# Patient Record
Sex: Male | Born: 1950
Health system: Southern US, Community
[De-identification: ages and names within clinical notes are randomized; demographics above are authoritative.]

## PROBLEM LIST (undated history)

## (undated) DIAGNOSIS — E669 Obesity, unspecified: Secondary | ICD-10-CM

## (undated) DIAGNOSIS — M1990 Unspecified osteoarthritis, unspecified site: Secondary | ICD-10-CM

## (undated) DIAGNOSIS — M549 Dorsalgia, unspecified: Secondary | ICD-10-CM

## (undated) DIAGNOSIS — R12 Heartburn: Secondary | ICD-10-CM

## (undated) DIAGNOSIS — G4733 Obstructive sleep apnea (adult) (pediatric): Secondary | ICD-10-CM

## (undated) DIAGNOSIS — R079 Chest pain, unspecified: Secondary | ICD-10-CM

## (undated) DIAGNOSIS — M2550 Pain in unspecified joint: Secondary | ICD-10-CM

## (undated) DIAGNOSIS — J189 Pneumonia, unspecified organism: Secondary | ICD-10-CM

## (undated) DIAGNOSIS — K429 Umbilical hernia without obstruction or gangrene: Secondary | ICD-10-CM

## (undated) DIAGNOSIS — I480 Paroxysmal atrial fibrillation: Secondary | ICD-10-CM

## (undated) DIAGNOSIS — N400 Enlarged prostate without lower urinary tract symptoms: Secondary | ICD-10-CM

## (undated) DIAGNOSIS — Z86010 Personal history of colonic polyps: Secondary | ICD-10-CM

## (undated) DIAGNOSIS — R7303 Prediabetes: Secondary | ICD-10-CM

## (undated) DIAGNOSIS — E785 Hyperlipidemia, unspecified: Secondary | ICD-10-CM

## (undated) DIAGNOSIS — R2689 Other abnormalities of gait and mobility: Secondary | ICD-10-CM

## (undated) DIAGNOSIS — I2510 Atherosclerotic heart disease of native coronary artery without angina pectoris: Secondary | ICD-10-CM

## (undated) DIAGNOSIS — Z87442 Personal history of urinary calculi: Secondary | ICD-10-CM

## (undated) DIAGNOSIS — R0602 Shortness of breath: Secondary | ICD-10-CM

## (undated) DIAGNOSIS — I10 Essential (primary) hypertension: Secondary | ICD-10-CM

## (undated) DIAGNOSIS — K219 Gastro-esophageal reflux disease without esophagitis: Secondary | ICD-10-CM

## (undated) DIAGNOSIS — R5383 Other fatigue: Secondary | ICD-10-CM

## (undated) DIAGNOSIS — K5900 Constipation, unspecified: Secondary | ICD-10-CM

## (undated) DIAGNOSIS — E039 Hypothyroidism, unspecified: Secondary | ICD-10-CM

## (undated) DIAGNOSIS — K439 Ventral hernia without obstruction or gangrene: Secondary | ICD-10-CM

## (undated) HISTORY — DX: Other abnormalities of gait and mobility: R26.89

## (undated) HISTORY — DX: Dorsalgia, unspecified: M54.9

## (undated) HISTORY — DX: Pain in unspecified joint: M25.50

## (undated) HISTORY — DX: Other fatigue: R53.83

## (undated) HISTORY — DX: Essential (primary) hypertension: I10

## (undated) HISTORY — DX: Chest pain, unspecified: R07.9

## (undated) HISTORY — PX: CARDIAC CATHETERIZATION: SHX172

## (undated) HISTORY — PX: TONSILLECTOMY: SUR1361

## (undated) HISTORY — DX: Constipation, unspecified: K59.00

## (undated) HISTORY — DX: Paroxysmal atrial fibrillation: I48.0

## (undated) HISTORY — PX: OTHER SURGICAL HISTORY: SHX169

## (undated) HISTORY — DX: Benign prostatic hyperplasia without lower urinary tract symptoms: N40.0

## (undated) HISTORY — DX: Obesity, unspecified: E66.9

## (undated) HISTORY — DX: Unspecified osteoarthritis, unspecified site: M19.90

## (undated) HISTORY — DX: Hypothyroidism, unspecified: E03.9

## (undated) HISTORY — PX: WISDOM TOOTH EXTRACTION: SHX21

## (undated) HISTORY — DX: Shortness of breath: R06.02

## (undated) HISTORY — DX: Personal history of colonic polyps: Z86.010

---

## 1988-12-29 HISTORY — PX: TONSILLECTOMY: SHX5217

## 1989-12-29 HISTORY — PX: LAMINECTOMY: SHX219

## 2005-12-29 DIAGNOSIS — Z860100 Personal history of colon polyps, unspecified: Secondary | ICD-10-CM

## 2005-12-29 HISTORY — DX: Personal history of colonic polyps: Z86.010

## 2005-12-29 HISTORY — DX: Personal history of colon polyps, unspecified: Z86.0100

## 2005-12-29 LAB — HM COLONOSCOPY: HM Colonoscopy: NORMAL

## 2009-09-04 ENCOUNTER — Ambulatory Visit: Payer: Self-pay | Admitting: Internal Medicine

## 2009-09-04 DIAGNOSIS — I10 Essential (primary) hypertension: Secondary | ICD-10-CM | POA: Insufficient documentation

## 2009-09-04 DIAGNOSIS — Z860100 Personal history of colon polyps, unspecified: Secondary | ICD-10-CM | POA: Insufficient documentation

## 2009-09-04 DIAGNOSIS — R339 Retention of urine, unspecified: Secondary | ICD-10-CM | POA: Insufficient documentation

## 2009-09-04 DIAGNOSIS — E039 Hypothyroidism, unspecified: Secondary | ICD-10-CM | POA: Insufficient documentation

## 2009-09-04 DIAGNOSIS — E785 Hyperlipidemia, unspecified: Secondary | ICD-10-CM | POA: Insufficient documentation

## 2009-09-04 DIAGNOSIS — E669 Obesity, unspecified: Secondary | ICD-10-CM | POA: Insufficient documentation

## 2009-09-04 DIAGNOSIS — M129 Arthropathy, unspecified: Secondary | ICD-10-CM | POA: Insufficient documentation

## 2009-09-05 ENCOUNTER — Encounter (INDEPENDENT_AMBULATORY_CARE_PROVIDER_SITE_OTHER): Payer: Self-pay | Admitting: *Deleted

## 2009-09-05 LAB — CONVERTED CEMR LAB
Bilirubin Urine: NEGATIVE
Hemoglobin, Urine: NEGATIVE
Ketones, ur: NEGATIVE mg/dL
Leukocytes, UA: NEGATIVE
Nitrite: NEGATIVE
PSA: 2.07 ng/mL (ref 0.10–4.00)
Specific Gravity, Urine: >=1.03 (ref 1.000–1.030)
TSH: 3.6 microintl units/mL (ref 0.35–5.50)
Total Protein, Urine: NEGATIVE mg/dL
Urine Glucose: NEGATIVE mg/dL
Urobilinogen, UA: 0.2 (ref 0.0–1.0)
pH: 5.5 (ref 5.0–8.0)

## 2009-11-20 ENCOUNTER — Ambulatory Visit (HOSPITAL_BASED_OUTPATIENT_CLINIC_OR_DEPARTMENT_OTHER): Admission: RE | Admit: 2009-11-20 | Discharge: 2009-11-20 | Payer: Self-pay | Admitting: Orthopaedic Surgery

## 2010-04-29 ENCOUNTER — Ambulatory Visit: Payer: Self-pay | Admitting: Internal Medicine

## 2010-04-29 LAB — CONVERTED CEMR LAB
ALT: 28 units/L (ref 0–53)
AST: 24 units/L (ref 0–37)
Albumin: 4.3 g/dL (ref 3.5–5.2)
Alkaline Phosphatase: 65 units/L (ref 39–117)
BUN: 13 mg/dL (ref 6–23)
Basophils Absolute: 0 10*3/uL (ref 0.0–0.1)
Basophils Relative: 0.8 % (ref 0.0–3.0)
Bilirubin Urine: NEGATIVE
Bilirubin, Direct: 0.1 mg/dL (ref 0.0–0.3)
CO2: 27 meq/L (ref 19–32)
Calcium: 9.7 mg/dL (ref 8.4–10.5)
Chloride: 107 meq/L (ref 96–112)
Cholesterol: 210 mg/dL — ABNORMAL HIGH (ref 0–200)
Creatinine, Ser: 0.8 mg/dL (ref 0.4–1.5)
Direct LDL: 154.7 mg/dL
Eosinophils Absolute: 0.1 10*3/uL (ref 0.0–0.7)
Eosinophils Relative: 2.5 % (ref 0.0–5.0)
GFR calc non Af Amer: 105.96 mL/min (ref >60)
Glucose, Bld: 95 mg/dL (ref 70–99)
HCT: 42.1 % (ref 39.0–52.0)
HDL: 42.5 mg/dL (ref >39.00)
Hemoglobin, Urine: NEGATIVE
Hemoglobin: 15 g/dL (ref 13.0–17.0)
Ketones, ur: NEGATIVE mg/dL
Leukocytes, UA: NEGATIVE
Lipase: 24 units/L (ref 11.0–59.0)
Lymphocytes Relative: 34.6 % (ref 12.0–46.0)
Lymphs Abs: 1.7 10*3/uL (ref 0.7–4.0)
MCHC: 35.7 g/dL (ref 30.0–36.0)
MCV: 89.2 fL (ref 78.0–100.0)
Monocytes Absolute: 0.5 10*3/uL (ref 0.1–1.0)
Monocytes Relative: 10.7 % (ref 3.0–12.0)
Neutro Abs: 2.5 10*3/uL (ref 1.4–7.7)
Neutrophils Relative %: 51.4 % (ref 43.0–77.0)
Nitrite: NEGATIVE
Platelets: 220 10*3/uL (ref 150.0–400.0)
Potassium: 4.2 meq/L (ref 3.5–5.1)
RBC: 4.72 M/uL (ref 4.22–5.81)
RDW: 12.4 % (ref 11.5–14.6)
Sodium: 141 meq/L (ref 135–145)
Specific Gravity, Urine: 1.025 (ref 1.000–1.030)
TSH: 3.82 microintl units/mL (ref 0.35–5.50)
Total Bilirubin: 0.9 mg/dL (ref 0.3–1.2)
Total CHOL/HDL Ratio: 5
Total Protein, Urine: NEGATIVE mg/dL
Total Protein: 6.7 g/dL (ref 6.0–8.3)
Triglycerides: 67 mg/dL (ref 0.0–149.0)
Urine Glucose: NEGATIVE mg/dL
Urobilinogen, UA: 0.2 (ref 0.0–1.0)
VLDL: 13.4 mg/dL (ref 0.0–40.0)
WBC: 5 10*3/uL (ref 4.5–10.5)
pH: 5 (ref 5.0–8.0)

## 2010-05-24 ENCOUNTER — Telehealth: Payer: Self-pay | Admitting: Internal Medicine

## 2010-10-04 ENCOUNTER — Telehealth: Payer: Self-pay | Admitting: Internal Medicine

## 2010-11-27 ENCOUNTER — Telehealth: Payer: Self-pay | Admitting: Internal Medicine

## 2010-12-09 ENCOUNTER — Encounter: Payer: Self-pay | Admitting: Internal Medicine

## 2010-12-09 ENCOUNTER — Ambulatory Visit: Payer: Self-pay | Admitting: Internal Medicine

## 2011-01-22 LAB — URINALYSIS, ROUTINE W REFLEX MICROSCOPIC
Bilirubin Urine: NEGATIVE
Hgb urine dipstick: NEGATIVE
Ketones, ur: NEGATIVE mg/dL
Nitrite: NEGATIVE
Protein, ur: NEGATIVE mg/dL
Specific Gravity, Urine: 1.025 (ref 1.005–1.030)
Urine Glucose, Fasting: NEGATIVE mg/dL
Urobilinogen, UA: 1 mg/dL (ref 0.0–1.0)
pH: 6 (ref 5.0–8.0)

## 2011-01-22 LAB — COMPREHENSIVE METABOLIC PANEL
ALT: 36 U/L (ref 0–53)
AST: 29 U/L (ref 0–37)
Albumin: 4 g/dL (ref 3.5–5.2)
Alkaline Phosphatase: 67 U/L (ref 39–117)
BUN: 14 mg/dL (ref 6–23)
CO2: 26 mEq/L (ref 19–32)
Calcium: 9.5 mg/dL (ref 8.4–10.5)
Chloride: 110 mEq/L (ref 96–112)
Creatinine, Ser: 0.84 mg/dL (ref 0.4–1.5)
GFR calc Af Amer: >60 mL/min (ref >60)
GFR calc non Af Amer: >60 mL/min (ref >60)
Glucose, Bld: 88 mg/dL (ref 70–99)
Potassium: 3.8 mEq/L (ref 3.5–5.1)
Sodium: 143 mEq/L (ref 135–145)
Total Bilirubin: 0.5 mg/dL (ref 0.3–1.2)
Total Protein: 6.9 g/dL (ref 6.0–8.3)

## 2011-01-22 LAB — SURGICAL PCR SCREEN
MRSA, PCR: NEGATIVE
Staphylococcus aureus: NEGATIVE

## 2011-01-22 LAB — APTT: aPTT: 39 seconds — ABNORMAL HIGH (ref 24–37)

## 2011-01-22 LAB — CBC
HCT: 42.2 % (ref 39.0–52.0)
Hemoglobin: 15.1 g/dL (ref 13.0–17.0)
MCH: 31.3 pg (ref 26.0–34.0)
MCHC: 35.8 g/dL (ref 30.0–36.0)
MCV: 87.4 fL (ref 78.0–100.0)
Platelets: 166 10*3/uL (ref 150–400)
RBC: 4.83 MIL/uL (ref 4.22–5.81)
RDW: 12.1 % (ref 11.5–15.5)
WBC: 5.6 10*3/uL (ref 4.0–10.5)

## 2011-01-22 LAB — PROTIME-INR
INR: 1.09 (ref 0.00–1.49)
Prothrombin Time: 14.3 seconds (ref 11.6–15.2)

## 2011-01-28 NOTE — Progress Notes (Signed)
Summary: levothyroxine  Phone Note Refill Request Message from:  Fax from Pharmacy on October 04, 2010 1:49 PM  Refills Requested: Medication #1:  LEVOTHYROXINE SODIUM 125 MCG TABS take 1 by mouth qd   Last Refilled: 07/22/2010 Pt is requesting 90 days due to insurance. Ps send to [facility]  Initial call taken by: Orlan Leavens RMA,  October 04, 2010 1:50 PM    Prescriptions: LEVOTHYROXINE SODIUM 125 MCG TABS (LEVOTHYROXINE SODIUM) take 1 by mouth qd  #90 x 1   Entered by:   Orlan Leavens RMA   Authorized by:   Newt Lukes MD   Signed by:   Orlan Leavens RMA on 10/04/2010   Method used:   Electronically to        [facility]* (retail)       [address]       [address] Shipping/mailing       [place], [place]  [address]       Ph: [phone]       Fax: [phone]   RxID:   [id number]

## 2011-01-28 NOTE — Assessment & Plan Note (Signed)
Summary: f/u appt per wife,not cpx/#/cd   Vital Signs:  Patient profile:   60 year old male Height:      72 inches (182.88 cm) Weight:      259.8 pounds (118.09 kg) O2 Sat:      96 % on Room air Temp:     98.3 degrees F (36.83 degrees C) oral Pulse rate:   62 / minute BP sitting:   126 / 84  (left arm) Cuff size:   large  Vitals Entered By: Orlan Leavens (Apr 29, 2010 9:55 AM)  O2 Flow:  Room air CC: CPX/follow-up visit/ last weekend had some abdominal pain x's 48 hours, Abdominal pain Is Patient Diabetic? No Pain Assessment Patient in pain? no        Primary Care Provider:  Newt Lukes MD  CC:  CPX/follow-up visit/ last weekend had some abdominal pain x's 48 hours and Abdominal pain.  History of Present Illness: patient is here today for annual physical. Patient feels well and has no complaints today -  Abdominal Pain      This is a 60 year old man who presents with Abdominal pain.  The symptoms began 1 week ago and lasted 48 hours before resolution.  On a scale of 1 to 10, the intensity is described as a 7 at worst, currently no pain at all.  onset while at tailgating party last weekend, sudden onset without warning - no hx similar symptoms or GERD.  The patient denies nausea, vomiting, diarrhea, constipation, melena, hematochezia, anorexia, and hematemesis.  The location of the pain is epigastric.  The pain is described as constant, sharp, and radiating to the back.  The patient denies the following symptoms: fever, weight loss, dysuria, and chest pain.  The pain is worse with food and movement.  The pain is not better with antacids or aspirin use.    also asks ?celebrex to help with arthritis pain-  pain located in knees and back -  uses occ tylenol or alleve -  most help with relief from ibuprofen when pain is bad/flared up  Preventive Screening-Counseling & Management  Alcohol-Tobacco     Alcohol drinks/day: <1     Alcohol Counseling: not indicated; use of  alcohol is not excessive or problematic     Smoking Status: never  Caffeine-Diet-Exercise     Exercise Counseling: to improve exercise regimen     Depression Counseling: not indicated; screening negative for depression  Safety-Violence-Falls     Seat Belt Counseling: not indicated; patient wears seat belts     Helmet Counseling: not applicable     Firearm Counseling: not applicable     Smoke Detector Counseling: no     Violence Counseling: not indicated; no violence risk noted     Fall Risk Counseling: not indicated; no significant falls noted  Clinical Review Panels:  Prevention   Last Colonoscopy:  Results: Normal. (12/29/2005)   Last PSA:  2.07 (09/04/2009)  Immunizations   Last Tetanus Booster:  Td (09/04/2009)   Current Medications (verified): 1)  Levothyroxine Sodium 125 Mcg Tabs (Levothyroxine Sodium) .... Take 1 By Mouth Qd 2)  Aspirin 81 Mg Tabs (Aspirin) .... Take 1 By Mouth Qd 3)  Vitamin D3 1000 Unit Tabs (Cholecalciferol) .... Take 1 By Mouth Qd 4)  Fish Oil 1250 Mg Caps (Omega-3 Fatty Acids) .... Take 1 By Mouth Qd 5)  Multivitamins  Tabs (Multiple Vitamin) .... Take 1 Po Qd  Allergies (verified): No Known Drug Allergies  Past History:  Past medical, surgical, family and social histories (including risk factors) reviewed, and no changes noted (except as noted below).  Past Medical History: Colonic polyps, hx of - (2007 - rec repeat colo 2012 by [facility]) Hypertension hypothyroid dyslipidemia arthritis, knees  MD rooster: ortho - daldorf  Past Surgical History: Tonsillectomy (2004) laminectomy (1991) Arthroscopic knee surgery (R -2003, L - 2010)  Family History: Reviewed history from 09/04/2009 and no changes required. Family History of Arthritis (grandparent & parents) Family History Diabetes 1st degree relative (parents) Family History Hypertension (parents) Heart disease(parent & grandparent) ALS (m grandfather) mother living, age 29, with  advanced dementia for age Dad living in his 62s, status post CABG in his 66s  Social History: Reviewed history from 09/04/2009 and no changes required. 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 Rare, social alcohol use    Review of Systems       see HPI above. I have reviewed all other systems and they were negative.   Physical Exam  General:  overweight-appearing.  alert, well-developed, well-nourished, and cooperative to examination.  wife at side  Eyes:  vision grossly intact; pupils equal, round and reactive to light.  conjunctiva and lids normal.    Ears:  normal pinnae bilaterally, without erythema, swelling, or tenderness to palpation. TMs clear, without effusion, or cerumen impaction. Hearing grossly normal bilaterally  Mouth:  teeth and gums in good repair; mucous membranes moist, without lesions or ulcers. oropharynx clear without exudate, no erythema.  Neck:  supple, full ROM, no masses, no thyromegaly; no thyroid nodules or tenderness. no JVD or carotid bruits.   Lungs:  normal respiratory effort, no intercostal retractions or use of accessory muscles; normal breath sounds bilaterally - no crackles and no wheezes.    Heart:  normal rate, regular rhythm, no murmur, and no rub. BLE without edema. normal DP pulses and normal cap refill in all 4 extremities    Abdomen:  protuberant, soft, non-tender, normal bowel sounds, no distention; no masses and no appreciable hepatomegaly or splenomegaly.   Rectal:  defer Msk:  no gross deformities or joint effusions  Neurologic:  alert & oriented X3 and cranial nerves II-XII symetrically intact.  strength normal in all extremities, sensation intact to light touch, and gait normal. speech fluent without dysarthria or aphasia; follows commands with good comprehension.  Skin:  tanned, sunburned rash to abd and back - no other rashes, vesicles, ulcers, or  erythema. No nodules or irregularity to palpation.  no shingles or bruising on abd or back Cervical Nodes:  No lymphadenopathy noted Axillary Nodes:  No palpable lymphadenopathy Psych:  Oriented X3, memory intact for recent and remote, normally interactive, good eye contact, not anxious appearing, not depressed appearing, and not agitated.      Impression & Recommendations:  Problem # 1:  PHYSICAL EXAMINATION (ICD-V70.0) Patient has been counseled on age-appropriate routine health concerns for screening and prevention. These are reviewed and up-to-date. Immunizations are up-to-date or declined. Labs ordered and ECG reviewed.  Orders: EKG w/ Interpretation (93000) TLB-Lipid Panel (80061-LIPID) TLB-BMP (Basic Metabolic Panel-BMET) (80048-METABOL) TLB-CBC Platelet - w/Differential (85025-CBCD) TLB-Hepatic/Liver Function Pnl (80076-HEPATIC) TLB-TSH (Thyroid Stimulating Hormone) (84443-TSH) TLB-Udip w/ Micro (81001-URINE)  Problem # 2:  ABDOMINAL PAIN (ICD-789.00) none at this time - exam benign EKG w/o ischemic changes (done for CPX as above) - will look at labs as above for CPX and include lipase r/o panc  flare if labs normal, as pain resolved, try OTC PPI x 2 weeks - if abn labs or recurrent pain, pursue further imagaing as needed  Orders: TLB-Lipase (83690-LIPASE)  Problem # 3:  ARTHRITIS (ICD-716.90)  ok to use celebrex as needed - new e-rx done  Orders: Prescription Created Electronically [phone])  Problem # 4:  HYPOTHYROIDISM (ICD-244.9)  His updated medication list for this problem includes:    Levothyroxine Sodium 125 Mcg Tabs (Levothyroxine sodium) .Marland Kitchen... Take 1 by mouth qd  Labs Reviewed: TSH: 3.60 (09/04/2009)     Problem # 5:  HYPERTENSION (ICD-401.9)  BP today: 126/84 Prior BP: 144/90 (09/04/2009)  Problem # 6:  DYSLIPIDEMIA (ICD-272.4)  Complete Medication List: 1)  Levothyroxine Sodium 125 Mcg Tabs (Levothyroxine sodium) .... Take 1 by mouth qd 2)  Aspirin  81 Mg Tabs (Aspirin) .... Take 1 by mouth qd 3)  Vitamin D3 1000 Unit Tabs (Cholecalciferol) .... Take 1 by mouth qd 4)  Fish Oil 1200 Mg Caps (Omega-3 fatty acids) .Marland Kitchen.. 1 by mouth once daily 5)  Multivitamins Tabs (Multiple vitamin) .... Take 1 po qd 6)  Celebrex 200 Mg Caps (Celecoxib) .Marland Kitchen.. 1 by mouth once daily 7)  Prilosec Otc 20 Mg Tbec (Omeprazole magnesium) .Marland Kitchen.. 1 by mouth once daily x 2 weeks, then as needed  Patient Instructions: 1)  it was good to see you today. 2)  EKG does not show any heart problems - 3)  test(s) ordered today - your results will be posted on the phone tree for review in 48-72 hours from the time of test completion; call [phone] and enter your 9 digit MRN (listed above on this page, just below your name); if any changes need to be made or there are abnormal results, you will be contacted directly.  4)  If labs are normal, will treat for probable stomach cause of previous pain symptoms with Prilosec OTC once daily x 2 weeks, then as needed (as listed below) - If any recurrent abdominal pain, call us for further testing as needed (or we will notify you if further testing indicated by any lab abnormality) 5)  try celebrex for arthritis pain as discussed - your prescription has been electronically submitted to your pharmacy. Please take as directed. Contact our office if you believe you're having problems with the medication(s).  Prescriptions: CELEBREX 200 MG CAPS (CELECOXIB) 1 by mouth once daily  #30 x 2   Entered and Authorized by:   Newt Lukes MD   Signed by:   Newt Lukes MD on 04/29/2010   Method used:   Electronically to        [facility]* (retail)       [address]       [address] Shipping/mailing       [place], [place]  [address]       Ph: [phone]       Fax: [phone]   RxID:   [id number]

## 2011-01-28 NOTE — Letter (Signed)
Summary: Results Follow-up Letter  [facility]  [address] [place], [place] [address]   Phone: [phone]  Fax: [phone]    09/05/2009  [address] [place], [place]  [address]  Dear Mr. HAVERSTOCK,   The following are the results of your recent test 09/04/09  Test     Result     TSH               Normal PSA               Normal Urine             Normal No obvious prostate, kidney, or thyroid problems here if continued symtoms with urination will refer you to Urologist for further evaluation. Attached a copy of your bloodwork for your records.    Sincerely,  Westley Hummer  [facility]

## 2011-01-28 NOTE — Progress Notes (Signed)
Summary: med change  Phone Note From Pharmacy   Caller: [facility]* Call For: MD  Summary of Call: Have rx for celebrex 200mg . pt want to know if md could switch to mobic which comes in a generic. Pls advise ? Initial call taken by: Orlan Leavens,  May 24, 2010 3:14 PM  Follow-up for Phone Call        ok- e-rx done Follow-up by: Newt Lukes MD,  May 24, 2010 3:39 PM    New/Updated Medications: MELOXICAM 15 MG TABS (MELOXICAM) 1 by mouth once daily as needed for pain Prescriptions: MELOXICAM 15 MG TABS (MELOXICAM) 1 by mouth once daily as needed for pain  #30 x 3   Entered and Authorized by:   Newt Lukes MD   Signed by:   Newt Lukes MD on 05/24/2010   Method used:   Electronically to        [facility]* (retail)       [address]       [address] Shipping/mailing       [place], [place]  [address]       Ph: [phone]       Fax: [phone]   RxID:   [id number]

## 2011-01-28 NOTE — Progress Notes (Signed)
Summary: 90 day meloxicam  Phone Note From Pharmacy   Caller: [facility]* Call For: Dr. Felicity Coyer  Request: Resend Prescription Summary of Call: Recieved 30 day supply on meloxicam. Insurance requires 90. Pls send new rx. Sent to pharm Initial call taken by: Orlan Leavens RMA,  November 27, 2010 10:42 AM    Prescriptions: MELOXICAM 15 MG TABS (MELOXICAM) 1 by mouth once daily as needed for pain  #90 x 0   Entered by:   Orlan Leavens RMA   Authorized by:   Newt Lukes MD   Signed by:   Orlan Leavens RMA on 11/27/2010   Method used:   Electronically to        [facility]* (retail)       [address]       [address] Shipping/mailing       [place], [place]  [address]       Ph: [phone]       Fax: [phone]   RxID:   [id number]

## 2011-01-28 NOTE — Assessment & Plan Note (Signed)
Summary: NEW/ UMR / NWS  $50   Vital Signs:  Patient profile:   60 year old male Height:      72 inches (182.88 cm) Weight:      249.8 pounds (113.55 kg) BMI:     34.00 O2 Sat:      97 % Temp:     98.7 degrees F (37.06 degrees C) oral Pulse rate:   69 / minute BP sitting:   144 / 90  (left arm) Cuff size:   large  Vitals Entered By: Orlan Leavens (September 04, 2009 3:13 PM) CC: New patient Is Patient Diabetic? No Pain Assessment Patient in pain? no        Primary Care Provider:  Newt Lukes MD  CC:  New patient.  History of Present Illness: here today to establish care Has moved to the [place] area from [place] in June 2010 to be closer to grand kids and family Patient intake form reviewed but no old records available today  Hypothyroidism. Reports he is needing refills soon on his medication.  Last TSH approximally 5 months ago, normal by his report no recent dose changes in past year, but increased from 100-125 approximately one year ago No hair or skin changes, weight stable  Complains of very occasional urinary hesitation Symptoms occur once every 2-3 weeks Associated with straining and difficulty passing urine, but no pain within penis, bladder, or abdomen No hematuria No history of known prostate problems and reports normal. PSA in the past No fever or dysuria Does not feel troubled by the symptoms on a regular basis  Reports history of borderline high cholesterol and borderline diabetes, but never formally diagnosed or on medications for same. Also reports "borderline high blood pressure" but never on medication for same  Preventive Screening-Counseling & Management  Alcohol-Tobacco     Smoking Status: never  Clinical Review Panels:  Immunizations   Last Tetanus Booster:  Td (09/04/2009)   Current Medications (verified): 1)  Levothyroxine Sodium 125 Mcg Tabs (Levothyroxine Sodium) .... Take 1 By Mouth Qd 2)  Aspirin 81 Mg Tabs  (Aspirin) .... Take 1 By Mouth Qd 3)  Vitamin D3 1000 Unit Tabs (Cholecalciferol) .... Take 1 By Mouth Qd 4)  Fish Oil 1000 Mg Caps (Omega-3 Fatty Acids) .... Take 1 By Mouth Qd 5)  Multivitamins  Tabs (Multiple Vitamin) .... Take 1 Po Qd  Allergies (verified): No Known Drug Allergies  Past History:  Past Medical History: Colonic polyps, hx of - (2007 - rec repeat colo 2012 by [facility]) Hypertension hypothyroid dyslipidemia  Past Surgical History: Tonsillectomy (2004) laminectomy (1991) Arthroscopic knee surgery (2003)  Family History: Family History of Arthritis (grandparent & parents) Family History Diabetes 1st degree relative (parents) Family History Hypertension (parents) Heart disease(parent & grandparent) ALS (m grandfather) mother living, age 21, with advanced dementia for age Dad living in his 17s, status post CABG in his 49s  Social History: 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 Rare, social alcohol use Smoking Status:  never  Review of Systems       see HPI above. I have reviewed all other systems and they were negative.   Physical Exam  General:  overweight-appearing.  alert, well-developed, well-nourished, and cooperative to examination.    Eyes:  vision grossly intact; pupils equal, round and reactive to light.  conjunctiva and lids normal.    Lungs:  normal respiratory  effort, no intercostal retractions or use of accessory muscles; normal breath sounds bilaterally - no crackles and no wheezes.    Heart:  normal rate, regular rhythm, no murmur, and no rub. BLE without edema.  Abdomen:  protuberant, soft, non-tender, normal bowel sounds, no distention; no masses and no appreciable hepatomegaly or splenomegaly.   Prostate:  defer Msk:  no gross deformities or joint effusions Neurologic:  alert & oriented X3 and cranial nerves II-XII symetrically intact.  strength normal in  all extremities, sensation intact to light touch, and gait normal. speech fluent without dysarthria or aphasia; follows commands with good comprehension.  Skin:  diffuse ruddy/tan coloration of skin -no specific petechia or rash ulceration, or bruising Psych:  Oriented X3, memory intact for recent and remote, normally interactive, good eye contact, not anxious appearing, not depressed appearing, and not agitated.      Impression & Recommendations:  Problem # 1:  HYPOTHYROIDISM (ICD-244.9)  plan to continue medications as ongoing if TSH normal Check labs today and send refills His updated medication list for this problem includes:    Levothyroxine Sodium 125 Mcg Tabs (Levothyroxine sodium) .Marland Kitchen... Take 1 by mouth qd  Orders: TLB-TSH (Thyroid Stimulating Hormone) 409 120 [id number]) Prescription Created Electronically [phone])  Problem # 2:  INCOMPLETE BLADDER EMPTYING (ICD-788.21) occasional symptomatic urinary complaints, with possible BPH Check urine to rule out infection, and PSA If either are abnormal, consider urology eval  or if increasing, worsening symptoms will refer sooner than later Will hold off on emperic treatment of BPH at this time pending these results Orders: TLB-Udip w/ Micro (81001-URINE) TLB-PSA (Prostate Specific Antigen) (84153-PSA)  Problem # 3:  HYPERTENSION (ICD-401.9) history of borderline high blood pressure per patient report Will plan to recheck this value and begin treatment if remains elevated next visit Check physical, labs and await results of old records from prior PCP Recommend exercise, weight control, and diet BP today: 144/90  Problem # 4:  DYSLIPIDEMIA (ICD-272.4) takes omega-3 supplements, but never on prescription meds Plan recheck fasting lipid at next physical appointment  Problem # 5:  OBESITY (ICD-278.00) discussed with patient need to watch weight and exercise, especially in retirement Patient verbalizes understanding and agrees Ht: 72  (09/04/2009)   Wt: 249.8 (09/04/2009)   BMI: 34.00 (09/04/2009)  Complete Medication List: 1)  Levothyroxine Sodium 125 Mcg Tabs (Levothyroxine sodium) .... Take 1 by mouth qd 2)  Aspirin 81 Mg Tabs (Aspirin) .... Take 1 by mouth qd 3)  Vitamin D3 1000 Unit Tabs (Cholecalciferol) .... Take 1 by mouth qd 4)  Fish Oil 1000 Mg Caps (Omega-3 fatty acids) .... Take 1 by mouth qd 5)  Multivitamins Tabs (Multiple vitamin) .... Take 1 po qd  Other Orders: Tetanus Toxoid w/Dx (78295) Admin 1st Vaccine (62130)  Patient Instructions: 1)  will check your TSH (thyroid marker) and PSA + urine today - if these are normal, we will mail your results -  if abnormal, we will contact you with plans for other testing or treatment 2)  refills on thyroid medication send electronically to your pharmacy - call if problems 3)  Please schedule a follow-up appointment in 6 months, fasting for physical lab work. Also welcome to call sooner if any new problems arise 4)  Limit your Sodium (Salt). 5)  It is important that you exercise regularly at least 20 minutes 5 times a week. If you develop chest pain, have severe difficulty breathing, or feel very tired , stop exercising immediately and seek medical attention. 6)  You need to lose weight. Consider a lower calorie diet and regular exercise.  Prescriptions: LEVOTHYROXINE SODIUM 125 MCG TABS (LEVOTHYROXINE SODIUM) take 1 by mouth qd  #30 x 5   Entered and Authorized by:   Newt Lukes MD   Signed by:   Newt Lukes MD on 09/04/2009   Method used:   Electronically to        [facility]* (retail)       [address]       [address] Shipping/mailing       [place], [place]  [address]       Ph: [phone]       Fax: [phone]   RxID:   [id number]    Colonoscopy  Procedure date:  12/29/2005  Findings:      Results: Normal.     Immunizations Administered:  Tetanus Vaccine:    Vaccine Type: Td    Site: left  deltoid    Mfr: [employer] Pasteur    Dose: 0.5 ml    Route: IM    Given by: Orlan Leavens    Exp. Date: 04/11/2011    Lot #: [id number]    VIS given: 09/04/09

## 2011-01-29 ENCOUNTER — Inpatient Hospital Stay (HOSPITAL_COMMUNITY): Payer: 59

## 2011-01-29 ENCOUNTER — Inpatient Hospital Stay (HOSPITAL_COMMUNITY)
Admission: RE | Admit: 2011-01-29 | Discharge: 2011-02-01 | DRG: 470 | Disposition: A | Discharge status: Home Health | Payer: 59 | Attending: Orthopedic Surgery | Admitting: Orthopedic Surgery

## 2011-01-29 DIAGNOSIS — E039 Hypothyroidism, unspecified: Secondary | ICD-10-CM | POA: Diagnosis present

## 2011-01-29 DIAGNOSIS — M169 Osteoarthritis of hip, unspecified: Principal | ICD-10-CM | POA: Diagnosis present

## 2011-01-29 DIAGNOSIS — M1610 Unilateral primary osteoarthritis, unspecified hip: Principal | ICD-10-CM | POA: Diagnosis present

## 2011-01-29 HISTORY — PX: TOTAL HIP ARTHROPLASTY: SHX124

## 2011-01-29 LAB — ABO/RH: ABO/RH(D): O POS

## 2011-01-29 LAB — TYPE AND SCREEN
ABO/RH(D): O POS
Antibody Screen: NEGATIVE

## 2011-01-30 LAB — CBC
HCT: 40.2 % (ref 39.0–52.0)
Hemoglobin: 13.9 g/dL (ref 13.0–17.0)
MCH: 30.7 pg (ref 26.0–34.0)
MCHC: 34.6 g/dL (ref 30.0–36.0)
MCV: 88.7 fL (ref 78.0–100.0)
Platelets: 168 10*3/uL (ref 150–400)
RBC: 4.53 MIL/uL (ref 4.22–5.81)
RDW: 12.3 % (ref 11.5–15.5)
WBC: 10.9 10*3/uL — ABNORMAL HIGH (ref 4.0–10.5)

## 2011-01-30 LAB — BASIC METABOLIC PANEL
BUN: 9 mg/dL (ref 6–23)
CO2: 25 mEq/L (ref 19–32)
Calcium: 8.6 mg/dL (ref 8.4–10.5)
Chloride: 102 mEq/L (ref 96–112)
Creatinine, Ser: 0.91 mg/dL (ref 0.4–1.5)
GFR calc Af Amer: >60 mL/min (ref >60)
GFR calc non Af Amer: >60 mL/min (ref >60)
Glucose, Bld: 167 mg/dL — ABNORMAL HIGH (ref 70–99)
Potassium: 4.5 mEq/L (ref 3.5–5.1)
Sodium: 136 mEq/L (ref 135–145)

## 2011-01-30 NOTE — Letter (Signed)
Summary: Surgical clearance/[facility]  Surgical clearance/[facility]   Imported By: Lester [place] 12/13/2010 07:14:46  _____________________________________________________________________  External Attachment:    Type:   Image     Comment:   External Document

## 2011-01-30 NOTE — Assessment & Plan Note (Signed)
Summary: pre-operative clearance/surgery in Feb/#/cd   Vital Signs:  Patient profile:   60 year old male Height:      72 inches (182.88 cm) Weight:      251.0 pounds (114.09 kg) BMI:     34.16 O2 Sat:      96 % on Room air Temp:     98.5 degrees F (36.94 degrees C) oral Pulse rate:   75 / minute BP sitting:   124 / 78  (left arm) Cuff size:   large  Vitals Entered By: Orlan Leavens RMA (December 09, 2010 11:09 AM)  O2 Flow:  Room air CC: Medical clearance Is Patient Diabetic? No Pain Assessment Patient in pain? no        Primary Care Provider:  Newt Lukes MD  CC:  Medical clearance.  History of Present Illness: patient is here today for preop clearance (requested by dr. Berton Lan)  planning THA for R hip DJD - sched 01/29/11 no CP or known CAD hx - no anginal symptoms  no snoring or sleep apnea known no DOE or dizziness/syncope no anesthesia problems with prior surg in past   Clinical Review Panels:  Prevention   Last Colonoscopy:  Results: Normal. (12/29/2005)   Last PSA:  2.07 (09/04/2009)  Immunizations   Last Tetanus Booster:  Td (09/04/2009)  Lipid Management   Cholesterol:  210 (04/29/2010)   HDL (good cholesterol):  42.50 (04/29/2010)  CBC   WBC:  5.0 (04/29/2010)   RBC:  4.72 (04/29/2010)   Hgb:  15.0 (04/29/2010)   Hct:  42.1 (04/29/2010)   Platelets:  220.0 (04/29/2010)   MCV  89.2 (04/29/2010)   MCHC  35.7 (04/29/2010)   RDW  12.4 (04/29/2010)   PMN:  51.4 (04/29/2010)   Lymphs:  34.6 (04/29/2010)   Monos:  10.7 (04/29/2010)   Eosinophils:  2.5 (04/29/2010)   Basophil:  0.8 (04/29/2010)  Complete Metabolic Panel   Glucose:  95 (04/29/2010)   Sodium:  141 (04/29/2010)   Potassium:  4.2 (04/29/2010)   Chloride:  107 (04/29/2010)   CO2:  27 (04/29/2010)   BUN:  13 (04/29/2010)   Creatinine:  0.8 (04/29/2010)   Albumin:  4.3 (04/29/2010)   Total Protein:  6.7 (04/29/2010)   Calcium:  9.7 (04/29/2010)   Total Bili:  0.9  (04/29/2010)   Alk Phos:  65 (04/29/2010)   SGPT (ALT):  28 (04/29/2010)   SGOT (AST):  24 (04/29/2010)   Current Medications (verified): 1)  Levothyroxine Sodium 125 Mcg Tabs (Levothyroxine Sodium) .... Take 1 By Mouth Qd 2)  Aspirin 81 Mg Tabs (Aspirin) .... Take 1 By Mouth Qd 3)  Vitamin D3 1000 Unit Tabs (Cholecalciferol) .... Take 1 By Mouth Qd 4)  Fish Oil 1200 Mg Caps (Omega-3 Fatty Acids) .Marland Kitchen.. 1 By Mouth Once Daily 5)  Multivitamins  Tabs (Multiple Vitamin) .... Take 1 Po Qd 6)  Meloxicam 15 Mg Tabs (Meloxicam) .Marland Kitchen.. 1 By Mouth Once Daily As Needed For Pain 7)  Prilosec Otc 20 Mg Tbec (Omeprazole Magnesium) .Marland Kitchen.. 1 By Mouth Once Daily X 2 Weeks, Then As Needed  Allergies (verified): No Known Drug Allergies  Past History:  Past medical, surgical, family and social histories (including risk factors) reviewed, and no changes noted (except as noted below).  Past Medical History: Colonic polyps, hx of - (2007 - rec repeat colo 2012 by [facility]) Hypertension hypothyroid dyslipidemia arthritis, knees  MD roster: ortho Berton Lan  Past Surgical History: Reviewed history from 04/29/2010 and  no changes required. Tonsillectomy (2004) laminectomy (1991) Arthroscopic knee surgery (R -2003, L - 2010)  Family History: Reviewed history from 09/04/2009 and no changes required. Family History of Arthritis (grandparent & parents) Family History Diabetes 1st degree relative (parents) Family History Hypertension (parents) Heart disease(parent & grandparent) ALS (m grandfather) mother living, age 65, with advanced dementia for age Dad living in his 65s, status post CABG in his 62s  Social History: Reviewed history from 04/29/2010 and no changes required. Never Smoked retired Art therapist of [employer]- Lives with wife and currently looking for part-time job to stay active. Rare, social alcohol use    Review of Systems       see HPI above. I have reviewed all other systems  and they were negative.   Physical Exam  General:  overweight-appearing.  alert, well-developed, well-nourished, and cooperative to examination.  wife at side  Lungs:  normal respiratory effort, no intercostal retractions or use of accessory muscles; normal breath sounds bilaterally - no crackles and no wheezes.    Heart:  normal rate, regular rhythm, no murmur, and no rub. BLE without edema. normal DP pulses and normal cap refill in all 4 extremities    Psych:  Oriented X3, memory intact for recent and remote, normally interactive, good eye contact, not anxious appearing, not depressed appearing, and not agitated.      Impression & Recommendations:  Problem # 1:  PREOPERATIVE EXAMINATION (ICD-V72.84) This patient has been evaluated and it is felt that the surgical risk is low and outweighed by the potential benefit of the surgery. Therefore, medically clear to proceed when scheduling allows.   Problem # 2:  ARTHRITIS (ICD-716.90) planning THA right hip as above - per ortho  Problem # 3:  HYPOTHYROIDISM (ICD-244.9)  His updated medication list for this problem includes:    Levothyroxine Sodium 125 Mcg Tabs (Levothyroxine sodium) .Marland Kitchen... Take 1 by mouth once daily  Labs Reviewed: TSH: 3.82 (04/29/2010)    Chol: 210 (04/29/2010)   HDL: 42.50 (04/29/2010)   TG: 67.0 (04/29/2010)  Problem # 4:  DYSLIPIDEMIA (ICD-272.4)  takes omega-3 supplements, but never on prescription meds mild, will cont to monitor annually  Problem # 5:  HYPERTENSION (ICD-401.9) situational - never on meds for same - cont to monitor - no med tx at this time Orders: EKG w/ Interpretation (93000)  BP today: 124/78 Prior BP: 126/84 (04/29/2010)  Labs Reviewed: K+: 4.2 (04/29/2010) Creat: : 0.8 (04/29/2010)   Chol: 210 (04/29/2010)   HDL: 42.50 (04/29/2010)   TG: 67.0 (04/29/2010)  Complete Medication List: 1)  Levothyroxine Sodium 125 Mcg Tabs (Levothyroxine sodium) .... Take 1 by mouth once daily 2)   Aspirin 81 Mg Tabs (Aspirin) .... Take 1 by mouth once daily 3)  Vitamin D3 1000 Unit Tabs (Cholecalciferol) .... Take 1 by mouth once daily 4)  Fish Oil 1200 Mg Caps (Omega-3 fatty acids) .Marland Kitchen.. 1 by mouth once daily 5)  Multivitamins Tabs (Multiple vitamin) .... Take 1 po qd  Patient Instructions: 1)  it was good to see you today. 2)  You have been evaluated and it is felt that your surgical risk is low and outweighed by the potential benefit of the surgery. Therefore, you are medically clear to proceed when scheduling allows.  3)  Please schedule a follow-up appointment in 6 months or due for annual physical and labs, call sooner if problems.    Orders Added: 1)  EKG w/ Interpretation [93000] 2)  Consultation Level III [04540]

## 2011-01-31 LAB — CBC
HCT: 38.3 % — ABNORMAL LOW (ref 39.0–52.0)
Hemoglobin: 13.3 g/dL (ref 13.0–17.0)
MCH: 31.1 pg (ref 26.0–34.0)
MCHC: 34.7 g/dL (ref 30.0–36.0)
MCV: 89.5 fL (ref 78.0–100.0)
Platelets: 154 10*3/uL (ref 150–400)
RBC: 4.28 MIL/uL (ref 4.22–5.81)
RDW: 12.4 % (ref 11.5–15.5)
WBC: 10.4 10*3/uL (ref 4.0–10.5)

## 2011-01-31 LAB — BASIC METABOLIC PANEL
BUN: 8 mg/dL (ref 6–23)
CO2: 28 mEq/L (ref 19–32)
Calcium: 9 mg/dL (ref 8.4–10.5)
Chloride: 103 mEq/L (ref 96–112)
Creatinine, Ser: 0.86 mg/dL (ref 0.4–1.5)
GFR calc Af Amer: >60 mL/min (ref >60)
GFR calc non Af Amer: >60 mL/min (ref >60)
Glucose, Bld: 155 mg/dL — ABNORMAL HIGH (ref 70–99)
Potassium: 4 mEq/L (ref 3.5–5.1)
Sodium: 138 mEq/L (ref 135–145)

## 2011-02-01 LAB — CBC
HCT: 36.6 % — ABNORMAL LOW (ref 39.0–52.0)
Hemoglobin: 12.6 g/dL — ABNORMAL LOW (ref 13.0–17.0)
MCH: 30.8 pg (ref 26.0–34.0)
MCHC: 34.4 g/dL (ref 30.0–36.0)
MCV: 89.5 fL (ref 78.0–100.0)
Platelets: 162 10*3/uL (ref 150–400)
RBC: 4.09 MIL/uL — ABNORMAL LOW (ref 4.22–5.81)
RDW: 12.3 % (ref 11.5–15.5)
WBC: 10.8 10*3/uL — ABNORMAL HIGH (ref 4.0–10.5)

## 2011-02-12 NOTE — Op Note (Signed)
[name], [name]                ACCOUNT NO.:  [id number]  MEDICAL RECORD NO.:  [id number]           PATIENT TYPE:  I  LOCATION:  1608                         FACILITY:  [facility]  PHYSICIAN:  Ollen Gross, M.D.    DATE OF BIRTH:  May 01, 1951  DATE OF PROCEDURE:  01/29/2011 DATE OF DISCHARGE:                              OPERATIVE REPORT   PREOPERATIVE DIAGNOSIS:  Osteoarthritis of right hip.  POSTOPERATIVE DIAGNOSIS:  Osteoarthritis of right hip.  PROCEDURE:  Right total hip arthroplasty.  SURGEON:  Ollen Gross, M.D.  ASSISTANT:  [name], PA-C  ANESTHESIA:  General.  ESTIMATED BLOOD LOSS:  500.  DRAINS:  Hemovac x1.  COMPLICATIONS:  None.  CONDITION:  Stable to recovery.  CLINICAL NOTE:  [name] is a 60 year old male with end-stage arthritis of right hip with progressively worsening pain and dysfunction.  He has failed nonoperative management and presents for right total hip arthroplasty.  PROCEDURE IN DETAIL:  After successful administration of general anesthetic, the patient was placed in the left lateral decubitus position with the right side up and held with the hip positioner.  The right lower extremity was isolated from his perineum with plastic drapes and prepped and draped in the usual sterile fashion.  Short posterolateral incision was made with a 10 blade through the subcutaneous tissue to the level of fascia lata which was incised in line with the skin incision.  The sciatic nerve was palpated and protected and short rotators and capsule isolated off the femur.  The hip was dislocated and the center of femoral head was marked.  A trial prosthesis was placed such that the center with the trial head corresponds to center of his native femoral head.  Osteotomy line was marked on the femoral neck and osteotomy made with an oscillating saw. The femoral head was removed and retractors were placed around the proximal femur to gain access to the  canal.  The canal finder was used to gain access through the femoral canal and then was thoroughly irrigated to remove the fatty contents.  Axial reaming was performed to 11.5 mm.  He had an extraordinarily tight femoral canal.  The proximal reaming was performed to a 16 F and a sleeve machined to a small.  A 16 F small trial sleeve was placed.  The femur was retracted anteriorly to gain acetabular exposure. Acetabular retractors were placed and labrum and osteophytes removed. Acetabular reaming was performed with a 55 mm and placing a 56 mm pinnacle acetabular shell.  We had outstanding purchase and did not place any dome screws.  Apex hole eliminator was placed and a 40-mm neutral Ultamet metal liner was placed for metal-on-metal hip replacement.  The trial femur was placed which was a 16 x 11 with a 36 plus six neck with 10 degrees beyond native anteversion.  The 40+ 0 trial head was placed, reduced this easily to 40 +3 head which had more appropriate soft-tissue tension.  There was outstanding stability with full extension, full external rotation, 70 degrees flexion, 40 degrees adduction, 90 degrees internal rotation, 90 degrees flexion, 70 degrees internal rotation.  I  placed the right leg on top of the left and felt as though lengths were equal.  The hips dislocating trials removed.  The permanent 16 F small sleeve was placed with a 16 x 11 stem and 36 +6 neck, about 10 degrees beyond native anteversion.  A 40 +3 three head was placed and the hips reduced with the same stability parameters.  The wound was copiously irrigated with saline solution and then the short rotators and capsule reattached to the femur through drill holes with Ethibond suture.  Fascia lata was closed over Hemovac drain with interrupted #1 Vicryl, subcu closed with #1 #2-0 Vicryl and subcuticular running 4-0 Monocryl.  The catheter for Marcaine pain pump was placed and pumps initiated.  Incisions cleaned and  dried and Steri-Strips and a bulky sterile dressing were applied.  He was then placed into a knee immobilizer, awakened and transported to recovery in stable condition.     Ollen Gross, M.D.     FA/MEDQ  D:  01/29/2011  T:  01/29/2011  Job:  [id number]  Electronically Signed by Ollen Gross M.D. on 02/12/2011 02:53:51 PM

## 2011-02-12 NOTE — H&P (Signed)
[name], [name]                ACCOUNT NO.:  [id number]  MEDICAL RECORD NO.:  [id number]           PATIENT TYPE:  I  LOCATION:  1608                         FACILITY:  [facility]  PHYSICIAN:  Ollen Gross, M.D.    DATE OF BIRTH:  [birth year]  DATE OF ADMISSION:  01/29/2011 DATE OF DISCHARGE:  02/01/2011                             HISTORY & PHYSICAL   DATE OF OFFICE VISIT/HISTORY AND PHYSICAL:  January 09, 2011.  DATE OF ADMISSION:  January 29, 2011  CHIEF COMPLAINT:  Right hip pain.  HISTORY OF PRESENT ILLNESS:  The patient is a 60 year old male who has been seen by Dr. Lequita Halt for ongoing right hip pain.  He has had a previous intra-articular hip injection that made a big difference in the beginning.  He has known end-stage arthritis which has been progressive in nature.  It is felt he would benefit from undergoing surgical intervention.  Risks and benefits have been discussed.  He would like to proceed with surgery.  ALLERGIES:  No known drug allergies.  CURRENT MEDICATIONS:  Synthroid, Mobic, vitamin C, vitamin D, multivitamin, baby aspirin, omega 3 fish oil.  PAST MEDICAL HISTORY:  Hypercholesterolemia, umbilical hernia, hypothyroidism, degenerative disk disease, arthritis.  Childhood illnesses of measles and mumps.  PAST SURGICAL HISTORY:  Laminectomy in 1990.  He has undergone 2 knee arthroscopies, one in 2008 and one in 2011.  Also tonsils and adenoids in 2009.  FAMILY HISTORY:  Father with history of open heart surgery, history of aneurysm, skin cancer, and diabetes.  Mother with history of hypertension and hypercholesterolemia and a questionable history of ALS. Sister with breast cancer.  Two daughters with congenital epidermolysis bullosa.  SOCIAL HISTORY:  Married, semi-retired, nonsmoker.  Social intake of alcohol.  Wife will be assisting with care after surgery.  Has 3 steps entering home.  REVIEW OF SYSTEMS:  GENERAL:  No fevers, chills or night sweats.   NEURO: No seizure, syncope, or paralysis.  RESPIRATORY: No shortness of breath, productive cough or hemoptysis.  CARDIOVASCULAR:  No chest pain, no orthopnea.  GI:  No nausea, vomiting, diarrhea, constipation.  GU: Little bit of nocturia, frequency.  No dysuria or hematuria. MUSCULOSKELETAL: Hip pain.  PHYSICAL EXAMINATION:  VITAL SIGNS:  Pulse 64, respirations 12, blood pressure 145/80. GENERAL:  A 60 year old male, well nourished, well developed, overweight, no acute distress.  He is alert, oriented, cooperative, good historian, accompanied by his wife [name]. HEENT: Normocephalic, atraumatic.  Pupils round and reactive.  EOMs intact.  Notable contacts. NECK:  Supple.  No carotid bruits. CHEST:  Clear. HEART:  Regular rate and rhythm without murmur.  S1, S2 noted. ABDOMEN:  Soft, round, protuberant abdomen.  Bowel sounds present. RECTAL:  Not done, not pertinent to present illness. BREASTS:  Not done, not pertinent to present illness. GENITALIA:  Not done, not pertinent to present illness. EXTREMITIES:  Right hip flexion 100, internal rotation 10, external rotation 20, abduction 20.  IMPRESSION:  Osteoarthritis, right hip.  PLAN:  The patient will be admitted to [facility], undergo a right total hip replacement arthroplasty.  Surgery will be performed by Dr.  Homero Fellers Aluisio.     Alexzandrew L. Julien Girt, P.A.C.   ______________________________ Ollen Gross, M.D.    ALP/MEDQ  D:  02/10/2011  T:  02/10/2011  Job:  [id number]  cc:   Vikki Ports A. Felicity Coyer, MD [address] [place], [place] [address]  Electronically Signed by [name] P.A.C. on 02/12/2011 11:22:15 AM Electronically Signed by Ollen Gross M.D. on 02/12/2011 02:53:56 PM

## 2011-03-19 NOTE — Discharge Summary (Signed)
[name], [name]                ACCOUNT NO.:  [id number]  MEDICAL RECORD NO.:  [id number]           PATIENT TYPE:  I  LOCATION:  1608                         FACILITY:  [facility]  PHYSICIAN:  Ollen Gross, M.D.    DATE OF BIRTH:  Feb 11, 1951  DATE OF ADMISSION:  01/29/2011 DATE OF DISCHARGE:  02/01/2011                              DISCHARGE SUMMARY   ADMITTING DIAGNOSES: 1. Osteoarthritis, right hip. 2. Hypercholesterolemia. 3. Umbilical hernia. 4. Hypothyroidism. 5. Degenerative disk disease. 6. Arthritis. 7. Childhood illnesses of measles and mumps.  DISCHARGE DIAGNOSES: 1. Osteoarthritis, right hip, status post right total hip replacement     arthroplasty. 2. Hypercholesterolemia. 3. Umbilical hernia. 4. Hypothyroidism. 5. Degenerative disk disease. 6. Arthritis. 7. Childhood illnesses of measles and mumps.  PROCEDURE:  January 29, 2011, right total hip.  SURGEON:  Ollen Gross, M.D.  ASSISTANT:  Alexzandrew L. Perkins, P.A.C.  ANESTHESIA:  General.  CONSULTATIONS:  None.  BRIEF HISTORY:  The patient is a 60 year old male with end-stage arthritis, right hip, progressive worsening pain and dysfunction, failed nonoperative management, now presents for total hip arthroplasty.  LABORATORY DATA:  Preop CBC showed a hemoglobin of 15.1, hematocrit of 42.2, white cell count 5.6, platelets 166.  Postop hemoglobin 13.9 and 13.3.  Last noted hemoglobin 12.6 and hematocrit 36.6.  PT/INR 14.3/1.09 with a PTT of 39. Chem panel on admission all within normal limits. Serial BMETs were followed for 48 hours.  Electrolytes remained within normal limits.  Glucose did go up from 88 to 167, back down to 155. Preop UA negative.  Blood group type O positive.  Nasal swabs were negative.  Staph aureus negative for MRSA.  X-RAYS:  Right hip film, preop, on January 22, 2011, osteoarthritis, degenerate spondylosis.  Postop hip films on January 29, 2010.  Postop hip and pelvis film  expected appearance of right total hip prosthesis.  HOSPITAL COURSE:  The patient admitted to [facility], taken to OR, underwent the above-stated procedure without complication.  The patient tolerated the procedure well, later transferred to recovery room and then to orthopedic floor, started on p.o. and IV analgesics for pain control following surgery, doing fairly well on day #1, except with some problems with Foley, so we discontinued that.  He had excellent urinary output.  Labs looked good.  By day #2, he was doing well with his procedure, progressing with his therapy.  Dressing change incision looked good.  Hemoglobin was stable.  He continued to progress well with Physical Therapy.  By day #3, he was meeting his goals and discharged home.  DISCHARGE PLAN: 1. The patient was discharged home on February 01, 2011. 2. Discharge diagnoses, please see above. 3. Discharge medications, Robaxin, Percocet, Xarelto, baby aspirin,     and levothyroxine.  DIET:  Heart-healthy diet.  ACTIVITY:  He is partial weightbearing 25% to 50%, right lower extremity, hip precautions, total hip protocol.  FOLLOWUP:  In 2 weeks.  DISPOSITION:  Home.  CONDITION ON DISCHARGE:  Improved.     Alexzandrew L. Julien Girt, P.A.C.   ______________________________ Ollen Gross, M.D.    ALP/MEDQ  D:  03/06/2011  T:  03/07/2011  Job:  [id number]  Electronically Signed by [name] P.A.C. on 03/07/2011 10:07:49 AM Electronically Signed by Ollen Gross M.D. on 03/19/2011 08:16:25 AM

## 2011-04-02 LAB — POCT HEMOGLOBIN-HEMACUE: Hemoglobin: 16.1 g/dL (ref 13.0–17.0)

## 2011-04-08 ENCOUNTER — Other Ambulatory Visit: Payer: Self-pay | Admitting: *Deleted

## 2011-04-08 MED ORDER — LEVOTHYROXINE SODIUM 125 MCG PO TABS: 125.0000 ug | ORAL_TABLET | Freq: Every day | ORAL | Status: DC

## 2011-06-04 ENCOUNTER — Other Ambulatory Visit: Payer: Self-pay | Admitting: Internal Medicine

## 2011-06-09 ENCOUNTER — Other Ambulatory Visit: Payer: Self-pay | Admitting: Internal Medicine

## 2011-06-09 ENCOUNTER — Other Ambulatory Visit (INDEPENDENT_AMBULATORY_CARE_PROVIDER_SITE_OTHER): Payer: 59

## 2011-06-09 DIAGNOSIS — Z0000 Encounter for general adult medical examination without abnormal findings: Secondary | ICD-10-CM

## 2011-06-09 DIAGNOSIS — Z0389 Encounter for observation for other suspected diseases and conditions ruled out: Secondary | ICD-10-CM

## 2011-06-09 LAB — BASIC METABOLIC PANEL
BUN: 15 mg/dL (ref 6–23)
CO2: 29 mEq/L (ref 19–32)
Calcium: 9.4 mg/dL (ref 8.4–10.5)
Chloride: 105 mEq/L (ref 96–112)
Creatinine, Ser: 0.9 mg/dL (ref 0.4–1.5)
GFR: 95.81 mL/min (ref >60.00)
Glucose, Bld: 96 mg/dL (ref 70–99)
Potassium: 4.4 mEq/L (ref 3.5–5.1)
Sodium: 140 mEq/L (ref 135–145)

## 2011-06-09 LAB — LIPID PANEL
Cholesterol: 222 mg/dL — ABNORMAL HIGH (ref 0–200)
HDL: 48.9 mg/dL (ref >39.00)
Total CHOL/HDL Ratio: 5
Triglycerides: 122 mg/dL (ref 0.0–149.0)
VLDL: 24.4 mg/dL (ref 0.0–40.0)

## 2011-06-09 LAB — CBC WITH DIFFERENTIAL/PLATELET
Basophils Absolute: 0 10*3/uL (ref 0.0–0.1)
Basophils Relative: 0.7 % (ref 0.0–3.0)
Eosinophils Absolute: 0.1 10*3/uL (ref 0.0–0.7)
Eosinophils Relative: 2 % (ref 0.0–5.0)
HCT: 45.4 % (ref 39.0–52.0)
Hemoglobin: 15.8 g/dL (ref 13.0–17.0)
Lymphocytes Relative: 30.6 % (ref 12.0–46.0)
Lymphs Abs: 1.7 10*3/uL (ref 0.7–4.0)
MCHC: 34.7 g/dL (ref 30.0–36.0)
MCV: 88.1 fl (ref 78.0–100.0)
Monocytes Absolute: 0.7 10*3/uL (ref 0.1–1.0)
Monocytes Relative: 12.4 % — ABNORMAL HIGH (ref 3.0–12.0)
Neutro Abs: 3.1 10*3/uL (ref 1.4–7.7)
Neutrophils Relative %: 54.3 % (ref 43.0–77.0)
Platelets: 188 10*3/uL (ref 150.0–400.0)
RBC: 5.16 Mil/uL (ref 4.22–5.81)
RDW: 14.6 % (ref 11.5–14.6)
WBC: 5.7 10*3/uL (ref 4.5–10.5)

## 2011-06-09 LAB — LDL CHOLESTEROL, DIRECT: Direct LDL: 153.1 mg/dL

## 2011-06-09 LAB — URINALYSIS
Bilirubin Urine: NEGATIVE
Hgb urine dipstick: NEGATIVE
Ketones, ur: NEGATIVE
Leukocytes, UA: NEGATIVE
Nitrite: NEGATIVE
Specific Gravity, Urine: 1.025 (ref 1.000–1.030)
Total Protein, Urine: NEGATIVE
Urine Glucose: NEGATIVE
Urobilinogen, UA: 0.2 (ref 0.0–1.0)
pH: 6 (ref 5.0–8.0)

## 2011-06-09 LAB — HEPATIC FUNCTION PANEL
ALT: 26 U/L (ref 0–53)
AST: 24 U/L (ref 0–37)
Albumin: 4.7 g/dL (ref 3.5–5.2)
Alkaline Phosphatase: 84 U/L (ref 39–117)
Bilirubin, Direct: 0.2 mg/dL (ref 0.0–0.3)
Total Bilirubin: 1.2 mg/dL (ref 0.3–1.2)
Total Protein: 7.4 g/dL (ref 6.0–8.3)

## 2011-06-09 LAB — PSA: PSA: 5.05 ng/mL — ABNORMAL HIGH (ref 0.10–4.00)

## 2011-06-09 LAB — TSH: TSH: 1.92 u[IU]/mL (ref 0.35–5.50)

## 2011-06-16 ENCOUNTER — Encounter: Payer: Self-pay | Admitting: Internal Medicine

## 2011-06-24 ENCOUNTER — Encounter: Payer: Self-pay | Admitting: Internal Medicine

## 2011-06-25 ENCOUNTER — Ambulatory Visit (INDEPENDENT_AMBULATORY_CARE_PROVIDER_SITE_OTHER): Payer: 59 | Admitting: Internal Medicine

## 2011-06-25 ENCOUNTER — Encounter: Payer: Self-pay | Admitting: Internal Medicine

## 2011-06-25 VITALS — BP 142/90 | HR 69 | Temp 98.8°F | Ht 72.0 in | Wt 252.0 lb

## 2011-06-25 DIAGNOSIS — I10 Essential (primary) hypertension: Secondary | ICD-10-CM

## 2011-06-25 DIAGNOSIS — R972 Elevated prostate specific antigen [PSA]: Secondary | ICD-10-CM

## 2011-06-25 DIAGNOSIS — E785 Hyperlipidemia, unspecified: Secondary | ICD-10-CM

## 2011-06-25 DIAGNOSIS — E039 Hypothyroidism, unspecified: Secondary | ICD-10-CM

## 2011-06-25 DIAGNOSIS — Z0000 Encounter for general adult medical examination without abnormal findings: Secondary | ICD-10-CM

## 2011-06-25 DIAGNOSIS — M5430 Sciatica, unspecified side: Secondary | ICD-10-CM

## 2011-06-25 DIAGNOSIS — G5700 Lesion of sciatic nerve, unspecified lower limb: Secondary | ICD-10-CM

## 2011-06-25 MED ORDER — PREDNISONE (PAK) 10 MG PO TABS: 10.0000 mg | ORAL_TABLET | ORAL | Status: AC

## 2011-06-25 NOTE — Progress Notes (Signed)
Subjective:    Patient ID: [name], male    DOB: Feb 19, 1951, 60 y.o.   MRN: [id number]  HPI patient is here today for annual physical. Patient feels well and has no complaints.  Also reviewed chronic medical issues: Hypothyroid - the patient reports compliance with medication(s) as prescribed. Denies adverse side effects. No skin or bowel changes.  HTN - borderline, never on meds for same - admits to diet indiscretion last few weeks on vacation and weight gain  Dyslipidemia - takes omega 3 fish oil, never on statin rx - had lost weight following THR 01/2011 but now inc trend weight  complains of LLE pain -  Numbness and ache running from low left back down buttock, posterior thigh to above ankle No weakness, no  Fall/injury, no swelling, no fever denies hx same No precipitating injury or overexertion Worst with prolonged car trip or lying in bed at night  Past Medical History  Diagnosis Date  . COLONIC POLYPS, HX OF 2007  . Arthritis     Knees  . Incomplete bladder emptying   . OBESITY   . HYPOTHYROIDISM   . HYPERTENSION   . DYSLIPIDEMIA    Family History  Problem Relation Age of Onset  . Heart disease Father   . Arthritis Other     Parent & grandparents  . Diabetes Other     parents  . Hypertension Other     parents   History  Substance Use Topics  . Smoking status: Never Smoker   . Smokeless tobacco: Not on file   Comment: Lives with wife and currently looking for part-time job to stay active.   . Alcohol Use: Yes     rarely, social    Review of Systems  Constitutional: Negative for fever.  Respiratory: Negative for cough and shortness of breath.   Cardiovascular: Negative for chest pain.  Gastrointestinal: Negative for abdominal pain.  Musculoskeletal: Negative for gait problem.  Skin: Negative for rash.  Neurological: Negative for dizziness.  No other specific complaints in a complete review of systems (except as listed in HPI above).      Objective:   Physical Exam BP 142/90  Pulse 69  Temp(Src) 98.8 F (37.1 C) (Oral)  Ht 6' (1.829 m)  Wt 252 lb (114.306 kg)  BMI 34.18 kg/m2  SpO2 96% Wt Readings from Last 3 Encounters:  06/25/11 252 lb (114.306 kg)  12/09/10 251 lb (113.853 kg)  04/29/10 259 lb 12.8 oz (117.845 kg)    Physical Exam  Constitutional:  oriented to person, place, and time. appears well-developed and well-nourished. No distress.  Neck: Normal range of motion. Neck supple. No JVD present. No thyromegaly present.  Cardiovascular: Normal rate, regular rhythm and normal heart sounds.  No murmur heard. Pulmonary/Chest: Effort normal and breath sounds normal. No respiratory distress. no wheezes.  Abdominal: Soft. Bowel sounds are normal. Patient exhibits no distension. There is no tenderness.  GU: prostate firm, smooth, non tender and not enlarged, no masses Musculoskeletal: Back: full range of motion of thoracic and lumbar spine. Non tender to palpation. Negative straight leg raise. DTR's are symmetrically intact. Sensation intact in all dermatomes of the lower extremities. Full strength to manual muscle testing and able to heel toe walk without difficulty and ambulates with a normal gait. Neurological: he is alert and oriented to person, place, and time. No cranial nerve deficit. Coordination normal.  Skin: Skin is warm and dry.  No erythema or ulceration.  Psychiatric: he has a normal  mood and affect. behavior is normal. Judgment and thought content normal.   Lab Results  Component Value Date   WBC 5.7 06/09/2011   HGB 15.8 06/09/2011   HCT 45.4 06/09/2011   PLT 188.0 06/09/2011   CHOL 222* 06/09/2011   TRIG 122.0 06/09/2011   HDL 48.90 06/09/2011   LDLDIRECT 153.1 06/09/2011   ALT 26 06/09/2011   AST 24 06/09/2011   NA 140 06/09/2011   K 4.4 06/09/2011   CL 105 06/09/2011   CREATININE 0.9 06/09/2011   BUN 15 06/09/2011   CO2 29 06/09/2011   TSH 1.92 06/09/2011   PSA 5.05* 06/09/2011   INR 1.09 01/22/2011    ECG - NSR at 68bpm - no ischemic change or arrythmia     Assessment & Plan:  CPX - v70.0 - Patient has been counseled on age-appropriate routine health concerns for screening and prevention. These are reviewed and up-to-date. Immunizations are up-to-date or declined. Labs and ECG reviewed.  Mild increase PSA - no urinary symptoms or problems; no hx same - exam benign - will recheck in 6 mo, pt to call sooner if problems  LLE sciatica - pred pak and back PT (declines formal PT refer at this time) - will call if symptoms unimproved, sooner if worse - erx done  Also see problem list. Medications and labs reviewed today.

## 2011-06-25 NOTE — Assessment & Plan Note (Signed)
The current medical regimen is effective;  continue present plan and medications. Lab Results  Component Value Date   TSH 1.92 06/09/2011

## 2011-06-25 NOTE — Assessment & Plan Note (Signed)
BP Readings from Last 3 Encounters:  06/25/11 142/90  12/09/10 124/78  04/29/10 126/84   Increase today - recheck in 6 weeks with nurse visit - tx if >140/90 Educated on weight, diet and exercise to control same

## 2011-06-25 NOTE — Patient Instructions (Signed)
It was good to see you today. We have reviewed your prior records including labs and tests today Will recheck PSA in 6 months (lab visit only) - exam today seems good - call sooner if problems Return in 6 weeks for nurse visit to recheck blood pressure - if still >140/90, will need to start medications for high blood pressure Watch salt (sodium) intake and keep active, work on weight reduction with diet/exercise as discussed Steroid dose pack for back/left leg pain symptoms - Your prescription(s) have been submitted to your pharmacy. Please take as directed and contact our office if you believe you are having problem(s) with the medication(s). Please schedule followup in 6-12 months, call sooner if problems.

## 2011-06-25 NOTE — Assessment & Plan Note (Signed)
Increase trend in LDL and total - compliance with omega3 without change Advised attention to weight with diet/exercise

## 2011-08-04 ENCOUNTER — Encounter: Payer: Self-pay | Admitting: Internal Medicine

## 2011-08-08 ENCOUNTER — Ambulatory Visit (INDEPENDENT_AMBULATORY_CARE_PROVIDER_SITE_OTHER): Payer: 59

## 2011-08-08 VITALS — BP 132/88

## 2011-08-08 DIAGNOSIS — I10 Essential (primary) hypertension: Secondary | ICD-10-CM

## 2011-08-08 NOTE — Progress Notes (Signed)
Informed Dr. Felicity Coyer of patients BP today 08/08/2011. She informed ok to have patient schedule OV in 6 months from previous. Also informed patient per MD's instructions continue with diet, exercise and check BP routinely and to call the office if does have high BP readings.

## 2011-08-08 NOTE — Progress Notes (Signed)
  Subjective:    Patient ID: [name], male    DOB: 05/29/1951, 60 y.o.   MRN: [id number]  HPI    Review of Systems     Objective:   Physical Exam        Assessment & Plan:

## 2011-10-17 ENCOUNTER — Other Ambulatory Visit: Payer: Self-pay | Admitting: Internal Medicine

## 2011-12-01 ENCOUNTER — Ambulatory Visit: Payer: 59 | Admitting: Internal Medicine

## 2011-12-03 ENCOUNTER — Other Ambulatory Visit: Payer: Self-pay | Admitting: *Deleted

## 2011-12-03 NOTE — Telephone Encounter (Signed)
Pt's spouse is calling requesting a Z-pak for husband; pt has productive cough and congestion in chest, spouse states pt has upcoming appt in December.

## 2011-12-03 NOTE — Telephone Encounter (Signed)
Done as requested, patient to make visits if symptoms unimproved or worse. Thanks

## 2011-12-04 MED ORDER — AZITHROMYCIN 250 MG PO TABS: ORAL_TABLET | ORAL | Status: AC

## 2011-12-04 NOTE — Telephone Encounter (Signed)
Rx sent to [facility], pt informed of MD's advisement.

## 2011-12-15 ENCOUNTER — Other Ambulatory Visit (INDEPENDENT_AMBULATORY_CARE_PROVIDER_SITE_OTHER): Payer: 59

## 2011-12-15 ENCOUNTER — Encounter: Payer: Self-pay | Admitting: Internal Medicine

## 2011-12-15 ENCOUNTER — Ambulatory Visit (INDEPENDENT_AMBULATORY_CARE_PROVIDER_SITE_OTHER): Payer: 59 | Admitting: Internal Medicine

## 2011-12-15 DIAGNOSIS — E039 Hypothyroidism, unspecified: Secondary | ICD-10-CM

## 2011-12-15 DIAGNOSIS — R972 Elevated prostate specific antigen [PSA]: Secondary | ICD-10-CM

## 2011-12-15 DIAGNOSIS — E785 Hyperlipidemia, unspecified: Secondary | ICD-10-CM

## 2011-12-15 DIAGNOSIS — I10 Essential (primary) hypertension: Secondary | ICD-10-CM

## 2011-12-15 LAB — TSH: TSH: 1.42 u[IU]/mL (ref 0.35–5.50)

## 2011-12-15 NOTE — Patient Instructions (Addendum)
It was good to see you today. We have reviewed your prior records including labs and tests today Test(s) ordered today. Your results will be called to you after review (48-72hours after test completion). If any changes need to be made, you will be notified at that time. Watch salt (sodium) intake and keep active, work on weight reduction with diet/exercise as discussed Talk with Dr. Berton Lan about your knee as discussed Please schedule followup in 6  Months for physical and labs, call sooner if problems.

## 2011-12-15 NOTE — Progress Notes (Signed)
  Subjective:    Patient ID: [name], male    DOB: 05-21-1951, 60 y.o.   MRN: [id number]  HPI  Here for follow up -reviewed chronic medical issues: Hypothyroid - the patient reports compliance with medication(s) as prescribed. Denies adverse side effects. No skin or bowel changes.  HTN - borderline, never on meds for same - admits to diet indiscretion last few weeks on vacation and weight gain  Dyslipidemia - takes omega 3 fish oil, never on statin rx - had lost weight following THR 01/2011 but now inc trend weight   Past Medical History  Diagnosis Date  . COLONIC POLYPS, HX OF 2007  . Arthritis     Knees  . OBESITY   . HYPOTHYROIDISM   . HYPERTENSION   . DYSLIPIDEMIA     Review of Systems Constitutional: Negative for fever.  Respiratory: Negative for cough and shortness of breath.   Cardiovascular: Negative for chest pain or palpitations.     Objective:   Physical Exam  BP 132/82  Pulse 68  Temp(Src) 98.3 F (36.8 C) (Oral)  Wt 270 lb 1.9 oz (122.526 kg)  SpO2 96% Wt Readings from Last 3 Encounters:  12/15/11 270 lb 1.9 oz (122.526 kg)  06/25/11 252 lb (114.306 kg)  12/09/10 251 lb (113.853 kg)   Constitutional: He appears well-developed and well-nourished. No distress.  Neck: Normal range of motion. Neck supple. No JVD present. No thyromegaly present.  Cardiovascular: Normal rate, regular rhythm and normal heart sounds.  No murmur heard. Pulmonary/Chest: Effort normal and breath sounds normal. No respiratory distress. no wheezes.  Psychiatric: he has a normal mood and affect. behavior is normal. Judgment and thought content normal.   Lab Results  Component Value Date   WBC 5.7 06/09/2011   HGB 15.8 06/09/2011   HCT 45.4 06/09/2011   PLT 188.0 06/09/2011   CHOL 222* 06/09/2011   TRIG 122.0 06/09/2011   HDL 48.90 06/09/2011   LDLDIRECT 153.1 06/09/2011   ALT 26 06/09/2011   AST 24 06/09/2011   NA 140 06/09/2011   K 4.4 06/09/2011   CL 105 06/09/2011   CREATININE  0.9 06/09/2011   BUN 15 06/09/2011   CO2 29 06/09/2011   TSH 1.92 06/09/2011   PSA 5.05* 06/09/2011   INR 1.09 01/22/2011        Assessment & Plan:  See problem list. Medications and labs reviewed today.   Abnormal PSA - mild BPF symptoms, has declined uro eval or meds - recheck now and reconsider uro eval if continued increase in PSA

## 2011-12-15 NOTE — Assessment & Plan Note (Signed)
Increase trend in LDL and total at last check - compliance with omega3 without change Advised attention to weight with diet/exercise

## 2011-12-15 NOTE — Assessment & Plan Note (Signed)
BP Readings from Last 3 Encounters:  12/15/11 132/82  08/08/11 132/88  06/25/11 142/90   Slightly up today -  Educated on weight, diet and exercise to control same

## 2011-12-15 NOTE — Assessment & Plan Note (Signed)
The current medical regimen is effective;  continue present plan and medications. Lab Results  Component Value Date   TSH 1.92 06/09/2011

## 2011-12-16 ENCOUNTER — Other Ambulatory Visit: Payer: Self-pay | Admitting: Internal Medicine

## 2011-12-16 DIAGNOSIS — R972 Elevated prostate specific antigen [PSA]: Secondary | ICD-10-CM

## 2011-12-16 LAB — PSA, TOTAL AND FREE
PSA, Free Pct: 22 % — ABNORMAL LOW (ref >25)
PSA, Free: 1.27 ng/mL
PSA: 5.72 ng/mL — ABNORMAL HIGH (ref <=4.00)

## 2012-04-19 ENCOUNTER — Other Ambulatory Visit: Payer: Self-pay | Admitting: Internal Medicine

## 2012-06-14 ENCOUNTER — Encounter: Payer: 59 | Admitting: Internal Medicine

## 2012-11-11 ENCOUNTER — Other Ambulatory Visit: Payer: Self-pay | Admitting: Orthopedic Surgery

## 2012-11-11 MED ORDER — DEXAMETHASONE SODIUM PHOSPHATE 10 MG/ML IJ SOLN: 10.0000 mg | Freq: Once | INTRAMUSCULAR | Status: DC

## 2012-11-11 NOTE — Progress Notes (Signed)
Preoperative surgical orders have been place into the [facility] hospital system for [name] on 11/11/2012, 11:02 AM  by [name] for surgery on 12/27/12.  Preop Bilateral Total Knee orders including Epidural per Anesthesia, IV Tylenol, and IV Decadron as long as there are no contraindications to the above medications. [name], PA-C

## 2012-11-24 ENCOUNTER — Ambulatory Visit: Payer: 59 | Admitting: Internal Medicine

## 2012-11-29 ENCOUNTER — Ambulatory Visit: Payer: 59 | Admitting: Internal Medicine

## 2012-12-02 ENCOUNTER — Other Ambulatory Visit (INDEPENDENT_AMBULATORY_CARE_PROVIDER_SITE_OTHER): Payer: 59

## 2012-12-02 ENCOUNTER — Ambulatory Visit (INDEPENDENT_AMBULATORY_CARE_PROVIDER_SITE_OTHER): Payer: 59 | Admitting: Internal Medicine

## 2012-12-02 ENCOUNTER — Encounter: Payer: Self-pay | Admitting: Internal Medicine

## 2012-12-02 VITALS — BP 142/88 | HR 63 | Temp 97.9°F | Ht 72.0 in | Wt 272.1 lb

## 2012-12-02 DIAGNOSIS — N400 Enlarged prostate without lower urinary tract symptoms: Secondary | ICD-10-CM | POA: Insufficient documentation

## 2012-12-02 DIAGNOSIS — E039 Hypothyroidism, unspecified: Secondary | ICD-10-CM

## 2012-12-02 DIAGNOSIS — I10 Essential (primary) hypertension: Secondary | ICD-10-CM

## 2012-12-02 DIAGNOSIS — E669 Obesity, unspecified: Secondary | ICD-10-CM

## 2012-12-02 DIAGNOSIS — M129 Arthropathy, unspecified: Secondary | ICD-10-CM

## 2012-12-02 LAB — TSH: TSH: 4.41 u[IU]/mL (ref 0.35–5.50)

## 2012-12-02 MED ORDER — AMLODIPINE BESYLATE 5 MG PO TABS: 5.0000 mg | ORAL_TABLET | Freq: Every day | ORAL | Status: DC

## 2012-12-02 NOTE — Assessment & Plan Note (Signed)
BP Readings from Last 3 Encounters:  12/02/12 142/88  12/15/11 132/82  08/08/11 132/88   Increased trend - start amlodipine 5mg  qd now Educated on weight, diet and exercise to control same

## 2012-12-02 NOTE — Progress Notes (Signed)
Subjective:    Patient ID: [name], male    DOB: November 25, 1951, 61 y.o.   MRN: [id number]  HPI  Here for preop clearance - requested by Alusio and planning B TKR 12/27/12 no chest pain, change in edema - no shortness of breath or dyspnea on exertion  no history of coronary artery disease or chronic kidney disease; no diabetes mellitus   pt able to walk 2 blocks without fatigue (but limited by pain in knees, especially if climbing stairs)   Also reviewed chronic medical issues:  Hypothyroid - the patient reports compliance with medication(s) as prescribed. Denies adverse side effects. No skin or bowel changes.  HTN - "borderline" - never on meds for same - worse with weight gain  Dyslipidemia - takes omega 3 fish oil, never on statin rx -  Past Medical History  Diagnosis Date  . COLONIC POLYPS, HX OF 2007  . Arthritis     Knees  . OBESITY   . HYPOTHYROIDISM   . HYPERTENSION   . DYSLIPIDEMIA   . BPH (benign prostatic hypertrophy)    Family History  Problem Relation Age of Onset  . Heart disease Father   . Arthritis Other     Parent & grandparents  . Diabetes Other     parents  . Hypertension Other     parents   History  Substance Use Topics  . Smoking status: Never Smoker   . Smokeless tobacco: Not on file     Comment: Lives with wife  . Alcohol Use: Yes     Comment: rarely, social    Review of Systems Constitutional: Negative for fever or weight change.  Respiratory: Negative for cough and shortness of breath.   Cardiovascular: Negative for chest pain or palpitations.  Gastrointestinal: Negative for abdominal pain, no bowel changes.  Musculoskeletal: Negative for gait problem or joint swelling.  Skin: Negative for rash.  Neurological: Negative for dizziness or headache.  No other specific complaints in a complete review of systems (except as listed in HPI above).      Objective:   Physical Exam  BP 142/88  Pulse 63  Temp 97.9 F (36.6 C) (Oral)  Ht  6' (1.829 m)  Wt 272 lb 1.9 oz (123.433 kg)  BMI 36.91 kg/m2  SpO2 95% Wt Readings from Last 3 Encounters:  12/02/12 272 lb 1.9 oz (123.433 kg)  12/15/11 270 lb 1.9 oz (122.526 kg)  06/25/11 252 lb (114.306 kg)   Constitutional:  He is overweight, but appears well-developed and well-nourished. No distress.  Neck: Thick. Normal range of motion. Neck supple. No JVD present. No thyromegaly present.  Cardiovascular: Normal rate, regular rhythm and normal heart sounds.  No murmur heard. no BLE edema Pulmonary/Chest: Effort normal and breath sounds normal. No respiratory distress. no wheezes.  Abdominal: Soft. Bowel sounds are normal. Patient exhibits no distension. There is no tenderness.  Musculoskeletal: B knees - boggy synovitis - tender to palpation over joint line; FROM and ligamentous function intact  Neurological: he is alert and oriented to person, place, and time. No cranial nerve deficit. Coordination normal.  Skin: Skin is warm and dry.  No erythema or ulceration.  Psychiatric: he has a normal mood and affect. behavior is normal. Judgment and thought content normal.   Lab Results  Component Value Date   WBC 5.7 06/09/2011   HGB 15.8 06/09/2011   HCT 45.4 06/09/2011   PLT 188.0 06/09/2011   CHOL 222* 06/09/2011   TRIG 122.0 06/09/2011  HDL 48.90 06/09/2011   LDLDIRECT 153.1 06/09/2011   ALT 26 06/09/2011   AST 24 06/09/2011   NA 140 06/09/2011   K 4.4 06/09/2011   CL 105 06/09/2011   CREATININE 0.9 06/09/2011   BUN 15 06/09/2011   CO2 29 06/09/2011   TSH 1.42 12/15/2011   PSA 5.72* 12/15/2011   INR 1.09 01/22/2011   JXB:JYNWG brady @ 58 bpm - no ischemic changes     Assessment & Plan:   Preop clearance - B TKR scheduled 12/27/12 - This patient has been evaluated and it is felt that the surgical risk is low and outweighed by the potential benefit of the surgery. Therefore, medically clear to proceed when scheduling allows.  Also See problem list. Medications and labs reviewed  today.

## 2012-12-02 NOTE — Assessment & Plan Note (Signed)
The current medical regimen is effective;  continue present plan and medications. Lab Results  Component Value Date   TSH 1.42 12/15/2011

## 2012-12-02 NOTE — Assessment & Plan Note (Signed)
B knees, planning b TKR 12/27/12 - see above On meloxicam - mgmt per surgery

## 2012-12-02 NOTE — Assessment & Plan Note (Signed)
Weight trend reviewed The patient is asked to make an attempt to improve diet and exercise patterns to aid in medical management of this problem. 

## 2012-12-02 NOTE — Patient Instructions (Signed)
It was good to see you today. We have reviewed your prior records including labs and tests today Test(s) ordered today. Your results will be released to MyChart (or called to you) after review, usually within 72hours after test completion. If any changes need to be made, you will be notified at that same time. Start amlodipine 5mg  once daily for blood pressure control - Your prescription(s) have been submitted to your pharmacy. Please take as directed and contact our office if you believe you are having problem(s) with the medication(s). You have been evaluated and it is felt that the surgical risk is low and outweighed by the potential benefit of the surgery. Therefore, medically clear to proceed when scheduling allows. Will let Dr Alusio's office know same Please schedule followup in 3-4 months for weight recheck and blood pressure recheck, call sooner if problems.   Hypertension As your heart beats, it forces blood through your arteries. This force is your blood pressure. If the pressure is too high, it is called hypertension (HTN) or high blood pressure. HTN is dangerous because you may have it and not know it. High blood pressure may mean that your heart has to work harder to pump blood. Your arteries may be narrow or stiff. The extra work puts you at risk for heart disease, stroke, and other problems.   Blood pressure consists of two numbers, a higher number over a lower, 110/72, for example. It is stated as "110 over 72." The ideal is below 120 for the top number (systolic) and under 80 for the bottom (diastolic). Write down your blood pressure today. You should pay close attention to your blood pressure if you have certain conditions such as:  Heart failure.   Prior heart attack.   Diabetes   Chronic kidney disease.   Prior stroke.   Multiple risk factors for heart disease.  To see if you have HTN, your blood pressure should be measured while you are seated with your arm held at the  level of the heart. It should be measured at least twice. A one-time elevated blood pressure reading (especially in the Emergency Department) does not mean that you need treatment. There may be conditions in which the blood pressure is different between your right and left arms. It is important to see your caregiver soon for a recheck. Most people have essential hypertension which means that there is not a specific cause. This type of high blood pressure may be lowered by changing lifestyle factors such as:  Stress.   Smoking.   Lack of exercise.   Excessive weight.   Drug/tobacco/alcohol use.   Eating less salt.  Most people do not have symptoms from high blood pressure until it has caused damage to the body. Effective treatment can often prevent, delay or reduce that damage. TREATMENT   When a cause has been identified, treatment for high blood pressure is directed at the cause. There are a large number of medications to treat HTN. These fall into several categories, and your caregiver will help you select the medicines that are best for you. Medications may have side effects. You should review side effects with your caregiver. If your blood pressure stays high after you have made lifestyle changes or started on medicines,    Your medication(s) may need to be changed.   Other problems may need to be addressed.   Be certain you understand your prescriptions, and know how and when to take your medicine.   Be sure to follow  up with your caregiver within the time frame advised (usually within two weeks) to have your blood pressure rechecked and to review your medications.   If you are taking more than one medicine to lower your blood pressure, make sure you know how and at what times they should be taken. Taking two medicines at the same time can result in blood pressure that is too low.  SEEK IMMEDIATE MEDICAL CARE IF:  You develop a severe headache, blurred or changing vision, or  confusion.   You have unusual weakness or numbness, or a faint feeling.   You have severe chest or abdominal pain, vomiting, or breathing problems.  MAKE SURE YOU:    Understand these instructions.   Will watch your condition.   Will get help right away if you are not doing well or get worse.  Document Released: 12/15/2005 Document Revised: 03/08/2012 Document Reviewed: 08/04/2008 [facility] Patient Information 2013 [place], [place].   Exercise to Lose Weight Exercise and a healthy diet may help you lose weight. Your doctor may suggest specific exercises. EXERCISE IDEAS AND TIPS  Choose low-cost things you enjoy doing, such as walking, bicycling, or exercising to workout videos.   Take stairs instead of the elevator.   Walk during your lunch break.   Park your car further away from work or school.   Go to a gym or an exercise class.   Start with 5 to 10 minutes of exercise each day. Build up to 30 minutes of exercise 4 to 6 days a week.   Wear shoes with good support and comfortable clothes.   Stretch before and after working out.   Work out until you breathe harder and your heart beats faster.   Drink extra water when you exercise.   Do not do so much that you hurt yourself, feel dizzy, or get very short of breath.  Exercises that burn about 150 calories:  Running 1  miles in 15 minutes.   Playing volleyball for 45 to 60 minutes.   Washing and waxing a car for 45 to 60 minutes.   Playing touch football for 45 minutes.   Walking 1  miles in 35 minutes.   Pushing a stroller 1  miles in 30 minutes.   Playing basketball for 30 minutes.   Raking leaves for 30 minutes.   Bicycling 5 miles in 30 minutes.   Walking 2 miles in 30 minutes.   Dancing for 30 minutes.   Shoveling snow for 15 minutes.   Swimming laps for 20 minutes.   Walking up stairs for 15 minutes.   Bicycling 4 miles in 15 minutes.   Gardening for 30 to 45 minutes.   Jumping rope for 15  minutes.   Washing windows or floors for 45 to 60 minutes.  Document Released: 01/17/2011 Document Revised: 03/08/2012 Document Reviewed: 01/17/2011 [facility] Patient Information 2013 [place], [place].

## 2012-12-14 ENCOUNTER — Encounter (HOSPITAL_COMMUNITY): Payer: Self-pay | Admitting: Pharmacy Technician

## 2012-12-20 ENCOUNTER — Encounter (HOSPITAL_COMMUNITY): Payer: Self-pay

## 2012-12-20 ENCOUNTER — Encounter (HOSPITAL_COMMUNITY)
Admission: RE | Admit: 2012-12-20 | Discharge: 2012-12-20 | Disposition: A | Discharge status: Home / Self Care | Payer: 59 | Source: Ambulatory Visit | Attending: Orthopedic Surgery | Admitting: Orthopedic Surgery

## 2012-12-20 ENCOUNTER — Ambulatory Visit (HOSPITAL_COMMUNITY)
Admission: RE | Admit: 2012-12-20 | Discharge: 2012-12-20 | Disposition: A | Discharge status: Home / Self Care | Payer: 59 | Source: Ambulatory Visit | Attending: Orthopedic Surgery | Admitting: Orthopedic Surgery

## 2012-12-20 DIAGNOSIS — Z01818 Encounter for other preprocedural examination: Secondary | ICD-10-CM | POA: Insufficient documentation

## 2012-12-20 LAB — CBC
HCT: 44.2 % (ref 39.0–52.0)
Hemoglobin: 15.2 g/dL (ref 13.0–17.0)
MCH: 29 pg (ref 26.0–34.0)
MCHC: 34.4 g/dL (ref 30.0–36.0)
MCV: 84.4 fL (ref 78.0–100.0)
Platelets: 193 10*3/uL (ref 150–400)
RBC: 5.24 MIL/uL (ref 4.22–5.81)
RDW: 12.5 % (ref 11.5–15.5)
WBC: 4.5 10*3/uL (ref 4.0–10.5)

## 2012-12-20 LAB — URINALYSIS, ROUTINE W REFLEX MICROSCOPIC
Bilirubin Urine: NEGATIVE
Glucose, UA: NEGATIVE mg/dL
Hgb urine dipstick: NEGATIVE
Ketones, ur: NEGATIVE mg/dL
Leukocytes, UA: NEGATIVE
Nitrite: NEGATIVE
Protein, ur: NEGATIVE mg/dL
Specific Gravity, Urine: 1.034 — ABNORMAL HIGH (ref 1.005–1.030)
Urobilinogen, UA: 0.2 mg/dL (ref 0.0–1.0)
pH: 6 (ref 5.0–8.0)

## 2012-12-20 LAB — COMPREHENSIVE METABOLIC PANEL
ALT: 26 U/L (ref 0–53)
AST: 21 U/L (ref 0–37)
Albumin: 4.1 g/dL (ref 3.5–5.2)
Alkaline Phosphatase: 80 U/L (ref 39–117)
BUN: 10 mg/dL (ref 6–23)
CO2: 25 mEq/L (ref 19–32)
Calcium: 9.4 mg/dL (ref 8.4–10.5)
Chloride: 105 mEq/L (ref 96–112)
Creatinine, Ser: 0.74 mg/dL (ref 0.50–1.35)
GFR calc Af Amer: >90 mL/min (ref >90)
GFR calc non Af Amer: >90 mL/min (ref >90)
Glucose, Bld: 136 mg/dL — ABNORMAL HIGH (ref 70–99)
Potassium: 4 mEq/L (ref 3.5–5.1)
Sodium: 139 mEq/L (ref 135–145)
Total Bilirubin: 0.4 mg/dL (ref 0.3–1.2)
Total Protein: 6.9 g/dL (ref 6.0–8.3)

## 2012-12-20 LAB — APTT: aPTT: 33 seconds (ref 24–37)

## 2012-12-20 LAB — PROTIME-INR
INR: 0.98 (ref 0.00–1.49)
Prothrombin Time: 12.9 seconds (ref 11.6–15.2)

## 2012-12-20 LAB — SURGICAL PCR SCREEN
MRSA, PCR: NEGATIVE
Staphylococcus aureus: NEGATIVE

## 2012-12-20 NOTE — Progress Notes (Signed)
12/20/12 1029  OBSTRUCTIVE SLEEP APNEA  Have you ever been diagnosed with sleep apnea through a sleep study? No  Do you snore loudly (loud enough to be heard through closed doors)?  0  Do you often feel tired, fatigued, or sleepy during the daytime? 0  Has anyone observed you stop breathing during your sleep? 0  Do you have, or are you being treated for high blood pressure? 1  BMI more than 35 kg/m2? 1  Age over 58 years old? 1  Neck circumference greater than 40 cm/18 inches? 1  Gender: 1  Obstructive Sleep Apnea Score 5   Score 4 or greater  Results sent to PCP

## 2012-12-20 NOTE — Patient Instructions (Addendum)
20 JABREE REBERT  12/20/2012   Your procedure is scheduled on: 12/27/12  Report to [facility] at 08:45AM.  Call this number if you have problems the morning of surgery [phone]   Remember:   Do not eat food or drink liquids After Midnight.     Take these medicines the morning of surgery with A SIP OF WATER: amlodipine, synthroid   Do not wear jewelry, make-up or nail polish.  Do not wear lotions, powders, or perfumes. You may wear deodorant.  Do not shave 48 hours prior to surgery. Men may shave face and neck.  Do not bring valuables to the hospital.  Contacts, dentures or bridgework may not be worn into surgery.  Leave suitcase in the car. After surgery it may be brought to your room.  For patients admitted to the hospital, checkout time is 11:00 AM the day of discharge.     Special Instructions: Shower using CHG 2 nights before surgery and the night before surgery.  If you shower the day of surgery use CHG.  Use special wash - you have one bottle of CHG for all showers.  You should use approximately 1/3 of the bottle for each shower.   Please read over the following fact sheets that you were given: MRSA Information, blood fact sheet, incentive spirometer fact sheet.  Birdie Sons, RN  pre op nurse call if needed [phone]    FAILURE TO FOLLOW THESE INSTRUCTIONS MAY RESULT IN CANCELLATION OF YOUR SURGERY   Patient Signature: ___________________________________________

## 2012-12-20 NOTE — Progress Notes (Signed)
Abnormal UA routed to Dr. Lequita Halt via [facility]

## 2012-12-20 NOTE — Progress Notes (Signed)
Surgery Clearance note from Dr. Felicity Coyer on chart, EKG 12/02/12 on chart, last office visit Dr. Felicity Coyer 12/02/12 on chart

## 2012-12-26 ENCOUNTER — Other Ambulatory Visit: Payer: Self-pay | Admitting: Orthopedic Surgery

## 2012-12-26 NOTE — H&P (Signed)
[name]  DOB: 05/10/1951 Married / Language: English / Race: White Male  Date of Admission:  12/27/2012  Chief Complaint:  Bilateral Knee Pain  History of Present Illness The patient is a 61 year old male who comes in for a preoperative History and Physical. The patient is scheduled for a bilateral total knee arthroplasty to be performed by Dr. Frank V. Aluisio, MD at [facility] on 12/27/2012. The patient is a 61 year old male who presents for follow up of their knee. The patient is being followed for their bilateral osteoarthritis. They are now seceral month(s) out from cortisone injections. Symptoms reported today include: pain (His left knee is more painful than the right. It seems to hurt more on the posterior side and has limited mobility. His right knee hurts on the anterior side.). The following medication has been used for pain control: Aleve. Both knees are hurting badly. He has had limited function and limited motion in both. Injections helped for only a short amount of time. He feels as though the knees are definitely limiting what he can and cannot do. He is at a stage where he wants to get them fixed. They have been treated conservatively in the past for the above stated problem and despite conservative measures, they continue to have progressive pain and severe functional limitations and dysfunction. They have failed non-operative management including home exercise, medications. It is felt that they would benefit from undergoing total joint replacement. Risks and benefits of the procedure have been discussed with the patient and they elect to proceed with surgery. There are no active contraindications to surgery such as ongoing infection or rapidly progressive neurological disease.   Problem List Primary osteoarthritis of both knees (715.16)  Allergies No Known Drug Allergies   Family History Cancer. father and sister Diabetes Mellitus.  father Heart Disease. father Hypertension. father Osteoarthritis. father   Social History Tobacco use. Never smoker. never smoker Alcohol use. current drinker; drinks beer; less than 5 per week Children. 2 Current work status. working part time Drug/Alcohol Rehab (Currently). no Drug/Alcohol Rehab (Previously). no Exercise. Exercises weekly; does other Illicit drug use. no Living situation. live with spouse Marital status. married Number of flights of stairs before winded. greater than 5 Pain Contract. no Tobacco / smoke exposure. no   Medication History Alfuzosin HCl ER (10MG Tablet ER 24HR, Oral) Active. Aspirin EC (81MG Tablet DR, Oral) Active. Vitamin D3 (1000UNIT Capsule, Oral) Active. Levothyroxine Sodium (125MCG Tablet, Oral) Active. Multiple Vitamin ( Oral) Specific dose unknown - Active. Fish Oil Burp-Less (1000MG Capsule, Oral) Active. AmLODIPine Besylate (5MG Tablet, Oral) Active.   Past Surgical History Arthroscopy of Knee. bilateral Spinal Surgery Straighten Nasal Septum Tonsillectomy Total Hip Replacement. right  Medical History Hypothyroidism Urinary Frequency   Review of Systems General:Not Present- Chills, Fever, Night Sweats, Appetite Loss, Fatigue, Feeling sick, Weight Gain and Weight Loss. Skin:Not Present- Itching, Rash, Skin Color Changes, Ulcer, Psoriasis and Change in Hair or Nails. HEENT:Not Present- Sensitivity to light, Hearing problems, Nose Bleed and Ringing in the Ears. Neck:Not Present- Swollen Glands and Neck Mass. Respiratory:Not Present- Snoring, Chronic Cough, Bloody sputum and Dyspnea. Cardiovascular:Not Present- Shortness of Breath, Chest Pain, Swelling of Extremities, Leg Cramps and Palpitations. Gastrointestinal:Not Present- Bloody Stool, Heartburn, Abdominal Pain, Vomiting, Nausea and Incontinence of Stool. Male Genitourinary:Present- Frequency and Nocturia. Not Present- Blood in Urine and  Incontinence. Musculoskeletal:Present- Joint Stiffness, Joint Pain and Back Pain. Not Present- Muscle Weakness, Muscle Pain and Joint Swelling. Neurological:Not Present-   Tingling, Numbness, Burning, Tremor, Headaches and Dizziness. Psychiatric:Not Present- Anxiety, Depression and Memory Loss. Endocrine:Not Present- Cold Intolerance, Heat Intolerance, Excessive hunger and Excessive Thirst. Hematology:Not Present- Abnormal Bleeding, Anemia, Blood Clots and Easy Bruising.   Vitals Weight: 265 lb Height: 72 in Weight was reported by patient. Height was reported by patient. Body Surface Area: 2.47 m Body Mass Index: 35.94 kg/m Pulse: 84 (Regular) Resp.: 12 (Unlabored) BP: 140/90 (Sitting, Right Arm, Standard)    Physical Exam The physical exam findings are as follows:   General Mental Status - Alert, cooperative and good historian. General Appearance- pleasant. Not in acute distress. Orientation- Oriented X3. Build & Nutrition- Well nourished and Well developed.   Head and Neck Head- normocephalic, atraumatic . Neck Global Assessment- supple. no bruit auscultated on the right and no bruit auscultated on the left.   Eye Pupil- Bilateral- Regular and Round. Motion- Bilateral- EOMI. wears contacts  Chest and Lung Exam Auscultation: Breath sounds:- clear at anterior chest wall and - clear at posterior chest wall. Adventitious sounds:- No Adventitious sounds.   Cardiovascular Auscultation:Rhythm- Regular rate and rhythm. Heart Sounds- S1 WNL and S2 WNL. Murmurs & Other Heart Sounds:Auscultation of the heart reveals - No Murmurs.   Abdomen Inspection:Contour- Generalized moderate distention. Palpation/Percussion:Tenderness- Abdomen is non-tender to palpation. Rigidity (guarding)- Abdomen is soft. Auscultation:Auscultation of the abdomen reveals - Bowel sounds normal.   Male Genitourinary Not done, not pertinent to  present illness  Musculoskeletal Well developed male, alert and oriented in no apparent distress. Both hips show good motion with no pain on range of motion. His knees showed no effusion. Range is about 10 to 120 on each side. There is marked crepitus on range of motion of each. He is tender medial greater than lateral. There is no instability. He has slight varus deformities of each.  RADIOGRAPHS: AP of both knees and lateral show advanced endstage arthritic change in both knees, bone on bone medial and patellofemoral.  Assessment & Plan Primary osteoarthritis of both knees (715.16) Impression: Bilateral Knees  Note: Plan is for a bilateral total knee replacement procedure by Dr. Aluisio.  Patient would like to look into [facility].  Signed electronically by DREW L PERKINS, PA-C 

## 2012-12-27 ENCOUNTER — Inpatient Hospital Stay (HOSPITAL_COMMUNITY)
Admission: RE | Admit: 2012-12-27 | Discharge: 2012-12-30 | DRG: 462 | Disposition: A | Discharge status: Rehabilitation Facility | Payer: 59 | Source: Ambulatory Visit | Attending: Orthopedic Surgery | Admitting: Orthopedic Surgery

## 2012-12-27 ENCOUNTER — Encounter (HOSPITAL_COMMUNITY): Payer: Self-pay | Admitting: *Deleted

## 2012-12-27 ENCOUNTER — Encounter (HOSPITAL_COMMUNITY): Payer: Self-pay | Admitting: Anesthesiology

## 2012-12-27 ENCOUNTER — Encounter (HOSPITAL_COMMUNITY)
Admission: RE | Disposition: A | Discharge status: Rehabilitation Facility | Payer: Self-pay | Source: Ambulatory Visit | Attending: Orthopedic Surgery

## 2012-12-27 ENCOUNTER — Inpatient Hospital Stay (HOSPITAL_COMMUNITY): Payer: 59 | Admitting: Anesthesiology

## 2012-12-27 DIAGNOSIS — Z96649 Presence of unspecified artificial hip joint: Secondary | ICD-10-CM

## 2012-12-27 DIAGNOSIS — E039 Hypothyroidism, unspecified: Secondary | ICD-10-CM | POA: Diagnosis present

## 2012-12-27 DIAGNOSIS — M1710 Unilateral primary osteoarthritis, unspecified knee: Principal | ICD-10-CM | POA: Diagnosis present

## 2012-12-27 DIAGNOSIS — D62 Acute posthemorrhagic anemia: Secondary | ICD-10-CM | POA: Diagnosis not present

## 2012-12-27 DIAGNOSIS — Z791 Long term (current) use of non-steroidal anti-inflammatories (NSAID): Secondary | ICD-10-CM

## 2012-12-27 DIAGNOSIS — N400 Enlarged prostate without lower urinary tract symptoms: Secondary | ICD-10-CM | POA: Diagnosis present

## 2012-12-27 DIAGNOSIS — Z96659 Presence of unspecified artificial knee joint: Secondary | ICD-10-CM

## 2012-12-27 DIAGNOSIS — E669 Obesity, unspecified: Secondary | ICD-10-CM | POA: Diagnosis present

## 2012-12-27 DIAGNOSIS — E871 Hypo-osmolality and hyponatremia: Secondary | ICD-10-CM | POA: Diagnosis not present

## 2012-12-27 DIAGNOSIS — I10 Essential (primary) hypertension: Secondary | ICD-10-CM | POA: Diagnosis present

## 2012-12-27 DIAGNOSIS — G4700 Insomnia, unspecified: Secondary | ICD-10-CM | POA: Diagnosis not present

## 2012-12-27 DIAGNOSIS — M179 Osteoarthritis of knee, unspecified: Secondary | ICD-10-CM | POA: Diagnosis present

## 2012-12-27 DIAGNOSIS — Z6837 Body mass index (BMI) 37.0-37.9, adult: Secondary | ICD-10-CM

## 2012-12-27 DIAGNOSIS — K5900 Constipation, unspecified: Secondary | ICD-10-CM | POA: Diagnosis not present

## 2012-12-27 DIAGNOSIS — Z8261 Family history of arthritis: Secondary | ICD-10-CM

## 2012-12-27 DIAGNOSIS — Z7982 Long term (current) use of aspirin: Secondary | ICD-10-CM

## 2012-12-27 HISTORY — PX: TOTAL KNEE ARTHROPLASTY: SHX125

## 2012-12-27 LAB — TYPE AND SCREEN
ABO/RH(D): O POS
Antibody Screen: NEGATIVE

## 2012-12-27 SURGERY — ARTHROPLASTY, KNEE, BILATERAL, TOTAL
Anesthesia: Spinal | Site: Knee | Laterality: Bilateral | Wound class: Clean

## 2012-12-27 MED ORDER — FENTANYL CITRATE 0.05 MG/ML IJ SOLN
INTRAMUSCULAR | Status: DC | PRN
  Administered 2012-12-27 (×2): 50 ug via INTRAVENOUS

## 2012-12-27 MED ORDER — SODIUM CHLORIDE 0.9 % IV SOLN
INTRAVENOUS | Status: DC
  Administered 2012-12-27 (×2): via INTRAVENOUS
  Administered 2012-12-28: 30 mL/h via INTRAVENOUS
  Administered 2012-12-28: via INTRAVENOUS

## 2012-12-27 MED ORDER — SODIUM CHLORIDE 0.9 % IV SOLN: INTRAVENOUS | Status: DC

## 2012-12-27 MED ORDER — FLEET ENEMA 7-19 GM/118ML RE ENEM: 1.0000 | ENEMA | Freq: Once | RECTAL | Status: AC | PRN

## 2012-12-27 MED ORDER — ONDANSETRON HCL 4 MG/2ML IJ SOLN
INTRAMUSCULAR | Status: DC | PRN
  Administered 2012-12-27: 4 mg via INTRAVENOUS

## 2012-12-27 MED ORDER — OXYCODONE HCL 5 MG PO TABS
5.0000 mg | ORAL_TABLET | ORAL | Status: DC | PRN
  Administered 2012-12-27 (×2): 5 mg via ORAL
  Administered 2012-12-27 – 2012-12-30 (×12): 10 mg via ORAL
  Filled 2012-12-27 (×4): qty 2
  Filled 2012-12-27: qty 1
  Filled 2012-12-27 (×5): qty 2
  Filled 2012-12-27: qty 1
  Filled 2012-12-27 (×4): qty 2

## 2012-12-27 MED ORDER — METHOCARBAMOL 100 MG/ML IJ SOLN
500.0000 mg | Freq: Four times a day (QID) | INTRAVENOUS | Status: DC | PRN
  Administered 2012-12-27: 500 mg via INTRAVENOUS
  Filled 2012-12-27: qty 5

## 2012-12-27 MED ORDER — SCOPOLAMINE 1 MG/3DAYS TD PT72
1.0000 | MEDICATED_PATCH | Freq: Once | TRANSDERMAL | Status: DC
  Filled 2012-12-27: qty 1

## 2012-12-27 MED ORDER — MIDAZOLAM HCL 5 MG/5ML IJ SOLN
INTRAMUSCULAR | Status: DC | PRN
  Administered 2012-12-27: 1 mg via INTRAVENOUS
  Administered 2012-12-27: 2 mg via INTRAVENOUS
  Administered 2012-12-27: 1 mg via INTRAVENOUS

## 2012-12-27 MED ORDER — DEXAMETHASONE SODIUM PHOSPHATE 10 MG/ML IJ SOLN
10.0000 mg | Freq: Once | INTRAMUSCULAR | Status: DC
  Filled 2012-12-27: qty 1

## 2012-12-27 MED ORDER — ALFUZOSIN HCL ER 10 MG PO TB24
10.0000 mg | ORAL_TABLET | Freq: Every day | ORAL | Status: DC
  Administered 2012-12-27 – 2012-12-30 (×4): 10 mg via ORAL
  Filled 2012-12-27 (×5): qty 1

## 2012-12-27 MED ORDER — PROMETHAZINE HCL 25 MG/ML IJ SOLN: 6.2500 mg | INTRAMUSCULAR | Status: DC | PRN

## 2012-12-27 MED ORDER — SODIUM CHLORIDE 0.9 % IR SOLN
Status: DC | PRN
  Administered 2012-12-27: 1000 mL

## 2012-12-27 MED ORDER — ONDANSETRON HCL 4 MG PO TABS: 4.0000 mg | ORAL_TABLET | Freq: Four times a day (QID) | ORAL | Status: DC | PRN

## 2012-12-27 MED ORDER — MEPERIDINE HCL 50 MG/ML IJ SOLN: 6.2500 mg | INTRAMUSCULAR | Status: DC | PRN

## 2012-12-27 MED ORDER — KETAMINE HCL 10 MG/ML IJ SOLN
INTRAMUSCULAR | Status: DC | PRN
  Administered 2012-12-27 (×8): 10 mg via INTRAVENOUS
  Administered 2012-12-27: 20 mg via INTRAVENOUS

## 2012-12-27 MED ORDER — DIPHENHYDRAMINE HCL 25 MG PO CAPS
25.0000 mg | ORAL_CAPSULE | ORAL | Status: DC | PRN
  Filled 2012-12-27: qty 1

## 2012-12-27 MED ORDER — MORPHINE SULFATE 2 MG/ML IJ SOLN: 1.0000 mg | INTRAMUSCULAR | Status: DC | PRN

## 2012-12-27 MED ORDER — DEXTROSE 5 % IV SOLN
3.0000 g | INTRAVENOUS | Status: AC
  Administered 2012-12-27: 3 g via INTRAVENOUS

## 2012-12-27 MED ORDER — LIDOCAINE-EPINEPHRINE (PF) 1.5 %-1:200000 IJ SOLN
INTRAMUSCULAR | Status: DC | PRN
  Administered 2012-12-27: 3 mL

## 2012-12-27 MED ORDER — FENTANYL CITRATE 0.05 MG/ML IJ SOLN: 25.0000 ug | INTRAMUSCULAR | Status: DC | PRN

## 2012-12-27 MED ORDER — PROPOFOL 10 MG/ML IV EMUL
INTRAVENOUS | Status: DC | PRN
  Administered 2012-12-27: 30 mg via INTRAVENOUS

## 2012-12-27 MED ORDER — BUPIVACAINE-EPINEPHRINE PF 0.25-1:200000 % IJ SOLN
INTRAMUSCULAR | Status: DC | PRN
  Administered 2012-12-27 (×3): 4 mL

## 2012-12-27 MED ORDER — PHENOL 1.4 % MT LIQD
1.0000 | OROMUCOSAL | Status: DC | PRN
  Filled 2012-12-27: qty 177

## 2012-12-27 MED ORDER — DIPHENHYDRAMINE HCL 12.5 MG/5ML PO ELIX: 12.5000 mg | ORAL_SOLUTION | ORAL | Status: DC | PRN

## 2012-12-27 MED ORDER — SODIUM CHLORIDE 0.9 % IV SOLN
12.0000 mL/h | INTRAVENOUS | Status: DC
  Administered 2012-12-27 – 2012-12-28 (×2): 12 mL/h via EPIDURAL
  Filled 2012-12-27 (×4): qty 17

## 2012-12-27 MED ORDER — ACETAMINOPHEN 10 MG/ML IV SOLN
1000.0000 mg | Freq: Once | INTRAVENOUS | Status: AC
  Administered 2012-12-27: 1000 mg via INTRAVENOUS

## 2012-12-27 MED ORDER — METOCLOPRAMIDE HCL 5 MG/ML IJ SOLN
10.0000 mg | Freq: Three times a day (TID) | INTRAMUSCULAR | Status: DC | PRN
  Administered 2012-12-28: 10 mg via INTRAVENOUS

## 2012-12-27 MED ORDER — PROPOFOL 10 MG/ML IV EMUL
INTRAVENOUS | Status: DC | PRN
  Administered 2012-12-27: 140 ug/kg/min via INTRAVENOUS

## 2012-12-27 MED ORDER — BUPIVACAINE HCL (PF) 0.75 % IJ SOLN
INTRAMUSCULAR | Status: DC | PRN
  Administered 2012-12-27: 1.8 mL via INTRATHECAL

## 2012-12-27 MED ORDER — CEFAZOLIN SODIUM-DEXTROSE 2-3 GM-% IV SOLR
2.0000 g | Freq: Four times a day (QID) | INTRAVENOUS | Status: AC
  Administered 2012-12-27 – 2012-12-28 (×2): 2 g via INTRAVENOUS
  Filled 2012-12-27 (×2): qty 50

## 2012-12-27 MED ORDER — DEXAMETHASONE 4 MG PO TABS
10.0000 mg | ORAL_TABLET | Freq: Once | ORAL | Status: DC
  Filled 2012-12-27: qty 2.5

## 2012-12-27 MED ORDER — DIPHENHYDRAMINE HCL 50 MG/ML IJ SOLN: 25.0000 mg | INTRAMUSCULAR | Status: DC | PRN

## 2012-12-27 MED ORDER — ACETAMINOPHEN 10 MG/ML IV SOLN
1000.0000 mg | Freq: Four times a day (QID) | INTRAVENOUS | Status: AC
  Administered 2012-12-27 – 2012-12-28 (×4): 1000 mg via INTRAVENOUS
  Filled 2012-12-27 (×8): qty 100

## 2012-12-27 MED ORDER — LEVOTHYROXINE SODIUM 125 MCG PO TABS
125.0000 ug | ORAL_TABLET | Freq: Every day | ORAL | Status: DC
  Administered 2012-12-28 – 2012-12-30 (×3): 125 ug via ORAL
  Filled 2012-12-27 (×4): qty 1

## 2012-12-27 MED ORDER — NALBUPHINE HCL 10 MG/ML IJ SOLN
5.0000 mg | INTRAMUSCULAR | Status: DC | PRN
  Filled 2012-12-27: qty 1

## 2012-12-27 MED ORDER — MENTHOL 3 MG MT LOZG
1.0000 | LOZENGE | OROMUCOSAL | Status: DC | PRN
  Filled 2012-12-27: qty 9

## 2012-12-27 MED ORDER — SODIUM CHLORIDE 0.9 % IJ SOLN: 3.0000 mL | INTRAMUSCULAR | Status: DC | PRN

## 2012-12-27 MED ORDER — KETOROLAC TROMETHAMINE 60 MG/2ML IM SOLN
60.0000 mg | Freq: Once | INTRAMUSCULAR | Status: AC | PRN
  Filled 2012-12-27: qty 2

## 2012-12-27 MED ORDER — ONDANSETRON HCL 4 MG/2ML IJ SOLN: 4.0000 mg | Freq: Four times a day (QID) | INTRAMUSCULAR | Status: DC | PRN

## 2012-12-27 MED ORDER — LACTATED RINGERS IV SOLN
INTRAVENOUS | Status: DC | PRN
  Administered 2012-12-27 (×4): via INTRAVENOUS

## 2012-12-27 MED ORDER — DIPHENHYDRAMINE HCL 50 MG/ML IJ SOLN: 12.5000 mg | INTRAMUSCULAR | Status: DC | PRN

## 2012-12-27 MED ORDER — SODIUM CHLORIDE 0.9 % IR SOLN
Status: DC | PRN
  Administered 2012-12-27: 3000 mL

## 2012-12-27 MED ORDER — SODIUM CHLORIDE 0.9 % IV SOLN
12.0000 mL/h | INTRAVENOUS | Status: DC
  Administered 2012-12-27: 12 mL/h via EPIDURAL
  Filled 2012-12-27 (×5): qty 17

## 2012-12-27 MED ORDER — TRAMADOL HCL 50 MG PO TABS: 50.0000 mg | ORAL_TABLET | Freq: Four times a day (QID) | ORAL | Status: DC | PRN

## 2012-12-27 MED ORDER — METOCLOPRAMIDE HCL 5 MG/ML IJ SOLN
5.0000 mg | Freq: Three times a day (TID) | INTRAMUSCULAR | Status: DC | PRN
  Filled 2012-12-27: qty 2

## 2012-12-27 MED ORDER — DOCUSATE SODIUM 100 MG PO CAPS
100.0000 mg | ORAL_CAPSULE | Freq: Two times a day (BID) | ORAL | Status: DC
  Administered 2012-12-27 – 2012-12-30 (×7): 100 mg via ORAL

## 2012-12-27 MED ORDER — NALOXONE HCL 0.4 MG/ML IJ SOLN: 0.4000 mg | INTRAMUSCULAR | Status: DC | PRN

## 2012-12-27 MED ORDER — METHOCARBAMOL 500 MG PO TABS
500.0000 mg | ORAL_TABLET | Freq: Four times a day (QID) | ORAL | Status: DC | PRN
  Administered 2012-12-28 – 2012-12-30 (×3): 500 mg via ORAL
  Filled 2012-12-27 (×3): qty 1

## 2012-12-27 MED ORDER — NALOXONE HCL 1 MG/ML IJ SOLN: 1.0000 ug/kg/h | INTRAVENOUS | Status: DC | PRN

## 2012-12-27 MED ORDER — ACETAMINOPHEN 650 MG RE SUPP: 650.0000 mg | Freq: Four times a day (QID) | RECTAL | Status: DC | PRN

## 2012-12-27 MED ORDER — METOCLOPRAMIDE HCL 10 MG PO TABS: 5.0000 mg | ORAL_TABLET | Freq: Three times a day (TID) | ORAL | Status: DC | PRN

## 2012-12-27 MED ORDER — SODIUM CHLORIDE 0.9 % IJ SOLN
3.0000 mL | Freq: Three times a day (TID) | INTRAMUSCULAR | Status: DC
Start: 2012-12-27 — End: 2012-12-27
  Administered 2012-12-27: 3 mL via INTRAVENOUS

## 2012-12-27 MED ORDER — ONDANSETRON HCL 4 MG/2ML IJ SOLN: 4.0000 mg | Freq: Three times a day (TID) | INTRAMUSCULAR | Status: DC | PRN

## 2012-12-27 MED ORDER — ACETAMINOPHEN 325 MG PO TABS
650.0000 mg | ORAL_TABLET | Freq: Four times a day (QID) | ORAL | Status: DC | PRN
  Administered 2012-12-30: 650 mg via ORAL
  Filled 2012-12-27: qty 2

## 2012-12-27 MED ORDER — AMLODIPINE BESYLATE 5 MG PO TABS
5.0000 mg | ORAL_TABLET | Freq: Every day | ORAL | Status: DC
  Administered 2012-12-28 – 2012-12-30 (×3): 5 mg via ORAL
  Filled 2012-12-27 (×5): qty 1

## 2012-12-27 MED ORDER — BISACODYL 10 MG RE SUPP: 10.0000 mg | Freq: Every day | RECTAL | Status: DC | PRN

## 2012-12-27 MED ORDER — BUPIVACAINE ON-Q PAIN PUMP (FOR ORDER SET NO CHG)
INJECTION | Status: DC
  Filled 2012-12-27: qty 1

## 2012-12-27 MED ORDER — POLYETHYLENE GLYCOL 3350 17 G PO PACK: 17.0000 g | PACK | Freq: Every day | ORAL | Status: DC | PRN

## 2012-12-27 SURGICAL SUPPLY — 65 items
AUTOTRANSFUSION W/QD PVC DRAIN (AUTOTRANSFUSION) ×4 IMPLANT
BAG ZIPLOCK 12X15 (MISCELLANEOUS) ×4 IMPLANT
BANDAGE ELASTIC 6 VELCRO ST LF (GAUZE/BANDAGES/DRESSINGS) ×2 IMPLANT
BANDAGE ESMARK 6X9 LF (GAUZE/BANDAGES/DRESSINGS) ×2 IMPLANT
BLADE SAG 18X100X1.27 (BLADE) ×4 IMPLANT
BLADE SAW SGTL 11.0X1.19X90.0M (BLADE) ×4 IMPLANT
BLADE SURG SZ10 CARB STEEL (BLADE) ×4 IMPLANT
BNDG COHESIVE 6X5 TAN STRL LF (GAUZE/BANDAGES/DRESSINGS) ×4 IMPLANT
BNDG ESMARK 6X9 LF (GAUZE/BANDAGES/DRESSINGS) ×4
BOWL SMART MIX CTS (DISPOSABLE) ×4 IMPLANT
CEMENT HV SMART SET (Cement) ×8 IMPLANT
CLOTH BEACON ORANGE TIMEOUT ST (SAFETY) ×2 IMPLANT
CUFF TOURN SGL QUICK 34 (TOURNIQUET CUFF) ×2
CUFF TRNQT CYL 34X4X40X1 (TOURNIQUET CUFF) ×2 IMPLANT
DRAPE EXTREMITY BILATERAL (DRAPE) ×2 IMPLANT
DRAPE INCISE IOBAN 66X45 STRL (DRAPES) ×2 IMPLANT
DRAPE POUCH INSTRU U-SHP 10X18 (DRAPES) ×2 IMPLANT
DRAPE U-SHAPE 47X51 STRL (DRAPES) ×6 IMPLANT
DRSG ADAPTIC 3X8 NADH LF (GAUZE/BANDAGES/DRESSINGS) ×2 IMPLANT
DRSG PAD ABDOMINAL 8X10 ST (GAUZE/BANDAGES/DRESSINGS) ×2 IMPLANT
DURAPREP 26ML APPLICATOR (WOUND CARE) ×4 IMPLANT
ELECT REM PT RETURN 9FT ADLT (ELECTROSURGICAL) ×2
ELECTRODE REM PT RTRN 9FT ADLT (ELECTROSURGICAL) ×1 IMPLANT
EVACUATOR 1/8 PVC DRAIN (DRAIN) ×4 IMPLANT
FACESHIELD LNG OPTICON STERILE (SAFETY) ×16 IMPLANT
GLOVE BIO SURGEON STRL SZ7.5 (GLOVE) IMPLANT
GLOVE BIO SURGEON STRL SZ8 (GLOVE) ×4 IMPLANT
GLOVE BIOGEL PI IND STRL 7.0 (GLOVE) ×1 IMPLANT
GLOVE BIOGEL PI IND STRL 7.5 (GLOVE) ×1 IMPLANT
GLOVE BIOGEL PI IND STRL 8 (GLOVE) ×1 IMPLANT
GLOVE BIOGEL PI INDICATOR 7.0 (GLOVE) ×1
GLOVE BIOGEL PI INDICATOR 7.5 (GLOVE) ×1
GLOVE BIOGEL PI INDICATOR 8 (GLOVE) ×1
GLOVE ECLIPSE 8.0 STRL XLNG CF (GLOVE) IMPLANT
GLOVE SURG SS PI 6.5 STRL IVOR (GLOVE) ×10 IMPLANT
GLOVE SURG SS PI 7.5 STRL IVOR (GLOVE) ×6 IMPLANT
GOWN STRL NON-REIN LRG LVL3 (GOWN DISPOSABLE) ×4 IMPLANT
GOWN STRL REIN 2XL LVL4 (GOWN DISPOSABLE) ×2 IMPLANT
GOWN STRL REIN XL XLG (GOWN DISPOSABLE) ×4 IMPLANT
HANDPIECE INTERPULSE COAX TIP (DISPOSABLE) ×2
IMMOBILIZER KNEE 20 (SOFTGOODS) ×2
IMMOBILIZER KNEE 20 THIGH 36 (SOFTGOODS) ×1 IMPLANT
KIT BASIN OR (CUSTOM PROCEDURE TRAY) ×2 IMPLANT
MANIFOLD NEPTUNE II (INSTRUMENTS) ×2 IMPLANT
NDL SAFETY ECLIPSE 18X1.5 (NEEDLE) ×1 IMPLANT
NEEDLE HYPO 18GX1.5 SHARP (NEEDLE) ×1
NS IRRIG 1000ML POUR BTL (IV SOLUTION) ×2 IMPLANT
PACK TOTAL JOINT (CUSTOM PROCEDURE TRAY) ×2 IMPLANT
PADDING CAST COTTON 6X4 STRL (CAST SUPPLIES) ×4 IMPLANT
SET HNDPC FAN SPRY TIP SCT (DISPOSABLE) ×2 IMPLANT
SPONGE GAUZE 4X4 12PLY (GAUZE/BANDAGES/DRESSINGS) ×6 IMPLANT
SPONGE LAP 18X18 X RAY DECT (DISPOSABLE) IMPLANT
STOCKINETTE 8 INCH (MISCELLANEOUS) ×2 IMPLANT
STRIP CLOSURE SKIN 1/2X4 (GAUZE/BANDAGES/DRESSINGS) ×4 IMPLANT
SUCTION FRAZIER 12FR DISP (SUCTIONS) ×2 IMPLANT
SUT MNCRL AB 4-0 PS2 18 (SUTURE) ×4 IMPLANT
SUT VIC AB 2-0 CT1 27 (SUTURE) ×6
SUT VIC AB 2-0 CT1 TAPERPNT 27 (SUTURE) ×6 IMPLANT
SUT VLOC 180 0 24IN GS25 (SUTURE) ×4 IMPLANT
SYR 50ML LL SCALE MARK (SYRINGE) ×2 IMPLANT
TOWEL OR 17X26 10 PK STRL BLUE (TOWEL DISPOSABLE) ×4 IMPLANT
TOWEL OR NON WOVEN STRL DISP B (DISPOSABLE) ×2 IMPLANT
TRAY FOLEY CATH 14FRSI W/METER (CATHETERS) ×2 IMPLANT
WATER STERILE IRR 1500ML POUR (IV SOLUTION) ×2 IMPLANT
WRAP KNEE MAXI GEL POST OP (GAUZE/BANDAGES/DRESSINGS) ×8 IMPLANT

## 2012-12-27 NOTE — Anesthesia Procedure Notes (Addendum)
Epidural Patient location during procedure: OR Start time: 12/27/2012 11:27 AM End time: 12/27/2012 11:27 AM  Staffing Anesthesiologist: ROSE, Greggory Stallion Performed by: anesthesiologist   Preanesthetic Checklist Completed: patient identified, site marked, surgical consent, pre-op evaluation, timeout performed, IV checked, risks and benefits discussed and monitors and equipment checked  Epidural Patient position: sitting Prep: Betadine Patient monitoring: heart rate, continuous pulse ox and blood pressure Approach: midline Injection technique: LOR air  Needle:  Needle type: Hustead  Needle gauge: 17 G Needle length: 9 cm and 9 Needle insertion depth: 8 cm Catheter type: closed end flexible Catheter size: 20 Guage Catheter at skin depth: 15 cm Test dose: negative and 2% lidocaine with Epi 1:200 K  Additional Notes Test dose 1.5% Lidocaine with epi 1:200,000  Patient tolerated the insertion well without complications.Reason for block:post-op pain management  Spinal  Patient location during procedure: OR Start time: 12/27/2012 11:28 AM End time: 12/27/2012 11:28 AM Staffing Performed by: anesthesiologist  Preanesthetic Checklist Completed: patient identified, site marked, surgical consent, pre-op evaluation, timeout performed, IV checked, risks and benefits discussed and monitors and equipment checked Spinal Block Patient position: sitting Prep: Betadine Patient monitoring: heart rate, continuous pulse ox and blood pressure Injection technique: single-shot Needle Needle type: Spinocan  Needle gauge: 25 G Needle length: 10 cm Additional Notes Expiration date of kit checked and confirmed. Patient tolerated procedure well, without complications.

## 2012-12-27 NOTE — Anesthesia Postprocedure Evaluation (Signed)
  Anesthesia Post-op Note  Patient: [name]  Procedure(s) Performed: Procedure(s) (LRB): TOTAL KNEE BILATERAL (Bilateral)  Patient Location: PACU  Anesthesia Type: Epidural  Level of Consciousness: awake and alert   Airway and Oxygen Therapy: Patient Spontanous Breathing  Post-op Pain: mild  Post-op Assessment: Post-op Vital signs reviewed, Patient's Cardiovascular Status Stable, Respiratory Function Stable, Patent Airway and No signs of Nausea or vomiting  Last Vitals:  Filed Vitals:   12/27/12 1406  BP: 142/78  Pulse: 65  Temp: 36.4 C  Resp: 18    Post-op Vital Signs: stable   Complications: No apparent anesthesia complications

## 2012-12-27 NOTE — Interval H&P Note (Signed)
History and Physical Interval Note:  12/27/2012 9:41 AM  [name]  has presented today for surgery, with the diagnosis of Osteoarthritis of Bilateral knees  The various methods of treatment have been discussed with the patient and family. After consideration of risks, benefits and other options for treatment, the patient has consented to  Procedure(s) (LRB) with comments: TOTAL KNEE BILATERAL (Bilateral) as a surgical intervention .  The patient's history has been reviewed, patient examined, no change in status, stable for surgery.  I have reviewed the patient's chart and labs.  Questions were answered to the patient's satisfaction.     Loanne Drilling

## 2012-12-27 NOTE — Clinical Social Work Psychosocial (Signed)
     Clinical Social Work Department BRIEF PSYCHOSOCIAL ASSESSMENT 12/27/2012  Patient:  [name], [name]     Account Number:  [id number]     Admit date:  12/27/2012  Clinical Social Worker:  Jacelyn Grip  Date/Time:  12/27/2012 04:00 PM  Referred by:  RN  Date Referred:  12/27/2012 Referred for  Other - See comment   Other Referral:   Pt family interested CIR   Interview type:  Patient Other interview type:   and patient wife at bedside    PSYCHOSOCIAL DATA Living Status:  WIFE Admitted from facility:   Level of care:   Primary support name:  [name]/wife Primary support relationship to patient:  SPOUSE Degree of support available:   strong    CURRENT CONCERNS Current Concerns  Post-Acute Placement   Other Concerns:    SOCIAL WORK ASSESSMENT / PLAN CSW received notification from RN that pt and pt wife interested in speaking about CIR for pt.    CSW reviewed chart and noted that consult has been placed for CIR to evaluate pt. CSW met with pt and pt wife at bedside to discuss. Pt and pt wife stated that they wanted to ensure that referral had been made to CIR. CSW confirmed that evaluation had been requested. CSW contacted CIR RN, Jola Babinski to notify of consult and Jola Babinski plans to notify Dr. Riley Kill and they plan to complete evaluation. CSW will assist as appropriate.   Assessment/plan status:  Psychosocial Support/Ongoing Assessment of Needs Other assessment/ plan:   discharge planning   Information/referral to community resources:   Referral to CIR    PATIENTS/FAMILYS RESPONSE TO PLAN OF CARE: Pt alert and oriented x 4. Pt and pt wife were eager to make sure that consult had been placed for CIR and appreciative of CSW visit and assistance.

## 2012-12-27 NOTE — Anesthesia Preprocedure Evaluation (Addendum)
Anesthesia Evaluation  Patient identified by MRN, date of birth, ID band Patient awake    Reviewed: Allergy & Precautions, H&P , NPO status , Patient's Chart, lab work & pertinent test results  Airway Mallampati: II TM Distance: >3 FB Neck ROM: Full    Dental No notable dental hx.    Pulmonary neg pulmonary ROS,  breath sounds clear to auscultation  Pulmonary exam normal       Cardiovascular hypertension, Pt. on medications Rhythm:Regular Rate:Normal     Neuro/Psych negative neurological ROS  negative psych ROS   GI/Hepatic negative GI ROS, Neg liver ROS,   Endo/Other  Hypothyroidism   Renal/GU negative Renal ROS  negative genitourinary   Musculoskeletal negative musculoskeletal ROS (+)   Abdominal   Peds negative pediatric ROS (+)  Hematology negative hematology ROS (+)   Anesthesia Other Findings   Reproductive/Obstetrics negative OB ROS                           Anesthesia Physical Anesthesia Plan  ASA: II  Anesthesia Plan: Combined Spinal and Epidural   Post-op Pain Management:    Induction:   Airway Management Planned: Simple Face Mask  Additional Equipment:   Intra-op Plan:   Post-operative Plan:   Informed Consent: I have reviewed the patients History and Physical, chart, labs and discussed the procedure including the risks, benefits and alternatives for the proposed anesthesia with the patient or authorized representative who has indicated his/her understanding and acceptance.     Plan Discussed with: CRNA and Surgeon  Anesthesia Plan Comments:         Anesthesia Quick Evaluation

## 2012-12-27 NOTE — H&P (View-Only) (Signed)
[name]  DOB: October 08, 1951 Married / Language: English / Race: White Male  Date of Admission:  12/27/2012  Chief Complaint:  Bilateral Knee Pain  History of Present Illness The patient is a 61 year old male who comes in for a preoperative History and Physical. The patient is scheduled for a bilateral total knee arthroplasty to be performed by Dr. Gus Rankin. Aluisio, MD at [facility] on 12/27/2012. The patient is a 61 year old male who presents for follow up of their knee. The patient is being followed for their bilateral osteoarthritis. They are now seceral month(s) out from cortisone injections. Symptoms reported today include: pain (His left knee is more painful than the right. It seems to hurt more on the posterior side and has limited mobility. His right knee hurts on the anterior side.). The following medication has been used for pain control: Aleve. Both knees are hurting badly. He has had limited function and limited motion in both. Injections helped for only a short amount of time. He feels as though the knees are definitely limiting what he can and cannot do. He is at a stage where he wants to get them fixed. They have been treated conservatively in the past for the above stated problem and despite conservative measures, they continue to have progressive pain and severe functional limitations and dysfunction. They have failed non-operative management including home exercise, medications. It is felt that they would benefit from undergoing total joint replacement. Risks and benefits of the procedure have been discussed with the patient and they elect to proceed with surgery. There are no active contraindications to surgery such as ongoing infection or rapidly progressive neurological disease.   Problem List Primary osteoarthritis of both knees (715.16)  Allergies No Known Drug Allergies   Family History Cancer. father and sister Diabetes Mellitus.  father Heart Disease. father Hypertension. father Osteoarthritis. father   Social History Tobacco use. Never smoker. never smoker Alcohol use. current drinker; drinks beer; less than 5 per week Children. 2 Current work status. working part time Drug/Alcohol Rehab (Currently). no Drug/Alcohol Rehab (Previously). no Exercise. Exercises weekly; does other Illicit drug use. no Living situation. live with spouse Marital status. married Number of flights of stairs before winded. greater than 5 Pain Contract. no Tobacco / smoke exposure. no   Medication History Alfuzosin HCl ER (10MG  Tablet ER 24HR, Oral) Active. Aspirin EC (81MG  Tablet DR, Oral) Active. Vitamin D3 (1000UNIT Capsule, Oral) Active. Levothyroxine Sodium ( Tablet, Oral) Active. Multiple Vitamin ( Oral) Specific dose unknown - Active. Fish Oil Burp-Less (1000MG  Capsule, Oral) Active. AmLODIPine Besylate (5MG  Tablet, Oral) Active.   Past Surgical History Arthroscopy of Knee. bilateral Spinal Surgery Straighten Nasal Septum Tonsillectomy Total Hip Replacement. right  Medical History Hypothyroidism Urinary Frequency   Review of Systems General:Not Present- Chills, Fever, Night Sweats, Appetite Loss, Fatigue, Feeling sick, Weight Gain and Weight Loss. Skin:Not Present- Itching, Rash, Skin Color Changes, Ulcer, Psoriasis and Change in Hair or Nails. HEENT:Not Present- Sensitivity to light, Hearing problems, Nose Bleed and Ringing in the Ears. Neck:Not Present- Swollen Glands and Neck Mass. Respiratory:Not Present- Snoring, Chronic Cough, Bloody sputum and Dyspnea. Cardiovascular:Not Present- Shortness of Breath, Chest Pain, Swelling of Extremities, Leg Cramps and Palpitations. Gastrointestinal:Not Present- Bloody Stool, Heartburn, Abdominal Pain, Vomiting, Nausea and Incontinence of Stool. Male Genitourinary:Present- Frequency and Nocturia. Not Present- Blood in Urine and  Incontinence. Musculoskeletal:Present- Joint Stiffness, Joint Pain and Back Pain. Not Present- Muscle Weakness, Muscle Pain and Joint Swelling. Neurological:Not Present-  Tingling, Numbness, Burning, Tremor, Headaches and Dizziness. Psychiatric:Not Present- Anxiety, Depression and Memory Loss. Endocrine:Not Present- Cold Intolerance, Heat Intolerance, Excessive hunger and Excessive Thirst. Hematology:Not Present- Abnormal Bleeding, Anemia, Blood Clots and Easy Bruising.   Vitals Weight: 265 lb Height: 72 in Weight was reported by patient. Height was reported by patient. Body Surface Area: 2.47 m Body Mass Index: 35.94 kg/m Pulse: 84 (Regular) Resp.: 12 (Unlabored) BP: 140/90 (Sitting, Right Arm, Standard)    Physical Exam The physical exam findings are as follows:   General Mental Status - Alert, cooperative and good historian. General Appearance- pleasant. Not in acute distress. Orientation- Oriented X3. Build & Nutrition- Well nourished and Well developed.   Head and Neck Head- normocephalic, atraumatic . Neck Global Assessment- supple. no bruit auscultated on the right and no bruit auscultated on the left.   Eye Pupil- Bilateral- Regular and Round. Motion- Bilateral- EOMI. wears contacts  Chest and Lung Exam Auscultation: Breath sounds:- clear at anterior chest wall and - clear at posterior chest wall. Adventitious sounds:- No Adventitious sounds.   Cardiovascular Auscultation:Rhythm- Regular rate and rhythm. Heart Sounds- S1 WNL and S2 WNL. Murmurs & Other Heart Sounds:Auscultation of the heart reveals - No Murmurs.   Abdomen Inspection:Contour- Generalized moderate distention. Palpation/Percussion:Tenderness- Abdomen is non-tender to palpation. Rigidity (guarding)- Abdomen is soft. Auscultation:Auscultation of the abdomen reveals - Bowel sounds normal.   Male Genitourinary Not done, not pertinent to  present illness  Musculoskeletal Well developed male, alert and oriented in no apparent distress. Both hips show good motion with no pain on range of motion. His knees showed no effusion. Range is about 10 to 120 on each side. There is marked crepitus on range of motion of each. He is tender medial greater than lateral. There is no instability. He has slight varus deformities of each.  RADIOGRAPHS: AP of both knees and lateral show advanced endstage arthritic change in both knees, bone on bone medial and patellofemoral.  Assessment & Plan Primary osteoarthritis of both knees (715.16) Impression: Bilateral Knees  Note: Plan is for a bilateral total knee replacement procedure by Dr. Lequita Halt.  Patient would like to look into [facility].  Signed electronically by Roberts Gaudy, PA-C

## 2012-12-27 NOTE — Preoperative (Signed)
Beta Blockers   Reason not to administer Beta Blockers:Not Applicable 

## 2012-12-27 NOTE — Transfer of Care (Signed)
Immediate Anesthesia Transfer of Care Note  Patient: [name]  Procedure(s) Performed: Procedure(s) (LRB) with comments: TOTAL KNEE BILATERAL (Bilateral)  Patient Location: PACU  Anesthesia Type:MAC, Spinal and Epidural  Level of Consciousness: awake, alert , oriented and patient cooperative  Airway & Oxygen Therapy: Patient Spontanous Breathing and Patient connected to face mask oxygen  Post-op Assessment: Report given to PACU RN, Post -op Vital signs reviewed and stable and Patient moving all extremities  Post vital signs: Reviewed and stable  Complications: No apparent anesthesia complications

## 2012-12-27 NOTE — Op Note (Signed)
Pre-operative diagnosis- Osteoarthritis  Bilateral knee(s)  Post-operative diagnosis- Osteoarthritis Bilateral knee(s)  Procedure-  Bilateral  Total Knee Arthroplasty  Surgeon- Gus Rankin. Aluisio, MD  Assistant- Dimitri Ped, PA-C   Anesthesia-  Spinal and Epidural EBL-* No blood loss amount entered *  Drains Autovac each side  Tourniquet time-  Total Tourniquet Time Documented: Thigh (Bilateral) - 48 minutes   Complications- None  Condition-PACU - hemodynamically stable.   Brief Clinical Note  [name] is a 61 y.o. year old male with end stage OA of both knees with progressively worsening pain and dysfunction. He has constant pain, with activity and at rest and significant functional deficits with difficulties even with ADLs. He has had extensive non-op management including analgesics, injections of cortisone and viscosupplements, and home exercise program, but remains in significant pain with significant dysfunction. We discussed doing both in the same setting vs. Staged procedures doing one at a time. After discussing the procedure, risks, potential complications and rehab course involved in both procedures, he elected for bilateral TKA. He presents now for bilateral Total Knee Arthroplasty.    Procedure in detail---   The patient is brought into the operating room and positioned supine on the operating table. After successful administration of  Spinal and Epidural,   a tourniquet is placed high on the  Bilateral thigh(s) and the lower extremity is prepped and draped in the usual sterile fashion. Time out is performed by the operating team and then the  right lower extremitiy is wrapped in Esmarch, knee flexed and the tourniquet inflated to 300 mmHg.       A midline incision is made with a ten blade through the subcutaneous tissue to the level of the extensor mechanism. A fresh blade is used to make a medial parapatellar arthrotomy. Soft tissue over the proximal medial tibia is  subperiosteally elevated to the joint line with a knife and into the semimembranosus bursa with a Cobb elevator. Soft tissue over the proximal lateral tibia is elevated with attention being paid to avoiding the patellar tendon on the tibial tubercle. The patella is everted, knee flexed 90 degrees and the ACL and PCL are removed. Findings are bone on bone medial and patellofemoral with large global osteophytes.        The drill is used to create a starting hole in the distal femur and the canal is thoroughly irrigated with sterile saline to remove the fatty contents. The 5 degree Right  valgus alignment guide is placed into the femoral canal and the distal femoral cutting block is pinned to remove 11 mm off the distal femur. Resection is made with an oscillating saw.      The tibia is subluxed forward and the menisci are removed. The extramedullary alignment guide is placed referencing proximally at the medial aspect of the tibial tubercle and distally along the second metatarsal axis and tibial crest. The block is pinned to remove 2mm off the more deficient medial  side. Resection is made with an oscillating saw. Size 4is the most appropriate size for the tibia and the proximal tibia is prepared with the modular drill and keel punch for that size.      The femoral sizing guide is placed and size 4 is most appropriate. Rotation is marked off the epicondylar axis and confirmed by creating a rectangular flexion gap at 90 degrees. The size 4 cutting block is pinned in this rotation and the anterior, posterior and chamfer cuts are made with the oscillating saw.  The intercondylar block is then placed and that cut is made.      Trial size 4 tibial component, trial size 4 posterior stabilized femur and a 10  mm posterior stabilized rotating platform insert trial is placed. Full extension is achieved with excellent varus/valgus and anterior/posterior balance throughout full range of motion. The patella is everted and  thickness measured to be 24  mm. Free hand resection is taken to 14 mm, a 38 template is placed, lug holes are drilled, trial patella is placed, and it tracks normally. Osteophytes are removed off the posterior femur with the trial in place. All trials are removed and the cut bone surfaces prepared with pulsatile lavage. Cement is mixed and once ready for implantation, the size 4 tibial implant, size  4 posterior stabilized femoral component, and the size 38 patella are cemented in place and the patella is held with the clamp. The trial insert is placed and the knee held in full extension. All extruded cement is removed and once the cement is hard the permanent 10 mm posterior stabilized rotating platform insert is placed into the tibial tray.      The wound is copiously irrigated with saline solution and the extensor mechanism closed over a autovac drain with #1V-loc suture. The tourniquet is released for a total tourniquet time of 48  minutes. Flexion against gravity is 140 degrees and the patella tracks normally. Subcutaneous tissue is closed with 2.0 vicryl and subcuticular with running 4.0 Monocryl.       The  Left lower extremity is wrapped in Esmarch, knee flexed and the tourniquet inflated to 300 mmHg.       A midline incision is made with a ten blade through the subcutaneous tissue to the level of the extensor mechanism. A fresh blade is used to make a medial parapatellar arthrotomy. Soft tissue over the proximal medial tibia is subperiosteally elevated to the joint line with a knife and into the semimembranosus bursa with a Cobb elevator. Soft tissue over the proximal lateral tibia is elevated with attention being paid to avoiding the patellar tendon on the tibial tubercle. The patella is everted, knee flexed 90 degrees and the ACL and PCL are removed. Findings are bone on bone medial and patellofemoral with large global osteophytes.          The drill is used to create a starting hole in the distal  femur and the canal is thoroughly irrigated with sterile saline to remove the fatty contents. The 5 degree Left  valgus alignment guide is placed into the femoral canal and the distal femoral cutting block is pinned to remove 11 mm off the distal femur. Resection is made with an oscillating saw.      The tibia is subluxed forward and the menisci are removed. The extramedullary alignment guide is placed referencing proximally at the medial aspect of the tibial tubercle and distally along the second metatarsal axis and tibial crest. The block is pinned to remove 2mm off the more deficient medial  side. Resection is made with an oscillating saw. Size 4is the most appropriate size for the tibia and the proximal tibia is prepared with the modular drill and keel punch for that size.      The femoral sizing guide is placed and size 4 is most appropriate. Rotation is marked off the epicondylar axis and confirmed by creating a rectangular flexion gap at 90 degrees. The size 4 cutting block is pinned in this rotation and the anterior,  posterior and chamfer cuts are made with the oscillating saw. The intercondylar block is then placed and that cut is made.      Trial size 4 tibial component, trial size 4 posterior stabilized femur and a 10  mm posterior stabilized rotating platform insert trial is placed. Full extension is achieved with excellent varus/valgus and anterior/posterior balance throughout full range of motion. The patella is everted and thickness measured to be 24  mm. Free hand resection is taken to 14 mm, a 38 template is placed, lug holes are drilled, trial patella is placed, and it tracks normally. Osteophytes are removed off the posterior femur with the trial in place. All trials are removed and the cut bone surfaces prepared with pulsatile lavage. Cement is mixed and once ready for implantation, the size 4 tibial implant, size  4 posterior stabilized femoral component, and the size 38 patella are cemented  in place and the patella is held with the clamp. The trial insert is placed and the knee held in full extension. All extruded cement is removed and once the cement is hard the permanent 10 mm posterior stabilized rotating platform insert is placed into the tibial tray.      The wound is copiously irrigated with saline solution and the extensor mechanism closed over a autovac drain with #1 V-loc suture. The tourniquet is released for a total tourniquet time of 48  minutes. Flexion against gravity is 140 degrees and the patella tracks normally. Subcutaneous tissue is closed with 2.0 vicryl and subcuticular with running 4.0 Monocryl.   The incisions are cleaned and dried and steri-strips and a bulky sterile dressing are applied to both knees. The limbs are placed into knee immobilizers and the patient is awakened and transported to recovery in stable condition.      Please note that a surgical assistant was a medical necessity for this procedure in order to perform it in a safe and expeditious manner. Surgical assistant was necessary to retract the ligaments and vital neurovascular structures to prevent injury to them and also necessary for proper positioning of the limb to allow for anatomic placement of the prosthesis.   Gus Rankin Aluisio, MD    12/27/2012, 1:30 PM

## 2012-12-27 NOTE — Consult Note (Signed)
Physical Medicine and Rehabilitation Consult Reason for Consult: B-TKR due to endstage DJD Referring Physician: Dr. Despina Hick.    HPI: [name] is a 61 y.o. male with history of obesity, HTN, bilateral knee pain due to DJD and failure of conservative therapy. Patient elected to undergo B-TKR on 12/27/12 by Dr. Despina Hick. Post op WBAT with CPM for PROM and coumadin for DVT prophylaxis. PT/OT evaluations to be done 12/31. MD recommending CIR.   Review of Systems  HENT: Negative for hearing loss.   Eyes: Negative for blurred vision and double vision.  Respiratory: Negative for cough and shortness of breath.   Cardiovascular: Negative for chest pain and palpitations.  Gastrointestinal: Negative for heartburn and abdominal pain.  Genitourinary: Negative for urgency and frequency.  Musculoskeletal:       Back stiffness.  Neurological: Negative for speech change, focal weakness and headaches.  Psychiatric/Behavioral: The patient is not nervous/anxious and does not have insomnia.    Past Medical History  Diagnosis Date  . COLONIC POLYPS, HX OF 2007  . Arthritis     Knees  . OBESITY   . HYPOTHYROIDISM   . HYPERTENSION   . BPH (benign prostatic hypertrophy)    Past Surgical History  Procedure Date  . Tonsillectomy   . Laminectomy 1991  . Arthroscopic knee surgery     (R) 2003 & (L) 2010  . Total hip arthroplasty 01/2011    R - alusio  . Tonsillectomy    Family History  Problem Relation Age of Onset  . Heart disease Father   . Arthritis Other     Parent & grandparents  . Diabetes Other     parents  . Hypertension Other     parents   Social History: Married. Works part time at Public librarian at [employer].  Wife works full time for [employer]. He reports that he has never smoked. He has never used smokeless tobacco. He reports that he drinks alcohol. He reports that he does not use illicit drugs.  Allergies: No Known Allergies  Medications Prior to Admission  Medication Sig Dispense Refill    . alfuzosin (UROXATRAL) 10 MG 24 hr tablet Take 10 mg by mouth every evening.       Marland Kitchen amLODipine (NORVASC) 5 MG tablet Take 5 mg by mouth daily before breakfast.      . levothyroxine (SYNTHROID, LEVOTHROID) 125 MCG tablet Take 125 mcg by mouth daily before breakfast.      . naproxen sodium (ANAPROX) 220 MG tablet Take 220 mg by mouth. 2 tablets as needed for pain      . aspirin 81 MG tablet Take 81 mg by mouth daily.       . Cholecalciferol (VITAMIN D3) 1000 UNITS CAPS Take 1 capsule by mouth daily.       . Multiple Vitamin (MULTIVITAMIN) tablet Take 1 tablet by mouth daily.       . Omega-3 Fatty Acids (FISH OIL) 1200 MG CAPS Take 1,200 mg by mouth daily.         Home:    Functional History:   Functional Status:  Mobility:          ADL:    Cognition: Cognition Orientation Level: Oriented X4    Blood pressure 136/71, pulse 57, temperature 97.4 F (36.3 C), temperature source Oral, resp. rate 14, weight 124.286 kg (274 lb), SpO2 97.00%. Physical Exam  Nursing note and vitals reviewed. Constitutional: He is oriented to person, place, and time. He appears well-developed and well-nourished.  HENT:  Head: Normocephalic and atraumatic.  Eyes: Pupils are equal, round, and reactive to light.  Neck: Normal range of motion.  Cardiovascular: Normal rate and regular rhythm.   Pulmonary/Chest: Effort normal and breath sounds normal.  Abdominal: Soft. Bowel sounds are normal.  Musculoskeletal: He exhibits no edema.       Compressive dressing B-Knees. BLE neurovascularly  intact. Knees appropriately tender. Wearing KI's  Neurological: He is alert and oriented to person, place, and time.       Intact UE strength, LE inhibited by pain/surgery. Cognitively intact  Skin: Skin is warm and dry.  Psychiatric: He has a normal mood and affect. His behavior is normal. Thought content normal.    No results found for this or any previous visit (from the past 24 hour(s)). No results  found.  Assessment/Plan: Diagnosis: bilateral TKA's for endstage OA 1. Does the need for close, 24 hr/day medical supervision in concert with the patient's rehab needs make it unreasonable for this patient to be served in a less intensive setting? Yes 2. Co-Morbidities requiring supervision/potential complications: abla, htn, pain 3. Due to bladder management, bowel management, safety, skin/wound care, disease management, medication administration, pain management and patient education, does the patient require 24 hr/day rehab nursing? Yes 4. Does the patient require coordinated care of a physician, rehab nurse, PT (1-2 hrs/day, 5 days/week) and OT (1-2 hrs/day, 5 days/week) to address physical and functional deficits in the context of the above medical diagnosis(es)? Yes Addressing deficits in the following areas: balance, endurance, locomotion, strength, transferring, bowel/bladder control, bathing, dressing, feeding, grooming and rom 5. Can the patient actively participate in an intensive therapy program of at least 3 hrs of therapy per day at least 5 days per week? Yes 6. The potential for patient to make measurable gains while on inpatient rehab is excellent 7. Anticipated functional outcomes upon discharge from inpatient rehab are mod I with PT, mod I with OT, n/a with SLP. 8. Estimated rehab length of stay to reach the above functional goals is: 7 days 9. Does the patient have adequate social supports to accommodate these discharge functional goals? Yes 10. Anticipated D/C setting: Home 11. Anticipated post D/C treatments: HH therapy 12. Overall Rehab/Functional Prognosis: excellent  RECOMMENDATIONS: This patient's condition is appropriate for continued rehabilitative care in the following setting: [facility] Patient has agreed to participate in recommended program. Yes Note that insurance prior authorization may be required for reimbursement for recommended care.  Comment:Rehab RN to follow  up.   Ivory Broad, MD     12/27/2012

## 2012-12-27 NOTE — Progress Notes (Signed)
100mg  lidocaine with epi 1:200K given via epidural

## 2012-12-27 NOTE — Plan of Care (Signed)
Problem: Consults Goal: Diagnosis- Total Joint Replacement Primary Total Knee     

## 2012-12-28 ENCOUNTER — Encounter (HOSPITAL_COMMUNITY): Payer: Self-pay | Admitting: Orthopedic Surgery

## 2012-12-28 DIAGNOSIS — Z96659 Presence of unspecified artificial knee joint: Secondary | ICD-10-CM

## 2012-12-28 DIAGNOSIS — M1710 Unilateral primary osteoarthritis, unspecified knee: Secondary | ICD-10-CM

## 2012-12-28 LAB — PROTIME-INR
INR: 1.1 (ref 0.00–1.49)
Prothrombin Time: 14.1 seconds (ref 11.6–15.2)

## 2012-12-28 LAB — CBC
HCT: 34.5 % — ABNORMAL LOW (ref 39.0–52.0)
Hemoglobin: 11.7 g/dL — ABNORMAL LOW (ref 13.0–17.0)
MCH: 29.2 pg (ref 26.0–34.0)
MCHC: 33.9 g/dL (ref 30.0–36.0)
MCV: 86 fL (ref 78.0–100.0)
Platelets: 162 10*3/uL (ref 150–400)
RBC: 4.01 MIL/uL — ABNORMAL LOW (ref 4.22–5.81)
RDW: 12.7 % (ref 11.5–15.5)
WBC: 7.7 10*3/uL (ref 4.0–10.5)

## 2012-12-28 LAB — BASIC METABOLIC PANEL
BUN: 10 mg/dL (ref 6–23)
CO2: 28 mEq/L (ref 19–32)
Calcium: 8.1 mg/dL — ABNORMAL LOW (ref 8.4–10.5)
Chloride: 102 mEq/L (ref 96–112)
Creatinine, Ser: 0.94 mg/dL (ref 0.50–1.35)
GFR calc Af Amer: >90 mL/min (ref >90)
GFR calc non Af Amer: 90 mL/min — ABNORMAL LOW (ref >90)
Glucose, Bld: 170 mg/dL — ABNORMAL HIGH (ref 70–99)
Potassium: 4 mEq/L (ref 3.5–5.1)
Sodium: 136 mEq/L (ref 135–145)

## 2012-12-28 MED ORDER — PATIENT'S GUIDE TO USING COUMADIN BOOK
Freq: Once | Status: AC
  Administered 2012-12-28: 12:00:00
  Filled 2012-12-28: qty 1

## 2012-12-28 MED ORDER — WARFARIN VIDEO
Freq: Once | Status: AC
  Administered 2012-12-28: 12:00:00

## 2012-12-28 MED ORDER — SODIUM CHLORIDE 0.9 % IV SOLN
INTRAVENOUS | Status: DC
  Administered 2012-12-28 – 2012-12-29 (×2): via EPIDURAL
  Filled 2012-12-28 (×8): qty 20

## 2012-12-28 MED ORDER — WARFARIN SODIUM 3 MG PO TABS
3.0000 mg | ORAL_TABLET | Freq: Once | ORAL | Status: AC
  Administered 2012-12-28: 3 mg via ORAL
  Filled 2012-12-28: qty 1

## 2012-12-28 MED ORDER — WARFARIN - PHARMACIST DOSING INPATIENT: Freq: Every day | Status: DC

## 2012-12-28 MED ORDER — ZOLPIDEM TARTRATE 5 MG PO TABS
5.0000 mg | ORAL_TABLET | Freq: Every evening | ORAL | Status: DC | PRN
  Administered 2012-12-28 – 2012-12-30 (×2): 5 mg via ORAL
  Filled 2012-12-28 (×2): qty 1

## 2012-12-28 NOTE — Progress Notes (Signed)
Utilization review completed.  

## 2012-12-28 NOTE — Progress Notes (Signed)
CSW is available to assist with d/c planning to SNF if [facility] is unable to accept pt. Pt is willing to accept [facility], as an option, if necessary.[facility] is pt's first choice for rehab.  Cori Razor LCSW [phone]

## 2012-12-28 NOTE — Evaluation (Signed)
Occupational Therapy Evaluation Patient Details Name: [name] MRN: [id number] DOB: 03-20-1951 Today's Date: 12/28/2012 Time: 4098-1191 OT Time Calculation (min): 25 min  OT Assessment / Plan / Recommendation Clinical Impression  Pt doing well POD 1 BTKR. Feel pt is appropriate for CIR. Skilled OT indicated to maximize independence with BADLs to min A level in prep for d/c to next venue.    OT Assessment  Patient needs continued OT Services    Follow Up Recommendations  [facility]    Barriers to Discharge      Equipment Recommendations  3 in 1 bedside comode    Recommendations for Other Services    Frequency  Min 2X/week    Precautions / Restrictions Precautions Precautions: Knee Required Braces or Orthoses: Knee Immobilizer - Right;Knee Immobilizer - Left Knee Immobilizer - Right: Discontinue once straight leg raise with < 10 degree lag Knee Immobilizer - Left: Discontinue once straight leg raise with < 10 degree lag Restrictions Weight Bearing Restrictions: No   Pertinent Vitals/Pain Pt reported 3/10 pain in Bknees. Repositioned and cold applied.    ADL  Grooming: Set up Where Assessed - Grooming: Unsupported sitting Upper Body Bathing: Set up Where Assessed - Upper Body Bathing: Unsupported sitting Lower Body Bathing: +1 Total assistance Where Assessed - Lower Body Bathing: Supported sit to stand Upper Body Dressing: Set up Where Assessed - Upper Body Dressing: Unsupported sitting Lower Body Dressing: +1 Total assistance Where Assessed - Lower Body Dressing: Supported sit to Pharmacist, hospital: +2 Total assistance Toilet Transfer: Patient Percentage: 60% Toilet Transfer Method: Sit to Barista: Other (comment) (recliner) Toileting - Clothing Manipulation and Hygiene: +1 Total assistance Where Assessed - Toileting Clothing Manipulation and Hygiene: Sit to stand from 3-in-1 or toilet Equipment Used: Rolling walker;Gait belt ADL Comments:  Pt mobilizing well. Min cues for ambulation for correct and safe technique.    OT Diagnosis: Generalized weakness  OT Problem List: Decreased activity tolerance;Decreased knowledge of use of DME or AE OT Treatment Interventions: Self-care/ADL training;Therapeutic activities;DME and/or AE instruction;Patient/family education   OT Goals Acute Rehab OT Goals OT Goal Formulation: With patient/family Time For Goal Achievement: 01/04/13 Potential to Achieve Goals: Good ADL Goals Pt Will Perform Grooming: Other (comment);Standing at sink (with minguard A) ADL Goal: Grooming - Progress: Goal set today Pt Will Perform Lower Body Bathing: with min assist;Sit to stand from chair;Sit to stand from bed ADL Goal: Lower Body Bathing - Progress: Goal set today Pt Will Perform Lower Body Dressing: with min assist;Sit to stand from bed;Sit to stand from chair ADL Goal: Lower Body Dressing - Progress: Goal set today Pt Will Transfer to Toilet: with min assist;with DME;Ambulation ADL Goal: Toilet Transfer - Progress: Goal set today Pt Will Perform Toileting - Clothing Manipulation: with min assist;Sitting on 3-in-1 or toilet;Standing ADL Goal: Toileting - Clothing Manipulation - Progress: Goal set today Pt Will Perform Toileting - Hygiene: with min assist;Sit to stand from 3-in-1/toilet ADL Goal: Toileting - Hygiene - Progress: Goal set today  Visit Information  Last OT Received On: 12/28/12 Assistance Needed: +2 PT/OT Co-Evaluation/Treatment: Yes    Subjective Data  Subjective: It's not feeling too bad.. Patient Stated Goal: go to [facility] for rehab.   Prior Functioning     Home Living Lives With: Spouse Available Help at Discharge: Family Type of Home: House Prior Function Level of Independence: Independent Able to Take Stairs?: Yes Driving: Yes Vocation: Part time employment Comments: Pt plans rehab following d/c. Communication Communication: No  difficulties Dominant Hand: Right          Vision/Perception     Cognition  Overall Cognitive Status: Appears within functional limits for tasks assessed/performed Arousal/Alertness: Awake/alert Orientation Level: Appears intact for tasks assessed Behavior During Session: [facility] for tasks performed    Extremity/Trunk Assessment Right Upper Extremity Assessment RUE ROM/Strength/Tone: [facility] for tasks assessed Left Upper Extremity Assessment LUE ROM/Strength/Tone: WFL for tasks assessed     Mobility Bed Mobility Bed Mobility: Supine to Sit Supine to Sit: 3: Mod assist;With rails;HOB elevated Details for Bed Mobility Assistance: A needed to mobilize BLEs off bed and min cues for hand placement, sequencing and technique. Transfers Transfers: Sit to Stand;Stand to Sit Sit to Stand: 1: +2 Total assist;From elevated surface;With upper extremity assist;From bed Sit to Stand: Patient Percentage: 60% Stand to Sit: 1: +2 Total assist;With upper extremity assist;With armrests;To chair/3-in-1 Stand to Sit: Patient Percentage: 60% Details for Transfer Assistance: physical A needed to rise, steady and control descent. Min cues for hand placement and BLE management.     Shoulder Instructions     Exercise     Balance     End of Session OT - End of Session Equipment Utilized During Treatment: Gait belt Activity Tolerance: Patient tolerated treatment well Patient left: in chair;with call bell/phone within reach;with family/visitor present  GO     PONTONERO,LAURIE A OTR/L [phone] 12/28/2012, 12:12 PM

## 2012-12-28 NOTE — Progress Notes (Signed)
Dr Riley Kill evaluated pt today and  reports pt meets criteria and would benefit from CIR.  I have begun the process for insurance authorization and will follow-up with pt on Thursday.  Talked with patient's wife by phone and informed her of above. She reports understanding and that the pt is eager to come to CIR.  Please call for questions: [phone]

## 2012-12-28 NOTE — Plan of Care (Signed)
Problem: Consults Goal: Diagnosis- Total Joint Replacement Primary Total Knee     

## 2012-12-28 NOTE — Progress Notes (Signed)
ANTICOAGULATION CONSULT NOTE - Initial Consult  Pharmacy Consult for Coumadin Indication: VTE prophylaxis s/p bilateral TKAs.  No Known Allergies  Patient Measurements: Height: 6' (182.9 cm) Weight: 274 lb (124.286 kg) IBW/kg (Calculated) : 77.6   Vital Signs: Temp: 98.7 F (37.1 C) (12/31 0627) BP: 162/78 mmHg (12/31 0627) Pulse Rate: 75  (12/31 0627)  Labs:  Basename 12/28/12 0425  HGB 11.7*  HCT 34.5*  PLT 162  APTT --  LABPROT 14.1  INR 1.10  HEPARINUNFRC --  CREATININE 0.94  CKTOTAL --  CKMB --  TROPONINI --    Estimated Creatinine Clearance: 115.3 ml/min (by C-G formula based on Cr of 0.94).  Medical History: Past Medical History  Diagnosis Date  . COLONIC POLYPS, HX OF 2007  . Arthritis     Knees  . OBESITY   . HYPOTHYROIDISM   . HYPERTENSION   . BPH (benign prostatic hypertrophy)    Medications:  Scheduled:    . [COMPLETED] acetaminophen  1,000 mg Intravenous Once  . acetaminophen  1,000 mg Intravenous Q6H  . alfuzosin  10 mg Oral QHS  . amLODipine  5 mg Oral QAC breakfast  . [COMPLETED]  ceFAZolin (ANCEF) IV  3 g Intravenous 60 min Pre-Op  . [COMPLETED]  ceFAZolin (ANCEF) IV  2 g Intravenous Q6H  . dexamethasone  10 mg Oral Once   Or  . dexamethasone  10 mg Intravenous Once  . docusate sodium  100 mg Oral BID  . levothyroxine  125 mcg Oral QAC breakfast  . scopolamine  1 patch Transdermal Once  . [DISCONTINUED] sodium chloride  3 mL Intravenous Q8H   Infusions:    . sodium chloride 100 mL/hr at 12/28/12 0010  . bupivacaine 1/10 % 12 mL/hr (12/28/12 0806)  . naLOXone [facility]) adult infusion for PRURITIS    . [DISCONTINUED] sodium chloride    . [DISCONTINUED] bupivacaine 1/10 % 12 mL/hr (12/27/12 1409)  . [DISCONTINUED] bupivacaine ON-Q pain pump      Assessment:  61yo M underwent bilateral TKAs on 12/30.   Coumadin to start today, POD#1 d/t indwelling epidural.  Goal of Therapy:  INR 2-3 Monitor platelets by anticoagulation  protocol: Yes   Plan:   Start with Coumadin 3mg  at 1800.  Check PT/INR daily.  F/u removal time of epidural.  Provide Coumadin education.  Charolotte Eke, PharmD, pager [phone]. 12/28/2012,8:24 AM.

## 2012-12-28 NOTE — Care Management Note (Unsigned)
    Page 1 of 1   12/28/2012     5:21:09 PM   CARE MANAGEMENT NOTE 12/28/2012  Patient:  [name], [name]   Account Number:  [id number]  Date Initiated:  12/28/2012  Documentation initiated by:  Colleen Can  Subjective/Objective Assessment:   dx osteoarthritis bilateral knees; bilateral knee replacemnt     Action/Plan:   CM spoke with patient.  Plans are for inpatient rehab at [facility] for 1st choice; 2nd choice SNF rehab   Anticipated DC Date:  12/31/2012   Anticipated DC Plan:  IP REHAB FACILITY  In-house referral  Clinical Social Worker      DC Planning Services  CM consult      PAC Choice  IP REHAB   Choice offered to / List presented to:  C-1 Patient           Status of service:  In process, will continue to follow Medicare Important Message given?   (If response is "NO", the following Medicare IM given date fields will be blank) Date Medicare IM given:   Date Additional Medicare IM given:    Discharge Disposition:    Per UR Regulation:    If discussed at Long Length of Stay Meetings, dates discussed:    Comments:

## 2012-12-28 NOTE — Progress Notes (Signed)
   Subjective: 1 Day Post-Op Procedure(s) (LRB): TOTAL KNEE BILATERAL (Bilateral) Patient reports pain as mild.   Patient seen in rounds with Dr. Lequita Halt. Patient is well, and has had no acute complaints or problems and has fairly good pain control at time of rounds. We will start therapy today.  Plan is to go [facility] after hospital stay.  Objective: Vital signs in last 24 hours: Temp:  [97.4 F (36.3 C)-98.8 F (37.1 C)] 98.7 F (37.1 C) (12/31 0627) Pulse Rate:  [55-83] 75  (12/31 0627) Resp:  [10-23] 18  (12/31 0806) BP: (104-162)/(61-83) 162/78 mmHg (12/31 0627) SpO2:  [92 %-100 %] 98 % (12/31 0806) Weight:  [124.286 kg (274 lb)] 124.286 kg (274 lb) (12/30 1520)  Intake/Output from previous day:  Intake/Output Summary (Last 24 hours) at 12/28/12 0943 Last data filed at 12/28/12 0811  Gross per 24 hour  Intake 7139.34 ml  Output   2695 ml  Net 4444.34 ml    Intake/Output this shift: Total I/O In: 240 [P.O.:240] Out: 100 [Urine:100]  Labs:  [facility] 12/28/12 0425  HGB 11.7*    Basename 12/28/12 0425  WBC 7.7  RBC 4.01*  HCT 34.5*  PLT 162    Basename 12/28/12 0425  NA 136  K 4.0  CL 102  CO2 28  BUN 10  CREATININE 0.94  GLUCOSE 170*  CALCIUM 8.1*    Basename 12/28/12 0425  LABPT --  INR 1.10    EXAM General - Patient is Alert, Appropriate and Oriented Extremity - Neurovascular intact Sensation intact distally Dorsiflexion/Plantar flexion intact Dressing - dressing C/D/I to both knees Motor Function - intact, moving feet and toes well on exam.  Both Hemovacs pulled without difficulty.  Past Medical History  Diagnosis Date  . COLONIC POLYPS, HX OF 2007  . Arthritis     Knees  . OBESITY   . HYPOTHYROIDISM   . HYPERTENSION   . BPH (benign prostatic hypertrophy)     Assessment/Plan: 1 Day Post-Op Procedure(s) (LRB): TOTAL KNEE BILATERAL (Bilateral) Principal Problem:  *OA (osteoarthritis) of knee  Estimated Body mass  index is 37.16 kg/(m^2) as calculated from the following:   Height as of this encounter: 6\' 0" (1.829 m).   Weight as of this encounter: 274 lb(124.286 kg). Advance diet Up with therapy Continue foley due to having epidural in place and also strict I&O's postop  DVT Prophylaxis - Lovenox and Coumadin, Lovenox will not start until tomorrow afternoon following removal of the epidural. First dose of Coumadin this evening.  Aspirin 81 mg on hold for now. Weight-Bearing as tolerated to both leg  No vaccines.  Continue O2 and Pulse OX and try on Room Air CIR Consult for possible admission  [name] 12/28/2012, 9:43 AM

## 2012-12-28 NOTE — Evaluation (Signed)
Physical Therapy Evaluation Patient Details Name: [name] MRN: [id number] DOB: Apr 16, 1951 Today's Date: 12/28/2012 Time: 8295-6213 PT Time Calculation (min): 42 min  PT Assessment / Plan / Recommendation Clinical Impression  Pt s/p Bil TKR presents with decreased BIL LE strength/ROM and post op pain limiting functional mobility..  Pt is very motivated and would benefit greatly from intensive inpt rehab.    PT Assessment  Patient needs continued PT services    Follow Up Recommendations  CIR    Does the patient have the potential to tolerate intense rehabilitation      Barriers to Discharge        Equipment Recommendations  None recommended by PT    Recommendations for Other Services OT consult   Frequency 7X/week    Precautions / Restrictions Precautions Precautions: Knee Required Braces or Orthoses: Knee Immobilizer - Right;Knee Immobilizer - Left Knee Immobilizer - Right: Discontinue once straight leg raise with < 10 degree lag Knee Immobilizer - Left: Discontinue once straight leg raise with < 10 degree lag Restrictions Weight Bearing Restrictions: No Other Position/Activity Restrictions: WBAT   Pertinent Vitals/Pain 4-5/10; premedicated, cold packs provided, epidural in place      Mobility  Bed Mobility Bed Mobility: Supine to Sit Supine to Sit: 3: Mod assist;With rails;HOB elevated Supine to Sit: Patient Percentage: 60% Details for Bed Mobility Assistance: A needed to mobilize BLEs off bed and min cues for hand placement, sequencing and technique. Transfers Transfers: Sit to Stand;Stand to Sit Sit to Stand: 1: +2 Total assist;From elevated surface;With upper extremity assist;From bed Sit to Stand: Patient Percentage: 60% Stand to Sit: 1: +2 Total assist;With upper extremity assist;With armrests;To chair/3-in-1 Stand to Sit: Patient Percentage: 60% Details for Transfer Assistance: physical A needed to rise, steady and control descent. Min cues for hand  placement and BLE management. Ambulation/Gait Ambulation/Gait Assistance: 1: +2 Total assist Ambulation/Gait: Patient Percentage: 60% Ambulation Distance (Feet): 17 Feet Assistive device: Rolling walker Ambulation/Gait Assistance Details: cues for posture, position from RW and sequence Gait Pattern: Step-to pattern;Decreased step length - right;Decreased step length - left    Shoulder Instructions     Exercises Total Joint Exercises Ankle Circles/Pumps: AROM;10 reps;Supine;Both Quad Sets: AROM;10 reps;Supine;Both Heel Slides: AAROM;10 reps;Supine;Both Straight Leg Raises: AAROM;10 reps;Both;Supine   PT Diagnosis: Difficulty walking  PT Problem List: Decreased strength;Decreased range of motion;Decreased activity tolerance;Decreased mobility;Decreased knowledge of use of DME;Pain;Decreased knowledge of precautions PT Treatment Interventions: DME instruction;Gait training;Stair training;Functional mobility training;Therapeutic activities;Therapeutic exercise;Patient/family education   PT Goals Acute Rehab PT Goals PT Goal Formulation: With patient Time For Goal Achievement: 01/04/13 Potential to Achieve Goals: Good Pt will go Supine/Side to Sit: with min assist PT Goal: Supine/Side to Sit - Progress: Goal set today Pt will go Sit to Supine/Side: with min assist PT Goal: Sit to Supine/Side - Progress: Goal set today Pt will go Sit to Stand: with min assist PT Goal: Sit to Stand - Progress: Goal set today Pt will go Stand to Sit: with min assist PT Goal: Stand to Sit - Progress: Goal set today Pt will Ambulate: 51 - 150 feet;with min assist PT Goal: Ambulate - Progress: Goal set today  Visit Information  Last PT Received On: 12/28/12 Assistance Needed: +2    Subjective Data  Subjective: I was doing everything I needed to but my knees just hurt Patient Stated Goal: Resume active lifestyle with decreased pain   Prior Functioning  Home Living Lives With: Spouse Available Help  at Discharge: Family Type of  Home: House Prior Function Level of Independence: Independent Able to Take Stairs?: Yes Driving: Yes Vocation: Part time employment Comments: Pt plans rehab following d/c. Communication Communication: No difficulties Dominant Hand: Right    Cognition  Overall Cognitive Status: Appears within functional limits for tasks assessed/performed Arousal/Alertness: Awake/alert Orientation Level: Appears intact for tasks assessed Behavior During Session: [facility] for tasks performed    Extremity/Trunk Assessment Right Upper Extremity Assessment RUE ROM/Strength/Tone: [facility] for tasks assessed Left Upper Extremity Assessment LUE ROM/Strength/Tone: WFL for tasks assessed Right Lower Extremity Assessment RLE ROM/Strength/Tone: Deficits RLE ROM/Strength/Tone Deficits: Quads 2+/5 with AAROM at inee -10 - 40 Left Lower Extremity Assessment LLE ROM/Strength/Tone: Deficits LLE ROM/Strength/Tone Deficits: Quads 2/5 with AAROM at knee -10 - 35   Balance    End of Session PT - End of Session Equipment Utilized During Treatment: Gait belt;Right knee immobilizer;Left knee immobilizer Activity Tolerance: Patient tolerated treatment well Patient left: in chair;with call bell/phone within reach;with family/visitor present Nurse Communication: Mobility status  GP     [name],[name] 12/28/2012, 12:17 PM

## 2012-12-28 NOTE — Progress Notes (Signed)
Rehab Admissions Coordinator Note:  Patient was screened by Clois Dupes for appropriateness for an Inpatient Acute Rehab Consult.  Rehab consult pending today. Lutricia Horsfall to follow up. [phone]  Clois Dupes, RN 12/28/2012, 12:23 PM  I can be reached at [phone].

## 2012-12-28 NOTE — Progress Notes (Signed)
Physical Therapy Treatment Patient Details Name: [name] MRN: [id number] DOB: 1951-09-05 Today's Date: 12/28/2012 Time: 3086-5784 PT Time Calculation (min): 26 min  PT Assessment / Plan / Recommendation Comments on Treatment Session       Follow Up Recommendations  CIR     Does the patient have the potential to tolerate intense rehabilitation     Barriers to Discharge        Equipment Recommendations  None recommended by PT    Recommendations for Other Services OT consult  Frequency 7X/week   Plan Discharge plan remains appropriate    Precautions / Restrictions Precautions Precautions: Knee Required Braces or Orthoses: Knee Immobilizer - Right;Knee Immobilizer - Left Knee Immobilizer - Right: Discontinue once straight leg raise with < 10 degree lag Knee Immobilizer - Left: Discontinue once straight leg raise with < 10 degree lag Restrictions Weight Bearing Restrictions: No Other Position/Activity Restrictions: WBAT   Pertinent Vitals/Pain 4/10; premedicated    Mobility  Bed Mobility Bed Mobility: Sit to Supine Supine to Sit: 3: Mod assist;With rails;HOB elevated Sit to Supine: 3: Mod assist Details for Bed Mobility Assistance: A needed to mobilize BLEs off bed and min cues for hand placement, sequencing and technique. Transfers Transfers: Sit to Stand;Stand to Sit Sit to Stand: 1: +2 Total assist;With upper extremity assist;From chair/3-in-1;With armrests Sit to Stand: Patient Percentage: 60% Stand to Sit: 1: +2 Total assist;With upper extremity assist;To bed Stand to Sit: Patient Percentage: 60% Details for Transfer Assistance: physical A needed to rise, steady and control descent. Min cues for hand placement and BLE management. Ambulation/Gait Ambulation/Gait Assistance: 1: +2 Total assist Ambulation/Gait: Patient Percentage: 60% Ambulation Distance (Feet): 38 Feet Assistive device: Rolling walker Ambulation/Gait Assistance Details: cues for posture,  sequence, and position from RW Gait Pattern: Step-to pattern;Decreased step length - right;Decreased step length - left    Exercises     PT Diagnosis:    PT Problem List:   PT Treatment Interventions:     PT Goals Acute Rehab PT Goals PT Goal Formulation: With patient Time For Goal Achievement: 01/04/13 Potential to Achieve Goals: Good Pt will go Supine/Side to Sit: with min assist PT Goal: Supine/Side to Sit - Progress: Goal set today Pt will go Sit to Supine/Side: with min assist PT Goal: Sit to Supine/Side - Progress: Goal set today Pt will go Sit to Stand: with min assist PT Goal: Sit to Stand - Progress: Goal set today Pt will go Stand to Sit: with min assist PT Goal: Stand to Sit - Progress: Goal set today Pt will Ambulate: 51 - 150 feet;with min assist PT Goal: Ambulate - Progress: Goal set today  Visit Information  Last PT Received On: 12/28/12 Assistance Needed: +2    Subjective Data  Subjective: I think I'm doing pretty good Patient Stated Goal: Resume active lifestyle with decreased pain   Cognition  Overall Cognitive Status: Appears within functional limits for tasks assessed/performed Arousal/Alertness: Awake/alert Orientation Level: Appears intact for tasks assessed Behavior During Session: [facility] for tasks performed    Balance     End of Session     GP     [name] 12/28/2012, 3:18 PM

## 2012-12-29 LAB — BASIC METABOLIC PANEL
BUN: 8 mg/dL (ref 6–23)
CO2: 28 mEq/L (ref 19–32)
Calcium: 8.4 mg/dL (ref 8.4–10.5)
Chloride: 99 mEq/L (ref 96–112)
Creatinine, Ser: 0.75 mg/dL (ref 0.50–1.35)
GFR calc Af Amer: >90 mL/min (ref >90)
GFR calc non Af Amer: >90 mL/min (ref >90)
Glucose, Bld: 134 mg/dL — ABNORMAL HIGH (ref 70–99)
Potassium: 3.8 mEq/L (ref 3.5–5.1)
Sodium: 132 mEq/L — ABNORMAL LOW (ref 135–145)

## 2012-12-29 LAB — CBC
HCT: 29.5 % — ABNORMAL LOW (ref 39.0–52.0)
Hemoglobin: 10.2 g/dL — ABNORMAL LOW (ref 13.0–17.0)
MCH: 29.8 pg (ref 26.0–34.0)
MCHC: 34.6 g/dL (ref 30.0–36.0)
MCV: 86.3 fL (ref 78.0–100.0)
Platelets: 146 10*3/uL — ABNORMAL LOW (ref 150–400)
RBC: 3.42 MIL/uL — ABNORMAL LOW (ref 4.22–5.81)
RDW: 12.8 % (ref 11.5–15.5)
WBC: 7.9 10*3/uL (ref 4.0–10.5)

## 2012-12-29 LAB — PROTIME-INR
INR: 1.1 (ref 0.00–1.49)
Prothrombin Time: 14.1 seconds (ref 11.6–15.2)

## 2012-12-29 MED ORDER — ENOXAPARIN SODIUM 30 MG/0.3ML ~~LOC~~ SOLN
30.0000 mg | Freq: Two times a day (BID) | SUBCUTANEOUS | Status: DC
  Filled 2012-12-29: qty 0.3

## 2012-12-29 MED ORDER — ENOXAPARIN SODIUM 30 MG/0.3ML ~~LOC~~ SOLN
30.0000 mg | Freq: Once | SUBCUTANEOUS | Status: AC
  Administered 2012-12-30: 30 mg via SUBCUTANEOUS
  Filled 2012-12-29: qty 0.3

## 2012-12-29 MED ORDER — ENOXAPARIN SODIUM 30 MG/0.3ML ~~LOC~~ SOLN
30.0000 mg | SUBCUTANEOUS | Status: DC
  Administered 2012-12-30: 30 mg via SUBCUTANEOUS
  Filled 2012-12-29 (×2): qty 0.3

## 2012-12-29 MED ORDER — WARFARIN SODIUM 5 MG PO TABS
5.0000 mg | ORAL_TABLET | Freq: Once | ORAL | Status: AC
  Administered 2012-12-29: 5 mg via ORAL
  Filled 2012-12-29: qty 1

## 2012-12-29 NOTE — Progress Notes (Signed)
   Subjective: 2 Days Post-Op Procedure(s) (LRB): TOTAL KNEE BILATERAL (Bilateral) Patient reports pain as mild.   Patient seen in rounds with Dr. Lequita Halt. Patient is well, and has had no acute complaints or problems Epidural to come out today.  Anticipate possible increase in pain temporarily after the epidural has been removed. Plan is to go Rehab after hospital stay.  Objective: Vital signs in last 24 hours: Temp:  [98.2 F (36.8 C)-99.7 F (37.6 C)] 98.2 F (36.8 C) (01/01 0622) Pulse Rate:  [72-91] 91  (01/01 0622) Resp:  [18-27] 23  (01/01 0622) BP: (113-159)/(70-81) 145/79 mmHg (01/01 0622) SpO2:  [92 %-98 %] 93 % (01/01 0622)  Intake/Output from previous day:  Intake/Output Summary (Last 24 hours) at 12/29/12 0808 Last data filed at 12/29/12 0659  Gross per 24 hour  Intake 2434.5 ml  Output   1350 ml  Net 1084.5 ml    Labs:  Basename 12/29/12 0442 12/28/12 0425  HGB 10.2* 11.7*    Basename 12/29/12 0442 12/28/12 0425  WBC 7.9 7.7  RBC 3.42* 4.01*  HCT 29.5* 34.5*  PLT 146* 162    Basename 12/29/12 0442 12/28/12 0425  NA 132* 136  K 3.8 4.0  CL 99 102  BUN 8 10  CREATININE 0.75 0.94  GLUCOSE 134* 170*  CALCIUM 8.4 8.1*    Basename 12/29/12 0442 12/28/12 0425  LABPT -- --  INR 1.10 1.10    EXAM General - Patient is Alert, Appropriate and Oriented Extremity - Neurovascular intact Sensation intact distally Dorsiflexion/Plantar flexion intact No cellulitis present Dressing/Incision - clean, dry, no drainage, healing to both knees Motor Function - intact, moving feet and toes well on exam.    Past Medical History  Diagnosis Date  . COLONIC POLYPS, HX OF 2007  . Arthritis     Knees  . OBESITY   . HYPOTHYROIDISM   . HYPERTENSION   . BPH (benign prostatic hypertrophy)     Assessment/Plan: 2 Days Post-Op Procedure(s) (LRB): TOTAL KNEE BILATERAL (Bilateral) Principal Problem:  *OA (osteoarthritis) of knee  Estimated Body mass index is  37.16 kg/(m^2) as calculated from the following:   Height as of this encounter: 6\' 0" (1.829 m).   Weight as of this encounter: 274 lb(124.286 kg). Up with therapy Continue foley due to urinary output monitoring and he still has his epidural in place.  Will remove the catheter six hours after the epidural is removed.  Will need to note in chart when the epidural is pulled.  DVT Prophylaxis - Lovenox and Coumadin, Lovenox will not start until later this afternoon though after the epidural has been removed for twelve hours.  Will need to note in chart when the epidural is pulled. Anticipate possible increase in pain temporarily after the epidural has been removed.  Weight-Bearing as tolerated to both legs

## 2012-12-29 NOTE — Progress Notes (Signed)
Brief Pharmacy note: Lovenox  Lovenox to begin 12 hr after epidural catheter removed: Done 1/1 at 1300  Lovenox 30mg  x 1 on 1/2 at 0100, then to continue q12 hr from 1800.  Thank you,  Otho Bellows PharmD Pager [phone] 12/29/2012 1:47 PM

## 2012-12-29 NOTE — Progress Notes (Addendum)
ANTICOAGULATION CONSULT NOTE - Follow Up Consult  Pharmacy Consult for Warfarin Indication: VTE prophylaxis s/p bilateral TKA  No Known Allergies  Patient Measurements: Height: 6' (182.9 cm) Weight: 274 lb (124.286 kg) IBW/kg (Calculated) : 77.6   Vital Signs: Temp: 98.2 F (36.8 C) (01/01 0622) BP: 145/79 mmHg (01/01 0622) Pulse Rate: 91  (01/01 0622)  Labs:  Basename 12/29/12 0442 12/28/12 0425  HGB 10.2* 11.7*  HCT 29.5* 34.5*  PLT 146* 162  APTT -- --  LABPROT 14.1 14.1  INR 1.10 1.10  HEPARINUNFRC -- --  CREATININE 0.75 0.94  CKTOTAL -- --  CKMB -- --  TROPONINI -- --   Estimated Creatinine Clearance: 135.4 ml/min (by C-G formula based on Cr of 0.75).  Medications:  Scheduled:    . [COMPLETED] acetaminophen  1,000 mg Intravenous Q6H  . alfuzosin  10 mg Oral QHS  . amLODipine  5 mg Oral QAC breakfast  . dexamethasone  10 mg Oral Once   Or  . dexamethasone  10 mg Intravenous Once  . docusate sodium  100 mg Oral BID  . levothyroxine  125 mcg Oral QAC breakfast  . [COMPLETED] patient's guide to using coumadin book   Does not apply Once  . scopolamine  1 patch Transdermal Once  . warfarin  3 mg Oral ONCE-1800  . [COMPLETED] warfarin   Does not apply Once  . Warfarin - Pharmacist Dosing Inpatient   Does not apply q1800    Assessment: 59 yoM s/p bilateral TKA 12/30. Epidural bupivacaine continues, plan Lovenox when epidural catheter removed  Coumadin NOT charted last night (3mg  dose)  INR 1.1 today, unchanged  Lovenox 30mg  SQ q12 to begin tonight; ;12 hr after epidural catheter out, will adjust Lovenox accordingly thank you  Goal of Therapy:  INR 2-3   Plan:   Lovenox 30mg  SQ q12 to begin 12 hr after epidural cath removed  Epidural catheter removed at 1300 on 1/1  Since no Coumadin last night, will give 5mg  Coumadin today at 1800  Otho Bellows PharmD Pager [phone] 12/29/2012,7:55 AM

## 2012-12-29 NOTE — Progress Notes (Signed)
Physical Therapy Treatment Patient Details Name: LONDEN BOK MRN: [id number] DOB: November 26, 1951 Today's Date: 12/29/2012 Time: 0918-1006 PT Time Calculation (min): 48 min  PT Assessment / Plan / Recommendation Comments on Treatment Session       Follow Up Recommendations  CIR     Does the patient have the potential to tolerate intense rehabilitation     Barriers to Discharge        Equipment Recommendations  None recommended by PT    Recommendations for Other Services OT consult  Frequency 7X/week   Plan Discharge plan remains appropriate    Precautions / Restrictions Precautions Precautions: Knee Required Braces or Orthoses: Knee Immobilizer - Right;Knee Immobilizer - Left Knee Immobilizer - Right: Discontinue once straight leg raise with < 10 degree lag Knee Immobilizer - Left: Discontinue once straight leg raise with < 10 degree lag Restrictions Weight Bearing Restrictions: No Other Position/Activity Restrictions: WBAT   Pertinent Vitals/Pain 4/10; premedicated, epidural in place, cold packs provided    Mobility  Bed Mobility Bed Mobility: Supine to Sit Supine to Sit: 3: Mod assist Details for Bed Mobility Assistance: A needed to mobilize BLEs off bed and min cues for hand placement, sequencing and technique. Transfers Transfers: Sit to Stand;Stand to Sit Sit to Stand: 1: +2 Total assist;With upper extremity assist;From bed;From elevated surface Sit to Stand: Patient Percentage: 70% Stand to Sit: 1: +2 Total assist;With upper extremity assist;To chair/3-in-1;With armrests Stand to Sit: Patient Percentage: 70% Details for Transfer Assistance: physical A needed to rise, steady and control descent. Min cues for hand placement and BLE management. Ambulation/Gait Ambulation/Gait Assistance: 1: +2 Total assist Ambulation/Gait: Patient Percentage: 70% Ambulation Distance (Feet): 48 Feet Assistive device: Rolling walker Ambulation/Gait Assistance Details: min cues for  posture and position from RW Gait Pattern: Step-to pattern;Decreased step length - right;Decreased step length - left    Exercises Total Joint Exercises Ankle Circles/Pumps: AROM;Supine;Both;15 reps Quad Sets: AROM;Supine;Both;15 reps Heel Slides: AAROM;Supine;Both;20 reps Straight Leg Raises: AAROM;Both;Supine;20 reps   PT Diagnosis:    PT Problem List:   PT Treatment Interventions:     PT Goals Acute Rehab PT Goals PT Goal Formulation: With patient Time For Goal Achievement: 01/04/13 Potential to Achieve Goals: Good Pt will go Supine/Side to Sit: with min assist PT Goal: Supine/Side to Sit - Progress: Progressing toward goal Pt will go Sit to Supine/Side: with min assist PT Goal: Sit to Supine/Side - Progress: Progressing toward goal Pt will go Sit to Stand: with min assist PT Goal: Sit to Stand - Progress: Progressing toward goal Pt will go Stand to Sit: with min assist PT Goal: Stand to Sit - Progress: Progressing toward goal Pt will Ambulate: 51 - 150 feet;with min assist PT Goal: Ambulate - Progress: Progressing toward goal  Visit Information  Last PT Received On: 12/29/12 Assistance Needed: +2    Subjective Data  Subjective: I feel stronger than yesterday Patient Stated Goal: Resume active lifestyle with decreased pain   Cognition  Overall Cognitive Status: Appears within functional limits for tasks assessed/performed Arousal/Alertness: Awake/alert Orientation Level: Appears intact for tasks assessed Behavior During Session: [facility] for tasks performed    Balance     End of Session     GP     [name],[name] 12/29/2012, 10:54 AM

## 2012-12-29 NOTE — Progress Notes (Signed)
Physical Therapy Treatment Patient Details Name: [name] MRN: [id number] DOB: 1951-03-07 Today's Date: 12/29/2012 Time: 1355-1420 PT Time Calculation (min): 25 min  PT Assessment / Plan / Recommendation Comments on Treatment Session       Follow Up Recommendations  CIR     Does the patient have the potential to tolerate intense rehabilitation     Barriers to Discharge        Equipment Recommendations  None recommended by PT    Recommendations for Other Services OT consult  Frequency 7X/week   Plan Discharge plan remains appropriate    Precautions / Restrictions Precautions Precautions: Knee Required Braces or Orthoses: Knee Immobilizer - Right;Knee Immobilizer - Left Knee Immobilizer - Right: Discontinue once straight leg raise with < 10 degree lag Knee Immobilizer - Left: Discontinue once straight leg raise with < 10 degree lag Restrictions Weight Bearing Restrictions: No Other Position/Activity Restrictions: WBAT   Pertinent Vitals/Pain 4/10; premedicated    Mobility  Bed Mobility Bed Mobility: Sit to Supine Sit to Supine: 3: Mod assist Details for Bed Mobility Assistance: Assist needed Bil LEs onto bed Transfers Transfers: Sit to Stand;Stand to Sit Sit to Stand: 1: +2 Total assist;With upper extremity assist;With armrests;From chair/3-in-1 Sit to Stand: Patient Percentage: 60% Stand to Sit: 1: +2 Total assist;With upper extremity assist;To bed Stand to Sit: Patient Percentage: 70% Details for Transfer Assistance: physical A needed to rise, steady and control descent. Min cues for hand placement and BLE management. Ambulation/Gait Ambulation/Gait Assistance: 1: +2 Total assist Ambulation/Gait: Patient Percentage: 80% Ambulation Distance (Feet): 92 Feet Assistive device: Rolling walker Ambulation/Gait Assistance Details: min cues for posture and position from RW Gait Pattern: Step-to pattern;Decreased step length - right;Decreased step length - left      Exercises     PT Diagnosis:    PT Problem List:   PT Treatment Interventions:     PT Goals Acute Rehab PT Goals PT Goal Formulation: With patient Time For Goal Achievement: 01/04/13 Potential to Achieve Goals: Good Pt will go Supine/Side to Sit: with min assist PT Goal: Supine/Side to Sit - Progress: Progressing toward goal Pt will go Sit to Supine/Side: with min assist PT Goal: Sit to Supine/Side - Progress: Progressing toward goal Pt will go Sit to Stand: with min assist PT Goal: Sit to Stand - Progress: Progressing toward goal Pt will go Stand to Sit: with min assist PT Goal: Stand to Sit - Progress: Progressing toward goal Pt will Ambulate: 51 - 150 feet;with min assist PT Goal: Ambulate - Progress: Progressing toward goal  Visit Information  Last PT Received On: 12/29/12 Assistance Needed: +2    Subjective Data  Subjective: They pulled my epidural a little while ago Patient Stated Goal: Resume active lifestyle with decreased pain   Cognition  Overall Cognitive Status: Appears within functional limits for tasks assessed/performed Arousal/Alertness: Awake/alert Orientation Level: Appears intact for tasks assessed Behavior During Session: [facility] for tasks performed    Balance     End of Session     GP     [name],[name] 12/29/2012, 2:26 PM

## 2012-12-29 NOTE — Progress Notes (Signed)
Called to remove epidural. Pain control has been good. INR today is 1.10. Denies back pain. Has been up walking today.  Epidural removed and the tip was intact. Bandaid applied. Site nonred, and nontender.  Shirlee Limerick, RN was notified of epidural removal.

## 2012-12-29 NOTE — Addendum Note (Signed)
Addendum  created 12/29/12 1302 by Azell Der, MD   Modules edited:Inpatient Notes

## 2012-12-30 ENCOUNTER — Inpatient Hospital Stay (HOSPITAL_COMMUNITY)
Admission: AD | Admit: 2012-12-30 | Discharge: 2013-01-07 | DRG: 945 | Disposition: A | Discharge status: Home / Self Care | Payer: 59 | Source: Ambulatory Visit | Attending: Physical Medicine & Rehabilitation | Admitting: Physical Medicine & Rehabilitation

## 2012-12-30 DIAGNOSIS — D62 Acute posthemorrhagic anemia: Secondary | ICD-10-CM | POA: Diagnosis present

## 2012-12-30 DIAGNOSIS — Z96659 Presence of unspecified artificial knee joint: Secondary | ICD-10-CM

## 2012-12-30 DIAGNOSIS — N400 Enlarged prostate without lower urinary tract symptoms: Secondary | ICD-10-CM | POA: Diagnosis present

## 2012-12-30 DIAGNOSIS — R059 Cough, unspecified: Secondary | ICD-10-CM | POA: Diagnosis present

## 2012-12-30 DIAGNOSIS — M1710 Unilateral primary osteoarthritis, unspecified knee: Secondary | ICD-10-CM

## 2012-12-30 DIAGNOSIS — Z5189 Encounter for other specified aftercare: Principal | ICD-10-CM

## 2012-12-30 DIAGNOSIS — G4700 Insomnia, unspecified: Secondary | ICD-10-CM | POA: Diagnosis present

## 2012-12-30 DIAGNOSIS — Z860100 Personal history of colon polyps, unspecified: Secondary | ICD-10-CM

## 2012-12-30 DIAGNOSIS — E669 Obesity, unspecified: Secondary | ICD-10-CM | POA: Diagnosis present

## 2012-12-30 DIAGNOSIS — R05 Cough: Secondary | ICD-10-CM | POA: Diagnosis present

## 2012-12-30 DIAGNOSIS — K5900 Constipation, unspecified: Secondary | ICD-10-CM | POA: Diagnosis present

## 2012-12-30 DIAGNOSIS — E039 Hypothyroidism, unspecified: Secondary | ICD-10-CM | POA: Diagnosis present

## 2012-12-30 DIAGNOSIS — E871 Hypo-osmolality and hyponatremia: Secondary | ICD-10-CM | POA: Diagnosis present

## 2012-12-30 DIAGNOSIS — I10 Essential (primary) hypertension: Secondary | ICD-10-CM | POA: Diagnosis present

## 2012-12-30 LAB — CBC
HCT: 26.8 % — ABNORMAL LOW (ref 39.0–52.0)
Hemoglobin: 9.3 g/dL — ABNORMAL LOW (ref 13.0–17.0)
MCH: 29.5 pg (ref 26.0–34.0)
MCHC: 34.7 g/dL (ref 30.0–36.0)
MCV: 85.1 fL (ref 78.0–100.0)
Platelets: 147 10*3/uL — ABNORMAL LOW (ref 150–400)
RBC: 3.15 MIL/uL — ABNORMAL LOW (ref 4.22–5.81)
RDW: 12.9 % (ref 11.5–15.5)
WBC: 7.3 10*3/uL (ref 4.0–10.5)

## 2012-12-30 LAB — PROTIME-INR
INR: 1.23 (ref 0.00–1.49)
Prothrombin Time: 15.3 seconds — ABNORMAL HIGH (ref 11.6–15.2)

## 2012-12-30 MED ORDER — TRAMADOL HCL 50 MG PO TABS: 50.0000 mg | ORAL_TABLET | Freq: Four times a day (QID) | ORAL | Status: DC | PRN

## 2012-12-30 MED ORDER — LEVOTHYROXINE SODIUM 125 MCG PO TABS
125.0000 ug | ORAL_TABLET | Freq: Every day | ORAL | Status: DC
  Administered 2012-12-31 – 2013-01-07 (×8): 125 ug via ORAL
  Filled 2012-12-30 (×9): qty 1

## 2012-12-30 MED ORDER — WARFARIN SODIUM 5 MG PO TABS: 5.0000 mg | ORAL_TABLET | Freq: Once | ORAL | Status: DC

## 2012-12-30 MED ORDER — PROCHLORPERAZINE MALEATE 5 MG PO TABS
5.0000 mg | ORAL_TABLET | Freq: Four times a day (QID) | ORAL | Status: DC | PRN
  Filled 2012-12-30: qty 2

## 2012-12-30 MED ORDER — ENOXAPARIN SODIUM 30 MG/0.3ML ~~LOC~~ SOLN: 30.0000 mg | Freq: Two times a day (BID) | SUBCUTANEOUS | Status: DC

## 2012-12-30 MED ORDER — BISACODYL 10 MG RE SUPP
10.0000 mg | Freq: Every day | RECTAL | Status: DC | PRN
  Administered 2013-01-03: 10 mg via RECTAL
  Filled 2012-12-30 (×2): qty 1

## 2012-12-30 MED ORDER — DIPHENHYDRAMINE HCL 12.5 MG/5ML PO ELIX: 12.5000 mg | ORAL_SOLUTION | ORAL | Status: DC | PRN

## 2012-12-30 MED ORDER — ENOXAPARIN SODIUM 30 MG/0.3ML ~~LOC~~ SOLN
30.0000 mg | SUBCUTANEOUS | Status: DC
  Administered 2012-12-31 – 2013-01-05 (×11): 30 mg via SUBCUTANEOUS
  Filled 2012-12-30 (×13): qty 0.3

## 2012-12-30 MED ORDER — BISACODYL 10 MG RE SUPP: 10.0000 mg | Freq: Every day | RECTAL | Status: DC | PRN

## 2012-12-30 MED ORDER — PROCHLORPERAZINE EDISYLATE 5 MG/ML IJ SOLN
5.0000 mg | Freq: Four times a day (QID) | INTRAMUSCULAR | Status: DC | PRN
  Filled 2012-12-30: qty 2

## 2012-12-30 MED ORDER — ALFUZOSIN HCL ER 10 MG PO TB24
10.0000 mg | ORAL_TABLET | Freq: Every day | ORAL | Status: DC
  Administered 2012-12-31 – 2013-01-06 (×7): 10 mg via ORAL
  Filled 2012-12-30 (×8): qty 1

## 2012-12-30 MED ORDER — FLEET ENEMA 7-19 GM/118ML RE ENEM: 1.0000 | ENEMA | Freq: Once | RECTAL | Status: AC | PRN

## 2012-12-30 MED ORDER — PROCHLORPERAZINE 25 MG RE SUPP
12.5000 mg | Freq: Four times a day (QID) | RECTAL | Status: DC | PRN
  Filled 2012-12-30: qty 1

## 2012-12-30 MED ORDER — ZOLPIDEM TARTRATE 5 MG PO TABS: 5.0000 mg | ORAL_TABLET | Freq: Every evening | ORAL | Status: DC | PRN

## 2012-12-30 MED ORDER — OXYCODONE HCL 5 MG PO TABS
5.0000 mg | ORAL_TABLET | ORAL | Status: DC | PRN
  Administered 2012-12-31: 5 mg via ORAL
  Administered 2012-12-31 – 2013-01-07 (×25): 10 mg via ORAL
  Filled 2012-12-30 (×8): qty 2
  Filled 2012-12-30: qty 1
  Filled 2012-12-30 (×18): qty 2

## 2012-12-30 MED ORDER — METHOCARBAMOL 100 MG/ML IJ SOLN
500.0000 mg | Freq: Four times a day (QID) | INTRAVENOUS | Status: DC | PRN
  Filled 2012-12-30: qty 5

## 2012-12-30 MED ORDER — ACETAMINOPHEN 325 MG PO TABS: 325.0000 mg | ORAL_TABLET | ORAL | Status: DC | PRN

## 2012-12-30 MED ORDER — AMLODIPINE BESYLATE 5 MG PO TABS
5.0000 mg | ORAL_TABLET | Freq: Every day | ORAL | Status: DC
  Administered 2012-12-31 – 2013-01-07 (×8): 5 mg via ORAL
  Filled 2012-12-30 (×9): qty 1

## 2012-12-30 MED ORDER — MENTHOL 3 MG MT LOZG
1.0000 | LOZENGE | OROMUCOSAL | Status: DC | PRN
  Filled 2012-12-30: qty 9

## 2012-12-30 MED ORDER — GUAIFENESIN-DM 100-10 MG/5ML PO SYRP: 5.0000 mL | ORAL_SOLUTION | Freq: Four times a day (QID) | ORAL | Status: DC | PRN

## 2012-12-30 MED ORDER — WARFARIN SODIUM 5 MG PO TABS
5.0000 mg | ORAL_TABLET | Freq: Once | ORAL | Status: DC
  Administered 2012-12-30: 5 mg via ORAL
  Filled 2012-12-30: qty 1

## 2012-12-30 MED ORDER — METHOCARBAMOL 500 MG PO TABS
500.0000 mg | ORAL_TABLET | Freq: Four times a day (QID) | ORAL | Status: DC | PRN
  Administered 2013-01-05: 500 mg via ORAL
  Filled 2012-12-30: qty 1

## 2012-12-30 MED ORDER — DSS 100 MG PO CAPS: 100.0000 mg | ORAL_CAPSULE | Freq: Two times a day (BID) | ORAL | Status: DC

## 2012-12-30 MED ORDER — METHOCARBAMOL 500 MG PO TABS: 500.0000 mg | ORAL_TABLET | Freq: Four times a day (QID) | ORAL | Status: DC | PRN

## 2012-12-30 MED ORDER — ALUM & MAG HYDROXIDE-SIMETH 200-200-20 MG/5ML PO SUSP: 30.0000 mL | ORAL | Status: DC | PRN

## 2012-12-30 MED ORDER — PHENOL 1.4 % MT LIQD
1.0000 | OROMUCOSAL | Status: DC | PRN
  Filled 2012-12-30: qty 177

## 2012-12-30 MED ORDER — ONDANSETRON HCL 4 MG PO TABS: 4.0000 mg | ORAL_TABLET | Freq: Four times a day (QID) | ORAL | Status: DC | PRN

## 2012-12-30 MED ORDER — TRAZODONE HCL 50 MG PO TABS
25.0000 mg | ORAL_TABLET | Freq: Every evening | ORAL | Status: DC | PRN
  Administered 2013-01-01 – 2013-01-06 (×7): 50 mg via ORAL
  Filled 2012-12-30 (×7): qty 1

## 2012-12-30 MED ORDER — OXYCODONE HCL 5 MG PO TABS: 5.0000 mg | ORAL_TABLET | ORAL | Status: DC | PRN

## 2012-12-30 MED ORDER — POLYETHYLENE GLYCOL 3350 17 G PO PACK: 17.0000 g | PACK | Freq: Every day | ORAL | Status: DC | PRN

## 2012-12-30 MED ORDER — POLYSACCHARIDE IRON COMPLEX 150 MG PO CAPS
150.0000 mg | ORAL_CAPSULE | Freq: Two times a day (BID) | ORAL | Status: DC
  Administered 2012-12-31 – 2013-01-06 (×14): 150 mg via ORAL
  Filled 2012-12-30 (×16): qty 1

## 2012-12-30 MED ORDER — ACETAMINOPHEN 325 MG PO TABS: 650.0000 mg | ORAL_TABLET | Freq: Four times a day (QID) | ORAL | Status: DC | PRN

## 2012-12-30 MED ORDER — POLYETHYLENE GLYCOL 3350 17 G PO PACK
17.0000 g | PACK | Freq: Every day | ORAL | Status: DC
  Administered 2012-12-31 – 2013-01-03 (×4): 17 g via ORAL
  Filled 2012-12-30 (×5): qty 1

## 2012-12-30 NOTE — Progress Notes (Signed)
Physical Therapy Treatment Patient Details Name: [name] MRN: [id number] DOB: 04-04-1951 Today's Date: 12/30/2012 Time: 1350-1407 PT Time Calculation (min): 17 min  PT Assessment / Plan / Recommendation Comments on Treatment Session       Follow Up Recommendations  CIR     Does the patient have the potential to tolerate intense rehabilitation     Barriers to Discharge        Equipment Recommendations  None recommended by PT    Recommendations for Other Services OT consult  Frequency 7X/week   Plan Discharge plan remains appropriate    Precautions / Restrictions Precautions Precautions: Knee Required Braces or Orthoses: Knee Immobilizer - Right;Knee Immobilizer - Left Knee Immobilizer - Right: Discontinue once straight leg raise with < 10 degree lag Knee Immobilizer - Left: Discontinue once straight leg raise with < 10 degree lag Restrictions Weight Bearing Restrictions: No Other Position/Activity Restrictions: WBAT   Pertinent Vitals/Pain     Mobility  Bed Mobility Bed Mobility: Sit to Supine Supine to Sit: 4: Min assist;3: Mod assist Sit to Supine: 3: Mod assist Details for Bed Mobility Assistance: Assist needed Bil LEs  Transfers Transfers: Sit to Stand;Stand to Sit Sit to Stand: 1: +2 Total assist;From chair/3-in-1;With armrests;With upper extremity assist Sit to Stand: Patient Percentage: 60% Stand to Sit: 3: Mod assist;To bed;With upper extremity assist Details for Transfer Assistance: physical A needed to rise, steady and control descent. Min cues for hand placement and BLE management. Ambulation/Gait Ambulation/Gait Assistance: 4: Min assist Ambulation Distance (Feet): 111 Feet Assistive device: Rolling walker Ambulation/Gait Assistance Details: min cues for posture and position from RW Gait Pattern: Step-to pattern;Decreased step length - right;Decreased step length - left    Exercises Total Joint Exercises Ankle Circles/Pumps: AROM;Supine;Both;15  reps Quad Sets: AROM;Supine;Both;20 reps Heel Slides: AAROM;Supine;Both;20 reps Straight Leg Raises: AAROM;Both;Supine;20 reps   PT Diagnosis:    PT Problem List:   PT Treatment Interventions:     PT Goals Acute Rehab PT Goals PT Goal Formulation: With patient Time For Goal Achievement: 01/04/13 Potential to Achieve Goals: Good Pt will go Supine/Side to Sit: with min assist PT Goal: Supine/Side to Sit - Progress: Progressing toward goal Pt will go Sit to Supine/Side: with min assist PT Goal: Sit to Supine/Side - Progress: Progressing toward goal Pt will go Sit to Stand: with min assist PT Goal: Sit to Stand - Progress: Progressing toward goal Pt will go Stand to Sit: with min assist PT Goal: Stand to Sit - Progress: Progressing toward goal Pt will Ambulate: 51 - 150 feet;with min assist PT Goal: Ambulate - Progress: Progressing toward goal  Visit Information  Last PT Received On: 12/30/12 Assistance Needed: +2    Subjective Data  Subjective: I might be leaving for rehab today Patient Stated Goal: Resume active lifestyle with decreased pain   Cognition  Overall Cognitive Status: Appears within functional limits for tasks assessed/performed Arousal/Alertness: Awake/alert Orientation Level: Appears intact for tasks assessed Behavior During Session: [facility] for tasks performed    Balance     End of Session PT - End of Session Equipment Utilized During Treatment: Gait belt;Left knee immobilizer Activity Tolerance: Patient tolerated treatment well Patient left: with call bell/phone within reach;with family/visitor present;in bed Nurse Communication: Mobility status   GP     [name],[name] 12/30/2012, 2:09 PM

## 2012-12-30 NOTE — Progress Notes (Signed)
Met with patient at bedside this AM. Pt is eager to come to CIR. Await approval from pt's insurance co to admit to CIR.  For questions: [phone]

## 2012-12-30 NOTE — Progress Notes (Signed)
ANTICOAGULATION CONSULT NOTE - Follow Up Consult  Pharmacy Consult for Warfarin Indication: VTE prophylaxis s/p bilateral TKA  No Known Allergies  Patient Measurements: Height: 6' (182.9 cm) Weight: 274 lb (124.286 kg) IBW/kg (Calculated) : 77.6   Vital Signs: Temp: 98.8 F (37.1 C) (01/02 0623) Temp src: Oral (01/02 0623) BP: 138/70 mmHg (01/02 0623) Pulse Rate: 94  (01/02 0623)  Labs:  Basename 12/30/12 0410 12/29/12 0442 12/28/12 0425  HGB 9.3* 10.2* --  HCT 26.8* 29.5* 34.5*  PLT 147* 146* 162  APTT -- -- --  LABPROT 15.3* 14.1 14.1  INR 1.23 1.10 1.10  HEPARINUNFRC -- -- --  CREATININE -- 0.75 0.94  CKTOTAL -- -- --  CKMB -- -- --  TROPONINI -- -- --   Estimated Creatinine Clearance: 135.4 ml/min (by C-G formula based on Cr of 0.75).  Medications:  Scheduled:     . alfuzosin  10 mg Oral QHS  . amLODipine  5 mg Oral QAC breakfast  . dexamethasone  10 mg Oral Once   Or  . dexamethasone  10 mg Intravenous Once  . docusate sodium  100 mg Oral BID  . enoxaparin (LOVENOX) injection  30 mg Subcutaneous Custom  . [COMPLETED] enoxaparin (LOVENOX) injection  30 mg Subcutaneous Once  . levothyroxine  125 mcg Oral QAC breakfast  . scopolamine  1 patch Transdermal Once  . warfarin  3 mg Oral ONCE-1800  . [COMPLETED] warfarin  5 mg Oral ONCE-1800  . Warfarin - Pharmacist Dosing Inpatient   Does not apply q1800  . [DISCONTINUED] enoxaparin (LOVENOX) injection  30 mg Subcutaneous Q12H  . [DISCONTINUED] warfarin  5 mg Oral ONCE-1800    Assessment:  75 yoM with osteoarthritis s/p bilateral TKA 12/30.   Coumadin for post-op VTE prophylaxis x 4 weeks, then resume aspirin 81mg  daily.  INR 1.23 today after 5mg  x 1 on 1/1 (3mg  dose ordered 12/31 but not given, reason unknown)  Lovenox 30mg  SQ q12 started at 1am today (12 hr after epidural catheter removal 1/1 at 1pm)  Plan transfer to [facility] today   Goal of Therapy:  INR 2-3   Plan:   Repeat Coumadin 5mg  today  at 6pm  Continue Lovenox 30mg  SQ q12h until INR therapeutic  Continue daily PT/INR  Educated patient on coumadin therapy - excellent understanding as he has taken in the past after hip replacement  Rollene Fare PharmD Pager [phone] 12/30/2012,10:26 AM

## 2012-12-30 NOTE — Progress Notes (Signed)
1540 report given to grace RN at [facility], [facility]  To transport patient  D franklin

## 2012-12-30 NOTE — Progress Notes (Signed)
Patient transfer to [facility] via [facility]. Immobilizers in place to bilateral knees. Pain denied. Pain med offered prior transfer with refusal. Belongings taken with patient.

## 2012-12-30 NOTE — Progress Notes (Signed)
CARE MANAGEMENT NOTE 12/30/2012  Patient:  [name], [name]   Account Number:  [id number]  Date Initiated:  12/28/2012  Documentation initiated by:  Colleen Can  Subjective/Objective Assessment:   dx osteoarthritis bilateral knees; bilateral knee replacemnt     Action/Plan:   CM spoke with patient.  Plans are for inpatient rehab at [facility] for 1st choice; 2nd choice SNF rehab   Anticipated DC Date:  12/31/2012   Anticipated DC Plan:  IP REHAB FACILITY  In-house referral  Clinical Social Worker      DC Planning Services  CM consult      PAC Choice  IP REHAB   Choice offered to / List presented to:  C-1 Patient           Status of service:  Completed, signed off Medicare Important Message given?  NO (If response is "NO", the following Medicare IM given date fields will be blank) Date Medicare IM given:   Date Additional Medicare IM given:    Discharge Disposition:  IP REHAB FACILITY

## 2012-12-30 NOTE — Progress Notes (Signed)
   Subjective: 3 Days Post-Op Procedure(s) (LRB): TOTAL KNEE BILATERAL (Bilateral) Patient reports pain as mild.   Patient seen in rounds with Dr. Lequita Halt. Patient is well, and has had no acute complaints or problems Patient is ready to go [facility] today.  Objective: Vital signs in last 24 hours: Temp:  [98.7 F (37.1 C)-98.9 F (37.2 C)] 98.8 F (37.1 C) (01/02 0623) Pulse Rate:  [92-98] 94  (01/02 0623) Resp:  [14-18] 16  (01/02 0623) BP: (138-147)/(70-78) 138/70 mmHg (01/02 0623) SpO2:  [92 %-93 %] 92 % (01/02 0623)  Intake/Output from previous day:  Intake/Output Summary (Last 24 hours) at 12/30/12 0724 Last data filed at 12/30/12 1914  Gross per 24 hour  Intake 1960.5 ml  Output   1775 ml  Net  185.5 ml    Labs:  Basename 12/30/12 0410 12/29/12 0442 12/28/12 0425  HGB 9.3* 10.2* 11.7*    Basename 12/30/12 0410 12/29/12 0442  WBC 7.3 7.9  RBC 3.15* 3.42*  HCT 26.8* 29.5*  PLT 147* 146*    Basename 12/29/12 0442 12/28/12 0425  NA 132* 136  K 3.8 4.0  CL 99 102  CO2 28 28  BUN 8 10  CREATININE 0.75 0.94  GLUCOSE 134* 170*  CALCIUM 8.4 8.1*    Basename 12/30/12 0410 12/29/12 0442  LABPT -- --  INR 1.23 1.10    EXAM: General - Patient is Alert, Appropriate and Oriented Extremity - Neurovascular intact Sensation intact distally Dorsiflexion/Plantar flexion intact No cellulitis present to both legs Incision - clean, dry, no drainage, healing to both knees Motor Function - intact, moving foot and toes well on exam.   Assessment/Plan: 3 Days Post-Op Procedure(s) (LRB): TOTAL KNEE BILATERAL (Bilateral) Procedure(s) (LRB): TOTAL KNEE BILATERAL (Bilateral) Past Medical History  Diagnosis Date  . COLONIC POLYPS, HX OF 2007  . Arthritis     Knees  . OBESITY   . HYPOTHYROIDISM   . HYPERTENSION   . BPH (benign prostatic hypertrophy)    Principal Problem:  *OA (osteoarthritis) of knee Active Problems:  Postop Hyponatremia  Postop  Acute blood loss anemia  Estimated Body mass index is 37.16 kg/(m^2) as calculated from the following:   Height as of this encounter: 6\' 0" (1.829 m).   Weight as of this encounter: 274 lb(124.286 kg). Discharge to CIR Diet - Regular diet Follow up - in 2 weeks Activity - WBAT [facility]position - [facility] Condition Upon Discharge - Good D/C Meds - See DC Summary DVT Prophylaxis - Lovenox and Coumadin Take Coumadin for four weeks and then discontinue.  The dose may need to be adjusted based upon the INR.  Please follow the INR and titrate Coumadin dose for a therapeutic range between 2.0 and 3.0 INR.  After completing the four weeks of Coumadin, the patient may stop the Coumadin and resume their 81 mg Aspirin daily. Continue Lovenox injections until the INR is therapeutic at or greater than 2.0.  When INR reaches the therapeutic level of equal to or greater than 2.0, the patient may discontinue the Lovenox injections.  PERKINS, ALEXZANDREW 12/30/2012, 7:24 AM

## 2012-12-30 NOTE — PMR Pre-admission (Signed)
PMR Admission Coordinator Pre-Admission Assessment  Patient: [name] is an 62 y.o., male MRN: [id number] DOB: 05-23-1951 Height: 6' (182.9 cm) Weight: 124.286 kg (274 lb)              Insurance Information  PPO: yes      PRIMARY: UMR      Policy#: [id number]      Subscriber: self CM Name: Jae Dire      Phone#: [phone]     Fax#: [phone] Pre-Cert#: [id number]      Employer: [employer] Benefits:  Phone #: [phone]     Name: Lucianne Lei. Date: 12/29/12     Deduct: $750      Out of Pocket Max:       Life Max:  CIR: 80/20%      SNF:  Outpatient:      Co-Pay:  Home Health:       Co-Pay:  DME: 60/40%    Co-Pay:  Providers: In network  Emergency Contact Information Contact Information    Name Relation Home Work Mobile   Cajuste,Brenda Spouse [phone]     Current Medical History  Patient Admitting Diagnosis: Bilateral TKA's for endstage OA  History of Present Illness:61 y.o. male with history of obesity, HTN, bilateral knee pain due to DJD and failure of conservative therapy. Patient elected to undergo B-TKR on 12/27/12 by Dr. Despina Hick. Post op WBAT with CPM for PROM and coumadin for DVT prophylaxis.     Past Medical History  Past Medical History  Diagnosis Date  . COLONIC POLYPS, HX OF 2007  . Arthritis     Knees  . OBESITY   . HYPOTHYROIDISM   . HYPERTENSION   . BPH (benign prostatic hypertrophy)       (R) hip replacement 2 yrs ago  Family History  family history includes Arthritis in his other; Diabetes in his other; Heart disease in his father; and Hypertension in his other.  Prior Rehab/Hospitalizations: HH and OP therapies after hip replacement  Current Medications  Current facility-administered medications:acetaminophen (TYLENOL) suppository 650 mg, 650 mg, Rectal, Q6H PRN, Loanne Drilling, MD;  acetaminophen (TYLENOL) tablet 650 mg, 650 mg, Oral, Q6H PRN, Loanne Drilling, MD;  alfuzosin (UROXATRAL) 24 hr tablet 10 mg, 10 mg, Oral,  QHS, Loanne Drilling, MD, 10 mg at 12/29/12 2220;  amLODipine (NORVASC) tablet 5 mg, 5 mg, Oral, QAC breakfast, Loanne Drilling, MD, 5 mg at 12/30/12 3762 bisacodyl (DULCOLAX) suppository 10 mg, 10 mg, Rectal, Daily PRN, Loanne Drilling, MD;  dexamethasone (DECADRON) injection 10 mg, 10 mg, Intravenous, Once, Loanne Drilling, MD;  dexamethasone (DECADRON) tablet 10 mg, 10 mg, Oral, Once, Loanne Drilling, MD;  diphenhydrAMINE (BENADRYL) 12.5 MG/5ML elixir 12.5-25 mg, 12.5-25 mg, Oral, Q4H PRN, Loanne Drilling, MD docusate sodium (COLACE) capsule 100 mg, 100 mg, Oral, BID, Loanne Drilling, MD, 100 mg at 12/30/12 0836;  enoxaparin (LOVENOX) injection 30 mg, 30 mg, Subcutaneous, Custom, Loanne Drilling, MD;  levothyroxine (SYNTHROID, LEVOTHROID) tablet 125 mcg, 125 mcg, Oral, QAC breakfast, Loanne Drilling, MD, 125 mcg at 12/30/12 0836;  menthol-cetylpyridinium (CEPACOL) lozenge 3 mg, 1 lozenge, Oral, PRN, Loanne Drilling, MD methocarbamol (ROBAXIN) 500 mg in dextrose 5 % 50 mL IVPB, 500 mg, Intravenous, Q6H PRN, Loanne Drilling, MD, 500 mg at 12/27/12 1421;  methocarbamol (ROBAXIN) tablet 500 mg, 500 mg, Oral, Q6H PRN, Loanne Drilling, MD, 500 mg at 12/30/12 0341;  metoCLOPramide (REGLAN) injection 5-10 mg, 5-10  mg, Intravenous, Q8H PRN, Loanne Drilling, MD;  metoCLOPramide (REGLAN) tablet 5-10 mg, 5-10 mg, Oral, Q8H PRN, Loanne Drilling, MD morphine 2 MG/ML injection 1-2 mg, 1-2 mg, Intravenous, Q1H PRN, Loanne Drilling, MD;  ondansetron [facility]) injection 4 mg, 4 mg, Intravenous, Q6H PRN, Loanne Drilling, MD;  ondansetron (ZOFRAN) tablet 4 mg, 4 mg, Oral, Q6H PRN, Loanne Drilling, MD;  oxyCODONE (Oxy IR/ROXICODONE) immediate release tablet 5-10 mg, 5-10 mg, Oral, Q3H PRN, Loanne Drilling, MD, 10 mg at 12/30/12 1326 phenol (CHLORASEPTIC) mouth spray 1 spray, 1 spray, Mouth/Throat, PRN, Loanne Drilling, MD;  polyethylene glycol (MIRALAX / GLYCOLAX) packet 17 g, 17 g, Oral, Daily PRN, Loanne Drilling, MD;   scopolamine (TRANSDERM-SCOP) 1.5 MG 1.5 mg, 1 patch, Transdermal, Once, Eilene Ghazi, MD;  traMADol Janean Sark) tablet 50-100 mg, 50-100 mg, Oral, Q6H PRN, Loanne Drilling, MD;  warfarin (COUMADIN) tablet 5 mg, 5 mg, Oral, ONCE-1800, Rollene Fare, PHARMD Warfarin - Pharmacist Dosing Inpatient, , Does not apply, q1800, Loanne Drilling, MD;  zolpidem Remus Loffler) tablet 5 mg, 5 mg, Oral, QHS PRN, Alexzandrew Perkins, PA, 5 mg at 12/28/12 2206  Patients Current Diet: General  Precautions / Restrictions Precautions Precautions: Knee Restrictions Weight Bearing Restrictions: No Other Position/Activity Restrictions: WBAT   Prior Activity Level Community (5-7x/wk): Active daily Home Assistive Devices / Equipment Home Assistive Devices/Equipment: Contact lenses;Eyeglasses  Prior Functional Level Prior Function Level of Independence: Independent Able to Take Stairs?: Yes Driving: Yes Vocation: Part time employment Comments: Pt plans rehab following d/c.  Current Functional Level Cognition  Arousal/Alertness: Awake/alert Overall Cognitive Status: Appears within functional limits for tasks assessed/performed Orientation Level: Oriented X4    Extremity Assessment (includes Sensation/Coordination)  RUE ROM/Strength/Tone: WFL for tasks assessed  RLE ROM/Strength/Tone: Deficits RLE ROM/Strength/Tone Deficits: Quads 2+/5 with AAROM at inee -10 - 40    ADLs  Grooming: Set up Where Assessed - Grooming: Unsupported sitting Upper Body Bathing: Set up Where Assessed - Upper Body Bathing: Unsupported sitting Lower Body Bathing: +1 Total assistance Where Assessed - Lower Body Bathing: Supported sit to stand Upper Body Dressing: Set up Where Assessed - Upper Body Dressing: Unsupported sitting Lower Body Dressing: +1 Total assistance Where Assessed - Lower Body Dressing: Supported sit to Pharmacist, hospital: +2 Total assistance Toilet Transfer: Patient Percentage: 60% Statistician Method:  Sit to Barista: Other (comment) (recliner) Toileting - Clothing Manipulation and Hygiene: +1 Total assistance Where Assessed - Toileting Clothing Manipulation and Hygiene: Sit to stand from 3-in-1 or toilet Equipment Used: Rolling walker;Gait belt ADL Comments: Pt mobilizing well. Min cues for ambulation for correct and safe technique.    Mobility  Bed Mobility: Sit to Supine Supine to Sit: 4: Min assist;3: Mod assist Supine to Sit: Patient Percentage: 60% Sit to Supine: 3: Mod assist    Transfers  Transfers: Sit to Stand;Stand to Sit Sit to Stand: 1: +2 Total assist;From chair/3-in-1;With armrests;With upper extremity assist Sit to Stand: Patient Percentage: 60% Stand to Sit: 3: Mod assist;To bed;With upper extremity assist Stand to Sit: Patient Percentage: 70%    Ambulation / Gait / Stairs / Wheelchair Mobility  Ambulation/Gait Ambulation/Gait Assistance: 4: Min assist Ambulation/Gait: Patient Percentage: 80% Ambulation Distance (Feet): 111 Feet Assistive device: Rolling walker Ambulation/Gait Assistance Details: min cues for posture and position from RW Gait Pattern: Step-to pattern;Decreased step length - right;Decreased step length - left    Posture / Balance      Special needs/care consideration  BiPAP/CPAP - No CPM   Yes Continuous Drip IV -Yes KVO Dialysis - No         Life Vest - No Oxygen - No Special Bed - No Trach Size - No Wound Vac (area) - No      Skin (B) knee incisions                           Bowel mgmt: WNL Bladder mgmt: WNL Diabetic mgmt  N/a    Previous Home Environment Living Arrangements: Spouse/significant other Lives With: Spouse Available Help at Discharge: Family Type of Home: House Home Care Services: No  Discharge Living Setting Plans for Discharge Living Setting: Patient's home;Lives with Wife Type of Home at Discharge: House Discharge Home Layout: Two level Alternate Level Stairs-Rails: Right Alternate  Level Stairs-Number of Steps: 13 Discharge Home Access: Stairs to enter Entrance Stairs-Rails: Can reach both Entrance Stairs-Number of Steps: 2 Discharge Bathroom Shower/Tub: Walk-in shower Discharge Bathroom Toilet: Handicapped height Discharge Bathroom Accessibility: Yes How Accessible: Accessible via walker Do you have any problems obtaining your medications?: No  Social/Family/Support Systems Patient Roles: Spouse;Parent Contact Information: [phone] Anticipated Caregiver: Spouse: Andros Channing. (Wife is  night shift Charity fundraiser at [employer]). Daughter available to help as needed. Anticipated Caregiver's Contact Information: Dhyan Noah: [phone] Ability/Limitations of Caregiver: none Caregiver Availability: 24/7 Discharge Plan Discussed with Primary Caregiver: Yes Is Caregiver In Agreement with Plan?: Yes Does Caregiver/Family have Issues with Lodging/Transportation while Pt is in Rehab?: No  Goals/Additional Needs Patient/Family Goal for Rehab: Mod I overall Expected length of stay: 7 days Cultural Considerations: none Dietary Needs: Regular Equipment Needs: TBD Pt/Family Agrees to Admission and willing to participate: Yes Program Orientation Provided & Reviewed with Pt/Caregiver Including Roles  & Responsibilities: Yes  Decrease burden of Care through IP rehab admission: n/a Possible need for SNF placement upon discharge:n/a  Patient Condition: Please see physician update to information in consult dated 12/27/12.  Preadmission Screen Completed By:  Meryl Dare, 12/30/2012 2:19 PM ______________________________________________________________________   Discussed status with Dr. Riley Kill on1/2/14at 2:30PM and received telephone approval for admission today.  Admission Coordinator:  Meryl Dare, time 2:38 PM/Date 12/30/12

## 2012-12-30 NOTE — Progress Notes (Signed)
Patient arrived via [facility]. The transport uneventful, patient arrived with belongings. Patient with knee immobilizers in place, and no complaints of pain. Patient oriented to rehab, and questions answered.

## 2012-12-30 NOTE — H&P (Signed)
Physical Medicine and Rehabilitation Admission H&P    CC: Endstage DJD bilateral knees  HPI:  [name] is a 62 y.o. male with history of obesity, HTN, bilateral knee pain due to DJD and failure of conservative therapy. Patient elected to undergo B-TKR on 12/27/12 by Dr. Despina Hick. Post op WBAT with CPM for PROM and on lovenox/coumadin bridge for DVT prophylaxis. ABLA noted with hgb down to 9.3 today. Mild hyponatremia likely dilutional due to IVF. Therapies are ongoing and team recommending CIR. Patient admitted for progressive therapy.   Review of Systems  HENT: Negative for hearing loss.   Eyes: Negative for blurred vision and double vision.  Respiratory: Positive for cough. Negative for sputum production and shortness of breath.   Cardiovascular: Negative for chest pain and palpitations.  Gastrointestinal: Positive for constipation. Negative for heartburn, nausea and abdominal pain.  Genitourinary: Negative for urgency and frequency.  Musculoskeletal: Positive for joint pain. Negative for myalgias and back pain.  Neurological: Negative for sensory change, speech change and headaches.  Psychiatric/Behavioral: The patient has insomnia. The patient is not nervous/anxious.    Past Medical History  Diagnosis Date  . COLONIC POLYPS, HX OF 2007  . Arthritis     Knees  . OBESITY   . HYPOTHYROIDISM   . HYPERTENSION   . BPH (benign prostatic hypertrophy)    Past Surgical History  Procedure Date  . Tonsillectomy   . Laminectomy 1991  . Arthroscopic knee surgery     (R) 2003 & (L) 2010  . Total hip arthroplasty 01/2011    R - alusio  . Tonsillectomy   . Total knee arthroplasty 12/27/2012    Procedure: TOTAL KNEE BILATERAL;  Surgeon: Loanne Drilling, MD;  Location: [facility];  Service: Orthopedics;  Laterality: Bilateral;   Family History  Problem Relation Age of Onset  . Heart disease Father   . Arthritis Other     Parent & grandparents  . Diabetes Other     parents  .  Hypertension Other     parents   Social History: Married. Works part time at Public librarian at [employer]. Wife is Charity fundraiser at [employer].  He reports that he has never smoked. He has never used smokeless tobacco. He reports that he drinks alcohol. He reports that he does not use illicit drugs.   Allergies: No Known Allergies  Scheduled Meds:   . alfuzosin  10 mg Oral QHS  . amLODipine  5 mg Oral QAC breakfast  . dexamethasone  10 mg Oral Once   Or  . dexamethasone  10 mg Intravenous Once  . docusate sodium  100 mg Oral BID  . enoxaparin (LOVENOX) injection  30 mg Subcutaneous Custom  . levothyroxine  125 mcg Oral QAC breakfast  . scopolamine  1 patch Transdermal Once  . warfarin  5 mg Oral ONCE-1800  . Warfarin - Pharmacist Dosing Inpatient   Does not apply q1800     Medications Prior to Admission  Medication Sig Dispense Refill  . alfuzosin (UROXATRAL) 10 MG 24 hr tablet Take 10 mg by mouth every evening.       Marland Kitchen amLODipine (NORVASC) 5 MG tablet Take 5 mg by mouth daily before breakfast.      . levothyroxine (SYNTHROID, LEVOTHROID) 125 MCG tablet Take 125 mcg by mouth daily before breakfast.      . [DISCONTINUED] naproxen sodium (ANAPROX) 220 MG tablet Take 220 mg by mouth. 2 tablets as needed for pain      . [  DISCONTINUED] aspirin 81 MG tablet Take 81 mg by mouth daily.       . [DISCONTINUED] Cholecalciferol (VITAMIN D3) 1000 UNITS CAPS Take 1 capsule by mouth daily.       . [DISCONTINUED] Multiple Vitamin (MULTIVITAMIN) tablet Take 1 tablet by mouth daily.       . [DISCONTINUED] Omega-3 Fatty Acids (FISH OIL) 1200 MG CAPS Take 1,200 mg by mouth daily.         Home: Home Living Lives With: Spouse Available Help at Discharge: Family Type of Home: House   Functional History: Prior Function Able to Take Stairs?: Yes Driving: Yes Vocation: Part time employment Comments: Pt plans rehab following d/c.  Functional Status:  Mobility: Bed Mobility Bed Mobility: Sit to Supine Supine to Sit:  3: Mod assist Supine to Sit: Patient Percentage: 60% Sit to Supine: 3: Mod assist Transfers Transfers: Sit to Stand;Stand to Sit Sit to Stand: 1: +2 Total assist;With upper extremity assist;With armrests;From chair/3-in-1 Sit to Stand: Patient Percentage: 60% Stand to Sit: 1: +2 Total assist;With upper extremity assist;To bed Stand to Sit: Patient Percentage: 70% Ambulation/Gait Ambulation/Gait Assistance: 1: +2 Total assist Ambulation/Gait: Patient Percentage: 80% Ambulation Distance (Feet): 92 Feet Assistive device: Rolling walker Ambulation/Gait Assistance Details: min cues for posture and position from RW Gait Pattern: Step-to pattern;Decreased step length - right;Decreased step length - left    ADL: ADL Grooming: Set up Where Assessed - Grooming: Unsupported sitting Upper Body Bathing: Set up Where Assessed - Upper Body Bathing: Unsupported sitting Lower Body Bathing: +1 Total assistance Where Assessed - Lower Body Bathing: Supported sit to stand Upper Body Dressing: Set up Where Assessed - Upper Body Dressing: Unsupported sitting Lower Body Dressing: +1 Total assistance Where Assessed - Lower Body Dressing: Supported sit to Pharmacist, hospital: +2 Total assistance Toilet Transfer Method: Sit to Barista: Other (comment) (recliner) Equipment Used: Rolling walker;Gait belt ADL Comments: Pt mobilizing well. Min cues for ambulation for correct and safe technique.  Cognition: Cognition Arousal/Alertness: Awake/alert Orientation Level: Oriented X4 Cognition Overall Cognitive Status: Appears within functional limits for tasks assessed/performed Arousal/Alertness: Awake/alert Orientation Level: Appears intact for tasks assessed Behavior During Session: [facility] for tasks performed   Blood pressure 138/70, pulse 94, temperature 98.8 F (37.1 C), temperature source Oral, resp. rate 16, height 6' (1.829 m), weight 124.286 kg (274 lb), SpO2  92.00%. Physical Exam  Nursing note and vitals reviewed. Constitutional: He is oriented to person, place, and time. He appears well-developed and well-nourished.  HENT:  Head: Normocephalic and atraumatic.  Right Ear: External ear normal.  Left Ear: External ear normal.  Eyes: Conjunctivae normal and EOM are normal. Pupils are equal, round, and reactive to light.  Neck: Normal range of motion. Neck supple. No JVD present. No tracheal deviation present. No thyromegaly present.  Cardiovascular: Normal rate, regular rhythm and normal heart sounds.   No murmur heard. Pulmonary/Chest: Effort normal and breath sounds normal. No respiratory distress. He has no wheezes.  Abdominal: Soft. Bowel sounds are normal. He exhibits no distension. There is no tenderness.  Musculoskeletal: He exhibits edema and tenderness (with ROM bilateral knees. ).       Knee incisions clean and intact with minimal drainage. Legs are appropriately tender   Lymphadenopathy:    He has no cervical adenopathy.  Neurological: He is alert and oriented to person, place, and time. No cranial nerve deficit. Coordination normal.  Skin: Skin is warm and dry. There is erythema (RLE--blancable. ).  Bilateral knee incisions clean and dry with  Steri strips in place (dry blood). Moderate edema bilateral knees down to foot. Minimal warmth RLE.   Psychiatric: He has a normal mood and affect. His behavior is normal. Judgment and thought content normal.    Results for orders placed during the hospital encounter of 12/27/12 (from the past 48 hour(s))  CBC     Status: Abnormal   Collection Time   12/29/12  4:42 AM      Component Value Range Comment   WBC 7.9  4.0 - 10.5 K/uL    RBC 3.42 (*) 4.22 - 5.81 MIL/uL    Hemoglobin 10.2 (*) 13.0 - 17.0 g/dL    HCT 78.2 (*) 95.6 - 52.0 %    MCV 86.3  78.0 - 100.0 fL    MCH 29.8  26.0 - 34.0 pg    MCHC 34.6  30.0 - 36.0 g/dL    RDW 21.3  08.6 - 57.8 %    Platelets 146 (*) 150 - 400 K/uL    BASIC METABOLIC PANEL     Status: Abnormal   Collection Time   12/29/12  4:42 AM      Component Value Range Comment   Sodium 132 (*) 135 - 145 mEq/L    Potassium 3.8  3.5 - 5.1 mEq/L    Chloride 99  96 - 112 mEq/L    CO2 28  19 - 32 mEq/L    Glucose, Bld 134 (*) 70 - 99 mg/dL    BUN 8  6 - 23 mg/dL    Creatinine, Ser 4.69  0.50 - 1.35 mg/dL    Calcium 8.4  8.4 - 62.9 mg/dL    GFR calc non Af Amer >90  >90 mL/min    GFR calc Af Amer >90  >90 mL/min   PROTIME-INR     Status: Normal   Collection Time   12/29/12  4:42 AM      Component Value Range Comment   Prothrombin Time 14.1  11.6 - 15.2 seconds    INR 1.10  0.00 - 1.49   CBC     Status: Abnormal   Collection Time   12/30/12  4:10 AM      Component Value Range Comment   WBC 7.3  4.0 - 10.5 K/uL    RBC 3.15 (*) 4.22 - 5.81 MIL/uL    Hemoglobin 9.3 (*) 13.0 - 17.0 g/dL    HCT 52.8 (*) 41.3 - 52.0 %    MCV 85.1  78.0 - 100.0 fL    MCH 29.5  26.0 - 34.0 pg    MCHC 34.7  30.0 - 36.0 g/dL    RDW 24.4  01.0 - 27.2 %    Platelets 147 (*) 150 - 400 K/uL   PROTIME-INR     Status: Abnormal   Collection Time   12/30/12  4:10 AM      Component Value Range Comment   Prothrombin Time 15.3 (*) 11.6 - 15.2 seconds    INR 1.23  0.00 - 1.49    No results found.  Post Admission Physician Evaluation: 1. Functional deficits secondary  to endstage OA of the bilateral knees s/p bilateral TKA's. 2. Patient is admitted to receive collaborative, interdisciplinary care between the physiatrist, rehab nursing staff, and therapy team. 3. Patient's level of medical complexity and substantial therapy needs in context of that medical necessity cannot be provided at a lesser intensity of care such as a SNF. 4. Patient has experienced  substantial functional loss from his/her baseline which was documented above under the "Functional History" and "Functional Status" headings.  Judging by the patient's diagnosis, physical exam, and functional history, the patient  has potential for functional progress which will result in measurable gains while on inpatient rehab.  These gains will be of substantial and practical use upon discharge  in facilitating mobility and self-care at the household level. 5. Physiatrist will provide 24 hour management of medical needs as well as oversight of the therapy plan/treatment and provide guidance as appropriate regarding the interaction of the two. 6. 24 hour rehab nursing will assist with bladder management, bowel management, safety, skin/wound care, disease management, medication administration, pain management and patient education  and help integrate therapy concepts, techniques,education, etc. 7. PT will assess and treat for:  Lower extremity strength, range of motion, stamina, balance, functional mobility, safety, adaptive techniques and equipment.  Goals are: mod I. 8. OT will assess and treat for: ADL's, functional mobility, safety, upper extremity strength, adaptive techniques and equipment.   Goals are: mod I. 9. SLP will assess and treat for: n/a.  Goals are: n/a. 10. Case Management and Social Worker will assess and treat for psychological issues and discharge planning. 11. Team conference will be held weekly to assess progress toward goals and to determine barriers to discharge. 12. Patient will receive at least 3 hours of therapy per day at least 5 days per week. 13. ELOS: one week      Prognosis:  excellent   Medical Problem List and Plan: 1. DVT Prophylaxis/Anticoagulation: Pharmaceutical: Coumadin and Lovenox 2. Pain Management: reports reasonable with prn meds.  3. Mood: Positive. LCSW to follow for formal evaluation.  4. Neuropsych: This patient is capable of making decisions on his/her own behalf. 5. ABLA:  Recheck in am. Will add iron supplement. 6. Hypothyroid: continue supplement 7. HTN:  Monitor with bid checks. Continue Norvasc. 8. Chronic constipation: On stool softeners without any results so far.  Will change to miralax daily. Suppository prn. 9. BPH: No voiding difficulties reported. Continue alfuzosin.   12/30/2012  Ivory Broad, MD

## 2012-12-30 NOTE — Progress Notes (Signed)
Physical Therapy Treatment Patient Details Name: [name] MRN: [id number] DOB: [birth year] Today's Date: 12/30/2012 Time: 4098-1191 PT Time Calculation (min): 40 min  PT Assessment / Plan / Recommendation Comments on Treatment Session       Follow Up Recommendations  CIR     Does the patient have the potential to tolerate intense rehabilitation     Barriers to Discharge        Equipment Recommendations  None recommended by PT    Recommendations for Other Services OT consult  Frequency 7X/week   Plan Discharge plan remains appropriate    Precautions / Restrictions Precautions Precautions: Knee Required Braces or Orthoses: Knee Immobilizer - Right;Knee Immobilizer - Left Knee Immobilizer - Right: Discontinue once straight leg raise with < 10 degree lag Knee Immobilizer - Left: Discontinue once straight leg raise with < 10 degree lag Restrictions Weight Bearing Restrictions: No Other Position/Activity Restrictions: WBAT   Pertinent Vitals/Pain 4/10; premed, cold packs provided    Mobility  Bed Mobility Bed Mobility: Supine to Sit Supine to Sit: 4: Min assist;3: Mod assist Details for Bed Mobility Assistance: Assist needed Bil LEs  Transfers Transfers: Sit to Stand;Stand to Sit Sit to Stand: 3: Mod assist;From elevated surface;From bed Stand to Sit: 3: Mod assist;To chair/3-in-1;With armrests;With upper extremity assist Details for Transfer Assistance: physical A needed to rise, steady and control descent. Min cues for hand placement and BLE management. Ambulation/Gait Ambulation/Gait Assistance: 4: Min assist;3: Mod assist Ambulation Distance (Feet): 100 Feet Assistive device: Rolling walker Ambulation/Gait Assistance Details: min cues for position from RW and posture; chair follow for saftey Gait Pattern: Step-to pattern;Decreased step length - right;Decreased step length - left    Exercises Total Joint Exercises Ankle Circles/Pumps: AROM;Supine;Both;15  reps Quad Sets: AROM;Supine;Both;20 reps Heel Slides: AAROM;Supine;Both;20 reps Straight Leg Raises: AAROM;Both;Supine;20 reps   PT Diagnosis:    PT Problem List:   PT Treatment Interventions:     PT Goals Acute Rehab PT Goals PT Goal Formulation: With patient Time For Goal Achievement: 01/04/13 Potential to Achieve Goals: Good Pt will go Supine/Side to Sit: with min assist PT Goal: Supine/Side to Sit - Progress: Progressing toward goal Pt will go Sit to Supine/Side: with min assist PT Goal: Sit to Supine/Side - Progress: Progressing toward goal Pt will go Sit to Stand: with min assist PT Goal: Sit to Stand - Progress: Progressing toward goal Pt will go Stand to Sit: with min assist PT Goal: Stand to Sit - Progress: Progressing toward goal Pt will Ambulate: 51 - 150 feet;with min assist PT Goal: Ambulate - Progress: Progressing toward goal  Visit Information  Last PT Received On: 12/30/12 Assistance Needed: +2    Subjective Data  Subjective: I might be leaving for rehab today Patient Stated Goal: Resume active lifestyle with decreased pain   Cognition  Overall Cognitive Status: Appears within functional limits for tasks assessed/performed Arousal/Alertness: Awake/alert Orientation Level: Appears intact for tasks assessed Behavior During Session: [name] for tasks performed    Balance     End of Session PT - End of Session Equipment Utilized During Treatment: Gait belt;Left knee immobilizer Activity Tolerance: Patient tolerated treatment well Patient left: in chair;with call bell/phone within reach;with family/visitor present Nurse Communication: Mobility status   GP     [name],[name] 12/30/2012, 12:52 PM

## 2012-12-30 NOTE — Discharge Summary (Signed)
Physician Discharge Summary   Patient ID: [name] MRN: [id number] DOB/AGE: Mar 01, 1951 62 y.o.  Admit date: 12/27/2012 Discharge date: 12/30/2012  Primary Diagnosis: Osteoarthritis Bilateral knee(s)  Admission Diagnoses:  Past Medical History  Diagnosis Date  . COLONIC POLYPS, HX OF 2007  . Arthritis     Knees  . OBESITY   . HYPOTHYROIDISM   . HYPERTENSION   . BPH (benign prostatic hypertrophy)    Discharge Diagnoses:   Principal Problem:  *OA (osteoarthritis) of knee Active Problems:  Postop Hyponatremia  Postop Acute blood loss anemia  Estimated Body mass index is 37.16 kg/(m^2) as calculated from the following:   Height as of this encounter: 6\' 0" (1.829 m).   Weight as of this encounter: 274 lb(124.286 kg).  Classification of overweight in adults according to BMI (WHO, 1998)   Procedure:  Procedure(s) (LRB): TOTAL KNEE BILATERAL (Bilateral)   Consults: [facility]  HPI: [name] is a 62 y.o. year old male with end stage OA of both knees with progressively worsening pain and dysfunction. He has constant pain, with activity and at rest and significant functional deficits with difficulties even with ADLs. He has had extensive non-op management including analgesics, injections of cortisone and viscosupplements, and home exercise program, but remains in significant pain with significant dysfunction. We discussed doing both in the same setting vs. Staged procedures doing one at a time. After discussing the procedure, risks, potential complications and rehab course involved in both procedures, he elected for bilateral TKA. He presents now for bilateral Total Knee Arthroplasty.   Laboratory Data: Admission on 12/27/2012  Component Date Value Range Status  . WBC 12/28/2012 7.7  4.0 - 10.5 K/uL Final  . RBC 12/28/2012 4.01* 4.22 - 5.81 MIL/uL Final  . Hemoglobin 12/28/2012 11.7* 13.0 - 17.0 g/dL Final  . HCT 40/98/1191 34.5* 39.0 - 52.0 % Final  . MCV  12/28/2012 86.0  78.0 - 100.0 fL Final  . MCH 12/28/2012 29.2  26.0 - 34.0 pg Final  . MCHC 12/28/2012 33.9  30.0 - 36.0 g/dL Final  . RDW 47/82/9562 12.7  11.5 - 15.5 % Final  . Platelets 12/28/2012 162  150 - 400 K/uL Final  . Sodium 12/28/2012 136  135 - 145 mEq/L Final  . Potassium 12/28/2012 4.0  3.5 - 5.1 mEq/L Final  . Chloride 12/28/2012 102  96 - 112 mEq/L Final  . CO2 12/28/2012 28  19 - 32 mEq/L Final  . Glucose, Bld 12/28/2012 170* 70 - 99 mg/dL Final  . BUN 13/07/6577 10  6 - 23 mg/dL Final  . Creatinine, Ser 12/28/2012 0.94  0.50 - 1.35 mg/dL Final  . Calcium 46/96/2952 8.1* 8.4 - 10.5 mg/dL Final  . GFR calc non Af Amer 12/28/2012 90* >90 mL/min Final  . GFR calc Af Amer 12/28/2012 >90  >90 mL/min Final   Comment:                                 The eGFR has been calculated                          using the CKD EPI equation.                          This calculation has not been  validated in all clinical                          situations.                          eGFR's persistently                          <90 mL/min signify                          possible Chronic Kidney Disease.  Marland Kitchen Prothrombin Time 12/28/2012 14.1  11.6 - 15.2 seconds Final  . INR 12/28/2012 1.10  0.00 - 1.49 Final  . WBC 12/29/2012 7.9  4.0 - 10.5 K/uL Final  . RBC 12/29/2012 3.42* 4.22 - 5.81 MIL/uL Final  . Hemoglobin 12/29/2012 10.2* 13.0 - 17.0 g/dL Final  . HCT 16/09/9603 29.5* 39.0 - 52.0 % Final  . MCV 12/29/2012 86.3  78.0 - 100.0 fL Final  . MCH 12/29/2012 29.8  26.0 - 34.0 pg Final  . MCHC 12/29/2012 34.6  30.0 - 36.0 g/dL Final  . RDW 54/08/8118 12.8  11.5 - 15.5 % Final  . Platelets 12/29/2012 146* 150 - 400 K/uL Final  . Sodium 12/29/2012 132* 135 - 145 mEq/L Final  . Potassium 12/29/2012 3.8  3.5 - 5.1 mEq/L Final  . Chloride 12/29/2012 99  96 - 112 mEq/L Final  . CO2 12/29/2012 28  19 - 32 mEq/L Final  . Glucose, Bld 12/29/2012 134* 70 - 99 mg/dL  Final  . BUN 14/78/2956 8  6 - 23 mg/dL Final  . Creatinine, Ser 12/29/2012 0.75  0.50 - 1.35 mg/dL Final  . Calcium 21/30/8657 8.4  8.4 - 10.5 mg/dL Final  . GFR calc non Af Amer 12/29/2012 >90  >90 mL/min Final  . GFR calc Af Amer 12/29/2012 >90  >90 mL/min Final   Comment:                                 The eGFR has been calculated                          using the CKD EPI equation.                          This calculation has not been                          validated in all clinical                          situations.                          eGFR's persistently                          <90 mL/min signify                          possible Chronic Kidney Disease.  Marland Kitchen Prothrombin Time 12/29/2012 14.1  11.6 - 15.2 seconds Final  . INR 12/29/2012  1.10  0.00 - 1.49 Final  . WBC 12/30/2012 7.3  4.0 - 10.5 K/uL Final  . RBC 12/30/2012 3.15* 4.22 - 5.81 MIL/uL Final  . Hemoglobin 12/30/2012 9.3* 13.0 - 17.0 g/dL Final  . HCT 91/47/8295 26.8* 39.0 - 52.0 % Final  . MCV 12/30/2012 85.1  78.0 - 100.0 fL Final  . MCH 12/30/2012 29.5  26.0 - 34.0 pg Final  . MCHC 12/30/2012 34.7  30.0 - 36.0 g/dL Final  . RDW 62/13/0865 12.9  11.5 - 15.5 % Final  . Platelets 12/30/2012 147* 150 - 400 K/uL Final  . Prothrombin Time 12/30/2012 15.3* 11.6 - 15.2 seconds Final  . INR 12/30/2012 1.23  0.00 - 1.49 Final  Hospital Outpatient Visit on 12/20/2012  Component Date Value Range Status  . aPTT 12/20/2012 33  24 - 37 seconds Final  . WBC 12/20/2012 4.5  4.0 - 10.5 K/uL Final  . RBC 12/20/2012 5.24  4.22 - 5.81 MIL/uL Final  . Hemoglobin 12/20/2012 15.2  13.0 - 17.0 g/dL Final  . HCT 78/46/9629 44.2  39.0 - 52.0 % Final  . MCV 12/20/2012 84.4  78.0 - 100.0 fL Final  . MCH 12/20/2012 29.0  26.0 - 34.0 pg Final  . MCHC 12/20/2012 34.4  30.0 - 36.0 g/dL Final  . RDW 52/84/1324 12.5  11.5 - 15.5 % Final  . Platelets 12/20/2012 193  150 - 400 K/uL Final  . Sodium 12/20/2012 139  135 - 145 mEq/L Final    . Potassium 12/20/2012 4.0  3.5 - 5.1 mEq/L Final  . Chloride 12/20/2012 105  96 - 112 mEq/L Final  . CO2 12/20/2012 25  19 - 32 mEq/L Final  . Glucose, Bld 12/20/2012 136* 70 - 99 mg/dL Final  . BUN 40/09/2724 10  6 - 23 mg/dL Final  . Creatinine, Ser 12/20/2012 0.74  0.50 - 1.35 mg/dL Final  . Calcium 36/64/4034 9.4  8.4 - 10.5 mg/dL Final  . Total Protein 12/20/2012 6.9  6.0 - 8.3 g/dL Final  . Albumin 74/25/9563 4.1  3.5 - 5.2 g/dL Final  . AST 87/56/4332 21  0 - 37 U/L Final  . ALT 12/20/2012 26  0 - 53 U/L Final  . Alkaline Phosphatase 12/20/2012 80  39 - 117 U/L Final  . Total Bilirubin 12/20/2012 0.4  0.3 - 1.2 mg/dL Final  . GFR calc non Af Amer 12/20/2012 >90  >90 mL/min Final  . GFR calc Af Amer 12/20/2012 >90  >90 mL/min Final   Comment:                                 The eGFR has been calculated                          using the CKD EPI equation.                          This calculation has not been                          validated in all clinical                          situations.  eGFR's persistently                          <90 mL/min signify                          possible Chronic Kidney Disease.  Marland Kitchen Prothrombin Time 12/20/2012 12.9  11.6 - 15.2 seconds Final  . INR 12/20/2012 0.98  0.00 - 1.49 Final  . ABO/RH(D) 12/20/2012 O POS   Final  . Antibody Screen 12/20/2012 NEG   Final  . Sample Expiration 12/20/2012 12/30/2012   Final  . Color, Urine 12/20/2012 YELLOW  YELLOW Final  . APPearance 12/20/2012 CLEAR  CLEAR Final  . Specific Gravity, Urine 12/20/2012 1.034* 1.005 - 1.030 Final  . pH 12/20/2012 6.0  5.0 - 8.0 Final  . Glucose, UA 12/20/2012 NEGATIVE  NEGATIVE mg/dL Final  . Hgb urine dipstick 12/20/2012 NEGATIVE  NEGATIVE Final  . Bilirubin Urine 12/20/2012 NEGATIVE  NEGATIVE Final  . Ketones, ur 12/20/2012 NEGATIVE  NEGATIVE mg/dL Final  . Protein, ur 16/09/9603 NEGATIVE  NEGATIVE mg/dL Final  . Urobilinogen, UA 12/20/2012  0.2  0.0 - 1.0 mg/dL Final  . Nitrite 54/08/8118 NEGATIVE  NEGATIVE Final  . Leukocytes, UA 12/20/2012 NEGATIVE  NEGATIVE Final   MICROSCOPIC NOT DONE ON URINES WITH NEGATIVE PROTEIN, BLOOD, LEUKOCYTES, NITRITE, OR GLUCOSE <1000 mg/dL.  Marland Kitchen MRSA, PCR 12/20/2012 NEGATIVE  NEGATIVE Final  . Staphylococcus aureus 12/20/2012 NEGATIVE  NEGATIVE Final   Comment:                                 The Xpert SA Assay (FDA                          approved for NASAL specimens                          in patients over 9 years of age),                          is one component of                          a comprehensive surveillance                          program.  Test performance has                          been validated by [employer] for patients greater                          than or equal to 45 year old.                          It is not intended                          to diagnose infection nor to  guide or monitor treatment.  Appointment on 12/02/2012  Component Date Value Range Status  . TSH 12/02/2012 4.41  0.35 - 5.50 uIU/mL Final     X-Rays:Dg Chest 2 View  12/20/2012  *RADIOLOGY REPORT*  Clinical Data: Preop  CHEST - 2 VIEW  Comparison: None.  Findings: Cardiomediastinal silhouette is unremarkable.  No acute infiltrate or pleural effusion.  No pulmonary edema.  Mild degenerative changes thoracic spine.  IMPRESSION: No active disease.  Mild degenerative changes thoracic spine.   Original Report Authenticated By: Natasha Mead, M.D.     EKG: Orders placed in visit on 12/02/12  . EKG 12-LEAD     Hospital Course: Patient was admitted to [facility] and taken to the OR and underwent the above stated procedure well without complications.  Patient tolerated the procedure well, Bilateral Total Knee Arthroplasty, and was later transferred to the recovery room and then to the orthopaedic floor for postoperative care. Anesthesia was  consulted postoperatively to place an epidural in for postoperative pain management. The patient was also given PO and IV analgesics for pain control following their surgery.  They were given 24 hours of postoperative antibiotics and started on DVT prophylaxis in the form of Lovenox and Coumadin after the epidural had been removed.   PT and OT were ordered for total joint protocol.  Discharge planning consulted to help with postop disposition and equipment needs.  The patient wanted to look into CIR.  Patient had a good night on the evening of surgery and started to get up OOB with therapy on day one. Hemovac drains were pulled without difficulty on day one.  Continued to work with therapy into day two.  Dressings were changed on day two and both incisions were healing well.  The epidural was removed with difficulty by Anesthesia on day two.  By day three, the patient started to show progress with therapy.  They continued to receive therapy each day for continued total knee protocol.  The incisions were healing well.  The patient was seen in rounds on day three and was ready to go to the [facility].  Discharge Medications: Prior to Admission medications   Medication Sig Start Date End Date Taking? Authorizing Provider  alfuzosin (UROXATRAL) 10 MG 24 hr tablet Take 10 mg by mouth every evening.    Yes Historical Provider, MD  amLODipine (NORVASC) 5 MG tablet Take 5 mg by mouth daily before breakfast.   Yes Historical Provider, MD  levothyroxine (SYNTHROID, LEVOTHROID) 125 MCG tablet Take 125 mcg by mouth daily before breakfast.   Yes Historical Provider, MD  acetaminophen (TYLENOL) 325 MG tablet Take 2 tablets (650 mg total) by mouth every 6 (six) hours as needed (or Fever >/= 101). 12/30/12   Alexzandrew Julien Girt, PA  bisacodyl (DULCOLAX) 10 MG suppository Place 1 suppository (10 mg total) rectally daily as needed. 12/30/12   Alexzandrew Julien Girt, PA  diphenhydrAMINE (BENADRYL) 12.5 MG/5ML elixir Take  5-10 mLs (12.5-25 mg total) by mouth every 4 (four) hours as needed for itching. 12/30/12   Alexzandrew Perkins, PA  docusate sodium 100 MG CAPS Take 100 mg by mouth 2 (two) times daily. 12/30/12   Alexzandrew Perkins, PA  enoxaparin (LOVENOX) 30 MG/0.3ML injection Inject 0.3 mLs (30 mg total) into the skin every 12 (twelve) hours. Continue Lovenox injections until the INR is therapeutic at or greater than 2.0.  When INR reaches the therapeutic level of equal to or greater than 2.0, the patient may discontinue the Lovenox  injections. 12/30/12   Alexzandrew Julien Girt, PA  methocarbamol (ROBAXIN) 500 MG tablet Take 1 tablet (500 mg total) by mouth every 6 (six) hours as needed. 12/30/12   Alexzandrew Perkins, PA  ondansetron (ZOFRAN) 4 MG tablet Take 1 tablet (4 mg total) by mouth every 6 (six) hours as needed for nausea. 12/30/12   Alexzandrew Perkins, PA  oxyCODONE (OXY IR/ROXICODONE) 5 MG immediate release tablet Take 1-2 tablets (5-10 mg total) by mouth every 3 (three) hours as needed. 12/30/12   Alexzandrew Perkins, PA  polyethylene glycol (MIRALAX / GLYCOLAX) packet Take 17 g by mouth daily as needed. 12/30/12   Alexzandrew Julien Girt, PA  traMADol (ULTRAM) 50 MG tablet Take 1-2 tablets (50-100 mg total) by mouth every 6 (six) hours as needed (mild pain). 12/30/12   Alexzandrew Julien Girt, PA  warfarin (COUMADIN) 5 MG tablet Take 1 tablet (5 mg total) by mouth one time only at 6 PM. Take Coumadin for 4 weeks, then discontinue.  The dose may need to be adjusted based upon the INR.  Follow the INR and titrate Coumadin dose for a therapeutic range between 2.0 and 3.0 INR.  After completing 4 weeks of Coumadin, the patient may stop the Coumadin and resume their 81 mg Aspirin daily. 12/30/12   Alexzandrew Julien Girt, PA  zolpidem (AMBIEN) 5 MG tablet Take 1 tablet (5 mg total) by mouth at bedtime as needed for sleep. 12/30/12   Alexzandrew Perkins, PA    Take Coumadin for 4 weeks and then discontinue.  The dose may need to be adjusted  based upon the INR.  Please follow the INR and titrate Coumadin dose for a therapeutic range between 2.0 and 3.0 INR.  After completing the 4 weeks of Coumadin, the patient may stop the Coumadin and resume their 81 mg Aspirin daily.  Continue Lovenox injections until the INR is therapeutic at or greater than 2.0.  When INR reaches the therapeutic level of equal to or greater than 2.0, the patient may discontinue the Lovenox injections.   Diet: Regular diet Activity:WBAT Follow-up:in 2 weeks Disposition - Rehab Discharged Condition: good   Discharge Orders    Future Appointments: Provider: Department: Dept Phone: Center:   03/02/2013 10:30 AM Newt Lukes, MD [facility] [phone] [facility]     Future Orders Please Complete By Expires   Diet general      Call MD / Call 911      Comments:   If you experience chest pain or shortness of breath, CALL 911 and be transported to the hospital emergency room.  If you develope a fever above 101 F, pus (white drainage) or increased drainage or redness at the wound, or calf pain, call your surgeon's office.   Discharge instructions      Comments:   Pick up stool softner and laxative for home. Do not submerge incision under water. May shower. Continue to use ice for pain and swelling from surgery.  Take Coumadin for 4 weeks and then discontinue.  The dose may need to be adjusted based upon the INR.  Please follow the INR and titrate Coumadin dose for a therapeutic range between 2.0 and 3.0 INR.  After completing the 4 weeks of Coumadin, the patient may stop the Coumadin and resume their 81 mg Aspirin daily. Continue Lovenox injections until the INR is therapeutic at or greater than 2.0.  When INR reaches the therapeutic level of equal to or greater than 2.0, the patient may discontinue the Lovenox injections.  When discharged from the [facility] rehab facility, please have the facility set up the patient's Home Health  Physical Therapy prior to being released.  Also provide the patient with their medications at time of release from the facility to include their pain medication, the muscle relaxants, and their blood thinner medication.  If the patient is still at the rehab facility at time of follow up appointment, please also assist the patient in arranging follow up appointment in our office and any transportation needs.   Constipation Prevention      Comments:   Drink plenty of fluids.  Prune juice may be helpful.  You may use a stool softener, such as Colace (over the counter) 100 mg twice a day.  Use MiraLax (over the counter) for constipation as needed.   Increase activity slowly as tolerated      Patient may shower      Comments:   You may shower without a dressing once there is no drainage.  Do not wash over the wound.  If drainage remains, do not shower until drainage stops.   Weight bearing as tolerated      Driving restrictions      Comments:   No driving until released by the physician.   Lifting restrictions      Comments:   No lifting until released by the physician.   TED hose      Comments:   Use stockings (TED hose) for 3 weeks on both leg(s).  You may remove them at night for sleeping.   Change dressing      Comments:   Change dressing daily with sterile 4 x 4 inch gauze dressing and apply TED hose. Do not submerge the incision under water.   Do not put a pillow under the knee. Place it under the heel.      Do not sit on low chairs, stoools or toilet seats, as it may be difficult to get up from low surfaces          Medication List     As of 12/30/2012  7:36 AM    STOP taking these medications         aspirin 81 MG tablet      Fish Oil 1200 MG Caps      multivitamin tablet      naproxen sodium 220 MG tablet   Commonly known as: ANAPROX      Vitamin D3 1000 UNITS Caps      TAKE these medications         acetaminophen 325 MG tablet   Commonly known as: TYLENOL   Take 2  tablets (650 mg total) by mouth every 6 (six) hours as needed (or Fever >/= 101).      alfuzosin 10 MG 24 hr tablet   Commonly known as: UROXATRAL   Take 10 mg by mouth every evening.      amLODipine 5 MG tablet   Commonly known as: NORVASC   Take 5 mg by mouth daily before breakfast.      bisacodyl 10 MG suppository   Commonly known as: DULCOLAX   Place 1 suppository (10 mg total) rectally daily as needed.      diphenhydrAMINE 12.5 MG/5ML elixir   Commonly known as: BENADRYL   Take 5-10 mLs (12.5-25 mg total) by mouth every 4 (four) hours as needed for itching.      DSS 100 MG Caps   Take 100 mg by mouth 2 (two) times daily.  enoxaparin 30 MG/0.3ML injection   Commonly known as: LOVENOX   Inject 0.3 mLs (30 mg total) into the skin every 12 (twelve) hours. Continue Lovenox injections until the INR is therapeutic at or greater than 2.0.  When INR reaches the therapeutic level of equal to or greater than 2.0, the patient may discontinue the Lovenox injections.      levothyroxine 125 MCG tablet   Commonly known as: SYNTHROID, LEVOTHROID   Take 125 mcg by mouth daily before breakfast.      methocarbamol 500 MG tablet   Commonly known as: ROBAXIN   Take 1 tablet (500 mg total) by mouth every 6 (six) hours as needed.      ondansetron 4 MG tablet   Commonly known as: ZOFRAN   Take 1 tablet (4 mg total) by mouth every 6 (six) hours as needed for nausea.      oxyCODONE 5 MG immediate release tablet   Commonly known as: Oxy IR/ROXICODONE   Take 1-2 tablets (5-10 mg total) by mouth every 3 (three) hours as needed.      polyethylene glycol packet   Commonly known as: MIRALAX / GLYCOLAX   Take 17 g by mouth daily as needed.      traMADol 50 MG tablet   Commonly known as: ULTRAM   Take 1-2 tablets (50-100 mg total) by mouth every 6 (six) hours as needed (mild pain).      warfarin 5 MG tablet   Commonly known as: COUMADIN   Take 1 tablet (5 mg total) by mouth one time only at  6 PM. Take Coumadin for 4 weeks, then discontinue.  The dose may need to be adjusted based upon the INR.  Follow the INR and titrate Coumadin dose for a therapeutic range between 2.0 and 3.0 INR.  After completing 4 weeks of Coumadin, the patient may stop the Coumadin and resume their 81 mg Aspirin daily.      zolpidem 5 MG tablet   Commonly known as: AMBIEN   Take 1 tablet (5 mg total) by mouth at bedtime as needed for sleep.           Follow-up Information    Follow up with Loanne Drilling, MD. Schedule an appointment as soon as possible for a visit in 2 weeks.   Contact information:   [address] [place] [address] [phone]          Signed: [name] 12/30/2012, 7:36 AM

## 2012-12-31 ENCOUNTER — Inpatient Hospital Stay (HOSPITAL_COMMUNITY): Payer: 59 | Admitting: Physical Therapy

## 2012-12-31 ENCOUNTER — Inpatient Hospital Stay (HOSPITAL_COMMUNITY): Payer: 59 | Admitting: Occupational Therapy

## 2012-12-31 DIAGNOSIS — M1710 Unilateral primary osteoarthritis, unspecified knee: Secondary | ICD-10-CM

## 2012-12-31 DIAGNOSIS — Z96659 Presence of unspecified artificial knee joint: Secondary | ICD-10-CM

## 2012-12-31 DIAGNOSIS — D62 Acute posthemorrhagic anemia: Secondary | ICD-10-CM

## 2012-12-31 LAB — COMPREHENSIVE METABOLIC PANEL
ALT: 12 U/L (ref 0–53)
AST: 17 U/L (ref 0–37)
Albumin: 2.7 g/dL — ABNORMAL LOW (ref 3.5–5.2)
Alkaline Phosphatase: 53 U/L (ref 39–117)
BUN: 13 mg/dL (ref 6–23)
CO2: 30 mEq/L (ref 19–32)
Calcium: 8.7 mg/dL (ref 8.4–10.5)
Chloride: 98 mEq/L (ref 96–112)
Creatinine, Ser: 0.81 mg/dL (ref 0.50–1.35)
GFR calc Af Amer: >90 mL/min (ref >90)
GFR calc non Af Amer: >90 mL/min (ref >90)
Glucose, Bld: 122 mg/dL — ABNORMAL HIGH (ref 70–99)
Potassium: 3.8 mEq/L (ref 3.5–5.1)
Sodium: 135 mEq/L (ref 135–145)
Total Bilirubin: 0.4 mg/dL (ref 0.3–1.2)
Total Protein: 6.1 g/dL (ref 6.0–8.3)

## 2012-12-31 LAB — CBC WITH DIFFERENTIAL/PLATELET
Basophils Absolute: 0 10*3/uL (ref 0.0–0.1)
Basophils Relative: 0 % (ref 0–1)
Eosinophils Absolute: 0.2 10*3/uL (ref 0.0–0.7)
Eosinophils Relative: 4 % (ref 0–5)
HCT: 26.9 % — ABNORMAL LOW (ref 39.0–52.0)
Hemoglobin: 9.2 g/dL — ABNORMAL LOW (ref 13.0–17.0)
Lymphocytes Relative: 18 % (ref 12–46)
Lymphs Abs: 1 10*3/uL (ref 0.7–4.0)
MCH: 29.5 pg (ref 26.0–34.0)
MCHC: 34.2 g/dL (ref 30.0–36.0)
MCV: 86.2 fL (ref 78.0–100.0)
Monocytes Absolute: 0.7 10*3/uL (ref 0.1–1.0)
Monocytes Relative: 13 % — ABNORMAL HIGH (ref 3–12)
Neutro Abs: 3.6 10*3/uL (ref 1.7–7.7)
Neutrophils Relative %: 65 % (ref 43–77)
Platelets: 173 10*3/uL (ref 150–400)
RBC: 3.12 MIL/uL — ABNORMAL LOW (ref 4.22–5.81)
RDW: 12.9 % (ref 11.5–15.5)
WBC: 5.6 10*3/uL (ref 4.0–10.5)

## 2012-12-31 LAB — PROTIME-INR
INR: 1.4 (ref 0.00–1.49)
Prothrombin Time: 16.8 seconds — ABNORMAL HIGH (ref 11.6–15.2)

## 2012-12-31 MED ORDER — WARFARIN - PHARMACIST DOSING INPATIENT
Freq: Every day | Status: DC
  Administered 2013-01-03 – 2013-01-06 (×3)

## 2012-12-31 MED ORDER — WARFARIN SODIUM 5 MG PO TABS
5.0000 mg | ORAL_TABLET | Freq: Once | ORAL | Status: AC
  Administered 2012-12-31: 5 mg via ORAL
  Filled 2012-12-31: qty 1

## 2012-12-31 NOTE — Interval H&P Note (Signed)
[name] was admitted today to Inpatient Rehabilitation with the diagnosis of OA knees, s/p bilateral TKA's.  The patient's history has been reviewed, patient examined, and there is no change in status.  Patient continues to be appropriate for intensive inpatient rehabilitation.  I have reviewed the patient's chart and labs.  Questions were answered to the patient's satisfaction.  The full H&P/evaluation was performed yesterday, 12/30/12. This document was placed in the rehab chart for the purpose of charting.  SWARTZ,ZACHARY T 12/31/2012, 8:38 AM

## 2012-12-31 NOTE — Progress Notes (Signed)
Social Work Assessment and Plan Social Work Assessment and Plan  Patient Details  Name: [name] MRN: [id number] Date of Birth: 09/08/1951  Today's Date: 12/31/2012  Problem List:  Patient Active Problem List  Diagnosis  . HYPOTHYROIDISM  . DYSLIPIDEMIA  . OBESITY  . HYPERTENSION  . ARTHRITIS  . COLONIC POLYPS, HX OF  . BPH (benign prostatic hypertrophy)  . OA (osteoarthritis) of knee  . Postop Hyponatremia  . Postop Acute blood loss anemia   Past Medical History:  Past Medical History  Diagnosis Date  . COLONIC POLYPS, HX OF 2007  . Arthritis     Knees  . OBESITY   . HYPOTHYROIDISM   . HYPERTENSION   . BPH (benign prostatic hypertrophy)    Past Surgical History:  Past Surgical History  Procedure Date  . Tonsillectomy   . Laminectomy 1991  . Arthroscopic knee surgery     (R) 2003 & (L) 2010  . Total hip arthroplasty 01/2011    R - alusio  . Tonsillectomy   . Total knee arthroplasty 12/27/2012    Procedure: TOTAL KNEE BILATERAL;  Surgeon: Loanne Drilling, MD;  Location: [facility];  Service: Orthopedics;  Laterality: Bilateral;   Social History:  reports that he has never smoked. He has never used smokeless tobacco. He reports that he drinks alcohol. He reports that he does not use illicit drugs.  Family / Support Systems Marital Status: Married Patient Roles: Spouse;Parent;Other (Comment) (Employee) Spouse/Significant Other: [name] [phone]  [phone] Children: Bonita Quin Fantroy-daughter  [phone] Anticipated Caregiver: Wife and daughter to assist if necessary Ability/Limitations of Caregiver: Wife works 7p-7a at [employer] Caregiver Availability: Other (Comment) (Wife works 7p-7a, may have daughter come then if necessary) Family Dynamics: Close knit fmaily who depend upon one another.  Pt is hopeful he will be independent so will not need daughter to assist.  Biggest issue is steps.  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  He is trying to work thorugh the pain so he can improve.  he had hip surgery a few years ago and this is much worse than that. Recent Psychosocial Issues: Other medical issues Pyschiatric History: No issues-deferred depression screen due to pt felt not necessary and will continue to monitor his coping while here. Substance Abuse History: No issues  Patient / Family Perceptions, Expectations & Goals Pt/Family understanding of illness & functional limitations: Pt and wife can explain his surgery and limitations.  Wife is an Charity fundraiser at [employer].  His main issue is the steps to the second floor where their bedroom and full bathroom are. Premorbid pt/family roles/activities: Husband, father, Home owner, Employee, [employer] member, etc Anticipated changes in roles/activities/participation: Resume Pt/family expectations/goals: Pt states: " I want to be able to do for myself, once I leave here."  " I know I have a ways to go."  Wife states: " I will do what I can to help him, but he is very independent and wants to do  for himself."  [employer]: None Premorbid Home Care/DME Agencies: Other (Comment) (Has DME from hip surgery 2 years ago) Transportation available at discharge: Family  Discharge Planning Living Arrangements:  Spouse/significant other Support Systems: Spouse/significant other;Children;Other relatives;Friends/neighbors;Church/faith community Type of Residence: Private residence Insurance Resources: Media planner (specify) Horticulturist, commercial) Financial Resources: Employment;Family Support Financial Screen Referred: No Living Expenses: Lives with family Money Management: Spouse;Patient Do you have any problems obtaining your medications?: No Home Management: Both Patient/Family Preliminary Plans: Return home with wife who can assist during the day.  Works nights, daughter can assist if needed.  Pt should do well here. Barriers to Discharge: Steps Social Work Anticipated Follow Up Needs: HH/OP  Clinical Impression Pleasant gentleman who is in pain from just finishing therapies.  He is trying to work through the pain.  Supportive wife and daughter who will assist if needed.  Stairs to second floor will be pt's biggest barrier. Await team's evaluation.  Lucy Chris 12/31/2012, 1:39 PM

## 2012-12-31 NOTE — Progress Notes (Signed)
Physical Therapy Session Note  Patient Details  Name: [name] MRN: [id number] Date of Birth: 09-06-1951  Today's Date: 12/31/2012 Time: 1115-1210 Time Calculation (min): 55 min  Skilled Therapeutic Interventions/Progress Updates:  Supervision for supine/sit transfer; mod A for sit/stand from EOB to RW.  Ambulation with RW and min A 10' x2 to perform toileting at commode in bathroom.  Dynamic standing balance at sink with SBA 1 min.  WC propulsion 150' with supervision in controlled environment for increased endurance.  Sit/stand from [facility] to RW with mod A; min A for stand pivot to mat table.    Supervision for sit/supine onto mat table in supine.  Patient performed LE strengthening bilaterally:  Ankle pumps x20, quad sets, SAQ (assisted), hip a/a, all 10x each; heel slides AROM x5; AAROM x5.  Supervision for supine/sit transfer to edge of mat table.  Mod A for sit/stand to RW from elevated mat table; min A for stand pivot back to WC.  Patient wheeled back to room in [facility] where he performed stand pivot from [facility] to EOB with min A.  Supervision for sit/supine and scooting up in bed with head lowered and use of bedrails.  Educated patient in edema management through use of ice packs, elevating bilateral LEs at rest, and ankle pumps throughout the day.  Patient positioned in bed with immobilizers removed, legs elevated with pillows under ankles and calves, ice packs on knees, call button in reach.  Therapy Documentation Precautions:  Precautions Precautions: Knee;Fall Required Braces or Orthoses: Knee Immobilizer - Right;Knee Immobilizer - Left Knee Immobilizer - Right: Discontinue once straight leg raise with < 10 degree lag Knee Immobilizer - Left: Discontinue once straight leg raise with < 10 degree lag Restrictions Weight Bearing Restrictions: Yes LLE Weight Bearing: Weight bearing as tolerated Other Position/Activity Restrictions: WBAT  Pain: Pain Assessment Pain Assessment: 0-10 Pain  Score:   3 Pain Type: Surgical pain Pain Location: Knee Pain Orientation: Left;Right Pain Descriptors: Aching Pain Onset: On-going Pain Intervention(s): Cold applied Multiple Pain Sites: No  See FIM for current functional status  Therapy/Group: Individual Therapy  Rexene Agent 12/31/2012, 12:18 PM

## 2012-12-31 NOTE — Evaluation (Signed)
Occupational Therapy Assessment and Plan & Session Notes  Patient Details  Name: [name] MRN: [id number] Date of Birth: [birth year]  OT Diagnosis: acute pain and muscle weakness (generalized) Rehab Potential: Rehab Potential: Excellent ELOS: 7-10 days   Today's Date: 12/31/2012  Problem List:  Patient Active Problem List  Diagnosis  . HYPOTHYROIDISM  . DYSLIPIDEMIA  . OBESITY  . HYPERTENSION  . ARTHRITIS  . COLONIC POLYPS, HX OF  . BPH (benign prostatic hypertrophy)  . OA (osteoarthritis) of knee  . Postop Hyponatremia  . Postop Acute blood loss anemia    Past Medical History:  Past Medical History  Diagnosis Date  . COLONIC POLYPS, HX OF 2007  . Arthritis     Knees  . OBESITY   . HYPOTHYROIDISM   . HYPERTENSION   . BPH (benign prostatic hypertrophy)    Past Surgical History:  Past Surgical History  Procedure Date  . Tonsillectomy   . Laminectomy 1991  . Arthroscopic knee surgery     (R) 2003 & (L) 2010  . Total hip arthroplasty 01/2011    R - alusio  . Tonsillectomy   . Total knee arthroplasty 12/27/2012    Procedure: TOTAL KNEE BILATERAL;  Surgeon: Loanne Drilling, MD;  Location: [facility];  Service: Orthopedics;  Laterality: Bilateral;    Assessment & Plan Clinical Impression: [name] is a 62 y.o. male with history of obesity, HTN, bilateral knee pain due to DJD and failure of conservative therapy. Patient elected to undergo B-TKR on 12/27/12 by Dr. Despina Hick. Post op WBAT with CPM for PROM and on lovenox/coumadin bridge for DVT prophylaxis. ABLA noted with hgb down to 9.3 today. Mild hyponatremia likely dilutional due to IVF. Patient transferred to CIR on 12/30/2012 .    Patient currently requires min-total assist with basic self-care skills and IADL secondary to muscle weakness and muscle joint tightness and decreased sitting balance, decreased standing balance, decreased postural control and decreased balance strategies.  Prior to hospitalization, patient  could complete ADLs and IADLs independently.   Patient will benefit from skilled intervention to increase independence with basic self-care skills prior to discharge home with care partner.  Anticipate patient will require intermittent supervision and no further OT follow recommended.  OT - End of Session Activity Tolerance: Tolerates 10 - 20 min activity with multiple rests Endurance Deficit: Yes OT Assessment Rehab Potential: Excellent Barriers to Discharge: Inaccessible home environment Barriers to Discharge Comments: patient with ~4 stairs to enter house and 13 to get to 2nd level of house OT Plan OT Intensity: Minimum of 1-2 x/day, 45 to 90 minutes OT Frequency: 5 out of 7 days OT Duration/Estimated Length of Stay: 7-10 days OT Treatment/Interventions: Balance/vestibular training;Community reintegration;Discharge planning;DME/adaptive equipment instruction;Functional mobility training;Neuromuscular re-education;Patient/family education;Pain management;Psychosocial support;Self Care/advanced ADL retraining;Skin care/wound managment;Splinting/orthotics;Therapeutic Activities;Therapeutic Exercise;UE/LE Strength taining/ROM;UE/LE Coordination activities;Wheelchair propulsion/positioning OT Recommendation Patient destination: Home Follow Up Recommendations: None Equipment Recommended: 3 in 1 bedside comode;Tub/shower seat  Precautions/Restrictions  Precautions Precautions: Knee;Fall Required Braces or Orthoses: Knee Immobilizer - Right;Knee Immobilizer - Left Knee Immobilizer - Right: Discontinue once straight leg raise with < 10 degree lag Knee Immobilizer - Left: Discontinue once straight leg raise with < 10 degree lag Restrictions Weight Bearing Restrictions: Yes Other Position/Activity Restrictions: WBAT  General Chart Reviewed: Yes Family/Caregiver Present: No  Vital Signs Therapy Vitals Temp: 98.2 F (36.8 C) Temp src: Oral Pulse Rate: 86  Resp: 19  BP: 139/74  mmHg Patient Position, if appropriate: Lying Oxygen Therapy SpO2: 91 %  O2 Device: None (Room air)  Pain Pain Assessment Pain Assessment: 0-10 Pain Score:   1 Pain Type: Acute pain;Surgical pain Pain Location: Knee Pain Orientation:  (bilateral knees) Pain Radiating Towards:  (N/A) Pain Descriptors: Aching;Tightness Pain Frequency: Intermittent Pain Onset: On-going Patients Stated Pain Goal: 0 Pain Intervention(s): Refused Multiple Pain Sites: No  Home Living/Prior Functioning Home Living Lives With: Spouse Available Help at Discharge: Family Type of Home: House Home Access: Stairs to enter Secretary/administrator of Steps: 3-4 (garage entrance with no rail), 6-7 (front entrance with rail) Entrance Stairs-Rails: None Home Layout: Two level Alternate Level Stairs-Number of Steps: 13 Alternate Level Stairs-Rails: Left Bathroom Shower/Tub: Walk-in shower;Door [employer] Toilet: Handicapped height Bathroom Accessibility: Yes How Accessible: Accessible via walker Home Adaptive Equipment: None IADL History Homemaking Responsibilities: Yes Meal Prep Responsibility: Primary Laundry Responsibility: Primary Cleaning Responsibility: Primary Bill Paying/Finance Responsibility: Primary Shopping Responsibility: Primary Homemaking Comments: yard work as well Current License: Yes Occupation: Part time employment 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, physiological at [employer] 5:30-9:30 Leisure and Hobbies: spending time with grandkids, swim Prior Function Level of Independence: Independent with basic ADLs;Independent with homemaking with ambulation;Independent with gait;Independent with transfers Able to Take Stairs?: Yes Driving: Yes  ADL - See FIM  Vision/Perception  Vision - History Baseline Vision: Wears glasses all the time Patient Visual Report: No change from baseline Vision - Assessment Eye Alignment: Within Functional Limits Vision Assessment: Vision not  tested Perception Perception: Within Functional Limits Praxis Praxis: Intact   Cognition Overall Cognitive Status: Appears within functional limits for tasks assessed Arousal/Alertness: Awake/alert Orientation Level: Oriented X4 Memory: Appears intact Awareness: Appears intact Problem Solving: Appears intact Safety/Judgment: Appears intact  Sensation Sensation Additional Comments: bilateral UEs appear intact Coordination Gross Motor Movements are Fluid and Coordinated: Yes Fine Motor Movements are Fluid and Coordinated: Yes  Motor - See Evaluation Navigator  Mobility - See Evaluation Navigator  Trunk/Postural Assessment - See Evaluation Navigator  Balance- See Evaluation Navigator  Extremity/Trunk Assessment RUE Assessment RUE Assessment: Within Functional Limits (can benefit from strengthening) LUE Assessment LUE Assessment: Within Functional Limits (can benefit from strengthening)  See FIM for current functional status Refer to Care Plan for Long Term Goals  Recommendations for other services: None  Discharge Criteria: Patient will be discharged from OT if patient refuses treatment 3 consecutive times without medical reason, if treatment goals not met, if there is a change in medical status, if patient makes no progress towards goals or if patient is discharged from hospital.  The above assessment, treatment plan, treatment alternatives and goals were discussed and mutually agreed upon: by patient  ----------------------------------------------------------------------------------------------------------------------------  SESSION NOTES  Session #1 [id number] - 55 Minutes Individual Therapy Beginning of session patient with 1/10 complaints of pain and no medication needed.  Initial 1:1 occupational therapy evaluation completed. Focused skilled intervention on donning of bilateral knee immobilizers, bed mobility, sit->stands, functional mobility/ambulation  throughout room using rolling walker, shower stall transfer on/off shower seat, UB/LB bathing at shower level, UB/LB dressing, grooming tasks seated at sink in bed side chair, and overall activity tolerance/endurance. Patient left seated at sink in bed side chair with call bell & phone within reach.   Session #2 [id number] - 35 Minutes Individual Therapy No complaints of pain Patient found supine in bed. Engaged in bed mobility for functional mobility in room using rolling walker for toilet transfer, standing during toileting. Patient then performed grooming task of washing hands at sink and then ambulated from room -> chairs beside [employer].  Therapist then propelled patient -> ADL apartment for simulated tub/shower transfers using simulated shower block. Patient stepped over block frontwards and backwards ~4 times each. Patient then sat on bed for seated rest break. Then got into w/c and therapist propelled patient back to room. Patient requested to get back to bed. Therapist assisted and helped position patient in bed. Left patient with bilateral ankles propped off bed per order and call bell & phone within reach.   CLAY,PATRICIA 12/31/2012, 9:37 AM

## 2012-12-31 NOTE — Progress Notes (Signed)
Subjective/Complaints: Had a good night except for getting her late. Pain controlled. Still no bm  A 12 point review of systems has been performed and if not noted above is otherwise negative.   Objective: Vital Signs: Blood pressure 139/74, pulse 86, temperature 98.2 F (36.8 C), temperature source Oral, resp. rate 19, height 6' (1.829 m), weight 127.1 kg (280 lb 3.3 oz), SpO2 91.00%. No results found.  Basename 12/31/12 0630 12/30/12 0410  WBC 5.6 7.3  HGB 9.2* 9.3*  HCT 26.9* 26.8*  PLT 173 147*    Basename 12/31/12 0630 12/29/12 0442  NA 135 132*  K 3.8 3.8  CL 98 99  GLUCOSE 122* 134*  BUN 13 8  CREATININE 0.81 0.75  CALCIUM 8.7 8.4   CBG (last 3)  No results found for this basename: GLUCAP:3 in the last 72 hours  Wt Readings from Last 3 Encounters:  12/30/12 127.1 kg (280 lb 3.3 oz)  12/27/12 124.286 kg (274 lb)  12/27/12 124.286 kg (274 lb)    Physical Exam:  Nursing note and vitals reviewed.  Constitutional: He is oriented to person, place, and time. He appears well-developed and well-nourished.  HENT:  Head: Normocephalic and atraumatic.  Right Ear: External ear normal.  Left Ear: External ear normal.  Eyes: Conjunctivae normal and EOM are normal. Pupils are equal, round, and reactive to light.  Neck: Normal range of motion. Neck supple. No JVD present. No tracheal deviation present. No thyromegaly present.  Cardiovascular: Normal rate, regular rhythm and normal heart sounds.  No murmur heard.  Pulmonary/Chest: Effort normal and breath sounds normal. No respiratory distress. He has no wheezes.  Abdominal: Soft. Bowel sounds are normal. He exhibits no distension. There is no tenderness.  Musculoskeletal: He exhibits edema and tenderness (with ROM bilateral knees. ).  Knee incisions clean and intact with no drainage. Legs are appropriately tender  Lymphadenopathy:  He has no cervical adenopathy.  Neurological: He is alert and oriented to person, place,  and time. No cranial nerve deficit. Coordination normal.  Skin: Skin is warm and dry.  . Moderate edema bilateral knees down to foot. Minimal warmth RLE.  Psychiatric: He has a normal mood and affect. His behavior is normal. Judgment and thought content normal   Assessment/Plan: 1. Functional deficits secondary to endstage bilateral knee OA s/p bilateral TKA's which require 3+ hours per day of interdisciplinary therapy in a comprehensive inpatient rehab setting. Physiatrist is providing close team supervision and 24 hour management of active medical problems listed below. Physiatrist and rehab team continue to assess barriers to discharge/monitor patient progress toward functional and medical goals. FIM:                   Comprehension Comprehension Mode: Auditory Comprehension: 7-Follows complex conversation/direction: With no assist  Expression Expression Mode: Verbal Expression: 7-Expresses complex ideas: With no assist  Social Interaction Social Interaction: 7-Interacts appropriately with others - No medications needed.  Problem Solving Problem Solving: 7-Solves complex problems: Recognizes & self-corrects  Memory Memory: 7-Complete Independence: No helper  Medical Problem List and Plan:  1. DVT Prophylaxis/Anticoagulation: Pharmaceutical: Coumadin and Lovenox  2. Pain Management: reports reasonable with prn meds.  3. Mood: Positive. LCSW to follow for formal evaluation.  4. Neuropsych: This patient is capable of making decisions on his/her own behalf.  5. ABLA: Recheck in am. Will add iron supplement.  6. Hypothyroid: continue supplement  7. HTN: Monitor with bid checks. Continue Norvasc.  8. Chronic constipation: On stool softeners without  any results so far. Now on miralax daily. Suppository prn. Enema if needed as well 9. BPH: No voiding difficulties reported. Continue alfuzosin  LOS (Days) 1 A FACE TO FACE EVALUATION WAS PERFORMED  SWARTZ,ZACHARY  T 12/31/2012 8:39 AM

## 2012-12-31 NOTE — Progress Notes (Signed)
Orthopedic Tech Progress Note Patient Details:  [name] 1951-03-22 [id number]  CPM Left Knee CPM Left Knee: Off Additional Comments: applied overhead frame  CPM Right Knee CPM Right Knee: On Right Knee Flexion (Degrees): 40  Right Knee Extension (Degrees): 0    [name] 12/31/2012, 8:17 PM

## 2012-12-31 NOTE — Progress Notes (Signed)
Patient information reviewed and entered into eRehab system by Marie Noel, RN, CRRN, PPS Coordinator.  Information including medical coding and functional independence measure will be reviewed and updated through discharge.    

## 2012-12-31 NOTE — Care Management Note (Signed)
[facility] Individual Statement of Services  Patient Name:  [name]  Date:  12/31/2012  Welcome to the [facility].  Our goal is to provide you with an individualized program based on your diagnosis and situation, designed to meet your specific needs.  With this comprehensive rehabilitation program, you will be expected to participate in at least 3 hours of rehabilitation therapies Monday-Friday, with modified therapy programming on the weekends.  Your rehabilitation program will include the following services:  Physical Therapy (PT), Occupational Therapy (OT), 24 hour per day rehabilitation nursing, Case Management (RN and Social Worker), Rehabilitation Medicine, Nutrition Services and Pharmacy Services  Weekly team conferences will be held on Tuesday to discuss your progress.  Your RN Case Designer, television/film set will talk with you frequently to get your input and to update you on team discussions.  Team conferences with you and your family in attendance may also be held.  Expected length of stay: 7-10 days Overall anticipated outcome: mod/i level  Depending on your progress and recovery, your program may change.  Your RN Case Estate agent will coordinate services and will keep you informed of any changes.  Your RN Sports coach and SW names and contact numbers are listed  below.  The following services may also be recommended but are not provided by the [facility]:   Driving Evaluations  Home Health Rehabiltiation Services  Outpatient Rehabilitatation [facility]  Vocational Rehabilitation   Arrangements will be made to provide these services after discharge if needed.  Arrangements include referral to agencies that provide these services.  Your insurance has been verified to be:  [facility] Your primary doctor is:  Dr Rene Paci  Pertinent information will be shared with your doctor and your insurance  company.   Social Worker:  Dossie Der, [place] [phone]  Information discussed with and copy given to patient by: Lucy Chris, 12/31/2012, 9:57 AM

## 2012-12-31 NOTE — Evaluation (Signed)
Physical Therapy Assessment and Plan  Patient Details  Name: [name] MRN: [id number] Date of Birth: [birth year]  PT Diagnosis: Abnormality of gait, Difficulty walking, Muscle weakness, Osteoarthritis and Pain in joint Rehab Potential: Good ELOS: 10 days   Today's Date: 12/31/2012 Time: 1027-2536 Time Calculation (min): 52 min  Problem List:  Patient Active Problem List  Diagnosis  . HYPOTHYROIDISM  . DYSLIPIDEMIA  . OBESITY  . HYPERTENSION  . ARTHRITIS  . COLONIC POLYPS, HX OF  . BPH (benign prostatic hypertrophy)  . OA (osteoarthritis) of knee  . Postop Hyponatremia  . Postop Acute blood loss anemia    Past Medical History:  Past Medical History  Diagnosis Date  . COLONIC POLYPS, HX OF 2007  . Arthritis     Knees  . OBESITY   . HYPOTHYROIDISM   . HYPERTENSION   . BPH (benign prostatic hypertrophy)    Past Surgical History:  Past Surgical History  Procedure Date  . Tonsillectomy   . Laminectomy 1991  . Arthroscopic knee surgery     (R) 2003 & (L) 2010  . Total hip arthroplasty 01/2011    R - alusio  . Tonsillectomy   . Total knee arthroplasty 12/27/2012    Procedure: TOTAL KNEE BILATERAL;  Surgeon: Loanne Drilling, MD;  Location: [facility];  Service: Orthopedics;  Laterality: Bilateral;    Assessment & Plan Clinical Impression: [name] is a 62 y.o. male with history of obesity, HTN, bilateral knee pain due to DJD and failure of conservative therapy. Patient elected to undergo B-TKR on 12/27/12 by Dr. Despina Hick. Post op WBAT, bil knee immobilizers (d/c after SLR) with CPM for PROM.  Patient transferred to CIR on 12/30/2012 .   Patient currently requires mod with mobility secondary to muscle weakness and muscle joint tightness.  Prior to hospitalization, patient was independent, working part time  with mobility and lived with Spouse in a House home.  Home access is 3-4 (garage entrance with no rail), 6-7 (front entrance with rail) bil can't reach bothStairs to  enter.  Patient will benefit from skilled PT intervention to maximize safe functional mobility, minimize fall risk and decrease caregiver burden for planned discharge home with 24 hour assist.  Anticipate patient will benefit from follow up [facility] at discharge.  PT - End of Session Activity Tolerance: Tolerates 30+ min activity with multiple rests Endurance Deficit: Yes Endurance Deficit Description: limited by pain and strength PT Assessment Rehab Potential: Good Barriers to Discharge: Inaccessible home environment Barriers to Discharge Comments: has a flight of stairs to get up to his bedroom/bath PT Plan PT Intensity: Minimum of 2-3x/day, 45 to 90 minutes PT Frequency: 5 out of 7 days PT Duration Estimated Length of Stay: 10 days PT Treatment/Interventions: Ambulation/gait training;Balance/vestibular training;Community reintegration;Discharge planning;DME/adaptive equipment instruction;Functional mobility training;Neuromuscular re-education;Pain management;Patient/family education;Psychosocial support;Skin care/wound management;Splinting/orthotics;Stair training;Therapeutic Activities;Therapeutic Exercise;UE/LE Strength taining/ROM;UE/LE Coordination activities;Wheelchair propulsion/positioning PT Recommendation Follow Up Recommendations: Home health PT;24 hour supervision/assistance Patient destination: Home Equipment Recommended: None recommended by PT Equipment Details: pt says he can borrow a RW, he already has crutches and I am hoping not to send him home with a WC, if so it will be for community access and it will be a rental 18x18 standard cushion elevating leg rests)  PT Evaluation Precautions/Restrictions Precautions Precautions: Knee;Fall Required Braces or Orthoses: Knee Immobilizer - Right;Knee Immobilizer - Left Knee Immobilizer - Right: Discontinue once straight leg raise with < 10 degree lag Knee Immobilizer - Left: Discontinue once straight leg  raise with < 10 degree  lag Restrictions Weight Bearing Restrictions: Yes LLE Weight Bearing: Weight bearing as tolerated Other Position/Activity Restrictions: WBAT General   Vital Signs  Pain Pain Assessment Pain Assessment: 0-10 Pain Score:   3 Pain Type: Surgical pain Pain Location: Knee Pain Orientation: Right;Left Pain Radiating Towards:  (N/A) Pain Descriptors: Aching;Tightness Pain Frequency: Intermittent Pain Onset: On-going Patients Stated Pain Goal: 0 Pain Intervention(s): Repositioned;Ambulation/increased activity (pre medicated this AM) Multiple Pain Sites: No Home Living/Prior Functioning Home Living Lives With: Spouse Available Help at Discharge: Family;Available 24 hours/day (wife works nights 3 days a week, daughter available ) Type of Home: House Home Access: Stairs to enter [employer] of Steps: 3-4 (garage entrance with no rail), 6-7 (front entrance with rail) bil can't reach both Entrance Stairs-Rails: None Home Layout: Two level;1/2 bath on main level Alternate Level Stairs-Number of Steps: 13 Alternate Level Stairs-Rails: Left Bathroom Shower/Tub: Walk-in shower;Door [employer] Toilet: Handicapped height Bathroom Accessibility: Yes How Accessible: Accessible via walker Home Adaptive Equipment: Grab bars in shower;Straight cane;Walker - rolling;Crutches;Reacher;Long-handled shoehorn Additional Comments: h/o laminectomy years ago, some residual stiffness, R THA  2 years ago.   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   Leisure: Hobbies-yes (Comment) Comments: help with grandkids, work in the yard, likes to work on [employer], used to golf, has a Nurse, mental health (Teacher, adult education).   Vision/Perception   Vision - History Baseline Vision: Wears glasses all the time Patient Visual Report: No change from baseline Vision - Assessment Eye Alignment: Within Functional Limits Vision Assessment: Vision not tested Perception Perception: Within Functional Limits Praxis Praxis: Intact  Cognition Overall Cognitive Status: Appears within functional limits for tasks assessed Arousal/Alertness: Awake/alert Orientation Level: Oriented X4 Memory: Appears intact Awareness: Appears intact Problem Solving: Appears intact Safety/Judgment: Appears intact Sensation Sensation Additional Comments: bilateral UEs appear intact Coordination Gross Motor Movements are Fluid and Coordinated: Yes Fine Motor Movements are Fluid and Coordinated: Yes Motor     Mobility Bed Mobility Supine to Sit: 5: Supervision;With rails;HOB flat Sit to Supine: 5: Supervision;HOB flat;With rail Sit to Supine - Details (indicate cue type and reason): heavy reliance on rails to get into and out of bed as well as scoot up into the bed' Transfers Sit to Stand: 3: Mod assist;With upper extremity assist;From bed;From chair/3-in-1;With armrests;From elevated surface Stand to Sit: With upper extremity assist;To elevated surface;To bed;To chair/3-in-1;With armrests;3: Mod assist Locomotion  Ambulation Ambulation/Gait Assistance: 4: Min assist Ambulation Distance (Feet): 20 Feet Assistive device: Rolling walker Ambulation/Gait Assistance Details: cues for upright posture Gait Gait Pattern: Step-to pattern;Shuffle;Antalgic;Trunk flexed Corporate treasurer: Yes Wheelchair Assistance: 5: Financial planner Details: Verbal cues for technique;Verbal cues for precautions/safety;Verbal cues for Copy: Both upper extremities Wheelchair Parts Management: Needs assistance Distance: 150  Trunk/Postural Counsellor Sitting - Balance  Support: Bilateral upper extremity supported;Feet supported Static Sitting - Level of Assistance: 5: Stand by assistance Extremity Assessment  RUE Assessment RUE Assessment: Within Functional Limits (can benefit from strengthening) LUE Assessment LUE Assessment: Within Functional Limits (can benefit from strengthening) RLE Assessment RLE Assessment: Exceptions to [facility] RLE Strength RLE Overall Strength Comments: ankle PF/DF 5/5, knee flexion 2/5 within available ROM, hip flexion 3-/5, abudtion 2+/5, leg edema seems to  be limiting his ROM.   LLE Assessment LLE Assessment: Exceptions to [facility] LLE Strength LLE Overall Strength Comments: ankle PF/DF 5/5, knee flexion 2/5 within available ROM, hip flexion 3-/5, abudtion 2+/5, leg edema seems to be limiting his ROM.    FIM:  FIM - Banker Devices: Walker;Bed rails;Arm rests Bed/Chair Transfer: 5: Supine > Sit: Supervision (verbal cues/safety issues);5: Sit > Supine: Supervision (verbal cues/safety issues);3: Bed > Chair or W/C: Mod A (lift or lower assist);3: Chair or W/C > Bed: Mod A (lift or lower assist) FIM - Locomotion: Wheelchair Distance: 150 Locomotion: Wheelchair: 5: Travels 150 ft or more: maneuvers on rugs and over door sills with supervision, cueing or coaxing FIM - Locomotion: Ambulation Locomotion: Ambulation Assistive Devices: Designer, industrial/product Ambulation/Gait Assistance: 4: Min assist Locomotion: Ambulation: 1: Travels less than 50 ft with minimal assistance (Pt.>75%) FIM - Locomotion: Stairs Locomotion: Stairs: 0: Activity did not occur   Refer to Care Plan for Long Term Goals  Recommendations for other services: None  Discharge Criteria: Patient will be discharged from PT if patient refuses treatment 3 consecutive times without medical reason, if treatment goals not met, if there is a change in medical status, if patient makes no progress towards goals or if patient is discharged from  hospital.  The above assessment, treatment plan, treatment alternatives and goals were discussed and mutually agreed upon: by patient  Lurena Joiner B. Medendorp, PT, DPT [phone]   12/31/2012, 11:11 AM

## 2012-12-31 NOTE — Plan of Care (Signed)
Overall Plan of Care [facility]) Patient Details Name: [name] MRN: [id number] DOB: 11/14/1951  Diagnosis:  Severe oa of the knees, s/p bilateral TKA's  Primary Diagnosis:    OA (osteoarthritis) of knee Co-morbidities: ABLA pain,   Functional Problem List  Patient demonstrates impairments in the following areas: Balance, Endurance and Pain  Basic ADL's: grooming, bathing, dressing and toileting Advanced ADL's: simple meal preparation  Transfers:  bed mobility, bed to chair, toilet, tub/shower, car and furniture Locomotion:  ambulation and stairs  Additional Impairments:  Leisure Awareness  Anticipated Outcomes Item Anticipated Outcome  Eating/Swallowing  Mod i  Basic self-care  Mod I  Tolieting  Mod I  Bowel/Bladder  Bowel-minimal assistance Bladder-independent  Transfers  Mod I  Locomotion  Mod I  Communication    Cognition    Pain  Less than 3  Safety/Judgment  independent  Other     Therapy Plan: PT Intensity: Minimum of 2-3x/day, 45 to 90 minutes PT Frequency: 5 out of 7 days PT Duration Estimated Length of Stay: 10 days OT Intensity: Minimum of 1-2 x/day, 45 to 90 minutes OT Frequency: 5 out of 7 days OT Duration/Estimated Length of Stay: 7-10 days      Team Interventions: Item RN PT OT SLP SW TR Other  Self Care/Advanced ADL Retraining  x x      Neuromuscular Re-Education  x x      Therapeutic Activities  x x      UE/LE Strength Training/ROM  x x      UE/LE Coordination Activities  x x      Visual/Perceptual Remediation/Compensation         DME/Adaptive Equipment Instruction  x x      Therapeutic Exercise  x x      Balance/Vestibular Training  x x      Patient/Family Education  x x      Cognitive Remediation/Compensation         Functional Mobility Training  x x      Ambulation/Gait Training  x x      Stair Training  x       Wheelchair Propulsion/Positioning  x x      Functional Tourist information centre manager Reintegration   x x      Dysphagia/Aspiration Film/video editor         Bladder Management         Bowel Management x        Disease Management/Prevention         Pain Management x x x      Medication Management x        Skin Care/Wound Management x x x      Splinting/Orthotics  x x      Discharge Planning  x x      Psychosocial Support  x x                             Team Discharge Planning: Destination: PT-Home ,OT- Home , SLP-  Projected Follow-up: PT-Home health PT;24 hour supervision/assistance, OT-  None, SLP-  Projected Equipment Needs: PT-None recommended by PT, OT- 3 in 1 bedside comode;Tub/shower seat, SLP-  Patient/family involved in discharge planning: PT- Patient,  OT-Patient, SLP-   MD ELOS: 7-10 days Medical Rehab Prognosis:  Excellent Assessment: The patient has been admitted for CIR  therapies. The team will be addressing, functional mobility, strength, stamina, balance, safety, adaptive techniques/equipment, self-care, bowel and bladder mgt, patient and caregiver education, knee ROM, pain control, wound care. Goals have been set at mod I in general. May need a little help with bowel mgt.     See Team Conference Notes for weekly updates to the plan of care

## 2012-12-31 NOTE — Progress Notes (Signed)
ANTICOAGULATION CONSULT NOTE - Follow Up Consult  Pharmacy Consult for Warfarin Indication: VTE prophylaxis s/p bilateral TKA  No Known Allergies  Patient Measurements: Height: 6' (182.9 cm) Weight: 280 lb 3.3 oz (127.1 kg) IBW/kg (Calculated) : 77.6   Vital Signs: Temp: 98.2 F (36.8 C) (01/03 0610) Temp src: Oral (01/03 0610) BP: 139/74 mmHg (01/03 0610) Pulse Rate: 86  (01/03 0610)  Labs:  Basename 12/31/12 0630 12/30/12 0410 12/29/12 0442  HGB 9.2* 9.3* --  HCT 26.9* 26.8* 29.5*  PLT 173 147* 146*  APTT -- -- --  LABPROT 16.8* 15.3* 14.1  INR 1.40 1.23 1.10  HEPARINUNFRC -- -- --  CREATININE 0.81 -- 0.75  CKTOTAL -- -- --  CKMB -- -- --  TROPONINI -- -- --   Estimated Creatinine Clearance: 135.3 ml/min (by C-G formula based on Cr of 0.81).  Medications:  Scheduled:     . alfuzosin  10 mg Oral QHS  . amLODipine  5 mg Oral QAC breakfast  . enoxaparin (LOVENOX) injection  30 mg Subcutaneous Custom  . iron polysaccharides  150 mg Oral q12n4p  . levothyroxine  125 mcg Oral QAC breakfast  . polyethylene glycol  17 g Oral Daily  . warfarin  5 mg Oral ONCE-1800  . Warfarin - Pharmacist Dosing Inpatient   Does not apply q1800  . [DISCONTINUED] warfarin  5 mg Oral ONCE-1800    Assessment:  6 yoM with osteoarthritis s/p bilateral TKA 12/30.   Coumadin for post-op VTE prophylaxis x 4 weeks, then resume aspirin 81mg  daily.  INR 1.4 today   Lovenox 30mg  SQ q12   Goal of Therapy:  INR 2-3   Plan:   Repeat Coumadin 5mg  today at 6pm  Continue Lovenox 30mg  SQ q12h until INR therapeutic  Continue daily PT/INR  Celedonio Miyamoto, PharmD, BCPS Clinical Pharmacist Pager [phone]

## 2012-12-31 NOTE — H&P (View-Only) (Signed)
Physical Medicine and Rehabilitation Admission H&P    CC: Endstage DJD bilateral knees  HPI:  [name] is a 62 y.o. male with history of obesity, HTN, bilateral knee pain due to DJD and failure of conservative therapy. Patient elected to undergo B-TKR on 12/27/12 by Dr. Alusio. Post op WBAT with CPM for PROM and on lovenox/coumadin bridge for DVT prophylaxis. ABLA noted with hgb down to 9.3 today. Mild hyponatremia likely dilutional due to IVF. Therapies are ongoing and team recommending CIR. Patient admitted for progressive therapy.   Review of Systems  HENT: Negative for hearing loss.   Eyes: Negative for blurred vision and double vision.  Respiratory: Positive for cough. Negative for sputum production and shortness of breath.   Cardiovascular: Negative for chest pain and palpitations.  Gastrointestinal: Positive for constipation. Negative for heartburn, nausea and abdominal pain.  Genitourinary: Negative for urgency and frequency.  Musculoskeletal: Positive for joint pain. Negative for myalgias and back pain.  Neurological: Negative for sensory change, speech change and headaches.  Psychiatric/Behavioral: The patient has insomnia. The patient is not nervous/anxious.    Past Medical History  Diagnosis Date  . COLONIC POLYPS, HX OF 2007  . Arthritis     Knees  . OBESITY   . HYPOTHYROIDISM   . HYPERTENSION   . BPH (benign prostatic hypertrophy)    Past Surgical History  Procedure Date  . Tonsillectomy   . Laminectomy 1991  . Arthroscopic knee surgery     (R) 2003 & (L) 2010  . Total hip arthroplasty 01/2011    R - alusio  . Tonsillectomy   . Total knee arthroplasty 12/27/2012    Procedure: TOTAL KNEE BILATERAL;  Surgeon: Frank V Aluisio, MD;  Location: [facility];  Service: Orthopedics;  Laterality: Bilateral;   Family History  Problem Relation Age of Onset  . Heart disease Father   . Arthritis Other     Parent & grandparents  . Diabetes Other     parents  .  Hypertension Other     parents   Social History: Married. 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 Wife is RN at [employer].  He reports that he has never smoked. He has never used smokeless tobacco. He reports that he drinks alcohol. He reports that he does not use illicit drugs.   Allergies: No Known Allergies  Scheduled Meds:   . alfuzosin  10 mg Oral QHS  . amLODipine  5 mg Oral QAC breakfast  . dexamethasone  10 mg Oral Once   Or  . dexamethasone  10 mg Intravenous Once  . docusate sodium  100 mg Oral BID  . enoxaparin (LOVENOX) injection  30 mg Subcutaneous Custom  . levothyroxine  125 mcg Oral QAC breakfast  . scopolamine  1 patch Transdermal Once  . warfarin  5 mg Oral ONCE-1800  . Warfarin - Pharmacist Dosing Inpatient   Does not apply q1800     Medications Prior to Admission  Medication Sig Dispense Refill  . alfuzosin (UROXATRAL) 10 MG 24 hr tablet Take 10 mg by mouth every evening.       . amLODipine (NORVASC) 5 MG tablet Take 5 mg by mouth daily before breakfast.      . levothyroxine (SYNTHROID, LEVOTHROID) 125 MCG tablet Take 125 mcg by mouth daily before breakfast.      . [DISCONTINUED] naproxen sodium (ANAPROX) 220 MG tablet Take 220 mg by mouth. 2 tablets as needed for pain      . [  DISCONTINUED] aspirin 81 MG tablet Take 81 mg by mouth daily.       . [DISCONTINUED] Cholecalciferol (VITAMIN D3) 1000 UNITS CAPS Take 1 capsule by mouth daily.       . [DISCONTINUED] Multiple Vitamin (MULTIVITAMIN) tablet Take 1 tablet by mouth daily.       . [DISCONTINUED] Omega-3 Fatty Acids (FISH OIL) 1200 MG CAPS Take 1,200 mg by mouth daily.         Home: Home Living Lives With: Spouse Available Help at Discharge: Family Type of Home: House   Functional History: Prior Function Able to Take Stairs?: Yes Driving: Yes Vocation: Part time employment Comments: Pt plans rehab following d/c.  Functional Status:  Mobility: Bed Mobility Bed Mobility: Sit to Supine Supine to Sit:  3: Mod assist Supine to Sit: Patient Percentage: 60% Sit to Supine: 3: Mod assist Transfers Transfers: Sit to Stand;Stand to Sit Sit to Stand: 1: +2 Total assist;With upper extremity assist;With armrests;From chair/3-in-1 Sit to Stand: Patient Percentage: 60% Stand to Sit: 1: +2 Total assist;With upper extremity assist;To bed Stand to Sit: Patient Percentage: 70% Ambulation/Gait Ambulation/Gait Assistance: 1: +2 Total assist Ambulation/Gait: Patient Percentage: 80% Ambulation Distance (Feet): 92 Feet Assistive device: Rolling walker Ambulation/Gait Assistance Details: min cues for posture and position from RW Gait Pattern: Step-to pattern;Decreased step length - right;Decreased step length - left    ADL: ADL Grooming: Set up Where Assessed - Grooming: Unsupported sitting Upper Body Bathing: Set up Where Assessed - Upper Body Bathing: Unsupported sitting Lower Body Bathing: +1 Total assistance Where Assessed - Lower Body Bathing: Supported sit to stand Upper Body Dressing: Set up Where Assessed - Upper Body Dressing: Unsupported sitting Lower Body Dressing: +1 Total assistance Where Assessed - Lower Body Dressing: Supported sit to stand Toilet Transfer: +2 Total assistance Toilet Transfer Method: Sit to stand Toilet Transfer Equipment: Other (comment) (recliner) Equipment Used: Rolling walker;Gait belt ADL Comments: Pt mobilizing well. Min cues for ambulation for correct and safe technique.  Cognition: Cognition Arousal/Alertness: Awake/alert Orientation Level: Oriented X4 Cognition Overall Cognitive Status: Appears within functional limits for tasks assessed/performed Arousal/Alertness: Awake/alert Orientation Level: Appears intact for tasks assessed Behavior During Session: WFL for tasks performed   Blood pressure 138/70, pulse 94, temperature 98.8 F (37.1 C), temperature source Oral, resp. rate 16, height 6' (1.829 m), weight 124.286 kg (274 lb), SpO2  92.00%. Physical Exam  Nursing note and vitals reviewed. Constitutional: He is oriented to person, place, and time. He appears well-developed and well-nourished.  HENT:  Head: Normocephalic and atraumatic.  Right Ear: External ear normal.  Left Ear: External ear normal.  Eyes: Conjunctivae normal and EOM are normal. Pupils are equal, round, and reactive to light.  Neck: Normal range of motion. Neck supple. No JVD present. No tracheal deviation present. No thyromegaly present.  Cardiovascular: Normal rate, regular rhythm and normal heart sounds.   No murmur heard. Pulmonary/Chest: Effort normal and breath sounds normal. No respiratory distress. He has no wheezes.  Abdominal: Soft. Bowel sounds are normal. He exhibits no distension. There is no tenderness.  Musculoskeletal: He exhibits edema and tenderness (with ROM bilateral knees. ).       Knee incisions clean and intact with minimal drainage. Legs are appropriately tender   Lymphadenopathy:    He has no cervical adenopathy.  Neurological: He is alert and oriented to person, place, and time. No cranial nerve deficit. Coordination normal.  Skin: Skin is warm and dry. There is erythema (RLE--blancable. ).         Bilateral knee incisions clean and dry with  Steri strips in place (dry blood). Moderate edema bilateral knees down to foot. Minimal warmth RLE.   Psychiatric: He has a normal mood and affect. His behavior is normal. Judgment and thought content normal.    Results for orders placed during the hospital encounter of 12/27/12 (from the past 48 hour(s))  CBC     Status: Abnormal   Collection Time   12/29/12  4:42 AM      Component Value Range Comment   WBC 7.9  4.0 - 10.5 K/uL    RBC 3.42 (*) 4.22 - 5.81 MIL/uL    Hemoglobin 10.2 (*) 13.0 - 17.0 g/dL    HCT 29.5 (*) 39.0 - 52.0 %    MCV 86.3  78.0 - 100.0 fL    MCH 29.8  26.0 - 34.0 pg    MCHC 34.6  30.0 - 36.0 g/dL    RDW 12.8  11.5 - 15.5 %    Platelets 146 (*) 150 - 400 K/uL    BASIC METABOLIC PANEL     Status: Abnormal   Collection Time   12/29/12  4:42 AM      Component Value Range Comment   Sodium 132 (*) 135 - 145 mEq/L    Potassium 3.8  3.5 - 5.1 mEq/L    Chloride 99  96 - 112 mEq/L    CO2 28  19 - 32 mEq/L    Glucose, Bld 134 (*) 70 - 99 mg/dL    BUN 8  6 - 23 mg/dL    Creatinine, Ser 0.75  0.50 - 1.35 mg/dL    Calcium 8.4  8.4 - 10.5 mg/dL    GFR calc non Af Amer >90  >90 mL/min    GFR calc Af Amer >90  >90 mL/min   PROTIME-INR     Status: Normal   Collection Time   12/29/12  4:42 AM      Component Value Range Comment   Prothrombin Time 14.1  11.6 - 15.2 seconds    INR 1.10  0.00 - 1.49   CBC     Status: Abnormal   Collection Time   12/30/12  4:10 AM      Component Value Range Comment   WBC 7.3  4.0 - 10.5 K/uL    RBC 3.15 (*) 4.22 - 5.81 MIL/uL    Hemoglobin 9.3 (*) 13.0 - 17.0 g/dL    HCT 26.8 (*) 39.0 - 52.0 %    MCV 85.1  78.0 - 100.0 fL    MCH 29.5  26.0 - 34.0 pg    MCHC 34.7  30.0 - 36.0 g/dL    RDW 12.9  11.5 - 15.5 %    Platelets 147 (*) 150 - 400 K/uL   PROTIME-INR     Status: Abnormal   Collection Time   12/30/12  4:10 AM      Component Value Range Comment   Prothrombin Time 15.3 (*) 11.6 - 15.2 seconds    INR 1.23  0.00 - 1.49    No results found.  Post Admission Physician Evaluation: 1. Functional deficits secondary  to endstage OA of the bilateral knees s/p bilateral TKA's. 2. Patient is admitted to receive collaborative, interdisciplinary care between the physiatrist, rehab nursing staff, and therapy team. 3. Patient's level of medical complexity and substantial therapy needs in context of that medical necessity cannot be provided at a lesser intensity of care such as a SNF. 4. Patient has experienced   substantial functional loss from his/her baseline which was documented above under the "Functional History" and "Functional Status" headings.  Judging by the patient's diagnosis, physical exam, and functional history, the patient  has potential for functional progress which will result in measurable gains while on inpatient rehab.  These gains will be of substantial and practical use upon discharge  in facilitating mobility and self-care at the household level. 5. Physiatrist will provide 24 hour management of medical needs as well as oversight of the therapy plan/treatment and provide guidance as appropriate regarding the interaction of the two. 6. 24 hour rehab nursing will assist with bladder management, bowel management, safety, skin/wound care, disease management, medication administration, pain management and patient education  and help integrate therapy concepts, techniques,education, etc. 7. PT will assess and treat for:  Lower extremity strength, range of motion, stamina, balance, functional mobility, safety, adaptive techniques and equipment.  Goals are: mod I. 8. OT will assess and treat for: ADL's, functional mobility, safety, upper extremity strength, adaptive techniques and equipment.   Goals are: mod I. 9. SLP will assess and treat for: n/a.  Goals are: n/a. 10. Case Management and Social Worker will assess and treat for psychological issues and discharge planning. 11. Team conference will be held weekly to assess progress toward goals and to determine barriers to discharge. 12. Patient will receive at least 3 hours of therapy per day at least 5 days per week. 13. ELOS: one week      Prognosis:  excellent   Medical Problem List and Plan: 1. DVT Prophylaxis/Anticoagulation: Pharmaceutical: Coumadin and Lovenox 2. Pain Management: reports reasonable with prn meds.  3. Mood: Positive. LCSW to follow for formal evaluation.  4. Neuropsych: This patient is capable of making decisions on his/her own behalf. 5. ABLA:  Recheck in am. Will add iron supplement. 6. Hypothyroid: continue supplement 7. HTN:  Monitor with bid checks. Continue Norvasc. 8. Chronic constipation: On stool softeners without any results so far.  Will change to miralax daily. Suppository prn. 9. BPH: No voiding difficulties reported. Continue alfuzosin.   12/30/2012  Zach Swartz, MD 

## 2013-01-01 ENCOUNTER — Inpatient Hospital Stay (HOSPITAL_COMMUNITY): Payer: 59 | Admitting: Physical Therapy

## 2013-01-01 ENCOUNTER — Inpatient Hospital Stay (HOSPITAL_COMMUNITY): Payer: 59 | Admitting: Occupational Therapy

## 2013-01-01 DIAGNOSIS — I10 Essential (primary) hypertension: Secondary | ICD-10-CM

## 2013-01-01 DIAGNOSIS — D649 Anemia, unspecified: Secondary | ICD-10-CM

## 2013-01-01 LAB — PROTIME-INR
INR: 1.46 (ref 0.00–1.49)
Prothrombin Time: 17.3 seconds — ABNORMAL HIGH (ref 11.6–15.2)

## 2013-01-01 MED ORDER — WARFARIN SODIUM 7.5 MG PO TABS
7.5000 mg | ORAL_TABLET | Freq: Once | ORAL | Status: AC
  Administered 2013-01-01: 7.5 mg via ORAL
  Filled 2013-01-01 (×2): qty 1

## 2013-01-01 NOTE — Progress Notes (Signed)
ANTICOAGULATION CONSULT NOTE - Follow Up Consult  Pharmacy Consult for Warfarin Indication: VTE prophylaxis s/p bilateral TKA  No Known Allergies  Patient Measurements: Height: 6' (182.9 cm) Weight: 280 lb 3.3 oz (127.1 kg) IBW/kg (Calculated) : 77.6   Vital Signs: Temp: 98.3 F (36.8 C) (01/04 0500) Temp src: Oral (01/04 0500) BP: 140/72 mmHg (01/04 0500) Pulse Rate: 73  (01/04 0500)  Labs:  Basename 01/01/13 0638 12/31/12 0630 12/30/12 0410  HGB -- 9.2* 9.3*  HCT -- 26.9* 26.8*  PLT -- 173 147*  APTT -- -- --  LABPROT 17.3* 16.8* 15.3*  INR 1.46 1.40 1.23  HEPARINUNFRC -- -- --  CREATININE -- 0.81 --  CKTOTAL -- -- --  CKMB -- -- --  TROPONINI -- -- --   Estimated Creatinine Clearance: 135.3 ml/min (by C-G formula based on Cr of 0.81).  Medications:  Scheduled:     . alfuzosin  10 mg Oral QHS  . amLODipine  5 mg Oral QAC breakfast  . enoxaparin (LOVENOX) injection  30 mg Subcutaneous Custom  . iron polysaccharides  150 mg Oral q12n4p  . levothyroxine  125 mcg Oral QAC breakfast  . polyethylene glycol  17 g Oral Daily  . [COMPLETED] warfarin  5 mg Oral ONCE-1800  . Warfarin - Pharmacist Dosing Inpatient   Does not apply q1800    Assessment:  67 yoM with osteoarthritis s/p bilateral TKA 12/30.   Coumadin for post-op VTE prophylaxis x 4 weeks, then resume aspirin 81mg  daily.  INR 1.46 today   Lovenox 30mg  SQ q12   Goal of Therapy:  INR 2-3   Plan:   Increase Coumadin to 7.5mg  today at 6pm  Continue Lovenox 30mg  SQ q12h until INR therapeutic  Continue daily PT/INR  Celedonio Miyamoto, PharmD, BCPS Clinical Pharmacist Pager [phone]

## 2013-01-01 NOTE — Progress Notes (Signed)
Patient ID: [name], male   DOB: [birth year], 62 y.o.   MRN: [id number] Subjective/Complaints:  37/31.  62 year old patient status post bilateral total knee replacement surgery.  Pain controlled. Still no bm.  Remains on Lovenox Coumadin anticoagulation. Postop anemia stable with the hemoglobin 9.2 hematocrit 26.9 Examination obese no distress. Chest clear. Cardiovascular normal heart sounds no murmurs. No tachycardia. Abdomen obese soft nondistended. Extremities no significant edema. Bilateral total knee replacement surgery. Incisions healing nicely with Steri-Strips still in place  Lab Results  Component Value Date   INR 1.46 01/01/2013   INR 1.40 12/31/2012   INR 1.23 12/30/2012   BP Readings from Last 3 Encounters:  01/01/13 140/72  12/30/12 116/76  12/30/12 116/76   A 12 point review of systems has been performed and if not noted above is otherwise negative.  Impression- status post bilateral total knee replacement surgery HTN-stable Post op anemia  Plan continue Lovenox until INR therapeutic. Continue comprehensive  inpatient rehabilitation  Objective: Vital Signs: Blood pressure 140/72, pulse 73, temperature 98.3 F (36.8 C), temperature source Oral, resp. rate 18, height 6' (1.829 m), weight 127.1 kg (280 lb 3.3 oz), SpO2 95.00%. No results found.  Basename 12/31/12 0630 12/30/12 0410  WBC 5.6 7.3  HGB 9.2* 9.3*  HCT 26.9* 26.8*  PLT 173 147*    Basename 12/31/12 0630  NA 135  K 3.8  CL 98  GLUCOSE 122*  BUN 13  CREATININE 0.81  CALCIUM 8.7   CBG (last 3)  No results found for this basename: GLUCAP:3 in the last 72 hours  Wt Readings from Last 3 Encounters:  12/30/12 127.1 kg (280 lb 3.3 oz)  12/27/12 124.286 kg (274 lb)  12/27/12 124.286 kg (274 lb)    Physical Exam:  Nursing note and vitals reviewed.  Constitutional: He is oriented to person, place, and time. He appears well-developed and well-nourished.  HENT:  Head: Normocephalic and atraumatic.    Right Ear: External ear normal.  Left Ear: External ear normal.  Eyes: Conjunctivae normal and EOM are normal. Pupils are equal, round, and reactive to light.  Neck: Normal range of motion. Neck supple. No JVD present. No tracheal deviation present. No thyromegaly present.  Cardiovascular: Normal rate, regular rhythm and normal heart sounds.  No murmur heard.  Pulmonary/Chest: Effort normal and breath sounds normal. No respiratory distress. He has no wheezes.  Abdominal: Soft. Bowel sounds are normal. He exhibits no distension. There is no tenderness.  Musculoskeletal: He exhibits edema and tenderness (with ROM bilateral knees. ).  Knee incisions clean and intact with no drainage. Legs are appropriately tender  Lymphadenopathy:  He has no cervical adenopathy.  Neurological: He is alert and oriented to person, place, and time. No cranial nerve deficit. Coordination normal.  Skin: Skin is warm and dry.  . Moderate edema bilateral knees down to foot. Minimal warmth RLE.  Psychiatric: He has a normal mood and affect. His behavior is normal. Judgment and thought content normal   Assessment/Plan: 1. Functional deficits secondary to endstage bilateral knee OA s/p bilateral TKA's which require 3+ hours per day of interdisciplinary therapy in a comprehensive inpatient rehab setting. Physiatrist is providing close team supervision and 24 hour management of active medical problems listed below. Physiatrist and rehab team continue to assess barriers to discharge/monitor patient progress toward functional and medical goals. FIM: FIM - Bathing Bathing Steps Patient Completed: Chest;Right Arm;Left Arm;Abdomen;Front perineal area;Buttocks;Right upper leg;Left upper leg Bathing: 4: Min-Patient completes 8-9 77f 10 parts or  75+ percent  FIM - Upper Body Dressing/Undressing Upper body dressing/undressing steps patient completed: Thread/unthread right sleeve of pullover shirt/dresss;Thread/unthread left  sleeve of pullover shirt/dress;Put head through opening of pull over shirt/dress;Pull shirt over trunk Upper body dressing/undressing: 5: Set-up assist to: Obtain clothing/put away FIM - Lower Body Dressing/Undressing Lower body dressing/undressing: 1: Total-Patient completed less than 25% of tasks  FIM - Toileting Toileting steps completed by patient: Adjust clothing prior to toileting;Performs perineal hygiene;Adjust clothing after toileting Toileting: 4: Steadying assist  FIM - Archivist Transfers: 4-To toilet/BSC: Min A (steadying Pt. > 75%);4-From toilet/BSC: Min A (steadying Pt. > 75%)  FIM - Banker Devices: Walker;Bed rails;HOB elevated Bed/Chair Transfer: 5: Supine > Sit: Supervision (verbal cues/safety issues);5: Sit > Supine: Supervision (verbal cues/safety issues);3: Bed > Chair or W/C: Mod A (lift or lower assist);3: Chair or W/C > Bed: Mod A (lift or lower assist)  FIM - Locomotion: Wheelchair Distance: 150 Locomotion: Wheelchair: 2: Travels 50 - 149 ft with supervision, cueing or coaxing FIM - Locomotion: Ambulation Locomotion: Ambulation Assistive Devices: Designer, industrial/product Ambulation/Gait Assistance: 4: Min assist Locomotion: Ambulation: 1: Travels less than 50 ft with minimal assistance (Pt.>75%)  Comprehension Comprehension Mode: Auditory Comprehension: 7-Follows complex conversation/direction: With no assist  Expression Expression Mode: Verbal Expression: 7-Expresses complex ideas: With no assist  Social Interaction Social Interaction: 7-Interacts appropriately with others - No medications needed.  Problem Solving Problem Solving: 7-Solves complex problems: Recognizes & self-corrects  Memory Memory: 7-Complete Independence: No helper  Medical Problem List and Plan:  1. DVT Prophylaxis/Anticoagulation: Pharmaceutical: Coumadin and Lovenox  2. Pain Management: reports reasonable with prn meds.  3. Mood:  Positive. LCSW to follow for formal evaluation.  4. Neuropsych: This patient is capable of making decisions on his/her own behalf.  5. ABLA: Recheck in am. Will add iron supplement.  6. Hypothyroid: continue supplement  7. HTN: Monitor with bid checks. Continue Norvasc.  8. Chronic constipation: On stool softeners without any results so far. Now on miralax daily. Suppository prn. Enema if needed as well 9. BPH: No voiding difficulties reported. Continue alfuzosin  LOS (Days) 2 A FACE TO FACE EVALUATION WAS PERFORMED  Rogelia Boga 01/01/2013 8:35 AM

## 2013-01-01 NOTE — Progress Notes (Signed)
Occupational Therapy Session Note  Patient Details  Name: [name] MRN: [id number] Date of Birth: [birth year]  Today's Date: 01/01/2013 Time:  830-930 and 1300-1330 Time Calculation (min):60 min and 30=90  Skilled Therapeutic Interventions/Progress Updates: AM:  ADL in room shower on bench.  Patient with difficulty washing buttocks due to not enough room on bench for lateral leans to pericleanse yet not allowed to weight bear bilateral LEs without immobilizer for standing in shower yet.  Otherwise, patient safe and only supervision for functional mobility.  PM: session UE there exercise and this clinician placed tub transfer bench in patient shower so that he will have room to safely laterall lean to complete buttock cleansing until he is allowed to standing weight bearing without immobilizers.     Therapy Documentation Precautions:  Precautions Precautions: Knee;Fall Required Braces or Orthoses: Knee Immobilizer - Right;Knee Immobilizer - Left Knee Immobilizer - Right: Discontinue once straight leg raise with < 10 degree lag Knee Immobilizer - Left: Discontinue once straight leg raise with < 10 degree lag Restrictions Weight Bearing Restrictions: Yes LLE Weight Bearing: Weight bearing as tolerated Other Position/Activity Restrictions: WBAT    Pain:denied   See FIM for current functional status  Therapy/Group: Individual Therapy  Bud Face [facility] 01/01/2013, 3:27 PM

## 2013-01-01 NOTE — Progress Notes (Signed)
Physical Therapy Session Note  Patient Details  Name: [name] MRN: [id number] Date of Birth: Dec 18, 1951  Today's Date: 01/01/2013 Time: 1400-1445 Time Calculation (min): 45 min  Short Term Goals: Week 1:  PT Short Term Goal 1 (Week 1): STGs=LTGs  Therapy Documentation Precautions:  Precautions Precautions: Knee;Fall Required Braces or Orthoses: Knee Immobilizer - Right;Knee Immobilizer - Left Knee Immobilizer - Right: Discontinue once straight leg raise with < 10 degree lag Knee Immobilizer - Left: Discontinue once straight leg raise with < 10 degree lag Restrictions Weight Bearing Restrictions: Yes LLE Weight Bearing: Weight bearing as tolerated Other Position/Activity Restrictions: WBAT Pain: Pain Assessment Pain Score:   1 Faces Pain Scale: Hurts a little bit  Therapeutic Exercise:(30') B LE's in supine.  Sitting PROM with Deep tissue massage to quad muscle belly to enhance knee flexion. Education of patient to encourage knee flexion while in w/c. Therapeutic Activity:(15) Transfer training and mobility with min-A due to limited B knee flexion.  Patient needing to transfer off/on of elevated surfaces  Knee ROM: Right knee extension limited 5 degrees and flexion to 60 degrees.  Left knee extension limited 5 degrees and flexion to 70 degrees.   See FIM for current functional status  Therapy/Group: Individual Therapy  LUNDGREN, DANIEL J 01/01/2013, 2:16 PM

## 2013-01-01 NOTE — Progress Notes (Signed)
Physical Therapy Session Note  Patient Details  Name: [name] MRN: [id number] Date of Birth: 01/17/1951  Today's Date: 01/01/2013 Time: 0930-1030 Time Calculation (min): 60 min  Short Term Goals: Week 1:  PT Short Term Goal 1 (Week 1): STGs=LTGs  Therapy Documentation Precautions:  Precautions Precautions: Knee;Fall Required Braces or Orthoses: Knee Immobilizer - Right;Knee Immobilizer - Left Knee Immobilizer - Right: Discontinue once straight leg raise with < 10 degree lag Knee Immobilizer - Left: Discontinue once straight leg raise with < 10 degree lag Restrictions Weight Bearing Restrictions: Yes LLE Weight Bearing: Weight bearing as tolerated Other Position/Activity Restrictions: WBAT Pain: Pain Assessment Pain Assessment: 0-10 Pain Score:   3 Pain Type: Surgical pain Pain Location: Knee Pain Orientation: Right;Left Pain Descriptors: Aching Pain Onset: On-going Pain Intervention(s): Medication (See eMAR)  Therapeutic Exercise:(30) B LE's in supine and sitting A/AAROM, manual stretching of achilles and into knee flexion B Therapeutic Activity:(30') Transfer Training supine<->sit and sit<->stand with S/min-A, bed<->w/c S/min-A, Gait using RW x 180' S/Mod-I  Patient has good control into SLR following LE hip abduction exercise in supine.  See FIM for current functional status  Therapy/Group: Individual Therapy  Rex Kras 01/01/2013, 9:53 AM

## 2013-01-02 ENCOUNTER — Inpatient Hospital Stay (HOSPITAL_COMMUNITY): Payer: 59 | Admitting: *Deleted

## 2013-01-02 LAB — PROTIME-INR
INR: 1.51 — ABNORMAL HIGH (ref 0.00–1.49)
Prothrombin Time: 17.8 seconds — ABNORMAL HIGH (ref 11.6–15.2)

## 2013-01-02 MED ORDER — SORBITOL 70 % SOLN
60.0000 mL | Freq: Every day | Status: AC | PRN
  Administered 2013-01-02: 60 mL via ORAL

## 2013-01-02 MED ORDER — SORBITOL 70 % SOLN: 66.0000 mL | Freq: Every day | Status: DC | PRN

## 2013-01-02 MED ORDER — WARFARIN SODIUM 7.5 MG PO TABS
7.5000 mg | ORAL_TABLET | Freq: Once | ORAL | Status: AC
  Administered 2013-01-02: 7.5 mg via ORAL
  Filled 2013-01-02: qty 1

## 2013-01-02 NOTE — Progress Notes (Signed)
Patient ID: [name], male   DOB: [birth year], 62 y.o.   MRN: [id number]  Patient ID: [name], male   DOB: [birth year], 62 y.o.   MRN: [id number] Subjective/Complaints:  44/71.   62 year old patient status post bilateral total knee replacement surgery.  Pain controlled. Still no bm.  Remains on Lovenox Coumadin anticoagulation. Postop anemia stable with the hemoglobin 9.2 hematocrit 26.9 Examination obese no distress. Chest clear. Cardiovascular normal heart sounds no murmurs. No tachycardia. Abdomen obese soft nondistended. Extremities no significant edema. Bilateral total knee replacement surgery. Incisions healing nicely with Steri-Strips still in place  Lab Results  Component Value Date   INR 1.51* 01/02/2013   INR 1.46 01/01/2013   INR 1.40 12/31/2012   BP Readings from Last 3 Encounters:  01/02/13 126/74  12/30/12 116/76  12/30/12 116/76   A 12 point review of systems has been performed and if not noted above is otherwise negative.  Impression- status post bilateral total knee replacement surgery HTN-stable Post op anemia  Plan continue Lovenox until INR therapeutic. Continue comprehensive  inpatient rehabilitation  Objective: Vital Signs: Blood pressure 126/74, pulse 81, temperature 98.4 F (36.9 C), temperature source Oral, resp. rate 17, height 6' (1.829 m), weight 127.1 kg (280 lb 3.3 oz), SpO2 93.00%. No results found.  Basename 12/31/12 0630  WBC 5.6  HGB 9.2*  HCT 26.9*  PLT 173    Basename 12/31/12 0630  NA 135  K 3.8  CL 98  GLUCOSE 122*  BUN 13  CREATININE 0.81  CALCIUM 8.7   CBG (last 3)  No results found for this basename: GLUCAP:3 in the last 72 hours  Wt Readings from Last 3 Encounters:  12/30/12 127.1 kg (280 lb 3.3 oz)  12/27/12 124.286 kg (274 lb)  12/27/12 124.286 kg (274 lb)    Physical Exam:  Nursing note and vitals reviewed.  Constitutional: He is oriented to person, place, and time. He appears well-developed and well-nourished.    HENT:  Head: Normocephalic and atraumatic.  Right Ear: External ear normal.  Left Ear: External ear normal.  Eyes: Conjunctivae normal and EOM are normal. Pupils are equal, round, and reactive to light.  Neck: Normal range of motion. Neck supple. No JVD present. No tracheal deviation present. No thyromegaly present.  Cardiovascular: Normal rate, regular rhythm and normal heart sounds.  No murmur heard.  Pulmonary/Chest: Effort normal and breath sounds normal. No respiratory distress. He has no wheezes.  Abdominal: Soft. Bowel sounds are normal. He exhibits no distension. There is no tenderness.  Musculoskeletal: He exhibits edema and tenderness (with ROM bilateral knees. ).  Knee incisions clean and intact with no drainage. Legs are appropriately tender  Lymphadenopathy:  He has no cervical adenopathy.  Neurological: He is alert and oriented to person, place, and time. No cranial nerve deficit. Coordination normal.  Skin: Skin is warm and dry.  . Moderate edema bilateral knees down to foot. Minimal warmth RLE.  Psychiatric: He has a normal mood and affect. His behavior is normal. Judgment and thought content normal   Assessment/Plan: 1. Functional deficits secondary to endstage bilateral knee OA s/p bilateral TKA's which require 3+ hours per day of interdisciplinary therapy in a comprehensive inpatient rehab setting. Physiatrist is providing close team supervision and 24 hour management of active medical problems listed below. Physiatrist and rehab team continue to assess barriers to discharge/monitor patient progress toward functional and medical goals. FIM: FIM - Bathing Bathing Steps Patient Completed: Chest;Right Arm;Left Arm;Abdomen;Front perineal area;Right upper  leg;Left upper leg;Right lower leg (including foot);Left lower leg (including foot) Bathing: 4: Min-Patient completes 8-9 36f 10 parts or 75+ percent (tried lateral leans for buttocks but shower chair too narrow)  FIM -  Upper Body Dressing/Undressing Upper body dressing/undressing steps patient completed: Thread/unthread right sleeve of pullover shirt/dresss;Thread/unthread left sleeve of pullover shirt/dress;Put head through opening of pull over shirt/dress;Pull shirt over trunk Upper body dressing/undressing: 5: Supervision: Safety issues/verbal cues FIM - Lower Body Dressing/Undressing Lower body dressing/undressing steps patient completed: Pull pants up/down Lower body dressing/undressing: 1: Total-Patient completed less than 25% of tasks  FIM - Toileting Toileting steps completed by patient: Adjust clothing prior to toileting;Performs perineal hygiene;Adjust clothing after toileting Toileting: 5: Supervision: Safety issues/verbal cues  FIM - Archivist Transfers Assistive Devices: Elevated toilet seat;Grab bars Toilet Transfers: 5-To toilet/BSC: Supervision (verbal cues/safety issues);5-From toilet/BSC: Supervision (verbal cues/safety issues)  FIM - Banker Devices: Walker;Bed rails;HOB elevated Bed/Chair Transfer: 6: Supine > Sit: No assist;5: Bed > Chair or W/C: Supervision (verbal cues/safety issues)  FIM - Locomotion: Wheelchair Distance: 150 Locomotion: Wheelchair: 2: Travels 50 - 149 ft with supervision, cueing or coaxing FIM - Locomotion: Ambulation Locomotion: Ambulation Assistive Devices: Designer, industrial/product Ambulation/Gait Assistance: 4: Min assist Locomotion: Ambulation: 1: Travels less than 50 ft with minimal assistance (Pt.>75%)  Comprehension Comprehension Mode: Auditory Comprehension: 7-Follows complex conversation/direction: With no assist  Expression Expression Mode: Verbal Expression: 7-Expresses complex ideas: With no assist  Social Interaction Social Interaction: 7-Interacts appropriately with others - No medications needed.  Problem Solving Problem Solving: 7-Solves complex problems: Recognizes &  self-corrects  Memory Memory: 7-Complete Independence: No helper  Medical Problem List and Plan:  1. DVT Prophylaxis/Anticoagulation: Pharmaceutical: Coumadin and Lovenox  2. Pain Management: reports reasonable with prn meds.  3. Mood: Positive. LCSW to follow for formal evaluation.  4. Neuropsych: This patient is capable of making decisions on his/her own behalf.  5. ABLA: Recheck in am. Will add iron supplement.  6. Hypothyroid: continue supplement  7. HTN: Monitor with bid checks. Continue Norvasc.  8. Chronic constipation: On stool softeners without any results so far. Now on miralax daily. Suppository prn. Enema if needed as well 9. BPH: No voiding difficulties reported. Continue alfuzosin  LOS (Days) 3 A FACE TO FACE EVALUATION WAS PERFORMED  Rogelia Boga 01/02/2013 8:13 AM

## 2013-01-02 NOTE — Progress Notes (Signed)
ANTICOAGULATION CONSULT NOTE - Follow Up Consult  Pharmacy Consult for Warfarin Indication: VTE prophylaxis s/p bilateral TKA  No Known Allergies  Patient Measurements: Height: 6' (182.9 cm) Weight: 280 lb 3.3 oz (127.1 kg) IBW/kg (Calculated) : 77.6   Vital Signs: Temp: 98.4 F (36.9 C) (01/05 0440) Temp src: Oral (01/05 0440) BP: 126/74 mmHg (01/05 0625) Pulse Rate: 81  (01/05 0440)  Labs:  Basename 01/02/13 0623 01/01/13 1610 12/31/12 0630  HGB -- -- 9.2*  HCT -- -- 26.9*  PLT -- -- 173  APTT -- -- --  LABPROT 17.8* 17.3* 16.8*  INR 1.51* 1.46 1.40  HEPARINUNFRC -- -- --  CREATININE -- -- 0.81  CKTOTAL -- -- --  CKMB -- -- --  TROPONINI -- -- --   Estimated Creatinine Clearance: 135.3 ml/min (by C-G formula based on Cr of 0.81).  Medications:  Scheduled:     . alfuzosin  10 mg Oral QHS  . amLODipine  5 mg Oral QAC breakfast  . enoxaparin (LOVENOX) injection  30 mg Subcutaneous Custom  . iron polysaccharides  150 mg Oral q12n4p  . levothyroxine  125 mcg Oral QAC breakfast  . polyethylene glycol  17 g Oral Daily  . [COMPLETED] warfarin  7.5 mg Oral ONCE-1800  . warfarin  7.5 mg Oral ONCE-1800  . Warfarin - Pharmacist Dosing Inpatient   Does not apply q1800    Assessment:  44 yoM with osteoarthritis s/p bilateral TKA 12/30.   Coumadin for post-op VTE prophylaxis x 4 weeks, then resume aspirin 81mg  daily.  INR 1.51 today   Lovenox 30mg  SQ q12   Goal of Therapy:  INR 2-3   Plan:   Coumadin 7.5mg  today at 6pm  Continue Lovenox 30mg  SQ q12h until INR therapeutic  Continue daily PT/INR  Celedonio Miyamoto, PharmD, BCPS Clinical Pharmacist Pager [phone]

## 2013-01-02 NOTE — Progress Notes (Signed)
Occupational Therapy Note  Patient Details  Name: [name] MRN: [id number] Date of Birth: [birth year] Today's Date: 01/02/2013  Time:  1430-1515  ( ) Pain 3/10 both knees Individual session  Engaged in therapeutic UE arm exercises using  5#.  Did 3 sets of each exercise.  Pt tolerated well.  Practiced sit to stand with minimal to supervision assist.     Humberto Seals 01/02/2013, 3:19 PM

## 2013-01-02 NOTE — Plan of Care (Signed)
Problem: RH BOWEL ELIMINATION Goal: RH STG MANAGE BOWEL WITH ASSISTANCE STG Manage Bowel with Assistance. Modified independent  Outcome: Progressing 60 mL sorbitol given Goal: RH STG MANAGE BOWEL W/MEDICATION W/ASSISTANCE STG Manage Bowel with Medication with minimal Assistance.  Outcome: Progressing Sorbitol given

## 2013-01-03 ENCOUNTER — Inpatient Hospital Stay (HOSPITAL_COMMUNITY): Payer: 59 | Admitting: Occupational Therapy

## 2013-01-03 ENCOUNTER — Inpatient Hospital Stay (HOSPITAL_COMMUNITY): Payer: 59

## 2013-01-03 DIAGNOSIS — D62 Acute posthemorrhagic anemia: Secondary | ICD-10-CM

## 2013-01-03 DIAGNOSIS — Z96659 Presence of unspecified artificial knee joint: Secondary | ICD-10-CM

## 2013-01-03 DIAGNOSIS — M1710 Unilateral primary osteoarthritis, unspecified knee: Secondary | ICD-10-CM

## 2013-01-03 LAB — PROTIME-INR
INR: 1.77 — ABNORMAL HIGH (ref 0.00–1.49)
Prothrombin Time: 20 seconds — ABNORMAL HIGH (ref 11.6–15.2)

## 2013-01-03 MED ORDER — WARFARIN SODIUM 7.5 MG PO TABS
7.5000 mg | ORAL_TABLET | Freq: Once | ORAL | Status: AC
  Administered 2013-01-03: 7.5 mg via ORAL
  Filled 2013-01-03: qty 1

## 2013-01-03 MED ORDER — POLYETHYLENE GLYCOL 3350 17 G PO PACK
17.0000 g | PACK | Freq: Two times a day (BID) | ORAL | Status: DC
  Administered 2013-01-04 – 2013-01-05 (×4): 17 g via ORAL
  Filled 2013-01-03 (×5): qty 1

## 2013-01-03 NOTE — Progress Notes (Signed)
Physical Therapy Session Note  Patient Details  Name: [name] MRN: [id number] Date of Birth: 18-Jul-1951  Today's Date: 01/03/2013 Time: 835-930 (55 minutes)  Short Term Goals: Week 1:  PT Short Term Goal 1 (Week 1): STGs=LTGs  Skilled Therapeutic Interventions/Progress Updates:    Pt able to complete L SLR independently and discussed d/c of KI on the L as able to tolerate (preferred gait trial this AM with bilateral Ki's) - pt in agreement. Steady A sit to stand initially from EOB. Gait to/from gym with S using RW for endurance and strengthening; R toe in noted (pt reports this is premorbid). Stair training for home entry (outside and to second floor of house) initially with bilateral KI's and both rails and progressed to L rail with only R KI on. No bouts of instability noted and pt able to complete with min A. Seated assisted knee flexion edge of mat x 10 reps x 2 sets each to promote knee flexion and LAQ with 3 second hold x 10 reps each. S back to bed and bilateral ice packs applied for pain/edema control.  Therapy Documentation Precautions:  Precautions Precautions: Knee;Fall Required Braces or Orthoses: Knee Immobilizer - Right;Knee Immobilizer - Left Knee Immobilizer - Right: Discontinue once straight leg raise with < 10 degree lag Knee Immobilizer - Left: Discontinue once straight leg raise with < 10 degree lag Restrictions Weight Bearing Restrictions: Yes LLE Weight Bearing: Weight bearing as tolerated Other Position/Activity Restrictions: WBAT  Pain: Pain Assessment Pain Score:   1 Pain Descriptors: Dull  See FIM for current functional status  Therapy/Group: Individual Therapy  Karolee Stamps [facility] 01/03/2013, 9:29 AM

## 2013-01-03 NOTE — Plan of Care (Signed)
Problem: RH SAFETY Goal: RH STG ADHERE TO SAFETY PRECAUTIONS W/ASSISTANCE/DEVICE STG Adhere to Safety Precautions With Assistance/Device. independent  Outcome: Progressing Calls appropriately for assistance

## 2013-01-03 NOTE — Progress Notes (Signed)
ANTICOAGULATION CONSULT NOTE - Follow Up Consult  Pharmacy Consult for Coumadin Indication: VTE prophylaxis  No Known Allergies  Patient Measurements: Height: 6' (182.9 cm) Weight: 280 lb 3.3 oz (127.1 kg) IBW/kg (Calculated) : 77.6  Heparin Dosing Weight:   Vital Signs: Temp: 98.2 F (36.8 C) (01/06 0601) BP: 127/69 mmHg (01/06 0639) Pulse Rate: 78  (01/06 0601)  Labs:  Basename 01/03/13 0530 01/02/13 0623 01/01/13 0638  HGB -- -- --  HCT -- -- --  PLT -- -- --  APTT -- -- --  LABPROT 20.0* 17.8* 17.3*  INR 1.77* 1.51* 1.46  HEPARINUNFRC -- -- --  CREATININE -- -- --  CKTOTAL -- -- --  CKMB -- -- --  TROPONINI -- -- --    Estimated Creatinine Clearance: 135.3 ml/min (by C-G formula based on Cr of 0.81).   Medications:  Scheduled:    . alfuzosin  10 mg Oral QHS  . amLODipine  5 mg Oral QAC breakfast  . enoxaparin (LOVENOX) injection  30 mg Subcutaneous Custom  . iron polysaccharides  150 mg Oral q12n4p  . levothyroxine  125 mcg Oral QAC breakfast  . polyethylene glycol  17 g Oral BID  . [COMPLETED] warfarin  7.5 mg Oral ONCE-1800  . Warfarin - Pharmacist Dosing Inpatient   Does not apply q1800  . [DISCONTINUED] polyethylene glycol  17 g Oral Daily    Assessment: 62yo male s/p B-TKA, on Couamdin for VTE px.  INR 1.77, increasing nicely.  No bleeding problems noted.  Goal of Therapy:  INR 2-3 Monitor platelets by anticoagulation protocol: Yes   Plan:  1.  Repeat Coumadin 7.5mg  2.  Continue daily INR  Marisue Humble, PharmD Clinical Pharmacist [facility]

## 2013-01-03 NOTE — Progress Notes (Signed)
Subjective/Complaints: Good weekend. Hasn't moved bowel. Denies abd pain or bloating. Pain controlled  A 12 point review of systems has been performed and if not noted above is otherwise negative.   Objective: Vital Signs: Blood pressure 127/69, pulse 78, temperature 98.2 F (36.8 C), temperature source Oral, resp. rate 18, height 6' (1.829 m), weight 127.1 kg (280 lb 3.3 oz), SpO2 95.00%. No results found. No results found for this basename: WBC:2,HGB:2,HCT:2,PLT:2 in the last 72 hours No results found for this basename: NA:2,K:2,CL:2,CO:2,GLUCOSE:2,BUN:2,CREATININE:2,CALCIUM:2 in the last 72 hours CBG (last 3)  No results found for this basename: GLUCAP:3 in the last 72 hours  Wt Readings from Last 3 Encounters:  12/30/12 127.1 kg (280 lb 3.3 oz)  12/27/12 124.286 kg (274 lb)  12/27/12 124.286 kg (274 lb)    Physical Exam:  Nursing note and vitals reviewed.  Constitutional: He is oriented to person, place, and time. He appears well-developed and well-nourished.  HENT:  Head: Normocephalic and atraumatic.  Right Ear: External ear normal.  Left Ear: External ear normal.  Eyes: Conjunctivae normal and EOM are normal. Pupils are equal, round, and reactive to light.  Neck: Normal range of motion. Neck supple. No JVD present. No tracheal deviation present. No thyromegaly present.  Cardiovascular: Normal rate, regular rhythm and normal heart sounds.  No murmur heard.  Pulmonary/Chest: Effort normal and breath sounds normal. No respiratory distress. He has no wheezes.  Abdominal: Soft. Bowel sounds are normal. He exhibits no distension. There is no tenderness.  Musculoskeletal: He exhibits decreased edema and tenderness (with ROM bilateral knees. ).  Knee incisions clean and intact with no drainage. Legs are appropriately tender  Lymphadenopathy:  He has no cervical adenopathy.  Neurological: He is alert and oriented to person, place, and time. No cranial nerve deficit. Coordination  normal.  Skin: Skin is warm and dry.  . Trace/mild edema bilateral knees down to foot. Minimal warmth RLE.  Psychiatric: He has a normal mood and affect. His behavior is normal. Judgment and thought content normal   Assessment/Plan: 1. Functional deficits secondary to endstage bilateral knee OA s/p bilateral TKA's which require 3+ hours per day of interdisciplinary therapy in a comprehensive inpatient rehab setting. Physiatrist is providing close team supervision and 24 hour management of active medical problems listed below. Physiatrist and rehab team continue to assess barriers to discharge/monitor patient progress toward functional and medical goals. FIM: FIM - Bathing Bathing Steps Patient Completed: Chest;Right Arm;Left Arm;Abdomen;Front perineal area;Right upper leg;Left upper leg;Right lower leg (including foot);Left lower leg (including foot) Bathing: 4: Min-Patient completes 8-9 26f 10 parts or 75+ percent (tried lateral leans for buttocks but shower chair too narrow)  FIM - Upper Body Dressing/Undressing Upper body dressing/undressing steps patient completed: Thread/unthread right sleeve of pullover shirt/dresss;Thread/unthread left sleeve of pullover shirt/dress;Put head through opening of pull over shirt/dress;Pull shirt over trunk Upper body dressing/undressing: 5: Supervision: Safety issues/verbal cues FIM - Lower Body Dressing/Undressing Lower body dressing/undressing steps patient completed: Pull pants up/down Lower body dressing/undressing: 1: Total-Patient completed less than 25% of tasks  FIM - Toileting Toileting steps completed by patient: Adjust clothing prior to toileting;Performs perineal hygiene;Adjust clothing after toileting Toileting: 5: Supervision: Safety issues/verbal cues  FIM - Archivist Transfers Assistive Devices: Elevated toilet seat;Grab bars Toilet Transfers: 5-To toilet/BSC: Supervision (verbal cues/safety issues);5-From toilet/BSC:  Supervision (verbal cues/safety issues)  FIM - Banker Devices: Walker;Bed rails;HOB elevated Bed/Chair Transfer: 6: Supine > Sit: No assist;5: Bed > Chair or W/C:  Supervision (verbal cues/safety issues)  FIM - Locomotion: Wheelchair Distance: 150 Locomotion: Wheelchair: 2: Travels 50 - 149 ft with supervision, cueing or coaxing FIM - Locomotion: Ambulation Locomotion: Ambulation Assistive Devices: Designer, industrial/product Ambulation/Gait Assistance: 4: Min assist Locomotion: Ambulation: 1: Travels less than 50 ft with minimal assistance (Pt.>75%)  Comprehension Comprehension Mode: Auditory Comprehension: 7-Follows complex conversation/direction: With no assist  Expression Expression Mode: Verbal Expression: 7-Expresses complex ideas: With no assist  Social Interaction Social Interaction: 7-Interacts appropriately with others - No medications needed.  Problem Solving Problem Solving: 7-Solves complex problems: Recognizes & self-corrects  Memory Memory: 7-Complete Independence: No helper  Medical Problem List and Plan:  1. DVT Prophylaxis/Anticoagulation: Pharmaceutical: Coumadin and Lovenox  2. Pain Management: reports reasonable with prn meds.  3. Mood: Positive. LCSW to follow for formal evaluation.  4. Neuropsych: This patient is capable of making decisions on his/her own behalf.  5. ABLA: fe supplement  6. Hypothyroid: continue supplement  7. HTN: Monitor with bid checks. Continue Norvasc.  8. Chronic constipation: On stool softeners without any results so far.  on miralax daily. Suppository today if no bm 9. BPH: No voiding difficulties reported. Continue alfuzosin  LOS (Days) 4 A FACE TO FACE EVALUATION WAS PERFORMED  SWARTZ,ZACHARY T 01/03/2013 8:24 AM

## 2013-01-03 NOTE — Progress Notes (Signed)
Physical Therapy Session Note  Patient Details  Name: [name] MRN: [id number] Date of Birth: Jan 23, 1951  Today's Date: 01/03/2013 Time: 1191-4782 Time Calculation (min): 35 min  Short Term Goals: Week 1:  PT Short Term Goal 1 (Week 1): STGs=LTGs  Skilled Therapeutic Interventions/Progress Updates:   Pt's wife present to observe therapy session and participated in education in regards to HEP/therex, follow up recommendations, and overall goals. Pt completed supine hip abduction, SAQ, heel slides (using sheet for assisted motion), quad sets, and SLRs x 10 reps each x 2 sets. Reviewed seated exercises with pt's wife also. Gait to/from therapy gym with S using RW, cues for more normalized gait pattern especially on LLE due to no KI. Toileting back in the room with S using Rw and returned back to bed.   Therapy Documentation Precautions:  Precautions Precautions: Knee;Fall Required Braces or Orthoses: Knee Immobilizer - Right;Knee Immobilizer - Left Knee Immobilizer - Right: Discontinue once straight leg raise with < 10 degree lag Knee Immobilizer - Left: Discontinue once straight leg raise with < 10 degree lag Restrictions Weight Bearing Restrictions: Yes LLE Weight Bearing: Weight bearing as tolerated Other Position/Activity Restrictions: WBAT  Pain: No complaints.   See FIM for current functional status  Therapy/Group: Individual Therapy  Karolee Stamps [facility] 01/03/2013, 3:57 PM

## 2013-01-03 NOTE — Progress Notes (Signed)
Occupational Therapy Session Notes  Patient Details  Name: [name] MRN: [id number] Date of Birth: 1951-05-08  Today's Date: 01/03/2013  Short Term Goals: Week 1:  OT Short Term Goal 1 (Week 1): Short Term Goals = Long Term Goals  Skilled Therapeutic Interventions/Progress Updates:   Session #1 [phone] - 55 Minutes Individual Therapy No complaints of pain Patient found supine in bed. Patient engaged in bed mobility for edge of bed -> stand with rolling walker -> ambulation to bathroom for shower stall transfer. Patient then performed UB/LB bathing in shower. Focused skilled intervention on use of AE prn (reacher, sock aid, long handled sponge), bed mobility, UB/LB bathing & dressing, sit/stands, overall activity tolerance/endurance, BLE management, and grooming tasks seated at sink in w/c. Left patient seated in w/c with bilateral leg rests donned and call bell & phone within reach.   Session #2 1345-1430 - 45 Minutes  Individual Therapy No complaints of pain Patient found supine in bed. Engaged in bed mobility and patient ambulated into bathroom for toileting with supervision. Patient then ambulated from room -> ADL apartment with one seated rest break. In ADL apartment patient performed bed mobility on regular bed. Therapist gave patient leg lifter to increase independence with BLE exercises at bed level. Patient then ambulated from ADL apartment -> therapy gym for exercise using NuStep for 5 minutes. Therapist propelled patient back to room and left patient supine in bed with call bell & phone within reach.   Precautions:  Precautions Precautions: Knee;Fall Required Braces or Orthoses: Knee Immobilizer - Right;Knee Immobilizer - Left Knee Immobilizer - Right: Discontinue once straight leg raise with < 10 degree lag Knee Immobilizer - Left: Discontinue once straight leg raise with < 10 degree lag Restrictions Weight Bearing Restrictions: Yes LLE Weight Bearing: Weight bearing  as tolerated Other Position/Activity Restrictions: WBAT  See FIM for current functional status  CLAY,PATRICIA 01/03/2013, 11:09 AM

## 2013-01-04 ENCOUNTER — Inpatient Hospital Stay (HOSPITAL_COMMUNITY): Payer: 59 | Admitting: Physical Therapy

## 2013-01-04 ENCOUNTER — Inpatient Hospital Stay (HOSPITAL_COMMUNITY): Payer: 59 | Admitting: *Deleted

## 2013-01-04 ENCOUNTER — Telehealth: Payer: Self-pay | Admitting: *Deleted

## 2013-01-04 ENCOUNTER — Inpatient Hospital Stay (HOSPITAL_COMMUNITY): Payer: 59 | Admitting: Occupational Therapy

## 2013-01-04 ENCOUNTER — Inpatient Hospital Stay (HOSPITAL_COMMUNITY): Payer: 59

## 2013-01-04 LAB — PROTIME-INR
INR: 1.95 — ABNORMAL HIGH (ref 0.00–1.49)
Prothrombin Time: 21.5 seconds — ABNORMAL HIGH (ref 11.6–15.2)

## 2013-01-04 MED ORDER — WARFARIN SODIUM 7.5 MG PO TABS
7.5000 mg | ORAL_TABLET | Freq: Once | ORAL | Status: AC
  Administered 2013-01-04: 7.5 mg via ORAL
  Filled 2013-01-04: qty 1

## 2013-01-04 NOTE — Progress Notes (Signed)
Subjective/Complaints: Moved bowels. Good night. Pain controlled  A 12 point review of systems has been performed and if not noted above is otherwise negative.   Objective: Vital Signs: Blood pressure 113/69, pulse 72, temperature 98.6 F (37 C), temperature source Oral, resp. rate 19, height 6' (1.829 m), weight 127.1 kg (280 lb 3.3 oz), SpO2 96.00%. No results found. No results found for this basename: WBC:2,HGB:2,HCT:2,PLT:2 in the last 72 hours No results found for this basename: NA:2,K:2,CL:2,CO:2,GLUCOSE:2,BUN:2,CREATININE:2,CALCIUM:2 in the last 72 hours CBG (last 3)  No results found for this basename: GLUCAP:3 in the last 72 hours  Wt Readings from Last 3 Encounters:  12/30/12 127.1 kg (280 lb 3.3 oz)  12/27/12 124.286 kg (274 lb)  12/27/12 124.286 kg (274 lb)    Physical Exam:  Nursing note and vitals reviewed.  Constitutional: He is oriented to person, place, and time. He appears well-developed and well-nourished.  HENT:  Head: Normocephalic and atraumatic.  Right Ear: External ear normal.  Left Ear: External ear normal.  Eyes: Conjunctivae normal and EOM are normal. Pupils are equal, round, and reactive to light.  Neck: Normal range of motion. Neck supple. No JVD present. No tracheal deviation present. No thyromegaly present.  Cardiovascular: Normal rate, regular rhythm and normal heart sounds.  No murmur heard.  Pulmonary/Chest: Effort normal and breath sounds normal. No respiratory distress. He has no wheezes.  Abdominal: Soft. Bowel sounds are normal. He exhibits no distension. There is no tenderness.  Musculoskeletal: He exhibits decreased edema and tenderness (with ROM bilateral knees. ).  Knee incisions clean and intact with no drainage. Legs are appropriately tender  Lymphadenopathy:  He has no cervical adenopathy.  Neurological: He is alert and oriented to person, place, and time. No cranial nerve deficit. Coordination normal.  Skin: Skin is warm and dry.   . Trace/mild edema bilateral knees down to foot. Minimal warmth RLE.  Psychiatric: He has a normal mood and affect. His behavior is normal. Judgment and thought content normal   Assessment/Plan: 1. Functional deficits secondary to endstage bilateral knee OA s/p bilateral TKA's which require 3+ hours per day of interdisciplinary therapy in a comprehensive inpatient rehab setting. Physiatrist is providing close team supervision and 24 hour management of active medical problems listed below. Physiatrist and rehab team continue to assess barriers to discharge/monitor patient progress toward functional and medical goals. FIM: FIM - Bathing Bathing Steps Patient Completed: Chest;Right Arm;Left Arm;Abdomen;Front perineal area;Buttocks;Right upper leg;Left upper leg;Right lower leg (including foot);Left lower leg (including foot) Bathing: 5: Supervision: Safety issues/verbal cues  FIM - Upper Body Dressing/Undressing Upper body dressing/undressing steps patient completed: Thread/unthread right sleeve of pullover shirt/dresss;Thread/unthread left sleeve of pullover shirt/dress;Put head through opening of pull over shirt/dress;Pull shirt over trunk Upper body dressing/undressing: 5: Set-up assist to: Obtain clothing/put away FIM - Lower Body Dressing/Undressing Lower body dressing/undressing steps patient completed: Thread/unthread right pants leg;Thread/unthread left pants leg;Pull pants up/down;Don/Doff right sock;Don/Doff left sock Lower body dressing/undressing: 4: Steadying Assist  FIM - Toileting Toileting steps completed by patient: Adjust clothing prior to toileting;Performs perineal hygiene;Adjust clothing after toileting Toileting: 0: Activity did not occur  FIM - Diplomatic Services operational officer Devices: Elevated toilet seat;Grab bars Toilet Transfers: 0-Activity did not occur  FIM - Banker Devices: Environmental consultant;Bed rails Bed/Chair  Transfer: 5: Supine > Sit: Supervision (verbal cues/safety issues);5: Bed > Chair or W/C: Supervision (verbal cues/safety issues)  FIM - Locomotion: Wheelchair Distance: 150 Locomotion: Wheelchair: 0: Activity did not occur FIM -  Locomotion: Ambulation Locomotion: Ambulation Assistive Devices: Walker - Rolling;Orthosis Ambulation/Gait Assistance: 5: Supervision Locomotion: Ambulation: 2: Travels 50 - 149 ft with supervision/safety issues  Comprehension Comprehension Mode: Auditory Comprehension: 7-Follows complex conversation/direction: With no assist  Expression Expression Mode: Verbal Expression: 7-Expresses complex ideas: With no assist  Social Interaction Social Interaction: 7-Interacts appropriately with others - No medications needed.  Problem Solving Problem Solving: 7-Solves complex problems: Recognizes & self-corrects  Memory Memory: 7-Complete Independence: No helper  Medical Problem List and Plan:  1. DVT Prophylaxis/Anticoagulation: Pharmaceutical: Coumadin and Lovenox  2. Pain Management: reports reasonable with prn meds.  3. Mood: Positive. LCSW to follow for formal evaluation.  4. Neuropsych: This patient is capable of making decisions on his/her own behalf.  5. ABLA: fe supplement  6. Hypothyroid: continue supplement  7. HTN: Monitor with bid checks. Continue Norvasc.  8. Chronic constipation: On stool softeners/ miralax daily. Finally had a bm yesterday 9. BPH: No voiding difficulties reported. Continue alfuzosin  LOS (Days) 5 A FACE TO FACE EVALUATION WAS PERFORMED  SWARTZ,ZACHARY T 01/04/2013 8:13 AM

## 2013-01-04 NOTE — Progress Notes (Signed)
Physical Therapy Session Note  Patient Details  Name: [name] MRN: [id number] Date of Birth: [birth year]  Today's Date: 01/04/2013 Time: 13:00-13:30 ( )  Short Term Goals: Week 1:  PT Short Term Goal 1 (Week 1): STGs=LTGs  Skilled Therapeutic Interventions/Progress Updates:    Tx focused on HEP for strengthening and ROM. Pt led through handout for form and reps to increase independence with exercises.  Pt performed each of the following bil 2x10:  Supine: ankle pumps, quad sets, heel slides with sheet assist, SAQ, SLR,  Seated: LAQ, flexion stretch with 10 sec hold each Toilet transfer and in room mobility with RW and close S. Pt needing cues only for increasing knee flexion with transfers. Supine<>sit with S only.   Therapy Documentation Precautions:  Precautions Precautions: Knee;Fall Required Braces or Orthoses: Knee Immobilizer - Right;Knee Immobilizer - Left Knee Immobilizer - Right:  (D/C 01/04/13) Knee Immobilizer - Left:  (D/C 01/03/13) Restrictions Weight Bearing Restrictions: Yes LLE Weight Bearing: Weight bearing as tolerated Other Position/Activity Restrictions: WBAT    Pain: Pain Assessment Pain Score:   1    Locomotion : Ambulation Ambulation/Gait Assistance: 5: Supervision   See FIM for current functional status  Therapy/Group: Individual Therapy Clydene Laming, PT, DPT 01/04/2013, 1:09 PM

## 2013-01-04 NOTE — Progress Notes (Signed)
Occupational Therapy Session Note  Patient Details  Name: [name] MRN: [id number] Date of Birth: 1951-06-10  Today's Date: 01/04/2013 Time: 1030-1125 Time Calculation (min): 55 min  Short Term Goals: Week 1:  OT Short Term Goal 1 (Week 1): Short Term Goals = Long Term Goals  Skilled Therapeutic Interventions/Progress Updates:  Patient found seated in w/c. Patient engaged in functional ambulation in room and bathroom for walk-in-shower transfer onto tub transfer bench using rolling walker and grab bars prn. Patient performed UB/LB bathing at mod I level using long handled sponge prn to increase independence. Patient ambulated into bedroom after shower for UB/LB dressing and grooming tasks seated at sink. Focused skilled intervention on functional mobility, ADL retraining, BLE management, sit/stands, dynamic standing balance/tolerance/endurance, and overall activity tolerance/endurance. Left patient seated in w/c with ice packs on bilateral knees and call bell & phone within reach.   Precautions:  Precautions Precautions: Knee;Fall Required Braces or Orthoses: Knee Immobilizer - Right;Knee Immobilizer - Left Knee Immobilizer - Right:  (D/C 01/04/13) Knee Immobilizer - Left:  (D/C 01/03/13) Restrictions Weight Bearing Restrictions: Yes LLE Weight Bearing: Weight bearing as tolerated Other Position/Activity Restrictions: WBAT  See FIM for current functional status  Therapy/Group: Individual Therapy  CLAY,PATRICIA 01/04/2013, 11:33 AM

## 2013-01-04 NOTE — Patient Care Conference (Signed)
Inpatient RehabilitationTeam Conference and Plan of Care Update Date: 01/04/2013   Time: 2:00 PM    Patient Name: [name]      Medical Record Number: [id number]  Date of Birth: 01/26/1951 Sex: Male         Room/Bed: 4004/4004-01 Payor Info: Payor: [employer]  Plan: [employer]  Product Type: *No Product type*     Admitting Diagnosis: BTKR  Admit Date/Time:  12/30/2012 10:22 PM Admission Comments: No comment available   Primary Diagnosis:  OA (osteoarthritis) of knee Principal Problem: OA (osteoarthritis) of knee  Patient Active Problem List   Diagnosis Date Noted  . Postop Hyponatremia 12/30/2012  . Postop Acute blood loss anemia 12/30/2012  . OA (osteoarthritis) of knee 12/27/2012  . BPH (benign prostatic hypertrophy)   . HYPOTHYROIDISM 09/04/2009  . DYSLIPIDEMIA 09/04/2009  . OBESITY 09/04/2009  . HYPERTENSION 09/04/2009  . ARTHRITIS 09/04/2009  . COLONIC POLYPS, HX OF 09/04/2009    Expected Discharge Date: Expected Discharge Date: 01/07/13  Team Members Present: Physician leading conference: Dr. Faith Rogue Social Worker Present: Dossie Der, LCSW Nurse Present: Daryll Brod, RN PT Present: Karolee Stamps, PT OT Present: Edwin Cap, Loistine Chance, OT SLP Present: Feliberto Gottron, SLP Other (Discipline and Name): Hilda Lias Noel=PPS Coordinator     Current Status/Progress Goal Weekly Team Focus  Medical   double tka's for endstage OA  pain control, increase strength rom  increase ROM   Bowel/Bladder   continent of bowel and bladder, independent with urinal                                                                                                                                                                                                                                                   min asssist  Monitor   Swallow/Nutrition/ Hydration             ADL's   overall mod I-> supervision  Mod I  d/c planning, functional mobility, BLE  managment, use of AE prn   Mobility   S to min A overall  mod I to S overall  ROM/strengthening, dynamic balance, fam ed, stairs   Communication             Safety/Cognition/ Behavioral Observations            Pain   Oxy  IR 10mg  q4hrs  < = 3  Monitor effectiveness and frequency   Skin   bilateral knee incisions with steri strips  no additional skin breakdown  Monitor for s/s of infection and appropriate healing q shift      *See Interdisciplinary Assessment and Plan and progress notes for long and short-term goals  Barriers to Discharge: none    Possible Resolutions to Barriers:  n/a    Discharge Planning/Teaching Needs:  Home with wife who is there during the day-works 7p-7a.  Pt reaching mod/i level goals.      Team Discussion:  Working toward mod/i level goals.  Knee range poor will need home CPM machine.  Wife to come in for education prior to discharge.  No other issues, doing well.  Revisions to Treatment Plan:  No barriers or change to treatment plan.   Continued Need for Acute Rehabilitation Level of Care: The patient requires daily medical management by a physician with specialized training in physical medicine and rehabilitation for the following conditions: Daily direction of a multidisciplinary physical rehabilitation program to ensure safe treatment while eliciting the highest outcome that is of practical value to the patient.: Yes Daily medical management of patient stability for increased activity during participation in an intensive rehabilitation regime.: Yes Daily analysis of laboratory values and/or radiology reports with any subsequent need for medication adjustment of medical intervention for : Post surgical problems (post-op anemia)  [name] 01/04/2013, 3:41 PM

## 2013-01-04 NOTE — Plan of Care (Signed)
Problem: RH PAIN MANAGEMENT Goal: RH STG PAIN MANAGED AT OR BELOW PT'S PAIN GOAL Less than 3  Outcome: Progressing Only requesting medication prior to therapy session. Reports pain as 1

## 2013-01-04 NOTE — Progress Notes (Signed)
Social Work Patient ID: [name], male   DOB: 1951-02-08, 62 y.o.   MRN: [id number] Met with pt and wife who was here and observed in PT.  To inform of team conference goals-mod/i level and discharge 1/10. Both feel pt will be ready and prepared to go home.  Discussed Home CPM and follow up OPPT.  Pursued coverage and aware will need to  Meet 1,000 deductible first before anything is covered.  Informed Home CPM is $25.oo a day along with a $ 75.00 charge for supplies.  Will see how does The next three days with knee flexion.  Want to go to MD's office due to closer to home and still in network.  Referral made to MD's office and appt made for Monday 1/13. Will continue to work toward discharge for Friday.

## 2013-01-04 NOTE — Telephone Encounter (Signed)
Want to inform md pt had bilateral knee replacement & need to be on coumadin therapy through 01/22/13. Wanting to see can md adjust & monitor coumadin...Raechel Chute

## 2013-01-04 NOTE — Progress Notes (Signed)
ANTICOAGULATION CONSULT NOTE - Follow Up Consult  Pharmacy Consult for coumadin Indication: VTE prophylaxis  No Known Allergies  Patient Measurements: Height: 6' (182.9 cm) Weight: 280 lb 3.3 oz (127.1 kg) IBW/kg (Calculated) : 77.6  Heparin Dosing Weight:   Vital Signs: Temp: 98.6 F (37 C) (01/07 0500) Temp src: Oral (01/07 0500) BP: 113/69 mmHg (01/07 0500) Pulse Rate: 72  (01/07 0500)  Labs:  Basename 01/04/13 0705 01/03/13 0530 01/02/13 0623  HGB -- -- --  HCT -- -- --  PLT -- -- --  APTT -- -- --  LABPROT 21.5* 20.0* 17.8*  INR 1.95* 1.77* 1.51*  HEPARINUNFRC -- -- --  CREATININE -- -- --  CKTOTAL -- -- --  CKMB -- -- --  TROPONINI -- -- --    Estimated Creatinine Clearance: 135.3 ml/min (by C-G formula based on Cr of 0.81).   Medications:  Scheduled:    . alfuzosin  10 mg Oral QHS  . amLODipine  5 mg Oral QAC breakfast  . enoxaparin (LOVENOX) injection  30 mg Subcutaneous Custom  . iron polysaccharides  150 mg Oral q12n4p  . levothyroxine  125 mcg Oral QAC breakfast  . polyethylene glycol  17 g Oral BID  . [COMPLETED] warfarin  7.5 mg Oral ONCE-1800  . warfarin  7.5 mg Oral ONCE-1800  . Warfarin - Pharmacist Dosing Inpatient   Does not apply q1800   Infusions:    Assessment: 62 yo male s/p TKA is currently on subtherapeutic coumadin.  INR today went up nicely to 1.95. Goal of Therapy:  INR 2-3    Plan:  1) Repeat coumadin 7.5mg  po x1 2) INR in am 3) If INR is >/= 2 tom, can d/c lovenox.  So, Tsz-Yin 01/04/2013,1:11 PM

## 2013-01-04 NOTE — Progress Notes (Signed)
Physical Therapy Session Note  Patient Details  Name: [name] MRN: [id number] Date of Birth: 07-Apr-1951  Today's Date: 01/04/2013 Time: 8295-6213 Time Calculation (min): 40 min  Short Term Goals: Week 1:  PT Short Term Goal 1 (Week 1): STGs=LTGs  Skilled Therapeutic Interventions/Progress Updates:    Patient's wife present for today's session. Today's session focused on B knee ROM, with emphasis on B knee flexion. Patient instructed in gait training, see details below, 105' x2 with rolling walker and supervision. Patient performed B LE there ex to facilitate improved B knee flexion. On elevated treatment mat, with B feet unsupported, patient performed active knee flexion with PT AAROM 10 x10" hold to promote increased knee flexion. Treatment mat lowered for B foot support and patient performed 10x10"hold heel slides with pillow case under each foot to promote ease of movement. Patient performed 4 sets x5 reps of standing mini squats with rolling walker for B UE support.  Patient returned to room and left supine in bed with wife present and all needs within reach.  Therapy Documentation Precautions:  Precautions Precautions: Knee;Fall Required Braces or Orthoses: Knee Immobilizer - Right;Knee Immobilizer - Left Knee Immobilizer - Right:  (D/C 01/04/13) Knee Immobilizer - Left:  (D/C 01/03/13) Restrictions Weight Bearing Restrictions: Yes LLE Weight Bearing: Weight bearing as tolerated Other Position/Activity Restrictions: WBAT B LE Pain: Pain Assessment Pain Assessment: No/denies pain Pain Score: 0-No pain Mobility: Bed Mobility Bed Mobility: Supine to Sit;Sit to Supine Supine to Sit: 6: Modified independent (Device/Increase time);With rails;HOB elevated Sit to Supine: 6: Modified independent (Device/Increase time);With rail;HOB elevated Transfers Sit to Stand: 4: Min assist;From bed;With armrests;With upper extremity assist Sit to Stand Details: Manual facilitation for weight  shifting;Verbal cues for precautions/safety Sit to Stand Details (indicate cue type and reason): Patient performs sit>stand with minimal B knee flexion. Stand to Sit: 4: Min assist;With upper extremity assist;With armrests;To bed Stand to Sit Details (indicate cue type and reason): Verbal cues for precautions/safety;Manual facilitation for weight shifting Locomotion : Ambulation Ambulation: Yes Ambulation/Gait Assistance: 5: Supervision Ambulation Distance (Feet): 105 Feet Assistive device: Rolling walker Ambulation/Gait Assistance Details: Verbal cues for gait pattern Ambulation/Gait Assistance Details: Patient requires verbal cues to increase B knee flexion during swing phase and increase plantarflexion during push off phase. Gait Gait: Yes Gait Pattern: Impaired Gait Pattern: Step-through pattern;Decreased stride length;Decreased hip/knee flexion - right;Decreased hip/knee flexion - left;Left hip hike;Wide base of support;Trunk flexed;Antalgic Wheelchair Mobility Distance: 0   See FIM for current functional status  Therapy/Group: Individual Therapy  Chipper Herb. Ripa, PT, DPT  01/04/2013, 4:05 PM

## 2013-01-04 NOTE — Progress Notes (Signed)
Physical Therapy Session Note  Patient Details  Name: [name] MRN: [id number] Date of Birth: 16-Jul-1951  Today's Date: 01/04/2013 Time: 0930-1026 Time Calculation (min): 56 min  Short Term Goals: Week 1:  PT Short Term Goal 1 (Week 1): STGs=LTGs  Skilled Therapeutic Interventions/Progress Updates:  Pt able to independently SLR on RLE today as well, discharged KI with pt in agreement. Gait on unit with S using RW for endurance and strengthening focusing on increasing knee flexion and more normalized gait pattern > 150'. Simulated car transfer with S; discussed options of reclining seat and sliding seat back to increase leg room. Stair training for home entry and to get to second floor in home x 12 steps with L rail only with S going up forwards and down sideways (leading with RLE). Nustep for functional ROM, strengthening, and endurance x 10 minutes (decreasing seat distance to increase ROM needed after 5 minutes (down to 13)) and final 5 minutes worked on holding the stretch each rotation to increase knee flexion.   Therapy Documentation Precautions:  Precautions Precautions: Knee;Fall Required Braces or Orthoses: Knee Immobilizer - Right;Knee Immobilizer - Left Knee Immobilizer - Right:  (D/C 01/04/13) Knee Immobilizer - Left:  (D/C 01/03/13) Restrictions Weight Bearing Restrictions: Yes LLE Weight Bearing: Weight bearing as tolerated Other Position/Activity Restrictions: WBAT    Pain:  Premedicated. No major complaints.   See FIM for current functional status  Therapy/Group: Individual Therapy  Karolee Stamps [facility] 01/04/2013, 10:26 AM

## 2013-01-05 ENCOUNTER — Inpatient Hospital Stay (HOSPITAL_COMMUNITY): Payer: 59 | Admitting: Physical Therapy

## 2013-01-05 ENCOUNTER — Inpatient Hospital Stay (HOSPITAL_COMMUNITY): Payer: 59 | Admitting: Occupational Therapy

## 2013-01-05 ENCOUNTER — Inpatient Hospital Stay (HOSPITAL_COMMUNITY): Payer: 59

## 2013-01-05 DIAGNOSIS — M1710 Unilateral primary osteoarthritis, unspecified knee: Secondary | ICD-10-CM

## 2013-01-05 DIAGNOSIS — D62 Acute posthemorrhagic anemia: Secondary | ICD-10-CM

## 2013-01-05 DIAGNOSIS — Z96659 Presence of unspecified artificial knee joint: Secondary | ICD-10-CM

## 2013-01-05 LAB — PROTIME-INR
INR: 2.2 — ABNORMAL HIGH (ref 0.00–1.49)
Prothrombin Time: 23.5 seconds — ABNORMAL HIGH (ref 11.6–15.2)

## 2013-01-05 MED ORDER — WARFARIN SODIUM 7.5 MG PO TABS
7.5000 mg | ORAL_TABLET | Freq: Once | ORAL | Status: AC
  Administered 2013-01-05: 7.5 mg via ORAL
  Filled 2013-01-05: qty 1

## 2013-01-05 MED ORDER — POLYETHYLENE GLYCOL 3350 17 G PO PACK
17.0000 g | PACK | Freq: Three times a day (TID) | ORAL | Status: DC
  Administered 2013-01-06 – 2013-01-07 (×2): 17 g via ORAL
  Filled 2013-01-05 (×7): qty 1

## 2013-01-05 NOTE — Telephone Encounter (Signed)
Pt already been set-up to come see cindy to have coumadin check...lmb

## 2013-01-05 NOTE — Telephone Encounter (Signed)
Yes - i can oversee thru CC - please schedule appt with Arline Asp as needed

## 2013-01-05 NOTE — Progress Notes (Signed)
Social Work Patient ID: [name], male   DOB: 03-28-1951, 62 y.o.   MRN: [id number] Insurance updated faxed to [facility] Case manager await authorization.

## 2013-01-05 NOTE — Progress Notes (Signed)
Physical Therapy Session Note  Patient Details  Name: [name] MRN: [id number] Date of Birth: 1951-01-02  Today's Date: 01/05/2013 Time: 1400-1445 Time Calculation (min): 45 min  Short Term Goals: Week 1:  PT Short Term Goal 1 (Week 1): STGs=LTGs  Skilled Therapeutic Interventions/Progress Updates:    Focus of session on family education with pt's wife for stairs (up/down flight with L rail with S) and generalized safety/home recommendations. Wife reports she is picking up the walker today, so no DME will need to be ordered for PT.  Nustep for ROM/strengthening x 10 minutes with emphasis on knee flexion and stretching. Gait on unit for endurance and strengthening. Ice applied end of session to bilateral knees.  Therapy Documentation Precautions:  Precautions Precautions: Knee;Fall Required Braces or Orthoses: Knee Immobilizer - Right;Knee Immobilizer - Left Knee Immobilizer - Right:  (D/C 01/04/13) Knee Immobilizer - Left:  (D/C 01/03/13) Restrictions Weight Bearing Restrictions: No LLE Weight Bearing: Weight bearing as tolerated Other Position/Activity Restrictions: WBAT B LE   Pain: Denies pain. Locomotion : Ambulation Ambulation/Gait Assistance: 5: Supervision   See FIM for current functional status  Therapy/Group: Individual Therapy  Karolee Stamps [name] 01/05/2013, 4:05 PM

## 2013-01-05 NOTE — Progress Notes (Signed)
Physical Therapy Session Note  Patient Details  Name: THOS MATSUMOTO MRN: [id number] Date of Birth: 02/23/1951  Today's Date: 01/05/2013 Time: 0930-1030 Time Calculation (min): 60 min  Short Term Goals: Week 1:  PT Short Term Goal 1 (Week 1): STGs=LTGs  Skilled Therapeutic Interventions/Progress Updates:    Reviewed HEP for knee ROM/strengthening including heel slides with 10 second hold (using sheet to assist motion), SAQ, SLR, and seated knee flexion with mat elevated all 10 reps x 2 sets each bilaterally. Practiced sit to stands with emphasis on allowing knees to flexion for more normal movement pattern x 10 reps. Stair negotiation with L rail only with S for home environment training. Practiced household gait in ADL apartment and bed mobility/transfers from ADL apartment bed overall mod I/S. Ice packs applied to bilateral LE's end of session.  Therapy Documentation Precautions:  Precautions Precautions: Knee;Fall Required Braces or Orthoses: Knee Immobilizer - Right;Knee Immobilizer - Left Knee Immobilizer - Right:  (D/C 01/04/13) Knee Immobilizer - Left:  (D/C 01/03/13) Restrictions Weight Bearing Restrictions: No LLE Weight Bearing: Weight bearing as tolerated Other Position/Activity Restrictions: WBAT B LE   Pain: A little sore but overall pain is managed.  See FIM for current functional status  Therapy/Group: Individual Therapy  Karolee Stamps [facility] 01/05/2013, 12:09 PM

## 2013-01-05 NOTE — Progress Notes (Signed)
Subjective/Complaints: Doing well. Motivated to work with therapy.  A 12 point review of systems has been performed and if not noted above is otherwise negative.   Objective: Vital Signs: Blood pressure 123/77, pulse 70, temperature 98 F (36.7 C), temperature source Oral, resp. rate 19, height 6' (1.829 m), weight 127.1 kg (280 lb 3.3 oz), SpO2 92.00%. No results found. No results found for this basename: WBC:2,HGB:2,HCT:2,PLT:2 in the last 72 hours No results found for this basename: NA:2,K:2,CL:2,CO:2,GLUCOSE:2,BUN:2,CREATININE:2,CALCIUM:2 in the last 72 hours CBG (last 3)  No results found for this basename: GLUCAP:3 in the last 72 hours  Wt Readings from Last 3 Encounters:  12/30/12 127.1 kg (280 lb 3.3 oz)  12/27/12 124.286 kg (274 lb)  12/27/12 124.286 kg (274 lb)    Physical Exam:  Nursing note and vitals reviewed.  Constitutional: He is oriented to person, place, and time. He appears well-developed and well-nourished.  HENT:  Head: Normocephalic and atraumatic.  Right Ear: External ear normal.  Left Ear: External ear normal.  Eyes: Conjunctivae normal and EOM are normal. Pupils are equal, round, and reactive to light.  Neck: Normal range of motion. Neck supple. No JVD present. No tracheal deviation present. No thyromegaly present.  Cardiovascular: Normal rate, regular rhythm and normal heart sounds.  No murmur heard.  Pulmonary/Chest: Effort normal and breath sounds normal. No respiratory distress. He has no wheezes.  Abdominal: Soft. Bowel sounds are normal. He exhibits no distension. There is no tenderness.  Musculoskeletal: He exhibits decreased edema and tenderness (with ROM bilateral knees. ).  Knee incisions clean and intact with no drainage. Legs are appropriately tender  Lymphadenopathy:  He has no cervical adenopathy.  Neurological: He is alert and oriented to person, place, and time. No cranial nerve deficit. Coordination normal.  Skin: Skin is warm and  dry.  . Trace/mild edema bilateral knees down to foot. Minimal warmth RLE.  Psychiatric: He has a normal mood and affect. His behavior is normal. Judgment and thought content normal   Assessment/Plan: 1. Functional deficits secondary to endstage bilateral knee OA s/p bilateral TKA's which require 3+ hours per day of interdisciplinary therapy in a comprehensive inpatient rehab setting. Physiatrist is providing close team supervision and 24 hour management of active medical problems listed below. Physiatrist and rehab team continue to assess barriers to discharge/monitor patient progress toward functional and medical goals. FIM: FIM - Bathing Bathing Steps Patient Completed: Chest;Right Arm;Left Arm;Abdomen;Front perineal area;Buttocks;Right upper leg;Left upper leg;Right lower leg (including foot);Left lower leg (including foot) Bathing: 6: Assistive device (Comment)  FIM - Upper Body Dressing/Undressing Upper body dressing/undressing steps patient completed: Thread/unthread left sleeve of pullover shirt/dress;Thread/unthread right sleeve of pullover shirt/dresss;Put head through opening of pull over shirt/dress;Pull shirt over trunk Upper body dressing/undressing: 7: Complete Independence: No helper FIM - Lower Body Dressing/Undressing Lower body dressing/undressing steps patient completed: Thread/unthread right pants leg;Thread/unthread left pants leg;Pull pants up/down;Don/Doff right shoe;Don/Doff left shoe Lower body dressing/undressing: 4: Steadying Assist  FIM - Toileting Toileting steps completed by patient: Adjust clothing prior to toileting;Performs perineal hygiene;Adjust clothing after toileting Toileting: 0: Activity did not occur  FIM - Diplomatic Services operational officer Devices: Elevated toilet seat;Grab bars Toilet Transfers: 0-Activity did not occur  FIM - Banker Devices: Bed rails;Walker Bed/Chair Transfer: 6: Assistive  device: no helper  FIM - Locomotion: Wheelchair Distance: 0 Locomotion: Wheelchair: 0: Activity did not occur FIM - Locomotion: Ambulation Locomotion: Ambulation Assistive Devices: Designer, industrial/product Ambulation/Gait Assistance: 5: Supervision Locomotion:  Ambulation: 2: Travels 50 - 149 ft with supervision/safety issues  Comprehension Comprehension Mode: Auditory Comprehension: 7-Follows complex conversation/direction: With no assist  Expression Expression Mode: Verbal Expression: 7-Expresses complex ideas: With no assist  Social Interaction Social Interaction: 7-Interacts appropriately with others - No medications needed.  Problem Solving Problem Solving: 7-Solves complex problems: Recognizes & self-corrects  Memory Memory: 7-Complete Independence: No helper  Medical Problem List and Plan:  1. DVT Prophylaxis/Anticoagulation: Pharmaceutical: Coumadin and Lovenox  2. Pain Management: reports reasonable with prn meds.  3. Mood: Positive. LCSW to follow for formal evaluation.  4. Neuropsych: This patient is capable of making decisions on his/her own behalf.  5. ABLA: fe supplement  6. Hypothyroid: continue supplement  7. HTN: Monitor with bid checks. Continue Norvasc.  8. Chronic constipation: On stool softeners/ miralax daily.   9. BPH: No voiding difficulties reported. Continue alfuzosin  LOS (Days) 6 A FACE TO FACE EVALUATION WAS PERFORMED  SWARTZ,ZACHARY T 01/05/2013 10:48 AM

## 2013-01-05 NOTE — Progress Notes (Signed)
Physical Therapy Session Note  Patient Details  Name: [name] MRN: [id number] Date of Birth: 10-12-1951  Today's Date: 01/05/2013 Time: 1300-1324 Time Calculation (min): 24 min  Short Term Goals: Week 1:  PT Short Term Goal 1 (Week 1): STGs=LTGs  Skilled Therapeutic Interventions/Progress Updates:   Pt requested to work on walking.  Gait on unit 300' x 2 with RW with supervision.  Car transfer with supervision.  Sit to stand transfers with supervision.   Therapy Documentation Precautions:  Precautions Precautions: Knee;Fall Required Braces or Orthoses: Knee Immobilizer - Right;Knee Immobilizer - Left Knee Immobilizer - Right:  (D/C 01/04/13) Knee Immobilizer - Left:  (D/C 01/03/13) Restrictions Weight Bearing Restrictions: No LLE Weight Bearing: Weight bearing as tolerated Other Position/Activity Restrictions: WBAT B LE Pain: Pain Assessment Pain Assessment: No/denies pain Locomotion : Ambulation Ambulation/Gait Assistance: 5: Supervision   See FIM for current functional status  Therapy/Group: Individual Therapy  Georges Mouse 01/05/2013, 1:27 PM

## 2013-01-05 NOTE — Progress Notes (Signed)
ANTICOAGULATION CONSULT NOTE - Follow Up Consult  Pharmacy Consult for coumadin Indication: VTE prophylaxis  No Known Allergies  Patient Measurements: Height: 6' (182.9 cm) Weight: 280 lb 3.3 oz (127.1 kg) IBW/kg (Calculated) : 77.6  Heparin Dosing Weight:   Vital Signs: Temp: 98 F (36.7 C) (01/08 0500) Temp src: Oral (01/08 0500) BP: 123/77 mmHg (01/08 0500) Pulse Rate: 70  (01/08 0500)  Labs:  Basename 01/05/13 0630 01/04/13 0705 01/03/13 0530  HGB -- -- --  HCT -- -- --  PLT -- -- --  APTT -- -- --  LABPROT 23.5* 21.5* 20.0*  INR 2.20* 1.95* 1.77*  HEPARINUNFRC -- -- --  CREATININE -- -- --  CKTOTAL -- -- --  CKMB -- -- --  TROPONINI -- -- --    Estimated Creatinine Clearance: 135.3 ml/min (by C-G formula based on Cr of 0.81).   Medications:  Scheduled:     . alfuzosin  10 mg Oral QHS  . amLODipine  5 mg Oral QAC breakfast  . enoxaparin (LOVENOX) injection  30 mg Subcutaneous Custom  . iron polysaccharides  150 mg Oral q12n4p  . levothyroxine  125 mcg Oral QAC breakfast  . polyethylene glycol  17 g Oral BID  . [COMPLETED] warfarin  7.5 mg Oral ONCE-1800  . Warfarin - Pharmacist Dosing Inpatient   Does not apply q1800   Infusions:    Assessment: 62 yo male s/p TKA is currently on lovenox and coumadin for VTE prophylaxis.  INR today went up nicely to 2.2.  Goal of Therapy:  INR 2-3    Plan:  1) Repeat coumadin 7.5mg  po x1 2) INR in am 3) d/c lovenox.  Bayard Hugger, PharmD, BCPS  Clinical Pharmacist  Pager: [phone]  01/05/2013,1:48 PM

## 2013-01-05 NOTE — Progress Notes (Signed)
Occupational Therapy Session Note  Patient Details  Name: [name] MRN: [id number] Date of Birth: 1951-04-21  Today's Date: 01/05/2013 Time: 0830-0925 Time Calculation (min): 55 min  Short Term Goals: Week 1:  OT Short Term Goal 1 (Week 1): Short Term Goals = Long Term Goals  Skilled Therapeutic Interventions/Progress Updates:  Patient found supine in bed. Engaged in bed mobility for functional ambulation throughout room using rolling walker -> shower stall transfer onto shower seat in walk-in shower. Focused skilled intervention on functional mobility using rolling walker for simple house keeping task, UB/LB bathing at shower level, UB/LB dressing in standing position using reacher prn, sit/stands from various surfaces, grooming tasks in sit & stand position at sink, and overall activity tolerance/endurance. At end of session patient ambulated from room -> therapy gym using rolling walker. Therapist administered elastic shoe strings to patient to increase independence with donning bilateral shoes. Patient left seated on therapy mat in gym for next therapy session.   Precautions:  Precautions Precautions: Knee;Fall Required Braces or Orthoses: Knee Immobilizer - Right;Knee Immobilizer - Left Knee Immobilizer - Right:  (D/C 01/04/13) Knee Immobilizer - Left:  (D/C 01/03/13) Restrictions Weight Bearing Restrictions: Yes LLE Weight Bearing: Weight bearing as tolerated Other Position/Activity Restrictions: WBAT B LE  See FIM for current functional status  Therapy/Group: Individual Therapy  CLAY,PATRICIA 01/05/2013, 9:26 AM

## 2013-01-06 ENCOUNTER — Inpatient Hospital Stay (HOSPITAL_COMMUNITY): Payer: 59 | Admitting: *Deleted

## 2013-01-06 ENCOUNTER — Inpatient Hospital Stay (HOSPITAL_COMMUNITY): Payer: 59

## 2013-01-06 ENCOUNTER — Inpatient Hospital Stay (HOSPITAL_COMMUNITY): Payer: 59 | Admitting: Occupational Therapy

## 2013-01-06 LAB — PROTIME-INR
INR: 2.47 — ABNORMAL HIGH (ref 0.00–1.49)
Prothrombin Time: 25.6 seconds — ABNORMAL HIGH (ref 11.6–15.2)

## 2013-01-06 MED ORDER — WARFARIN SODIUM 7.5 MG PO TABS
7.5000 mg | ORAL_TABLET | Freq: Once | ORAL | Status: AC
  Administered 2013-01-06: 7.5 mg via ORAL
  Filled 2013-01-06: qty 1

## 2013-01-06 NOTE — Progress Notes (Signed)
Subjective/Complaints: Up walking in room after going to BR. No complaints  A 12 point review of systems has been performed and if not noted above is otherwise negative.   Objective: Vital Signs: Blood pressure 137/66, pulse 70, temperature 98.5 F (36.9 C), temperature source Oral, resp. rate 19, height 6' (1.829 m), weight 127.1 kg (280 lb 3.3 oz), SpO2 94.00%. No results found. No results found for this basename: WBC:2,HGB:2,HCT:2,PLT:2 in the last 72 hours No results found for this basename: NA:2,K:2,CL:2,CO:2,GLUCOSE:2,BUN:2,CREATININE:2,CALCIUM:2 in the last 72 hours CBG (last 3)  No results found for this basename: GLUCAP:3 in the last 72 hours  Wt Readings from Last 3 Encounters:  12/30/12 127.1 kg (280 lb 3.3 oz)  12/27/12 124.286 kg (274 lb)  12/27/12 124.286 kg (274 lb)    Physical Exam:  Nursing note and vitals reviewed.  Constitutional: He is oriented to person, place, and time. He appears well-developed and well-nourished.  HENT:  Head: Normocephalic and atraumatic.  Right Ear: External ear normal.  Left Ear: External ear normal.  Eyes: Conjunctivae normal and EOM are normal. Pupils are equal, round, and reactive to light.  Neck: Normal range of motion. Neck supple. No JVD present. No tracheal deviation present. No thyromegaly present.  Cardiovascular: Normal rate, regular rhythm and normal heart sounds.  No murmur heard.  Pulmonary/Chest: Effort normal and breath sounds normal. No respiratory distress. He has no wheezes.  Abdominal: Soft. Bowel sounds are normal. He exhibits no distension. There is no tenderness.  Musculoskeletal: He exhibits decreased edema and tenderness (with ROM bilateral knees. ).  Knee incisions clean and intact with no drainage. Legs are appropriately tender  Lymphadenopathy:  He has no cervical adenopathy.  Neurological: He is alert and oriented to person, place, and time. No cranial nerve deficit. Coordination normal.  Skin: Skin is  warm and dry.  . Trace/mild edema bilateral knees down to foot. Minimal warmth RLE.  Psychiatric: He has a normal mood and affect. His behavior is normal. Judgment and thought content normal   Assessment/Plan: 1. Functional deficits secondary to endstage bilateral knee OA s/p bilateral TKA's which require 3+ hours per day of interdisciplinary therapy in a comprehensive inpatient rehab setting. Physiatrist is providing close team supervision and 24 hour management of active medical problems listed below. Physiatrist and rehab team continue to assess barriers to discharge/monitor patient progress toward functional and medical goals.  STILL DOESN'T HAVE 90 DEGREES OF KNEE FLEXION. NEEDS HOME CPM   FIM: FIM - Bathing Bathing Steps Patient Completed: Chest;Right Arm;Left Arm;Abdomen;Front perineal area;Buttocks;Right upper leg;Left upper leg;Right lower leg (including foot);Left lower leg (including foot) Bathing: 6: Assistive device (Comment)  FIM - Upper Body Dressing/Undressing Upper body dressing/undressing steps patient completed: Thread/unthread left sleeve of pullover shirt/dress;Thread/unthread right sleeve of pullover shirt/dresss;Put head through opening of pull over shirt/dress;Pull shirt over trunk Upper body dressing/undressing: 7: Complete Independence: No helper FIM - Lower Body Dressing/Undressing Lower body dressing/undressing steps patient completed: Thread/unthread right pants leg;Thread/unthread left pants leg;Pull pants up/down;Don/Doff right shoe;Don/Doff left shoe Lower body dressing/undressing: 4: Steadying Assist  FIM - Toileting Toileting steps completed by patient: Adjust clothing prior to toileting;Performs perineal hygiene;Adjust clothing after toileting Toileting: 0: Activity did not occur  FIM - Diplomatic Services operational officer Devices: Elevated toilet seat;Grab bars Toilet Transfers: 0-Activity did not occur  FIM - Event organiser Devices: Therapist, occupational: 6: Assistive device: no helper  FIM - Locomotion: Wheelchair Distance: 0 Locomotion: Wheelchair: 0: Activity did not occur  FIM - Locomotion: Ambulation Locomotion: Ambulation Assistive Devices: Designer, industrial/product Ambulation/Gait Assistance: 5: Supervision Locomotion: Ambulation: 5: Travels 150 ft or more with supervision/safety issues  Comprehension Comprehension Mode: Auditory Comprehension: 7-Follows complex conversation/direction: With no assist  Expression Expression Mode: Verbal Expression: 7-Expresses complex ideas: With no assist  Social Interaction Social Interaction: 7-Interacts appropriately with others - No medications needed.  Problem Solving Problem Solving: 7-Solves complex problems: Recognizes & self-corrects  Memory Memory: 7-Complete Independence: No helper  Medical Problem List and Plan:  1. DVT Prophylaxis/Anticoagulation: Pharmaceutical: Coumadin and Lovenox  2. Pain Management: reports reasonable with prn meds.  3. Mood: Positive. LCSW to follow for formal evaluation.  4. Neuropsych: This patient is capable of making decisions on his/her own behalf.  5. ABLA: fe supplement  6. Hypothyroid: continue supplement  7. HTN: Monitor with bid checks. Continue Norvasc.  8. Chronic constipation: On stool softeners/ miralax daily.   9. BPH: No voiding difficulties reported. Continue alfuzosin  LOS (Days) 7 A FACE TO FACE EVALUATION WAS PERFORMED  SWARTZ,ZACHARY T 01/06/2013 7:33 AM

## 2013-01-06 NOTE — Progress Notes (Signed)
ANTICOAGULATION CONSULT NOTE - Follow Up Consult  Pharmacy Consult for coumadin Indication: VTE prophylaxis  No Known Allergies  Patient Measurements: Height: 6' (182.9 cm) Weight: 276 lb 8 oz (125.42 kg) IBW/kg (Calculated) : 77.6  Heparin Dosing Weight:   Vital Signs: Temp: 98.5 F (36.9 C) (01/09 0500) Temp src: Oral (01/09 0500) BP: 137/66 mmHg (01/09 0500) Pulse Rate: 70  (01/09 0500)  Labs:  Basename 01/06/13 0630 01/05/13 0630 01/04/13 0705  HGB -- -- --  HCT -- -- --  PLT -- -- --  APTT -- -- --  LABPROT 25.6* 23.5* 21.5*  INR 2.47* 2.20* 1.95*  HEPARINUNFRC -- -- --  CREATININE -- -- --  CKTOTAL -- -- --  CKMB -- -- --  TROPONINI -- -- --    Estimated Creatinine Clearance: 134.3 ml/min (by C-G formula based on Cr of 0.81).   Medications:  Scheduled:     . alfuzosin  10 mg Oral QHS  . amLODipine  5 mg Oral QAC breakfast  . iron polysaccharides  150 mg Oral q12n4p  . levothyroxine  125 mcg Oral QAC breakfast  . polyethylene glycol  17 g Oral TID AC  . [COMPLETED] warfarin  7.5 mg Oral ONCE-1800  . Warfarin - Pharmacist Dosing Inpatient   Does not apply q1800  . [DISCONTINUED] polyethylene glycol  17 g Oral BID    Assessment: 62 yo male s/p TKA is currently on lovenox and coumadin for VTE prophylaxis.  INR at goal=2.47.   Goal of Therapy:  INR 2-3    Plan:  Repeat Coumadin 7.5mg  po x1 dose today.   Daily PT/INR.  Wendie Simmer, PharmD, BCPS Clinical Pharmacist  Pager: [phone]

## 2013-01-06 NOTE — Progress Notes (Signed)
Physical Therapy Note  Patient Details  Name: [name] MRN: [id number] Date of Birth: [birth year] Today's Date: 01/06/2013  1300-1355 (55 minutes) group Pain: no complaint of pain Pt participated in PT group session focused on gait training, safety, endurance; Nustep Level 4 X 20 minutes to facilitate increased AROM bilateral knee flexion; gait 150 feet X 2 RW with vcs to maintain cadence vs stop/start.   HOUT,JIM 01/06/2013, 7:43 AM

## 2013-01-06 NOTE — Progress Notes (Signed)
Physical Therapy Discharge Summary  Patient Details  Name: [name] MRN: [id number] Date of Birth: 12/08/1951  Today's Date: 01/06/2013  Time: 1430-1500 (30 min) Individual therapy; No pain complaints (ice applied prior to session and at end of session for pain/edema management). Focused session on final d/c assessment, up/down flight of stairs for home negotiation to second floor with L rail with S, gait on unit mod I and in home environment, and assessing LE ROM and active assisted knee flexion exercises on mat in supine. Pt declined making up time missed earlier as he feels prepared for d/c and gave pt clearance to walk on unit mod I with RW later for increased activity tolerance.   Patient has met 9 of 9 long term goals due to improved activity tolerance, improved balance, increased strength, increased range of motion, decreased pain and ability to compensate for deficits.  Patient to discharge at an ambulatory level Modified Independent using RW.  Patient's wife is independent to provide the necessary supervision assistance for stairs at discharge. Pt's wife successfully completed family education.  Reasons goals not met: n/a  Recommendation:  Patient will benefit from ongoing skilled PT services in outpatient setting to continue to advance safe functional mobility, address ongoing impairments in ROM, strength, activity tolerance, gait, and minimize fall risk.  Equipment: No equipment provided  Reasons for discharge: treatment goals met and discharge from hospital  Patient/family agrees with progress made and goals achieved: Yes  PT Discharge Precautions/Restrictions Precautions Precautions: Knee;Fall Precaution Comments: knee immobilizers discharged Restrictions Weight Bearing Restrictions: Yes LLE Weight Bearing: Weight bearing as tolerated Other Position/Activity Restrictions: WBAT B LE   Vision/Perception  Vision - History Baseline Vision: Wears glasses all the  time Patient Visual Report: No change from baseline Vision - Assessment Eye Alignment: Within Functional Limits Vision Assessment: Vision not tested Perception Perception: Within Functional Limits Praxis Praxis: Intact  Cognition Overall Cognitive Status: Appears within functional limits for tasks assessed Arousal/Alertness: Awake/alert Orientation Level: Oriented X4 Memory: Appears intact Awareness: Appears intact Problem Solving: Appears intact Safety/Judgment: Appears intact Sensation Sensation Light Touch: Appears Intact Proprioception: Appears Intact Additional Comments: bilateral UEs appear intact Coordination Gross Motor Movements are Fluid and Coordinated: Yes Fine Motor Movements are Fluid and Coordinated: Yes Motor  Motor Motor: Within Functional Limits    Locomotion  Ambulation Ambulation/Gait Assistance: 6: Modified independent (Device/Increase time) Gait Gait Pattern: Impaired Gait Pattern: Decreased hip/knee flexion - right;Decreased hip/knee flexion - left  Trunk/Postural Assessment  Cervical Assessment Cervical Assessment: Within Functional Limits Thoracic Assessment Thoracic Assessment: Within Functional Limits Lumbar Assessment Lumbar Assessment: Within Functional Limits Postural Control Postural Control: Within Functional Limits  Balance Balance Balance Assessed: Yes Static Sitting Balance Static Sitting - Level of Assistance: 7: Independent Dynamic Sitting Balance Dynamic Sitting - Level of Assistance: 6: Modified independent (Device/Increase time) Static Standing Balance Static Standing - Level of Assistance: 6: Modified independent (Device/Increase time) Dynamic Standing Balance Dynamic Standing - Level of Assistance: 6: Modified independent (Device/Increase time) Extremity Assessment  RUE Assessment RUE Assessment: Within Functional Limits LUE Assessment LUE Assessment: Within Functional Limits RLE Assessment RLE Assessment:  Exceptions to [facility] RLE Strength RLE Overall Strength Comments: 77 degress knee flexion in sitting, 68 deg in supine; -8 deg extension; strength 5/5 ankle, knee 3+/5 in available limited range, hip 4-/5 LLE Assessment LLE Assessment: Exceptions to [facility] LLE Strength LLE Overall Strength Comments: 71 degress knee flexion in sitting, 64 deg in supine; -10 deg extension; strength 5/5 ankle, knee 3+/5 in available  limited range, hip 4-/5  See FIM for current functional status  Karolee Stamps [facility] 01/06/2013, 4:28 PM

## 2013-01-06 NOTE — Progress Notes (Signed)
Social Work Patient ID: RAS KOLLMAN, male   DOB: 1951-12-14, 62 y.o.   MRN: [id number] Met with pt who reports he and wife want to wait on the home CPM and see how he does with OP therapies.  [facility] and they can monitor and arrange a home CPM if they feel it is warranted.  Pt also request his prescriptions be called into  [facility].  Dan-PA aware and has the number.  Pt feels prepared for discharge tomorrow.

## 2013-01-06 NOTE — Progress Notes (Signed)
Occupational Therapy Session Note & Discharge Summary  Patient Details  Name: [name] MRN: [id number] Date of Birth: [birth year]  Today's Date: 01/06/2013  SESSION NOTE  [id number] - 45 Minutes Patient missed 15 minutes of skilled therapy, see below. Individual Therapy No complaints of pain Patient found supine in bed. Patient engaged in bed mobility and then ambulated from bed throughout room to gather necessary items for ADL, then performed ADL at shower level. Patient performing at an overall modified independent level for aspects of daily living. Patient with good safety awareness with use of rolling walker. After shower patient with bleeding sacrum secondary to "scrubbing too hard", notified RN and left patient seated on shower seat; patient missed 15 minutes of OT.   ---------------------------------------------------------------------------------------------  DISCHARGE SUMMARY  Patient has met 11 of 11 long term goals due to improved activity tolerance, improved balance, postural control, ability to compensate for deficits, functional use of  RIGHT lower and LEFT lower extremity, improved attention, improved awareness and improved coordination. Patient has made great progress during CIR stay. Patient demonstrates safe and effective functional mobility, transfers, BADLs, and IADLs. Patient to discharge at overall Modified Independent level.    Reasons goals not met: n/a, all goals met at this time.  Recommendation: No additional occupational therapy recommended at this time.   Equipment: No equipment provided Patient states he has handicap height toilet seats and will purchase a shower seat on his own.   Reasons for discharge: treatment goals met and discharge from hospital  Patient/family agrees with progress made and goals achieved: Yes  Precautions/Restrictions  Precautions Precautions: Knee;Fall Precaution Comments: knee immobilizers discharged Restrictions Weight  Bearing Restrictions: Yes LLE Weight Bearing: Weight bearing as tolerated  ADL - See FIM  Vision/Perception  Vision - History Baseline Vision: Wears glasses all the time Patient Visual Report: No change from baseline Vision - Assessment Eye Alignment: Within Functional Limits Vision Assessment: Vision not tested Perception Perception: Within Functional Limits Praxis Praxis: Intact   Cognition Overall Cognitive Status: Appears within functional limits for tasks assessed Arousal/Alertness: Awake/alert Orientation Level: Oriented X4 Memory: Appears intact Awareness: Appears intact Problem Solving: Appears intact Safety/Judgment: Appears intact  Sensation Sensation Additional Comments: bilateral UEs appear intact Coordination Gross Motor Movements are Fluid and Coordinated: Yes Fine Motor Movements are Fluid and Coordinated: Yes  Motor - See Discharge Navigator  Trunk/Postural Assessment - See Discharge Navigator  Balance - See Discharge Naviagtor  Extremity/Trunk Assessment RUE Assessment RUE Assessment: Within Functional Limits LUE Assessment LUE Assessment: Within Functional Limits  See FIM for current functional status  CLAY,PATRICIA 01/06/2013, 9:52 AM

## 2013-01-06 NOTE — Progress Notes (Signed)
Physical Therapy Note  Patient Details  Name: [name] MRN: [id number] Date of Birth: [birth year] Today's Date: 01/06/2013  Pt missed 60 minutes of skilled PT session due to bleeding episode (groin area) that occurred during ADL. Pt reported that it still had not yet completely resolved (RN addressing) and pt uncomfortable with doing therapy until it stopped completely. Therapist came back 30 minutes and still issue not fully resolved and pt declined. Unable to shift schedule at this time to accommodate missed time. Plan to see pt at 230 for scheduled session and will try to see again later in afternoon if able. Pt very agreeable.  Karolee Stamps [facility] 01/06/2013, 11:46 AM

## 2013-01-07 LAB — PROTIME-INR
INR: 2.77 — ABNORMAL HIGH (ref 0.00–1.49)
Prothrombin Time: 27.9 seconds — ABNORMAL HIGH (ref 11.6–15.2)

## 2013-01-07 MED ORDER — OXYCODONE HCL 5 MG PO TABS: 5.0000 mg | ORAL_TABLET | ORAL | Status: DC | PRN

## 2013-01-07 MED ORDER — POLYSACCHARIDE IRON COMPLEX 150 MG PO CAPS: 150.0000 mg | ORAL_CAPSULE | Freq: Two times a day (BID) | ORAL | Status: DC

## 2013-01-07 MED ORDER — WARFARIN SODIUM 5 MG PO TABS
5.0000 mg | ORAL_TABLET | Freq: Every day | ORAL | Status: DC
  Filled 2013-01-07: qty 1

## 2013-01-07 MED ORDER — WARFARIN SODIUM 5 MG PO TABS: 5.0000 mg | ORAL_TABLET | Freq: Once | ORAL | Status: DC

## 2013-01-07 MED ORDER — METHOCARBAMOL 500 MG PO TABS: 500.0000 mg | ORAL_TABLET | Freq: Four times a day (QID) | ORAL | Status: DC | PRN

## 2013-01-07 NOTE — Progress Notes (Signed)
ANTICOAGULATION CONSULT NOTE - Follow Up Consult  Pharmacy Consult for coumadin Indication: VTE prophylaxis  No Known Allergies  Labs:  Basename 01/07/13 0610 01/06/13 0630 01/05/13 0630  HGB -- -- --  HCT -- -- --  PLT -- -- --  APTT -- -- --  LABPROT 27.9* 25.6* 23.5*  INR 2.77* 2.47* 2.20*  HEPARINUNFRC -- -- --  CREATININE -- -- --  CKTOTAL -- -- --  CKMB -- -- --  TROPONINI -- -- --    Estimated Creatinine Clearance: 134.3 ml/min (by C-G formula based on Cr of 0.81).   Assessment: 62 yo male s/p TKA is currently on lovenox and coumadin for VTE prophylaxis.  INR at goal=2.77   Goal of Therapy:  INR 2-3   Plan:  Coumadin 5 mg po daily at 1800 pm.   Daily PT/INR.  Thank you. Okey Regal, PharmD [phone]

## 2013-01-07 NOTE — Progress Notes (Signed)
Pt discharged home with wife. Discharge instructions provided by Harvel Ricks, PA. All questions answered to pt and family's satisfaction . Pt escorted off unit in w/c with personal belonging by Riley Nearing, NT

## 2013-01-07 NOTE — Progress Notes (Signed)
Subjective/Complaints: Excited to go home.  A 12 point review of systems has been performed and if not noted above is otherwise negative.   Objective: Vital Signs: Blood pressure 121/74, pulse 68, temperature 98.4 F (36.9 C), temperature source Oral, resp. rate 18, height 6' (1.829 m), weight 125.42 kg (276 lb 8 oz), SpO2 92.00%. No results found. No results found for this basename: WBC:2,HGB:2,HCT:2,PLT:2 in the last 72 hours No results found for this basename: NA:2,K:2,CL:2,CO:2,GLUCOSE:2,BUN:2,CREATININE:2,CALCIUM:2 in the last 72 hours CBG (last 3)  No results found for this basename: GLUCAP:3 in the last 72 hours  Wt Readings from Last 3 Encounters:  01/05/13 125.42 kg (276 lb 8 oz)  12/27/12 124.286 kg (274 lb)  12/27/12 124.286 kg (274 lb)    Physical Exam:  Nursing note and vitals reviewed.  Constitutional: He is oriented to person, place, and time. He appears well-developed and well-nourished.  HENT:  Head: Normocephalic and atraumatic.  Right Ear: External ear normal.  Left Ear: External ear normal.  Eyes: Conjunctivae normal and EOM are normal. Pupils are equal, round, and reactive to light.  Neck: Normal range of motion. Neck supple. No JVD present. No tracheal deviation present. No thyromegaly present.  Cardiovascular: Normal rate, regular rhythm and normal heart sounds.  No murmur heard.  Pulmonary/Chest: Effort normal and breath sounds normal. No respiratory distress. He has no wheezes.  Abdominal: Soft. Bowel sounds are normal. He exhibits no distension. There is no tenderness.  Musculoskeletal: He exhibits decreased edema and tenderness (with ROM bilateral knees. ). Knee rom 80 degrees  Knee incisions clean and intact with no drainage. Legs are appropriately tender  Lymphadenopathy:  He has no cervical adenopathy.  Neurological: He is alert and oriented to person, place, and time. No cranial nerve deficit. Coordination normal.  Skin: Skin is warm and dry.  .  Trace/mild edema bilateral knees down to foot. Minimal warmth RLE.  Psychiatric: He has a normal mood and affect. His behavior is normal. Judgment and thought content normal   Assessment/Plan: 1. Functional deficits secondary to endstage bilateral knee OA s/p bilateral TKA's which require 3+ hours per day of interdisciplinary therapy in a comprehensive inpatient rehab setting. Physiatrist is providing close team supervision and 24 hour management of active medical problems listed below. Physiatrist and rehab team continue to assess barriers to discharge/monitor patient progress toward functional and medical goals.  Dc home. outpt therapies. Home cpm. Great progress   FIM: FIM - Bathing Bathing Steps Patient Completed: Chest;Right Arm;Left Arm;Abdomen;Front perineal area;Buttocks;Right upper leg;Left upper leg;Right lower leg (including foot);Left lower leg (including foot) Bathing: 6: Assistive device (Comment) (long handled shoe horn)  FIM - Upper Body Dressing/Undressing Upper body dressing/undressing steps patient completed: Thread/unthread right sleeve of pullover shirt/dresss;Thread/unthread left sleeve of pullover shirt/dress;Put head through opening of pull over shirt/dress;Pull shirt over trunk Upper body dressing/undressing: 7: Complete Independence: No helper FIM - Lower Body Dressing/Undressing Lower body dressing/undressing steps patient completed: Thread/unthread right pants leg;Thread/unthread left pants leg;Pull pants up/down;Don/Doff right sock;Don/Doff left sock;Don/Doff right shoe;Don/Doff left shoe Lower body dressing/undressing: 6: Assistive device (Comment) (AE)  FIM - Toileting Toileting steps completed by patient: Adjust clothing prior to toileting;Performs perineal hygiene;Adjust clothing after toileting Toileting Assistive Devices: Grab bar or rail for support Toileting: 6: Assistive device: No helper  FIM - Diplomatic Services operational officer Devices:  Art gallery manager Transfers: 6-Assistive device: No helper  FIM - Banker Devices: Manufacturing systems engineer Transfer: 6: Assistive device: no helper  FIM -  Locomotion: Wheelchair Distance: 0 Locomotion: Wheelchair: 0: Activity did not occur (gait primary means) FIM - Locomotion: Ambulation Locomotion: Ambulation Assistive Devices: Designer, industrial/product Ambulation/Gait Assistance: 6: Modified independent (Device/Increase time) Locomotion: Ambulation: 6: Travels 150 ft or more independently/takes more than reasonable amount of time  Comprehension Comprehension Mode: Auditory Comprehension: 7-Follows complex conversation/direction: With no assist  Expression Expression Mode: Verbal Expression: 7-Expresses complex ideas: With no assist  Social Interaction Social Interaction: 7-Interacts appropriately with others - No medications needed.  Problem Solving Problem Solving: 7-Solves complex problems: Recognizes & self-corrects  Memory Memory: 7-Complete Independence: No helper  Medical Problem List and Plan:  1. DVT Prophylaxis/Anticoagulation: Pharmaceutical: Coumadin and Lovenox  2. Pain Management: reports reasonable with prn meds.  3. Mood: Positive. LCSW to follow for formal evaluation.  4. Neuropsych: This patient is capable of making decisions on his/her own behalf.  5. ABLA: fe supplement  6. Hypothyroid: continue supplement  7. HTN: Monitor with bid checks. Continue Norvasc.  8. Chronic constipation: On stool softeners/ miralax daily.   9. BPH: No voiding difficulties reported. Continue alfuzosin  LOS (Days) 8 A FACE TO FACE EVALUATION WAS PERFORMED  SWARTZ,ZACHARY T 01/07/2013 7:29 AM

## 2013-01-07 NOTE — Progress Notes (Signed)
Social Work Discharge Note Discharge Note  The overall goal for the admission was met for:   Discharge location: Yes-HOME WITH WIFE, ALONE WHILE SHE WORKS  Length of Stay: Yes-8 DAYS  Discharge activity level: Yes-MOD/I LEVEL  Home/community participation: Yes  Services provided included: MD, RD, PT, OT, RN, Pharmacy and SW  Financial Services: Private Insurance: [employer]  Follow-up services arranged: Outpatient: [facility] 1/13 9;30 AM  Comments (or additional information):WAIT ON HOME CPM SEE HOW DOES-OPPT TO MONITOR  Patient/Family verbalized understanding of follow-up arrangements: Yes  Individual responsible for coordination of the follow-up plan: SELF & BRENDA-WIFE  Confirmed correct DME delivered: Lucy Chris 01/07/2013    Lucy Chris

## 2013-01-12 ENCOUNTER — Ambulatory Visit (INDEPENDENT_AMBULATORY_CARE_PROVIDER_SITE_OTHER): Payer: 59 | Admitting: General Practice

## 2013-01-12 DIAGNOSIS — Z96659 Presence of unspecified artificial knee joint: Secondary | ICD-10-CM | POA: Insufficient documentation

## 2013-01-12 DIAGNOSIS — E871 Hypo-osmolality and hyponatremia: Secondary | ICD-10-CM

## 2013-01-12 LAB — POCT INR: INR: 3.2

## 2013-01-14 NOTE — Progress Notes (Signed)
Discharge summary [phone]

## 2013-01-15 NOTE — Discharge Summary (Signed)
[name], [name]                ACCOUNT NO.:  [id number]  MEDICAL RECORD NO.:  [id number]  LOCATION:  4004                         FACILITY:  [facility]  PHYSICIAN:  Ranelle Oyster, M.D.DATE OF BIRTH:  1951/03/05  DATE OF ADMISSION:  12/30/2012 DATE OF DISCHARGE:  01/07/2013                              DISCHARGE SUMMARY   DISCHARGE DIAGNOSES: 1. End-stage osteoarthritis, bilateral knees status post total knee     replacement. 2. Acute blood loss anemia. 3. Hypertension. 4. Chronic constipation.  HISTORY OF PRESENT ILLNESS:  Mr. [name] is a 62 year old male with history of obesity, hypertension, bilateral knee pain due to DJD, and failure of conservative therapy.  The patient elected to undergo bilateral total knee replacement on December 27, 2012, by Dr. Lequita Halt. Postop, he is weightbearing as tolerated and on Lovenox and Coumadin bridge for DVT prophylaxis.  He is noted to have acute blood loss anemia with hemoglobin of 9.3 and mild hyponatremia likely dilutional. Therapies were initiated and team recommending CIR.  The patient was admitted for progressive therapies.  PAST MEDICAL HISTORY: 1. Colon polyps. 2. Obesity. 3. OA. 4. Hypothyroid. 5. Hypertension. 6. BPH.  FUNCTIONAL HISTORY:  The patient was independent prior to admission.  He works part-time at [employer].  FUNCTIONAL STATUS:  The patient was mod assist for bed mobility, +2 total assist 60% for transfers, +2 total assist 80% for ambulating 92 feet with cues for posture and rolling walker use.  He required setup assist for upper body bathing and dressing, +1 total assist for lower body care.  RECENT LABORATORY DATA:  Check of lytes revealed sodium 135, potassium 3.8, chloride 98, CO2 of 30, BUN 13, creatinine 0.81 and glucose 122. CBC revealed hemoglobin 9.2, hematocrit 26.9, white count 5.6, platelets 173.  HOSPITAL COURSE:  Mr. [name] was admitted to Rehab on December 30, 2012, for  inpatient therapies to consist of PT, OT at least 3 hours 5 days a week.  Past admission, physiatrist, rehab, RN, and therapy team have worked together to provide customized, collaborative and interdisciplinary care.  The patient was maintained on Coumadin for his DVT prophylaxis.  Pain control was initiated with use of p.r.n. meds. He was started on MiraLAX in addition to stool softeners due to issues with severe constipation.  MiraLAX was titrated and additional laxatives were used on p.r.n. basis.  The patient's knee incision was monitored along.  These were healing well without any signs or symptoms of infection.  During the patient's stay in rehab, team conference was held to discuss progress as well as set goals.  The patient made great progress during his stay.  He was modified independent for transfers and mobility.  He requires supervision to navigate stairs.  Family education was done with wife who can provide supervision needed past discharge. OT has worked with the patient on self-care tasks.  The patient was modified independent level for all aspects of daily living.  Further followup outpatient therapy was set up at [facility] to begin on January 10, 2013.  He was set up with [facility] for monitoring and followup of his Coumadin.  On January 07, 2013, the  patient was discharged to home in improved condition.  DISCHARGE MEDICATIONS: 1. Niferex 150 mg p.o. b.i.d. 2. Uroxatral 10 mg p.o. per day. 3. Norvasc 5 mg p.o. per day. 4. Tylenol 650 mg p.o. q.6 h. p.r.n. pain. 5. Levothyroxine 125 mcg p.o. per day. 6. Robaxin 500 mg p.o. q.6 h. p.r.n. pain. 7. OxyIR 5-10 mg p.o. every 3 hours p.r.n. pain. 8. Coumadin 5 mg q.p.m. for 4 weeks, then resume 81 mg aspirin daily.  DIET:  Regular.  ACTIVITY LEVEL:  As tolerated with walker.  WOUND CARE:  Keep area clean and dry.  SPECIAL INSTRUCTIONS:  [facility] outpatient  physical therapy to start on January 11, 2012, at 9:30 a.m.  [facility] appointment on January 12, 2012, at 10:45 a.m.  Follow up with Dr. Lequita Halt for postop check.  Follow up with Dr. Riley Kill as needed. Follow up with Dr. Felicity [name] for posthospital checkup.     Delle Reining, P.A.   ______________________________ Ranelle Oyster, M.D.    PL/MEDQ  D:  01/14/2013  T:  01/15/2013  Job:  [id number]  cc:   [name], M.D. [name] A. Felicity Coyer, MD

## 2013-01-19 ENCOUNTER — Ambulatory Visit (INDEPENDENT_AMBULATORY_CARE_PROVIDER_SITE_OTHER): Payer: 59 | Admitting: General Practice

## 2013-01-19 DIAGNOSIS — E871 Hypo-osmolality and hyponatremia: Secondary | ICD-10-CM

## 2013-01-19 DIAGNOSIS — Z96659 Presence of unspecified artificial knee joint: Secondary | ICD-10-CM

## 2013-01-19 LAB — POCT INR: INR: 3.2

## 2013-02-01 ENCOUNTER — Other Ambulatory Visit: Payer: Self-pay | Admitting: Internal Medicine

## 2013-03-02 ENCOUNTER — Ambulatory Visit (INDEPENDENT_AMBULATORY_CARE_PROVIDER_SITE_OTHER): Payer: 59 | Admitting: Internal Medicine

## 2013-03-02 ENCOUNTER — Encounter: Payer: Self-pay | Admitting: Internal Medicine

## 2013-03-02 ENCOUNTER — Ambulatory Visit (INDEPENDENT_AMBULATORY_CARE_PROVIDER_SITE_OTHER): Payer: 59 | Admitting: General Practice

## 2013-03-02 ENCOUNTER — Other Ambulatory Visit (INDEPENDENT_AMBULATORY_CARE_PROVIDER_SITE_OTHER): Payer: 59

## 2013-03-02 VITALS — BP 132/82 | HR 81 | Temp 97.9°F | Wt 268.0 lb

## 2013-03-02 DIAGNOSIS — D62 Acute posthemorrhagic anemia: Secondary | ICD-10-CM

## 2013-03-02 DIAGNOSIS — E039 Hypothyroidism, unspecified: Secondary | ICD-10-CM

## 2013-03-02 DIAGNOSIS — E785 Hyperlipidemia, unspecified: Secondary | ICD-10-CM

## 2013-03-02 DIAGNOSIS — I10 Essential (primary) hypertension: Secondary | ICD-10-CM

## 2013-03-02 LAB — CBC WITH DIFFERENTIAL/PLATELET
Basophils Absolute: 0.1 10*3/uL (ref 0.0–0.1)
Basophils Relative: 1.2 % (ref 0.0–3.0)
Eosinophils Absolute: 0.1 10*3/uL (ref 0.0–0.7)
Eosinophils Relative: 2.8 % (ref 0.0–5.0)
HCT: 41.8 % (ref 39.0–52.0)
Hemoglobin: 14.1 g/dL (ref 13.0–17.0)
Lymphocytes Relative: 26.7 % (ref 12.0–46.0)
Lymphs Abs: 1.2 10*3/uL (ref 0.7–4.0)
MCHC: 33.8 g/dL (ref 30.0–36.0)
MCV: 83.8 fl (ref 78.0–100.0)
Monocytes Absolute: 0.5 10*3/uL (ref 0.1–1.0)
Monocytes Relative: 11.4 % (ref 3.0–12.0)
Neutro Abs: 2.6 10*3/uL (ref 1.4–7.7)
Neutrophils Relative %: 57.9 % (ref 43.0–77.0)
Platelets: 216 10*3/uL (ref 150.0–400.0)
RBC: 4.99 Mil/uL (ref 4.22–5.81)
RDW: 14.8 % — ABNORMAL HIGH (ref 11.5–14.6)
WBC: 4.4 10*3/uL — ABNORMAL LOW (ref 4.5–10.5)

## 2013-03-02 LAB — LIPID PANEL
Cholesterol: 172 mg/dL (ref 0–200)
HDL: 43.2 mg/dL (ref >39.00)
LDL Cholesterol: 98 mg/dL (ref 0–99)
Total CHOL/HDL Ratio: 4
Triglycerides: 155 mg/dL — ABNORMAL HIGH (ref 0.0–149.0)
VLDL: 31 mg/dL (ref 0.0–40.0)

## 2013-03-02 LAB — TSH: TSH: 3.34 u[IU]/mL (ref 0.35–5.50)

## 2013-03-02 NOTE — Assessment & Plan Note (Signed)
The current medical regimen is effective;  continue present plan and medications. Lab Results  Component Value Date   TSH 4.41 12/02/2012

## 2013-03-02 NOTE — Assessment & Plan Note (Signed)
BP Readings from Last 3 Encounters:  03/02/13 132/82  01/07/13 121/74  12/30/12 116/76   Increased trend - started amlodipine 5mg  qd 11/2012 Educated on weight, diet and exercise to control same The current medical regimen is effective;  continue present plan and medications.

## 2013-03-02 NOTE — Assessment & Plan Note (Signed)
Increase trend in LDL and total at last check - compliance with omega3 without change Advised attention to weight with diet/exercise Check annually and continue to work on weight loss/exercise as ongoing

## 2013-03-02 NOTE — Progress Notes (Signed)
  Subjective:    Patient ID: [name], male    DOB: 05-17-1951, 62 y.o.   MRN: [id number]  HPI  Here for follow up - reviewed chronic medical issues:  Hypothyroid - the patient reports compliance with medication(s) as prescribed. Denies adverse side effects. No skin or bowel changes.  HTN - "borderline" - never on meds for same - worse with weight gain  Dyslipidemia - takes omega 3 fish oil, never on statin rx -  osteoarthritis - s/p B TKR 12/27/12, on anticoag x 4 weeks post op, improved pain, no swelling - 2 otc aleve prn  Past Medical History  Diagnosis Date  . COLONIC POLYPS, HX OF 2007  . Arthritis     Knees  . OBESITY   . HYPOTHYROIDISM   . HYPERTENSION   . BPH (benign prostatic hypertrophy)     Review of Systems Constitutional: Negative for fever or weight change.  Respiratory: Negative for cough and shortness of breath.   Cardiovascular: Negative for chest pain or palpitations.       Objective:   Physical Exam  BP 132/82  Pulse 81  Temp(Src) 97.9 F (36.6 C) (Oral)  Wt 268 lb (121.564 kg)  BMI 36.34 kg/m2  SpO2 96% Wt Readings from Last 3 Encounters:  03/02/13 268 lb (121.564 kg)  01/05/13 276 lb 8 oz (125.42 kg)  12/27/12 274 lb (124.286 kg)   Constitutional:  He is overweight, but appears well-developed and well-nourished. No distress.  Neck: Thick. Normal range of motion. Neck supple. No JVD present. No thyromegaly present.  Cardiovascular: Normal rate, regular rhythm and normal heart sounds.  No murmur heard. no BLE edema Pulmonary/Chest: Effort normal and breath sounds normal. No respiratory distress. no wheezes.  Abdominal: Soft. Bowel sounds are normal. Patient exhibits no distension. There is no tenderness.  Musculoskeletal: B knees - well healed scar from TKR; FROM and ligamentous function intact  Neurological: he is alert and oriented to person, place, and time. No cranial nerve deficit. Coordination normal.  Skin: Skin is warm and dry.   No erythema or ulceration.  Psychiatric: he has a normal mood and affect. behavior is normal. Judgment and thought content normal.   Lab Results  Component Value Date   WBC 5.6 12/31/2012   HGB 9.2* 12/31/2012   HCT 26.9* 12/31/2012   PLT 173 12/31/2012   CHOL 222* 06/09/2011   TRIG 122.0 06/09/2011   HDL 48.90 06/09/2011   LDLDIRECT 153.1 06/09/2011   ALT 12 12/31/2012   AST 17 12/31/2012   NA 135 12/31/2012   K 3.8 12/31/2012   CL 98 12/31/2012   CREATININE 0.81 12/31/2012   BUN 13 12/31/2012   CO2 30 12/31/2012   TSH 4.41 12/02/2012   PSA 5.72* 12/15/2011   INR 3.2 01/19/2013        Assessment & Plan:   See problem list. Medications and labs reviewed today.  ABL anemia post op B TKR 12/27/13 - recheck now and consider iron supp if needed

## 2013-03-02 NOTE — Patient Instructions (Signed)
It was good to see you today. We have reviewed your prior records including labs and tests today Test(s) ordered today. Your results will be released to MyChart (or called to you) after review, usually within 72hours after test completion. If any changes need to be made, you will be notified at that same time. Medications reviewed and updated, no changes at this time. Please schedule followup in 6-12 months for medical physical and labs, weight recheck and blood pressure recheck, call sooner if problems.

## 2013-08-12 ENCOUNTER — Ambulatory Visit (INDEPENDENT_AMBULATORY_CARE_PROVIDER_SITE_OTHER): Payer: 59 | Admitting: *Deleted

## 2013-08-12 DIAGNOSIS — Z2911 Encounter for prophylactic immunotherapy for respiratory syncytial virus (RSV): Secondary | ICD-10-CM

## 2013-08-12 DIAGNOSIS — Z23 Encounter for immunization: Secondary | ICD-10-CM

## 2013-10-31 ENCOUNTER — Other Ambulatory Visit: Payer: Self-pay | Admitting: Internal Medicine

## 2013-11-28 ENCOUNTER — Other Ambulatory Visit: Payer: Self-pay | Admitting: Internal Medicine

## 2013-12-30 ENCOUNTER — Emergency Department (HOSPITAL_COMMUNITY)
Admission: EM | Admit: 2013-12-30 | Discharge: 2013-12-30 | Disposition: A | Discharge status: Home / Self Care | Payer: 59 | Source: Home / Self Care

## 2013-12-30 ENCOUNTER — Encounter (HOSPITAL_COMMUNITY): Payer: Self-pay | Admitting: Emergency Medicine

## 2013-12-30 DIAGNOSIS — R059 Cough, unspecified: Secondary | ICD-10-CM

## 2013-12-30 DIAGNOSIS — R05 Cough: Secondary | ICD-10-CM

## 2013-12-30 DIAGNOSIS — R0982 Postnasal drip: Secondary | ICD-10-CM

## 2013-12-30 DIAGNOSIS — J069 Acute upper respiratory infection, unspecified: Secondary | ICD-10-CM

## 2013-12-30 MED ORDER — ALBUTEROL SULFATE HFA 108 (90 BASE) MCG/ACT IN AERS: 2.0000 | INHALATION_SPRAY | RESPIRATORY_TRACT | Status: DC | PRN

## 2013-12-30 NOTE — Discharge Instructions (Signed)
Cough, Adult  A cough is a reflex that helps clear your throat and airways. It can help heal the body or may be a reaction to an irritated airway. A cough may only last 2 or 3 weeks (acute) or may last more than 8 weeks (chronic).  CAUSES Acute cough:  Viral or bacterial infections. Chronic cough:  Infections.  Allergies.  Asthma.  Post-nasal drip.  Smoking.  Heartburn or acid reflux.  Some medicines.  Chronic lung problems (COPD).  Cancer. SYMPTOMS   Cough.  Fever.  Chest pain.  Increased breathing rate.  High-pitched whistling sound when breathing (wheezing).  Colored mucus that you cough up (sputum). TREATMENT   A bacterial cough may be treated with antibiotic medicine.  A viral cough must run its course and will not respond to antibiotics.  Your caregiver may recommend other treatments if you have a chronic cough. HOME CARE INSTRUCTIONS   Only take over-the-counter or prescription medicines for pain, discomfort, or fever as directed by your caregiver. Use cough suppressants only as directed by your caregiver.  Use a cold steam vaporizer or humidifier in your bedroom or home to help loosen secretions.  Sleep in a semi-upright position if your cough is worse at night.  Rest as needed.  Stop smoking if you smoke. SEEK IMMEDIATE MEDICAL CARE IF:   You have pus in your sputum.  Your cough starts to worsen.  You cannot control your cough with suppressants and are losing sleep.  You begin coughing up blood.  You have difficulty breathing.  You develop pain which is getting worse or is uncontrolled with medicine.  You have a fever. MAKE SURE YOU:   Understand these instructions.  Will watch your condition.  Will get help right away if you are not doing well or get worse. Document Released: 06/13/2011 Document Revised: 03/08/2012 Document Reviewed: 06/13/2011 [facility] Patient Information 2014 [place].  Upper Respiratory Infection,  Adult Alka-Seltzer cold plus nighttime medicine Robitussin-DM An upper respiratory infection (URI) is also sometimes known as the common cold. The upper respiratory tract includes the nose, sinuses, throat, trachea, and bronchi. Bronchi are the airways leading to the lungs. Most people improve within 1 week, but symptoms can last up to 2 weeks. A residual cough may last even longer.  CAUSES Many different viruses can infect the tissues lining the upper respiratory tract. The tissues become irritated and inflamed and often become very moist. Mucus production is also common. A cold is contagious. You can easily spread the virus to others by oral contact. This includes kissing, sharing a glass, coughing, or sneezing. Touching your mouth or nose and then touching a surface, which is then touched by another person, can also spread the virus. SYMPTOMS  Symptoms typically develop 1 to 3 days after you come in contact with a cold virus. Symptoms vary from person to person. They may include:  Runny nose.  Sneezing.  Nasal congestion.  Sinus irritation.  Sore throat.  Loss of voice (laryngitis).  Cough.  Fatigue.  Muscle aches.  Loss of appetite.  Headache.  Low-grade fever. DIAGNOSIS  You might diagnose your own cold based on familiar symptoms, since most people get a cold 2 to 3 times a year. Your caregiver can confirm this based on your exam. Most importantly, your caregiver can check that your symptoms are not due to another disease such as strep throat, sinusitis, pneumonia, asthma, or epiglottitis. Blood tests, throat tests, and X-rays are not necessary to diagnose a common cold,  but they may sometimes be helpful in excluding other more serious diseases. Your caregiver will decide if any further tests are required. RISKS AND COMPLICATIONS  You may be at risk for a more severe case of the common cold if you smoke cigarettes, have chronic heart disease (such as heart failure) or lung  disease (such as asthma), or if you have a weakened immune system. The very young and very old are also at risk for more serious infections. Bacterial sinusitis, middle ear infections, and bacterial pneumonia can complicate the common cold. The common cold can worsen asthma and chronic obstructive pulmonary disease (COPD). Sometimes, these complications can require emergency medical care and may be life-threatening. PREVENTION  The best way to protect against getting a cold is to practice good hygiene. Avoid oral or hand contact with people with cold symptoms. Wash your hands often if contact occurs. There is no clear evidence that vitamin C, vitamin E, echinacea, or exercise reduces the chance of developing a cold. However, it is always recommended to get plenty of rest and practice good nutrition. TREATMENT  Treatment is directed at relieving symptoms. There is no cure. Antibiotics are not effective, because the infection is caused by a virus, not by bacteria. Treatment may include:  Increased fluid intake. Sports drinks offer valuable electrolytes, sugars, and fluids.  Breathing heated mist or steam (vaporizer or shower).  Eating chicken soup or other clear broths, and maintaining good nutrition.  Getting plenty of rest.  Using gargles or lozenges for comfort.  Controlling fevers with ibuprofen or acetaminophen as directed by your caregiver.  Increasing usage of your inhaler if you have asthma. Zinc gel and zinc lozenges, taken in the first 24 hours of the common cold, can shorten the duration and lessen the severity of symptoms. Pain medicines may help with fever, muscle aches, and throat pain. A variety of non-prescription medicines are available to treat congestion and runny nose. Your caregiver can make recommendations and may suggest nasal or lung inhalers for other symptoms.  HOME CARE INSTRUCTIONS   Only take over-the-counter or prescription medicines for pain, discomfort, or fever as  directed by your caregiver.  Use a warm mist humidifier or inhale steam from a shower to increase air moisture. This may keep secretions moist and make it easier to breathe.  Drink enough water and fluids to keep your urine clear or pale yellow.  Rest as needed.  Return to work when your temperature has returned to normal or as your caregiver advises. You may need to stay home longer to avoid infecting others. You can also use a face mask and careful hand washing to prevent spread of the virus. SEEK MEDICAL CARE IF:   After the first few days, you feel you are getting worse rather than better.  You need your caregiver's advice about medicines to control symptoms.  You develop chills, worsening shortness of breath, or brown or red sputum. These may be signs of pneumonia.  You develop yellow or brown nasal discharge or pain in the face, especially when you bend forward. These may be signs of sinusitis.  You develop a fever, swollen neck glands, pain with swallowing, or white areas in the back of your throat. These may be signs of strep throat. SEEK IMMEDIATE MEDICAL CARE IF:   You have a fever.  You develop severe or persistent headache, ear pain, sinus pain, or chest pain.  You develop wheezing, a prolonged cough, cough up blood, or have a change in your  usual mucus (if you have chronic lung disease).  You develop sore muscles or a stiff neck. Document Released: 06/10/2001 Document Revised: 03/08/2012 Document Reviewed: 04/18/2011 [facility] Patient Information 2014 [place], [place].

## 2013-12-30 NOTE — ED Provider Notes (Signed)
CSN: [id number]     Arrival date & time 12/30/13  9147 History   First MD Initiated Contact with Patient 12/30/13 0845     Chief Complaint  Patient presents with  . Cough   (Consider location/radiation/quality/duration/timing/severity/associated sxs/prior Treatment) HPI Comments: 63 year old male with complaints of cough is worse at night. This started approximately 4-5 days ago. He denies fever, shortness of breath earache or nasal congestion. He has no history of asthma, COPD nor has he ever smoked.   Past Medical History  Diagnosis Date  . COLONIC POLYPS, HX OF 2007  . Arthritis     Knees  . OBESITY   . HYPOTHYROIDISM   . HYPERTENSION   . BPH (benign prostatic hypertrophy)    Past Surgical History  Procedure Laterality Date  . Tonsillectomy    . Laminectomy  1991  . Arthroscopic knee surgery      (R) 2003 & (L) 2010  . Total hip arthroplasty  01/2011    R - alusio  . Tonsillectomy    . Total knee arthroplasty  12/27/2012    Procedure: TOTAL KNEE BILATERAL;  Surgeon: Gearlean Alf, MD;  Location: [facility];  Service: Orthopedics;  Laterality: Bilateral;   Family History  Problem Relation Age of Onset  . Heart disease Father   . Arthritis Other     Parent & grandparents  . Diabetes Other     parents  . Hypertension Other     parents   History  Substance Use Topics  . Smoking status: Never Smoker   . Smokeless tobacco: Never Used  . Alcohol Use: Yes     Comment: rarely, social    Review of Systems  Constitutional: Negative for fever, diaphoresis, activity change and fatigue.  HENT: Positive for postnasal drip and trouble swallowing. Negative for ear pain, facial swelling, rhinorrhea and sore throat.   Eyes: Negative for pain, discharge and redness.  Respiratory: Positive for cough. Negative for chest tightness, shortness of breath and wheezing.   Cardiovascular: Negative.   Gastrointestinal: Negative.   Musculoskeletal: Negative.  Negative for neck pain and neck  stiffness.  Neurological: Negative.     Allergies  Review of patient's allergies indicates no known allergies.  Home Medications   Current Outpatient Rx  Name  Route  Sig  Dispense  Refill  . acetaminophen (TYLENOL) 325 MG tablet   Oral   Take 2 tablets (650 mg total) by mouth every 6 (six) hours as needed (or Fever >/= 101).   80 tablet   0   . albuterol (PROVENTIL HFA;VENTOLIN HFA) 108 (90 BASE) MCG/ACT inhaler   Inhalation   Inhale 2 puffs into the lungs every 4 (four) hours as needed for wheezing or shortness of breath.   1 Inhaler   0   . alfuzosin (UROXATRAL) 10 MG 24 hr tablet   Oral   Take 10 mg by mouth every evening.          Marland Kitchen amLODipine (NORVASC) 5 MG tablet   Oral   Take 5 mg by mouth daily before breakfast.         . amLODipine (NORVASC) 5 MG tablet      TAKE 1 TABLET BY MOUTH ONCE DAILY   30 tablet   11   . levothyroxine (SYNTHROID, LEVOTHROID) 125 MCG tablet      TAKE 1 TABLET BY MOUTH ONCE DAILY   90 tablet   2    BP 130/81  Pulse 90  Temp(Src) 98.5 F (  36.9 C) (Oral)  Resp 18  SpO2 96% Physical Exam  Nursing note and vitals reviewed. Constitutional: He is oriented to person, place, and time. He appears well-developed and well-nourished. No distress.  HENT:  Bilateral TMs are normal Oropharynx with minor erythema and clear PND. No exudates  Eyes: Conjunctivae and EOM are normal.  Neck: Normal range of motion. Neck supple.  Cardiovascular: Normal rate, regular rhythm and normal heart sounds.   Pulmonary/Chest: Effort normal and breath sounds normal. No respiratory distress.  With forced expiration there is faint end expiratory wheeze primarily to the left airways.  Musculoskeletal: Normal range of motion. He exhibits no edema.  Lymphadenopathy:    He has no cervical adenopathy.  Neurological: He is alert and oriented to person, place, and time.  Skin: Skin is warm and dry. No rash noted.  Psychiatric: He has a normal mood and  affect.    ED Course  Procedures (including critical care time) Labs Review Labs Reviewed - No data to display Imaging Review No results found.    MDM   1. URI (upper respiratory infection)   2. PND (post-nasal drip)   3. Cough    OTC meds Albuterol HFA as dir Return for problems, fever, dyspnea.      Janne Napoleon, NP 12/30/13 [phone]

## 2013-12-30 NOTE — ED Notes (Signed)
Cough, onset 4 days ago.  Cough worsens at night, hot and cold episodes, no fever, no ear pain, throat pain only with cough

## 2013-12-30 NOTE — ED Provider Notes (Signed)
Medical screening examination/treatment/procedure(s) were performed by resident physician or non-physician practitioner and as supervising physician I was immediately available for consultation/collaboration.   Pauline Good MD.   Billy Fischer, MD 12/30/13 [phone]

## 2014-03-06 ENCOUNTER — Encounter: Payer: 59 | Admitting: Internal Medicine

## 2014-03-13 ENCOUNTER — Encounter: Payer: 59 | Admitting: Internal Medicine

## 2014-03-30 ENCOUNTER — Ambulatory Visit (INDEPENDENT_AMBULATORY_CARE_PROVIDER_SITE_OTHER): Payer: 59 | Admitting: Internal Medicine

## 2014-03-30 ENCOUNTER — Encounter: Payer: Self-pay | Admitting: Internal Medicine

## 2014-03-30 ENCOUNTER — Other Ambulatory Visit (INDEPENDENT_AMBULATORY_CARE_PROVIDER_SITE_OTHER): Payer: 59

## 2014-03-30 VITALS — BP 130/82 | HR 64 | Temp 98.1°F | Wt 278.2 lb

## 2014-03-30 DIAGNOSIS — E669 Obesity, unspecified: Secondary | ICD-10-CM

## 2014-03-30 DIAGNOSIS — Z0000 Encounter for general adult medical examination without abnormal findings: Secondary | ICD-10-CM

## 2014-03-30 DIAGNOSIS — E039 Hypothyroidism, unspecified: Secondary | ICD-10-CM

## 2014-03-30 DIAGNOSIS — I10 Essential (primary) hypertension: Secondary | ICD-10-CM

## 2014-03-30 DIAGNOSIS — Z1211 Encounter for screening for malignant neoplasm of colon: Secondary | ICD-10-CM

## 2014-03-30 LAB — CBC WITH DIFFERENTIAL/PLATELET
Basophils Absolute: 0 10*3/uL (ref 0.0–0.1)
Basophils Relative: 0.8 % (ref 0.0–3.0)
Eosinophils Absolute: 0.2 10*3/uL (ref 0.0–0.7)
Eosinophils Relative: 3.2 % (ref 0.0–5.0)
HCT: 43.7 % (ref 39.0–52.0)
Hemoglobin: 14.9 g/dL (ref 13.0–17.0)
Lymphocytes Relative: 26.8 % (ref 12.0–46.0)
Lymphs Abs: 1.5 10*3/uL (ref 0.7–4.0)
MCHC: 34 g/dL (ref 30.0–36.0)
MCV: 87.3 fl (ref 78.0–100.0)
Monocytes Absolute: 0.7 10*3/uL (ref 0.1–1.0)
Monocytes Relative: 11.5 % (ref 3.0–12.0)
Neutro Abs: 3.3 10*3/uL (ref 1.4–7.7)
Neutrophils Relative %: 57.7 % (ref 43.0–77.0)
Platelets: 230 10*3/uL (ref 150.0–400.0)
RBC: 5.01 Mil/uL (ref 4.22–5.81)
RDW: 13.1 % (ref 11.5–14.6)
WBC: 5.7 10*3/uL (ref 4.5–10.5)

## 2014-03-30 LAB — URINALYSIS, ROUTINE W REFLEX MICROSCOPIC
Bilirubin Urine: NEGATIVE
Hgb urine dipstick: NEGATIVE
Ketones, ur: NEGATIVE
Leukocytes, UA: NEGATIVE
Nitrite: NEGATIVE
Specific Gravity, Urine: >=1.03 — AB (ref 1.000–1.030)
Total Protein, Urine: NEGATIVE
Urine Glucose: NEGATIVE
Urobilinogen, UA: 0.2 (ref 0.0–1.0)
pH: 5.5 (ref 5.0–8.0)

## 2014-03-30 LAB — BASIC METABOLIC PANEL
BUN: 16 mg/dL (ref 6–23)
CO2: 24 mEq/L (ref 19–32)
Calcium: 9.3 mg/dL (ref 8.4–10.5)
Chloride: 104 mEq/L (ref 96–112)
Creatinine, Ser: 0.8 mg/dL (ref 0.4–1.5)
GFR: 107.64 mL/min (ref >60.00)
Glucose, Bld: 97 mg/dL (ref 70–99)
Potassium: 4 mEq/L (ref 3.5–5.1)
Sodium: 137 mEq/L (ref 135–145)

## 2014-03-30 LAB — LIPID PANEL
Cholesterol: 161 mg/dL (ref 0–200)
HDL: 38.9 mg/dL — ABNORMAL LOW (ref >39.00)
LDL Cholesterol: 105 mg/dL — ABNORMAL HIGH (ref 0–99)
Total CHOL/HDL Ratio: 4
Triglycerides: 85 mg/dL (ref 0.0–149.0)
VLDL: 17 mg/dL (ref 0.0–40.0)

## 2014-03-30 LAB — HEPATIC FUNCTION PANEL
ALT: 22 U/L (ref 0–53)
AST: 17 U/L (ref 0–37)
Albumin: 4.4 g/dL (ref 3.5–5.2)
Alkaline Phosphatase: 84 U/L (ref 39–117)
Bilirubin, Direct: 0.1 mg/dL (ref 0.0–0.3)
Total Bilirubin: 0.8 mg/dL (ref 0.3–1.2)
Total Protein: 7.5 g/dL (ref 6.0–8.3)

## 2014-03-30 LAB — TSH: TSH: 2.61 u[IU]/mL (ref 0.35–5.50)

## 2014-03-30 NOTE — Assessment & Plan Note (Signed)
The current medical regimen is effective;  continue present plan and medications. Lab Results  Component Value Date   TSH 3.34 03/02/2013

## 2014-03-30 NOTE — Assessment & Plan Note (Signed)
Weight trend reviewed The patient is asked to make an attempt to improve diet and exercise patterns to aid in medical management of this problem.

## 2014-03-30 NOTE — Assessment & Plan Note (Signed)
BP Readings from Last 3 Encounters:  03/30/14 130/82  12/30/13 130/81  03/02/13 132/82   started amlodipine 5mg  qd 11/2012 Educated on weight, diet and exercise to control same The current medical regimen is generally effective;  continue present plan and medications.

## 2014-03-30 NOTE — Patient Instructions (Addendum)
It was good to see you today.  We have reviewed your prior records including labs and tests today  Health Maintenance reviewed - all recommended immunizations and age-appropriate screenings are up-to-date.  Test(s) ordered today. Your results will be released to [place] (or called to you) after review, usually within 72hours after test completion. If any changes need to be made, you will be notified at that same time.  Medications reviewed and updated, no changes recommended at this time.  we'll make referral to gastroenterology for colonoscopy screening . Our office will contact you regarding appointment(s) once made.  Please schedule followup in 12 months for annual exam and labs, call sooner if problems.  Health Maintenance, Males A healthy lifestyle and preventative care can promote health and wellness.  Maintain regular health, dental, and eye exams.  Eat a healthy diet. Foods like vegetables, fruits, whole grains, low-fat dairy products, and lean protein foods contain the nutrients you need and are low in calories. Decrease your intake of foods high in solid fats, added sugars, and salt. Get information about a proper diet from your health care provider, if necessary.  Regular physical exercise is one of the most important things you can do for your health. Most adults should get at least 150 minutes of moderate-intensity exercise (any activity that increases your heart rate and causes you to sweat) each week. In addition, most adults need muscle-strengthening exercises on 2 or more days a week.   Maintain a healthy weight. The body mass index (BMI) is a screening tool to identify possible weight problems. It provides an estimate of body fat based on height and weight. Your health care provider can find your BMI and can help you achieve or maintain a healthy weight. For males 20 years and older:  A BMI below 18.5 is considered underweight.  A BMI of 18.5 to 24.9 is normal.  A BMI  of 25 to 29.9 is considered overweight.  A BMI of 30 and above is considered obese.  Maintain normal blood lipids and cholesterol by exercising and minimizing your intake of saturated fat. Eat a balanced diet with plenty of fruits and vegetables. Blood tests for lipids and cholesterol should begin at age 6 and be repeated every 5 years. If your lipid or cholesterol levels are high, you are over 50, or you are at high risk for heart disease, you may need your cholesterol levels checked more frequently.Ongoing high lipid and cholesterol levels should be treated with medicines, if diet and exercise are not working.  If you smoke, find out from your health care provider how to quit. If you do not use tobacco, do not start.  Lung cancer screening is recommended for adults aged 44 80 years who are at high risk for developing lung cancer because of a history of smoking. A yearly low-dose CT scan of the lungs is recommended for people who have at least a 30-pack-year history of smoking and are a current smoker or have quit within the past 15 years. A pack year of smoking is smoking an average of 1 pack of cigarettes a day for 1 year (for example, a 30-pack-year history of smoking could mean smoking 1 pack a day for 30 years or 2 packs a day for 15 years). Yearly screening should continue until the smoker has stopped smoking for at least 15 years. Yearly screening should be stopped for people who develop a health problem that would prevent them from having lung cancer treatment.  If you  choose to drink alcohol, do not have more than 2 drinks per day. One drink is considered to be 12 oz (360 mL) of beer, 5 oz (150 mL) of wine, or 1.5 oz (45 mL) of liquor.  Avoid use of street drugs. Do not share needles with anyone. Ask for help if you need support or instructions about stopping the use of drugs.  High blood pressure causes heart disease and increases the risk of stroke. Blood pressure should be checked at  least every 1 2 years. Ongoing high blood pressure should be treated with medicines if weight loss and exercise are not effective.  If you are 53 61 years old, ask your health care provider if you should take aspirin to prevent heart disease.  Diabetes screening involves taking a blood sample to check your fasting blood sugar level. This should be done once every 3 years after age 35, if you are at a normal weight and without risk factors for diabetes. Testing should be considered at a younger age or be carried out more frequently if you are overweight and have at least 1 risk factor for diabetes.  Colorectal cancer can be detected and often prevented. Most routine colorectal cancer screening begins at the age of 27 and continues through age 65. However, your health care provider may recommend screening at an earlier age if you have risk factors for colon cancer. On a yearly basis, your health care provider may provide home test kits to check for hidden blood in the stool. A small camera at the end of a tube may be used to directly examine the colon (sigmoidoscopy or colonoscopy) to detect the earliest forms of colorectal cancer. Talk to your health care provider about this at age 62, when routine screening begins. A direct exam of the colon should be repeated every 5 10 years through age 49, unless early forms of pre-cancerous polyps or small growths are found.  People who are at an increased risk for hepatitis B should be screened for this virus. You are considered at high risk for hepatitis B if:  You were born in a country where hepatitis B occurs often. Talk with your health care provider about which countries are considered high-risk.  Your parents were born in a high-risk country and you have not received a shot to protect against hepatitis B (hepatitis B vaccine).  You have HIV or AIDS.  You use needles to inject street drugs.  You live with, or have sex with, someone who has hepatitis  B.  You are a man who has sex with other men (MSM).  You get hemodialysis treatment.  You take certain medicines for conditions like cancer, organ transplantation, and autoimmune conditions.  Hepatitis C blood testing is recommended for all people born from [birth year] through 1963 and any individual with known risk factors for hepatitis C.  Healthy men should no longer receive prostate-specific antigen (PSA) blood tests as part of routine cancer screening. Talk to your health care provider about prostate cancer screening.  Testicular cancer screening is not recommended for adolescents or adult males who have no symptoms. Screening includes self-exam, a health care provider exam, and other screening tests. Consult with your health care provider about any symptoms you have or any concerns you have about testicular cancer.  Practice safe sex. Use condoms and avoid high-risk sexual practices to reduce the spread of sexually transmitted infections (STIs).  Use sunscreen. Apply sunscreen liberally and repeatedly throughout the day. You should seek shade  when your shadow is shorter than you. Protect yourself by wearing long sleeves, pants, a wide-brimmed hat, and sunglasses year round, whenever you are outdoors.  Tell your health care provider of new moles or changes in moles, especially if there is a change in shape or color. Also tell your provider if a mole is larger than the size of a pencil eraser.  A one-time screening for abdominal aortic aneurysm (AAA) and surgical repair of large AAAs by ultrasound is recommended for men aged 34 75 years who are current or former smokers.  Stay current with your vaccines (immunizations). Document Released: 06/12/2008 Document Revised: 10/05/2013 Document Reviewed: 05/12/2011 [facility] Patient Information 2014 [place], [place].

## 2014-03-30 NOTE — Progress Notes (Signed)
Pre visit review using our clinic review tool, if applicable. No additional management support is needed unless otherwise documented below in the visit note. 

## 2014-03-30 NOTE — Progress Notes (Signed)
Subjective:    Patient ID: [name], male    DOB: 05-15-1951, 63 y.o.   MRN: [id number]  HPI  patient is here today for annual physical. Patient feels well and has no complaints.  Also reviewed chronic medical issues and interval medical events  Past Medical History  Diagnosis Date  . COLONIC POLYPS, HX OF 2007  . Arthritis     Knees  . OBESITY   . HYPOTHYROIDISM   . HYPERTENSION   . BPH (benign prostatic hypertrophy)    Family History  Problem Relation Age of Onset  . Heart disease Father     CABG age 38  . Arthritis Other     Parent & grandparents  . Diabetes Mother   . Hypertension Father    History  Substance Use Topics  . Smoking status: Never Smoker   . Smokeless tobacco: Never Used  . Alcohol Use: Yes     Comment: rarely, social    Review of Systems  Constitutional: Negative for fever, activity change, appetite change, fatigue and unexpected weight change.  Respiratory: Negative for cough, chest tightness, shortness of breath and wheezing.   Cardiovascular: Negative for chest pain, palpitations and leg swelling.  Musculoskeletal: Positive for arthralgias (r hip x 6 weeks - working with ortho on same). Negative for back pain, joint swelling and myalgias.  Neurological: Negative for dizziness, weakness and headaches.  Psychiatric/Behavioral: Negative for dysphoric mood. The patient is not nervous/anxious.   All other systems reviewed and are negative.       Objective:   Physical Exam  BP 130/82  Pulse 64  Temp(Src) 98.1 F (36.7 C) (Oral)  Wt 278 lb 3.2 oz (126.191 kg)  SpO2 96% Wt Readings from Last 3 Encounters:  03/30/14 278 lb 3.2 oz (126.191 kg)  03/02/13 268 lb (121.564 kg)  01/05/13 276 lb 8 oz (125.42 kg)   Constitutional: he is obese, but appears well-developed and well-nourished. No distress.  HENT: Head: Normocephalic and atraumatic. Ears: B TMs ok, no erythema or effusion; Nose: Nose normal. Mouth/Throat: Oropharynx is clear and  moist. No oropharyngeal exudate.  Eyes: Conjunctivae and EOM are normal. Pupils are equal, round, and reactive to light. No scleral icterus.  Neck: Thick, Normal range of motion. Neck supple. No JVD present. No thyromegaly present.  Cardiovascular: Normal rate, regular rhythm and normal heart sounds.  No murmur heard. No BLE edema. Pulmonary/Chest: Effort normal and breath sounds normal. No respiratory distress. he has no wheezes.  Abdominal: Soft. Bowel sounds are normal. he exhibits no distension. There is no tenderness. no masses Musculoskeletal: Back: full range of motion of thoracic and lumbar spine. Non tender to palpation. Negative straight leg raise. DTR's are symmetrically intact. Sensation intact in all dermatomes of the lower extremities. Full strength to manual muscle testing. patient is able to heel toe walk without difficulty and ambulates with antalgic gait. R hip with Normal range of motion, no joint effusions. No gross deformities but painful groin and prox thigh with ext rot Neurological: he is alert and oriented to person, place, and time. No cranial nerve deficit. Coordination, balance, strength, speech and gait are normal.  Skin: Skin is warm and dry. No rash noted. No erythema.  Psychiatric: he has a normal mood and affect. behavior is normal. Judgment and thought content normal.   Lab Results  Component Value Date   WBC 4.4* 03/02/2013   HGB 14.1 03/02/2013   HCT 41.8 03/02/2013   PLT 216.0 03/02/2013  GLUCOSE 122* 12/31/2012   CHOL 172 03/02/2013   TRIG 155.0* 03/02/2013   HDL 43.20 03/02/2013   LDLDIRECT 153.1 06/09/2011   LDLCALC 98 03/02/2013   ALT 12 12/31/2012   AST 17 12/31/2012   NA 135 12/31/2012   K 3.8 12/31/2012   CL 98 12/31/2012   CREATININE 0.81 12/31/2012   BUN 13 12/31/2012   CO2 30 12/31/2012   TSH 3.34 03/02/2013   PSA 5.72* 12/15/2011   INR 3.2 01/19/2013    No results found.     Assessment & Plan:   CPX/v70.0 - Patient has been counseled on age-appropriate routine  health concerns for screening and prevention. These are reviewed and up-to-date. Immunizations are up-to-date or declined. Labs and ECG reviewed. Refer for colo - reports due 89yr because of polyp on last in [place] in 2007  Problem List Items Addressed This Visit   HYPERTENSION      BP Readings from Last 3 Encounters:  03/30/14 130/82  12/30/13 130/81  03/02/13 132/82   started amlodipine 5mg  qd 11/2012 Educated on weight, diet and exercise to control same The current medical regimen is generally effective;  continue present plan and medications.      HYPOTHYROIDISM      The current medical regimen is effective;  continue present plan and medications. Lab Results  Component Value Date   TSH 3.34 03/02/2013      OBESITY     Weight trend reviewed The patient is asked to make an attempt to improve diet and exercise patterns to aid in medical management of this problem.     Other Visit Diagnoses   Routine general medical examination at a health care facility    -  Primary    Relevant Orders       Basic metabolic panel       CBC with Differential       Hepatic function panel       Lipid panel       TSH       Urinalysis, Routine w reflex microscopic    Special screening for malignant neoplasms, colon        Relevant Orders       Ambulatory referral to Gastroenterology

## 2014-04-03 ENCOUNTER — Other Ambulatory Visit (HOSPITAL_COMMUNITY): Payer: Self-pay | Admitting: Orthopedic Surgery

## 2014-04-03 DIAGNOSIS — M25551 Pain in right hip: Secondary | ICD-10-CM

## 2014-04-10 ENCOUNTER — Other Ambulatory Visit (HOSPITAL_COMMUNITY): Payer: Self-pay | Admitting: Orthopedic Surgery

## 2014-04-10 ENCOUNTER — Ambulatory Visit (HOSPITAL_COMMUNITY)
Admission: RE | Admit: 2014-04-10 | Discharge: 2014-04-10 | Disposition: A | Discharge status: Home / Self Care | Payer: 59 | Source: Ambulatory Visit | Attending: Orthopedic Surgery | Admitting: Orthopedic Surgery

## 2014-04-10 DIAGNOSIS — M25551 Pain in right hip: Secondary | ICD-10-CM

## 2014-04-10 DIAGNOSIS — M25559 Pain in unspecified hip: Secondary | ICD-10-CM | POA: Insufficient documentation

## 2014-04-10 DIAGNOSIS — N400 Enlarged prostate without lower urinary tract symptoms: Secondary | ICD-10-CM | POA: Insufficient documentation

## 2014-04-10 DIAGNOSIS — M259 Joint disorder, unspecified: Secondary | ICD-10-CM | POA: Insufficient documentation

## 2014-04-10 DIAGNOSIS — Z96649 Presence of unspecified artificial hip joint: Secondary | ICD-10-CM | POA: Insufficient documentation

## 2014-04-20 ENCOUNTER — Ambulatory Visit: Payer: 59 | Attending: Orthopedic Surgery

## 2014-04-20 DIAGNOSIS — M25559 Pain in unspecified hip: Secondary | ICD-10-CM | POA: Insufficient documentation

## 2014-04-20 DIAGNOSIS — IMO0001 Reserved for inherently not codable concepts without codable children: Secondary | ICD-10-CM | POA: Insufficient documentation

## 2014-04-25 ENCOUNTER — Ambulatory Visit: Payer: 59

## 2014-04-27 ENCOUNTER — Ambulatory Visit: Payer: 59 | Admitting: Physical Therapy

## 2014-05-02 ENCOUNTER — Ambulatory Visit: Payer: 59 | Attending: Orthopedic Surgery | Admitting: Physical Therapy

## 2014-05-02 DIAGNOSIS — M25559 Pain in unspecified hip: Secondary | ICD-10-CM | POA: Insufficient documentation

## 2014-05-02 DIAGNOSIS — IMO0001 Reserved for inherently not codable concepts without codable children: Secondary | ICD-10-CM | POA: Insufficient documentation

## 2014-05-04 ENCOUNTER — Ambulatory Visit: Payer: 59 | Admitting: Physical Therapy

## 2014-05-09 ENCOUNTER — Ambulatory Visit: Payer: 59

## 2014-05-11 ENCOUNTER — Encounter: Payer: Self-pay | Admitting: Internal Medicine

## 2014-05-11 ENCOUNTER — Ambulatory Visit: Payer: 59 | Admitting: Physical Therapy

## 2014-05-15 ENCOUNTER — Ambulatory Visit: Payer: 59 | Admitting: Physical Therapy

## 2014-05-17 ENCOUNTER — Ambulatory Visit: Payer: 59

## 2014-06-09 ENCOUNTER — Other Ambulatory Visit: Payer: Self-pay | Admitting: Urology

## 2014-06-09 DIAGNOSIS — R972 Elevated prostate specific antigen [PSA]: Secondary | ICD-10-CM

## 2014-06-12 ENCOUNTER — Encounter: Payer: Self-pay | Admitting: Internal Medicine

## 2014-06-27 ENCOUNTER — Ambulatory Visit (HOSPITAL_COMMUNITY)
Admission: RE | Admit: 2014-06-27 | Discharge: 2014-06-27 | Disposition: A | Discharge status: Home / Self Care | Payer: 59 | Source: Ambulatory Visit | Attending: Urology | Admitting: Urology

## 2014-06-27 DIAGNOSIS — Z96649 Presence of unspecified artificial hip joint: Secondary | ICD-10-CM | POA: Insufficient documentation

## 2014-06-27 DIAGNOSIS — N402 Nodular prostate without lower urinary tract symptoms: Secondary | ICD-10-CM | POA: Insufficient documentation

## 2014-06-27 DIAGNOSIS — N400 Enlarged prostate without lower urinary tract symptoms: Secondary | ICD-10-CM | POA: Insufficient documentation

## 2014-06-27 DIAGNOSIS — R972 Elevated prostate specific antigen [PSA]: Secondary | ICD-10-CM | POA: Insufficient documentation

## 2014-06-27 LAB — CREATININE, SERUM
Creatinine, Ser: 0.78 mg/dL (ref 0.50–1.35)
GFR calc Af Amer: >90 mL/min (ref >90)
GFR calc non Af Amer: >90 mL/min (ref >90)

## 2014-06-27 MED ORDER — GADOBENATE DIMEGLUMINE 529 MG/ML IV SOLN
20.0000 mL | Freq: Once | INTRAVENOUS | Status: AC | PRN
  Administered 2014-06-27: 20 mL via INTRAVENOUS

## 2014-07-31 ENCOUNTER — Other Ambulatory Visit: Payer: Self-pay | Admitting: Internal Medicine

## 2014-11-27 ENCOUNTER — Other Ambulatory Visit: Payer: Self-pay | Admitting: Internal Medicine

## 2015-01-02 ENCOUNTER — Telehealth: Payer: Self-pay | Admitting: Internal Medicine

## 2015-01-02 DIAGNOSIS — Z1211 Encounter for screening for malignant neoplasm of colon: Secondary | ICD-10-CM

## 2015-01-02 NOTE — Telephone Encounter (Signed)
Patient is requesting referral for routine colonoscopy for a Monday.

## 2015-01-02 NOTE — Telephone Encounter (Signed)
Referral done

## 2015-01-30 ENCOUNTER — Telehealth: Payer: Self-pay | Admitting: Internal Medicine

## 2015-02-13 NOTE — Telephone Encounter (Signed)
Records approved by Dr. Hilarie Fredrickson.  Appt made.

## 2015-02-14 ENCOUNTER — Encounter: Payer: Self-pay | Admitting: Internal Medicine

## 2015-04-02 ENCOUNTER — Encounter: Payer: 59 | Admitting: Internal Medicine

## 2015-04-16 ENCOUNTER — Ambulatory Visit (AMBULATORY_SURGERY_CENTER): Payer: Self-pay | Admitting: *Deleted

## 2015-04-16 VITALS — Ht 72.0 in | Wt 279.0 lb

## 2015-04-16 DIAGNOSIS — Z86010 Personal history of colonic polyps: Secondary | ICD-10-CM

## 2015-04-16 MED ORDER — MOVIPREP 100 G PO SOLR: 1.0000 | Freq: Once | ORAL | Status: DC

## 2015-04-16 NOTE — Progress Notes (Signed)
No egg or soy allergy. No anesthesia problems.  No home O2.  No diet meds.  

## 2015-04-23 ENCOUNTER — Telehealth: Payer: Self-pay | Admitting: Internal Medicine

## 2015-04-23 NOTE — Telephone Encounter (Signed)
Spoke with wife, she states Moviprep is $75 and she knows there is a cheaper prep he can use because she is a [employer] scientist. Explained to her that the Moviprep was the prep Dr.Pyrtle likes to use and that it was safe for the patient. She still wants alternative prep. Miralax split dose prep explained to patient's wife and new instructions mail today. Wife aware.

## 2015-04-29 HISTORY — PX: COLONOSCOPY: SHX174

## 2015-04-30 ENCOUNTER — Encounter: Payer: Self-pay | Admitting: Internal Medicine

## 2015-04-30 ENCOUNTER — Ambulatory Visit (AMBULATORY_SURGERY_CENTER): Payer: 59 | Admitting: Internal Medicine

## 2015-04-30 ENCOUNTER — Other Ambulatory Visit: Payer: Self-pay | Admitting: Internal Medicine

## 2015-04-30 VITALS — BP 140/85 | HR 51 | Temp 97.7°F | Resp 16 | Ht 72.0 in | Wt 279.0 lb

## 2015-04-30 DIAGNOSIS — Z86010 Personal history of colonic polyps: Secondary | ICD-10-CM

## 2015-04-30 MED ORDER — SODIUM CHLORIDE 0.9 % IV SOLN: 500.0000 mL | INTRAVENOUS | Status: DC

## 2015-04-30 NOTE — Patient Instructions (Signed)
Discharge instructions given. Normal exam. Resume previous exam. YOU HAD AN ENDOSCOPIC PROCEDURE TODAY AT [facility]:   Refer to the procedure report that was given to you for any specific questions about what was found during the examination.  If the procedure report does not answer your questions, please call your gastroenterologist to clarify.  If you requested that your care partner not be given the details of your procedure findings, then the procedure report has been included in a sealed envelope for you to review at your convenience later.  YOU SHOULD EXPECT: Some feelings of bloating in the abdomen. Passage of more gas than usual.  Walking can help get rid of the air that was put into your GI tract during the procedure and reduce the bloating. If you had a lower endoscopy (such as a colonoscopy or flexible sigmoidoscopy) you may notice spotting of blood in your stool or on the toilet paper. If you underwent a bowel prep for your procedure, you may not have a normal bowel movement for a few days.  Please Note:  You might notice some irritation and congestion in your nose or some drainage.  This is from the oxygen used during your procedure.  There is no need for concern and it should clear up in a day or so.  SYMPTOMS TO REPORT IMMEDIATELY:   Following lower endoscopy (colonoscopy or flexible sigmoidoscopy):  Excessive amounts of blood in the stool  Significant tenderness or worsening of abdominal pains  Swelling of the abdomen that is new, acute  Fever of 100F or higher   For urgent or emergent issues, a gastroenterologist can be reached at any hour by calling [phone].   DIET: Your first meal following the procedure should be a small meal and then it is ok to progress to your normal diet. Heavy or fried foods are harder to digest and may make you feel nauseous or bloated.  Likewise, meals heavy in dairy and vegetables can increase bloating.  Drink plenty of  fluids but you should avoid alcoholic beverages for 24 hours.  ACTIVITY:  You should plan to take it easy for the rest of today and you should NOT DRIVE or use heavy machinery until tomorrow (because of the sedation medicines used during the test).    FOLLOW UP: Our staff will call the number listed on your records the next business day following your procedure to check on you and address any questions or concerns that you may have regarding the information given to you following your procedure. If we do not reach you, we will leave a message.  However, if you are feeling well and you are not experiencing any problems, there is no need to return our call.  We will assume that you have returned to your regular daily activities without incident.  If any biopsies were taken you will be contacted by phone or by letter within the next 1-3 weeks.  Please call us at [phone] if you have not heard about the biopsies in 3 weeks.    SIGNATURES/CONFIDENTIALITY: You and/or your care partner have signed paperwork which will be entered into your electronic medical record.  These signatures attest to the fact that that the information above on your After Visit Summary has been reviewed and is understood.  Full responsibility of the confidentiality of this discharge information lies with you and/or your care-partner.

## 2015-04-30 NOTE — Op Note (Signed)
[facility]  [address] [place], [address]   COLONOSCOPY PROCEDURE REPORT  PATIENT: [name], [name]  MR#: [id number] BIRTHDATE: 12/21/1951 , 89  yrs. old GENDER: male ENDOSCOPIST: Jerene Bears, MD REFERRED MC:[id number] Asa Lente, M.D. PROCEDURE DATE:  04/30/2015 PROCEDURE:   Colonoscopy, screening First Screening Colonoscopy - Avg.  risk and is 50 yrs.  old or older - No.  Prior Negative Screening - Now for repeat screening. N/A  History of Adenoma - Now for follow-up colonoscopy & has been > or = to 3 yrs.  N/A ASA CLASS:   Class II INDICATIONS:Surveillance due to prior colonic neoplasia (reported history of small colon polyp removed approximately 2007 in [place], [place]). MEDICATIONS: Monitored anesthesia care and Propofol 250 mg IV  DESCRIPTION OF PROCEDURE:   After the risks benefits and alternatives of the procedure were thoroughly explained, informed consent was obtained.  The digital rectal exam revealed no rectal mass.   The LB [id number]  endoscope was introduced through the anus and advanced to the cecum, which was identified by both the appendix and ileocecal valve. No adverse events experienced. The quality of the prep was good.  (MiraLax was used)  The instrument was then slowly withdrawn as the colon was fully examined.      COLON FINDINGS: A normal appearing cecum, ileocecal valve, and appendiceal orifice were identified.  The ascending, transverse, descending, sigmoid colon, and rectum appeared unremarkable. Redundant sigmoid colon. Retroflexed views revealed internal hemorrhoids. The time to cecum = 2.7 Withdrawal time = 14.9   The scope was withdrawn and the procedure completed.  COMPLICATIONS: There were no immediate complications.  ENDOSCOPIC IMPRESSION: Normal colonoscopy  RECOMMENDATIONS: You should continue to follow colorectal cancer screening guidelines for "routine risk" patients with a repeat colonoscopy in 10  years. There is no need for FOBT (stool) testing for at least 5 years.  eSigned:  Jerene Bears, MD 04/30/2015 9:34 AM   cc: Rowe Clack, MD and The Patient

## 2015-04-30 NOTE — Progress Notes (Signed)
Report to PACU, RN, vss, BBS= Clear.  

## 2015-05-01 ENCOUNTER — Telehealth: Payer: Self-pay

## 2015-05-01 NOTE — Telephone Encounter (Signed)
  Follow up Call-  Call back number 04/30/2015  Post procedure Call Back phone  # [phone]  Permission to leave phone message Yes     Patient questions:  Do you have a fever, pain , or abdominal swelling? No. Pain Score  0 *  Have you tolerated food without any problems? Yes.    Have you been able to return to your normal activities? Yes.    Do you have any questions about your discharge instructions: Diet   No. Medications  No. Follow up visit  No.  Do you have questions or concerns about your Care? No.  Actions: * If pain score is 4 or above: No action needed, pain <4.

## 2015-05-24 ENCOUNTER — Encounter: Payer: 59 | Admitting: Internal Medicine

## 2015-05-29 ENCOUNTER — Ambulatory Visit (INDEPENDENT_AMBULATORY_CARE_PROVIDER_SITE_OTHER): Payer: 59 | Admitting: Internal Medicine

## 2015-05-29 ENCOUNTER — Other Ambulatory Visit (INDEPENDENT_AMBULATORY_CARE_PROVIDER_SITE_OTHER): Payer: 59

## 2015-05-29 ENCOUNTER — Encounter: Payer: Self-pay | Admitting: Internal Medicine

## 2015-05-29 VITALS — BP 124/82 | HR 63 | Temp 97.9°F | Ht 72.0 in | Wt 273.2 lb

## 2015-05-29 DIAGNOSIS — E669 Obesity, unspecified: Secondary | ICD-10-CM

## 2015-05-29 DIAGNOSIS — Z1159 Encounter for screening for other viral diseases: Secondary | ICD-10-CM

## 2015-05-29 DIAGNOSIS — Z114 Encounter for screening for human immunodeficiency virus [HIV]: Secondary | ICD-10-CM

## 2015-05-29 DIAGNOSIS — Z0000 Encounter for general adult medical examination without abnormal findings: Secondary | ICD-10-CM

## 2015-05-29 DIAGNOSIS — I10 Essential (primary) hypertension: Secondary | ICD-10-CM | POA: Diagnosis not present

## 2015-05-29 DIAGNOSIS — E039 Hypothyroidism, unspecified: Secondary | ICD-10-CM

## 2015-05-29 LAB — BASIC METABOLIC PANEL
BUN: 13 mg/dL (ref 6–23)
CO2: 27 mEq/L (ref 19–32)
Calcium: 9.6 mg/dL (ref 8.4–10.5)
Chloride: 104 mEq/L (ref 96–112)
Creatinine, Ser: 0.83 mg/dL (ref 0.40–1.50)
GFR: 99.81 mL/min (ref >60.00)
Glucose, Bld: 100 mg/dL — ABNORMAL HIGH (ref 70–99)
Potassium: 4.4 mEq/L (ref 3.5–5.1)
Sodium: 136 mEq/L (ref 135–145)

## 2015-05-29 LAB — HEPATIC FUNCTION PANEL
ALT: 24 U/L (ref 0–53)
AST: 20 U/L (ref 0–37)
Albumin: 4.4 g/dL (ref 3.5–5.2)
Alkaline Phosphatase: 76 U/L (ref 39–117)
Bilirubin, Direct: 0.1 mg/dL (ref 0.0–0.3)
Total Bilirubin: 0.6 mg/dL (ref 0.2–1.2)
Total Protein: 7.3 g/dL (ref 6.0–8.3)

## 2015-05-29 LAB — URINALYSIS, ROUTINE W REFLEX MICROSCOPIC
Bilirubin Urine: NEGATIVE
Hgb urine dipstick: NEGATIVE
Ketones, ur: NEGATIVE
Leukocytes, UA: NEGATIVE
Nitrite: NEGATIVE
Specific Gravity, Urine: >=1.03 — AB (ref 1.000–1.030)
Total Protein, Urine: NEGATIVE
Urine Glucose: NEGATIVE
Urobilinogen, UA: 0.2 (ref 0.0–1.0)
pH: 6 (ref 5.0–8.0)

## 2015-05-29 LAB — CBC WITH DIFFERENTIAL/PLATELET
Basophils Absolute: 0 10*3/uL (ref 0.0–0.1)
Basophils Relative: 0.8 % (ref 0.0–3.0)
Eosinophils Absolute: 0.2 10*3/uL (ref 0.0–0.7)
Eosinophils Relative: 2.9 % (ref 0.0–5.0)
HCT: 45.3 % (ref 39.0–52.0)
Hemoglobin: 15.7 g/dL (ref 13.0–17.0)
Lymphocytes Relative: 25.1 % (ref 12.0–46.0)
Lymphs Abs: 1.4 10*3/uL (ref 0.7–4.0)
MCHC: 34.6 g/dL (ref 30.0–36.0)
MCV: 88.3 fl (ref 78.0–100.0)
Monocytes Absolute: 0.6 10*3/uL (ref 0.1–1.0)
Monocytes Relative: 11.3 % (ref 3.0–12.0)
Neutro Abs: 3.3 10*3/uL (ref 1.4–7.7)
Neutrophils Relative %: 59.9 % (ref 43.0–77.0)
Platelets: 189 10*3/uL (ref 150.0–400.0)
RBC: 5.13 Mil/uL (ref 4.22–5.81)
RDW: 13.5 % (ref 11.5–15.5)
WBC: 5.6 10*3/uL (ref 4.0–10.5)

## 2015-05-29 LAB — LIPID PANEL
Cholesterol: 180 mg/dL (ref 0–200)
HDL: 46.8 mg/dL (ref >39.00)
LDL Cholesterol: 108 mg/dL — ABNORMAL HIGH (ref 0–99)
NonHDL: 133.2
Total CHOL/HDL Ratio: 4
Triglycerides: 126 mg/dL (ref 0.0–149.0)
VLDL: 25.2 mg/dL (ref 0.0–40.0)

## 2015-05-29 LAB — TSH: TSH: 3.04 u[IU]/mL (ref 0.35–4.50)

## 2015-05-29 MED ORDER — AMLODIPINE BESYLATE 5 MG PO TABS: 5.0000 mg | ORAL_TABLET | Freq: Every day | ORAL | Status: DC

## 2015-05-29 MED ORDER — LEVOTHYROXINE SODIUM 125 MCG PO TABS: 125.0000 ug | ORAL_TABLET | Freq: Every day | ORAL | Status: DC

## 2015-05-29 NOTE — Progress Notes (Signed)
Subjective:    Patient ID: [name], male    DOB: Jan 14, 1951, 64 y.o.   MRN: [id number]  HPI  patient is here today for annual physical. Patient feels well overall Reviewed chronic medical conditions, interval events and current concerns.  Patient Care Team: Rowe Clack, MD as PCP - General Gaynelle Arabian, MD as Consulting Physician (Orthopedic Surgery) Carolan Clines, MD (Urology) Jerene Bears, MD as Consulting Physician (Gastroenterology)   Past Medical History  Diagnosis Date  . COLONIC POLYPS, HX OF 2007    clear colo w/o polyps 04/2015: 69yr follow up  . Arthritis     Knees  . OBESITY   . HYPOTHYROIDISM   . HYPERTENSION   . BPH (benign prostatic hypertrophy)    Family History  Problem Relation Age of Onset  . Heart disease Father 52    CABG age 66  . Hypertension Father   . Arthritis Other     Parent & grandparents  . Colon cancer Neg Hx   . Dementia Mother     ?ALS vs SNP  . ALS Maternal Grandfather    History  Substance Use Topics  . Smoking status: Never Smoker   . Smokeless tobacco: Never Used  . Alcohol Use: 0.0 oz/week    0 Standard drinks or equivalent per week     Comment: rarely, social    Review of Systems  Constitutional: Negative for fever, activity change, appetite change, fatigue and unexpected weight change.  Respiratory: Negative for cough, chest tightness, shortness of breath and wheezing.   Cardiovascular: Negative for chest pain, palpitations and leg swelling.  Musculoskeletal: Positive for back pain (with exertion (mowing)). Negative for joint swelling and gait problem.  Neurological: Negative for dizziness, weakness and headaches.  Psychiatric/Behavioral: Negative for dysphoric mood. The patient is not nervous/anxious.   All other systems reviewed and are negative.      Objective:    Physical Exam  Constitutional: He is oriented to person, place, and time. He appears well-developed and well-nourished. No  distress.  obese  HENT:  Head: Normocephalic and atraumatic.  Nose: Nose normal.  Mouth/Throat: Oropharynx is clear and moist.  Hearing grossly normal.  Eyes: Conjunctivae and EOM are normal. Pupils are equal, round, and reactive to light. No scleral icterus.  Neck: Normal range of motion. Neck supple. No JVD present. No thyromegaly present.  Cardiovascular: Normal rate, regular rhythm, normal heart sounds and intact distal pulses.  Exam reveals no friction rub.   No murmur heard. No edema.  Pulmonary/Chest: Effort normal and breath sounds normal. No respiratory distress. He has no wheezes.  Abdominal: Soft. Bowel sounds are normal. He exhibits no distension and no mass. There is no tenderness. There is no guarding.  Genitourinary:  defer  Musculoskeletal: Normal range of motion. He exhibits no edema or tenderness.  Lymphadenopathy:    He has no cervical adenopathy.  Neurological: He is alert and oriented to person, place, and time. He has normal reflexes. No cranial nerve deficit.  Skin: Skin is warm and dry. No rash noted. No erythema.  Psychiatric: He has a normal mood and affect. His behavior is normal. Thought content normal.  Vitals reviewed.   BP 124/82 mmHg  Pulse 63  Temp(Src) 97.9 F (36.6 C) (Oral)  Ht 6' (1.829 m)  Wt 273 lb 4 oz (123.945 kg)  BMI 37.05 kg/m2  SpO2 94% Wt Readings from Last 3 Encounters:  05/29/15 273 lb 4 oz (123.945 kg)  04/30/15 279 lb (  126.554 kg)  04/16/15 279 lb (126.554 kg)     Lab Results  Component Value Date   WBC 5.7 03/30/2014   HGB 14.9 03/30/2014   HCT 43.7 03/30/2014   PLT 230.0 03/30/2014   GLUCOSE 97 03/30/2014   CHOL 161 03/30/2014   TRIG 85.0 03/30/2014   HDL 38.90* 03/30/2014   LDLDIRECT 153.1 06/09/2011   LDLCALC 105* 03/30/2014   ALT 22 03/30/2014   AST 17 03/30/2014   NA 137 03/30/2014   K 4.0 03/30/2014   CL 104 03/30/2014   CREATININE 0.78 06/27/2014   BUN 16 03/30/2014   CO2 24 03/30/2014   TSH 2.61  03/30/2014   PSA 5.72* 12/15/2011   INR 3.2 01/19/2013    Mr Prostate W / Wo Cm  06/27/2014   CLINICAL DATA:  Persistent elevated PSA level.  Negative biopsy.  EXAM: MR PROSTATE WITHOUT AND WITH CONTRAST  TECHNIQUE: Multiplanar multisequence MRI images were obtained of the pelvis centered about the prostate. Pre and post contrast images were obtained.  CONTRAST:  78mL MULTIHANCE GADOBENATE DIMEGLUMINE 529 MG/ML IV SOLN  COMPARISON:  None.  FINDINGS: Metal artifact from right hip prosthesis limits evaluation of the prostate, particularly on diffusion-weighted imaging sequences.  Prostate: Mild to moderate enlargement. Central gland enlargement demonstrated with multiple PP age nodules. No suspicious central gland nodules visualized.  Peripheral zone shows a small central prostatic utricle cyst. Right peripheral zone evaluation is limited by right hip prosthesis artifact. There is some very poorly defined T2 hypointensity in the right central zone, but no discrete nodule visualized.  In the left central base of the peripheral zone, there is a ill-defined T2 hypo intense nodule measuring 6 x 8 mm on image 15 of series 6. This nodule appears to show restricted diffusion and hypervascular enhancement despite right hip prosthesis artifact, suspicious for a focus of high-grade carcinoma.  Transcapsular spread:  Absent  Seminal vesicle involvement: Absent  Neurovascular bundle involvement: Absent  Pelvic adenopathy: Absent  Bone metastasis: Absent  Other findings: None  IMPRESSION: 8 mm nodule in the left central base of the peripheral zone, suspicious for small focus of high-grade carcinoma. If repeat biopsy is performed, recommend targeting of this nodule. No evidence of extracapsular extension or pelvic metastatic disease.  Suboptimal evaluation due to right hip prosthesis artifact, particularly affecting the right side of the prostate gland.   Electronically Signed   By: Earle Gell M.D.   On: 06/27/2014 15:50        Assessment & Plan:   Problem List Items Addressed This Visit    Essential hypertension    BP Readings from Last 3 Encounters:  05/29/15 124/82  04/30/15 140/85  03/30/14 130/82   started amlodipine 5mg  qd 11/2012 Educated on weight, diet and exercise to control same The current medical regimen is generally effective;  continue present plan and medications.       Relevant Medications   amLODipine (NORVASC) 5 MG tablet   Hypothyroidism    The current medical regimen is effective;  continue present plan and medications. Lab Results  Component Value Date   TSH 2.61 03/30/2014        Relevant Medications   levothyroxine (SYNTHROID, LEVOTHROID) 125 MCG tablet   Obesity    Wt Readings from Last 3 Encounters:  05/29/15 273 lb 4 oz (123.945 kg)  04/30/15 279 lb (126.554 kg)  04/16/15 279 lb (126.554 kg)   Body mass index is 37.05 kg/(m^2).  Weight trend reviewed The patient is  asked to make an attempt to improve diet and exercise patterns to aid in medical management of this problem.       Other Visit Diagnoses    Routine general medical examination at a health care facility    -  Primary    Relevant Orders    Basic metabolic panel    CBC with Differential/Platelet    Hepatic function panel    Lipid panel    TSH    Urinalysis, Routine w reflex microscopic (not at [facility])    Need for hepatitis C screening test        Relevant Orders    Hepatitis C antibody    Encounter for screening for HIV        Relevant Orders    HIV antibody        Gwendolyn Grant, MD

## 2015-05-29 NOTE — Assessment & Plan Note (Signed)
BP Readings from Last 3 Encounters:  05/29/15 124/82  04/30/15 140/85  03/30/14 130/82   started amlodipine 5mg  qd 11/2012 Educated on weight, diet and exercise to control same The current medical regimen is generally effective;  continue present plan and medications.

## 2015-05-29 NOTE — Progress Notes (Signed)
Pre visit review using our clinic review tool, if applicable. No additional management support is needed unless otherwise documented below in the visit note. 

## 2015-05-29 NOTE — Patient Instructions (Addendum)
It was good to see you today.  We have reviewed your prior records including labs and tests today  Health Maintenance reviewed - all recommended immunizations and age-appropriate screenings are up-to-date.  Test(s) ordered today. Your results will be released to [place] (or called to you) after review, usually within 72hours after test completion. If any changes need to be made, you will be notified at that same time.  Medications reviewed and updated, no changes recommended at this time.  Please schedule followup in 12 months for annual exam and labs, call sooner if problems.  Health Maintenance A healthy lifestyle and preventative care can promote health and wellness.  Maintain regular health, dental, and eye exams.  Eat a healthy diet. Foods like vegetables, fruits, whole grains, low-fat dairy products, and lean protein foods contain the nutrients you need and are low in calories. Decrease your intake of foods high in solid fats, added sugars, and salt. Get information about a proper diet from your health care provider, if necessary.  Regular physical exercise is one of the most important things you can do for your health. Most adults should get at least 150 minutes of moderate-intensity exercise (any activity that increases your heart rate and causes you to sweat) each week. In addition, most adults need muscle-strengthening exercises on 2 or more days a week.   Maintain a healthy weight. The body mass index (BMI) is a screening tool to identify possible weight problems. It provides an estimate of body fat based on height and weight. Your health care provider can find your BMI and can help you achieve or maintain a healthy weight. For males 20 years and older:  A BMI below 18.5 is considered underweight.  A BMI of 18.5 to 24.9 is normal.  A BMI of 25 to 29.9 is considered overweight.  A BMI of 30 and above is considered obese.  Maintain normal blood lipids and cholesterol by  exercising and minimizing your intake of saturated fat. Eat a balanced diet with plenty of fruits and vegetables. Blood tests for lipids and cholesterol should begin at age 34 and be repeated every 5 years. If your lipid or cholesterol levels are high, you are over age 59, or you are at high risk for heart disease, you may need your cholesterol levels checked more frequently.Ongoing high lipid and cholesterol levels should be treated with medicines if diet and exercise are not working.  If you smoke, find out from your health care provider how to quit. If you do not use tobacco, do not start.  Lung cancer screening is recommended for adults aged 73-80 years who are at high risk for developing lung cancer because of a history of smoking. A yearly low-dose CT scan of the lungs is recommended for people who have at least a 30-pack-year history of smoking and are current smokers or have quit within the past 15 years. A pack year of smoking is smoking an average of 1 pack of cigarettes a day for 1 year (for example, a 30-pack-year history of smoking could mean smoking 1 pack a day for 30 years or 2 packs a day for 15 years). Yearly screening should continue until the smoker has stopped smoking for at least 15 years. Yearly screening should be stopped for people who develop a health problem that would prevent them from having lung cancer treatment.  If you choose to drink alcohol, do not have more than 2 drinks per day. One drink is considered to be 12 oz (  360 mL) of beer, 5 oz (150 mL) of wine, or 1.5 oz (45 mL) of liquor.  Avoid the use of street drugs. Do not share needles with anyone. Ask for help if you need support or instructions about stopping the use of drugs.  High blood pressure causes heart disease and increases the risk of stroke. Blood pressure should be checked at least every 1-2 years. Ongoing high blood pressure should be treated with medicines if weight loss and exercise are not  effective.  If you are 43-67 years old, ask your health care provider if you should take aspirin to prevent heart disease.  Diabetes screening involves taking a blood sample to check your fasting blood sugar level. This should be done once every 3 years after age 56 if you are at a normal weight and without risk factors for diabetes. Testing should be considered at a younger age or be carried out more frequently if you are overweight and have at least 1 risk factor for diabetes.  Colorectal cancer can be detected and often prevented. Most routine colorectal cancer screening begins at the age of 13 and continues through age 12. However, your health care provider may recommend screening at an earlier age if you have risk factors for colon cancer. On a yearly basis, your health care provider may provide home test kits to check for hidden blood in the stool. A small camera at the end of a tube may be used to directly examine the colon (sigmoidoscopy or colonoscopy) to detect the earliest forms of colorectal cancer. Talk to your health care provider about this at age 93 when routine screening begins. A direct exam of the colon should be repeated every 5-10 years through age 20, unless early forms of precancerous polyps or small growths are found.  People who are at an increased risk for hepatitis B should be screened for this virus. You are considered at high risk for hepatitis B if:  You were born in a country where hepatitis B occurs often. Talk with your health care provider about which countries are considered high risk.  Your parents were born in a high-risk country and you have not received a shot to protect against hepatitis B (hepatitis B vaccine).  You have HIV or AIDS.  You use needles to inject street drugs.  You live with, or have sex with, someone who has hepatitis B.  You are a man who has sex with other men (MSM).  You get hemodialysis treatment.  You take certain medicines for  conditions like cancer, organ transplantation, and autoimmune conditions.  Hepatitis C blood testing is recommended for all people born from [birth year] through 1963 and any individual with known risk factors for hepatitis C.  Healthy men should no longer receive prostate-specific antigen (PSA) blood tests as part of routine cancer screening. Talk to your health care provider about prostate cancer screening.  Testicular cancer screening is not recommended for adolescents or adult males who have no symptoms. Screening includes self-exam, a health care provider exam, and other screening tests. Consult with your health care provider about any symptoms you have or any concerns you have about testicular cancer.  Practice safe sex. Use condoms and avoid high-risk sexual practices to reduce the spread of sexually transmitted infections (STIs).  You should be screened for STIs, including gonorrhea and chlamydia if:  You are sexually active and are younger than 24 years.  You are older than 24 years, and your health care provider tells you  that you are at risk for this type of infection.  Your sexual activity has changed since you were last screened, and you are at an increased risk for chlamydia or gonorrhea. Ask your health care provider if you are at risk.  If you are at risk of being infected with HIV, it is recommended that you take a prescription medicine daily to prevent HIV infection. This is called pre-exposure prophylaxis (PrEP). You are considered at risk if:  You are a man who has sex with other men (MSM).  You are a heterosexual man who is sexually active with multiple partners.  You take drugs by injection.  You are sexually active with a partner who has HIV.  Talk with your health care provider about whether you are at high risk of being infected with HIV. If you choose to begin PrEP, you should first be tested for HIV. You should then be tested every 3 months for as long as you are taking  PrEP.  Use sunscreen. Apply sunscreen liberally and repeatedly throughout the day. You should seek shade when your shadow is shorter than you. Protect yourself by wearing long sleeves, pants, a wide-brimmed hat, and sunglasses year round whenever you are outdoors.  Tell your health care provider of new moles or changes in moles, especially if there is a change in shape or color. Also, tell your health care provider if a mole is larger than the size of a pencil eraser.  A one-time screening for abdominal aortic aneurysm (AAA) and surgical repair of large AAAs by ultrasound is recommended for men aged 45-75 years who are current or former smokers.  Stay current with your vaccines (immunizations). Document Released: 06/12/2008 Document Revised: 12/20/2013 Document Reviewed: 05/12/2011 [facility] Patient Information 2015 [place], [place]. This information is not intended to replace advice given to you by your health care provider. Make sure you discuss any questions you have with your health care provider.

## 2015-05-29 NOTE — Assessment & Plan Note (Signed)
Wt Readings from Last 3 Encounters:  05/29/15 273 lb 4 oz (123.945 kg)  04/30/15 279 lb (126.554 kg)  04/16/15 279 lb (126.554 kg)   Body mass index is 37.05 kg/(m^2).  Weight trend reviewed The patient is asked to make an attempt to improve diet and exercise patterns to aid in medical management of this problem.

## 2015-05-29 NOTE — Assessment & Plan Note (Signed)
The current medical regimen is effective;  continue present plan and medications. Lab Results  Component Value Date   TSH 2.61 03/30/2014

## 2015-05-30 LAB — HEPATITIS C ANTIBODY: HCV Ab: NEGATIVE

## 2015-05-30 LAB — HIV ANTIBODY (ROUTINE TESTING W REFLEX): HIV 1&2 Ab, 4th Generation: NONREACTIVE

## 2015-11-27 ENCOUNTER — Other Ambulatory Visit: Payer: Self-pay | Admitting: Internal Medicine

## 2016-01-03 ENCOUNTER — Other Ambulatory Visit (INDEPENDENT_AMBULATORY_CARE_PROVIDER_SITE_OTHER): Payer: 59

## 2016-01-03 ENCOUNTER — Encounter: Payer: Self-pay | Admitting: Internal Medicine

## 2016-01-03 ENCOUNTER — Ambulatory Visit (INDEPENDENT_AMBULATORY_CARE_PROVIDER_SITE_OTHER): Payer: 59 | Admitting: Internal Medicine

## 2016-01-03 ENCOUNTER — Other Ambulatory Visit: Payer: Self-pay | Admitting: Internal Medicine

## 2016-01-03 VITALS — BP 134/88 | HR 81 | Temp 97.7°F | Ht 72.0 in | Wt 283.0 lb

## 2016-01-03 DIAGNOSIS — I10 Essential (primary) hypertension: Secondary | ICD-10-CM | POA: Diagnosis not present

## 2016-01-03 DIAGNOSIS — J069 Acute upper respiratory infection, unspecified: Secondary | ICD-10-CM

## 2016-01-03 DIAGNOSIS — R42 Dizziness and giddiness: Secondary | ICD-10-CM

## 2016-01-03 LAB — CBC WITH DIFFERENTIAL/PLATELET
Basophils Absolute: 0.1 10*3/uL (ref 0.0–0.1)
Basophils Relative: 0.9 % (ref 0.0–3.0)
Eosinophils Absolute: 0.2 10*3/uL (ref 0.0–0.7)
Eosinophils Relative: 3.3 % (ref 0.0–5.0)
HCT: 46.3 % (ref 39.0–52.0)
Hemoglobin: 15.5 g/dL (ref 13.0–17.0)
Lymphocytes Relative: 27.2 % (ref 12.0–46.0)
Lymphs Abs: 1.7 10*3/uL (ref 0.7–4.0)
MCHC: 33.6 g/dL (ref 30.0–36.0)
MCV: 89.6 fl (ref 78.0–100.0)
Monocytes Absolute: 0.8 10*3/uL (ref 0.1–1.0)
Monocytes Relative: 12.5 % — ABNORMAL HIGH (ref 3.0–12.0)
Neutro Abs: 3.6 10*3/uL (ref 1.4–7.7)
Neutrophils Relative %: 56.1 % (ref 43.0–77.0)
Platelets: 216 10*3/uL (ref 150.0–400.0)
RBC: 5.17 Mil/uL (ref 4.22–5.81)
RDW: 13 % (ref 11.5–15.5)
WBC: 6.4 10*3/uL (ref 4.0–10.5)

## 2016-01-03 LAB — HEPATIC FUNCTION PANEL
ALT: 32 U/L (ref 0–53)
AST: 19 U/L (ref 0–37)
Albumin: 4.5 g/dL (ref 3.5–5.2)
Alkaline Phosphatase: 74 U/L (ref 39–117)
Bilirubin, Direct: 0.1 mg/dL (ref 0.0–0.3)
Total Bilirubin: 0.6 mg/dL (ref 0.2–1.2)
Total Protein: 7.3 g/dL (ref 6.0–8.3)

## 2016-01-03 LAB — BASIC METABOLIC PANEL
BUN: 18 mg/dL (ref 6–23)
CO2: 28 mEq/L (ref 19–32)
Calcium: 10 mg/dL (ref 8.4–10.5)
Chloride: 104 mEq/L (ref 96–112)
Creatinine, Ser: 0.86 mg/dL (ref 0.40–1.50)
GFR: 95.61 mL/min (ref >60.00)
Glucose, Bld: 98 mg/dL (ref 70–99)
Potassium: 4.2 mEq/L (ref 3.5–5.1)
Sodium: 140 mEq/L (ref 135–145)

## 2016-01-03 LAB — URINALYSIS, ROUTINE W REFLEX MICROSCOPIC
Bilirubin Urine: NEGATIVE
Hgb urine dipstick: NEGATIVE
Ketones, ur: NEGATIVE
Leukocytes, UA: NEGATIVE
Nitrite: NEGATIVE
Specific Gravity, Urine: >=1.03 — AB (ref 1.000–1.030)
Total Protein, Urine: NEGATIVE
Urine Glucose: NEGATIVE
Urobilinogen, UA: 0.2 (ref 0.0–1.0)
pH: 5.5 (ref 5.0–8.0)

## 2016-01-03 LAB — TSH: TSH: 5.54 u[IU]/mL — ABNORMAL HIGH (ref 0.35–4.50)

## 2016-01-03 MED ORDER — MECLIZINE HCL 12.5 MG PO TABS
12.5000 mg | ORAL_TABLET | Freq: Three times a day (TID) | ORAL | Status: DC | PRN
Start: 2016-01-03 — End: 2016-05-27

## 2016-01-03 MED ORDER — AZITHROMYCIN 250 MG PO TABS
ORAL_TABLET | ORAL | Status: DC
Start: 2016-01-03 — End: 2016-02-22

## 2016-01-03 MED ORDER — LEVOTHYROXINE SODIUM 137 MCG PO CAPS: ORAL_CAPSULE | ORAL | Status: DC

## 2016-01-03 MED FILL — AZITHROMYCIN 250 MG TABLET: 250 | 5 days supply | Qty: 6 | Fill #0

## 2016-01-03 MED FILL — MECLIZINE 12.5 MG TABLET: 12.5 | 10 days supply | Qty: 30 | Fill #0

## 2016-01-03 NOTE — Progress Notes (Signed)
Subjective:    Patient ID: [name], male    DOB: 03/19/1951, 65 y.o.   MRN: [id number]  HPI     Here with 2-3 days acute onset fever, facial pain, pressure, headache, general weakness and malaise, with mild ST and non prod cough improved today, but pt denies chest pain, wheezing, increased sob or doe, orthopnea, PND, increased LE swelling, palpitations, or syncope but has had several episodes dizziness with room spinning as well, assoc with nausea.  Denies worsening reflux, abd pain, dysphagia, n/v, bowel change or blood..  Usually happened with movement, but today with episode for 20 seconds (long) while standing in place.  Pt denies polydipsia, polyuria, Pt denies new neurological symptoms such as new headache, or facial or extremity weakness or numbness Past Medical History  Diagnosis Date  . COLONIC POLYPS, HX OF 2007    clear colo w/o polyps 04/2015: 80yr follow up  . Arthritis     Knees  . OBESITY   . HYPOTHYROIDISM   . HYPERTENSION   . BPH (benign prostatic hypertrophy)    Past Surgical History  Procedure Laterality Date  . Tonsillectomy  1990    w/ adenoids and uvula  . Laminectomy  1991  . Arthroscopic knee surgery      (R) 2003 & (L) 2010  . Total hip arthroplasty Right 01/2011    [place]  . Tonsillectomy    . Total knee arthroplasty  12/27/2012    Procedure: TOTAL KNEE BILATERAL;  Surgeon: Gearlean Alf, MD;  Location: [facility];  Service: Orthopedics;  Laterality: Bilateral;  . Colonoscopy  04/2015    no polyps ([place])    reports that he has never smoked. He has never used smokeless tobacco. He reports that he drinks alcohol. He reports that he does not use illicit drugs. family history includes ALS in his maternal grandfather; Arthritis in his other; Dementia in his mother; Heart disease (age of onset: 35) in his father; Hypertension in his father. There is no history of Colon cancer. No Known Allergies Current Outpatient Prescriptions on File Prior to Visit    Medication Sig Dispense Refill  . alfuzosin (UROXATRAL) 10 MG 24 hr tablet Take 10 mg by mouth every evening.     Marland Kitchen amLODipine (NORVASC) 5 MG tablet Take 1 tablet (5 mg total) by mouth daily. 90 tablet 0  . aspirin 81 MG tablet Take 81 mg by mouth daily.    . meloxicam (MOBIC) 15 MG tablet Take 15 mg by mouth daily.    . Multiple Vitamins-Minerals (MULTIVITAMIN ADULT PO) Take 1 tablet by mouth daily.    . Omega-3 Fatty Acids (FISH OIL CONCENTRATE PO) Take 1 tablet by mouth daily.     No current facility-administered medications on file prior to visit.   Review of Systems  Constitutional: Negative for unusual diaphoresis or night sweats HENT: Negative for ringing in ear or discharge Eyes: Negative for double vision or worsening visual disturbance.  Respiratory: Negative for choking and stridor.   Gastrointestinal: Negative for vomiting or other signifcant bowel change Genitourinary: Negative for hematuria or change in urine volume.  Musculoskeletal: Negative for other MSK pain or swelling Skin: Negative for color change and worsening wound.  Neurological: Negative for tremors and numbness other than noted  Psychiatric/Behavioral: Negative for decreased concentration or agitation other than above       Objective:   Physical Exam BP 134/88 mmHg  Pulse 81  Temp(Src) 97.7 F (36.5 C) (Oral)  Ht 6' (  1.829 m)  Wt 283 lb (128.368 kg)  BMI 38.37 kg/m2  SpO2 97% VS noted, mild ill Constitutional: Pt appears in no significant distress HENT: Head: NCAT.  Right Ear: External ear normal.  Left Ear: External ear normal.  Eyes: . Pupils are equal, round, and reactive to light. Conjunctivae and EOM are normal Neck: Normal range of motion. Neck supple.  Cardiovascular: Normal rate and regular rhythm.   Pulmonary/Chest: Effort normal and breath sounds without rales or wheezing.  Abd:  Soft, NT, ND, + BS Neurological: Pt is alert. Not confused , motor 5/5 intact, sens/dtr intact Skin: Skin  is warm. No rash, no LE edema Psychiatric: Pt behavior is normal. No agitation.   ECG reviewed as per emr    Assessment & Plan:

## 2016-01-03 NOTE — Assessment & Plan Note (Addendum)
Etiology unclear, exam benign,  ECG reviewed as per emr, also for labs as ordered r/o anemia

## 2016-01-03 NOTE — Progress Notes (Signed)
Pre visit review using our clinic review tool, if applicable. No additional management support is needed unless otherwise documented below in the visit note. 

## 2016-01-03 NOTE — Patient Instructions (Signed)
Please take all new medication as prescribed - the antibiotic, and medication for dizziness  Please continue all other medications as before, and refills have been done if requested.  Please have the pharmacy call with any other refills you may need.  Please keep your appointments with your specialists as you may have planned  Please go to the LAB in the Basement (turn left off the elevator) for the tests to be done today  You will be contacted by phone if any changes need to be made immediately.  Otherwise, you will receive a letter about your results with an explanation, but please check with MyChart first.

## 2016-01-04 ENCOUNTER — Other Ambulatory Visit: Payer: Self-pay

## 2016-01-04 MED ORDER — LEVOTHYROXINE SODIUM 137 MCG PO TABS: 137.0000 ug | ORAL_TABLET | Freq: Every day | ORAL | Status: DC

## 2016-01-04 MED FILL — LEVOTHYROXINE 137 MCG TAB: 137 | 90 days supply | Qty: 90 | Fill #0

## 2016-01-05 DIAGNOSIS — J069 Acute upper respiratory infection, unspecified: Secondary | ICD-10-CM | POA: Insufficient documentation

## 2016-01-05 NOTE — Assessment & Plan Note (Signed)
Mild, improving, likely viral, for mucinex otc prn, tylenol prn, to f/u any worsening symptoms or concerns

## 2016-01-05 NOTE — Assessment & Plan Note (Signed)
stable overall by history and exam, recent data reviewed with pt, and pt to continue medical treatment as before,  to f/u any worsening symptoms or concerns BP Readings from Last 3 Encounters:  01/03/16 134/88  05/29/15 124/82  04/30/15 140/85

## 2016-01-22 DIAGNOSIS — M1612 Unilateral primary osteoarthritis, left hip: Secondary | ICD-10-CM | POA: Diagnosis not present

## 2016-02-07 MED FILL — ALFUZOSIN HCL ER 10 MG TAB: 10 | 90 days supply | Qty: 90 | Fill #2

## 2016-02-07 MED FILL — MECLIZINE 12.5 MG TABLET: 12.5 | 10 days supply | Qty: 30 | Fill #1

## 2016-02-19 DIAGNOSIS — M1612 Unilateral primary osteoarthritis, left hip: Secondary | ICD-10-CM | POA: Diagnosis not present

## 2016-02-22 ENCOUNTER — Encounter: Payer: Self-pay | Admitting: Internal Medicine

## 2016-02-22 ENCOUNTER — Ambulatory Visit (INDEPENDENT_AMBULATORY_CARE_PROVIDER_SITE_OTHER): Payer: 59 | Admitting: Internal Medicine

## 2016-02-22 VITALS — BP 142/86 | HR 74 | Temp 98.5°F | Ht 72.0 in | Wt 282.0 lb

## 2016-02-22 DIAGNOSIS — R42 Dizziness and giddiness: Secondary | ICD-10-CM | POA: Diagnosis not present

## 2016-02-22 DIAGNOSIS — R079 Chest pain, unspecified: Secondary | ICD-10-CM

## 2016-02-22 DIAGNOSIS — R55 Syncope and collapse: Secondary | ICD-10-CM | POA: Insufficient documentation

## 2016-02-22 NOTE — Patient Instructions (Addendum)
Your EKG was OK today  Please continue all other medications as before, and refills have been done if requested.  Please have the pharmacy call with any other refills you may need.  Please continue your efforts at being more active, low cholesterol diet, and weight control.  Please keep your appointments with your specialists as you may have planned  You will be contacted regarding the referral for: Echocardiogram, Stress Test, and carotid evaluation

## 2016-02-22 NOTE — Progress Notes (Signed)
Subjective:    Patient ID: [name], male    DOB: 1951/11/15, 65 y.o.   MRN: [id number]  HPI  Here to f/u, overall doing ok today, and vertigo from previous visit resolved, but states 3 days ago was simply sitting at desk, when suddenly had onset lightheadness and sweats lasting approx 5 min.  Finally seemed to pass, and was able to get up, drive home, BP and HR seemed fine at home.  During this episode - Pt denies chest pain, increased sob or doe, wheezing, orthopnea, PND, increased LE swelling, palpitations, syncope.  Has had intermittent vague chest discomfort mild dull often fleeting for several months.  Last ecg jan 2017 - SR, no acute.  Pt denies new neurological symptoms such as new headache, or facial or extremity weakness or numbness   Pt denies polydipsia, polyuria Past Medical History  Diagnosis Date  . COLONIC POLYPS, HX OF 2007    clear colo w/o polyps 04/2015: 63yr follow up  . Arthritis     Knees  . OBESITY   . HYPOTHYROIDISM   . HYPERTENSION   . BPH (benign prostatic hypertrophy)    Past Surgical History  Procedure Laterality Date  . Tonsillectomy  1990    w/ adenoids and uvula  . Laminectomy  1991  . Arthroscopic knee surgery      (R) 2003 & (L) 2010  . Total hip arthroplasty Right 01/2011    [place]  . Tonsillectomy    . Total knee arthroplasty  12/27/2012    Procedure: TOTAL KNEE BILATERAL;  Surgeon: Gearlean Alf, MD;  Location: [facility];  Service: Orthopedics;  Laterality: Bilateral;  . Colonoscopy  04/2015    no polyps ([place])    reports that he has never smoked. He has never used smokeless tobacco. He reports that he drinks alcohol. He reports that he does not use illicit drugs. family history includes ALS in his maternal grandfather; Arthritis in his other; Dementia in his mother; Heart disease (age of onset: 18) in his father; Hypertension in his father. There is no history of Colon cancer. No Known Allergies Current Outpatient Prescriptions on File  Prior to Visit  Medication Sig Dispense Refill  . alfuzosin (UROXATRAL) 10 MG 24 hr tablet Take 10 mg by mouth every evening.     Marland Kitchen amLODipine (NORVASC) 5 MG tablet Take 1 tablet (5 mg total) by mouth daily. 90 tablet 0  . aspirin 81 MG tablet Take 81 mg by mouth daily.    Marland Kitchen levothyroxine (LEVOTHROID) 137 MCG tablet Take 1 tablet (137 mcg total) by mouth daily before breakfast. 90 tablet 1  . meclizine (ANTIVERT) 12.5 MG tablet Take 1 tablet (12.5 mg total) by mouth 3 (three) times daily as needed for dizziness. 30 tablet 1  . meloxicam (MOBIC) 15 MG tablet Take 15 mg by mouth daily.    . Multiple Vitamins-Minerals (MULTIVITAMIN ADULT PO) Take 1 tablet by mouth daily.    . Omega-3 Fatty Acids (FISH OIL CONCENTRATE PO) Take 1 tablet by mouth daily.     No current facility-administered medications on file prior to visit.   Review of Systems  Constitutional: Negative for unusual diaphoresis or night sweats HENT: Negative for ringing in ear or discharge Eyes: Negative for double vision or worsening visual disturbance.  Respiratory: Negative for choking and stridor.   Gastrointestinal: Negative for vomiting or other signifcant bowel change Genitourinary: Negative for hematuria or change in urine volume.  Musculoskeletal: Negative for other MSK pain  or swelling Skin: Negative for color change and worsening wound.  Neurological: Negative for tremors and numbness other than noted  Psychiatric/Behavioral: Negative for decreased concentration or agitation other than above       Objective:   Physical Exam BP 142/86 mmHg  Pulse 74  Temp(Src) 98.5 F (36.9 C) (Oral)  Ht 6' (1.829 m)  Wt 282 lb (127.914 kg)  BMI 38.24 kg/m2  SpO2 96% VS noted,  Constitutional: Pt appears in no significant distress HENT: Head: NCAT.  Right Ear: External ear normal.  Left Ear: External ear normal.  Eyes: . Pupils are equal, round, and reactive to light. Conjunctivae and EOM are normal Neck: Normal range of  motion. Neck supple.  Cardiovascular: Normal rate and regular rhythm.   Pulmonary/Chest: Effort normal and breath sounds without rales or wheezing.  Abd:  Soft, NT, ND, + BS Neurological: Pt is alert. Not confused , motor grossly intact Skin: Skin is warm. No rash, no LE edema Psychiatric: Pt behavior is normal. No agitation.     Assessment & Plan:

## 2016-02-22 NOTE — Assessment & Plan Note (Signed)
ECG reviewed as per emr, atypical, for stress test,  to f/u any worsening symptoms or concerns

## 2016-02-22 NOTE — Assessment & Plan Note (Addendum)
ECG reviewed as per emr, suspect likely vagal episode without frank syncope, but not clear, ok for carotid and echo eval,  to f/u any worsening symptoms or concerns Lab Results  Component Value Date   WBC 6.4 01/03/2016   HGB 15.5 01/03/2016   HCT 46.3 01/03/2016   PLT 216.0 01/03/2016   GLUCOSE 98 01/03/2016   CHOL 180 05/29/2015   TRIG 126.0 05/29/2015   HDL 46.80 05/29/2015   LDLDIRECT 153.1 06/09/2011   LDLCALC 108* 05/29/2015   ALT 32 01/03/2016   AST 19 01/03/2016   NA 140 01/03/2016   K 4.2 01/03/2016   CL 104 01/03/2016   CREATININE 0.86 01/03/2016   BUN 18 01/03/2016   CO2 28 01/03/2016   TSH 5.54* 01/03/2016   PSA 5.72* 12/15/2011   INR 3.2 01/19/2013

## 2016-02-22 NOTE — Progress Notes (Signed)
Pre visit review using our clinic review tool, if applicable. No additional management support is needed unless otherwise documented below in the visit note. 

## 2016-02-22 NOTE — Assessment & Plan Note (Signed)
Resolved,  to f/u any worsening symptoms or concerns  

## 2016-02-24 MED FILL — AMLODIPINE BESYLATE 5 MG TA: 5 | 90 days supply | Qty: 90 | Fill #1

## 2016-03-05 ENCOUNTER — Telehealth (HOSPITAL_COMMUNITY): Payer: Self-pay | Admitting: *Deleted

## 2016-03-05 NOTE — Telephone Encounter (Signed)
Patient given detailed instructions per Myocardial Perfusion Study Information Sheet for the test on 03/10/16. Patient notified to arrive 15 minutes early and that it is imperative to arrive on time for appointment to keep from having the test rescheduled.  If you need to cancel or reschedule your appointment, please call the office within 24 hours of your appointment. Failure to do so may result in a cancellation of your appointment, and a $50 no show fee. Patient verbalized understanding.Hubbard Robinson, RN

## 2016-03-10 ENCOUNTER — Other Ambulatory Visit (HOSPITAL_COMMUNITY): Payer: 59

## 2016-03-10 ENCOUNTER — Encounter (HOSPITAL_COMMUNITY): Payer: 59

## 2016-03-10 ENCOUNTER — Telehealth (HOSPITAL_COMMUNITY): Payer: Self-pay | Admitting: *Deleted

## 2016-03-10 ENCOUNTER — Encounter (HOSPITAL_COMMUNITY): Payer: Self-pay | Admitting: *Deleted

## 2016-03-10 NOTE — Telephone Encounter (Signed)
Patient was a no show for nuclear appointment.  Patient called,patient stated he has the flu and his wife called Sat. She spoke with the answering service and informed them he would not be here for appt. Patient stated he would call us back when he was better to reschedule appt. Patient is a preop clearance for 4/17. Hasspacher, Ranae Palms

## 2016-03-11 MED FILL — MELOXICAM 15 MG TABLET: 15 | 90 days supply | Qty: 90 | Fill #1

## 2016-03-29 MED FILL — LEVOTHYROXINE 137 MCG TAB: 137 | 90 days supply | Qty: 90 | Fill #1

## 2016-04-03 ENCOUNTER — Ambulatory Visit: Payer: Self-pay | Admitting: Orthopedic Surgery

## 2016-04-03 NOTE — Progress Notes (Signed)
Preoperative surgical orders have been place into the Epic hospital system for HENOS PARLIN on 04/03/2016, 3:20 PM  by Mickel Crow for surgery on 04-16-16.  Preop Total Hip - Anterior Approach orders including IV Tylenol, and IV Decadron as long as there are no contraindications to the above medications. Arlee Muslim, PA-C

## 2016-04-03 NOTE — Patient Instructions (Signed)
[name]  04/03/2016   Your procedure is scheduled on: April 16, 2016  Report to [facility]  Entrance take [place]  elevators to 3rd floor to  [place] at  8:45 AM.  Call this number if you have problems the morning of surgery [phone]   Remember: ONLY 1 PERSON MAY GO WITH YOU TO SHORT STAY TO GET  READY MORNING OF [place].  Do not eat food or drink liquids :After Midnight.     Take these medicines the morning of surgery with A SIP OF WATER: Amlodipine (Norvasc), Levothroid DO NOT TAKE ANY DIABETIC MEDICATIONS DAY OF YOUR SURGERY                               You may not have any metal on your body including hair pins and              piercings  Do not wear jewelry, lotions, powders or perfumes, deodorant               Men may shave face and neck.   Do not bring valuables to the hospital. [place].  Contacts, dentures or bridgework may not be worn into surgery.  Leave suitcase in the car. After surgery it may be brought to your room.       Special Instructions:coughing and deep breathing exercises, leg exercises              Please read over the following fact sheets you were given: _____________________________________________________________________             [facility] - Preparing for Surgery Before surgery, you can play an important role.  Because skin is not sterile, your skin needs to be as free of germs as possible.  You can reduce the number of germs on your skin by washing with CHG (chlorahexidine gluconate) soap before surgery.  CHG is an antiseptic cleaner which kills germs and bonds with the skin to continue killing germs even after washing. Please DO NOT use if you have an allergy to CHG or antibacterial soaps.  If your skin becomes reddened/irritated stop using the CHG and inform your nurse when you arrive at Short Stay. Do not shave (including legs and underarms)  for at least 48 hours prior to the first CHG shower.  You may shave your face/neck. Please follow these instructions carefully:  1.  Shower with CHG Soap the night before surgery and the  morning of Surgery.  2.  If you choose to wash your hair, wash your hair first as usual with your  normal  shampoo.  3.  After you shampoo, rinse your hair and body thoroughly to remove the  shampoo.                           4.  Use CHG as you would any other liquid soap.  You can apply chg directly  to the skin and wash                       Gently with a scrungie or clean washcloth.  5.  Apply the CHG Soap to your body  ONLY FROM THE NECK DOWN.   Do not use on face/ open                           Wound or open sores. Avoid contact with eyes, ears mouth and genitals (private parts).                       Wash face,  Genitals (private parts) with your normal soap.             6.  Wash thoroughly, paying special attention to the area where your surgery  will be performed.  7.  Thoroughly rinse your body with warm water from the neck down.  8.  DO NOT shower/wash with your normal soap after using and rinsing off  the CHG Soap.                9.  Pat yourself dry with a clean towel.            10.  Wear clean pajamas.            11.  Place clean sheets on your bed the night of your first shower and do not  sleep with pets. Day of Surgery : Do not apply any lotions/deodorants the morning of surgery.  Please wear clean clothes to the hospital/surgery center.  FAILURE TO FOLLOW THESE INSTRUCTIONS MAY RESULT IN THE CANCELLATION OF YOUR SURGERY PATIENT SIGNATURE_________________________________  NURSE SIGNATURE__________________________________  ________________________________________________________________________  WHAT IS A BLOOD TRANSFUSION? Blood Transfusion Information  A transfusion is the replacement of blood or some of its parts. Blood is made up of multiple cells which provide different  functions.  Red blood cells carry oxygen and are used for blood loss replacement.  White blood cells fight against infection.  Platelets control bleeding.  Plasma helps clot blood.  Other blood products are available for specialized needs, such as hemophilia or other clotting disorders. BEFORE THE TRANSFUSION  Who gives blood for transfusions?   Healthy volunteers who are fully evaluated to make sure their blood is safe. This is blood bank blood. Transfusion therapy is the safest it has ever been in the practice of medicine. Before blood is taken from a donor, a complete history is taken to make sure that person has no history of diseases nor engages in risky social behavior (examples are intravenous drug use or sexual activity with multiple partners). The donor's travel history is screened to minimize risk of transmitting infections, such as malaria. The donated blood is tested for signs of infectious diseases, such as HIV and hepatitis. The blood is then tested to be sure it is compatible with you in order to minimize the chance of a transfusion reaction. If you or a relative donates blood, this is often done in anticipation of surgery and is not appropriate for emergency situations. It takes many days to process the donated blood. RISKS AND COMPLICATIONS Although transfusion therapy is very safe and saves many lives, the main dangers of transfusion include:  1. Getting an infectious disease. 2. Developing a transfusion reaction. This is an allergic reaction to something in the blood you were given. Every precaution is taken to prevent this. The decision to have a blood transfusion has been considered carefully by your caregiver before blood is given. Blood is not given unless the benefits outweigh the risks. AFTER THE TRANSFUSION  Right after receiving a blood transfusion, you will usually feel much  better and more energetic. This is especially true if your red blood cells have gotten low  (anemic). The transfusion raises the level of the red blood cells which carry oxygen, and this usually causes an energy increase.  The nurse administering the transfusion will monitor you carefully for complications. HOME CARE INSTRUCTIONS  No special instructions are needed after a transfusion. You may find your energy is better. Speak with your caregiver about any limitations on activity for underlying diseases you may have. SEEK MEDICAL CARE IF:   Your condition is not improving after your transfusion.  You develop redness or irritation at the intravenous (IV) site. SEEK IMMEDIATE MEDICAL CARE IF:  Any of the following symptoms occur over the next 12 hours:  Shaking chills.  You have a temperature by mouth above 102 F (38.9 C), not controlled by medicine.  Chest, back, or muscle pain.  People around you feel you are not acting correctly or are confused.  Shortness of breath or difficulty breathing.  Dizziness and fainting.  You get a rash or develop hives.  You have a decrease in urine output.  Your urine turns a dark color or changes to pink, red, or brown. Any of the following symptoms occur over the next 10 days:  You have a temperature by mouth above 102 F (38.9 C), not controlled by medicine.  Shortness of breath.  Weakness after normal activity.  The white part of the eye turns yellow (jaundice).  You have a decrease in the amount of urine or are urinating less often.  Your urine turns a dark color or changes to pink, red, or brown. Document Released: 12/12/2000 Document Revised: 03/08/2012 Document Reviewed: 07/31/2008 ExitCare Patient Information 2014 [place].  _______________________________________________________________________  Incentive Spirometer  An incentive spirometer is a tool that can help keep your lungs clear and active. This tool measures how well you are filling your lungs with each breath. Taking long deep breaths may help  reverse or decrease the chance of developing breathing (pulmonary) problems (especially infection) following:  A long period of time when you are unable to move or be active. BEFORE THE PROCEDURE   If the spirometer includes an indicator to show your best effort, your nurse or respiratory therapist will set it to a desired goal.  If possible, sit up straight or lean slightly forward. Try not to slouch.  Hold the incentive spirometer in an upright position. INSTRUCTIONS FOR USE  3. Sit on the edge of your bed if possible, or sit up as far as you can in bed or on a chair. 4. Hold the incentive spirometer in an upright position. 5. Breathe out normally. 6. Place the mouthpiece in your mouth and seal your lips tightly around it. 7. Breathe in slowly and as deeply as possible, raising the piston or the ball toward the top of the column. 8. Hold your breath for 3-5 seconds or for as long as possible. Allow the piston or ball to fall to the bottom of the column. 9. Remove the mouthpiece from your mouth and breathe out normally. 10. Rest for a few seconds and repeat Steps 1 through 7 at least 10 times every 1-2 hours when you are awake. Take your time and take a few normal breaths between deep breaths. 11. The spirometer may include an indicator to show your best effort. Use the indicator as a goal to work toward during each repetition. 12. After each set of 10 deep breaths, practice coughing to be sure  your lungs are clear. If you have an incision (the cut made at the time of surgery), support your incision when coughing by placing a pillow or rolled up towels firmly against it. Once you are able to get out of bed, walk around indoors and cough well. You may stop using the incentive spirometer when instructed by your caregiver.  RISKS AND COMPLICATIONS  Take your time so you do not get dizzy or light-headed.  If you are in pain, you may need to take or ask for pain medication before doing  incentive spirometry. It is harder to take a deep breath if you are having pain. AFTER USE  Rest and breathe slowly and easily.  It can be helpful to keep track of a log of your progress. Your caregiver can provide you with a simple table to help with this. If you are using the spirometer at home, follow these instructions: [place] IF:   You are having difficultly using the spirometer.  You have trouble using the spirometer as often as instructed.  Your pain medication is not giving enough relief while using the spirometer.  You develop fever of 100.5 F (38.1 C) or higher. SEEK IMMEDIATE MEDICAL CARE IF:   You cough up bloody sputum that had not been present before.  You develop fever of 102 F (38.9 C) or greater.  You develop worsening pain at or near the incision site. MAKE SURE YOU:   Understand these instructions.  Will watch your condition.  Will get help right away if you are not doing well or get worse. Document Released: 04/27/2007 Document Revised: 03/08/2012 Document Reviewed: 06/28/2007 [facility] Patient Information 2014 [place], [place].   ________________________________________________________________________

## 2016-04-08 ENCOUNTER — Encounter (HOSPITAL_COMMUNITY)
Admission: RE | Admit: 2016-04-08 | Discharge: 2016-04-08 | Disposition: A | Discharge status: Home / Self Care | Payer: 59 | Source: Ambulatory Visit | Attending: Orthopedic Surgery | Admitting: Orthopedic Surgery

## 2016-04-08 ENCOUNTER — Encounter (HOSPITAL_COMMUNITY): Payer: Self-pay

## 2016-04-08 ENCOUNTER — Ambulatory Visit: Payer: Self-pay | Admitting: Orthopedic Surgery

## 2016-04-08 DIAGNOSIS — M1612 Unilateral primary osteoarthritis, left hip: Secondary | ICD-10-CM | POA: Insufficient documentation

## 2016-04-08 DIAGNOSIS — Z01812 Encounter for preprocedural laboratory examination: Secondary | ICD-10-CM | POA: Insufficient documentation

## 2016-04-08 LAB — URINALYSIS, ROUTINE W REFLEX MICROSCOPIC
Bilirubin Urine: NEGATIVE
Glucose, UA: NEGATIVE mg/dL
Hgb urine dipstick: NEGATIVE
Ketones, ur: NEGATIVE mg/dL
Leukocytes, UA: NEGATIVE
Nitrite: NEGATIVE
Protein, ur: NEGATIVE mg/dL
Specific Gravity, Urine: 1.026 (ref 1.005–1.030)
pH: 5.5 (ref 5.0–8.0)

## 2016-04-08 LAB — CBC
HCT: 41.9 % (ref 39.0–52.0)
Hemoglobin: 14.9 g/dL (ref 13.0–17.0)
MCH: 30.4 pg (ref 26.0–34.0)
MCHC: 35.6 g/dL (ref 30.0–36.0)
MCV: 85.5 fL (ref 78.0–100.0)
Platelets: 160 10*3/uL (ref 150–400)
RBC: 4.9 MIL/uL (ref 4.22–5.81)
RDW: 12.5 % (ref 11.5–15.5)
WBC: 5 10*3/uL (ref 4.0–10.5)

## 2016-04-08 LAB — COMPREHENSIVE METABOLIC PANEL
ALT: 23 U/L (ref 17–63)
AST: 22 U/L (ref 15–41)
Albumin: 4.6 g/dL (ref 3.5–5.0)
Alkaline Phosphatase: 76 U/L (ref 38–126)
Anion gap: 8 (ref 5–15)
BUN: 12 mg/dL (ref 6–20)
CO2: 25 mmol/L (ref 22–32)
Calcium: 10 mg/dL (ref 8.9–10.3)
Chloride: 108 mmol/L (ref 101–111)
Creatinine, Ser: 0.77 mg/dL (ref 0.61–1.24)
GFR calc Af Amer: >60 mL/min (ref >60)
GFR calc non Af Amer: >60 mL/min (ref >60)
Glucose, Bld: 102 mg/dL — ABNORMAL HIGH (ref 65–99)
Potassium: 4 mmol/L (ref 3.5–5.1)
Sodium: 141 mmol/L (ref 135–145)
Total Bilirubin: 0.5 mg/dL (ref 0.3–1.2)
Total Protein: 7.6 g/dL (ref 6.5–8.1)

## 2016-04-08 LAB — APTT: aPTT: 29 seconds (ref 24–37)

## 2016-04-08 LAB — PROTIME-INR
INR: 1.01 (ref 0.00–1.49)
Prothrombin Time: 13.5 seconds (ref 11.6–15.2)

## 2016-04-08 LAB — SURGICAL PCR SCREEN
MRSA, PCR: NEGATIVE
Staphylococcus aureus: NEGATIVE

## 2016-04-08 NOTE — Progress Notes (Addendum)
02-22-16 - EKG - EPIC 02-22-16 - Dr. Jenny Reichmann (int.med.) - EPIC

## 2016-04-08 NOTE — H&P (Signed)
[name] DOB: September 28, 1951 Married / Language: English / Race: White Male Date of Admission:  04/16/2016 CC:  Left Hip Pain History of Present Illness  The patient is a 65 year old male who comes in for a preoperative History and Physical. The patient is scheduled for a left total hip arthroplasty (anterior) to be performed by Dr. Dione Plover. Aluisio, MD at [facility] on 04-16-2016. The patient is a 65 year old male who presents today for follow up of their hip. The patient is being followed for their left hip pain and osteoarthritis. They are 4 week(s) out from intra-articular injection. Symptoms reported today include: pain and aching. The patient feels that they are doing well and report their pain level to be mild to moderate (increases with activity). The following medication has been used for pain control: none. The patient has reported improvement of their symptoms with: Cortisone injections. The cortisone provided some benefit, but he is still having a lot of pain and dysfunction. He is ready to proceed with surgery. They have been treated conservatively in the past for the above stated problem and despite conservative measures, they continue to have progressive pain and severe functional limitations and dysfunction. They have failed non-operative management including home exercise, medications, and injections. It is felt that they would benefit from undergoing total joint replacement. Risks and benefits of the procedure have been discussed with the patient and they elect to proceed with surgery. There are no active contraindications to surgery such as ongoing infection or rapidly progressive neurological disease.  Problem List/Past Medical  S/P total knee replacement ([id number])  Complication of joint prosthesis (T84.9XXA)  Status post right hip replacement [id number])  Primary osteoarthritis of left hip (M16.12)  Shingles  Hypothyroidism  Urinary Frequency  Hypertension   Hemorrhoids  Enlarged prostate  Kidney Stone  Measles  Mumps  Allergies  No Known Drug Allergies   Family History Hypertension  father Osteoarthritis  father Heart Disease  father Cancer  father and sister Diabetes Mellitus  father  Social History  Number of flights of stairs before winded  greater than 5 Marital status  married Living situation  live with spouse Tobacco use  Never smoker. never smoker Tobacco / smoke exposure  no Pain Contract  no Illicit drug use  no Current work status  working part time Children  2 Alcohol use  current drinker; drinks beer; less than 5 per week Exercise  Exercises weekly; does other Drug/Alcohol Rehab (Previously)  no Drug/Alcohol Rehab (Currently)  no  Medication History  Meloxicam (15MG  Tablet, Oral) Active. Levothyroxine Sodium (125MCG Tablet, Oral) Active. AmLODIPine Besylate (5MG  Tablet, Oral) Active. Alfuzosin HCl ER (10MG  Tablet ER 24HR, Oral) Active. Aspirin EC (81MG  Tablet DR, Oral) Active. Multiple Vitamin (Oral) Specific strength unknown - Active. Fish Oil Burp-Less (1000MG  Capsule, Oral) Active.  Past Surgical History Arthroscopy of Knee  bilateral Spinal Surgery  Straighten Nasal Septum  Tonsillectomy  Total Hip Replacement  right   Review of Systems General Not Present- Chills, Fatigue, Fever, Memory Loss, Night Sweats, Weight Gain and Weight Loss. Skin Not Present- Eczema, Hives, Itching, Lesions and Rash. HEENT Not Present- Dentures, Double Vision, Headache, Hearing Loss, Tinnitus and Visual Loss. Respiratory Not Present- Allergies, Chronic Cough, Coughing up blood, Shortness of breath at rest and Shortness of breath with exertion. Cardiovascular Not Present- Chest Pain, Difficulty Breathing Lying Down, Murmur, Palpitations, Racing/skipping heartbeats and Swelling. Gastrointestinal Present- Constipation. Not Present- Abdominal Pain, Bloody Stool, Diarrhea, Difficulty  Swallowing, Heartburn,  Jaundice, Loss of appetitie, Nausea and Vomiting. Male Genitourinary Present- Urinary frequency, Urinating at Night and Weak urinary stream. Not Present- Blood in Urine, Discharge, Flank Pain, Incontinence, Painful Urination, Urgency and Urinary Retention. Musculoskeletal Present- Back Pain, Joint Pain and Morning Stiffness. Not Present- Joint Swelling, Muscle Pain, Muscle Weakness and Spasms. Neurological Present- Dizziness. Not Present- Blackout spells, Difficulty with balance, Paralysis, Tremor and Weakness. Psychiatric Not Present- Insomnia.  Vitals  Weight: 269 lb Height: 72in Body Surface Area: 2.42 m Body Mass Index: 36.48 kg/m  Pulse: 76 (Regular)  BP: 142/88 (Sitting, Right Arm, Standard)   Physical Exam General Mental Status -Alert, cooperative and good historian. General Appearance-pleasant, Not in acute distress. Orientation-Oriented X3. Build & Nutrition-Well nourished and Well developed.  Head and Neck Head-normocephalic, atraumatic . Neck Global Assessment - supple, no bruit auscultated on the right, no bruit auscultated on the left.  Eye Vision-Wears contact lenses. Pupil - Right-Regular and Round. Motion - Bilateral-EOMI.  Chest and Lung Exam Auscultation Breath sounds - clear at anterior chest wall and clear at posterior chest wall. Adventitious sounds - No Adventitious sounds.  Cardiovascular Auscultation Rhythm - Regular rate and rhythm. Heart Sounds - S1 WNL and S2 WNL. Murmurs & Other Heart Sounds - Auscultation of the heart reveals - No Murmurs.  Abdomen Inspection Contour - Generalized mild distention. Palpation/Percussion Tenderness - Abdomen is non-tender to palpation. Rigidity (guarding) - Abdomen is soft. Auscultation Auscultation of the abdomen reveals - Bowel sounds normal.  Male Genitourinary Note: Not done, not pertinent to present illness   Musculoskeletal Note: On exam, he is alert  and oriented, in no apparent distress. His left hip could be flexed to 100. No internal rotation, about 20 external rotation, 20 abduction. He walks with a limp.  Assessment & Plan Primary osteoarthritis of left hip (M16.12) Status post right hip replacement [id number])  Note:Surgical Plans: Left Total Hip Replacement - Anterior Approach  Disposition: Home  PCP: Dr. Asa Lente  IV TXA  Anesthesia Issues: None  Signed electronically by Ok Edwards, III PA-C

## 2016-04-15 MED ORDER — DEXTROSE 5 % IV SOLN
3.0000 g | INTRAVENOUS | Status: AC
  Administered 2016-04-16: 3 g via INTRAVENOUS
  Filled 2016-04-15: qty 3000

## 2016-04-16 ENCOUNTER — Inpatient Hospital Stay (HOSPITAL_COMMUNITY): Payer: 59

## 2016-04-16 ENCOUNTER — Encounter (HOSPITAL_COMMUNITY)
Admission: RE | Disposition: A | Discharge status: Home Health | Payer: Self-pay | Source: Ambulatory Visit | Attending: Orthopedic Surgery

## 2016-04-16 ENCOUNTER — Inpatient Hospital Stay (HOSPITAL_COMMUNITY)
Admission: RE | Admit: 2016-04-16 | Discharge: 2016-04-17 | DRG: 470 | Disposition: A | Discharge status: Home Health | Payer: 59 | Source: Ambulatory Visit | Attending: Orthopedic Surgery | Admitting: Orthopedic Surgery

## 2016-04-16 ENCOUNTER — Inpatient Hospital Stay (HOSPITAL_COMMUNITY): Payer: 59 | Admitting: Anesthesiology

## 2016-04-16 ENCOUNTER — Encounter (HOSPITAL_COMMUNITY): Payer: Self-pay | Admitting: *Deleted

## 2016-04-16 DIAGNOSIS — Z96641 Presence of right artificial hip joint: Secondary | ICD-10-CM | POA: Diagnosis present

## 2016-04-16 DIAGNOSIS — Z7982 Long term (current) use of aspirin: Secondary | ICD-10-CM

## 2016-04-16 DIAGNOSIS — M1612 Unilateral primary osteoarthritis, left hip: Principal | ICD-10-CM | POA: Diagnosis present

## 2016-04-16 DIAGNOSIS — Z79899 Other long term (current) drug therapy: Secondary | ICD-10-CM

## 2016-04-16 DIAGNOSIS — E669 Obesity, unspecified: Secondary | ICD-10-CM | POA: Diagnosis present

## 2016-04-16 DIAGNOSIS — Z6837 Body mass index (BMI) 37.0-37.9, adult: Secondary | ICD-10-CM | POA: Diagnosis not present

## 2016-04-16 DIAGNOSIS — M25552 Pain in left hip: Secondary | ICD-10-CM | POA: Diagnosis present

## 2016-04-16 DIAGNOSIS — M169 Osteoarthritis of hip, unspecified: Secondary | ICD-10-CM | POA: Diagnosis present

## 2016-04-16 DIAGNOSIS — Z96649 Presence of unspecified artificial hip joint: Secondary | ICD-10-CM

## 2016-04-16 DIAGNOSIS — E039 Hypothyroidism, unspecified: Secondary | ICD-10-CM | POA: Diagnosis present

## 2016-04-16 DIAGNOSIS — Z01812 Encounter for preprocedural laboratory examination: Secondary | ICD-10-CM | POA: Diagnosis not present

## 2016-04-16 DIAGNOSIS — I10 Essential (primary) hypertension: Secondary | ICD-10-CM | POA: Diagnosis present

## 2016-04-16 DIAGNOSIS — Z471 Aftercare following joint replacement surgery: Secondary | ICD-10-CM | POA: Diagnosis not present

## 2016-04-16 DIAGNOSIS — Z96642 Presence of left artificial hip joint: Secondary | ICD-10-CM | POA: Diagnosis not present

## 2016-04-16 HISTORY — PX: TOTAL HIP ARTHROPLASTY: SHX124

## 2016-04-16 LAB — TYPE AND SCREEN
ABO/RH(D): O POS
Antibody Screen: NEGATIVE

## 2016-04-16 SURGERY — ARTHROPLASTY, HIP, TOTAL, ANTERIOR APPROACH
Anesthesia: Spinal | Site: Hip | Laterality: Left

## 2016-04-16 MED ORDER — AMLODIPINE BESYLATE 5 MG PO TABS
5.0000 mg | ORAL_TABLET | Freq: Every day | ORAL | Status: DC
  Administered 2016-04-17: 5 mg via ORAL
  Filled 2016-04-16: qty 1

## 2016-04-16 MED ORDER — ACETAMINOPHEN 500 MG PO TABS
1000.0000 mg | ORAL_TABLET | Freq: Four times a day (QID) | ORAL | Status: AC
  Administered 2016-04-16 – 2016-04-17 (×4): 1000 mg via ORAL
  Filled 2016-04-16 (×5): qty 2

## 2016-04-16 MED ORDER — ZOLPIDEM TARTRATE 10 MG PO TABS
10.0000 mg | ORAL_TABLET | Freq: Every evening | ORAL | Status: DC | PRN
  Administered 2016-04-16: 10 mg via ORAL
  Filled 2016-04-16: qty 1

## 2016-04-16 MED ORDER — FENTANYL CITRATE (PF) 100 MCG/2ML IJ SOLN
INTRAMUSCULAR | Status: AC
  Filled 2016-04-16: qty 2

## 2016-04-16 MED ORDER — LACTATED RINGERS IV SOLN
INTRAVENOUS | Status: DC
Start: 2016-04-16 — End: 2016-04-16
  Administered 2016-04-16: 1000 mL via INTRAVENOUS
  Administered 2016-04-16: 12:00:00 via INTRAVENOUS

## 2016-04-16 MED ORDER — TRAMADOL HCL 50 MG PO TABS: 50.0000 mg | ORAL_TABLET | Freq: Four times a day (QID) | ORAL | Status: DC | PRN

## 2016-04-16 MED ORDER — FLEET ENEMA 7-19 GM/118ML RE ENEM: 1.0000 | ENEMA | Freq: Once | RECTAL | Status: DC | PRN

## 2016-04-16 MED ORDER — POLYETHYLENE GLYCOL 3350 17 G PO PACK: 17.0000 g | PACK | Freq: Every day | ORAL | Status: DC | PRN

## 2016-04-16 MED ORDER — SODIUM CHLORIDE 0.9 % IV SOLN
INTRAVENOUS | Status: DC
  Administered 2016-04-16: 16:00:00 via INTRAVENOUS

## 2016-04-16 MED ORDER — LEVOTHYROXINE SODIUM 137 MCG PO TABS
137.0000 ug | ORAL_TABLET | Freq: Every day | ORAL | Status: DC
  Administered 2016-04-17: 137 ug via ORAL
  Filled 2016-04-16 (×2): qty 1

## 2016-04-16 MED ORDER — PHENOL 1.4 % MT LIQD: 1.0000 | OROMUCOSAL | Status: DC | PRN

## 2016-04-16 MED ORDER — SODIUM CHLORIDE 0.9 % IV SOLN: INTRAVENOUS | Status: DC

## 2016-04-16 MED ORDER — BUPIVACAINE HCL (PF) 0.25 % IJ SOLN
INTRAMUSCULAR | Status: DC | PRN
  Administered 2016-04-16: 30 mL

## 2016-04-16 MED ORDER — METOCLOPRAMIDE HCL 10 MG PO TABS: 5.0000 mg | ORAL_TABLET | Freq: Three times a day (TID) | ORAL | Status: DC | PRN

## 2016-04-16 MED ORDER — MORPHINE SULFATE (PF) 2 MG/ML IV SOLN: 1.0000 mg | INTRAVENOUS | Status: DC | PRN

## 2016-04-16 MED ORDER — LIDOCAINE HCL (CARDIAC) 20 MG/ML IV SOLN
INTRAVENOUS | Status: AC
  Filled 2016-04-16: qty 5

## 2016-04-16 MED ORDER — PROPOFOL 10 MG/ML IV BOLUS
INTRAVENOUS | Status: AC
  Filled 2016-04-16: qty 20

## 2016-04-16 MED ORDER — ACETAMINOPHEN 325 MG PO TABS: 650.0000 mg | ORAL_TABLET | Freq: Four times a day (QID) | ORAL | Status: DC | PRN

## 2016-04-16 MED ORDER — ONDANSETRON HCL 4 MG/2ML IJ SOLN
INTRAMUSCULAR | Status: AC
  Filled 2016-04-16: qty 2

## 2016-04-16 MED ORDER — ALFUZOSIN HCL ER 10 MG PO TB24
10.0000 mg | ORAL_TABLET | Freq: Every evening | ORAL | Status: DC
  Administered 2016-04-16: 10 mg via ORAL
  Filled 2016-04-16 (×2): qty 1

## 2016-04-16 MED ORDER — OXYCODONE HCL 5 MG PO TABS
5.0000 mg | ORAL_TABLET | ORAL | Status: DC | PRN
  Administered 2016-04-16 – 2016-04-17 (×3): 10 mg via ORAL
  Filled 2016-04-16: qty 1
  Filled 2016-04-16 (×3): qty 2

## 2016-04-16 MED ORDER — ONDANSETRON HCL 4 MG/2ML IJ SOLN: 4.0000 mg | Freq: Four times a day (QID) | INTRAMUSCULAR | Status: DC | PRN

## 2016-04-16 MED ORDER — MENTHOL 3 MG MT LOZG: 1.0000 | LOZENGE | OROMUCOSAL | Status: DC | PRN

## 2016-04-16 MED ORDER — PROPOFOL 10 MG/ML IV BOLUS
INTRAVENOUS | Status: AC
  Filled 2016-04-16: qty 40

## 2016-04-16 MED ORDER — MECLIZINE HCL 12.5 MG PO TABS: 12.5000 mg | ORAL_TABLET | Freq: Three times a day (TID) | ORAL | Status: DC | PRN

## 2016-04-16 MED ORDER — CHLORHEXIDINE GLUCONATE 4 % EX LIQD: 60.0000 mL | Freq: Once | CUTANEOUS | Status: DC

## 2016-04-16 MED ORDER — ACETAMINOPHEN 10 MG/ML IV SOLN
INTRAVENOUS | Status: AC
  Filled 2016-04-16: qty 100

## 2016-04-16 MED ORDER — FENTANYL CITRATE (PF) 100 MCG/2ML IJ SOLN
INTRAMUSCULAR | Status: DC | PRN
  Administered 2016-04-16: 100 ug via INTRAVENOUS

## 2016-04-16 MED ORDER — TRANEXAMIC ACID 1000 MG/10ML IV SOLN
1000.0000 mg | INTRAVENOUS | Status: AC
  Administered 2016-04-16: 1000 mg via INTRAVENOUS
  Filled 2016-04-16: qty 10

## 2016-04-16 MED ORDER — HYDROMORPHONE HCL 1 MG/ML IJ SOLN: 0.2500 mg | INTRAMUSCULAR | Status: DC | PRN

## 2016-04-16 MED ORDER — PROPOFOL 500 MG/50ML IV EMUL
INTRAVENOUS | Status: DC | PRN
  Administered 2016-04-16: 125 ug/kg/min via INTRAVENOUS

## 2016-04-16 MED ORDER — DIPHENHYDRAMINE HCL 12.5 MG/5ML PO ELIX: 12.5000 mg | ORAL_SOLUTION | ORAL | Status: DC | PRN

## 2016-04-16 MED ORDER — METHOCARBAMOL 1000 MG/10ML IJ SOLN
500.0000 mg | Freq: Four times a day (QID) | INTRAVENOUS | Status: DC | PRN
  Administered 2016-04-16: 500 mg via INTRAVENOUS
  Filled 2016-04-16 (×2): qty 5

## 2016-04-16 MED ORDER — DEXAMETHASONE SODIUM PHOSPHATE 10 MG/ML IJ SOLN
10.0000 mg | Freq: Once | INTRAMUSCULAR | Status: AC
  Administered 2016-04-16: 10 mg via INTRAVENOUS

## 2016-04-16 MED ORDER — ACETAMINOPHEN 650 MG RE SUPP
650.0000 mg | Freq: Four times a day (QID) | RECTAL | Status: DC | PRN
Start: 2016-04-17 — End: 2016-04-17

## 2016-04-16 MED ORDER — METOCLOPRAMIDE HCL 5 MG/ML IJ SOLN: 5.0000 mg | Freq: Three times a day (TID) | INTRAMUSCULAR | Status: DC | PRN

## 2016-04-16 MED ORDER — ONDANSETRON HCL 4 MG/2ML IJ SOLN
INTRAMUSCULAR | Status: DC | PRN
  Administered 2016-04-16: 4 mg via INTRAVENOUS

## 2016-04-16 MED ORDER — SODIUM CHLORIDE 0.9 % IV SOLN
1000.0000 mg | Freq: Once | INTRAVENOUS | Status: AC
  Administered 2016-04-16: 1000 mg via INTRAVENOUS
  Filled 2016-04-16: qty 10

## 2016-04-16 MED ORDER — RIVAROXABAN 10 MG PO TABS
10.0000 mg | ORAL_TABLET | Freq: Every day | ORAL | Status: DC
  Administered 2016-04-17: 10 mg via ORAL
  Filled 2016-04-16 (×2): qty 1

## 2016-04-16 MED ORDER — CEFAZOLIN SODIUM-DEXTROSE 2-4 GM/100ML-% IV SOLN
2.0000 g | Freq: Four times a day (QID) | INTRAVENOUS | Status: AC
  Administered 2016-04-16 (×2): 2 g via INTRAVENOUS
  Filled 2016-04-16 (×2): qty 100

## 2016-04-16 MED ORDER — BISACODYL 10 MG RE SUPP: 10.0000 mg | Freq: Every day | RECTAL | Status: DC | PRN

## 2016-04-16 MED ORDER — ONDANSETRON HCL 4 MG PO TABS: 4.0000 mg | ORAL_TABLET | Freq: Four times a day (QID) | ORAL | Status: DC | PRN

## 2016-04-16 MED ORDER — BUPIVACAINE IN DEXTROSE 0.75-8.25 % IT SOLN
INTRATHECAL | Status: DC | PRN
  Administered 2016-04-16: 2 mL via INTRATHECAL

## 2016-04-16 MED ORDER — MIDAZOLAM HCL 5 MG/5ML IJ SOLN
INTRAMUSCULAR | Status: DC | PRN
  Administered 2016-04-16: 2 mg via INTRAVENOUS

## 2016-04-16 MED ORDER — DOCUSATE SODIUM 100 MG PO CAPS
100.0000 mg | ORAL_CAPSULE | Freq: Two times a day (BID) | ORAL | Status: DC
  Administered 2016-04-16 – 2016-04-17 (×2): 100 mg via ORAL

## 2016-04-16 MED ORDER — ACETAMINOPHEN 10 MG/ML IV SOLN
1000.0000 mg | Freq: Once | INTRAVENOUS | Status: AC
  Administered 2016-04-16: 1000 mg via INTRAVENOUS

## 2016-04-16 MED ORDER — METHOCARBAMOL 500 MG PO TABS
500.0000 mg | ORAL_TABLET | Freq: Four times a day (QID) | ORAL | Status: DC | PRN
  Administered 2016-04-17: 500 mg via ORAL
  Filled 2016-04-16 (×2): qty 1

## 2016-04-16 MED ORDER — EPHEDRINE SULFATE 50 MG/ML IJ SOLN
INTRAMUSCULAR | Status: AC
  Filled 2016-04-16: qty 1

## 2016-04-16 MED ORDER — LACTATED RINGERS IV SOLN: INTRAVENOUS | Status: DC

## 2016-04-16 MED ORDER — BUPIVACAINE HCL (PF) 0.25 % IJ SOLN
INTRAMUSCULAR | Status: AC
  Filled 2016-04-16: qty 30

## 2016-04-16 MED ORDER — SODIUM CHLORIDE 0.9 % IJ SOLN
INTRAMUSCULAR | Status: AC
  Filled 2016-04-16: qty 10

## 2016-04-16 MED ORDER — DEXAMETHASONE SODIUM PHOSPHATE 10 MG/ML IJ SOLN
INTRAMUSCULAR | Status: AC
  Filled 2016-04-16: qty 1

## 2016-04-16 MED ORDER — DEXAMETHASONE SODIUM PHOSPHATE 10 MG/ML IJ SOLN
10.0000 mg | Freq: Once | INTRAMUSCULAR | Status: AC
  Administered 2016-04-17: 10 mg via INTRAVENOUS
  Filled 2016-04-16: qty 1

## 2016-04-16 SURGICAL SUPPLY — 33 items
BAG DECANTER FOR FLEXI CONT (MISCELLANEOUS) ×2 IMPLANT
BAG ZIPLOCK 12X15 (MISCELLANEOUS) IMPLANT
BLADE SAG 18X100X1.27 (BLADE) ×2 IMPLANT
CAPT HIP TOTAL 2 ×2 IMPLANT
CLOTH BEACON ORANGE TIMEOUT ST (SAFETY) ×2 IMPLANT
COVER PERINEAL POST (MISCELLANEOUS) ×2 IMPLANT
DECANTER SPIKE VIAL GLASS SM (MISCELLANEOUS) ×2 IMPLANT
DRAPE STERI IOBAN 125X83 (DRAPES) ×2 IMPLANT
DRAPE U-SHAPE 47X51 STRL (DRAPES) ×4 IMPLANT
DRSG ADAPTIC 3X8 NADH LF (GAUZE/BANDAGES/DRESSINGS) ×2 IMPLANT
DRSG MEPILEX BORDER 4X4 (GAUZE/BANDAGES/DRESSINGS) ×2 IMPLANT
DRSG MEPILEX BORDER 4X8 (GAUZE/BANDAGES/DRESSINGS) ×2 IMPLANT
DURAPREP 26ML APPLICATOR (WOUND CARE) ×2 IMPLANT
ELECT REM PT RETURN 9FT ADLT (ELECTROSURGICAL) ×2
ELECTRODE REM PT RTRN 9FT ADLT (ELECTROSURGICAL) ×1 IMPLANT
EVACUATOR 1/8 PVC DRAIN (DRAIN) ×2 IMPLANT
GLOVE BIO SURGEON STRL SZ7.5 (GLOVE) ×2 IMPLANT
GLOVE BIO SURGEON STRL SZ8 (GLOVE) ×4 IMPLANT
GLOVE BIOGEL PI IND STRL 8 (GLOVE) ×2 IMPLANT
GLOVE BIOGEL PI INDICATOR 8 (GLOVE) ×2
GOWN STRL REUS W/TWL LRG LVL3 (GOWN DISPOSABLE) ×2 IMPLANT
GOWN STRL REUS W/TWL XL LVL3 (GOWN DISPOSABLE) ×2 IMPLANT
PACK ANTERIOR HIP CUSTOM (KITS) ×2 IMPLANT
STRIP CLOSURE SKIN 1/2X4 (GAUZE/BANDAGES/DRESSINGS) ×2 IMPLANT
SUT ETHIBOND NAB CT1 #1 30IN (SUTURE) ×2 IMPLANT
SUT MNCRL AB 4-0 PS2 18 (SUTURE) ×2 IMPLANT
SUT VIC AB 2-0 CT1 27 (SUTURE) ×2
SUT VIC AB 2-0 CT1 TAPERPNT 27 (SUTURE) ×2 IMPLANT
SUT VLOC 180 0 24IN GS25 (SUTURE) ×2 IMPLANT
SYR 50ML LL SCALE MARK (SYRINGE) IMPLANT
TRAY FOLEY W/METER SILVER 14FR (SET/KITS/TRAYS/PACK) IMPLANT
TRAY FOLEY W/METER SILVER 16FR (SET/KITS/TRAYS/PACK) ×2 IMPLANT
YANKAUER SUCT BULB TIP 10FT TU (MISCELLANEOUS) ×2 IMPLANT

## 2016-04-16 NOTE — H&P (View-Only) (Signed)
[name] DOB: 1951-03-17 Married / Language: English / Race: White Male Date of Admission:  04/16/2016 CC:  Left Hip Pain History of Present Illness  The patient is a 65 year old male who comes in for a preoperative History and Physical. The patient is scheduled for a left total hip arthroplasty (anterior) to be performed by Dr. Dione Plover. Aluisio, MD at [facility] on 04-16-2016. The patient is a 65 year old male who presents today for follow up of their hip. The patient is being followed for their left hip pain and osteoarthritis. They are 4 week(s) out from intra-articular injection. Symptoms reported today include: pain and aching. The patient feels that they are doing well and report their pain level to be mild to moderate (increases with activity). The following medication has been used for pain control: none. The patient has reported improvement of their symptoms with: Cortisone injections. The cortisone provided some benefit, but he is still having a lot of pain and dysfunction. He is ready to proceed with surgery. They have been treated conservatively in the past for the above stated problem and despite conservative measures, they continue to have progressive pain and severe functional limitations and dysfunction. They have failed non-operative management including home exercise, medications, and injections. It is felt that they would benefit from undergoing total joint replacement. Risks and benefits of the procedure have been discussed with the patient and they elect to proceed with surgery. There are no active contraindications to surgery such as ongoing infection or rapidly progressive neurological disease.  Problem List/Past Medical  S/P total knee replacement ([id number])  Complication of joint prosthesis (T84.9XXA)  Status post right hip replacement [id number])  Primary osteoarthritis of left hip (M16.12)  Shingles  Hypothyroidism  Urinary Frequency  Hypertension   Hemorrhoids  Enlarged prostate  Kidney Stone  Measles  Mumps  Allergies  No Known Drug Allergies   Family History Hypertension  father Osteoarthritis  father Heart Disease  father Cancer  father and sister Diabetes Mellitus  father  Social History  Number of flights of stairs before winded  greater than 5 Marital status  married Living situation  live with spouse Tobacco use  Never smoker. never smoker Tobacco / smoke exposure  no Pain Contract  no Illicit drug use  no Current work status  working part time Children  2 Alcohol use  current drinker; drinks beer; less than 5 per week Exercise  Exercises weekly; does other Drug/Alcohol Rehab (Previously)  no Drug/Alcohol Rehab (Currently)  no  Medication History  Meloxicam (15MG  Tablet, Oral) Active. Levothyroxine Sodium (125MCG Tablet, Oral) Active. AmLODIPine Besylate (5MG  Tablet, Oral) Active. Alfuzosin HCl ER (10MG  Tablet ER 24HR, Oral) Active. Aspirin EC (81MG  Tablet DR, Oral) Active. Multiple Vitamin (Oral) Specific strength unknown - Active. Fish Oil Burp-Less (1000MG  Capsule, Oral) Active.  Past Surgical History Arthroscopy of Knee  bilateral Spinal Surgery  Straighten Nasal Septum  Tonsillectomy  Total Hip Replacement  right   Review of Systems General Not Present- Chills, Fatigue, Fever, Memory Loss, Night Sweats, Weight Gain and Weight Loss. Skin Not Present- Eczema, Hives, Itching, Lesions and Rash. HEENT Not Present- Dentures, Double Vision, Headache, Hearing Loss, Tinnitus and Visual Loss. Respiratory Not Present- Allergies, Chronic Cough, Coughing up blood, Shortness of breath at rest and Shortness of breath with exertion. Cardiovascular Not Present- Chest Pain, Difficulty Breathing Lying Down, Murmur, Palpitations, Racing/skipping heartbeats and Swelling. Gastrointestinal Present- Constipation. Not Present- Abdominal Pain, Bloody Stool, Diarrhea, Difficulty  Swallowing, Heartburn,  Jaundice, Loss of appetitie, Nausea and Vomiting. Male Genitourinary Present- Urinary frequency, Urinating at Night and Weak urinary stream. Not Present- Blood in Urine, Discharge, Flank Pain, Incontinence, Painful Urination, Urgency and Urinary Retention. Musculoskeletal Present- Back Pain, Joint Pain and Morning Stiffness. Not Present- Joint Swelling, Muscle Pain, Muscle Weakness and Spasms. Neurological Present- Dizziness. Not Present- Blackout spells, Difficulty with balance, Paralysis, Tremor and Weakness. Psychiatric Not Present- Insomnia.  Vitals  Weight: 269 lb Height: 72in Body Surface Area: 2.42 m Body Mass Index: 36.48 kg/m  Pulse: 76 (Regular)  BP: 142/88 (Sitting, Right Arm, Standard)   Physical Exam General Mental Status -Alert, cooperative and good historian. General Appearance-pleasant, Not in acute distress. Orientation-Oriented X3. Build & Nutrition-Well nourished and Well developed.  Head and Neck Head-normocephalic, atraumatic . Neck Global Assessment - supple, no bruit auscultated on the right, no bruit auscultated on the left.  Eye Vision-Wears contact lenses. Pupil - Right-Regular and Round. Motion - Bilateral-EOMI.  Chest and Lung Exam Auscultation Breath sounds - clear at anterior chest wall and clear at posterior chest wall. Adventitious sounds - No Adventitious sounds.  Cardiovascular Auscultation Rhythm - Regular rate and rhythm. Heart Sounds - S1 WNL and S2 WNL. Murmurs & Other Heart Sounds - Auscultation of the heart reveals - No Murmurs.  Abdomen Inspection Contour - Generalized mild distention. Palpation/Percussion Tenderness - Abdomen is non-tender to palpation. Rigidity (guarding) - Abdomen is soft. Auscultation Auscultation of the abdomen reveals - Bowel sounds normal.  Male Genitourinary Note: Not done, not pertinent to present illness   Musculoskeletal Note: On exam, he is alert  and oriented, in no apparent distress. His left hip could be flexed to 100. No internal rotation, about 20 external rotation, 20 abduction. He walks with a limp.  Assessment & Plan Primary osteoarthritis of left hip (M16.12) Status post right hip replacement [id number])  Note:Surgical Plans: Left Total Hip Replacement - Anterior Approach  Disposition: Home  PCP: Dr. Asa Lente  IV TXA  Anesthesia Issues: None  Signed electronically by Ok Edwards, III PA-C

## 2016-04-16 NOTE — Anesthesia Preprocedure Evaluation (Addendum)
Anesthesia Evaluation  Patient identified by MRN, date of birth, ID band Patient awake    Reviewed: Allergy & Precautions, H&P , NPO status , Patient's Chart, lab work & pertinent test results  Airway Mallampati: II  TM Distance: >3 FB Neck ROM: full    Dental no notable dental hx. (+) Dental Advisory Given, Teeth Intact   Pulmonary neg pulmonary ROS,    Pulmonary exam normal breath sounds clear to auscultation       Cardiovascular Exercise Tolerance: Good hypertension, Pt. on medications Normal cardiovascular exam Rhythm:regular Rate:Normal     Neuro/Psych vertigo negative neurological ROS  negative psych ROS   GI/Hepatic negative GI ROS, Neg liver ROS,   Endo/Other  negative endocrine ROSHypothyroidism   Renal/GU negative Renal ROS  negative genitourinary   Musculoskeletal   Abdominal   Peds  Hematology negative hematology ROS (+)   Anesthesia Other Findings   Reproductive/Obstetrics negative OB ROS                             Anesthesia Physical Anesthesia Plan  ASA: II  Anesthesia Plan: Spinal   Post-op Pain Management:    Induction:   Airway Management Planned:   Additional Equipment:   Intra-op Plan:   Post-operative Plan:   Informed Consent: I have reviewed the patients History and Physical, chart, labs and discussed the procedure including the risks, benefits and alternatives for the proposed anesthesia with the patient or authorized representative who has indicated his/her understanding and acceptance.   Dental Advisory Given  Plan Discussed with: CRNA and Surgeon  Anesthesia Plan Comments:         Anesthesia Quick Evaluation

## 2016-04-16 NOTE — Evaluation (Signed)
Physical Therapy Evaluation Patient Details Name: [name] MRN: [id number] DOB: 1951-07-18 Today's Date: 04/16/2016   History of Present Illness  65 yo male s/p L THA 04/16/16  Clinical Impression  On eval POD 0, pt required Min assist for mobility. He walked ~65 feet with RW. Pain rated 3/10. Anticipate pt will progress well during hospital stay.     Follow Up Recommendations Home health PT    Equipment Recommendations  None recommended by PT (pt stated he will borrow walker if needed)    Recommendations for Other Services       Precautions / Restrictions Precautions Precautions: Fall Restrictions Weight Bearing Restrictions: No LLE Weight Bearing: Weight bearing as tolerated      Mobility  Bed Mobility Overal bed mobility: Needs Assistance Bed Mobility: Supine to Sit     Supine to sit: Min assist;HOB elevated     General bed mobility comments: Assist for L LE.   Transfers Overall transfer level: Needs assistance Equipment used: Rolling walker (2 wheeled) Transfers: Sit to/from Stand Sit to Stand: Min assist         General transfer comment: VCs safety, hand placement. Small amount of assist to steady.   Ambulation/Gait Ambulation/Gait assistance: Min guard Ambulation Distance (Feet): 65 Feet Assistive device: Rolling walker (2 wheeled) Gait Pattern/deviations: Step-to pattern     General Gait Details: VCs safety, sequence. close guard for safety  Stairs            Wheelchair Mobility    Modified Rankin (Stroke Patients Only)       Balance                                             Pertinent Vitals/Pain Pain Assessment: 0-10 Pain Score: 3  Pain Location: L hip/thigh Pain Descriptors / Indicators: Sore Pain Intervention(s): Monitored during session;Repositioned    Home Living Family/patient expects to be discharged to:: Private residence Living Arrangements: Spouse/significant other   Type of Home:  House Home Access: Stairs to enter Entrance Stairs-Rails: Right Entrance Stairs-Number of Steps: 2 Home Layout: Two level;Bed/bath upstairs Home Equipment: Cane - single point;Crutches Additional Comments: can borrow walker if needed.     Prior Function Level of Independence: Independent               Hand Dominance        Extremity/Trunk Assessment   Upper Extremity Assessment: Defer to OT evaluation           Lower Extremity Assessment: Generalized weakness;LLE deficits/detail   LLE Deficits / Details: L hip IR noted  Cervical / Trunk Assessment: Normal  Communication   Communication: No difficulties  Cognition Arousal/Alertness: Awake/alert Behavior During Therapy: WFL for tasks assessed/performed Overall Cognitive Status: Within Functional Limits for tasks assessed                      General Comments      Exercises        Assessment/Plan    PT Assessment Patient needs continued PT services  PT Diagnosis Difficulty walking;Acute pain   PT Problem List Decreased strength;Decreased range of motion;Decreased activity tolerance;Decreased balance;Decreased mobility;Decreased knowledge of use of DME;Pain  PT Treatment Interventions DME instruction;Gait training;Stair training;Functional mobility training;Therapeutic activities;Patient/family education;Balance training;Therapeutic exercise   PT Goals (Current goals can be found in the Care Plan section) Acute Rehab PT Goals  Patient Stated Goal: regain independence PT Goal Formulation: With patient/family Time For Goal Achievement: 04/23/16 Potential to Achieve Goals: Good    Frequency 7X/week   Barriers to discharge        Co-evaluation               End of Session   Activity Tolerance: Patient tolerated treatment well Patient left: in chair;with call bell/phone within reach;with family/visitor present           Time: [id number] PT Time Calculation (min) (ACUTE ONLY): 15  min   Charges:   PT Evaluation $PT Eval Low Complexity: 1 Procedure     PT G Codes:        Weston Anna, MPT Pager: [phone]

## 2016-04-16 NOTE — Anesthesia Procedure Notes (Signed)
Spinal Patient location during procedure: OR Start time: 04/16/2016 10:16 AM End time: 04/16/2016 10:25 AM Staffing Anesthesiologist: Rod Mae Resident/CRNA: Danley Danker L Performed by: anesthesiologist and resident/CRNA  Preanesthetic Checklist Completed: patient identified, site marked, surgical consent, pre-op evaluation, timeout performed, IV checked, risks and benefits discussed and monitors and equipment checked Spinal Block Patient position: sitting Prep: Betadine Patient monitoring: heart rate, continuous pulse ox and blood pressure Approach: midline Location: L3-4 Injection technique: single-shot Needle Needle type: Sprotte  Needle gauge: 24 G Needle length: 10 cm Additional Notes Kit expiration 05/28/2017 and lot # [id number] Clear CSF, negative heme, negative paresthesia Returned to supine position and tolerated well

## 2016-04-16 NOTE — Transfer of Care (Signed)
Immediate Anesthesia Transfer of Care Note  Patient: [name]  Procedure(s) Performed: Procedure(s): TOTAL HIP ARTHROPLASTY ANTERIOR APPROACH (Left)  Patient Location: PACU  Anesthesia Type:Spinal  Level of Consciousness: awake, alert  and oriented  Airway & Oxygen Therapy: Patient Spontanous Breathing and Patient connected to face mask oxygen  Post-op Assessment: Report given to RN and Post -op Vital signs reviewed and stable  Post vital signs: Reviewed and stable  Last Vitals:  Filed Vitals:   04/16/16 0845  BP: 152/88  Pulse: 61  Temp: 36.4 C  Resp: 16    Complications: No apparent anesthesia complications

## 2016-04-16 NOTE — Op Note (Signed)
OPERATIVE REPORT  PREOPERATIVE DIAGNOSIS: Osteoarthritis of the Left hip.   POSTOPERATIVE DIAGNOSIS: Osteoarthritis of the Left  hip.   PROCEDURE: Left total hip arthroplasty, anterior approach.   SURGEON: Gaynelle Arabian, MD   ASSISTANT: Molli Barrows, PA-C  ANESTHESIA:  Spinal  ESTIMATED BLOOD LOSS:-325 ml    DRAINS: Hemovac x1.   COMPLICATIONS: None   CONDITION: PACU - hemodynamically stable.   BRIEF CLINICAL NOTE: DHILAN [name] is a 65 y.o. male who has advanced end-  stage arthritis of his Left  hip with progressively worsening pain and  dysfunction.The patient has failed nonoperative management and presents for  total hip arthroplasty.   PROCEDURE IN DETAIL: After successful administration of spinal  anesthetic, the traction boots for the [facility] bed were placed on both  feet and the patient was placed onto the [facility] bed, boots placed into the leg  holders. The Left hip was then isolated from the perineum with plastic  drapes and prepped and draped in the usual sterile fashion. ASIS and  greater trochanter were marked and a oblique incision was made, starting  at about 1 cm lateral and 2 cm distal to the ASIS and coursing towards  the anterior cortex of the femur. The skin was cut with a 10 blade  through subcutaneous tissue to the level of the fascia overlying the  tensor fascia lata muscle. The fascia was then incised in line with the  incision at the junction of the anterior third and posterior 2/3rd. The  muscle was teased off the fascia and then the interval between the TFL  and the rectus was developed. The Hohmann retractor was then placed at  the top of the femoral neck over the capsule. The vessels overlying the  capsule were cauterized and the fat on top of the capsule was removed.  A Hohmann retractor was then placed anterior underneath the rectus  femoris to give exposure to the entire anterior capsule. A T-shaped  capsulotomy was performed. The  edges were tagged and the femoral head  was identified.       Osteophytes are removed off the superior acetabulum.  The femoral neck was then cut in situ with an oscillating saw. Traction  was then applied to the left lower extremity utilizing the [facility]  traction. The femoral head was then removed. Retractors were placed  around the acetabulum and then circumferential removal of the labrum was  performed. Osteophytes were also removed. Reaming starts at 47 mm to  medialize and  Increased in 2 mm increments to 53 mm. We reamed in  approximately 40 degrees of abduction, 20 degrees anteversion. A 54 mm  pinnacle acetabular shell was then impacted in anatomic position under  fluoroscopic guidance with excellent purchase. We did not need to place  any additional dome screws. A 36 mm neutral + 4 marathon liner was then  placed into the acetabular shell.       The femoral lift was then placed along the lateral aspect of the femur  just distal to the vastus ridge. The leg was  externally rotated and capsule  was stripped off the inferior aspect of the femoral neck down to the  level of the lesser trochanter, this was done with electrocautery. The femur was lifted after this was performed. The  leg was then placed and extended in adducted position to essentially delivering the femur. We also removed the capsule superiorly and the  piriformis from the  piriformis fossa to gain excellent exposure of the  proximal femur. Rongeur was used to remove some cancellous bone to get  into the lateral portion of the proximal femur for placement of the  initial starter reamer. The starter broaches was placed  the starter broach  and was shown to go down the center of the canal. Broaching  with the  Corail system was then performed starting at size 8, coursing  Up to size 9. A size 9 had excellent torsional and rotational  and axial stability as he had an extremely narrow femoral canal. The trial high offset neck  was then placed  with a 36 + 1.5 trial head. The hip was then reduced. We confirmed that  the stem was in the canal both on AP and lateral x-rays. It also has excellent sizing. The hip was reduced with outstanding stability through full extension, full external rotation,  and then flexion in adduction internal rotation. AP pelvis was taken  and the leg lengths were measured and found to be exactly equal. Hip  was then dislocated again and the femoral head and neck removed. The  femoral broach was removed. Size 9 Corail stem with a high offset  neck was then impacted into the femur following native anteversion. Has  excellent purchase in the canal. Excellent torsional and rotational and  axial stability. It is confirmed to be in the canal on AP and lateral  fluoroscopic views. The 36 + 1.5 ceramic head was placed and the hip  reduced with outstanding stability. Again AP pelvis was taken and it  confirmed that the leg lengths were equal. The wound was then copiously  irrigated with saline solution and the capsule reattached and repaired  with Ethibond suture. 30 ml of .25% Bupivicaine injected into the capsule and into the edge of the tensor fascia lata as well as subcutaneous tissue. The fascia overlying the tensor fascia lata was  then closed with a running #1 V-Loc. Subcu was closed with interrupted  2-0 Vicryl and subcuticular running 4-0 Monocryl. Incision was cleaned  and dried. Steri-Strips and a bulky sterile dressing applied. Hemovac  drain was hooked to suction and then he was awakened and transported to  recovery in stable condition.        Please note that a surgical assistant was a medical necessity for this procedure to perform it in a safe and expeditious manner. Assistant was necessary to provide appropriate retraction of vital neurovascular structures and to prevent femoral fracture and allow for anatomic placement of the prosthesis.  Gaynelle Arabian, M.D.

## 2016-04-16 NOTE — Anesthesia Postprocedure Evaluation (Signed)
Anesthesia Post Note  Patient: [name]  Procedure(s) Performed: Procedure(s) (LRB): TOTAL HIP ARTHROPLASTY ANTERIOR APPROACH (Left)  Patient location during evaluation: PACU Anesthesia Type: Spinal Level of consciousness: awake and alert Pain management: pain level controlled Vital Signs Assessment: post-procedure vital signs reviewed and stable Respiratory status: spontaneous breathing, nonlabored ventilation, respiratory function stable and patient connected to nasal cannula oxygen Cardiovascular status: blood pressure returned to baseline and stable Postop Assessment: no signs of nausea or vomiting Anesthetic complications: no    Last Vitals:  Filed Vitals:   04/16/16 1506 04/16/16 1611  BP: 130/78 109/61  Pulse: 58 66  Temp: 36.9 C 37.1 C  Resp: 18 18    Last Pain:  Filed Vitals:   04/16/16 1611  PainSc: 1                  EWELL,CHARLES L

## 2016-04-16 NOTE — Interval H&P Note (Signed)
History and Physical Interval Note:  04/16/2016 9:38 AM  [name]  has presented today for surgery, with the diagnosis of LEFT HIP OA  The various methods of treatment have been discussed with the patient and family. After consideration of risks, benefits and other options for treatment, the patient has consented to  Procedure(s): TOTAL HIP ARTHROPLASTY ANTERIOR APPROACH (Left) as a surgical intervention .  The patient's history has been reviewed, patient examined, no change in status, stable for surgery.  I have reviewed the patient's chart and labs.  Questions were answered to the patient's satisfaction.     Gearlean Alf

## 2016-04-17 LAB — BASIC METABOLIC PANEL
Anion gap: 9 (ref 5–15)
BUN: 12 mg/dL (ref 6–20)
CO2: 23 mmol/L (ref 22–32)
Calcium: 8.6 mg/dL — ABNORMAL LOW (ref 8.9–10.3)
Chloride: 108 mmol/L (ref 101–111)
Creatinine, Ser: 0.78 mg/dL (ref 0.61–1.24)
GFR calc Af Amer: >60 mL/min (ref >60)
GFR calc non Af Amer: >60 mL/min (ref >60)
Glucose, Bld: 153 mg/dL — ABNORMAL HIGH (ref 65–99)
Potassium: 4.3 mmol/L (ref 3.5–5.1)
Sodium: 140 mmol/L (ref 135–145)

## 2016-04-17 LAB — CBC
HCT: 35.1 % — ABNORMAL LOW (ref 39.0–52.0)
Hemoglobin: 12.4 g/dL — ABNORMAL LOW (ref 13.0–17.0)
MCH: 30.4 pg (ref 26.0–34.0)
MCHC: 35.3 g/dL (ref 30.0–36.0)
MCV: 86 fL (ref 78.0–100.0)
Platelets: 162 10*3/uL (ref 150–400)
RBC: 4.08 MIL/uL — ABNORMAL LOW (ref 4.22–5.81)
RDW: 12.5 % (ref 11.5–15.5)
WBC: 10.6 10*3/uL — ABNORMAL HIGH (ref 4.0–10.5)

## 2016-04-17 MED ORDER — RIVAROXABAN 10 MG PO TABS: 10.0000 mg | ORAL_TABLET | Freq: Every day | ORAL | Status: DC

## 2016-04-17 MED ORDER — METHOCARBAMOL 500 MG PO TABS: 500.0000 mg | ORAL_TABLET | Freq: Four times a day (QID) | ORAL | Status: DC | PRN

## 2016-04-17 MED ORDER — OXYCODONE HCL 5 MG PO TABS: 5.0000 mg | ORAL_TABLET | ORAL | Status: DC | PRN

## 2016-04-17 MED ORDER — TRAMADOL HCL 50 MG PO TABS: 50.0000 mg | ORAL_TABLET | Freq: Four times a day (QID) | ORAL | Status: DC | PRN

## 2016-04-17 MED FILL — METHOCARBAMOL 500 MG TABLET: 500 | 10 days supply | Qty: 40 | Fill #0

## 2016-04-17 MED FILL — XARELTO 10 MG TABLET: 10 | 20 days supply | Qty: 20 | Fill #0

## 2016-04-17 MED FILL — traMADol HCL 50 MG TABS: 50 | 7 days supply | Qty: 60 | Fill #0

## 2016-04-17 MED FILL — oxyCODONE HCL 5 MG TABS: 5 | 5 days supply | Qty: 80 | Fill #0

## 2016-04-17 NOTE — Progress Notes (Signed)
OT Cancellation Note  Patient Details Name: [name] MRN: [id number] DOB: [birth year]   Cancelled Treatment:    Reason Eval/Treat Not Completed: OT screened, no needs identified, will sign off. Spoke to patient. He reports having B TKAs and R THA in the past and is familiar with all ADL techniques. He also has all needed bathroom DME. He denies need for OT intervention at this time. Will sign off.  Early, Leila A 04/17/2016, 9:41 AM

## 2016-04-17 NOTE — Discharge Summary (Signed)
Physician Discharge Summary   Patient ID: [name] MRN: [id number] DOB/AGE: [birth year] 65 y.o.  Admit date: 04/16/2016 Discharge date: 04/17/2016  Primary Diagnosis: Primary osteoarthritis left hip  Admission Diagnoses:  Past Medical History  Diagnosis Date  . COLONIC POLYPS, HX OF 2007    clear colo w/o polyps 04/2015: 28yrfollow up  . Arthritis     Knees  . OBESITY   . HYPOTHYROIDISM   . HYPERTENSION   . BPH (benign prostatic hypertrophy)    Discharge Diagnoses:   Principal Problem:   OA (osteoarthritis) of hip  Estimated body mass index is 37.42 kg/(m^2) as calculated from the following:   Height as of this encounter: 6' (1.829 m).   Weight as of this encounter: 125.193 kg (276 lb).  Procedure(s) (LRB): TOTAL HIP ARTHROPLASTY ANTERIOR APPROACH (Left)   Consults: None  HPI: The patient is a 65year old male who presents today for follow up of their hip. The patient is being followed for their left hip pain and osteoarthritis. The cortisone provided some benefit, but he is still having a lot of pain and dysfunction. He is ready to proceed with surgery. They have been treated conservatively in the past for the above stated problem and despite conservative measures, they continue to have progressive pain and severe functional limitations and dysfunction. They have failed non-operative management including home exercise, medications, and injections. It is felt that they would benefit from undergoing total joint replacement. Risks and benefits of the procedure have been discussed with the patient and they elect to proceed with surgery. There are no active contraindications to surgery such as ongoing infection or rapidly progressive neurological disease.  Laboratory Data: Admission on 04/16/2016, Discharged on 04/17/2016  Component Date Value Ref Range Status  . WBC 04/17/2016 10.6* 4.0 - 10.5 K/uL Final  . RBC 04/17/2016 4.08* 4.22 - 5.81 MIL/uL Final  . Hemoglobin  04/17/2016 12.4* 13.0 - 17.0 g/dL Final  . HCT 04/17/2016 35.1* 39.0 - 52.0 % Final  . MCV 04/17/2016 86.0  78.0 - 100.0 fL Final  . MCH 04/17/2016 30.4  26.0 - 34.0 pg Final  . MCHC 04/17/2016 35.3  30.0 - 36.0 g/dL Final  . RDW 04/17/2016 12.5  11.5 - 15.5 % Final  . Platelets 04/17/2016 162  150 - 400 K/uL Final  . Sodium 04/17/2016 140  135 - 145 mmol/L Final  . Potassium 04/17/2016 4.3  3.5 - 5.1 mmol/L Final  . Chloride 04/17/2016 108  101 - 111 mmol/L Final  . CO2 04/17/2016 23  22 - 32 mmol/L Final  . Glucose, Bld 04/17/2016 153* 65 - 99 mg/dL Final  . BUN 04/17/2016 12  6 - 20 mg/dL Final  . Creatinine, Ser 04/17/2016 0.78  0.61 - 1.24 mg/dL Final  . Calcium 04/17/2016 8.6* 8.9 - 10.3 mg/dL Final  . GFR calc non Af Amer 04/17/2016 >60  >60 mL/min Final  . GFR calc Af Amer 04/17/2016 >60  >60 mL/min Final   Comment: (NOTE) The eGFR has been calculated using the CKD EPI equation. This calculation has not been validated in all clinical situations. eGFR's persistently <60 mL/min signify possible Chronic Kidney Disease.   .Georgiann Hahngap 04/17/2016 9  5 - 15 Final  Hospital Outpatient Visit on 04/08/2016  Component Date Value Ref Range Status  . MRSA, PCR 04/08/2016 NEGATIVE  NEGATIVE Final  . Staphylococcus aureus 04/08/2016 NEGATIVE  NEGATIVE Final   Comment:        The Xpert  SA Assay (FDA approved for NASAL specimens in patients over 78 years of age), is one component of a comprehensive surveillance program.  Test performance has been validated by [facility] for patients greater than or equal to 27 year old. It is not intended to diagnose infection nor to guide or monitor treatment.   Marland Kitchen aPTT 04/08/2016 29  24 - 37 seconds Final  . WBC 04/08/2016 5.0  4.0 - 10.5 K/uL Final  . RBC 04/08/2016 4.90  4.22 - 5.81 MIL/uL Final  . Hemoglobin 04/08/2016 14.9  13.0 - 17.0 g/dL Final  . HCT 04/08/2016 41.9  39.0 - 52.0 % Final  . MCV 04/08/2016 85.5  78.0 - 100.0 fL Final  .  MCH 04/08/2016 30.4  26.0 - 34.0 pg Final  . MCHC 04/08/2016 35.6  30.0 - 36.0 g/dL Final  . RDW 04/08/2016 12.5  11.5 - 15.5 % Final  . Platelets 04/08/2016 160  150 - 400 K/uL Final  . Sodium 04/08/2016 141  135 - 145 mmol/L Final  . Potassium 04/08/2016 4.0  3.5 - 5.1 mmol/L Final  . Chloride 04/08/2016 108  101 - 111 mmol/L Final  . CO2 04/08/2016 25  22 - 32 mmol/L Final  . Glucose, Bld 04/08/2016 102* 65 - 99 mg/dL Final  . BUN 04/08/2016 12  6 - 20 mg/dL Final  . Creatinine, Ser 04/08/2016 0.77  0.61 - 1.24 mg/dL Final  . Calcium 04/08/2016 10.0  8.9 - 10.3 mg/dL Final  . Total Protein 04/08/2016 7.6  6.5 - 8.1 g/dL Final  . Albumin 04/08/2016 4.6  3.5 - 5.0 g/dL Final  . AST 04/08/2016 22  15 - 41 U/L Final  . ALT 04/08/2016 23  17 - 63 U/L Final  . Alkaline Phosphatase 04/08/2016 76  38 - 126 U/L Final  . Total Bilirubin 04/08/2016 0.5  0.3 - 1.2 mg/dL Final  . GFR calc non Af Amer 04/08/2016 >60  >60 mL/min Final  . GFR calc Af Amer 04/08/2016 >60  >60 mL/min Final   Comment: (NOTE) The eGFR has been calculated using the CKD EPI equation. This calculation has not been validated in all clinical situations. eGFR's persistently <60 mL/min signify possible Chronic Kidney Disease.   . Anion gap 04/08/2016 8  5 - 15 Final  . Prothrombin Time 04/08/2016 13.5  11.6 - 15.2 seconds Final  . INR 04/08/2016 1.01  0.00 - 1.49 Final  . ABO/RH(D) 04/08/2016 O POS   Final  . Antibody Screen 04/08/2016 NEG   Final  . Sample Expiration 04/08/2016 04/19/2016   Final  . Extend sample reason 04/08/2016 NO TRANSFUSIONS OR PREGNANCY IN THE PAST 3 MONTHS   Final  . Color, Urine 04/08/2016 YELLOW  YELLOW Final  . APPearance 04/08/2016 CLEAR  CLEAR Final  . Specific Gravity, Urine 04/08/2016 1.026  1.005 - 1.030 Final  . pH 04/08/2016 5.5  5.0 - 8.0 Final  . Glucose, UA 04/08/2016 NEGATIVE  NEGATIVE mg/dL Final  . Hgb urine dipstick 04/08/2016 NEGATIVE  NEGATIVE Final  . Bilirubin Urine  04/08/2016 NEGATIVE  NEGATIVE Final  . Ketones, ur 04/08/2016 NEGATIVE  NEGATIVE mg/dL Final  . Protein, ur 04/08/2016 NEGATIVE  NEGATIVE mg/dL Final  . Nitrite 04/08/2016 NEGATIVE  NEGATIVE Final  . Leukocytes, UA 04/08/2016 NEGATIVE  NEGATIVE Final   MICROSCOPIC NOT DONE ON URINES WITH NEGATIVE PROTEIN, BLOOD, LEUKOCYTES, NITRITE, OR GLUCOSE <1000 mg/dL.     X-Rays:Dg Pelvis Portable  04/16/2016  CLINICAL DATA:  Status post left  hip replacement EXAM: PORTABLE PELVIS 1-2 VIEWS COMPARISON:  None. FINDINGS: Bilateral hip replacements are noted. Postsurgical changes are seen on the left. A surgical drain is noted in place. No acute bony abnormality is noted. IMPRESSION: Status post left hip replacement Electronically Signed   By: Inez Catalina M.D.   On: 04/16/2016 12:44   Dg C-arm 1-60 Min-no Report  04/16/2016  CLINICAL DATA: surgery C-ARM 1-60 MINUTES Fluoroscopy was utilized by the requesting physician.  No radiographic interpretation.    EKG: Orders placed or performed in visit on 02/22/16  . EKG 12-Lead     Hospital Course: Patient was admitted to [facility] and taken to the OR and underwent the above state procedure without complications.  Patient tolerated the procedure well and was later transferred to the recovery room and then to the orthopaedic floor for postoperative care.  They were given PO and IV analgesics for pain control following their surgery.  They were given 24 hours of postoperative antibiotics of  Anti-infectives    Start     Dose/Rate Route Frequency Ordered Stop   04/16/16 1630  ceFAZolin (ANCEF) IVPB 2g/100 mL premix     2 g 200 mL/hr over 30 Minutes Intravenous Every 6 hours 04/16/16 1310 04/16/16 2202   04/16/16 0600  ceFAZolin (ANCEF) 3 g in dextrose 5 % 50 mL IVPB     3 g 130 mL/hr over 30 Minutes Intravenous On call to O.R. 04/15/16 1327 04/16/16 1030     and started on DVT prophylaxis in the form of Xarelto.   PT and OT were ordered for total hip  protocol.  The patient was allowed to be WBAT with therapy. Discharge planning was consulted to help with postop disposition and equipment needs.  Patient had a good night on the evening of surgery.  They started to get up OOB with therapy on day one.  Hemovac drain was pulled without difficulty.  The knee immobilizer was removed and discontinued. Patient was seen in rounds and was ready to go home.   Diet: Cardiac diet Activity:WBAT No bending hip over 90 degrees- A "L" Angle Do not cross legs Do not let foot roll inward When turning these patients a pillow should be placed between the patient's legs to prevent crossing. Patients should have the affected knee fully extended when trying to sit or stand from all surfaces to prevent excessive hip flexion. When ambulating and turning toward the affected side the affected leg should have the toes turned out prior to moving the walker and the rest of patient's body as to prevent internal rotation/ turning in of the leg. Abduction pillows are the most effective way to prevent a patient from not crossing legs or turning toes in at rest. If an abduction pillow is not ordered placing a regular pillow length wise between the patient's legs is also an effective reminder. It is imperative that these precautions be maintained so that the surgical hip does not dislocate. Follow-up:in 2 weeks Disposition - Home Discharged Condition: stable   Discharge Instructions    Call MD / Call 911    Complete by:  As directed   If you experience chest pain or shortness of breath, CALL 911 and be transported to the hospital emergency room.  If you develope a fever above 101 F, pus (white drainage) or increased drainage or redness at the wound, or calf pain, call your surgeon's office.     Constipation Prevention    Complete by:  As  directed   Drink plenty of fluids.  Prune juice may be helpful.  You may use a stool softener, such as Colace (over the counter) 100 mg twice  a day.  Use MiraLax (over the counter) for constipation as needed.     Diet - low sodium heart healthy    Complete by:  As directed      Discharge instructions    Complete by:  As directed   Dr. Gaynelle Arabian Total Joint Specialist [facility] [address] [place], [place] [address] [phone]  ANTERIOR APPROACH TOTAL HIP REPLACEMENT POSTOPERATIVE DIRECTIONS   Hip Rehabilitation, Guidelines Following Surgery  The results of a hip operation are greatly improved after range of motion and muscle strengthening exercises. Follow all safety measures which are given to protect your hip. If any of these exercises cause increased pain or swelling in your joint, decrease the amount until you are comfortable again. Then slowly increase the exercises. Call your caregiver if you have problems or questions.   HOME CARE INSTRUCTIONS  Remove items at home which could result in a fall. This includes throw rugs or furniture in walking pathways.  ICE to the affected hip every three hours for 30 minutes at a time and then as needed for pain and swelling.  Continue to use ice on the hip for pain and swelling from surgery. You may notice swelling that will progress down to the foot and ankle.  This is normal after surgery.  Elevate the leg when you are not up walking on it.   Continue to use the breathing machine which will help keep your temperature down.  It is common for your temperature to cycle up and down following surgery, especially at night when you are not up moving around and exerting yourself.  The breathing machine keeps your lungs expanded and your temperature down.   DIET You may resume your previous home diet once your are discharged from the hospital.  DRESSING / WOUND CARE / SHOWERING You may start showering once you are discharged home but do not submerge the incision under water. Just pat the incision dry and apply a dry gauze dressing on daily. Change the  surgical dressing daily and reapply a dry dressing each time.  ACTIVITY Walk with your walker as instructed. Use walker as long as suggested by your caregivers. Avoid periods of inactivity such as sitting longer than an hour when not asleep. This helps prevent blood clots.  You may resume a sexual relationship in one month or when given the OK by your doctor.  You may return to work once you are cleared by your doctor.  Do not drive a car for 6 weeks or until released by you surgeon.  Do not drive while taking narcotics.  WEIGHT BEARING Weight bearing as tolerated with assist device (walker, cane, etc) as directed, use it as long as suggested by your surgeon or therapist, typically at least 4-6 weeks.  POSTOPERATIVE CONSTIPATION PROTOCOL Constipation - defined medically as fewer than three stools per week and severe constipation as less than one stool per week.  One of the most common issues patients have following surgery is constipation.  Even if you have a regular bowel pattern at home, your normal regimen is likely to be disrupted due to multiple reasons following surgery.  Combination of anesthesia, postoperative narcotics, change in appetite and fluid intake all can affect your bowels.  In order to avoid complications following surgery, here are some recommendations  in order to help you during your recovery period.  Colace (docusate) - Pick up an over-the-counter form of Colace or another stool softener and take twice a day as long as you are requiring postoperative pain medications.  Take with a full glass of water daily.  If you experience loose stools or diarrhea, hold the colace until you stool forms back up.  If your symptoms do not get better within 1 week or if they get worse, check with your doctor.  Dulcolax (bisacodyl) - Pick up over-the-counter and take as directed by the product packaging as needed to assist with the movement of your bowels.  Take with a full glass of water.   Use this product as needed if not relieved by Colace only.   MiraLax (polyethylene glycol) - Pick up over-the-counter to have on hand.  MiraLax is a solution that will increase the amount of water in your bowels to assist with bowel movements.  Take as directed and can mix with a glass of water, juice, soda, coffee, or tea.  Take if you go more than two days without a movement. Do not use MiraLax more than once per day. Call your doctor if you are still constipated or irregular after using this medication for 7 days in a row.  If you continue to have problems with postoperative constipation, please contact the office for further assistance and recommendations.  If you experience "the worst abdominal pain ever" or develop nausea or vomiting, please contact the office immediatly for further recommendations for treatment.  ITCHING  If you experience itching with your medications, try taking only a single pain pill, or even half a pain pill at a time.  You can also use Benadryl over the counter for itching or also to help with sleep.   TED HOSE STOCKINGS Wear the elastic stockings on both legs for three weeks following surgery during the day but you may remove then at night for sleeping.  MEDICATIONS See your medication summary on the "After Visit Summary" that the nursing staff will review with you prior to discharge.  You may have some home medications which will be placed on hold until you complete the course of blood thinner medication.  It is important for you to complete the blood thinner medication as prescribed by your surgeon.  Continue your approved medications as instructed at time of discharge.  PRECAUTIONS If you experience chest pain or shortness of breath - call 911 immediately for transfer to the hospital emergency department.  If you develop a fever greater that 101 F, purulent drainage from wound, increased redness or drainage from wound, foul odor from the wound/dressing, or calf  pain - CONTACT YOUR SURGEON.                                                   FOLLOW-UP APPOINTMENTS Make sure you keep all of your appointments after your operation with your surgeon and caregivers. You should call the office at the above phone number and make an appointment for approximately two weeks after the date of your surgery or on the date instructed by your surgeon outlined in the "After Visit Summary".  RANGE OF MOTION AND STRENGTHENING EXERCISES  These exercises are designed to help you keep full movement of your hip joint. Follow your caregiver's or physical therapist's instructions. Perform all  exercises about fifteen times, three times per day or as directed. Exercise both hips, even if you have had only one joint replacement. These exercises can be done on a training (exercise) mat, on the floor, on a table or on a bed. Use whatever works the best and is most comfortable for you. Use music or television while you are exercising so that the exercises are a pleasant break in your day. This will make your life better with the exercises acting as a break in routine you can look forward to.  Lying on your back, slowly slide your foot toward your buttocks, raising your knee up off the floor. Then slowly slide your foot back down until your leg is straight again.  Lying on your back spread your legs as far apart as you can without causing discomfort.  Lying on your side, raise your upper leg and foot straight up from the floor as far as is comfortable. Slowly lower the leg and repeat.  Lying on your back, tighten up the muscle in the front of your thigh (quadriceps muscles). You can do this by keeping your leg straight and trying to raise your heel off the floor. This helps strengthen the largest muscle supporting your knee.  Lying on your back, tighten up the muscles of your buttocks both with the legs straight and with the knee bent at a comfortable angle while keeping your heel on the floor.     IF YOU ARE TRANSFERRED TO A SKILLED REHAB FACILITY If the patient is transferred to a skilled rehab facility following release from the hospital, a list of the current medications will be sent to the facility for the patient to continue.  When discharged from the skilled rehab facility, please have the facility set up the patient's [place] prior to being released. Also, the skilled facility will be responsible for providing the patient with their medications at time of release from the facility to include their pain medication, the muscle relaxants, and their blood thinner medication. If the patient is still at the rehab facility at time of the two week follow up appointment, the skilled rehab facility will also need to assist the patient in arranging follow up appointment in our office and any transportation needs.  MAKE SURE YOU:  Understand these instructions.  Get help right away if you are not doing well or get worse.    Pick up stool softner and laxative for home use following surgery while on pain medications. Do not submerge incision under water. Please use good hand washing techniques while changing dressing each day. May shower starting three days after surgery. Please use a clean towel to pat the incision dry following showers. Continue to use ice for pain and swelling after surgery. Do not use any lotions or creams on the incision until instructed by your surgeon.     Increase activity slowly as tolerated    Complete by:  As directed             Medication List    STOP taking these medications        aspirin 81 MG tablet     meloxicam 15 MG tablet  Commonly known as:  MOBIC     MULTIVITAMIN ADULT PO      TAKE these medications        alfuzosin 10 MG 24 hr tablet  Commonly known as:  UROXATRAL  Take 10 mg by mouth every evening.  amLODipine 5 MG tablet  Commonly known as:  NORVASC  Take 1 tablet (5 mg total) by mouth daily.      levothyroxine 137 MCG tablet  Commonly known as:  LEVOTHROID  Take 1 tablet (137 mcg total) by mouth daily before breakfast.     meclizine 12.5 MG tablet  Commonly known as:  ANTIVERT  Take 1 tablet (12.5 mg total) by mouth 3 (three) times daily as needed for dizziness.     methocarbamol 500 MG tablet  Commonly known as:  ROBAXIN  Take 1 tablet (500 mg total) by mouth every 6 (six) hours as needed for muscle spasms.     oxyCODONE 5 MG immediate release tablet  Commonly known as:  Oxy IR/ROXICODONE  Take 1-2 tablets (5-10 mg total) by mouth every 3 (three) hours as needed for breakthrough pain.     rivaroxaban 10 MG Tabs tablet  Commonly known as:  XARELTO  Take 1 tablet (10 mg total) by mouth daily with breakfast.     traMADol 50 MG tablet  Commonly known as:  ULTRAM  Take 1-2 tablets (50-100 mg total) by mouth every 6 (six) hours as needed for moderate pain.           Follow-up Information    Follow up with Gearlean Alf, MD. Schedule an appointment as soon as possible for a visit on 04/29/2016.   Specialty:  Orthopedic Surgery   Why:  Call [phone] tomorrow to make the appointment   Contact information:   [address] [place] [address] [phone]       Signed: Ardeen Jourdain, PA-C Orthopaedic Surgery 04/17/2016, 1:54 PM

## 2016-04-17 NOTE — Progress Notes (Signed)
Physical Therapy Treatment Patient Details Name: [name] MRN: [id number] DOB: 02-10-1951 Today's Date: 04/17/2016    History of Present Illness 65 yo male s/p L THA 04/16/16    PT Comments    Progressing well. Minimal pain with activity. Reviewed/practiced gait training, stair training, and exercises. Issued HEP at wife's request. All education completed. Ready to d/c from PT standpoint.   Follow Up Recommendations  Home health PT     Equipment Recommendations  None recommended by PT (pt/wife stated they will borrow a walker)    Recommendations for Other Services       Precautions / Restrictions Precautions Precautions: Fall Restrictions Weight Bearing Restrictions: No LLE Weight Bearing: Weight bearing as tolerated    Mobility  Bed Mobility Overal bed mobility: Needs Assistance Bed Mobility: Supine to Sit     Supine to sit: Supervision     General bed mobility comments:  for safety  Transfers Overall transfer level: Needs assistance Equipment used: Rolling walker (2 wheeled) Transfers: Sit to/from Stand Sit to Stand: Supervision         General transfer comment: for safety  Ambulation/Gait Ambulation/Gait assistance: Supervision Ambulation Distance (Feet): 200 Feet Assistive device: Rolling walker (2 wheeled) Gait Pattern/deviations: Step-through pattern;Decreased stride length     General Gait Details: for safety. Practiced with crutches in room. Pt prefers to use a walker.    Stairs Stairs: Yes Stairs assistance: Supervision Stair Management: One rail Left;Step to pattern;Forwards Number of Stairs: 4 General stair comments: VCs safety, seqeunce. supervision for safety  Wheelchair Mobility    Modified Rankin (Stroke Patients Only)       Balance                                    Cognition Arousal/Alertness: Awake/alert Behavior During Therapy: WFL for tasks assessed/performed Overall Cognitive Status: Within  Functional Limits for tasks assessed                      Exercises Total Joint Exercises Ankle Circles/Pumps: AROM;15 reps;Supine Quad Sets: AROM;15 reps;Supine Heel Slides: AROM;Left;Supine;15 reps Hip ABduction/ADduction: AROM;Left;Supine;15 reps Knee Flexion: AROM;Left;10 reps;Standing;Limitations Knee Flexion Limitations: knee ROM Marching in Standing: AROM;Left;10 reps;Standing;Limitations Marching in Standing Limitations: hip ROM General Exercises - Lower Extremity Heel Raises: AROM;10 reps;Standing    General Comments        Pertinent Vitals/Pain Pain Assessment: 0-10 Pain Score: 3  Pain Location: L thigh Pain Descriptors / Indicators: Sore Pain Intervention(s): Monitored during session;Repositioned    Home Living                      Prior Function            PT Goals (current goals can now be found in the care plan section) Progress towards PT goals: Progressing toward goals    Frequency  7X/week    PT Plan Current plan remains appropriate    Co-evaluation             End of Session   Activity Tolerance: Patient tolerated treatment well Patient left: in chair;with call bell/phone within reach;with family/visitor present     Time: [id number] PT Time Calculation (min) (ACUTE ONLY): 26 min  Charges:  $Gait Training: 8-22 mins $Therapeutic Exercise: 8-22 mins                    G Codes:  Weston Anna, MPT Pager: [phone]

## 2016-04-17 NOTE — Consult Note (Signed)
   [facility] Inpatient Consult   04/17/2016  PAGE RAMAGLIA [birth year] [id number]   Came to bedside to visit patient on behalf of Link to [facility] Care Management program for [facility] Health employees/dependents with [facility] insurance. Mr. Francia states he has no Link to Wellness follow up needs. Declines the need for post hospital follow up call. Left brochure and contact information at bedside.   Marthenia Rolling, MSN-Ed, RN,BSN [facility] Liaison [phone]

## 2016-04-17 NOTE — Progress Notes (Signed)
   Subjective: 1 Day Post-Op Procedure(s) (LRB): TOTAL HIP ARTHROPLASTY ANTERIOR APPROACH (Left) Patient reports pain as mild.   Patient seen in rounds with Dr. Wynelle Link. Patient is well, and has had no acute complaints or problems. He reports that he is feeling much better already. Plan is to go Home after hospital stay.  Objective: Vital signs in last 24 hours: Temp:  [97.3 F (36.3 C)-98.7 F (37.1 C)] 98.2 F (36.8 C) (04/20 0610) Pulse Rate:  [50-77] 77 (04/20 0610) Resp:  [9-19] 18 (04/20 0610) BP: (102-152)/(56-88) 130/67 mmHg (04/20 0610) SpO2:  [96 %-100 %] 96 % (04/20 0610) Weight:  [125.193 kg (276 lb)] 125.193 kg (276 lb) (04/19 0906)  Intake/Output from previous day:  Intake/Output Summary (Last 24 hours) at 04/17/16 0647 Last data filed at 04/17/16 0610  Gross per 24 hour  Intake   3650 ml  Output   1105 ml  Net   2545 ml    Intake/Output this shift: Total I/O In: 1850 [I.V.:1150; Other:600; IV Piggyback:100] Out: 230 [Urine:225; Drains:5]  Labs:  Recent Labs  04/17/16 0408  HGB 12.4*    Recent Labs  04/17/16 0408  WBC 10.6*  RBC 4.08*  HCT 35.1*  PLT 162    Recent Labs  04/17/16 0408  NA 140  K 4.3  CL 108  CO2 23  BUN 12  CREATININE 0.78  GLUCOSE 153*  CALCIUM 8.6*    EXAM General - Patient is Alert and Oriented Extremity - Neurologically intact Intact pulses distally Dorsiflexion/Plantar flexion intact No cellulitis present Compartment soft Dressing - dressing C/D/I Motor Function - intact, moving foot and toes well on exam.  Hemovac pulled without difficulty.  Past Medical History  Diagnosis Date  . COLONIC POLYPS, HX OF 2007    clear colo w/o polyps 04/2015: 39yr follow up  . Arthritis     Knees  . OBESITY   . HYPOTHYROIDISM   . HYPERTENSION   . BPH (benign prostatic hypertrophy)     Assessment/Plan: 1 Day Post-Op Procedure(s) (LRB): TOTAL HIP ARTHROPLASTY ANTERIOR APPROACH (Left) Principal Problem:   OA  (osteoarthritis) of hip  Estimated body mass index is 37.42 kg/(m^2) as calculated from the following:   Height as of this encounter: 6' (1.829 m).   Weight as of this encounter: 125.193 kg (276 lb). Advance diet Up with therapy D/C IV fluids when tolerating POs well Discharge home with home health  DVT Prophylaxis - Xarelto Weight Bearing As Tolerated  D/C Knee Immobilizer Hemovac Pulled  He is doing very well. If progresses well with therapy, DC home this afternoon.   Ardeen Jourdain, PA-C Orthopaedic Surgery 04/17/2016, 6:47 AM

## 2016-04-17 NOTE — Discharge Instructions (Signed)
Dr. Gaynelle Arabian Total Joint Specialist [facility] [address], [place], [place] [address] [phone]  ANTERIOR APPROACH TOTAL HIP REPLACEMENT POSTOPERATIVE DIRECTIONS   Hip Rehabilitation, Guidelines Following Surgery  The results of a hip operation are greatly improved after range of motion and muscle strengthening exercises. Follow all safety measures which are given to protect your hip. If any of these exercises cause increased pain or swelling in your joint, decrease the amount until you are comfortable again. Then slowly increase the exercises. Call your caregiver if you have problems or questions.   HOME CARE INSTRUCTIONS  Remove items at home which could result in a fall. This includes throw rugs or furniture in walking pathways.   ICE to the affected hip every three hours for 30 minutes at a time and then as needed for pain and swelling.  Continue to use ice on the hip for pain and swelling from surgery. You may notice swelling that will progress down to the foot and ankle.  This is normal after surgery.  Elevate the leg when you are not up walking on it.    Continue to use the breathing machine which will help keep your temperature down.  It is common for your temperature to cycle up and down following surgery, especially at night when you are not up moving around and exerting yourself.  The breathing machine keeps your lungs expanded and your temperature down.   DIET You may resume your previous home diet once your are discharged from the hospital.  DRESSING / WOUND CARE / SHOWERING You may start showering once you are discharged home but do not submerge the incision under water. Just pat the incision dry and apply a dry gauze dressing on daily. Change the surgical dressing daily and reapply a dry dressing each time.  ACTIVITY Walk with your walker as instructed. Use walker as long as suggested by your caregivers. Avoid periods of inactivity  such as sitting longer than an hour when not asleep. This helps prevent blood clots.  You may resume a sexual relationship in one month or when given the OK by your doctor.  You may return to work once you are cleared by your doctor.  Do not drive a car for 6 weeks or until released by you surgeon.  Do not drive while taking narcotics.  WEIGHT BEARING Weight bearing as tolerated with assist device (walker, cane, etc) as directed, use it as long as suggested by your surgeon or therapist, typically at least 4-6 weeks.  POSTOPERATIVE CONSTIPATION PROTOCOL Constipation - defined medically as fewer than three stools per week and severe constipation as less than one stool per week.  One of the most common issues patients have following surgery is constipation.  Even if you have a regular bowel pattern at home, your normal regimen is likely to be disrupted due to multiple reasons following surgery.  Combination of anesthesia, postoperative narcotics, change in appetite and fluid intake all can affect your bowels.  In order to avoid complications following surgery, here are some recommendations in order to help you during your recovery period.  Colace (docusate) - Pick up an over-the-counter form of Colace or another stool softener and take twice a day as long as you are requiring postoperative pain medications.  Take with a full glass of water daily.  If you experience loose stools or diarrhea, hold the colace until you stool forms back up.  If your symptoms do not get better within 1  week or if they get worse, check with your doctor. ° °Dulcolax (bisacodyl) - Pick up over-the-counter and take as directed by the product packaging as needed to assist with the movement of your bowels.  Take with a full glass of water.  Use this product as needed if not relieved by Colace only.  ° °MiraLax (polyethylene glycol) - Pick up over-the-counter to have on hand.  MiraLax is a solution that will increase the amount of  water in your bowels to assist with bowel movements.  Take as directed and can mix with a glass of water, juice, soda, coffee, or tea.  Take if you go more than two days without a movement. °Do not use MiraLax more than once per day. Call your doctor if you are still constipated or irregular after using this medication for 7 days in a row. ° °If you continue to have problems with postoperative constipation, please contact the office for further assistance and recommendations.  If you experience "the worst abdominal pain ever" or develop nausea or vomiting, please contact the office immediatly for further recommendations for treatment. ° °ITCHING ° If you experience itching with your medications, try taking only a single pain pill, or even half a pain pill at a time.  You can also use Benadryl over the counter for itching or also to help with sleep.  ° °TED HOSE STOCKINGS °Wear the elastic stockings on both legs for three weeks following surgery during the day but you may remove then at night for sleeping. ° °MEDICATIONS °See your medication summary on the “After Visit Summary” that the nursing staff will review with you prior to discharge.  You may have some home medications which will be placed on hold until you complete the course of blood thinner medication.  It is important for you to complete the blood thinner medication as prescribed by your surgeon.  Continue your approved medications as instructed at time of discharge. ° °PRECAUTIONS °If you experience chest pain or shortness of breath - call 911 immediately for transfer to the hospital emergency department.  °If you develop a fever greater that 101 F, purulent drainage from wound, increased redness or drainage from wound, foul odor from the wound/dressing, or calf pain - CONTACT YOUR SURGEON.   °                                                °FOLLOW-UP APPOINTMENTS °Make sure you keep all of your appointments after your operation with your surgeon and  caregivers. You should call the office at the above phone number and make an appointment for approximately two weeks after the date of your surgery or on the date instructed by your surgeon outlined in the "After Visit Summary". ° °RANGE OF MOTION AND STRENGTHENING EXERCISES  °These exercises are designed to help you keep full movement of your hip joint. Follow your caregiver's or physical therapist's instructions. Perform all exercises about fifteen times, three times per day or as directed. Exercise both hips, even if you have had only one joint replacement. These exercises can be done on a training (exercise) mat, on the floor, on a table or on a bed. Use whatever works the best and is most comfortable for you. Use music or television while you are exercising so that the exercises are a pleasant break in your day. This   will make your life better with the exercises acting as a break in routine you can look forward to.  °Lying on your back, slowly slide your foot toward your buttocks, raising your knee up off the floor. Then slowly slide your foot back down until your leg is straight again.  °Lying on your back spread your legs as far apart as you can without causing discomfort.  °Lying on your side, raise your upper leg and foot straight up from the floor as far as is comfortable. Slowly lower the leg and repeat.  °Lying on your back, tighten up the muscle in the front of your thigh (quadriceps muscles). You can do this by keeping your leg straight and trying to raise your heel off the floor. This helps strengthen the largest muscle supporting your knee.  °Lying on your back, tighten up the muscles of your buttocks both with the legs straight and with the knee bent at a comfortable angle while keeping your heel on the floor.  ° °IF YOU ARE TRANSFERRED TO A SKILLED REHAB FACILITY °If the patient is transferred to a skilled rehab facility following release from the hospital, a list of the current medications will be  sent to the facility for the patient to continue.  When discharged from the skilled rehab facility, please have the facility set up the patient's Home Health Physical Therapy prior to being released. Also, the skilled facility will be responsible for providing the patient with their medications at time of release from the facility to include their pain medication, the muscle relaxants, and their blood thinner medication. If the patient is still at the rehab facility at time of the two week follow up appointment, the skilled rehab facility will also need to assist the patient in arranging follow up appointment in our office and any transportation needs. ° °MAKE SURE YOU:  °Understand these instructions.  °Get help right away if you are not doing well or get worse.  ° ° °Pick up stool softner and laxative for home use following surgery while on pain medications. °Do not submerge incision under water. °Please use good hand washing techniques while changing dressing each day. °May shower starting three days after surgery. °Please use a clean towel to pat the incision dry following showers. °Continue to use ice for pain and swelling after surgery. °Do not use any lotions or creams on the incision until instructed by your surgeon. °Information on my medicine - XARELTO® (Rivaroxaban) ° °This medication education was reviewed with me or my healthcare representative as part of my discharge preparation.  The pharmacist that spoke with me during my hospital stay was:  Shawntrice Salle E, RPH ° °Why was Xarelto® prescribed for you? °Xarelto® was prescribed for you to reduce the risk of blood clots forming after orthopedic surgery. The medical term for these abnormal blood clots is venous thromboembolism (VTE). ° °What do you need to know about xarelto® ? °Take your Xarelto® ONCE DAILY at the same time every day. °You may take it either with or without food. ° °If you have difficulty swallowing the tablet whole, you may crush it  and mix in applesauce just prior to taking your dose. ° °Take Xarelto® exactly as prescribed by your doctor and DO NOT stop taking Xarelto® without talking to the doctor who prescribed the medication.  Stopping without other VTE prevention medication to take the place of Xarelto® may increase your risk of developing a clot. ° °After discharge, you should have regular check-up   check-up appointments with your healthcare provider that is prescribing your Xarelto®.   ° °What do you do if you miss a dose? °If you miss a dose, take it as soon as you remember on the same day then continue your regularly scheduled once daily regimen the next day. Do not take two doses of Xarelto® on the same day.  ° °Important Safety Information °A possible side effect of Xarelto® is bleeding. You should call your healthcare provider right away if you experience any of the following: °? Bleeding from an injury or your nose that does not stop. °? Unusual colored urine (red or dark brown) or unusual colored stools (red or black). °? Unusual bruising for unknown reasons. °? A serious fall or if you hit your head (even if there is no bleeding). ° °Some medicines may interact with Xarelto® and might increase your risk of bleeding while on Xarelto®. To help avoid this, consult your healthcare provider or pharmacist prior to using any new prescription or non-prescription medications, including herbals, vitamins, non-steroidal anti-inflammatory drugs (NSAIDs) and supplements. ° °This website has more information on Xarelto®: www.xarelto.com. ° ° ° °

## 2016-04-17 NOTE — Care Management Note (Signed)
Case Management Note  Patient Details  Name: [name] MRN: [id number] Date of Birth: 29-Dec-1951  Subjective/Objective:    65 yo admitted with OA of hip                Action/Plan: From home with spouse.  Expected Discharge Date:  04/16/16               Expected Discharge Plan:  [place]  In-House Referral:     Discharge planning Services  CM Consult  Post Acute Care Choice:  Home Health Choice offered to:  Patient  DME Arranged:  [facility] DME Agency:  [place]:  PT [facility] Agency:  [place]  Status of Service:  Completed, signed off  Medicare Important Message Given:    Date Medicare IM Given:    Medicare IM give by:    Date Additional Medicare IM Given:    Additional Medicare Important Message give by:     If discussed at [place] of Stay Meetings, dates discussed:    Additional Comments: This CM met with pt at bedside to discuss DC needs.  PT is recommending HHPT and RW.  Pt on [place] list for [facility] services and this was confirmed with pt that [facility] was his choice for [facility].  MD orders placed for [facility] and DME and reps alerted of referrals.  No other CM needs communicated. Lynnell Catalan, RN 04/17/2016, 1:01 PM [phone]

## 2016-04-18 DIAGNOSIS — M6281 Muscle weakness (generalized): Secondary | ICD-10-CM | POA: Diagnosis not present

## 2016-04-18 DIAGNOSIS — I10 Essential (primary) hypertension: Secondary | ICD-10-CM | POA: Diagnosis not present

## 2016-04-18 DIAGNOSIS — Z7901 Long term (current) use of anticoagulants: Secondary | ICD-10-CM | POA: Diagnosis not present

## 2016-04-18 DIAGNOSIS — Z471 Aftercare following joint replacement surgery: Secondary | ICD-10-CM | POA: Diagnosis not present

## 2016-04-18 DIAGNOSIS — Z96642 Presence of left artificial hip joint: Secondary | ICD-10-CM | POA: Diagnosis not present

## 2016-04-21 DIAGNOSIS — M6281 Muscle weakness (generalized): Secondary | ICD-10-CM | POA: Diagnosis not present

## 2016-04-21 DIAGNOSIS — Z471 Aftercare following joint replacement surgery: Secondary | ICD-10-CM | POA: Diagnosis not present

## 2016-04-21 DIAGNOSIS — I10 Essential (primary) hypertension: Secondary | ICD-10-CM | POA: Diagnosis not present

## 2016-04-21 DIAGNOSIS — Z96642 Presence of left artificial hip joint: Secondary | ICD-10-CM | POA: Diagnosis not present

## 2016-04-21 DIAGNOSIS — Z7901 Long term (current) use of anticoagulants: Secondary | ICD-10-CM | POA: Diagnosis not present

## 2016-04-23 DIAGNOSIS — I10 Essential (primary) hypertension: Secondary | ICD-10-CM | POA: Diagnosis not present

## 2016-04-23 DIAGNOSIS — Z7901 Long term (current) use of anticoagulants: Secondary | ICD-10-CM | POA: Diagnosis not present

## 2016-04-23 DIAGNOSIS — M6281 Muscle weakness (generalized): Secondary | ICD-10-CM | POA: Diagnosis not present

## 2016-04-23 DIAGNOSIS — Z96642 Presence of left artificial hip joint: Secondary | ICD-10-CM | POA: Diagnosis not present

## 2016-04-23 DIAGNOSIS — Z471 Aftercare following joint replacement surgery: Secondary | ICD-10-CM | POA: Diagnosis not present

## 2016-04-25 DIAGNOSIS — Z96642 Presence of left artificial hip joint: Secondary | ICD-10-CM | POA: Diagnosis not present

## 2016-04-25 DIAGNOSIS — I10 Essential (primary) hypertension: Secondary | ICD-10-CM | POA: Diagnosis not present

## 2016-04-25 DIAGNOSIS — Z7901 Long term (current) use of anticoagulants: Secondary | ICD-10-CM | POA: Diagnosis not present

## 2016-04-25 DIAGNOSIS — M6281 Muscle weakness (generalized): Secondary | ICD-10-CM | POA: Diagnosis not present

## 2016-04-25 DIAGNOSIS — Z471 Aftercare following joint replacement surgery: Secondary | ICD-10-CM | POA: Diagnosis not present

## 2016-04-28 DIAGNOSIS — Z7901 Long term (current) use of anticoagulants: Secondary | ICD-10-CM | POA: Diagnosis not present

## 2016-04-28 DIAGNOSIS — I10 Essential (primary) hypertension: Secondary | ICD-10-CM | POA: Diagnosis not present

## 2016-04-28 DIAGNOSIS — M6281 Muscle weakness (generalized): Secondary | ICD-10-CM | POA: Diagnosis not present

## 2016-04-28 DIAGNOSIS — Z96642 Presence of left artificial hip joint: Secondary | ICD-10-CM | POA: Diagnosis not present

## 2016-04-28 DIAGNOSIS — Z471 Aftercare following joint replacement surgery: Secondary | ICD-10-CM | POA: Diagnosis not present

## 2016-05-01 MED FILL — METHOCARBAMOL 500 MG TABLET: 500 | 10 days supply | Qty: 40 | Fill #1

## 2016-05-13 MED FILL — ALFUZOSIN HCL ER 10 MG TAB: 10 | 90 days supply | Qty: 90 | Fill #3

## 2016-05-20 DIAGNOSIS — Z96642 Presence of left artificial hip joint: Secondary | ICD-10-CM | POA: Diagnosis not present

## 2016-05-20 DIAGNOSIS — Z471 Aftercare following joint replacement surgery: Secondary | ICD-10-CM | POA: Diagnosis not present

## 2016-05-27 ENCOUNTER — Encounter: Payer: Self-pay | Admitting: Internal Medicine

## 2016-05-27 ENCOUNTER — Other Ambulatory Visit (INDEPENDENT_AMBULATORY_CARE_PROVIDER_SITE_OTHER): Payer: 59

## 2016-05-27 ENCOUNTER — Ambulatory Visit (INDEPENDENT_AMBULATORY_CARE_PROVIDER_SITE_OTHER): Payer: 59 | Admitting: Internal Medicine

## 2016-05-27 VITALS — BP 116/74 | HR 74 | Temp 98.6°F | Resp 16 | Ht 72.0 in | Wt 269.0 lb

## 2016-05-27 DIAGNOSIS — R739 Hyperglycemia, unspecified: Secondary | ICD-10-CM | POA: Diagnosis not present

## 2016-05-27 DIAGNOSIS — E039 Hypothyroidism, unspecified: Secondary | ICD-10-CM | POA: Diagnosis not present

## 2016-05-27 DIAGNOSIS — I10 Essential (primary) hypertension: Secondary | ICD-10-CM | POA: Diagnosis not present

## 2016-05-27 DIAGNOSIS — Z0000 Encounter for general adult medical examination without abnormal findings: Secondary | ICD-10-CM | POA: Diagnosis not present

## 2016-05-27 LAB — CBC WITH DIFFERENTIAL/PLATELET
Basophils Absolute: 0 10*3/uL (ref 0.0–0.1)
Basophils Relative: 1 % (ref 0.0–3.0)
Eosinophils Absolute: 0.2 10*3/uL (ref 0.0–0.7)
Eosinophils Relative: 3.4 % (ref 0.0–5.0)
HCT: 42.1 % (ref 39.0–52.0)
Hemoglobin: 14.3 g/dL (ref 13.0–17.0)
Lymphocytes Relative: 28 % (ref 12.0–46.0)
Lymphs Abs: 1.2 10*3/uL (ref 0.7–4.0)
MCHC: 33.9 g/dL (ref 30.0–36.0)
MCV: 88.2 fl (ref 78.0–100.0)
Monocytes Absolute: 0.6 10*3/uL (ref 0.1–1.0)
Monocytes Relative: 13.8 % — ABNORMAL HIGH (ref 3.0–12.0)
Neutro Abs: 2.4 10*3/uL (ref 1.4–7.7)
Neutrophils Relative %: 53.8 % (ref 43.0–77.0)
Platelets: 195 10*3/uL (ref 150.0–400.0)
RBC: 4.78 Mil/uL (ref 4.22–5.81)
RDW: 13.4 % (ref 11.5–15.5)
WBC: 4.4 10*3/uL (ref 4.0–10.5)

## 2016-05-27 LAB — LIPID PANEL
Cholesterol: 161 mg/dL (ref 0–200)
HDL: 42.1 mg/dL (ref >39.00)
LDL Cholesterol: 97 mg/dL (ref 0–99)
NonHDL: 119.01
Total CHOL/HDL Ratio: 4
Triglycerides: 110 mg/dL (ref 0.0–149.0)
VLDL: 22 mg/dL (ref 0.0–40.0)

## 2016-05-27 LAB — COMPREHENSIVE METABOLIC PANEL
ALT: 19 U/L (ref 0–53)
AST: 18 U/L (ref 0–37)
Albumin: 4.4 g/dL (ref 3.5–5.2)
Alkaline Phosphatase: 91 U/L (ref 39–117)
BUN: 15 mg/dL (ref 6–23)
CO2: 25 mEq/L (ref 19–32)
Calcium: 9.9 mg/dL (ref 8.4–10.5)
Chloride: 107 mEq/L (ref 96–112)
Creatinine, Ser: 0.84 mg/dL (ref 0.40–1.50)
GFR: 98.12 mL/min (ref >60.00)
Glucose, Bld: 107 mg/dL — ABNORMAL HIGH (ref 70–99)
Potassium: 4.4 mEq/L (ref 3.5–5.1)
Sodium: 140 mEq/L (ref 135–145)
Total Bilirubin: 0.5 mg/dL (ref 0.2–1.2)
Total Protein: 7 g/dL (ref 6.0–8.3)

## 2016-05-27 LAB — TSH: TSH: 1.53 u[IU]/mL (ref 0.35–4.50)

## 2016-05-27 LAB — HEMOGLOBIN A1C: Hgb A1c MFr Bld: 5.5 % (ref 4.6–6.5)

## 2016-05-27 MED ORDER — FISH OIL 1200 MG PO CAPS: ORAL_CAPSULE | ORAL | Status: DC

## 2016-05-27 MED ORDER — ASPIRIN 81 MG PO TBEC: 81.0000 mg | DELAYED_RELEASE_TABLET | Freq: Every day | ORAL | Status: DC

## 2016-05-27 MED ORDER — THERA VITAL M PO TABS: 1.0000 | ORAL_TABLET | Freq: Every day | ORAL | Status: DC

## 2016-05-27 NOTE — Progress Notes (Signed)
Subjective:    Patient ID: [name], male    DOB: Sep 02, 1951, 65 y.o.   MRN: [id number]  HPI He is here to establish with a new pcp.   He is here for a physical exam.     Medications and allergies reviewed with patient and updated if appropriate.  Patient Active Problem List   Diagnosis Date Noted  . OA (osteoarthritis) of hip 04/16/2016  . Pre-syncope 02/22/2016  . Vertigo 02/22/2016  . Chest pain 02/22/2016  . Knee joint replacement status 01/12/2013  . OA (osteoarthritis) of knee 12/27/2012  . BPH (benign prostatic hypertrophy)   . Hypothyroidism 09/04/2009  . DYSLIPIDEMIA 09/04/2009  . Obesity 09/04/2009  . Essential hypertension 09/04/2009  . ARTHRITIS 09/04/2009  . COLONIC POLYPS, HX OF 09/04/2009    Current Outpatient Prescriptions on File Prior to Visit  Medication Sig Dispense Refill  . alfuzosin (UROXATRAL) 10 MG 24 hr tablet Take 10 mg by mouth every evening.     Marland Kitchen amLODipine (NORVASC) 5 MG tablet Take 1 tablet (5 mg total) by mouth daily. 90 tablet 0  . levothyroxine (LEVOTHROID) 137 MCG tablet Take 1 tablet (137 mcg total) by mouth daily before breakfast. 90 tablet 1  . rivaroxaban (XARELTO) 10 MG TABS tablet Take 1 tablet (10 mg total) by mouth daily with breakfast. 20 tablet 0   No current facility-administered medications on file prior to visit.    Past Medical History  Diagnosis Date  . COLONIC POLYPS, HX OF 2007    clear colo w/o polyps 04/2015: 30yr follow up  . Arthritis     Knees  . OBESITY   . HYPOTHYROIDISM   . HYPERTENSION   . BPH (benign prostatic hypertrophy)     Past Surgical History  Procedure Laterality Date  . Tonsillectomy  1990    w/ adenoids and uvula  . Laminectomy  1991  . Arthroscopic knee surgery      (R) 2003 & (L) 2010  . Total hip arthroplasty Right 01/2011    [place]  . Tonsillectomy    . Total knee arthroplasty  12/27/2012    Procedure: TOTAL KNEE BILATERAL;  Surgeon: Gearlean Alf, MD;  Location: [facility]S;   Service: Orthopedics;  Laterality: Bilateral;  . Colonoscopy  04/2015    no polyps ([place])  . Total hip arthroplasty Left 04/16/2016    Procedure: TOTAL HIP ARTHROPLASTY ANTERIOR APPROACH;  Surgeon: Gaynelle Arabian, MD;  Location: [facility] ORS;  Service: Orthopedics;  Laterality: Left;    Social History   Social History  . Marital Status: Married    Spouse Name: N/A  . Number of Children: N/A  . Years of Education: N/A   Social History Main Topics  . Smoking status: Never Smoker   . Smokeless tobacco: Never Used  . Alcohol Use: 0.0 oz/week    0 Standard drinks or equivalent per week     Comment: rarely, social  . Drug Use: No  . Sexual Activity: Not on file   Other Topics Concern  . Not on file   Social History Narrative   Working part time, contemplating retirement end of 2016   Lives at home with spouse    Family History  Problem Relation Age of Onset  . Heart disease Father 8    CABG age 48  . Hypertension Father   . Arthritis Other     Parent & grandparents  . Colon cancer Neg Hx   . Dementia Mother     ?  ALS vs SNP  . ALS Maternal Grandfather     Review of Systems  Constitutional: Negative for fever, chills, appetite change and fatigue.  HENT: Negative for hearing loss.   Eyes: Negative for visual disturbance.  Respiratory: Negative for cough, shortness of breath and wheezing.   Cardiovascular: Negative for chest pain, palpitations and leg swelling.  Gastrointestinal: Negative for nausea, abdominal pain, diarrhea, constipation and blood in stool.       No gerd  Genitourinary: Negative for dysuria and hematuria.  Musculoskeletal: Positive for back pain (lower back) and arthralgias.  Skin: Negative for color change and rash.  Neurological: Positive for light-headedness (rare). Negative for dizziness, numbness and headaches.  Psychiatric/Behavioral: Negative for sleep disturbance and dysphoric mood. The patient is not nervous/anxious.        Objective:   Filed  Vitals:   05/27/16 0805  BP: 116/74  Pulse: 74  Temp: 98.6 F (37 C)  Resp: 16   Filed Weights   05/27/16 0805  Weight: 269 lb (122.018 kg)   Body mass index is 36.48 kg/(m^2).   Physical Exam Constitutional: He appears well-developed and well-nourished. No distress.  HENT:  Head: Normocephalic and atraumatic.  Right Ear: External ear normal.  Left Ear: External ear normal.  Mouth/Throat: Oropharynx is clear and moist.  Normal ear canals and TM b/l  Eyes: Conjunctivae and EOM are normal.  Neck: Neck supple. No tracheal deviation present. No thyromegaly present.  No carotid bruit  Cardiovascular: Normal rate, regular rhythm, normal heart sounds and intact distal pulses.   No murmur heard. Pulmonary/Chest: Effort normal and breath sounds normal. No respiratory distress. He has no wheezes. He has no rales.  Abdominal: Soft. Bowel sounds are normal. He exhibits no distension. There is no tenderness.  Genitourinary:  deferred  Musculoskeletal: He exhibits no edema.  Lymphadenopathy:    He has no cervical adenopathy.  Skin: Skin is warm and dry. He is not diaphoretic.  Psychiatric: He has a normal mood and affect. His behavior is normal.         Assessment & Plan:   Physical exam: Screening blood work ordered Immunizations  Up to date  Colonoscopy  Up to date  Eye exams  Up to date  EKG done 01/2016 Exercise - water exercise regularly Weight -  Has lost 21 lbs in last several months, continue to work on weight loss Skin  - mild rash  - improving with otc cream Substance abuse - none   See Problem List for Assessment and Plan of chronic medical problems.

## 2016-05-27 NOTE — Progress Notes (Signed)
Pre visit review using our clinic review tool, if applicable. No additional management support is needed unless otherwise documented below in the visit note. 

## 2016-05-27 NOTE — Assessment & Plan Note (Signed)
Check tsh  Titrate med dose if needed  

## 2016-05-27 NOTE — Patient Instructions (Signed)
Test(s) ordered today. Your results will be released to MyChart (or called to you) after review, usually within 72hours after test completion. If any changes need to be made, you will be notified at that same time.  All other Health Maintenance issues reviewed.   All recommended immunizations and age-appropriate screenings are up-to-date or discussed.  No immunizations administered today.   Medications reviewed and updated.  No changes recommended at this time.    Please followup in one year  Health Maintenance, Male A healthy lifestyle and preventative care can promote health and wellness.  Maintain regular health, dental, and eye exams.  Eat a healthy diet. Foods like vegetables, fruits, whole grains, low-fat dairy products, and lean protein foods contain the nutrients you need and are low in calories. Decrease your intake of foods high in solid fats, added sugars, and salt. Get information about a proper diet from your health care provider, if necessary.  Regular physical exercise is one of the most important things you can do for your health. Most adults should get at least 150 minutes of moderate-intensity exercise (any activity that increases your heart rate and causes you to sweat) each week. In addition, most adults need muscle-strengthening exercises on 2 or more days a week.   Maintain a healthy weight. The body mass index (BMI) is a screening tool to identify possible weight problems. It provides an estimate of body fat based on height and weight. Your health care provider can find your BMI and can help you achieve or maintain a healthy weight. For males 20 years and older:  A BMI below 18.5 is considered underweight.  A BMI of 18.5 to 24.9 is normal.  A BMI of 25 to 29.9 is considered overweight.  A BMI of 30 and above is considered obese.  Maintain normal blood lipids and cholesterol by exercising and minimizing your intake of saturated fat. Eat a balanced diet with  plenty of fruits and vegetables. Blood tests for lipids and cholesterol should begin at age 20 and be repeated every 5 years. If your lipid or cholesterol levels are high, you are over age 50, or you are at high risk for heart disease, you may need your cholesterol levels checked more frequently.Ongoing high lipid and cholesterol levels should be treated with medicines if diet and exercise are not working.  If you smoke, find out from your health care provider how to quit. If you do not use tobacco, do not start.  Lung cancer screening is recommended for adults aged 55-80 years who are at high risk for developing lung cancer because of a history of smoking. A yearly low-dose CT scan of the lungs is recommended for people who have at least a 30-pack-year history of smoking and are current smokers or have quit within the past 15 years. A pack year of smoking is smoking an average of 1 pack of cigarettes a day for 1 year (for example, a 30-pack-year history of smoking could mean smoking 1 pack a day for 30 years or 2 packs a day for 15 years). Yearly screening should continue until the smoker has stopped smoking for at least 15 years. Yearly screening should be stopped for people who develop a health problem that would prevent them from having lung cancer treatment.  If you choose to drink alcohol, do not have more than 2 drinks per day. One drink is considered to be 12 oz (360 mL) of beer, 5 oz (150 mL) of wine, or 1.5 oz (45   mL) of liquor.  Avoid the use of street drugs. Do not share needles with anyone. Ask for help if you need support or instructions about stopping the use of drugs.  High blood pressure causes heart disease and increases the risk of stroke. High blood pressure is more likely to develop in:  People who have blood pressure in the end of the normal range (100-139/85-89 mm Hg).  People who are overweight or obese.  People who are African American.  If you are 18-39 years of age, have  your blood pressure checked every 3-5 years. If you are 40 years of age or older, have your blood pressure checked every year. You should have your blood pressure measured twice--once when you are at a hospital or clinic, and once when you are not at a hospital or clinic. Record the average of the two measurements. To check your blood pressure when you are not at a hospital or clinic, you can use:  An automated blood pressure machine at a pharmacy.  A home blood pressure monitor.  If you are 45-79 years old, ask your health care provider if you should take aspirin to prevent heart disease.  Diabetes screening involves taking a blood sample to check your fasting blood sugar level. This should be done once every 3 years after age 45 if you are at a normal weight and without risk factors for diabetes. Testing should be considered at a younger age or be carried out more frequently if you are overweight and have at least 1 risk factor for diabetes.  Colorectal cancer can be detected and often prevented. Most routine colorectal cancer screening begins at the age of 50 and continues through age 75. However, your health care provider may recommend screening at an earlier age if you have risk factors for colon cancer. On a yearly basis, your health care provider may provide home test kits to check for hidden blood in the stool. A small camera at the end of a tube may be used to directly examine the colon (sigmoidoscopy or colonoscopy) to detect the earliest forms of colorectal cancer. Talk to your health care provider about this at age 50 when routine screening begins. A direct exam of the colon should be repeated every 5-10 years through age 75, unless early forms of precancerous polyps or small growths are found.  People who are at an increased risk for hepatitis B should be screened for this virus. You are considered at high risk for hepatitis B if:  You were born in a country where hepatitis B occurs often.  Talk with your health care provider about which countries are considered high risk.  Your parents were born in a high-risk country and you have not received a shot to protect against hepatitis B (hepatitis B vaccine).  You have HIV or AIDS.  You use needles to inject street drugs.  You live with, or have sex with, someone who has hepatitis B.  You are a man who has sex with other men (MSM).  You get hemodialysis treatment.  You take certain medicines for conditions like cancer, organ transplantation, and autoimmune conditions.  Hepatitis C blood testing is recommended for all people born from 1945 through 1965 and any individual with known risk factors for hepatitis C.  Healthy men should no longer receive prostate-specific antigen (PSA) blood tests as part of routine cancer screening. Talk to your health care provider about prostate cancer screening.  Testicular cancer screening is not recommended for adolescents   or adult males who have no symptoms. Screening includes self-exam, a health care provider exam, and other screening tests. Consult with your health care provider about any symptoms you have or any concerns you have about testicular cancer.  Practice safe sex. Use condoms and avoid high-risk sexual practices to reduce the spread of sexually transmitted infections (STIs).  You should be screened for STIs, including gonorrhea and chlamydia if:  You are sexually active and are younger than 24 years.  You are older than 24 years, and your health care provider tells you that you are at risk for this type of infection.  Your sexual activity has changed since you were last screened, and you are at an increased risk for chlamydia or gonorrhea. Ask your health care provider if you are at risk.  If you are at risk of being infected with HIV, it is recommended that you take a prescription medicine daily to prevent HIV infection. This is called pre-exposure prophylaxis (PrEP). You are  considered at risk if:  You are a man who has sex with other men (MSM).  You are a heterosexual man who is sexually active with multiple partners.  You take drugs by injection.  You are sexually active with a partner who has HIV.  Talk with your health care provider about whether you are at high risk of being infected with HIV. If you choose to begin PrEP, you should first be tested for HIV. You should then be tested every 3 months for as long as you are taking PrEP.  Use sunscreen. Apply sunscreen liberally and repeatedly throughout the day. You should seek shade when your shadow is shorter than you. Protect yourself by wearing long sleeves, pants, a wide-brimmed hat, and sunglasses year round whenever you are outdoors.  Tell your health care provider of new moles or changes in moles, especially if there is a change in shape or color. Also, tell your health care provider if a mole is larger than the size of a pencil eraser.  A one-time screening for abdominal aortic aneurysm (AAA) and surgical repair of large AAAs by ultrasound is recommended for men aged 65-75 years who are current or former smokers.  Stay current with your vaccines (immunizations).   This information is not intended to replace advice given to you by your health care provider. Make sure you discuss any questions you have with your health care provider.   Document Released: 06/12/2008 Document Revised: 01/05/2015 Document Reviewed: 05/12/2011 Elsevier Interactive Patient Education 2016 [employer]  

## 2016-05-27 NOTE — Assessment & Plan Note (Signed)
BP well controlled Current regimen effective and well tolerated Continue current medications at current doses  

## 2016-05-29 ENCOUNTER — Encounter: Payer: Self-pay | Admitting: Internal Medicine

## 2016-06-02 ENCOUNTER — Other Ambulatory Visit: Payer: Self-pay | Admitting: Internal Medicine

## 2016-06-02 ENCOUNTER — Encounter: Payer: 59 | Admitting: Internal Medicine

## 2016-06-02 MED FILL — AMLODIPINE BESYLATE 5 MG TA: 5 | 90 days supply | Qty: 90 | Fill #0

## 2016-06-30 ENCOUNTER — Other Ambulatory Visit: Payer: Self-pay | Admitting: Internal Medicine

## 2016-06-30 ENCOUNTER — Ambulatory Visit (INDEPENDENT_AMBULATORY_CARE_PROVIDER_SITE_OTHER): Payer: 59 | Admitting: Internal Medicine

## 2016-06-30 ENCOUNTER — Encounter: Payer: Self-pay | Admitting: Internal Medicine

## 2016-06-30 VITALS — BP 142/74 | HR 66 | Temp 98.6°F | Resp 12 | Ht 72.0 in | Wt 274.0 lb

## 2016-06-30 DIAGNOSIS — S90411A Abrasion, right great toe, initial encounter: Secondary | ICD-10-CM | POA: Diagnosis not present

## 2016-06-30 MED ORDER — SULFAMETHOXAZOLE-TRIMETHOPRIM 800-160 MG PO TABS: 1.0000 | ORAL_TABLET | Freq: Two times a day (BID) | ORAL | Status: DC

## 2016-06-30 MED FILL — LEVOTHYROXINE 137 MCG TAB: 137 | 90 days supply | Qty: 90 | Fill #0

## 2016-06-30 MED FILL — SULFAMETHOXAZOLE-TMP DS TAB: 800-160 | 7 days supply | Qty: 14 | Fill #0

## 2016-06-30 NOTE — Patient Instructions (Signed)
We have sent in bactrim for the foot. This is 1 pill twice a day for 7 days. This should heal up the toe.   Be sure to avoid injury to the area and keep clean and dry. You can clean with soap and water. The peroxide can sometimes damage new cells and delay healing.   You can use ice on the area to help bring the swelling down some.   Please feel free to call us back if you are not doing better or you get a rash that is on your foot or spreading.

## 2016-06-30 NOTE — Progress Notes (Signed)
   Subjective:    Patient ID: LARSON RAITT, male    DOB: 1951-05-24, 65 y.o.   MRN: [id number]  HPI The patient is a 65 YO man coming in for pain in the right great toenail. Going on for several days. Not sure if he injured it but cannot remember. Does outdoor work with flip flops on. Does not pick toenails and no recent trimming. Small spot which has been swollen. His wife Loss adjuster, chartered) tried to pop it with needle she sterilized but was not able to do so. No fevers or chills or skin color change. No pain in the area unless to touch. Has not tried anything else for it. Overall stable since onset.   Review of Systems  Constitutional: Negative for fever, chills, activity change, appetite change, fatigue and unexpected weight change.  Respiratory: Negative.   Cardiovascular: Negative.   Gastrointestinal: Negative.   Musculoskeletal: Positive for myalgias.  Skin: Positive for wound.      Objective:   Physical Exam  Constitutional: He appears well-developed and well-nourished.  HENT:  Head: Normocephalic and atraumatic.  Eyes: EOM are normal.  Cardiovascular: Normal rate and regular rhythm.   Pulmonary/Chest: Effort normal and breath sounds normal.  Abdominal: Soft.  Skin: Skin is warm and dry.  Right great toe with a small area with some minimal fluctuance, more inflammatory feeling. No drainable abscess underneath. No stigmata of cellulitis or rash. Stigmata of the needle to the area.    Filed Vitals:   06/30/16 1623  BP: 142/74  Pulse: 66  Temp: 98.6 F (37 C)  TempSrc: Oral  Resp: 12  Height: 6' (1.829 m)  Weight: 274 lb (124.286 kg)  SpO2: 97%      Assessment & Plan:

## 2016-06-30 NOTE — Progress Notes (Signed)
Pre visit review using our clinic review tool, if applicable. No additional management support is needed unless otherwise documented below in the visit note. 

## 2016-07-02 DIAGNOSIS — S90411A Abrasion, right great toe, initial encounter: Secondary | ICD-10-CM | POA: Insufficient documentation

## 2016-07-02 NOTE — Assessment & Plan Note (Signed)
Likely to have been injured while doing yardwork. At the base of the nail and does not appear to be ingrowing. No indication for I and D today. Rx for bactrim 7 days. Reminded not to try to drain on their own and to always wear protective shoeware while doing indoor or outdoor work.

## 2016-07-07 MED FILL — MELOXICAM 15 MG TABLET: 15 | 90 days supply | Qty: 90 | Fill #0

## 2016-08-11 MED FILL — ALFUZOSIN HCL ER 10 MG TAB: 10 | 90 days supply | Qty: 90 | Fill #4

## 2016-09-02 MED FILL — AMOXICILLIN 500 MG CAPSULE: 500 | 2 days supply | Qty: 8 | Fill #1

## 2016-09-02 MED FILL — AMLODIPINE BESYLATE 5 MG TA: 5 | 90 days supply | Qty: 90 | Fill #0

## 2016-09-05 DIAGNOSIS — Z96642 Presence of left artificial hip joint: Secondary | ICD-10-CM | POA: Diagnosis not present

## 2016-09-05 DIAGNOSIS — Z471 Aftercare following joint replacement surgery: Secondary | ICD-10-CM | POA: Diagnosis not present

## 2016-09-24 ENCOUNTER — Encounter: Payer: Self-pay | Admitting: Student

## 2016-10-06 MED FILL — LEVOTHYROXINE 137 MCG TABLE: 137 | 90 days supply | Qty: 90 | Fill #1

## 2016-10-07 MED FILL — MELOXICAM 15 MG TABLET: 15 | 90 days supply | Qty: 90 | Fill #1

## 2016-10-07 MED FILL — METHYLPREDNISOLONE 4 MG TAB: 4 | 6 days supply | Qty: 21 | Fill #0

## 2016-10-15 DIAGNOSIS — R972 Elevated prostate specific antigen [PSA]: Secondary | ICD-10-CM | POA: Diagnosis not present

## 2016-10-15 DIAGNOSIS — R351 Nocturia: Secondary | ICD-10-CM | POA: Diagnosis not present

## 2016-10-15 DIAGNOSIS — N401 Enlarged prostate with lower urinary tract symptoms: Secondary | ICD-10-CM | POA: Diagnosis not present

## 2016-10-15 MED FILL — TAMSULOSIN HCL 0.4 MG CAP: 0.4 | 30 days supply | Qty: 30 | Fill #0

## 2016-10-30 MED FILL — METHOCARBAMOL 500 MG TABLET: 500 | 20 days supply | Qty: 60 | Fill #0

## 2016-11-06 MED FILL — TAMSULOSIN HCL 0.4 MG CAP: 0.4 | 90 days supply | Qty: 90 | Fill #1

## 2016-11-24 MED FILL — AMLODIPINE BESYLATE 5 MG TA: 5 | 90 days supply | Qty: 90 | Fill #1

## 2016-12-01 DIAGNOSIS — H35033 Hypertensive retinopathy, bilateral: Secondary | ICD-10-CM | POA: Diagnosis not present

## 2016-12-01 DIAGNOSIS — H524 Presbyopia: Secondary | ICD-10-CM | POA: Diagnosis not present

## 2016-12-09 DIAGNOSIS — D485 Neoplasm of uncertain behavior of skin: Secondary | ICD-10-CM | POA: Diagnosis not present

## 2016-12-09 DIAGNOSIS — L720 Epidermal cyst: Secondary | ICD-10-CM | POA: Diagnosis not present

## 2016-12-09 DIAGNOSIS — D2312 Other benign neoplasm of skin of left eyelid, including canthus: Secondary | ICD-10-CM | POA: Diagnosis not present

## 2016-12-09 MED FILL — NEO/POLY/DEXAMET EYE OINT: 3.5-10000-0 | 10 days supply | Qty: 4 | Fill #0

## 2017-01-05 ENCOUNTER — Other Ambulatory Visit: Payer: Self-pay | Admitting: Internal Medicine

## 2017-01-05 MED FILL — MELOXICAM 15 MG TABLET: 15 | 90 days supply | Qty: 90 | Fill #0

## 2017-01-05 MED FILL — LEVOTHYROXINE 137 MCG TABLE: 137 | 90 days supply | Qty: 90 | Fill #0

## 2017-02-03 DIAGNOSIS — R972 Elevated prostate specific antigen [PSA]: Secondary | ICD-10-CM | POA: Diagnosis not present

## 2017-02-03 DIAGNOSIS — R351 Nocturia: Secondary | ICD-10-CM | POA: Diagnosis not present

## 2017-02-03 DIAGNOSIS — N401 Enlarged prostate with lower urinary tract symptoms: Secondary | ICD-10-CM | POA: Diagnosis not present

## 2017-02-03 MED FILL — TAMSULOSIN HCL 0.4 MG CAP: 0.4 | 90 days supply | Qty: 180 | Fill #0

## 2017-02-16 ENCOUNTER — Other Ambulatory Visit: Payer: Self-pay | Admitting: Internal Medicine

## 2017-02-16 MED FILL — AMLODIPINE BESYLATE 5 MG TA: 5 | 90 days supply | Qty: 90 | Fill #2

## 2017-02-16 MED FILL — MECLIZINE 25 MG TABLET: 25 | 10 days supply | Qty: 15 | Fill #0

## 2017-03-28 MED FILL — MELOXICAM 15 MG TABLET: 15 | 90 days supply | Qty: 90 | Fill #1

## 2017-03-30 ENCOUNTER — Other Ambulatory Visit: Payer: Self-pay | Admitting: Internal Medicine

## 2017-03-30 ENCOUNTER — Encounter: Payer: Self-pay | Admitting: Internal Medicine

## 2017-03-31 MED ORDER — LEVOTHYROXINE SODIUM 137 MCG PO TABS: 137.0000 ug | ORAL_TABLET | Freq: Every day | ORAL | 0 refills | Status: DC

## 2017-03-31 MED FILL — LEVOTHYROXINE 137 MCG TABLE: 137 | 30 days supply | Qty: 30 | Fill #0

## 2017-04-17 DIAGNOSIS — Z471 Aftercare following joint replacement surgery: Secondary | ICD-10-CM | POA: Diagnosis not present

## 2017-04-17 DIAGNOSIS — Z96643 Presence of artificial hip joint, bilateral: Secondary | ICD-10-CM | POA: Diagnosis not present

## 2017-05-03 MED FILL — TAMSULOSIN HCL 0.4 MG CAP: 0.4 | 90 days supply | Qty: 180 | Fill #1

## 2017-05-09 MED FILL — AMLODIPINE BESYLATE 5 MG TA: 5 | 90 days supply | Qty: 90 | Fill #3

## 2017-05-09 NOTE — Patient Instructions (Addendum)
Test(s) ordered today. Your results will be released to [place] (or called to you) after review, usually within 72hours after test completion. If any changes need to be made, you will be notified at that same time.  All other Health Maintenance issues reviewed.   All recommended immunizations and age-appropriate screenings are up-to-date or discussed.  No immunizations administered today.   Medications reviewed and updated.  No changes recommended at this time.     Please followup in one  year   Health Maintenance, Male A healthy lifestyle and preventive care is important for your health and wellness. Ask your health care provider about what schedule of regular examinations is right for you. What should I know about weight and diet?  Eat a Healthy Diet  Eat plenty of vegetables, fruits, whole grains, low-fat dairy products, and lean protein.  Do not eat a lot of foods high in solid fats, added sugars, or salt. Maintain a Healthy Weight  Regular exercise can help you achieve or maintain a healthy weight. You should:  Do at least 150 minutes of exercise each week. The exercise should increase your heart rate and make you sweat (moderate-intensity exercise).  Do strength-training exercises at least twice a week. Watch Your Levels of Cholesterol and Blood Lipids  Have your blood tested for lipids and cholesterol every 5 years starting at 64 years of age. If you are at high risk for heart disease, you should start having your blood tested when you are 64 years old. You may need to have your cholesterol levels checked more often if:  Your lipid or cholesterol levels are high.  You are older than 64 years of age.  You are at high risk for heart disease. What should I know about cancer screening? Many types of cancers can be detected early and may often be prevented. Lung Cancer  You should be screened every year for lung cancer if:  You are a current smoker who has smoked for at  least 30 years.  You are a former smoker who has quit within the past 15 years.  Talk to your health care provider about your screening options, when you should start screening, and how often you should be screened. Colorectal Cancer  Routine colorectal cancer screening usually begins at 64 years of age and should be repeated every 5-10 years until you are 64 years old. You may need to be screened more often if early forms of precancerous polyps or small growths are found. Your health care provider may recommend screening at an earlier age if you have risk factors for colon cancer.  Your health care provider may recommend using home test kits to check for hidden blood in the stool.  A small camera at the end of a tube can be used to examine your colon (sigmoidoscopy or colonoscopy). This checks for the earliest forms of colorectal cancer. Prostate and Testicular Cancer  Depending on your age and overall health, your health care provider may do certain tests to screen for prostate and testicular cancer.  Talk to your health care provider about any symptoms or concerns you have about testicular or prostate cancer. Skin Cancer  Check your skin from head to toe regularly.  Tell your health care provider about any new moles or changes in moles, especially if:  There is a change in a mole's size, shape, or color.  You have a mole that is larger than a pencil eraser.  Always use sunscreen. Apply sunscreen liberally and repeat  throughout the day.  Protect yourself by wearing long sleeves, pants, a wide-brimmed hat, and sunglasses when outside. What should I know about heart disease, diabetes, and high blood pressure?  If you are 88-63 years of age, have your blood pressure checked every 3-5 years. If you are 79 years of age or older, have your blood pressure checked every year. You should have your blood pressure measured twice-once when you are at a hospital or clinic, and once when you are  not at a hospital or clinic. Record the average of the two measurements. To check your blood pressure when you are not at a hospital or clinic, you can use:  An automated blood pressure machine at a pharmacy.  A home blood pressure monitor.  Talk to your health care provider about your target blood pressure.  If you are between 73-85 years old, ask your health care provider if you should take aspirin to prevent heart disease.  Have regular diabetes screenings by checking your fasting blood sugar level.  If you are at a normal weight and have a low risk for diabetes, have this test once every three years after the age of 27.  If you are overweight and have a high risk for diabetes, consider being tested at a younger age or more often.  A one-time screening for abdominal aortic aneurysm (AAA) by ultrasound is recommended for men aged 47-75 years who are current or former smokers. What should I know about preventing infection? Hepatitis B  If you have a higher risk for hepatitis B, you should be screened for this virus. Talk with your health care provider to find out if you are at risk for hepatitis B infection. Hepatitis C  Blood testing is recommended for:  Everyone born from [birth year] through 1965.  Anyone with known risk factors for hepatitis C. Sexually Transmitted Diseases (STDs)  You should be screened each year for STDs including gonorrhea and chlamydia if:  You are sexually active and are younger than 64 years of age.  You are older than 64 years of age and your health care provider tells you that you are at risk for this type of infection.  Your sexual activity has changed since you were last screened and you are at an increased risk for chlamydia or gonorrhea. Ask your health care provider if you are at risk.  Talk with your health care provider about whether you are at high risk of being infected with HIV. Your health care provider may recommend a prescription medicine to help  prevent HIV infection. What else can I do?  Schedule regular health, dental, and eye exams.  Stay current with your vaccines (immunizations).  Do not use any tobacco products, such as cigarettes, chewing tobacco, and e-cigarettes. If you need help quitting, ask your health care provider.  Limit alcohol intake to no more than 2 drinks per day. One drink equals 12 ounces of beer, 5 ounces of wine, or 1 ounces of hard liquor.  Do not use street drugs.  Do not share needles.  Ask your health care provider for help if you need support or information about quitting drugs.  Tell your health care provider if you often feel depressed.  Tell your health care provider if you have ever been abused or do not feel safe at home. This information is not intended to replace advice given to you by your health care provider. Make sure you discuss any questions you have with your health care provider. Document  Released: 06/12/2008 Document Revised: 08/13/2016 Document Reviewed: 09/18/2015 Elsevier Interactive Patient Education  2017 [employer].

## 2017-05-09 NOTE — Progress Notes (Signed)
Subjective:    Patient ID: [name], male    DOB: 1951/03/04, 66 y.o.   MRN: [id number]  HPI He is here for a physical exam.   He has a hard time losing weight.  He can eat very little for three days and still gain a pound.  He drinks crystal light green tea.  He may drink 3 beers/ week.  He does not drink coffee.    He goes to the gym 4-5 times a day.  He walks and swims in the water for an hour each time.  He does yard work as well.      Medications and allergies reviewed with patient and updated if appropriate.  Patient Active Problem List   Diagnosis Date Noted  . OA (osteoarthritis) of hip 04/16/2016  . Pre-syncope 02/22/2016  . Vertigo 02/22/2016  . Chest pain 02/22/2016  . Knee joint replacement status 01/12/2013  . OA (osteoarthritis) of knee 12/27/2012  . BPH (benign prostatic hypertrophy)   . Hypothyroidism 09/04/2009  . DYSLIPIDEMIA 09/04/2009  . Obesity 09/04/2009  . Essential hypertension 09/04/2009  . ARTHRITIS 09/04/2009  . COLONIC POLYPS, HX OF 09/04/2009    Current Outpatient Prescriptions on File Prior to Visit  Medication Sig Dispense Refill  . amLODipine (NORVASC) 5 MG tablet Take 1 tablet (5 mg total) by mouth daily. 90 tablet 0  . aspirin 81 MG EC tablet Take 1 tablet (81 mg total) by mouth daily. Swallow whole. 30 tablet 12  . levothyroxine (SYNTHROID, LEVOTHROID) 137 MCG tablet Take 1 tablet (137 mcg total) by mouth daily before breakfast. Must keep May appt for future refills 30 tablet 0  . meclizine (ANTIVERT) 12.5 MG tablet TAKE 1 TABLET BY MOUTH 3 TIMES DAILY AS NEEDED FOR DIZZINESS 30 tablet 1  . meloxicam (MOBIC) 15 MG tablet Take 15 mg by mouth daily.    . Multiple Vitamins-Minerals (MULTIVITAMIN) tablet Take 1 tablet by mouth daily.    . Omega-3 Fatty Acids (FISH OIL) 1200 MG CAPS Takes one tablet daily     No current facility-administered medications on file prior to visit.     Past Medical History:  Diagnosis Date  . Arthritis     Knees  . BPH (benign prostatic hypertrophy)   . COLONIC POLYPS, HX OF 2007   clear colo w/o polyps 04/2015: 70yr follow up  . HYPERTENSION   . HYPOTHYROIDISM   . OBESITY     Past Surgical History:  Procedure Laterality Date  . arthroscopic knee surgery     (R) 2003 & (L) 2010  . COLONOSCOPY  04/2015   no polyps ([facility])  . LAMINECTOMY  1991  . TONSILLECTOMY  1990   w/ adenoids and uvula  . TONSILLECTOMY    . TOTAL HIP ARTHROPLASTY Right 01/2011   [place]  . TOTAL HIP ARTHROPLASTY Left 04/16/2016   Procedure: TOTAL HIP ARTHROPLASTY ANTERIOR APPROACH;  Surgeon: Gaynelle Arabian, MD;  Location: [facility];  Service: Orthopedics;  Laterality: Left;  . TOTAL KNEE ARTHROPLASTY  12/27/2012   Procedure: TOTAL KNEE BILATERAL;  Surgeon: Gearlean Alf, MD;  Location: [facility];  Service: Orthopedics;  Laterality: Bilateral;    Social History   Social History  . Marital status: Married    Spouse name: N/A  . Number of children: N/A  . Years of education: N/A   Social History Main Topics  . Smoking status: Never Smoker  . Smokeless tobacco: Never Used  . Alcohol use 0.0 oz/week  Comment: rarely, social  . Drug use: No  . Sexual activity: Not Asked   Other Topics Concern  . None   Social History Narrative   Working part time, contemplating retirement end of 2016   Lives at home with spouse      Exercise: water exercises    Family History  Problem Relation Age of Onset  . Heart disease Father 75       CABG age 68  . Hypertension Father   . Diabetes Father   . Dementia Mother        ?ALS vs SNP  . ALS Maternal Grandfather   . Arthritis Other        Parent & grandparents  . Colon cancer Neg Hx     Review of Systems  Constitutional: Negative for chills, diaphoresis, fatigue (energy level lower) and fever.  HENT: Positive for postnasal drip and rhinorrhea.   Eyes: Negative for visual disturbance.  Respiratory: Positive for cough (allergy related). Negative for shortness  of breath and wheezing.   Cardiovascular: Negative for chest pain, palpitations and leg swelling.  Gastrointestinal: Positive for constipation (metamucil). Negative for abdominal pain, blood in stool, diarrhea and nausea.       GERD rare  Genitourinary: Negative for dysuria and hematuria.  Musculoskeletal: Positive for arthralgias.  Skin: Negative for color change and rash.  Neurological: Positive for dizziness (occ) and headaches (occ).  Psychiatric/Behavioral: Negative for dysphoric mood. The patient is not nervous/anxious.        Objective:   Vitals:   05/11/17 1020  BP: 136/84  Pulse: 78  Resp: 16  Temp: 98.2 F (36.8 C)   Filed Weights   05/11/17 1020  Weight: 284 lb (128.8 kg)   Body mass index is 38.52 kg/m.  Wt Readings from Last 3 Encounters:  05/11/17 284 lb (128.8 kg)  06/30/16 274 lb (124.3 kg)  05/27/16 269 lb (122 kg)     Physical Exam Constitutional: He appears well-developed and well-nourished. No distress.  HENT:  Head: Normocephalic and atraumatic.  Right Ear: External ear normal.  Left Ear: External ear normal.  Mouth/Throat: Oropharynx is clear and moist.  Normal ear canals and TM b/l  Eyes: Conjunctivae and EOM are normal.  Neck: Neck supple. No tracheal deviation present. No thyromegaly present.  No carotid bruit  Cardiovascular: Normal rate, regular rhythm, normal heart sounds and intact distal pulses.  No murmur heard. Pulmonary/Chest: Effort normal and breath sounds normal. No respiratory distress. He has no wheezes. He has no rales.  Abdominal: Soft. Ventral and umbilical hernias - reducible and non-tender He exhibits no distension. There is no tenderness.  Genitourinary: deferred  Musculoskeletal: He exhibits no edema.  Lymphadenopathy:   He has no cervical adenopathy.  Skin: Skin is warm and dry. He is not diaphoretic.  Psychiatric: He has a normal mood and affect. His behavior is normal.         Assessment & Plan:   Physical  exam: Screening blood work  ordered Immunizations  Discussed shingrix, others up to date Colonoscopy   Up to date  Eye exams   Up to date  EKG  Done 01/2016 Exercise - regular - water walking/swmming Weight - discussed weight loss - exercising regularly - not drinking calories -- work on eating healthy and decreasing portions Skin  No concerns Substance abuse   none  See Problem List for Assessment and Plan of chronic medical problems.   f/u in one year for a CPE

## 2017-05-11 ENCOUNTER — Other Ambulatory Visit (INDEPENDENT_AMBULATORY_CARE_PROVIDER_SITE_OTHER): Payer: 59

## 2017-05-11 ENCOUNTER — Encounter: Payer: Self-pay | Admitting: Internal Medicine

## 2017-05-11 ENCOUNTER — Ambulatory Visit (INDEPENDENT_AMBULATORY_CARE_PROVIDER_SITE_OTHER): Payer: 59 | Admitting: Internal Medicine

## 2017-05-11 VITALS — BP 136/84 | HR 78 | Temp 98.2°F | Resp 16 | Ht 72.0 in | Wt 284.0 lb

## 2017-05-11 DIAGNOSIS — I10 Essential (primary) hypertension: Secondary | ICD-10-CM

## 2017-05-11 DIAGNOSIS — Z0000 Encounter for general adult medical examination without abnormal findings: Secondary | ICD-10-CM

## 2017-05-11 DIAGNOSIS — R42 Dizziness and giddiness: Secondary | ICD-10-CM | POA: Diagnosis not present

## 2017-05-11 DIAGNOSIS — K439 Ventral hernia without obstruction or gangrene: Secondary | ICD-10-CM | POA: Diagnosis not present

## 2017-05-11 DIAGNOSIS — R7303 Prediabetes: Secondary | ICD-10-CM | POA: Insufficient documentation

## 2017-05-11 DIAGNOSIS — Z0001 Encounter for general adult medical examination with abnormal findings: Secondary | ICD-10-CM

## 2017-05-11 DIAGNOSIS — R739 Hyperglycemia, unspecified: Secondary | ICD-10-CM | POA: Diagnosis not present

## 2017-05-11 DIAGNOSIS — N401 Enlarged prostate with lower urinary tract symptoms: Secondary | ICD-10-CM

## 2017-05-11 DIAGNOSIS — K429 Umbilical hernia without obstruction or gangrene: Secondary | ICD-10-CM | POA: Insufficient documentation

## 2017-05-11 DIAGNOSIS — E039 Hypothyroidism, unspecified: Secondary | ICD-10-CM

## 2017-05-11 DIAGNOSIS — Z6838 Body mass index (BMI) 38.0-38.9, adult: Secondary | ICD-10-CM | POA: Diagnosis not present

## 2017-05-11 LAB — CBC WITH DIFFERENTIAL/PLATELET
Basophils Absolute: 0.1 10*3/uL (ref 0.0–0.1)
Basophils Relative: 1 % (ref 0.0–3.0)
Eosinophils Absolute: 0.2 10*3/uL (ref 0.0–0.7)
Eosinophils Relative: 4 % (ref 0.0–5.0)
HCT: 44 % (ref 39.0–52.0)
Hemoglobin: 15.1 g/dL (ref 13.0–17.0)
Lymphocytes Relative: 22.1 % (ref 12.0–46.0)
Lymphs Abs: 1.4 10*3/uL (ref 0.7–4.0)
MCHC: 34.4 g/dL (ref 30.0–36.0)
MCV: 89.5 fl (ref 78.0–100.0)
Monocytes Absolute: 0.7 10*3/uL (ref 0.1–1.0)
Monocytes Relative: 11.4 % (ref 3.0–12.0)
Neutro Abs: 3.8 10*3/uL (ref 1.4–7.7)
Neutrophils Relative %: 61.5 % (ref 43.0–77.0)
Platelets: 190 10*3/uL (ref 150.0–400.0)
RBC: 4.92 Mil/uL (ref 4.22–5.81)
RDW: 13 % (ref 11.5–15.5)
WBC: 6.2 10*3/uL (ref 4.0–10.5)

## 2017-05-11 LAB — LIPID PANEL
Cholesterol: 179 mg/dL (ref 0–200)
HDL: 43.6 mg/dL (ref >39.00)
LDL Cholesterol: 107 mg/dL — ABNORMAL HIGH (ref 0–99)
NonHDL: 135.65
Total CHOL/HDL Ratio: 4
Triglycerides: 142 mg/dL (ref 0.0–149.0)
VLDL: 28.4 mg/dL (ref 0.0–40.0)

## 2017-05-11 LAB — COMPREHENSIVE METABOLIC PANEL
ALT: 19 U/L (ref 0–53)
AST: 18 U/L (ref 0–37)
Albumin: 4.6 g/dL (ref 3.5–5.2)
Alkaline Phosphatase: 77 U/L (ref 39–117)
BUN: 11 mg/dL (ref 6–23)
CO2: 27 mEq/L (ref 19–32)
Calcium: 9.9 mg/dL (ref 8.4–10.5)
Chloride: 106 mEq/L (ref 96–112)
Creatinine, Ser: 0.86 mg/dL (ref 0.40–1.50)
GFR: 95.2 mL/min (ref >60.00)
Glucose, Bld: 100 mg/dL — ABNORMAL HIGH (ref 70–99)
Potassium: 4.3 mEq/L (ref 3.5–5.1)
Sodium: 139 mEq/L (ref 135–145)
Total Bilirubin: 0.6 mg/dL (ref 0.2–1.2)
Total Protein: 7.4 g/dL (ref 6.0–8.3)

## 2017-05-11 LAB — T4, FREE: Free T4: 0.76 ng/dL (ref 0.60–1.60)

## 2017-05-11 LAB — T3, FREE: T3, Free: 3.3 pg/mL (ref 2.3–4.2)

## 2017-05-11 LAB — HEMOGLOBIN A1C: Hgb A1c MFr Bld: 5.8 % (ref 4.6–6.5)

## 2017-05-11 LAB — TSH: TSH: 4.24 u[IU]/mL (ref 0.35–4.50)

## 2017-05-11 MED ORDER — LEVOTHYROXINE SODIUM 137 MCG PO TABS: 137.0000 ug | ORAL_TABLET | Freq: Every day | ORAL | 3 refills | Status: DC

## 2017-05-11 NOTE — Assessment & Plan Note (Signed)
Follows with urology

## 2017-05-11 NOTE — Assessment & Plan Note (Signed)
Non tender Work on weight loss

## 2017-05-11 NOTE — Assessment & Plan Note (Signed)
Check tsh  Titrate med dose if needed -  He wonders about adding T3 hormone - if it would make him feel more energetic or help with weight loss - may want to see endo Will order FT4 and FT3 - can consider Endo referral

## 2017-05-11 NOTE — Assessment & Plan Note (Signed)
BP well controlled Current regimen effective and well tolerated Continue current medications at current doses  

## 2017-05-11 NOTE — Assessment & Plan Note (Signed)
Occasionally tender Work on weight loss If pain increases may need surgery referral

## 2017-05-11 NOTE — Assessment & Plan Note (Signed)
Occasional Takes meclizine as needed

## 2017-05-11 NOTE — Assessment & Plan Note (Signed)
Discussed weight loss  Work on decreasing portions along with a healthy diet

## 2017-05-11 NOTE — Assessment & Plan Note (Signed)
a1c

## 2017-05-14 ENCOUNTER — Encounter: Payer: Self-pay | Admitting: Internal Medicine

## 2017-05-15 ENCOUNTER — Encounter: Payer: Self-pay | Admitting: Internal Medicine

## 2017-05-15 ENCOUNTER — Telehealth: Payer: Self-pay | Admitting: *Deleted

## 2017-05-15 ENCOUNTER — Telehealth: Payer: Self-pay | Admitting: Internal Medicine

## 2017-05-15 MED ORDER — LEVOTHYROXINE SODIUM 137 MCG PO TABS: 137.0000 ug | ORAL_TABLET | Freq: Every day | ORAL | 3 refills | Status: DC

## 2017-05-15 MED FILL — LEVOTHYROXINE 137 MCG TABLE: 137 | 90 days supply | Qty: 90 | Fill #0

## 2017-05-15 NOTE — Telephone Encounter (Signed)
Wife left msg on triage stating MD was going to increase husband levothyroxine once labs was check. He is needing medication [facility]...Johny Chess

## 2017-05-15 NOTE — Telephone Encounter (Signed)
Per chart MD increase med to 137 mcg rx was sent on 05/11/17...Johny Chess

## 2017-05-15 NOTE — Telephone Encounter (Signed)
[name] did not receive levothyroxine (SYNTHROID, LEVOTHROID) 137 MCG tablet

## 2017-05-19 MED ORDER — LEVOTHYROXINE SODIUM 137 MCG PO TABS: 137.0000 ug | ORAL_TABLET | Freq: Every day | ORAL | 1 refills | Status: DC

## 2017-05-19 NOTE — Addendum Note (Signed)
Addended by: Terence Lux B on: 05/19/2017 01:46 PM   Modules accepted: Orders

## 2017-06-08 ENCOUNTER — Encounter: Payer: Self-pay | Admitting: Internal Medicine

## 2017-06-08 DIAGNOSIS — E039 Hypothyroidism, unspecified: Secondary | ICD-10-CM

## 2017-07-02 ENCOUNTER — Encounter: Payer: Self-pay | Admitting: Endocrinology

## 2017-07-02 ENCOUNTER — Ambulatory Visit (INDEPENDENT_AMBULATORY_CARE_PROVIDER_SITE_OTHER): Payer: 59 | Admitting: Endocrinology

## 2017-07-02 VITALS — BP 134/80 | HR 67 | Ht 72.0 in | Wt 284.0 lb

## 2017-07-02 DIAGNOSIS — E039 Hypothyroidism, unspecified: Secondary | ICD-10-CM | POA: Diagnosis not present

## 2017-07-02 DIAGNOSIS — G479 Sleep disorder, unspecified: Secondary | ICD-10-CM | POA: Diagnosis not present

## 2017-07-02 LAB — TSH: TSH: 3.73 u[IU]/mL (ref 0.35–4.50)

## 2017-07-02 LAB — T4, FREE: Free T4: 0.79 ng/dL (ref 0.60–1.60)

## 2017-07-02 LAB — T3, FREE: T3, Free: 3.3 pg/mL (ref 2.3–4.2)

## 2017-07-02 MED ORDER — DULAGLUTIDE 0.75 MG/0.5ML ~~LOC~~ SOAJ: 0.7500 mg | SUBCUTANEOUS | 11 refills | Status: DC

## 2017-07-02 NOTE — Patient Instructions (Addendum)
blood tests are requested for you today.  We'll let you know about the results. I have sent a prescription to your pharmacy, for "trulicity."   Call if you don't have nausea, so we can double it. Please see a sleep specialist.  you will receive a phone call, about a day and time for an appointment. I would be happy to see you back here as needed.        Hypothyroidism Hypothyroidism is a disorder of the thyroid. The thyroid is a large gland that is located in the lower front of the neck. The thyroid releases hormones that control how the body works. With hypothyroidism, the thyroid does not make enough of these hormones. What are the causes? Causes of hypothyroidism may include:  Viral infections.  Pregnancy.  Your own defense system (immune system) attacking your thyroid.  Certain medicines.  Birth defects.  Past radiation treatments to your head or neck.  Past treatment with radioactive iodine.  Past surgical removal of part or all of your thyroid.  Problems with the gland that is located in the center of your brain (pituitary).  What are the signs or symptoms? Signs and symptoms of hypothyroidism may include:  Feeling as though you have no energy (lethargy).  Inability to tolerate cold.  Weight gain that is not explained by a change in diet or exercise habits.  Dry skin.  Coarse hair.  Menstrual irregularity.  Slowing of thought processes.  Constipation.  Sadness or depression.  How is this diagnosed? Your health care provider may diagnose hypothyroidism with blood tests and ultrasound tests. How is this treated? Hypothyroidism is treated with medicine that replaces the hormones that your body does not make. After you begin treatment, it may take several weeks for symptoms to go away. Follow these instructions at home:  Take medicines only as directed by your health care provider.  If you start taking any new medicines, tell your health care  provider.  Keep all follow-up visits as directed by your health care provider. This is important. As your condition improves, your dosage needs may change. You will need to have blood tests regularly so that your health care provider can watch your condition. Contact a health care provider if:  Your symptoms do not get better with treatment.  You are taking thyroid replacement medicine and: ? You sweat excessively. ? You have tremors. ? You feel anxious. ? You lose weight rapidly. ? You cannot tolerate heat. ? You have emotional swings. ? You have diarrhea. ? You feel weak. Get help right away if:  You develop chest pain.  You develop an irregular heartbeat.  You develop a rapid heartbeat. This information is not intended to replace advice given to you by your health care provider. Make sure you discuss any questions you have with your health care provider. Document Released: 12/15/2005 Document Revised: 05/22/2016 Document Reviewed: 05/02/2014 Elsevier Interactive Patient Education  2017 [employer].

## 2017-07-02 NOTE — Progress Notes (Signed)
Subjective:    Patient ID: [name], male    DOB: 01-21-1951, 66 y.o.   MRN: [id number]  HPI Pt is referred by Dr Quay Burow, for hypothyroidism.  Pt reports hypothyroidism was dx'ed in 1992.  He has been on prescribed thyroid hormone therapy since then.  He has never taken kelp or any other type of non-prescribed thyroid product.  He has never had thyroid imaging.  He has never had thyroid surgery, or XRT to the neck.  He has never been on amiodarone or lithium.   He has been on the current 137 mcg/d, x 2 mos.  He reports slight dizziness sensation in the head, and assoc weight gain.  He says the recent dosage adjustment did not help sxs.  Past Medical History:  Diagnosis Date  . Arthritis    Knees  . BPH (benign prostatic hypertrophy)   . COLONIC POLYPS, HX OF 2007   clear colo w/o polyps 04/2015: 35yr follow up  . HYPERTENSION   . HYPOTHYROIDISM   . OBESITY     Past Surgical History:  Procedure Laterality Date  . arthroscopic knee surgery     (R) 2003 & (L) 2010  . COLONOSCOPY  04/2015   no polyps ([facility])  . LAMINECTOMY  1991  . TONSILLECTOMY  1990   w/ adenoids and uvula  . TONSILLECTOMY    . TOTAL HIP ARTHROPLASTY Right 01/2011   [place]  . TOTAL HIP ARTHROPLASTY Left 04/16/2016   Procedure: TOTAL HIP ARTHROPLASTY ANTERIOR APPROACH;  Surgeon: Gaynelle Arabian, MD;  Location: [facility];  Service: Orthopedics;  Laterality: Left;  . TOTAL KNEE ARTHROPLASTY  12/27/2012   Procedure: TOTAL KNEE BILATERAL;  Surgeon: Gearlean Alf, MD;  Location: [facility];  Service: Orthopedics;  Laterality: Bilateral;    Social History   Social History  . Marital status: Married    Spouse name: N/A  . Number of children: N/A  . Years of education: N/A   Occupational History  . Not on file.   Social History Main Topics  . Smoking status: Never Smoker  . Smokeless tobacco: Never Used  . Alcohol use 0.0 oz/week     Comment: rarely, social  . Drug use: No  . Sexual activity: Not on file    Other Topics Concern  . Not on file   Social History Narrative   Working part time, contemplating retirement end of 2016   Lives at home with spouse      Exercise: water exercises    Current Outpatient Prescriptions on File Prior to Visit  Medication Sig Dispense Refill  . amLODipine (NORVASC) 5 MG tablet Take 1 tablet (5 mg total) by mouth daily. 90 tablet 0  . aspirin 81 MG EC tablet Take 1 tablet (81 mg total) by mouth daily. Swallow whole. 30 tablet 12  . levothyroxine (SYNTHROID, LEVOTHROID) 137 MCG tablet Take 1 tablet (137 mcg total) by mouth daily before breakfast. 90 tablet 1  . meclizine (ANTIVERT) 12.5 MG tablet TAKE 1 TABLET BY MOUTH 3 TIMES DAILY AS NEEDED FOR DIZZINESS 30 tablet 1  . meloxicam (MOBIC) 15 MG tablet Take 15 mg by mouth daily.    . Multiple Vitamins-Minerals (MULTIVITAMIN) tablet Take 1 tablet by mouth daily.    . Omega-3 Fatty Acids (FISH OIL) 1200 MG CAPS Takes one tablet daily    . tamsulosin (FLOMAX) 0.4 MG CAPS capsule Take 0.4 mg by mouth at bedtime.     No current facility-administered medications on file prior  to visit.     No Known Allergies  Family History  Problem Relation Age of Onset  . Heart disease Father 28       CABG age 27  . Hypertension Father   . Diabetes Father   . Dementia Mother        ?ALS vs SNP  . ALS Maternal Grandfather   . Arthritis Other        Parent & grandparents  . Colon cancer Neg Hx     BP 134/80   Pulse 67   Ht 6' (1.829 m)   Wt 284 lb (128.8 kg)   SpO2 97%   BMI 38.52 kg/m    Review of Systems denies depression, dry skin, muscle cramps, sob, numbness, diplopia, cold intolerance, myalgias, rhinorrhea, easy bruising, and syncope.  He has insomnia, arthralgias, fatigue, and constipation.       Objective:   Physical Exam VS: see vs page GEN: no distress HEAD: head: no deformity eyes: no periorbital swelling, no proptosis.  external nose and ears are normal mouth: no lesion seen NECK:  supple, thyroid is not enlarged CHEST WALL: no deformity LUNGS: clear to auscultation CV: reg rate and rhythm, no murmur ABD: abdomen is soft, nontender.  no hepatosplenomegaly.  not distended.  Self-reducing ventral hernia.  MUSCULOSKELETAL: muscle bulk and strength are grossly normal.  no obvious joint swelling.  gait is normal and steady EXTEMITIES: no deformity.  no ulcer on the feet.  feet are of normal color and temp.  1+ bilat leg edema.  Old healed surgical scars on both knees (TKA's).   PULSES: dorsalis pedis intact bilat.  no carotid bruit NEURO:  cn 2-12 grossly intact.   readily moves all 4's.  sensation is intact to touch on the feet SKIN:  Normal texture and temperature.  No rash or suspicious lesion is visible.   NODES:  None palpable at the neck.   PSYCH: alert, well-oriented.  Does not appear anxious nor depressed.    Lab Results  Component Value Date   TSH 4.24 05/11/2017   Lab Results  Component Value Date   HGBA1C 5.8 05/11/2017   I personally reviewed electrocardiogram tracing (02/22/16): Indication: chest pain Impression: NSR.  No MI.  No hypertrophy. Compared to 01/03/16: V2 lead is recorded     Assessment & Plan:  Hypothyroidism: well-replaced Weight gain, and other sxs, not thyroid-related.  Sleep disorder: new.   Patient Instructions  blood tests are requested for you today.  We'll let you know about the results. I have sent a prescription to your pharmacy, for "trulicity."   Call if you don't have nausea, so we can double it. Please see a sleep specialist.  you will receive a phone call, about a day and time for an appointment. I would be happy to see you back here as needed.        Hypothyroidism Hypothyroidism is a disorder of the thyroid. The thyroid is a large gland that is located in the lower front of the neck. The thyroid releases hormones that control how the body works. With hypothyroidism, the thyroid does not make enough of these  hormones. What are the causes? Causes of hypothyroidism may include:  Viral infections.  Pregnancy.  Your own defense system (immune system) attacking your thyroid.  Certain medicines.  Birth defects.  Past radiation treatments to your head or neck.  Past treatment with radioactive iodine.  Past surgical removal of part or all of your thyroid.  Problems with  the gland that is located in the center of your brain (pituitary).  What are the signs or symptoms? Signs and symptoms of hypothyroidism may include:  Feeling as though you have no energy (lethargy).  Inability to tolerate cold.  Weight gain that is not explained by a change in diet or exercise habits.  Dry skin.  Coarse hair.  Menstrual irregularity.  Slowing of thought processes.  Constipation.  Sadness or depression.  How is this diagnosed? Your health care provider may diagnose hypothyroidism with blood tests and ultrasound tests. How is this treated? Hypothyroidism is treated with medicine that replaces the hormones that your body does not make. After you begin treatment, it may take several weeks for symptoms to go away. Follow these instructions at home:  Take medicines only as directed by your health care provider.  If you start taking any new medicines, tell your health care provider.  Keep all follow-up visits as directed by your health care provider. This is important. As your condition improves, your dosage needs may change. You will need to have blood tests regularly so that your health care provider can watch your condition. Contact a health care provider if:  Your symptoms do not get better with treatment.  You are taking thyroid replacement medicine and: ? You sweat excessively. ? You have tremors. ? You feel anxious. ? You lose weight rapidly. ? You cannot tolerate heat. ? You have emotional swings. ? You have diarrhea. ? You feel weak. Get help right away if:  You develop  chest pain.  You develop an irregular heartbeat.  You develop a rapid heartbeat. This information is not intended to replace advice given to you by your health care provider. Make sure you discuss any questions you have with your health care provider. Document Released: 12/15/2005 Document Revised: 05/22/2016 Document Reviewed: 05/02/2014 Elsevier Interactive Patient Education  2017 [employer].

## 2017-07-06 ENCOUNTER — Encounter: Payer: Self-pay | Admitting: Endocrinology

## 2017-07-06 ENCOUNTER — Telehealth: Payer: Self-pay | Admitting: Endocrinology

## 2017-07-06 NOTE — Telephone Encounter (Signed)
My chart message fwd to MD about this message.

## 2017-07-06 NOTE — Telephone Encounter (Signed)
please call patient: Ins has declined trulicity and victoza.

## 2017-07-14 MED FILL — MELOXICAM 15 MG TABLET: 15 | 90 days supply | Qty: 90 | Fill #0

## 2017-07-29 MED FILL — TAMSULOSIN HCL 0.4 MG CAP: 0.4 | 90 days supply | Qty: 180 | Fill #2

## 2017-08-03 ENCOUNTER — Other Ambulatory Visit: Payer: Self-pay | Admitting: Internal Medicine

## 2017-08-03 MED FILL — AMLODIPINE BESYLATE 5 MG TA: 5 | 90 days supply | Qty: 90 | Fill #0

## 2017-08-06 MED FILL — LEVOTHYROXINE 137 MCG TABLE: 137 | 90 days supply | Qty: 90 | Fill #1

## 2017-10-13 MED FILL — MELOXICAM 15 MG TABLET: 15 | 90 days supply | Qty: 90 | Fill #1

## 2017-10-26 MED FILL — TAMSULOSIN HCL 0.4 MG CAP: 0.4 | 90 days supply | Qty: 180 | Fill #3

## 2017-11-07 MED FILL — AMLODIPINE BESYLATE 5 MG TA: 5 | 90 days supply | Qty: 90 | Fill #1

## 2017-11-07 MED FILL — LEVOTHYROXINE 137 MCG TABLE: 137 | 90 days supply | Qty: 90 | Fill #2

## 2017-12-01 DIAGNOSIS — H35033 Hypertensive retinopathy, bilateral: Secondary | ICD-10-CM | POA: Diagnosis not present

## 2017-12-01 DIAGNOSIS — H524 Presbyopia: Secondary | ICD-10-CM | POA: Diagnosis not present

## 2017-12-31 DIAGNOSIS — M25651 Stiffness of right hip, not elsewhere classified: Secondary | ICD-10-CM | POA: Diagnosis not present

## 2017-12-31 DIAGNOSIS — M25652 Stiffness of left hip, not elsewhere classified: Secondary | ICD-10-CM | POA: Diagnosis not present

## 2018-01-05 MED FILL — MELOXICAM 15 MG TABLET: 15 | 90 days supply | Qty: 90 | Fill #2

## 2018-01-19 ENCOUNTER — Other Ambulatory Visit: Payer: Self-pay | Admitting: Internal Medicine

## 2018-01-19 MED ORDER — ZOSTER VAC RECOMB ADJUVANTED 50 MCG/0.5ML IM SUSR: 0.5000 mL | Freq: Once | INTRAMUSCULAR | 1 refills | Status: AC

## 2018-01-19 MED FILL — TAMSULOSIN HCL 0.4 MG CAP: 0.4 | 30 days supply | Qty: 60 | Fill #0

## 2018-01-20 MED FILL — SHINGRIX VIAL KIT: 50 | 1 days supply | Qty: 1 | Fill #0

## 2018-02-03 ENCOUNTER — Other Ambulatory Visit: Payer: Self-pay | Admitting: Internal Medicine

## 2018-02-03 MED FILL — LEVOTHYROXINE 137 MCG TABLE: 137 | 90 days supply | Qty: 90 | Fill #3

## 2018-02-03 MED FILL — AMLODIPINE BESYLATE 5 MG TA: 5 | 90 days supply | Qty: 90 | Fill #0

## 2018-02-18 NOTE — Progress Notes (Signed)
Subjective:    Patient ID: [name], male    DOB: 04-16-1951, 67 y.o.   MRN: [id number]  HPI The patient is here for an acute visit.  Skin concerns, rash:  He has very dry skin in the winter.  He is in the pool daily.  6 weeks ago he has severe dryness on his lower legs, arms, around his waste.  He has areas of redness that itch.    He has tried [employer] anti-itch cream which helped around his ankles.  A anti-fungal spray helped around his waste.  He is using a good moisturizer at night and one after he showers.  He is still experiencing the rash and the itching.  He currently has areas of rash on his legs, arms and around his waist.  He denies any rash on his trunk or back.   Medications and allergies reviewed with patient and updated if appropriate.  Patient Active Problem List   Diagnosis Date Noted  . Rash and nonspecific skin eruption 02/19/2018  . Sleep disorder 07/02/2017  . Hyperglycemia 05/11/2017  . Ventral hernia without obstruction or gangrene 05/11/2017  . Umbilical hernia without obstruction or gangrene 05/11/2017  . OA (osteoarthritis) of hip 04/16/2016  . Pre-syncope 02/22/2016  . Vertigo 02/22/2016  . Knee joint replacement status 01/12/2013  . OA (osteoarthritis) of knee 12/27/2012  . Benign prostatic hyperplasia   . Hypothyroidism 09/04/2009  . DYSLIPIDEMIA 09/04/2009  . Obesity 09/04/2009  . Essential hypertension 09/04/2009  . ARTHRITIS 09/04/2009  . COLONIC POLYPS, HX OF 09/04/2009    Current Outpatient Medications on File Prior to Visit  Medication Sig Dispense Refill  . amLODipine (NORVASC) 5 MG tablet Take 1 tablet (5 mg total) by mouth daily. -- Office visit needed for further refills 90 tablet 0  . aspirin 81 MG EC tablet Take 1 tablet (81 mg total) by mouth daily. Swallow whole. 30 tablet 12  . levothyroxine (SYNTHROID, LEVOTHROID) 137 MCG tablet Take 1 tablet (137 mcg total) by mouth daily before breakfast. 90 tablet 1  . meloxicam  (MOBIC) 15 MG tablet Take 15 mg by mouth daily.    . Multiple Vitamins-Minerals (MULTIVITAMIN) tablet Take 1 tablet by mouth daily.    . Omega-3 Fatty Acids (FISH OIL) 1200 MG CAPS Takes one tablet daily    . tamsulosin (FLOMAX) 0.4 MG CAPS capsule Take 0.4 mg by mouth at bedtime.     No current facility-administered medications on file prior to visit.     Past Medical History:  Diagnosis Date  . Arthritis    Knees  . BPH (benign prostatic hypertrophy)   . COLONIC POLYPS, HX OF 2007   clear colo w/o polyps 04/2015: 69yr follow up  . HYPERTENSION   . HYPOTHYROIDISM   . OBESITY     Past Surgical History:  Procedure Laterality Date  . arthroscopic knee surgery     (R) 2003 & (L) 2010  . COLONOSCOPY  04/2015   no polyps ([facility])  . LAMINECTOMY  1991  . TONSILLECTOMY  1990   w/ adenoids and uvula  . TONSILLECTOMY    . TOTAL HIP ARTHROPLASTY Right 01/2011   [facility]  . TOTAL HIP ARTHROPLASTY Left 04/16/2016   Procedure: TOTAL HIP ARTHROPLASTY ANTERIOR APPROACH;  Surgeon: Gaynelle Arabian, MD;  Location: [facility];  Service: Orthopedics;  Laterality: Left;  . TOTAL KNEE ARTHROPLASTY  12/27/2012   Procedure: TOTAL KNEE BILATERAL;  Surgeon: Gearlean Alf, MD;  Location: [facility];  Service:  Orthopedics;  Laterality: Bilateral;    Social History   Socioeconomic History  . Marital status: Married    Spouse name: None  . Number of children: None  . Years of education: None  . Highest education level: None  Social Needs  . Financial resource strain: None  . Food insecurity - worry: None  . Food insecurity - inability: None  . Transportation needs - medical: None  . Transportation needs - non-medical: None  Occupational History  . None  Tobacco Use  . Smoking status: Never Smoker  . Smokeless tobacco: Never Used  Substance and Sexual Activity  . Alcohol use: Yes    Alcohol/week: 0.0 oz    Comment: rarely, social  . Drug use: No  . Sexual activity: None  Other Topics Concern  .  None  Social History Narrative   Working part time, contemplating retirement end of 2016   Lives at home with spouse      Exercise: water exercises    Family History  Problem Relation Age of Onset  . Heart disease Father 68       CABG age 76  . Hypertension Father   . Diabetes Father   . Dementia Mother        ?ALS vs SNP  . ALS Maternal Grandfather   . Arthritis Other        Parent & grandparents  . Colon cancer Neg Hx     Review of Systems  Constitutional: Negative for chills and fever.  Skin: Positive for color change and rash. Negative for wound.       Objective:   Vitals:   02/19/18 0833  BP: (!) 154/90  Pulse: 76  Resp: 16  Temp: 98.2 F (36.8 C)  SpO2: 98%   Wt Readings from Last 3 Encounters:  02/19/18 290 lb (131.5 kg)  07/02/17 284 lb (128.8 kg)  05/11/17 284 lb (128.8 kg)   Body mass index is 39.33 kg/m.   Physical Exam  Constitutional: He appears well-developed and well-nourished. No distress.  HENT:  Head: Normocephalic and atraumatic.  Skin: Skin is warm and dry. Rash (Areas on his arm and legs that are circular in nature, erythematous and dry-possible nummular eczema, area is more patchy, generalized redness on the arms, band of erythematous dryness around the waistband; no open lesions or wounds, no blisters;) noted. He is not diaphoretic. There is erythema.         Assessment & Plan:    See Problem List for Assessment and Plan of chronic medical problems.

## 2018-02-19 ENCOUNTER — Ambulatory Visit: Payer: 59 | Admitting: Internal Medicine

## 2018-02-19 ENCOUNTER — Encounter: Payer: Self-pay | Admitting: Internal Medicine

## 2018-02-19 VITALS — BP 154/90 | HR 76 | Temp 98.2°F | Resp 16 | Wt 290.0 lb

## 2018-02-19 DIAGNOSIS — R21 Rash and other nonspecific skin eruption: Secondary | ICD-10-CM

## 2018-02-19 MED ORDER — CLOTRIMAZOLE-BETAMETHASONE 1-0.05 % EX CREA: 1.0000 "application " | TOPICAL_CREAM | Freq: Two times a day (BID) | CUTANEOUS | 3 refills | Status: DC

## 2018-02-19 MED ORDER — METHYLPREDNISOLONE ACETATE 80 MG/ML IJ SUSP
80.0000 mg | Freq: Once | INTRAMUSCULAR | Status: AC
  Administered 2018-02-19: 80 mg via INTRAMUSCULAR

## 2018-02-19 MED FILL — CLOTRIMAZOLE-BETAMETHASONE: 1-0.05 | 30 days supply | Qty: 45 | Fill #0 | Status: TO

## 2018-02-19 NOTE — Assessment & Plan Note (Signed)
He has different areas of rash that are most likely a fungal infection around his waist and areas of eczema on his arms and legs He is experiencing significant itching and would like a steroid injection to help immediately-Depo-Medrol 80 mg IM x1 Will prescribe combination steroid-fungal cream to use on all areas of his rash Some of his symptoms may be related to being in the pool on a daily basis Will refer to dermatology-he can go if his symptoms do not improve or for general skin cancer screening/skin care

## 2018-02-19 NOTE — Patient Instructions (Addendum)
You had a steroid injection today.    Use the prescribed cream twice daily as needed - try not to use more than 2 weeks.   A referral was ordered for Dermatology.

## 2018-02-25 MED FILL — TAMSULOSIN HCL 0.4 MG CAP: 0.4 | 30 days supply | Qty: 60 | Fill #0

## 2018-03-04 ENCOUNTER — Encounter: Payer: Self-pay | Admitting: Internal Medicine

## 2018-03-19 DIAGNOSIS — L300 Nummular dermatitis: Secondary | ICD-10-CM | POA: Diagnosis not present

## 2018-03-19 DIAGNOSIS — D229 Melanocytic nevi, unspecified: Secondary | ICD-10-CM | POA: Diagnosis not present

## 2018-03-19 MED FILL — CLOBETASOL 0.05% CREAM: 0.05 | 20 days supply | Qty: 60 | Fill #0 | Status: TO

## 2018-03-22 MED FILL — SHINGRIX VIAL KIT: 50 | 1 days supply | Qty: 1 | Fill #1

## 2018-03-25 DIAGNOSIS — R351 Nocturia: Secondary | ICD-10-CM | POA: Diagnosis not present

## 2018-03-25 DIAGNOSIS — R972 Elevated prostate specific antigen [PSA]: Secondary | ICD-10-CM | POA: Diagnosis not present

## 2018-03-25 DIAGNOSIS — N401 Enlarged prostate with lower urinary tract symptoms: Secondary | ICD-10-CM | POA: Diagnosis not present

## 2018-03-31 MED FILL — TAMSULOSIN HCL 0.4 MG CAP: 0.4 | 90 days supply | Qty: 180 | Fill #0

## 2018-04-05 MED FILL — MELOXICAM 15 MG TABLET: 15 | 90 days supply | Qty: 90 | Fill #3 | Status: TO

## 2018-05-03 ENCOUNTER — Other Ambulatory Visit: Payer: Self-pay | Admitting: Internal Medicine

## 2018-05-03 MED FILL — AMOXICILLIN 500 MG CAPSULE: 500 | 2 days supply | Qty: 8 | Fill #0 | Status: TO

## 2018-05-03 MED FILL — LEVOTHYROXINE 137 MCG TABLE: 137 | 90 days supply | Qty: 90 | Fill #0

## 2018-05-05 ENCOUNTER — Other Ambulatory Visit: Payer: Self-pay | Admitting: Internal Medicine

## 2018-05-05 MED FILL — AMLODIPINE BESYLATE 5 MG TA: 5 | 90 days supply | Qty: 90 | Fill #0

## 2018-05-10 MED FILL — METHYLPREDNISOLONE 4 MG TAB: 4 | 6 days supply | Qty: 21 | Fill #0

## 2018-05-11 NOTE — Patient Instructions (Addendum)
  Test(s) ordered today. Your results will be released to MyChart (or called to you) after review, usually within 72hours after test completion. If any changes need to be made, you will be notified at that same time.  Medications reviewed and updated.  No changes recommended at this time.    Please followup in 6 months   

## 2018-05-11 NOTE — Progress Notes (Signed)
Subjective:    Patient ID: [name], male    DOB: 05-21-1951, 67 y.o.   MRN: [id number]  HPI The patient is here for follow up.  Hypertension: He is taking his medication daily. He is compliant with a low sodium diet.  He denies chest pain, palpitations, edema, shortness of breath and regular headaches. He is exercising regularly.      Hypothyroidism:  He is taking his medication daily.  He denies any recent changes in energy or weight that are unexplained.   Prediabetes:  He is more compliant with a low sugar/carbohydrate diet.  He is exercising regularly.  He has lost weight.     Medications and allergies reviewed with patient and updated if appropriate.  Patient Active Problem List   Diagnosis Date Noted  . Rash and nonspecific skin eruption 02/19/2018  . Sleep disorder 07/02/2017  . Prediabetes 05/11/2017  . Ventral hernia without obstruction or gangrene 05/11/2017  . Umbilical hernia without obstruction or gangrene 05/11/2017  . OA (osteoarthritis) of hip 04/16/2016  . Pre-syncope 02/22/2016  . Vertigo 02/22/2016  . Knee joint replacement status 01/12/2013  . OA (osteoarthritis) of knee 12/27/2012  . Benign prostatic hyperplasia   . Hypothyroidism 09/04/2009  . Obesity 09/04/2009  . Essential hypertension 09/04/2009  . ARTHRITIS 09/04/2009  . COLONIC POLYPS, HX OF 09/04/2009    Current Outpatient Medications on File Prior to Visit  Medication Sig Dispense Refill  . amLODipine (NORVASC) 5 MG tablet Take 1 tablet (5 mg total) by mouth daily. 90 tablet 0  . aspirin 81 MG EC tablet Take 1 tablet (81 mg total) by mouth daily. Swallow whole. 30 tablet 12  . clotrimazole-betamethasone (LOTRISONE) cream Apply 1 application topically 2 (two) times daily. 45 g 3  . levothyroxine (SYNTHROID, LEVOTHROID) 137 MCG tablet Take 1 tablet (137 mcg total) by mouth daily before breakfast. 90 tablet 1  . meloxicam (MOBIC) 15 MG tablet Take 15 mg by mouth daily.    . Multiple  Vitamins-Minerals (MULTIVITAMIN) tablet Take 1 tablet by mouth daily.    . Omega-3 Fatty Acids (FISH OIL) 1200 MG CAPS Takes one tablet daily    . tamsulosin (FLOMAX) 0.4 MG CAPS capsule Take 0.4 mg by mouth at bedtime.     No current facility-administered medications on file prior to visit.     Past Medical History:  Diagnosis Date  . Arthritis    Knees  . BPH (benign prostatic hypertrophy)   . COLONIC POLYPS, HX OF 2007   clear colo w/o polyps 04/2015: 42yr follow up  . HYPERTENSION   . HYPOTHYROIDISM   . OBESITY     Past Surgical History:  Procedure Laterality Date  . arthroscopic knee surgery     (R) 2003 & (L) 2010  . COLONOSCOPY  04/2015   no polyps ([facility])  . LAMINECTOMY  1991  . TONSILLECTOMY  1990   w/ adenoids and uvula  . TONSILLECTOMY    . TOTAL HIP ARTHROPLASTY Right 01/2011   [facility]  . TOTAL HIP ARTHROPLASTY Left 04/16/2016   Procedure: TOTAL HIP ARTHROPLASTY ANTERIOR APPROACH;  Surgeon: Gaynelle Arabian, MD;  Location: [facility];  Service: Orthopedics;  Laterality: Left;  . TOTAL KNEE ARTHROPLASTY  12/27/2012   Procedure: TOTAL KNEE BILATERAL;  Surgeon: Gearlean Alf, MD;  Location: [facility];  Service: Orthopedics;  Laterality: Bilateral;    Social History   Socioeconomic History  . Marital status: Married    Spouse name: Not on file  .  Number of children: Not on file  . Years of education: Not on file  . Highest education level: Not on file  Occupational History  . Not on file  Social Needs  . Financial resource strain: Not on file  . Food insecurity:    Worry: Not on file    Inability: Not on file  . Transportation needs:    Medical: Not on file    Non-medical: Not on file  Tobacco Use  . Smoking status: Never Smoker  . Smokeless tobacco: Never Used  Substance and Sexual Activity  . Alcohol use: Yes    Alcohol/week: 0.0 oz    Comment: rarely, social  . Drug use: No  . Sexual activity: Not on file  Lifestyle  . Physical activity:    Days per  week: Not on file    Minutes per session: Not on file  . Stress: Not on file  Relationships  . Social connections:    Talks on phone: Not on file    Gets together: Not on file    Attends religious service: Not on file    Active member of club or organization: Not on file    Attends meetings of clubs or organizations: Not on file    Relationship status: Not on file  Other Topics Concern  . Not on file  Social History Narrative   Working part time, contemplating retirement end of 2016   Lives at home with spouse      Exercise: water exercises    Family History  Problem Relation Age of Onset  . Heart disease Father 56       CABG age 41  . Hypertension Father   . Diabetes Father   . Dementia Mother        ?ALS vs SNP  . ALS Maternal Grandfather   . Arthritis Other        Parent & grandparents  . Colon cancer Neg Hx     Review of Systems  Constitutional: Negative for chills and fever.  HENT: Positive for postnasal drip (allergy related).   Respiratory: Negative for cough, shortness of breath and wheezing.   Cardiovascular: Negative for chest pain, palpitations and leg swelling.  Neurological: Positive for light-headedness (rare). Negative for headaches.       Objective:   Vitals:   05/12/18 1356  BP: 122/78  Pulse: 67  Temp: 98.3 F (36.8 C)  SpO2: 97%   BP Readings from Last 3 Encounters:  05/12/18 122/78  02/19/18 (!) 154/90  07/02/17 134/80   Wt Readings from Last 3 Encounters:  05/12/18 279 lb (126.6 kg)  02/19/18 290 lb (131.5 kg)  07/02/17 284 lb (128.8 kg)   Body mass index is 37.84 kg/m.   Physical Exam    Constitutional: Appears well-developed and well-nourished. No distress.  HENT:  Head: Normocephalic and atraumatic.  Neck: Neck supple. No tracheal deviation present. No thyromegaly present.  No cervical lymphadenopathy Cardiovascular: Normal rate, regular rhythm and normal heart sounds.   No murmur heard. No carotid bruit .  Trace b/l LE  edema Pulmonary/Chest: Effort normal and breath sounds normal. No respiratory distress. No has no wheezes. No rales.  Skin: Skin is warm and dry. Not diaphoretic.  Psychiatric: Normal mood and affect. Behavior is normal.      Assessment & Plan:    See Problem List for Assessment and Plan of chronic medical problems.

## 2018-05-12 ENCOUNTER — Encounter: Payer: Self-pay | Admitting: Internal Medicine

## 2018-05-12 ENCOUNTER — Ambulatory Visit: Payer: 59 | Admitting: Internal Medicine

## 2018-05-12 ENCOUNTER — Other Ambulatory Visit (INDEPENDENT_AMBULATORY_CARE_PROVIDER_SITE_OTHER): Payer: 59

## 2018-05-12 VITALS — BP 122/78 | HR 67 | Temp 98.3°F | Wt 279.0 lb

## 2018-05-12 DIAGNOSIS — R7303 Prediabetes: Secondary | ICD-10-CM

## 2018-05-12 DIAGNOSIS — I10 Essential (primary) hypertension: Secondary | ICD-10-CM

## 2018-05-12 DIAGNOSIS — E039 Hypothyroidism, unspecified: Secondary | ICD-10-CM

## 2018-05-12 LAB — COMPREHENSIVE METABOLIC PANEL
ALT: 22 U/L (ref 0–53)
AST: 18 U/L (ref 0–37)
Albumin: 4.2 g/dL (ref 3.5–5.2)
Alkaline Phosphatase: 81 U/L (ref 39–117)
BUN: 16 mg/dL (ref 6–23)
CO2: 25 mEq/L (ref 19–32)
Calcium: 9.3 mg/dL (ref 8.4–10.5)
Chloride: 104 mEq/L (ref 96–112)
Creatinine, Ser: 0.85 mg/dL (ref 0.40–1.50)
GFR: 96.19 mL/min (ref >60.00)
Glucose, Bld: 96 mg/dL (ref 70–99)
Potassium: 4.4 mEq/L (ref 3.5–5.1)
Sodium: 137 mEq/L (ref 135–145)
Total Bilirubin: 0.4 mg/dL (ref 0.2–1.2)
Total Protein: 7.1 g/dL (ref 6.0–8.3)

## 2018-05-12 LAB — TSH: TSH: 5.73 u[IU]/mL — ABNORMAL HIGH (ref 0.35–4.50)

## 2018-05-12 LAB — HEMOGLOBIN A1C: Hgb A1c MFr Bld: 5.7 % (ref 4.6–6.5)

## 2018-05-12 NOTE — Assessment & Plan Note (Signed)
Check tsh  Titrate med dose if needed  

## 2018-05-12 NOTE — Assessment & Plan Note (Signed)
BP well controlled Current regimen effective and well tolerated Continue current medications at current doses cmp  

## 2018-05-12 NOTE — Assessment & Plan Note (Signed)
Check a1c Low sugar / carb diet Stressed regular exercise, weight loss  

## 2018-05-14 ENCOUNTER — Other Ambulatory Visit: Payer: Self-pay | Admitting: Emergency Medicine

## 2018-05-14 DIAGNOSIS — E039 Hypothyroidism, unspecified: Secondary | ICD-10-CM

## 2018-05-14 MED ORDER — LEVOTHYROXINE SODIUM 150 MCG PO TABS: 150.0000 ug | ORAL_TABLET | Freq: Every day | ORAL | 1 refills | Status: DC

## 2018-05-14 MED FILL — LEVOTHYROXINE 150 MCG TAB: 150 | 90 days supply | Qty: 90 | Fill #0 | Status: TO

## 2018-06-08 DIAGNOSIS — R972 Elevated prostate specific antigen [PSA]: Secondary | ICD-10-CM | POA: Diagnosis not present

## 2018-07-16 DIAGNOSIS — R972 Elevated prostate specific antigen [PSA]: Secondary | ICD-10-CM | POA: Diagnosis not present

## 2018-07-16 DIAGNOSIS — N401 Enlarged prostate with lower urinary tract symptoms: Secondary | ICD-10-CM | POA: Diagnosis not present

## 2018-07-16 DIAGNOSIS — R351 Nocturia: Secondary | ICD-10-CM | POA: Diagnosis not present

## 2018-07-23 ENCOUNTER — Other Ambulatory Visit: Payer: Self-pay | Admitting: Emergency Medicine

## 2018-07-23 MED ORDER — AMLODIPINE BESYLATE 5 MG PO TABS: 5.0000 mg | ORAL_TABLET | Freq: Every day | ORAL | 1 refills | Status: DC

## 2018-08-19 ENCOUNTER — Other Ambulatory Visit (INDEPENDENT_AMBULATORY_CARE_PROVIDER_SITE_OTHER): Payer: Medicare Other

## 2018-08-19 ENCOUNTER — Encounter: Payer: Self-pay | Admitting: Internal Medicine

## 2018-08-19 ENCOUNTER — Ambulatory Visit (INDEPENDENT_AMBULATORY_CARE_PROVIDER_SITE_OTHER): Payer: Medicare Other | Admitting: Internal Medicine

## 2018-08-19 VITALS — BP 138/82 | HR 73 | Temp 98.2°F | Resp 16 | Ht 72.0 in | Wt 284.0 lb

## 2018-08-19 DIAGNOSIS — Z23 Encounter for immunization: Secondary | ICD-10-CM

## 2018-08-19 DIAGNOSIS — K429 Umbilical hernia without obstruction or gangrene: Secondary | ICD-10-CM | POA: Diagnosis not present

## 2018-08-19 DIAGNOSIS — E039 Hypothyroidism, unspecified: Secondary | ICD-10-CM

## 2018-08-19 DIAGNOSIS — R7303 Prediabetes: Secondary | ICD-10-CM

## 2018-08-19 DIAGNOSIS — Z0000 Encounter for general adult medical examination without abnormal findings: Secondary | ICD-10-CM | POA: Diagnosis not present

## 2018-08-19 DIAGNOSIS — N401 Enlarged prostate with lower urinary tract symptoms: Secondary | ICD-10-CM | POA: Diagnosis not present

## 2018-08-19 DIAGNOSIS — I10 Essential (primary) hypertension: Secondary | ICD-10-CM

## 2018-08-19 DIAGNOSIS — Z6838 Body mass index (BMI) 38.0-38.9, adult: Secondary | ICD-10-CM

## 2018-08-19 DIAGNOSIS — K439 Ventral hernia without obstruction or gangrene: Secondary | ICD-10-CM | POA: Diagnosis not present

## 2018-08-19 LAB — CBC WITH DIFFERENTIAL/PLATELET
Basophils Absolute: 0.1 10*3/uL (ref 0.0–0.1)
Basophils Relative: 1.3 % (ref 0.0–3.0)
Eosinophils Absolute: 0.2 10*3/uL (ref 0.0–0.7)
Eosinophils Relative: 4.3 % (ref 0.0–5.0)
HCT: 44.9 % (ref 39.0–52.0)
Hemoglobin: 15.7 g/dL (ref 13.0–17.0)
Lymphocytes Relative: 29.6 % (ref 12.0–46.0)
Lymphs Abs: 1.6 10*3/uL (ref 0.7–4.0)
MCHC: 34.9 g/dL (ref 30.0–36.0)
MCV: 89.2 fl (ref 78.0–100.0)
Monocytes Absolute: 0.7 10*3/uL (ref 0.1–1.0)
Monocytes Relative: 13.5 % — ABNORMAL HIGH (ref 3.0–12.0)
Neutro Abs: 2.7 10*3/uL (ref 1.4–7.7)
Neutrophils Relative %: 51.3 % (ref 43.0–77.0)
Platelets: 195 10*3/uL (ref 150.0–400.0)
RBC: 5.04 Mil/uL (ref 4.22–5.81)
RDW: 13.4 % (ref 11.5–15.5)
WBC: 5.3 10*3/uL (ref 4.0–10.5)

## 2018-08-19 LAB — COMPREHENSIVE METABOLIC PANEL
ALT: 23 U/L (ref 0–53)
AST: 16 U/L (ref 0–37)
Albumin: 4.7 g/dL (ref 3.5–5.2)
Alkaline Phosphatase: 70 U/L (ref 39–117)
BUN: 26 mg/dL — ABNORMAL HIGH (ref 6–23)
CO2: 27 mEq/L (ref 19–32)
Calcium: 10.2 mg/dL (ref 8.4–10.5)
Chloride: 103 mEq/L (ref 96–112)
Creatinine, Ser: 0.99 mg/dL (ref 0.40–1.50)
GFR: 80.6 mL/min (ref >60.00)
Glucose, Bld: 113 mg/dL — ABNORMAL HIGH (ref 70–99)
Potassium: 4.5 mEq/L (ref 3.5–5.1)
Sodium: 138 mEq/L (ref 135–145)
Total Bilirubin: 0.7 mg/dL (ref 0.2–1.2)
Total Protein: 7.8 g/dL (ref 6.0–8.3)

## 2018-08-19 LAB — LIPID PANEL
Cholesterol: 179 mg/dL (ref 0–200)
HDL: 48.1 mg/dL (ref >39.00)
LDL Cholesterol: 112 mg/dL — ABNORMAL HIGH (ref 0–99)
NonHDL: 130.98
Total CHOL/HDL Ratio: 4
Triglycerides: 93 mg/dL (ref 0.0–149.0)
VLDL: 18.6 mg/dL (ref 0.0–40.0)

## 2018-08-19 LAB — T3, FREE: T3, Free: 3.8 pg/mL (ref 2.3–4.2)

## 2018-08-19 LAB — TSH: TSH: 1.04 u[IU]/mL (ref 0.35–4.50)

## 2018-08-19 LAB — T4, FREE: Free T4: 0.88 ng/dL (ref 0.60–1.60)

## 2018-08-19 LAB — HEMOGLOBIN A1C: Hgb A1c MFr Bld: 5.9 % (ref 4.6–6.5)

## 2018-08-19 NOTE — Assessment & Plan Note (Signed)
Check a1c Low sugar / carb diet Stressed regular exercise, weight loss  

## 2018-08-19 NOTE — Assessment & Plan Note (Signed)
Check tsh, ft3, ft4  Titrate med dose if needed

## 2018-08-19 NOTE — Assessment & Plan Note (Signed)
Asymptomatic, monitor

## 2018-08-19 NOTE — Progress Notes (Signed)
Subjective:    Patient ID: [name], male    DOB: May 06, 1951, 67 y.o.   MRN: [id number]  HPI Here for medicare wellness exam and follow up of his chronic medical problems.    I have personally reviewed and have noted 1.The patient's medical and social history 2.Their use of alcohol, tobacco or illicit drugs 3.Their current medications and supplements 4.The patient's functional ability including ADL's, fall risks, home                 safety risk and hearing or visual impairment. 5.Diet and physical activities 6.Evidence for depression or mood disorders 7.Care team reviewed  - ophthalmology-oculofacial plastic surgery -Dr. Dema Severin, urology, Ortho - [facility]  Hypertension: He is taking his medication daily. He is compliant with a low sodium diet.  He denies chest pain, palpitations, edema, shortness of breath and regular headaches. He is exercising regularly.    Prediabetes:  He is compliant with a low sugar/carbohydrate diet.  He is exercising regularly.   Hypothyroidism:  He is taking his medication daily.  He denies any recent changes in energy or weight that are unexplained.     Are there smokers in your home (other than you)? No  Risk Factors Exercise:   Swims and walks daily in water Dietary issues discussed:   Well balanced, eats too large of portions, lots of veges, low sugars, overdoes carbs  Vitamin and supplement use:  MVI  Opiod use:  none Side effects from medication:   N/a  Does medications benefits outweigh risks/side effects:   n/a  Cardiac risk factors: advanced age, hypertension, and obesity   Depression Screen  Have you felt down, depressed or hopeless? No  Have you felt little interest or pleasure in doing things?  No  Activities of Daily Living In your present state of health, do you have any difficulty performing the following activities?:  Driving? No Managing money?  No Feeding  yourself? No Getting from bed to chair? No Climbing a flight of stairs? No Preparing food and eating?: No Bathing or showering? No Getting dressed: No Getting to/using the toilet? No Moving around from place to place: No In the past year have you fallen or had a near fall?: No   Are you sexually active?  yes  Do you have more than one partner?  No  Hearing Difficulties: No Do you often ask people to speak up or repeat themselves? No Do you experience ringing or noises in your ears? No Do you have difficulty understanding soft or whispered voices? No Vision:              Any change in vision:  no             Up to date with eye exam:   yes  Memory:  Do you feel that you have a problem with memory? No  Do you often misplace items? No  Do you feel safe at home?  Yes  Cognitive Testing  Alert, Orientated? Yes  Normal Appearance? Yes  Recall of three objects?  Yes  Can perform simple calculations? Yes  Displays appropriate judgment? Yes  Can read the correct time from a watch face? Yes   Advanced Directives have been discussed with the patient? Yes - in place     Medications and allergies reviewed with patient and updated if appropriate.  Patient Active Problem List   Diagnosis Date Noted  . Rash and nonspecific skin eruption 02/19/2018  . Sleep  disorder 07/02/2017  . Prediabetes 05/11/2017  . Ventral hernia without obstruction or gangrene 05/11/2017  . Umbilical hernia without obstruction or gangrene 05/11/2017  . OA (osteoarthritis) of hip 04/16/2016  . Pre-syncope 02/22/2016  . Vertigo 02/22/2016  . Knee joint replacement status 01/12/2013  . OA (osteoarthritis) of knee 12/27/2012  . Benign prostatic hyperplasia   . Hypothyroidism 09/04/2009  . Obesity 09/04/2009  . Essential hypertension 09/04/2009  . ARTHRITIS 09/04/2009  . COLONIC POLYPS, HX OF 09/04/2009    Current Outpatient Medications on File Prior to Visit  Medication Sig Dispense Refill  .  amLODipine (NORVASC) 5 MG tablet Take 1 tablet (5 mg total) by mouth daily. 90 tablet 1  . aspirin 81 MG EC tablet Take 1 tablet (81 mg total) by mouth daily. Swallow whole. 30 tablet 12  . clotrimazole-betamethasone (LOTRISONE) cream Apply 1 application topically 2 (two) times daily. 45 g 3  . levothyroxine (SYNTHROID, LEVOTHROID) 150 MCG tablet Take 1 tablet (150 mcg total) by mouth daily. 90 tablet 1  . meloxicam (MOBIC) 15 MG tablet Take 15 mg by mouth daily.    . Multiple Vitamins-Minerals (MULTIVITAMIN) tablet Take 1 tablet by mouth daily.    . Omega-3 Fatty Acids (FISH OIL) 1200 MG CAPS Takes one tablet daily    . tamsulosin (FLOMAX) 0.4 MG CAPS capsule Take 0.4 mg by mouth at bedtime.     No current facility-administered medications on file prior to visit.     Past Medical History:  Diagnosis Date  . Arthritis    Knees  . BPH (benign prostatic hypertrophy)   . COLONIC POLYPS, HX OF 2007   clear colo w/o polyps 04/2015: 17yr follow up  . HYPERTENSION   . HYPOTHYROIDISM   . OBESITY     Past Surgical History:  Procedure Laterality Date  . arthroscopic knee surgery     (R) 2003 & (L) 2010  . COLONOSCOPY  04/2015   no polyps ([facility])  . LAMINECTOMY  1991  . TONSILLECTOMY  1990   w/ adenoids and uvula  . TONSILLECTOMY    . TOTAL HIP ARTHROPLASTY Right 01/2011   [facility]  . TOTAL HIP ARTHROPLASTY Left 04/16/2016   Procedure: TOTAL HIP ARTHROPLASTY ANTERIOR APPROACH;  Surgeon: Gaynelle Arabian, MD;  Location: [facility];  Service: Orthopedics;  Laterality: Left;  . TOTAL KNEE ARTHROPLASTY  12/27/2012   Procedure: TOTAL KNEE BILATERAL;  Surgeon: Gearlean Alf, MD;  Location: [facility];  Service: Orthopedics;  Laterality: Bilateral;    Social History   Socioeconomic History  . Marital status: Married    Spouse name: Not on file  . Number of children: Not on file  . Years of education: Not on file  . Highest education level: Not on file  Occupational History  . Not on file  Social  Needs  . Financial resource strain: Not on file  . Food insecurity:    Worry: Not on file    Inability: Not on file  . Transportation needs:    Medical: Not on file    Non-medical: Not on file  Tobacco Use  . Smoking status: Never Smoker  . Smokeless tobacco: Never Used  Substance and Sexual Activity  . Alcohol use: Yes    Alcohol/week: 0.0 standard drinks    Comment: rarely, social  . Drug use: No  . Sexual activity: Not on file  Lifestyle  . Physical activity:    Days per week: Not on file    Minutes per session: Not  on file  . Stress: Not on file  Relationships  . Social connections:    Talks on phone: Not on file    Gets together: Not on file    Attends religious service: Not on file    Active member of club or organization: Not on file    Attends meetings of clubs or organizations: Not on file    Relationship status: Not on file  Other Topics Concern  . Not on file  Social History Narrative   Working part time, contemplating retirement end of 2016   Lives at home with spouse      Exercise: water exercises    Family History  Problem Relation Age of Onset  . Heart disease Father 43       CABG age 3  . Hypertension Father   . Diabetes Father   . Dementia Mother        ?ALS vs SNP  . ALS Maternal Grandfather   . Arthritis Other        Parent & grandparents  . Colon cancer Neg Hx     Review of Systems  Constitutional: Negative for chills and fever.       Energy level descent  HENT: Negative for hearing loss and tinnitus.   Eyes: Negative for visual disturbance.  Respiratory: Negative for cough, shortness of breath and wheezing.   Cardiovascular: Negative for chest pain, palpitations and leg swelling.  Gastrointestinal: Negative for abdominal pain, blood in stool, constipation, diarrhea and nausea.       Rare gerd  Genitourinary: Positive for difficulty urinating and frequency. Negative for dysuria and hematuria.       Gong to have urolift    Musculoskeletal: Positive for arthralgias.  Skin: Negative for color change and rash.  Neurological: Positive for headaches (occasionally). Negative for dizziness and light-headedness.  Psychiatric/Behavioral: Negative for dysphoric mood. The patient is not nervous/anxious.        Objective:   Vitals:   08/19/18 0754  BP: 138/82  Pulse: 73  Resp: 16  Temp: 98.2 F (36.8 C)  SpO2: 98%   Filed Weights   08/19/18 0754  Weight: 284 lb (128.8 kg)   Body mass index is 38.52 kg/m.  Wt Readings from Last 3 Encounters:  08/19/18 284 lb (128.8 kg)  05/12/18 279 lb (126.6 kg)  02/19/18 290 lb (131.5 kg)    No exam data present    Physical Exam Constitutional: He appears well-developed and well-nourished. No distress.  HENT:  Head: Normocephalic and atraumatic.  Right Ear: External ear normal.  Left Ear: External ear normal.  Mouth/Throat: Oropharynx is clear and moist.  Normal ear canals and TM b/l  Eyes: Conjunctivae and EOM are normal.  Neck: Neck supple. No tracheal deviation present. No thyromegaly present.  No carotid bruit  Cardiovascular: Normal rate, regular rhythm, normal heart sounds and intact distal pulses.   No murmur heard. Pulmonary/Chest: Effort normal and breath sounds normal. No respiratory distress. He has no wheezes. He has no rales.  Abdominal: Soft. Ventral and umbilical hernia - reducible and non tender. He exhibits no distension. There is no tenderness.  Genitourinary: deferred  Musculoskeletal: He exhibits no edema.  Lymphadenopathy:   He has no cervical adenopathy.  Skin: Skin is warm and dry. He is not diaphoretic.  Psychiatric: He has a normal mood and affect. His behavior is normal.         Assessment & Plan:   Wellness Exam, welcome: Immunizations   Prevnar today,  flu vaccine this fall, had shingles Colonoscopy   up-to-date EKG today:  NSR at 67 bpm, normal EKG.  Compared to EKG 01/2016 there have been no changes Eye exam   Up to  date  Hearing loss   none Memory concerns/difficulties    none Independent of ADLs   Fully independent Stressed the importance of regular exercise   Patient received copy of preventative screening tests/immunizations recommended for the next 5-10 years.    See Problem List for Assessment and Plan of chronic medical problems.

## 2018-08-19 NOTE — Assessment & Plan Note (Signed)
Encouraged weight loss Continue regular exercise Decrease portions

## 2018-08-19 NOTE — Assessment & Plan Note (Signed)
BP well controlled Current regimen effective and well tolerated Continue current medications at current doses cmp  

## 2018-08-19 NOTE — Assessment & Plan Note (Addendum)
Follows with urology Taking Flomax daily Symptoms not controlled - will be having the urolift

## 2018-08-19 NOTE — Patient Instructions (Addendum)
[name] , Thank you for taking time to come for your Medicare Wellness Visit. I appreciate your ongoing commitment to your health goals. Please review the following plan we discussed and let me know if I can assist you in the future.   These are the goals we discussed: Goals   Work on weight loss     This is a list of the screening recommended for you and due dates:  Health Maintenance  Topic Date Due  . Pneumonia vaccines (1 of 2 - PCV13) Given today  . Flu Shot  07/29/2018  . Tetanus Vaccine  09/05/2019  . Colon Cancer Screening  04/29/2025  .  Hepatitis C: One time screening is recommended by Center for Disease Control  (CDC) for  adults born from [birth year] through 1963.   Completed  . HIV Screening  Completed     Test(s) ordered today. Your results will be released to [place] (or called to you) after review, usually within 72hours after test completion. If any changes need to be made, you will be notified at that same time.  All other Health Maintenance issues reviewed.   All recommended immunizations and age-appropriate screenings are up-to-date or discussed.  prevnar pneumonia immunization administered today.   Medications reviewed and updated.   No changes recommended at this time.   Please followup in 6 months     Health Maintenance, Male A healthy lifestyle and preventive care is important for your health and wellness. Ask your health care provider about what schedule of regular examinations is right for you. What should I know about weight and diet? Eat a Healthy Diet  Eat plenty of vegetables, fruits, whole grains, low-fat dairy products, and lean protein.  Do not eat a lot of foods high in solid fats, added sugars, or salt.  Maintain a Healthy Weight Regular exercise can help you achieve or maintain a healthy weight. You should:  Do at least 150 minutes of exercise each week. The exercise should increase your heart rate and make you sweat (moderate-intensity  exercise).  Do strength-training exercises at least twice a week.  Watch Your Levels of Cholesterol and Blood Lipids  Have your blood tested for lipids and cholesterol every 5 years starting at 65 years of age. If you are at high risk for heart disease, you should start having your blood tested when you are 65 years old. You may need to have your cholesterol levels checked more often if: ? Your lipid or cholesterol levels are high. ? You are older than 65 years of age. ? You are at high risk for heart disease.  What should I know about cancer screening? Many types of cancers can be detected early and may often be prevented. Lung Cancer  You should be screened every year for lung cancer if: ? You are a current smoker who has smoked for at least 30 years. ? You are a former smoker who has quit within the past 15 years.  Talk to your health care provider about your screening options, when you should start screening, and how often you should be screened.  Colorectal Cancer  Routine colorectal cancer screening usually begins at 65 years of age and should be repeated every 5-10 years until you are 65 years old. You may need to be screened more often if early forms of precancerous polyps or small growths are found. Your health care provider may recommend screening at an earlier age if you have risk factors for colon cancer.  Your health care provider may recommend using home test kits to check for hidden blood in the stool.  A small camera at the end of a tube can be used to examine your colon (sigmoidoscopy or colonoscopy). This checks for the earliest forms of colorectal cancer.  Prostate and Testicular Cancer  Depending on your age and overall health, your health care provider may do certain tests to screen for prostate and testicular cancer.  Talk to your health care provider about any symptoms or concerns you have about testicular or prostate cancer.  Skin Cancer  Check your skin  from head to toe regularly.  Tell your health care provider about any new moles or changes in moles, especially if: ? There is a change in a mole's size, shape, or color. ? You have a mole that is larger than a pencil eraser.  Always use sunscreen. Apply sunscreen liberally and repeat throughout the day.  Protect yourself by wearing long sleeves, pants, a wide-brimmed hat, and sunglasses when outside.  What should I know about heart disease, diabetes, and high blood pressure?  If you are 55-52 years of age, have your blood pressure checked every 3-5 years. If you are 34 years of age or older, have your blood pressure checked every year. You should have your blood pressure measured twice-once when you are at a hospital or clinic, and once when you are not at a hospital or clinic. Record the average of the two measurements. To check your blood pressure when you are not at a hospital or clinic, you can use: ? An automated blood pressure machine at a pharmacy. ? A home blood pressure monitor.  Talk to your health care provider about your target blood pressure.  If you are between 31-8 years old, ask your health care provider if you should take aspirin to prevent heart disease.  Have regular diabetes screenings by checking your fasting blood sugar level. ? If you are at a normal weight and have a low risk for diabetes, have this test once every three years after the age of 67. ? If you are overweight and have a high risk for diabetes, consider being tested at a younger age or more often.  A one-time screening for abdominal aortic aneurysm (AAA) by ultrasound is recommended for men aged 17-75 years who are current or former smokers. What should I know about preventing infection? Hepatitis B If you have a higher risk for hepatitis B, you should be screened for this virus. Talk with your health care provider to find out if you are at risk for hepatitis B infection. Hepatitis C Blood testing is  recommended for:  Everyone born from [birth year] through 1963.  Anyone with known risk factors for hepatitis C.  Sexually Transmitted Diseases (STDs)  You should be screened each year for STDs including gonorrhea and chlamydia if: ? You are sexually active and are younger than 65 years of age. ? You are older than 65 years of age and your health care provider tells you that you are at risk for this type of infection. ? Your sexual activity has changed since you were last screened and you are at an increased risk for chlamydia or gonorrhea. Ask your health care provider if you are at risk.  Talk with your health care provider about whether you are at high risk of being infected with HIV. Your health care provider may recommend a prescription medicine to help prevent HIV infection.  What else can I do?  Schedule regular health, dental, and eye exams.  Stay current with your vaccines (immunizations).  Do not use any tobacco products, such as cigarettes, chewing tobacco, and e-cigarettes. If you need help quitting, ask your health care provider.  Limit alcohol intake to no more than 2 drinks per day. One drink equals 12 ounces of beer, 5 ounces of wine, or 1 ounces of hard liquor.  Do not use street drugs.  Do not share needles.  Ask your health care provider for help if you need support or information about quitting drugs.  Tell your health care provider if you often feel depressed.  Tell your health care provider if you have ever been abused or do not feel safe at home. This information is not intended to replace advice given to you by your health care provider. Make sure you discuss any questions you have with your health care provider. Document Released: 06/12/2008 Document Revised: 08/13/2016 Document Reviewed: 09/18/2015 Elsevier Interactive Patient Education  [employer].

## 2018-09-06 DIAGNOSIS — R351 Nocturia: Secondary | ICD-10-CM | POA: Diagnosis not present

## 2018-09-06 DIAGNOSIS — N401 Enlarged prostate with lower urinary tract symptoms: Secondary | ICD-10-CM | POA: Diagnosis not present

## 2018-09-08 ENCOUNTER — Telehealth: Payer: Self-pay | Admitting: Internal Medicine

## 2018-09-08 NOTE — Telephone Encounter (Signed)
Recent EKG has been faxed over to Dr. Alyson Ingles.

## 2018-09-08 NOTE — Telephone Encounter (Signed)
Copied from [place] [id number]. Topic: Quick Communication - See Telephone Encounter >> Sep 08, 2018 12:59 PM Neva Seat wrote: Meredith Mody from Dr. Saralyn Pilar McKenzie's office - [phone] - Phone  / [phone] - Fax  Dr. Alyson Ingles is needing pt's latest EKG done.  Pt thinking it was done in July.

## 2018-09-20 ENCOUNTER — Encounter: Payer: Self-pay | Admitting: Internal Medicine

## 2018-09-20 DIAGNOSIS — N401 Enlarged prostate with lower urinary tract symptoms: Secondary | ICD-10-CM | POA: Diagnosis not present

## 2018-09-20 DIAGNOSIS — R351 Nocturia: Secondary | ICD-10-CM | POA: Diagnosis not present

## 2018-09-20 DIAGNOSIS — I4891 Unspecified atrial fibrillation: Secondary | ICD-10-CM

## 2018-09-22 DIAGNOSIS — N401 Enlarged prostate with lower urinary tract symptoms: Secondary | ICD-10-CM | POA: Diagnosis not present

## 2018-09-22 DIAGNOSIS — R351 Nocturia: Secondary | ICD-10-CM | POA: Diagnosis not present

## 2018-09-24 ENCOUNTER — Ambulatory Visit: Payer: Self-pay

## 2018-09-24 ENCOUNTER — Ambulatory Visit: Payer: Medicare Other

## 2018-09-24 DIAGNOSIS — Z23 Encounter for immunization: Secondary | ICD-10-CM

## 2018-09-30 NOTE — Progress Notes (Signed)
Cardiology Office Note    Date:  10/01/2018   ID:  [name], DOB Feb 04, 1951, MRN [id number]  PCP:  Binnie Rail, MD  Cardiologist:  Fransico Him, MD   Chief Complaint  Patient presents with  . New Patient (Initial Visit)    excessive daytime sleepiness    History of Present Illness:  [name] is a 67 y.o. male who is being seen today for the evaluation of possible OSA at the request of Burns, Claudina Lick, MD.  This is a very pleasant 66yo male with a history of HTN and obesity who requested a sleep evaluation.   He recently underwent a urologic procedure and was noted to have significant apnea during the procedure with O2 desats into the 40's and an ETT had to be placed.  He tells me that his wife has said for years that she notices that he has apneas in his sleep and will snore.  He does get sleepy towards the evening and frequently will nod off to sleep.  He also is concerned about his heart.  He has not had any CP or pressure and no DOE but notices when it is hot outside and he is working in the yard that his heart will beat faster.  He has never smoked but has a family hx of CAD. He also had HTN and pre diabetes as well as obesity.     Past Medical History:  Diagnosis Date  . Arthritis    Knees  . BPH (benign prostatic hypertrophy)   . COLONIC POLYPS, HX OF 2007   clear colo w/o polyps 04/2015: 62yr follow up  . HYPERTENSION   . HYPOTHYROIDISM   . OBESITY   . PAF (paroxysmal atrial fibrillation) ([place]) 10/01/2018    Past Surgical History:  Procedure Laterality Date  . arthroscopic knee surgery     (R) 2003 & (L) 2010  . COLONOSCOPY  04/2015   no polyps ([facility])  . LAMINECTOMY  1991  . TONSILLECTOMY  1990   w/ adenoids and uvula  . TONSILLECTOMY    . TOTAL HIP ARTHROPLASTY Right 01/2011   [place]  . TOTAL HIP ARTHROPLASTY Left 04/16/2016   Procedure: TOTAL HIP ARTHROPLASTY ANTERIOR APPROACH;  Surgeon: Gaynelle Arabian, MD;  Location: [facility];  Service: Orthopedics;   Laterality: Left;  . TOTAL KNEE ARTHROPLASTY  12/27/2012   Procedure: TOTAL KNEE BILATERAL;  Surgeon: Gearlean Alf, MD;  Location: [facility];  Service: Orthopedics;  Laterality: Bilateral;    Current Medications: Current Meds  Medication Sig  . amLODipine (NORVASC) 5 MG tablet Take 1 tablet (5 mg total) by mouth daily.  . clotrimazole-betamethasone (LOTRISONE) cream Apply 1 application topically 2 (two) times daily.  Marland Kitchen levothyroxine (SYNTHROID, LEVOTHROID) 150 MCG tablet Take 1 tablet (150 mcg total) by mouth daily.  . meloxicam (MOBIC) 15 MG tablet Take 15 mg by mouth daily.  . Multiple Vitamins-Minerals (MULTIVITAMIN) tablet Take 1 tablet by mouth daily.  . tamsulosin (FLOMAX) 0.4 MG CAPS capsule Take 0.4 mg by mouth at bedtime.    Allergies:   Patient has no known allergies.   Social History   Socioeconomic History  . Marital status: Married    Spouse name: Not on file  . Number of children: Not on file  . Years of education: Not on file  . Highest education level: Not on file  Occupational History  . Not on file  Social Needs  . Financial resource strain: Not on file  .  Food insecurity:    Worry: Not on file    Inability: Not on file  . Transportation needs:    Medical: Not on file    Non-medical: Not on file  Tobacco Use  . Smoking status: Never Smoker  . Smokeless tobacco: Never Used  Substance and Sexual Activity  . Alcohol use: Yes    Alcohol/week: 0.0 standard drinks    Comment: rarely, social  . Drug use: No  . Sexual activity: Not on file  Lifestyle  . Physical activity:    Days per week: Not on file    Minutes per session: Not on file  . Stress: Not on file  Relationships  . Social connections:    Talks on phone: Not on file    Gets together: Not on file    Attends religious service: Not on file    Active member of club or organization: Not on file    Attends meetings of clubs or organizations: Not on file    Relationship status: Not on file    Other Topics Concern  . Not on file  Social History Narrative   Working part time, contemplating retirement end of 2016   Lives at home with spouse      Exercise: water exercises     Family History:  The patient's family history includes ALS in his maternal grandfather; Arthritis in his other; Dementia in his mother; Diabetes in his father; Heart disease (age of onset: 4) in his father; Hypertension in his father.   ROS:   Please see the history of present illness.    ROS All other systems reviewed and are negative.  No flowsheet data found.     PHYSICAL EXAM:   VS:  BP 126/70   Pulse 74   Ht 6' (1.829 m)   Wt 285 lb (129.3 kg)   BMI 38.65 kg/m    GEN: Well nourished, well developed, in no acute distress  HEENT: normal  Neck: no JVD, carotid bruits, or masses Cardiac: RRR; no murmurs, rubs, or gallops,no edema.  Intact distal pulses bilaterally.  Respiratory:  clear to auscultation bilaterally, normal work of breathing GI: soft, nontender, nondistended, + BS MS: no deformity or atrophy  Skin: warm and dry, no rash Neuro:  Alert and Oriented x 3, Strength and sensation are intact Psych: euthymic mood, full affect  Wt Readings from Last 3 Encounters:  10/01/18 285 lb (129.3 kg)  08/19/18 284 lb (128.8 kg)  05/12/18 279 lb (126.6 kg)      Studies/Labs Reviewed:   EKG:  EKG is ordered today.  The ekg ordered today demonstrates NSR at 74bpm with no ST changes  Recent Labs: 08/19/2018: ALT 23; BUN 26; Creatinine, Ser 0.99; Hemoglobin 15.7; Platelets 195.0; Potassium 4.5; Sodium 138; TSH 1.04   Lipid Panel    Component Value Date/Time   CHOL 179 08/19/2018 0834   TRIG 93.0 08/19/2018 0834   HDL 48.10 08/19/2018 0834   CHOLHDL 4 08/19/2018 0834   VLDL 18.6 08/19/2018 0834   LDLCALC 112 (H) 08/19/2018 0834   LDLDIRECT 153.1 06/09/2011 0942    Additional studies/ records that were reviewed today include:  Office notes from PCP    ASSESSMENT:    1.  Excessive daytime sleepiness   2. Essential hypertension   3. Family history of early CAD      PLAN:  In order of problems listed above:  1.  Excessive daytime sleepiness - I suspect that he likely has significant OSA  given his recent surgical procedure where he had significant hypoxemia requiring emergent intubation.  I will order a split night sleep study.   2.  HTN - BP is controlled on exam today.  He will continue on amlodipine 5mg  daily.   3.  Family history of CAD - his father had CABG.  The patient has HTN and pre DM and is obese.  I will get an ETT to rule out ischemia and assess future risk.    Medication Adjustments/Labs and Tests Ordered: Current medicines are reviewed at length with the patient today.  Concerns regarding medicines are outlined above.  Medication changes, Labs and Tests ordered today are listed in the Patient Instructions below.  There are no Patient Instructions on file for this visit.   Signed, Fransico Him, MD  10/01/2018 11:15 AM    [facility] [address], [place], [place]  [address] Phone: [phone]; Fax: [phone]

## 2018-10-01 ENCOUNTER — Telehealth: Payer: Self-pay | Admitting: *Deleted

## 2018-10-01 ENCOUNTER — Encounter: Payer: Self-pay | Admitting: Cardiology

## 2018-10-01 ENCOUNTER — Ambulatory Visit (INDEPENDENT_AMBULATORY_CARE_PROVIDER_SITE_OTHER): Payer: Medicare Other | Admitting: Cardiology

## 2018-10-01 VITALS — BP 126/70 | HR 74 | Ht 72.0 in | Wt 285.0 lb

## 2018-10-01 DIAGNOSIS — G4719 Other hypersomnia: Secondary | ICD-10-CM

## 2018-10-01 DIAGNOSIS — I480 Paroxysmal atrial fibrillation: Secondary | ICD-10-CM

## 2018-10-01 DIAGNOSIS — Z8249 Family history of ischemic heart disease and other diseases of the circulatory system: Secondary | ICD-10-CM | POA: Diagnosis not present

## 2018-10-01 DIAGNOSIS — I10 Essential (primary) hypertension: Secondary | ICD-10-CM | POA: Diagnosis not present

## 2018-10-01 HISTORY — DX: Paroxysmal atrial fibrillation: I48.0

## 2018-10-01 NOTE — Telephone Encounter (Signed)
Staff message sent to Nina ok to schedule sleep study. No PA is required. 

## 2018-10-01 NOTE — Telephone Encounter (Signed)
-----   Message from Sarina Ill, RN sent at 10/01/2018 11:21 AM EDT ----- Regarding: Sleep Study Hello, Dr. Radford Pax ordered a sleep study. The ESS: 7. Please schedule.  Thanks, [employer]

## 2018-10-01 NOTE — Patient Instructions (Signed)
Medication Instructions:  Your physician recommends that you continue on your current medications as directed. Please refer to the Current Medication list given to you today.  Testing/Procedures: Your physician has recommended that you have a sleep study. This test records several body functions during sleep, including: brain activity, eye movement, oxygen and carbon dioxide blood levels, heart rate and rhythm, breathing rate and rhythm, the flow of air through your mouth and nose, snoring, body muscle movements, and chest and belly movement.  Your physician has requested that you have an exercise tolerance test. For further information please visit [email or web address]. Please also follow instruction sheet, as given.  Follow-Up: As needed  If you need a refill on your cardiac medications before your next appointment, please call your pharmacy.

## 2018-10-04 DIAGNOSIS — N401 Enlarged prostate with lower urinary tract symptoms: Secondary | ICD-10-CM | POA: Diagnosis not present

## 2018-10-04 DIAGNOSIS — R351 Nocturia: Secondary | ICD-10-CM | POA: Diagnosis not present

## 2018-10-05 ENCOUNTER — Other Ambulatory Visit: Payer: Self-pay

## 2018-10-05 NOTE — Telephone Encounter (Signed)
Patient is scheduled for lab study on 11/07/18. Patient understands his sleep study will be done at [facility] sleep lab. Patient understands he will receive a sleep packet in a week or so. Patient understands to call if he does not receive the sleep packet in a timely manner. Patient agrees with treatment and thanked me for call.

## 2018-10-05 NOTE — Telephone Encounter (Signed)
Received fax from pharmacy stating that pt is requesting that you refill meloxicam 15 mg. You have not filled this before. Please advise.

## 2018-10-05 NOTE — Telephone Encounter (Signed)
Ok - pending - not sure which pharmacy he wants it sent to

## 2018-10-05 NOTE — Telephone Encounter (Signed)
RE: Sleep [name], CMA  Freada Bergeron, CMA        Ok to schedule. No PA required.

## 2018-10-06 ENCOUNTER — Ambulatory Visit (INDEPENDENT_AMBULATORY_CARE_PROVIDER_SITE_OTHER): Payer: Medicare Other

## 2018-10-06 DIAGNOSIS — Z8249 Family history of ischemic heart disease and other diseases of the circulatory system: Secondary | ICD-10-CM | POA: Diagnosis not present

## 2018-10-06 DIAGNOSIS — I10 Essential (primary) hypertension: Secondary | ICD-10-CM

## 2018-10-06 LAB — EXERCISE TOLERANCE TEST
Estimated workload: 7.6 METS
Exercise duration (min): 7 min
Exercise duration (sec): 0 s
MPHR: 155 {beats}/min
Peak HR: 137 {beats}/min
Percent HR: 88 %
RPE: 17
Rest HR: 75 {beats}/min

## 2018-10-06 MED ORDER — MELOXICAM 15 MG PO TABS: 15.0000 mg | ORAL_TABLET | Freq: Every day | ORAL | 0 refills | Status: DC

## 2018-10-18 ENCOUNTER — Telehealth: Payer: Self-pay | Admitting: Cardiology

## 2018-10-18 NOTE — Telephone Encounter (Signed)
New Message         Patient was told to call back with some bp readings     10/11 158/94     10/12 165/97     10/13  161/89      10/14  156/94       10/15  169/96        10/16/ 155/88         10/17/ 152/82          10/18  170/95           10/19  164/92            10/20  167/92               10/21/ 169/178

## 2018-10-20 MED ORDER — AMLODIPINE BESYLATE 10 MG PO TABS: 10.0000 mg | ORAL_TABLET | Freq: Every day | ORAL | 3 refills | Status: DC

## 2018-10-20 NOTE — Telephone Encounter (Signed)
Spoke with the patient about his blood pressure results, per Dr. Radford Pax, increase norvasc to 10 mg. The patient accepted and expressed understanding.

## 2018-10-20 NOTE — Telephone Encounter (Signed)
Per MyChart encounter:  Increase norvasc to 10mg  daily and check BP daily for a week and call with results'   [place]

## 2018-10-22 ENCOUNTER — Ambulatory Visit (INDEPENDENT_AMBULATORY_CARE_PROVIDER_SITE_OTHER): Payer: Medicare Other | Admitting: Internal Medicine

## 2018-10-22 ENCOUNTER — Encounter: Payer: Self-pay | Admitting: Internal Medicine

## 2018-10-22 VITALS — BP 140/74 | HR 92 | Temp 98.3°F | Resp 18 | Ht 72.0 in | Wt 289.1 lb

## 2018-10-22 DIAGNOSIS — J069 Acute upper respiratory infection, unspecified: Secondary | ICD-10-CM | POA: Diagnosis not present

## 2018-10-22 MED ORDER — HYDROCOD POLST-CPM POLST ER 10-8 MG/5ML PO SUER: 5.0000 mL | Freq: Two times a day (BID) | ORAL | 0 refills | Status: DC | PRN

## 2018-10-22 NOTE — Assessment & Plan Note (Signed)
Cold symptoms likely viral in nature No need for antibiotics at this time Discussed symptomatic treatment Will prescribe a cough syrup with hydrocodone  increase rest and fluids Call if no improvement

## 2018-10-22 NOTE — Progress Notes (Signed)
Subjective:    Patient ID: [name], male    DOB: March 23, 1951, 67 y.o.   MRN: [id number]  HPI He is here for an acute visit for cold symptoms.  His symptoms started 3-4 days ago  He is experiencing left ear pain, sore throat, dry cough that is slightly productive in the morning and a night and headaches.  He denies any fever, chills, nasal congestion, sinus pain, sneezing, shortness of breath, wheezing, nausea and body aches.  He is worried about if he is contagious.  He has tried taking nothing   Medications and allergies reviewed with patient and updated if appropriate.  Patient Active Problem List   Diagnosis Date Noted  . Rash and nonspecific skin eruption 02/19/2018  . Sleep disorder 07/02/2017  . Prediabetes 05/11/2017  . Ventral hernia without obstruction or gangrene 05/11/2017  . Umbilical hernia without obstruction or gangrene 05/11/2017  . OA (osteoarthritis) of hip 04/16/2016  . Pre-syncope 02/22/2016  . Vertigo 02/22/2016  . Knee joint replacement status 01/12/2013  . OA (osteoarthritis) of knee 12/27/2012  . Benign prostatic hyperplasia   . Hypothyroidism 09/04/2009  . Obesity 09/04/2009  . Essential hypertension 09/04/2009  . ARTHRITIS 09/04/2009  . COLONIC POLYPS, HX OF 09/04/2009    Current Outpatient Medications on File Prior to Visit  Medication Sig Dispense Refill  . amLODipine (NORVASC) 10 MG tablet Take 1 tablet (10 mg total) by mouth daily. 90 tablet 3  . clotrimazole-betamethasone (LOTRISONE) cream Apply 1 application topically 2 (two) times daily. 45 g 3  . levothyroxine (SYNTHROID, LEVOTHROID) 150 MCG tablet Take 1 tablet (150 mcg total) by mouth daily. 90 tablet 1  . meloxicam (MOBIC) 15 MG tablet Take 1 tablet (15 mg total) by mouth daily. 90 tablet 0  . Multiple Vitamins-Minerals (MULTIVITAMIN) tablet Take 1 tablet by mouth daily.     No current facility-administered medications on file prior to visit.     Past Medical History:    Diagnosis Date  . Arthritis    Knees  . BPH (benign prostatic hypertrophy)   . COLONIC POLYPS, HX OF 2007   clear colo w/o polyps 04/2015: 67yr follow up  . HYPERTENSION   . HYPOTHYROIDISM   . OBESITY   . PAF (paroxysmal atrial fibrillation) ([place]) 10/01/2018    Past Surgical History:  Procedure Laterality Date  . arthroscopic knee surgery     (R) 2003 & (L) 2010  . COLONOSCOPY  04/2015   no polyps ([place])  . LAMINECTOMY  1991  . TONSILLECTOMY  1990   w/ adenoids and uvula  . TONSILLECTOMY    . TOTAL HIP ARTHROPLASTY Right 01/2011   [place]  . TOTAL HIP ARTHROPLASTY Left 04/16/2016   Procedure: TOTAL HIP ARTHROPLASTY ANTERIOR APPROACH;  Surgeon: Gaynelle Arabian, MD;  Location: [facility];  Service: Orthopedics;  Laterality: Left;  . TOTAL KNEE ARTHROPLASTY  12/27/2012   Procedure: TOTAL KNEE BILATERAL;  Surgeon: Gearlean Alf, MD;  Location: [facility];  Service: Orthopedics;  Laterality: Bilateral;    Social History   Socioeconomic History  . Marital status: Married    Spouse name: Not on file  . Number of children: Not on file  . Years of education: Not on file  . Highest education level: Not on file  Occupational History  . Not on file  Social Needs  . Financial resource strain: Not on file  . Food insecurity:    Worry: Not on file    Inability: Not on file  .  Transportation needs:    Medical: Not on file    Non-medical: Not on file  Tobacco Use  . Smoking status: Never Smoker  . Smokeless tobacco: Never Used  Substance and Sexual Activity  . Alcohol use: Yes    Alcohol/week: 0.0 standard drinks    Comment: rarely, social  . Drug use: No  . Sexual activity: Not on file  Lifestyle  . Physical activity:    Days per week: Not on file    Minutes per session: Not on file  . Stress: Not on file  Relationships  . Social connections:    Talks on phone: Not on file    Gets together: Not on file    Attends religious service: Not on file    Active member of club or  organization: Not on file    Attends meetings of clubs or organizations: Not on file    Relationship status: Not on file  Other Topics Concern  . Not on file  Social History Narrative   Working part time, contemplating retirement end of 2016   Lives at home with spouse      Exercise: water exercises    Family History  Problem Relation Age of Onset  . Heart disease Father 55       CABG age 70  . Hypertension Father   . Diabetes Father   . Dementia Mother        ?ALS vs SNP  . ALS Maternal Grandfather   . Arthritis Other        Parent & grandparents  . Colon cancer Neg Hx     Review of Systems  Constitutional: Negative for chills and fever.  HENT: Positive for ear pain (left ear) and sore throat. Negative for congestion, sinus pain and sneezing.   Respiratory: Positive for cough (slightly productive - morning and evening). Negative for chest tightness, shortness of breath and wheezing.   Gastrointestinal: Negative for diarrhea and nausea.  Musculoskeletal: Negative for myalgias.  Neurological: Positive for headaches. Negative for dizziness and light-headedness.       Objective:   Vitals:   10/22/18 1310  BP: 140/74  Pulse: 92  Resp: 18  Temp: 98.3 F (36.8 C)  SpO2: 96%   BP Readings from Last 3 Encounters:  10/22/18 140/74  10/01/18 126/70  08/19/18 138/82   Wt Readings from Last 3 Encounters:  10/22/18 289 lb 1.9 oz (131.1 kg)  10/01/18 285 lb (129.3 kg)  08/19/18 284 lb (128.8 kg)   Body mass index is 39.21 kg/m.   Physical Exam    GENERAL APPEARANCE: Appears stated age, well appearing, NAD EYES: conjunctiva clear, no icterus HEENT: bilateral tympanic membranes and ear canals normal, oropharynx with mild erythema, no thyromegaly, trachea midline, no cervical or supraclavicular lymphadenopathy LUNGS: Clear to auscultation without wheeze or crackles, unlabored breathing, good air entry bilaterally CARDIOVASCULAR: Normal S1,S2 without murmurs, no  edema SKIN: Warm, dry      Assessment & Plan:    See Problem List for Assessment and Plan of chronic medical problems.

## 2018-10-22 NOTE — Patient Instructions (Addendum)
Take the cough syrup as needed.  Take over the counter cold medications (without benadryl).  Increase rest and fluids.     Call if no improvement     Upper Respiratory Infection, Adult Most upper respiratory infections (URIs) are a viral infection of the air passages leading to the lungs. A URI affects the nose, throat, and upper air passages. The most common type of URI is nasopharyngitis and is typically referred to as "the common cold." URIs run their course and usually go away on their own. Most of the time, a URI does not require medical attention, but sometimes a bacterial infection in the upper airways can follow a viral infection. This is called a secondary infection. Sinus and middle ear infections are common types of secondary upper respiratory infections. Bacterial pneumonia can also complicate a URI. A URI can worsen asthma and chronic obstructive pulmonary disease (COPD). Sometimes, these complications can require emergency medical care and may be life threatening. What are the causes? Almost all URIs are caused by viruses. A virus is a type of germ and can spread from one person to another. What increases the risk? You may be at risk for a URI if:  You smoke.  You have chronic heart or lung disease.  You have a weakened defense (immune) system.  You are very young or very old.  You have nasal allergies or asthma.  You work in crowded or poorly ventilated areas.  You work in health care facilities or schools.  What are the signs or symptoms? Symptoms typically develop 2-3 days after you come in contact with a cold virus. Most viral URIs last 7-10 days. However, viral URIs from the influenza virus (flu virus) can last 14-18 days and are typically more severe. Symptoms may include:  Runny or stuffy (congested) nose.  Sneezing.  Cough.  Sore throat.  Headache.  Fatigue.  Fever.  Loss of appetite.  Pain in your forehead, behind your eyes, and over your  cheekbones (sinus pain).  Muscle aches.  How is this diagnosed? Your health care provider may diagnose a URI by:  Physical exam.  Tests to check that your symptoms are not due to another condition such as: ? Strep throat. ? Sinusitis. ? Pneumonia. ? Asthma.  How is this treated? A URI goes away on its own with time. It cannot be cured with medicines, but medicines may be prescribed or recommended to relieve symptoms. Medicines may help:  Reduce your fever.  Reduce your cough.  Relieve nasal congestion.  Follow these instructions at home:  Take medicines only as directed by your health care provider.  Gargle warm saltwater or take cough drops to comfort your throat as directed by your health care provider.  Use a warm mist humidifier or inhale steam from a shower to increase air moisture. This may make it easier to breathe.  Drink enough fluid to keep your urine clear or pale yellow.  Eat soups and other clear broths and maintain good nutrition.  Rest as needed.  Return to work when your temperature has returned to normal or as your health care provider advises. You may need to stay home longer to avoid infecting others. You can also use a face mask and careful hand washing to prevent spread of the virus.  Increase the usage of your inhaler if you have asthma.  Do not use any tobacco products, including cigarettes, chewing tobacco, or electronic cigarettes. If you need help quitting, ask your health care provider. How  is this prevented? The best way to protect yourself from getting a cold is to practice good hygiene.  Avoid oral or hand contact with people with cold symptoms.  Wash your hands often if contact occurs.  There is no clear evidence that vitamin C, vitamin E, echinacea, or exercise reduces the chance of developing a cold. However, it is always recommended to get plenty of rest, exercise, and practice good nutrition. Contact a health care provider  if:  You are getting worse rather than better.  Your symptoms are not controlled by medicine.  You have chills.  You have worsening shortness of breath.  You have brown or red mucus.  You have yellow or brown nasal discharge.  You have pain in your face, especially when you bend forward.  You have a fever.  You have swollen neck glands.  You have pain while swallowing.  You have white areas in the back of your throat. Get help right away if:  You have severe or persistent: ? Headache. ? Ear pain. ? Sinus pain. ? Chest pain.  You have chronic lung disease and any of the following: ? Wheezing. ? Prolonged cough. ? Coughing up blood. ? A change in your usual mucus.  You have a stiff neck.  You have changes in your: ? Vision. ? Hearing. ? Thinking. ? Mood. This information is not intended to replace advice given to you by your health care provider. Make sure you discuss any questions you have with your health care provider. Document Released: 06/10/2001 Document Revised: 08/17/2016 Document Reviewed: 03/22/2014 Elsevier Interactive Patient Education  [employer].

## 2018-11-04 ENCOUNTER — Ambulatory Visit (HOSPITAL_BASED_OUTPATIENT_CLINIC_OR_DEPARTMENT_OTHER): Payer: Medicare Other | Attending: Cardiology | Admitting: Cardiology

## 2018-11-04 VITALS — Ht 72.0 in | Wt 280.0 lb

## 2018-11-04 DIAGNOSIS — G4719 Other hypersomnia: Secondary | ICD-10-CM | POA: Diagnosis not present

## 2018-11-04 DIAGNOSIS — Z9989 Dependence on other enabling machines and devices: Secondary | ICD-10-CM | POA: Insufficient documentation

## 2018-11-04 DIAGNOSIS — G4733 Obstructive sleep apnea (adult) (pediatric): Secondary | ICD-10-CM | POA: Diagnosis not present

## 2018-11-07 ENCOUNTER — Encounter (HOSPITAL_BASED_OUTPATIENT_CLINIC_OR_DEPARTMENT_OTHER): Payer: Medicare Other

## 2018-11-11 ENCOUNTER — Other Ambulatory Visit: Payer: Self-pay | Admitting: Internal Medicine

## 2018-11-11 NOTE — Procedures (Signed)
Patient Name: [name], [name] Date: 11/04/2018   Gender: Male  D.O.B: [birth year]  Age (years): 49  Referring Provider: Fransico Him MD, ABSM  Height (inches): 72  Interpreting Physician: Fransico Him MD, ABSM  Weight (lbs): 280  RPSGT: Lanae Boast  BMI: 38  MRN: [id number]  Neck Size: 18.00   CLINICAL INFORMATION  Sleep Study Type: Split Night CPAP Indication for sleep study: Excessive Daytime Sleepiness Epworth Sleepiness Score: 5  SLEEP STUDY TECHNIQUE  As per the AASM Manual for the Scoring of Sleep and Associated Events v2.3 (April 2016) with a hypopnea requiring 4% desaturations. The channels recorded and monitored were frontal, central and occipital EEG, electrooculogram (EOG), submentalis EMG (chin), nasal and oral airflow, thoracic and abdominal wall motion, anterior tibialis EMG, snore microphone, electrocardiogram, and pulse oximetry. Continuous positive airway pressure (CPAP) was initiated when the patient met split night criteria and was titrated according to treat sleep-disordered breathing.  MEDICATIONS  Medications self-administered by patient taken the night of the study : N/A  RESPIRATORY PARAMETERS   Diagnostic Total AHI (/hr): 68.7 RDI (/hr): 69.0 OA Index (/hr): 52.3 CA Index (/hr): 4.3  REM AHI (/hr): N/A NREM AHI (/hr): 68.7 Supine AHI (/hr): 68.7 Non-supine AHI (/hr): 0  Min O2 Sat (%): 84.0 Mean O2 (%): 90.9 Time below 88% (min): 33.3      Titration Optimal Pressure (cm):   AHI at Optimal Pressure (/hr): N/A Min O2 at Optimal Pressure (%): 82.0  Supine % at Optimal (%): N/A Sleep % at Optimal (%): N/A       SLEEP ARCHITECTURE  The recording time for the entire night was 445.2 minutes. During a baseline period of 251.2 minutes, the patient slept for 182.5 minutes in REM and nonREM, yielding a sleep efficiency of 72.6%%. Sleep onset after lights out was 4.4 minutes with a REM latency of N/A minutes. The patient spent 14.2%% of the  night in stage N1 sleep, 85.8%% in stage N2 sleep, 0.0%% in stage N3 and 0% in REM. During the titration period of 192.8 minutes, the patient slept for 124.0 minutes in REM and nonREM, yielding a sleep efficiency of 64.3%%. Sleep onset after CPAP initiation was 5.0 minutes with a REM latency of 38.5 minutes. The patient spent 30.2%% of the night in stage N1 sleep, 41.9%% in stage N2 sleep, 0.0%% in stage N3 and 27.8% in REM.  CARDIAC DATA  The 2 lead EKG demonstrated sinus rhythm. The mean heart rate was 100.0 beats per minute. Other EKG findings include: Isolated ventricular couplet.  LEG MOVEMENT DATA  The total Periodic Limb Movements of Sleep (PLMS) were 0. The PLMS index was 0.0 .  IMPRESSIONS  Severe obstructive sleep apnea occurred during the diagnostic portion of the study (AHI = 68.7/hour). An optimal PAP pressure could not be selected for this patient based on the available study data. Mild central sleep apnea occurred during the diagnostic portion of the study (CAI = 4.3/hour). Moderate oxygen desaturation was noted during the diagnostic portion of the study (Min O2 =84.0%).  The patient snored with moderate snoring volume during the diagnostic portion of the study.  Isolated ventricular couplet was noted during this study. Clinically significant periodic limb movements did not occur during sleep.  DIAGNOSIS   Obstructive Sleep Apnea (327.23 [G47.33 ICD-10])  Nocturnal Hypoxemia  RECOMMENDATIONS  Inadequate CPAP titration due to ongoing respiratory events and lack of adequate time for titration. Recommend in lab CPAP titration. BiPAP therapy may be needed.  Avoid  alcohol, sedatives and other CNS depressants that may worsen sleep apnea and disrupt normal sleep architecture.  Sleep hygiene should be reviewed to assess factors that may improve sleep quality.  Weight management and regular exercise should be initiated or continued.  [Electronically signed] 11/11/2018 02:03  PM Fransico Him MD, ABSM  Diplomate, American Board of Sleep Medicine

## 2018-11-12 ENCOUNTER — Telehealth: Payer: Self-pay | Admitting: *Deleted

## 2018-11-12 DIAGNOSIS — G4719 Other hypersomnia: Secondary | ICD-10-CM

## 2018-11-12 NOTE — Telephone Encounter (Signed)
-----   Message from Sueanne Margarita, MD sent at 11/11/2018  2:08 PM EST ----- Please let patient know that they have sleep apnea due to the severity of his sleep apnea had in sufficient CPAP titration.  Please set up a repeat CPAP titration in sleep lab with possible need for BiPAP.

## 2018-11-16 ENCOUNTER — Telehealth: Payer: Self-pay | Admitting: *Deleted

## 2018-11-16 NOTE — Telephone Encounter (Signed)
Called results lmtcb. 

## 2018-11-16 NOTE — Telephone Encounter (Signed)
-----   Message from Sueanne Margarita, MD sent at 11/13/2018 11:47 AM EST ----- Correct he had a split night but could not be adequately titrated on CPAP due to lack of time and needs to go back to lab for possible CPAP vs. BipPA  Traci ----- Message ----- From: Freada Bergeron, CMA Sent: 11/12/2018   6:01 PM EST To: Sueanne Margarita, MD  Dr Radford Pax this patient had a split night study. Please advise ----- Message ----- From: Sueanne Margarita, MD Sent: 11/11/2018   2:08 PM EST To: Freada Bergeron, CMA  Please let patient know that they have sleep apnea due to the severity of his sleep apnea had in sufficient CPAP titration.  Please set up a repeat CPAP titration in sleep lab with possible need for BiPAP.

## 2018-11-22 NOTE — Telephone Encounter (Addendum)
Informed patient of sleep study results and patient understanding was verbalized. Patient understands his sleep study showed he has sleep apnea due to the severity of his sleep apnea he had in sufficient cpap titration. Please set up a repeat cpap titration in sleep lab with possible need fo      Patient does not think he will ever be able to sleep enough in the sleep lab. Patient ststes we have met our medical deduction for this year and really do not want to do this again - period.

## 2018-11-22 NOTE — Telephone Encounter (Signed)
Informed patient of sleep study results and patient understanding was verbalized. Patient understands his sleep study showed he has sleep apnea due to the severity of his sleep apnea he had in sufficient cpap titration. Please set up a repeat cpap titration in sleep lab with possible need for BIPAP.  Patient does not think he will ever be able to sleep enough in the sleep lab. Patient ststes we have met our medical deduction for this year and really do not want to do this again - period.

## 2018-12-14 ENCOUNTER — Telehealth: Payer: Self-pay | Admitting: *Deleted

## 2018-12-14 DIAGNOSIS — G4719 Other hypersomnia: Secondary | ICD-10-CM

## 2018-12-14 DIAGNOSIS — G479 Sleep disorder, unspecified: Secondary | ICD-10-CM

## 2018-12-14 NOTE — Telephone Encounter (Addendum)
Patient is scheduled for titration study on 12/15/18. Patient understands his sleep study will be done at [facility] sleep lab. Patient understands he will receive a sleep packet in a week or so. Patient understands to call if he does not receive the sleep packet in a timely manner. Patient agrees with treatment and thanked me for call.

## 2018-12-14 NOTE — Telephone Encounter (Signed)
Medicare/medicare insurance, ok to schedule sleep study. No PA is required.

## 2018-12-14 NOTE — Telephone Encounter (Signed)
Recommend in lab CPAP titration. BiPAP therapy may be needed.  Sent to precert

## 2018-12-15 ENCOUNTER — Encounter

## 2018-12-15 ENCOUNTER — Ambulatory Visit (HOSPITAL_BASED_OUTPATIENT_CLINIC_OR_DEPARTMENT_OTHER): Payer: Medicare Other | Attending: Cardiology | Admitting: Cardiology

## 2018-12-15 VITALS — Ht 72.0 in | Wt 280.0 lb

## 2018-12-15 DIAGNOSIS — G4719 Other hypersomnia: Secondary | ICD-10-CM | POA: Diagnosis not present

## 2018-12-15 DIAGNOSIS — G4733 Obstructive sleep apnea (adult) (pediatric): Secondary | ICD-10-CM

## 2018-12-17 ENCOUNTER — Telehealth: Payer: Medicare Other | Admitting: Family

## 2018-12-17 ENCOUNTER — Encounter: Payer: Self-pay | Admitting: Family

## 2018-12-17 DIAGNOSIS — M545 Low back pain, unspecified: Secondary | ICD-10-CM

## 2018-12-17 MED ORDER — BACLOFEN 10 MG PO TABS: 10.0000 mg | ORAL_TABLET | Freq: Three times a day (TID) | ORAL | 0 refills | Status: DC | PRN

## 2018-12-17 MED ORDER — PREDNISONE 10 MG (21) PO TBPK: ORAL_TABLET | ORAL | 0 refills | Status: DC

## 2018-12-17 NOTE — Progress Notes (Signed)
Thank you for the details you included in the comment boxes. Those details are very helpful in determining the best course of treatment for you and help [place] to provide the best care.  We are sorry that you are not feeling well.  Here is how we plan to help!  Based on what you have shared with me it looks like you mostly have acute back pain.  Acute back pain is defined as musculoskeletal pain that can resolve in 1-3 weeks with conservative treatment.  I have prescribed (use your Mobic sparingly) non-steroid anti-inflammatory (NSAID) as well as Baclofen 10 mg every eight hours as needed which is a muscle relaxer  Some patients experience stomach irritation or in increased heartburn with anti-inflammatory drugs.  Please keep in mind that muscle relaxer's can cause fatigue and should not be taken while at work or driving.  Back pain is very common.  The pain often gets better over time.  The cause of back pain is usually not dangerous.  Most people can learn to manage their back pain on their own.  I have also sent a 10mg  steroid dose pack as you have tolerated this well in the past in your chart.   Home Care  Stay active.  Start with short walks on flat ground if you can.  Try to walk farther each day.  Do not sit, drive or stand in one place for more than 30 minutes.  Do not stay in bed.  Do not avoid exercise or work.  Activity can help your back heal faster.  Be careful when you bend or lift an object.  Bend at your knees, keep the object close to you, and do not twist.  Sleep on a firm mattress.  Lie on your side, and bend your knees.  If you lie on your back, put a pillow under your knees.  Only take medicines as told by your doctor.  Put ice on the injured area.  Put ice in a plastic bag  Place a towel between your skin and the bag  Leave the ice on for 15-20 minutes, 3-4 times a day for the first 2-3 days. 210 After that, you can switch between ice and heat packs.  Ask your  doctor about back exercises or massage.  Avoid feeling anxious or stressed.  Find good ways to deal with stress, such as exercise.  Get Help Right Way If:  Your pain does not go away with rest or medicine.  Your pain does not go away in 1 week.  You have new problems.  You do not feel well.  The pain spreads into your legs.  You cannot control when you poop (bowel movement) or pee (urinate)  You feel sick to your stomach (nauseous) or throw up (vomit)  You have belly (abdominal) pain.  You feel like you may pass out (faint).  If you develop a fever.  Make Sure you:  Understand these instructions.  Will watch your condition  Will get help right away if you are not doing well or get worse.  Your e-visit answers were reviewed by a board certified advanced clinical practitioner to complete your personal care plan.  Depending on the condition, your plan could have included both over the counter or prescription medications.  If there is a problem please reply  once you have received a response from your provider.  Your safety is important to [place].  If you have drug allergies check your prescription carefully.  You can use MyChart to ask questions about today's visit, request a non-urgent call back, or ask for a work or school excuse for 24 hours related to this e-Visit. If it has been greater than 24 hours you will need to follow up with your provider, or enter a new e-Visit to address those concerns.  You will get an e-mail in the next two days asking about your experience.  I hope that your e-visit has been valuable and will speed your recovery. Thank you for using e-visits.

## 2018-12-19 NOTE — Procedures (Signed)
   Patient Name: [name], [name] Date: 12/15/2018 Gender: Male D.O.B: 1951/04/14 Age (years): 40 Referring Provider: Fransico Him MD, ABSM Height (inches): 72 Interpreting Physician: Fransico Him MD, ABSM Weight (lbs): 280 RPSGT: Laren Everts BMI: 38 MRN: [id number] Neck Size: 18.00  CLINICAL INFORMATION The patient is referred for a CPAP titration to treat sleep apnea.  SLEEP STUDY TECHNIQUE As per the AASM Manual for the Scoring of Sleep and Associated Events v2.3 (April 2016) with a hypopnea requiring 4% desaturations.  The channels recorded and monitored were frontal, central and occipital EEG, electrooculogram (EOG), submentalis EMG (chin), nasal and oral airflow, thoracic and abdominal wall motion, anterior tibialis EMG, snore microphone, electrocardiogram, and pulse oximetry. Continuous positive airway pressure (CPAP) was initiated at the beginning of the study and titrated to treat sleep-disordered breathing.  MEDICATIONS Medications self-administered by patient taken the night of the study : N/A  TECHNICIAN COMMENTS Comments added by technician: NONE Comments added by scorer: N/A  RESPIRATORY PARAMETERS Optimal PAP Pressure (cm): N/A  AHI at Optimal Pressure (/hr):N/A Overall Minimal O2 (%):81.0  Supine % at Optimal Pressure (%): N/A Minimal O2 at Optimal Pressure (%): N/A   SLEEP ARCHITECTURE The study was initiated at 10:31:10 PM and ended at 5:07:51 AM.  Sleep onset time was 6.6 minutes and the sleep efficiency was 70.1%%. The total sleep time was 278 minutes.  The patient spent 10.4%% of the night in stage N1 sleep, 71.6%% in stage N2 sleep, 0.0%% in stage N3 and 18% in REM.Stage REM latency was 32.5 minutes  Wake after sleep onset was 112.1. Alpha intrusion was absent. Supine sleep was 60.79%.  CARDIAC DATA The 2 lead EKG demonstrated sinus rhythm. The mean heart rate was 59.0 beats per minute. Other EKG findings include: None.  LEG MOVEMENT  DATA The total Periodic Limb Movements of Sleep (PLMS) were 0. The PLMS index was 0.0. A PLMS index of <15 is considered normal in adults.  IMPRESSIONS - The optimal PAP pressure could not be obtained due to ongoing respiratory events.  - Central sleep apnea was not noted during this titration (CAI = 3.5/h). - Moderate oxygen desaturations were observed during this titration (min O2 = 81.0%). - The patient snored with loud snoring volume during this titration study. - No cardiac abnormalities were observed during this study. - Clinically significant periodic limb movements were not noted during this study. Arousals associated with PLMs were rare.  DIAGNOSIS - Obstructive Sleep Apnea (327.23 [G47.33 ICD-10])  RECOMMENDATIONS - BiPAP titration in lab. - Avoid alcohol, sedatives and other CNS depressants that may worsen sleep apnea and disrupt normal sleep architecture. - Sleep hygiene should be reviewed to assess factors that may improve sleep quality. - Weight management and regular exercise should be initiated or continued.  [Electronically signed] 12/19/2018 09:01 PM  Fransico Him MD, ABSM Diplomate, American Board of Sleep Medicine

## 2018-12-29 DIAGNOSIS — I219 Acute myocardial infarction, unspecified: Secondary | ICD-10-CM

## 2018-12-29 HISTORY — DX: Acute myocardial infarction, unspecified: I21.9

## 2018-12-30 ENCOUNTER — Other Ambulatory Visit: Payer: Self-pay

## 2018-12-30 ENCOUNTER — Encounter (HOSPITAL_COMMUNITY)
Admission: EM | Disposition: A | Discharge status: Home / Self Care | Payer: Self-pay | Source: Home / Self Care | Attending: Interventional Cardiology

## 2018-12-30 ENCOUNTER — Emergency Department (HOSPITAL_COMMUNITY): Payer: Medicare Other

## 2018-12-30 ENCOUNTER — Encounter: Payer: Self-pay | Admitting: Interventional Cardiology

## 2018-12-30 ENCOUNTER — Encounter (HOSPITAL_COMMUNITY): Payer: Self-pay | Admitting: Emergency Medicine

## 2018-12-30 ENCOUNTER — Inpatient Hospital Stay (HOSPITAL_COMMUNITY)
Admission: EM | Admit: 2018-12-30 | Discharge: 2019-01-01 | DRG: 247 | Disposition: A | Discharge status: Home / Self Care | Payer: Medicare Other | Attending: Interventional Cardiology | Admitting: Interventional Cardiology

## 2018-12-30 DIAGNOSIS — G4733 Obstructive sleep apnea (adult) (pediatric): Secondary | ICD-10-CM | POA: Diagnosis not present

## 2018-12-30 DIAGNOSIS — Z96643 Presence of artificial hip joint, bilateral: Secondary | ICD-10-CM | POA: Diagnosis present

## 2018-12-30 DIAGNOSIS — I2111 ST elevation (STEMI) myocardial infarction involving right coronary artery: Secondary | ICD-10-CM

## 2018-12-30 DIAGNOSIS — Z6837 Body mass index (BMI) 37.0-37.9, adult: Secondary | ICD-10-CM

## 2018-12-30 DIAGNOSIS — E039 Hypothyroidism, unspecified: Secondary | ICD-10-CM | POA: Diagnosis present

## 2018-12-30 DIAGNOSIS — I2582 Chronic total occlusion of coronary artery: Secondary | ICD-10-CM | POA: Diagnosis present

## 2018-12-30 DIAGNOSIS — I25750 Atherosclerosis of native coronary artery of transplanted heart with unstable angina: Secondary | ICD-10-CM | POA: Diagnosis not present

## 2018-12-30 DIAGNOSIS — I10 Essential (primary) hypertension: Secondary | ICD-10-CM | POA: Diagnosis present

## 2018-12-30 DIAGNOSIS — I480 Paroxysmal atrial fibrillation: Secondary | ICD-10-CM | POA: Diagnosis present

## 2018-12-30 DIAGNOSIS — Z833 Family history of diabetes mellitus: Secondary | ICD-10-CM | POA: Diagnosis not present

## 2018-12-30 DIAGNOSIS — Z79890 Hormone replacement therapy (postmenopausal): Secondary | ICD-10-CM | POA: Diagnosis not present

## 2018-12-30 DIAGNOSIS — I2119 ST elevation (STEMI) myocardial infarction involving other coronary artery of inferior wall: Secondary | ICD-10-CM | POA: Diagnosis not present

## 2018-12-30 DIAGNOSIS — I25110 Atherosclerotic heart disease of native coronary artery with unstable angina pectoris: Secondary | ICD-10-CM

## 2018-12-30 DIAGNOSIS — N400 Enlarged prostate without lower urinary tract symptoms: Secondary | ICD-10-CM | POA: Diagnosis present

## 2018-12-30 DIAGNOSIS — I213 ST elevation (STEMI) myocardial infarction of unspecified site: Secondary | ICD-10-CM

## 2018-12-30 DIAGNOSIS — R079 Chest pain, unspecified: Secondary | ICD-10-CM | POA: Diagnosis not present

## 2018-12-30 DIAGNOSIS — R001 Bradycardia, unspecified: Secondary | ICD-10-CM | POA: Diagnosis present

## 2018-12-30 DIAGNOSIS — Z96653 Presence of artificial knee joint, bilateral: Secondary | ICD-10-CM | POA: Diagnosis present

## 2018-12-30 DIAGNOSIS — Z79899 Other long term (current) drug therapy: Secondary | ICD-10-CM

## 2018-12-30 DIAGNOSIS — Z8249 Family history of ischemic heart disease and other diseases of the circulatory system: Secondary | ICD-10-CM | POA: Diagnosis not present

## 2018-12-30 DIAGNOSIS — E785 Hyperlipidemia, unspecified: Secondary | ICD-10-CM | POA: Diagnosis present

## 2018-12-30 DIAGNOSIS — Z955 Presence of coronary angioplasty implant and graft: Secondary | ICD-10-CM

## 2018-12-30 HISTORY — PX: CORONARY/GRAFT ACUTE MI REVASCULARIZATION: CATH118305

## 2018-12-30 HISTORY — PX: LEFT HEART CATH AND CORONARY ANGIOGRAPHY: CATH118249

## 2018-12-30 LAB — COMPREHENSIVE METABOLIC PANEL
ALT: 32 U/L (ref 0–44)
AST: 29 U/L (ref 15–41)
Albumin: 4.2 g/dL (ref 3.5–5.0)
Alkaline Phosphatase: 75 U/L (ref 38–126)
Anion gap: 13 (ref 5–15)
BUN: 16 mg/dL (ref 8–23)
CO2: 22 mmol/L (ref 22–32)
Calcium: 9.1 mg/dL (ref 8.9–10.3)
Chloride: 106 mmol/L (ref 98–111)
Creatinine, Ser: 0.95 mg/dL (ref 0.61–1.24)
GFR calc Af Amer: >60 mL/min (ref >60)
GFR calc non Af Amer: >60 mL/min (ref >60)
Glucose, Bld: 131 mg/dL — ABNORMAL HIGH (ref 70–99)
Potassium: 4.1 mmol/L (ref 3.5–5.1)
Sodium: 141 mmol/L (ref 135–145)
Total Bilirubin: 0.8 mg/dL (ref 0.3–1.2)
Total Protein: 6.9 g/dL (ref 6.5–8.1)

## 2018-12-30 LAB — CBC WITH DIFFERENTIAL/PLATELET
Abs Immature Granulocytes: 0.03 10*3/uL (ref 0.00–0.07)
Basophils Absolute: 0 10*3/uL (ref 0.0–0.1)
Basophils Relative: 1 %
Eosinophils Absolute: 0.1 10*3/uL (ref 0.0–0.5)
Eosinophils Relative: 2 %
HCT: 44.9 % (ref 39.0–52.0)
Hemoglobin: 14.6 g/dL (ref 13.0–17.0)
Immature Granulocytes: 1 %
Lymphocytes Relative: 28 %
Lymphs Abs: 1.8 10*3/uL (ref 0.7–4.0)
MCH: 29.3 pg (ref 26.0–34.0)
MCHC: 32.5 g/dL (ref 30.0–36.0)
MCV: 90 fL (ref 80.0–100.0)
Monocytes Absolute: 0.8 10*3/uL (ref 0.1–1.0)
Monocytes Relative: 12 %
Neutro Abs: 3.7 10*3/uL (ref 1.7–7.7)
Neutrophils Relative %: 56 %
Platelets: 159 10*3/uL (ref 150–400)
RBC: 4.99 MIL/uL (ref 4.22–5.81)
RDW: 12.5 % (ref 11.5–15.5)
WBC: 6.5 10*3/uL (ref 4.0–10.5)
nRBC: 0 % (ref 0.0–0.2)

## 2018-12-30 LAB — LIPID PANEL
Cholesterol: 198 mg/dL (ref 0–200)
HDL: 41 mg/dL (ref >40)
LDL Cholesterol: 93 mg/dL (ref 0–99)
Total CHOL/HDL Ratio: 4.8 RATIO
Triglycerides: 322 mg/dL — ABNORMAL HIGH (ref <150)
VLDL: 64 mg/dL — ABNORMAL HIGH (ref 0–40)

## 2018-12-30 LAB — PROTIME-INR
INR: 0.94
Prothrombin Time: 12.5 seconds (ref 11.4–15.2)

## 2018-12-30 LAB — TROPONIN I: Troponin I: <0.03 ng/mL (ref <0.03)

## 2018-12-30 LAB — APTT: aPTT: 30 seconds (ref 24–36)

## 2018-12-30 SURGERY — CORONARY/GRAFT ACUTE MI REVASCULARIZATION
Anesthesia: LOCAL

## 2018-12-30 MED ORDER — LOSARTAN POTASSIUM 50 MG PO TABS
50.0000 mg | ORAL_TABLET | Freq: Every day | ORAL | Status: DC
  Administered 2018-12-31 – 2019-01-01 (×2): 50 mg via ORAL
  Filled 2018-12-30 (×2): qty 1

## 2018-12-30 MED ORDER — VERAPAMIL HCL 2.5 MG/ML IV SOLN
INTRAVENOUS | Status: DC | PRN
  Administered 2018-12-30: 10 mL via INTRA_ARTERIAL

## 2018-12-30 MED ORDER — SODIUM CHLORIDE 0.9% FLUSH: 3.0000 mL | INTRAVENOUS | Status: DC | PRN

## 2018-12-30 MED ORDER — HYDRALAZINE HCL 20 MG/ML IJ SOLN: 5.0000 mg | INTRAMUSCULAR | Status: AC | PRN

## 2018-12-30 MED ORDER — ASPIRIN 81 MG PO CHEW
324.0000 mg | CHEWABLE_TABLET | Freq: Once | ORAL | Status: DC
  Filled 2018-12-30: qty 4

## 2018-12-30 MED ORDER — ATORVASTATIN CALCIUM 80 MG PO TABS
80.0000 mg | ORAL_TABLET | Freq: Every day | ORAL | Status: DC
  Administered 2018-12-30 – 2018-12-31 (×2): 80 mg via ORAL
  Filled 2018-12-30 (×2): qty 1

## 2018-12-30 MED ORDER — HEPARIN SODIUM (PORCINE) 1000 UNIT/ML IJ SOLN
INTRAMUSCULAR | Status: DC | PRN
  Administered 2018-12-30: 4000 [IU] via INTRAVENOUS
  Administered 2018-12-30: 1500 [IU] via INTRAVENOUS
  Administered 2018-12-30 (×2): 3000 [IU] via INTRAVENOUS
  Administered 2018-12-30: 2000 [IU] via INTRAVENOUS
  Administered 2018-12-30: 3000 [IU] via INTRAVENOUS

## 2018-12-30 MED ORDER — METOPROLOL SUCCINATE ER 25 MG PO TB24
25.0000 mg | ORAL_TABLET | Freq: Every day | ORAL | Status: DC
  Administered 2018-12-30 – 2019-01-01 (×3): 25 mg via ORAL
  Filled 2018-12-30 (×3): qty 1

## 2018-12-30 MED ORDER — LEVOTHYROXINE SODIUM 150 MCG PO TABS
150.0000 ug | ORAL_TABLET | Freq: Every day | ORAL | Status: DC
  Administered 2018-12-31 – 2019-01-01 (×2): 150 ug via ORAL
  Filled 2018-12-30: qty 2
  Filled 2018-12-30: qty 1
  Filled 2018-12-30: qty 2
  Filled 2018-12-30: qty 1

## 2018-12-30 MED ORDER — TICAGRELOR 90 MG PO TABS
ORAL_TABLET | ORAL | Status: AC
  Filled 2018-12-30: qty 1

## 2018-12-30 MED ORDER — ASPIRIN 81 MG PO CHEW
81.0000 mg | CHEWABLE_TABLET | Freq: Every day | ORAL | Status: DC
  Administered 2018-12-31 – 2019-01-01 (×2): 81 mg via ORAL
  Filled 2018-12-30 (×2): qty 1

## 2018-12-30 MED ORDER — IOHEXOL 350 MG/ML SOLN
INTRAVENOUS | Status: DC | PRN
  Administered 2018-12-30: 305 mL via INTRA_ARTERIAL

## 2018-12-30 MED ORDER — HEPARIN (PORCINE) IN NACL 1000-0.9 UT/500ML-% IV SOLN
INTRAVENOUS | Status: DC | PRN
  Administered 2018-12-30 (×2): 500 mL

## 2018-12-30 MED ORDER — ACETAMINOPHEN 325 MG PO TABS: 650.0000 mg | ORAL_TABLET | ORAL | Status: DC | PRN

## 2018-12-30 MED ORDER — FENTANYL CITRATE (PF) 100 MCG/2ML IJ SOLN
INTRAMUSCULAR | Status: AC
  Filled 2018-12-30: qty 2

## 2018-12-30 MED ORDER — HEPARIN (PORCINE) IN NACL 1000-0.9 UT/500ML-% IV SOLN
INTRAVENOUS | Status: AC
  Filled 2018-12-30: qty 1000

## 2018-12-30 MED ORDER — NITROGLYCERIN IN D5W 200-5 MCG/ML-% IV SOLN
INTRAVENOUS | Status: AC | PRN
  Administered 2018-12-30: 10 ug/min via INTRAVENOUS

## 2018-12-30 MED ORDER — ONDANSETRON HCL 4 MG/2ML IJ SOLN: 4.0000 mg | Freq: Four times a day (QID) | INTRAMUSCULAR | Status: DC | PRN

## 2018-12-30 MED ORDER — LABETALOL HCL 5 MG/ML IV SOLN: 10.0000 mg | INTRAVENOUS | Status: AC | PRN

## 2018-12-30 MED ORDER — HEPARIN SODIUM (PORCINE) 5000 UNIT/ML IJ SOLN
5000.0000 [IU] | Freq: Three times a day (TID) | INTRAMUSCULAR | Status: DC
  Administered 2018-12-31 – 2019-01-01 (×4): 5000 [IU] via SUBCUTANEOUS
  Filled 2018-12-30 (×4): qty 1

## 2018-12-30 MED ORDER — VERAPAMIL HCL 2.5 MG/ML IV SOLN
INTRAVENOUS | Status: AC
  Filled 2018-12-30: qty 2

## 2018-12-30 MED ORDER — SODIUM CHLORIDE 0.9 % IV SOLN
INTRAVENOUS | Status: DC
  Administered 2018-12-30: 18:00:00 via INTRAVENOUS

## 2018-12-30 MED ORDER — SODIUM CHLORIDE 0.9 % WEIGHT BASED INFUSION
0.7500 mL/kg/h | INTRAVENOUS | Status: AC
  Administered 2018-12-30: 0.75 mL/kg/h via INTRAVENOUS

## 2018-12-30 MED ORDER — NITROGLYCERIN IN D5W 200-5 MCG/ML-% IV SOLN
10.0000 ug/min | INTRAVENOUS | Status: DC
  Administered 2018-12-30: 10 ug/min via INTRAVENOUS

## 2018-12-30 MED ORDER — TICAGRELOR 90 MG PO TABS
ORAL_TABLET | ORAL | Status: DC | PRN
  Administered 2018-12-30: 180 mg via ORAL

## 2018-12-30 MED ORDER — HEPARIN SODIUM (PORCINE) 1000 UNIT/ML IJ SOLN
INTRAMUSCULAR | Status: AC
  Filled 2018-12-30: qty 1

## 2018-12-30 MED ORDER — LIDOCAINE HCL (PF) 1 % IJ SOLN
INTRAMUSCULAR | Status: AC
  Filled 2018-12-30: qty 30

## 2018-12-30 MED ORDER — SODIUM CHLORIDE 0.9 % IV SOLN: 250.0000 mL | INTRAVENOUS | Status: DC | PRN

## 2018-12-30 MED ORDER — SODIUM CHLORIDE 0.9% FLUSH
3.0000 mL | Freq: Two times a day (BID) | INTRAVENOUS | Status: DC
  Administered 2018-12-31 – 2019-01-01 (×3): 3 mL via INTRAVENOUS

## 2018-12-30 MED ORDER — HEPARIN SODIUM (PORCINE) 5000 UNIT/ML IJ SOLN
4000.0000 [IU] | Freq: Once | INTRAMUSCULAR | Status: AC
  Administered 2018-12-30: 4000 [IU] via INTRAVENOUS
  Filled 2018-12-30: qty 1

## 2018-12-30 MED ORDER — TICAGRELOR 90 MG PO TABS
90.0000 mg | ORAL_TABLET | Freq: Two times a day (BID) | ORAL | Status: DC
  Administered 2018-12-31 – 2019-01-01 (×3): 90 mg via ORAL
  Filled 2018-12-30 (×3): qty 1

## 2018-12-30 MED ORDER — NITROGLYCERIN 1 MG/10 ML FOR IR/CATH LAB
INTRA_ARTERIAL | Status: DC | PRN
  Administered 2018-12-30: 200 ug via INTRACORONARY

## 2018-12-30 MED ORDER — MORPHINE SULFATE (PF) 2 MG/ML IV SOLN: 2.0000 mg | INTRAVENOUS | Status: DC | PRN

## 2018-12-30 MED ORDER — LIDOCAINE HCL (PF) 1 % IJ SOLN
INTRAMUSCULAR | Status: DC | PRN
  Administered 2018-12-30: 2 mL via INTRADERMAL

## 2018-12-30 MED ORDER — FENTANYL CITRATE (PF) 100 MCG/2ML IJ SOLN
INTRAMUSCULAR | Status: DC | PRN
  Administered 2018-12-30: 25 ug via INTRAVENOUS
  Administered 2018-12-30: 50 ug via INTRAVENOUS

## 2018-12-30 SURGICAL SUPPLY — 25 items
BALLN SAPPHIRE 2.0X12 (BALLOONS) ×2
BALLN SAPPHIRE 2.5X12 (BALLOONS) ×2
BALLN SAPPHIRE 2.5X15 (BALLOONS) ×2
BALLN SAPPHIRE ~~LOC~~ 3.75X15 (BALLOONS) ×2 IMPLANT
BALLN SAPPHIRE ~~LOC~~ 4.0X10 (BALLOONS) ×2 IMPLANT
BALLOON SAPPHIRE 2.0X12 (BALLOONS) ×1 IMPLANT
BALLOON SAPPHIRE 2.5X12 (BALLOONS) ×1 IMPLANT
BALLOON SAPPHIRE 2.5X15 (BALLOONS) ×1 IMPLANT
CATH INFINITI JR4 5F (CATHETERS) ×2 IMPLANT
CATH LAUNCHER 6FR JR4 (CATHETERS) ×2 IMPLANT
CATH VISTA GUIDE 6FR XB3.5 (CATHETERS) ×2 IMPLANT
DEVICE RAD COMP TR BAND LRG (VASCULAR PRODUCTS) ×2 IMPLANT
GLIDESHEATH SLEND A-KIT 6F 22G (SHEATH) ×2 IMPLANT
GUIDEWIRE INQWIRE 1.5J.035X260 (WIRE) ×1 IMPLANT
INQWIRE 1.5J .035X260CM (WIRE) ×2
KIT ENCORE 26 ADVANTAGE (KITS) ×2 IMPLANT
KIT ESSENTIALS PG (KITS) ×2 IMPLANT
KIT HEART LEFT (KITS) ×2 IMPLANT
PACK CARDIAC CATHETERIZATION (CUSTOM PROCEDURE TRAY) ×2 IMPLANT
SHEATH PROBE COVER 6X72 (BAG) ×2 IMPLANT
STENT SYNERGY DES 3.5X20 (Permanent Stent) ×2 IMPLANT
TRANSDUCER W/STOPCOCK (MISCELLANEOUS) ×2 IMPLANT
TUBING CIL FLEX 10 FLL-RA (TUBING) ×2 IMPLANT
WIRE ASAHI PROWATER 180CM (WIRE) ×2 IMPLANT
WIRE MINAMO 190 (WIRE) ×2 IMPLANT

## 2018-12-30 NOTE — H&P (Signed)
Cardiology Admission History and Physical:   Patient ID: [name] CURRENT MRN: [id number]; DOB: 1951-07-06   Admission date: 12/30/2018  Primary Care Provider: Binnie Rail, MD Primary Cardiologist: Fransico Him Primary Electrophysiologist:  None  Chief Complaint: Chest discomfort with EKG changes consistent with inferior ST elevation MI  Patient Profile:   [name] is a 68 y.o. male with history of obesity, recently diagnosed but not yet treated obstructive sleep apnea, essential hypertension, and prior history of PAF who presents to the emergency room with prolonged chest pain commencing approximately 1.5 hours prior to arrival.  History of Present Illness:   Mr. [name] states that he suddenly developed chest tightness around 4:45 PM that he graded as a 7/10 in intensity.  He describes the discomfort as a central tightness or pressure.  There is no radiation, dyspnea, or other associated complaint.  Never had prior symptoms of similar nature.  He denies dyspnea, diaphoresis, nausea, vomiting.  He is not diabetic.  Does not smoke cigarettes.  No prior history of myocardial infarction, CHF, aneurysm, or other significant problems.   Past Medical History:  Diagnosis Date  . Arthritis    Knees  . BPH (benign prostatic hypertrophy)   . COLONIC POLYPS, HX OF 2007   clear colo w/o polyps 04/2015: 95yr follow up  . HYPERTENSION   . HYPOTHYROIDISM   . OBESITY   . PAF (paroxysmal atrial fibrillation) ([place]) 10/01/2018    Past Surgical History:  Procedure Laterality Date  . arthroscopic knee surgery     (R) 2003 & (L) 2010  . COLONOSCOPY  04/2015   no polyps ([place])  . LAMINECTOMY  1991  . TONSILLECTOMY  1990   w/ adenoids and uvula  . TONSILLECTOMY    . TOTAL HIP ARTHROPLASTY Right 01/2011   [place]  . TOTAL HIP ARTHROPLASTY Left 04/16/2016   Procedure: TOTAL HIP ARTHROPLASTY ANTERIOR APPROACH;  Surgeon: Gaynelle Arabian, MD;  Location: [facility];  Service: Orthopedics;  Laterality:  Left;  . TOTAL KNEE ARTHROPLASTY  12/27/2012   Procedure: TOTAL KNEE BILATERAL;  Surgeon: Gearlean Alf, MD;  Location: [facility];  Service: Orthopedics;  Laterality: Bilateral;     Medications Prior to Admission: Prior to Admission medications   Medication Sig Start Date End Date Taking? Authorizing Provider  amLODipine (NORVASC) 10 MG tablet Take 1 tablet (10 mg total) by mouth daily. 10/20/18 10/20/19 Yes Turner, Eber Hong, MD  baclofen (LIORESAL) 10 MG tablet Take 1 tablet (10 mg total) by mouth every 8 (eight) hours as needed for muscle spasms. 12/17/18  Yes Withrow, Elyse Jarvis, FNP  levothyroxine (SYNTHROID, LEVOTHROID) 150 MCG tablet TAKE 1 TABLET BY MOUTH EVERY DAY 11/11/18  Yes Burns, Claudina Lick, MD  meloxicam (MOBIC) 15 MG tablet Take 1 tablet (15 mg total) by mouth daily. 10/06/18  Yes Burns, Claudina Lick, MD  Multiple Vitamins-Minerals (MULTIVITAMIN) tablet Take 1 tablet by mouth daily. 05/27/16  Yes Burns, Claudina Lick, MD  chlorpheniramine-HYDROcodone (TUSSIONEX PENNKINETIC ER) 10-8 MG/5ML SUER Take 5 mLs by mouth every 12 (twelve) hours as needed for cough. Patient not taking: Reported on 12/30/2018 10/22/18   Binnie Rail, MD  clotrimazole-betamethasone (LOTRISONE) cream Apply 1 application topically 2 (two) times daily. Patient not taking: Reported on 12/30/2018 02/19/18   Binnie Rail, MD  predniSONE (STERAPRED UNI-PAK 21 TAB) 10 MG (21) TBPK tablet As directed Patient not taking: Reported on 12/30/2018 12/17/18   Benjamine Mola, FNP     Allergies:   No  Known Allergies  Social History:   Social History   Socioeconomic History  . Marital status: Married    Spouse name: Not on file  . Number of children: Not on file  . Years of education: Not on file  . Highest education level: Not on file  Occupational History  . Not on file  Social Needs  . Financial resource strain: Not on file  . Food insecurity:    Worry: Not on file    Inability: Not on file  . Transportation needs:    Medical:  Not on file    Non-medical: Not on file  Tobacco Use  . Smoking status: Never Smoker  . Smokeless tobacco: Never Used  Substance and Sexual Activity  . Alcohol use: Yes    Alcohol/week: 0.0 standard drinks    Comment: rarely, social  . Drug use: No  . Sexual activity: Not on file  Lifestyle  . Physical activity:    Days per week: Not on file    Minutes per session: Not on file  . Stress: Not on file  Relationships  . Social connections:    Talks on phone: Not on file    Gets together: Not on file    Attends religious service: Not on file    Active member of club or organization: Not on file    Attends meetings of clubs or organizations: Not on file    Relationship status: Not on file  . Intimate partner violence:    Fear of current or ex partner: Not on file    Emotionally abused: Not on file    Physically abused: Not on file    Forced sexual activity: Not on file  Other Topics Concern  . Not on file  Social History Narrative   Working part time, contemplating retirement end of 2016   Lives at home with spouse      Exercise: water exercises    Family History: Father had history of coronary bypass surgery and multiple stents. The patient's family history includes ALS in his maternal grandfather; Arthritis in an other family member; Dementia in his mother; Diabetes in his father; Heart disease (age of onset: 2) in his father; Hypertension in his father. There is no history of Colon cancer.    ROS:  Please see the history of present illness.  Has excessive daytime sleepiness.  Recent sleep study was abnormal.  Difficulty waking up from anesthesia according to the wife who is a retired Equities trader.  All other ROS reviewed and negative.     Physical Exam/Data:   Vitals:   12/30/18 1748 12/30/18 1750 12/30/18 1800 12/30/18 1800  BP:  (!) 173/102 (!) 184/109   Pulse: 81 80 75   Resp: 20 (!) 26 18   Temp:      TempSrc:      SpO2: 98% 97% 97%   Weight:   127 kg 127  kg  Height:   6' (1.829 m) 6' (1.829 m)   No intake or output data in the 24 hours ending 12/30/18 1809 Filed Weights   12/30/18 1800 12/30/18 1800  Weight: 127 kg 127 kg   Body mass index is 37.97 kg/m.  General: 6 feet tall, morbidly obese, in no acute distress. HEENT: normal Lymph: no adenopathy Neck: no JVD Endocrine:  No thryomegaly Vascular: No carotid bruits; FA pulses 2+ bilaterally without bruits  Cardiac:  normal S1, S2; RRR; no murmur. Lungs:  clear to auscultation bilaterally, no wheezing, rhonchi or  rales  Abd: soft, nontender, no hepatomegaly  Ext: no edema Musculoskeletal:  No deformities, BUE and BLE strength normal and equal Skin: warm and dry  Neuro:  CNs 2-12 intact, no focal abnormalities noted Psych:  Normal affect    EKG:  The ECG that was done at 1732 today was personally reviewed and demonstrates demonstrates 1 mm of ST elevation MI III, II, aVF, with risks reciprocal changes in V2 and aVL.  Repeat EKG performed at 1606 reveals resolving ST elevation with less reciprocal change.  Normal sinus rhythm is noted.  Relevant CV Studies: An exercise treadmill test performed 10/06/2018 did not reveal evidence of ischemia.  Laboratory Data:  ChemistryNo results for input(s): NA, K, CL, CO2, GLUCOSE, BUN, CREATININE, CALCIUM, GFRNONAA, GFRAA, ANIONGAP in the last 168 hours.  No results for input(s): PROT, ALBUMIN, AST, ALT, ALKPHOS, BILITOT in the last 168 hours. Hematology Recent Labs  Lab 12/30/18 1735  WBC 6.5  RBC 4.99  HGB 14.6  HCT 44.9  MCV 90.0  MCH 29.3  MCHC 32.5  RDW 12.5  PLT 159   Cardiac EnzymesNo results for input(s): TROPONINI in the last 168 hours. No results for input(s): TROPIPOC in the last 168 hours.  BNPNo results for input(s): BNP, PROBNP in the last 168 hours.  DDimer No results for input(s): DDIMER in the last 168 hours.  Radiology/Studies:  No results found.  Assessment and Plan:   1. Acute inferior ST elevation MI,  with slight improvement in symptoms at the time of initial evaluation. 2. Hyperlipidemia.  Not currently treated 3. Essential hypertension, improved. 4. Morbid obesity 5. Untreated obstructive sleep apnea, with severe hypoxia with sedation.  RECOMMENDATIONS:   Emergency coronary angiography and mechanical revascularization as indicated by anatomy.  Procedure and risks including stroke, death, aggravation of myocardial infarction, bleeding, kidney failure, radiation injury, heart failure, were discussed in detail with the patient and wife (a retired Marine scientist) and accepted.   CRITICAL CARE TIME: 40 minutes   Severity of Illness: The appropriate patient status for this patient is INPATIENT. Inpatient status is judged to be reasonable and necessary in order to provide the required intensity of service to ensure the patient's safety. The patient's presenting symptoms, physical exam findings, and initial radiographic and laboratory data in the context of their chronic comorbidities is felt to place them at high risk for further clinical deterioration. Furthermore, it is not anticipated that the patient will be medically stable for discharge from the hospital within 2 midnights of admission. The following factors support the patient status of inpatient.   " The patient's presenting symptoms include chest pain. " The worrisome physical exam findings include none. " The initial radiographic and laboratory data are worrisome because of ST elevation on EKG. " The chronic co-morbidities include severe sleep apnea with hypoxia.   * I certify that at the point of admission it is my clinical judgment that the patient will require inpatient hospital care spanning beyond 2 midnights from the point of admission due to high intensity of service, high risk for further deterioration and high frequency of surveillance required.*    For questions or updates, please contact [place] Please consult  [email or web address] for contact info under        Signed, Sinclair Grooms, MD  12/30/2018 6:09 PM

## 2018-12-30 NOTE — CV Procedure (Signed)
   Acute inferior ST elevation MI with resolving symptoms by the time of arrival in the Cath Lab.  Right radial access via ultrasound guidance.  Total occlusion of the distal circumflex beyond dominant obtuse marginal.  The circumflex distal segment fills by right to left collaterals from the PDA.  There are also left to left collaterals to the distal circumflex beyond the total occlusion  Large PDA contains a ostial/proximal 95% stenosis with TIMI II-III flow.  The mid LAD beyond a large diagonal 1 contains 70% narrowing.  A branch of the large diagonal contains 50% narrowing.  Inferior basal hypokinesis.  EF 70%.  Elevated LVEDP, 20 mmHg.  Successful PTCA and stent ostial to proximal PDA using a 20 x 3.0 Onyx postdilated to 4.0 mm in diameter in the proximal/ostial segment.  TIMI grade III flow was noted.

## 2018-12-30 NOTE — Progress Notes (Signed)
Chaplain responded to STEMI, provided emotional support to patient and wife; escorted wife to [place] and oriented her to location and process.    Please call as needed for ongoing needs.  Luana Shu [phone]     12/30/18 1848  Clinical Encounter Type  Visited With Patient and family together  Visit Type Initial;Other (Comment) (STEMI)  Referral From Care management  Consult/Referral To Chaplain  Spiritual Encounters  Spiritual Needs Emotional  Stress Factors  Patient Stress Factors Health changes  Family Stress Factors Lack of knowledge;Health changes

## 2018-12-30 NOTE — ED Notes (Signed)
Cardiology at bedside.

## 2018-12-30 NOTE — ED Triage Notes (Signed)
Pt reports central CP non-radiating starting 40 minutes ago. Pt reports diaphoresis and light-headed. Pt reports hx of HTN. Pt reports chewing 324 ASA at home

## 2018-12-30 NOTE — Progress Notes (Signed)
Patient having trouble sleeping, requesting some medicine to help him sleep. Cards MD Elson Areas) paged. No new orders have been received.   Ana E Reola Mosher, [place]

## 2018-12-30 NOTE — ED Provider Notes (Signed)
[facility] EMERGENCY DEPARTMENT Provider Note   CSN: [id number] Arrival date & time: 12/30/18  1727     History   Chief Complaint Chief Complaint  Patient presents with  . Chest Pain    HPI [name] is a 68 y.o. male.  The history is provided by the patient.  Chest Pain   This is a new problem. The current episode started 1 to 2 hours ago. The problem occurs constantly. The problem has been gradually improving. The pain is associated with rest. The pain is present in the substernal region. The pain is at a severity of 8/10. The pain is moderate. The quality of the pain is described as pressure-like. The pain does not radiate. Pertinent negatives include no abdominal pain, no back pain, no cough, no diaphoresis, no dizziness, no exertional chest pressure, no fever, no headaches, no leg pain, no palpitations, no shortness of breath, no syncope and no vomiting. He has tried nothing for the symptoms. Risk factors include male gender.  His past medical history is significant for hypertension.  Pertinent negatives for past medical history include no seizures.  His family medical history is significant for CAD.    Past Medical History:  Diagnosis Date  . Arthritis    Knees  . BPH (benign prostatic hypertrophy)   . COLONIC POLYPS, HX OF 2007   clear colo w/o polyps 04/2015: 73yr follow up  . HYPERTENSION   . HYPOTHYROIDISM   . OBESITY   . PAF (paroxysmal atrial fibrillation) ([place]) 10/01/2018    Patient Active Problem List   Diagnosis Date Noted  . Acute ST elevation myocardial infarction (STEMI) of inferior wall ([place]) 12/30/2018  . Viral upper respiratory tract infection 10/22/2018  . Rash and nonspecific skin eruption 02/19/2018  . Sleep disorder 07/02/2017  . Prediabetes 05/11/2017  . Ventral hernia without obstruction or gangrene 05/11/2017  . Umbilical hernia without obstruction or gangrene 05/11/2017  . OA (osteoarthritis) of hip 04/16/2016  .  Pre-syncope 02/22/2016  . Vertigo 02/22/2016  . Knee joint replacement status 01/12/2013  . OA (osteoarthritis) of knee 12/27/2012  . Benign prostatic hyperplasia   . Hypothyroidism 09/04/2009  . Obesity 09/04/2009  . Essential hypertension 09/04/2009  . ARTHRITIS 09/04/2009  . COLONIC POLYPS, HX OF 09/04/2009    Past Surgical History:  Procedure Laterality Date  . arthroscopic knee surgery     (R) 2003 & (L) 2010  . COLONOSCOPY  04/2015   no polyps ([place])  . LAMINECTOMY  1991  . TONSILLECTOMY  1990   w/ adenoids and uvula  . TONSILLECTOMY    . TOTAL HIP ARTHROPLASTY Right 01/2011   [place]  . TOTAL HIP ARTHROPLASTY Left 04/16/2016   Procedure: TOTAL HIP ARTHROPLASTY ANTERIOR APPROACH;  Surgeon: Gaynelle Arabian, MD;  Location: [facility];  Service: Orthopedics;  Laterality: Left;  . TOTAL KNEE ARTHROPLASTY  12/27/2012   Procedure: TOTAL KNEE BILATERAL;  Surgeon: Gearlean Alf, MD;  Location: [facility];  Service: Orthopedics;  Laterality: Bilateral;        Home Medications    Prior to Admission medications   Medication Sig Start Date End Date Taking? Authorizing Provider  amLODipine (NORVASC) 10 MG tablet Take 1 tablet (10 mg total) by mouth daily. 10/20/18 10/20/19 Yes Turner, [place] Hong, MD  baclofen (LIORESAL) 10 MG tablet Take 1 tablet (10 mg total) by mouth every 8 (eight) hours as needed for muscle spasms. 12/17/18  Yes Withrow, Elyse Jarvis, FNP  levothyroxine (SYNTHROID, LEVOTHROID) 150  MCG tablet TAKE 1 TABLET BY MOUTH EVERY DAY 11/11/18  Yes Burns, Claudina Lick, MD  meloxicam (MOBIC) 15 MG tablet Take 1 tablet (15 mg total) by mouth daily. 10/06/18  Yes Burns, Claudina Lick, MD  Multiple Vitamins-Minerals (MULTIVITAMIN) tablet Take 1 tablet by mouth daily. 05/27/16  Yes Burns, Claudina Lick, MD  chlorpheniramine-HYDROcodone (TUSSIONEX PENNKINETIC ER) 10-8 MG/5ML SUER Take 5 mLs by mouth every 12 (twelve) hours as needed for cough. Patient not taking: Reported on 12/30/2018 10/22/18   Binnie Rail, MD    clotrimazole-betamethasone (LOTRISONE) cream Apply 1 application topically 2 (two) times daily. Patient not taking: Reported on 12/30/2018 02/19/18   Binnie Rail, MD  predniSONE (STERAPRED UNI-PAK 21 TAB) 10 MG (21) TBPK tablet As directed Patient not taking: Reported on 12/30/2018 12/17/18   Benjamine Mola, FNP    Family History Family History  Problem Relation Age of Onset  . Heart disease Father 44       CABG age 61  . Hypertension Father   . Diabetes Father   . Dementia Mother        ?ALS vs SNP  . ALS Maternal Grandfather   . Arthritis Other        Parent & grandparents  . Colon cancer Neg Hx     Social History Social History   Tobacco Use  . Smoking status: Never Smoker  . Smokeless tobacco: Never Used  Substance Use Topics  . Alcohol use: Yes    Alcohol/week: 0.0 standard drinks    Comment: rarely, social  . Drug use: No     Allergies   Patient has no known allergies.   Review of Systems Review of Systems  Constitutional: Negative for chills, diaphoresis and fever.  HENT: Negative for ear pain and sore throat.   Eyes: Negative for pain and visual disturbance.  Respiratory: Negative for cough and shortness of breath.   Cardiovascular: Positive for chest pain. Negative for palpitations and syncope.  Gastrointestinal: Negative for abdominal pain and vomiting.  Genitourinary: Negative for dysuria and hematuria.  Musculoskeletal: Negative for arthralgias and back pain.  Skin: Negative for color change and rash.  Neurological: Negative for dizziness, seizures, syncope and headaches.  All other systems reviewed and are negative.    Physical Exam Updated Vital Signs  ED Triage Vitals  Enc Vitals Group     BP 12/30/18 1746 (!) 196/107     Pulse Rate 12/30/18 1746 88     Resp 12/30/18 1746 20     Temp 12/30/18 1746 98.4 F (36.9 C)     Temp Source 12/30/18 1746 Oral     SpO2 12/30/18 1746 95 %     Weight 12/30/18 1800 279 lb 15.8 oz (127 kg)      Height 12/30/18 1800 6' (1.829 m)     Head Circumference --      Peak Flow --      Pain Score 12/30/18 1740 6     Pain Loc --      Pain Edu? --      Excl. in [place]? --     Physical Exam Vitals signs and nursing note reviewed.  Constitutional:      Appearance: He is well-developed.  HENT:     Head: Normocephalic and atraumatic.  Eyes:     Extraocular Movements: Extraocular movements intact.     Conjunctiva/sclera: Conjunctivae normal.     Pupils: Pupils are equal, round, and reactive to light.  Neck:  Musculoskeletal: Normal range of motion and neck supple.  Cardiovascular:     Rate and Rhythm: Normal rate and regular rhythm.     Pulses:          Radial pulses are 2+ on the right side and 2+ on the left side.       Dorsalis pedis pulses are 2+ on the right side and 2+ on the left side.     Heart sounds: No murmur.  Pulmonary:     Effort: Pulmonary effort is normal. No respiratory distress.     Breath sounds: Normal breath sounds. No decreased breath sounds, wheezing or rhonchi.  Abdominal:     Palpations: Abdomen is soft.     Tenderness: There is no abdominal tenderness.  Skin:    General: Skin is warm and dry.     Capillary Refill: Capillary refill takes less than 2 seconds.  Neurological:     Mental Status: He is alert.      ED Treatments / Results  Labs (all labs ordered are listed, but only abnormal results are displayed) Labs Reviewed  CBC WITH DIFFERENTIAL/PLATELET  PROTIME-INR  APTT  COMPREHENSIVE METABOLIC PANEL  TROPONIN I  LIPID PANEL    EKG EKG Interpretation  Date/Time:  Thursday December 30 2018 17:32:13 EST Ventricular Rate:  86 PR Interval:  164 QRS Duration: 84 QT Interval:  350 QTC Calculation: 418 R Axis:   -3 Text Interpretation:  ** Critical Test Result: STEMI Normal sinus rhythm ST elevation consider inferior injury or acute infarct ** ** ACUTE MI / STEMI ** ** Consider right ventricular involvement in acute inferior infarct Abnormal  ECG Confirmed by Sherwood Gambler [phone]) on 12/30/2018 5:36:50 PM   Radiology Dg Chest Port 1 View  Result Date: 12/30/2018 CLINICAL DATA:  Back pain and chest pain today. EXAM: PORTABLE CHEST 1 VIEW COMPARISON:  12/20/2012 FINDINGS: Cardiac silhouette is normal in size. No mediastinal or hilar masses. Clear lungs. No pleural effusion or pneumothorax. Skeletal structures are grossly intact. IMPRESSION: No active disease. Electronically Signed   By: Lajean Manes M.D.   On: 12/30/2018 18:15    Procedures .Critical Care Performed by: Lennice Sites, DO Authorized by: Lennice Sites, DO   Critical care provider statement:    Critical care time (minutes):  35   Critical care time was exclusive of:  Separately billable procedures and treating other patients and teaching time   Critical care was necessary to treat or prevent imminent or life-threatening deterioration of the following conditions:  Cardiac failure   Critical care was time spent personally by me on the following activities:  Development of treatment plan with patient or surrogate, discussions with consultants, examination of patient, ordering and performing treatments and interventions, ordering and review of laboratory studies, ordering and review of radiographic studies and pulse oximetry   I assumed direction of critical care for this patient from another provider in my specialty: no     (including critical care time)  Medications Ordered in ED Medications  0.9 %  sodium chloride infusion ( Intravenous New Bag/Given 12/30/18 1757)  aspirin chewable tablet 324 mg (324 mg Oral Not Given 12/30/18 1750)  Heparin (Porcine) in NaCl 1000-0.9 UT/500ML-% SOLN (500 mLs  Given 12/30/18 1835)  heparin injection 4,000 Units (4,000 Units Intravenous Given 12/30/18 1757)     Initial Impression / Assessment and Plan / ED Course  I have reviewed the triage vital signs and the nursing notes.  Pertinent labs & imaging results that were available  during my care of the patient were reviewed by me and considered in my medical decision making (see chart for details).     [name] is a 68 year old male with history of hypertension who presents the ED with chest pain.  Patient with hypertension upon arrival but otherwise normal vitals.  Patient with chest pain about 1 hour prior to evaluation.  EKG upon arrival shows inferior STEMI.  Patient states that pain has improved since he has taken aspirin.  No prior cardiac history.  Significant cardiac history in the family.  Patient with history of hypertension but denies diabetes, high cholesterol.  Patient had blood work drawn.  Code STEMI was activated.  Cardiology came down to the ED to evaluate the patient.  Patient was given IV heparin bolus and started on heparin drip and taken directly to the Cath Lab for intervention.  Lab work thus far unremarkable.  No significant anemia, electrolyte abnormality.  Troponin pending.  Chest x-ray showed no obvious pneumonia, pneumothorax, no widened mediastinum.  Hemodynamically stable throughout my care and transferred to the cath lab.  This chart was dictated using voice recognition software.  Despite best efforts to proofread,  errors can occur which can change the documentation meaning.   Final Clinical Impressions(s) / ED Diagnoses   Final diagnoses:  ST elevation myocardial infarction (STEMI), unspecified artery [facility])    ED Discharge Orders    None       Lennice Sites, DO 12/30/18 2342

## 2018-12-31 ENCOUNTER — Inpatient Hospital Stay (HOSPITAL_COMMUNITY): Payer: Medicare Other

## 2018-12-31 ENCOUNTER — Encounter (HOSPITAL_COMMUNITY): Payer: Self-pay | Admitting: Interventional Cardiology

## 2018-12-31 DIAGNOSIS — G4733 Obstructive sleep apnea (adult) (pediatric): Secondary | ICD-10-CM

## 2018-12-31 DIAGNOSIS — Z6837 Body mass index (BMI) 37.0-37.9, adult: Secondary | ICD-10-CM

## 2018-12-31 DIAGNOSIS — I2119 ST elevation (STEMI) myocardial infarction involving other coronary artery of inferior wall: Secondary | ICD-10-CM

## 2018-12-31 DIAGNOSIS — E785 Hyperlipidemia, unspecified: Secondary | ICD-10-CM

## 2018-12-31 LAB — CBC
HCT: 38.7 % — ABNORMAL LOW (ref 39.0–52.0)
HCT: 38 % — ABNORMAL LOW (ref 39.0–52.0)
Hemoglobin: 13 g/dL (ref 13.0–17.0)
Hemoglobin: 13.1 g/dL (ref 13.0–17.0)
MCH: 30 pg (ref 26.0–34.0)
MCH: 30.4 pg (ref 26.0–34.0)
MCHC: 33.6 g/dL (ref 30.0–36.0)
MCHC: 34.5 g/dL (ref 30.0–36.0)
MCV: 89.4 fL (ref 80.0–100.0)
MCV: 88.2 fL (ref 80.0–100.0)
Platelets: 156 10*3/uL (ref 150–400)
Platelets: 148 10*3/uL — ABNORMAL LOW (ref 150–400)
RBC: 4.33 MIL/uL (ref 4.22–5.81)
RBC: 4.31 MIL/uL (ref 4.22–5.81)
RDW: 12.5 % (ref 11.5–15.5)
RDW: 12.5 % (ref 11.5–15.5)
WBC: 6.3 10*3/uL (ref 4.0–10.5)
WBC: 6.2 10*3/uL (ref 4.0–10.5)
nRBC: 0 % (ref 0.0–0.2)
nRBC: 0 % (ref 0.0–0.2)

## 2018-12-31 LAB — HEMOGLOBIN A1C
Hgb A1c MFr Bld: 5.4 % (ref 4.8–5.6)
Mean Plasma Glucose: 108.28 mg/dL

## 2018-12-31 LAB — POCT I-STAT, CHEM 8
BUN: 16 mg/dL (ref 8–23)
Calcium, Ion: 1.2 mmol/L (ref 1.15–1.40)
Chloride: 105 mmol/L (ref 98–111)
Creatinine, Ser: 0.7 mg/dL (ref 0.61–1.24)
Glucose, Bld: 174 mg/dL — ABNORMAL HIGH (ref 70–99)
HCT: 40 % (ref 39.0–52.0)
Hemoglobin: 13.6 g/dL (ref 13.0–17.0)
Potassium: 3.7 mmol/L (ref 3.5–5.1)
Sodium: 142 mmol/L (ref 135–145)
TCO2: 24 mmol/L (ref 22–32)

## 2018-12-31 LAB — CREATININE, SERUM
Creatinine, Ser: 0.82 mg/dL (ref 0.61–1.24)
GFR calc Af Amer: >60 mL/min (ref >60)
GFR calc non Af Amer: >60 mL/min (ref >60)

## 2018-12-31 LAB — POCT ACTIVATED CLOTTING TIME
Activated Clotting Time: 246 seconds
Activated Clotting Time: 246 seconds
Activated Clotting Time: 279 seconds
Activated Clotting Time: 279 seconds

## 2018-12-31 LAB — ECHOCARDIOGRAM COMPLETE
Height: 72 in
Weight: 4480 oz

## 2018-12-31 LAB — TROPONIN I
Troponin I: 0.08 ng/mL (ref <0.03)
Troponin I: 0.59 ng/mL (ref <0.03)
Troponin I: 3.57 ng/mL (ref <0.03)

## 2018-12-31 LAB — BASIC METABOLIC PANEL
Anion gap: 9 (ref 5–15)
BUN: 11 mg/dL (ref 8–23)
CO2: 22 mmol/L (ref 22–32)
Calcium: 8.9 mg/dL (ref 8.9–10.3)
Chloride: 109 mmol/L (ref 98–111)
Creatinine, Ser: 0.78 mg/dL (ref 0.61–1.24)
GFR calc Af Amer: >60 mL/min (ref >60)
GFR calc non Af Amer: >60 mL/min (ref >60)
Glucose, Bld: 117 mg/dL — ABNORMAL HIGH (ref 70–99)
Potassium: 3.9 mmol/L (ref 3.5–5.1)
Sodium: 140 mmol/L (ref 135–145)

## 2018-12-31 LAB — LIPID PANEL
Cholesterol: 167 mg/dL (ref 0–200)
HDL: 39 mg/dL — ABNORMAL LOW (ref >40)
LDL Cholesterol: 111 mg/dL — ABNORMAL HIGH (ref 0–99)
Total CHOL/HDL Ratio: 4.3 RATIO
Triglycerides: 83 mg/dL (ref <150)
VLDL: 17 mg/dL (ref 0–40)

## 2018-12-31 LAB — MRSA PCR SCREENING: MRSA by PCR: NEGATIVE

## 2018-12-31 LAB — TSH: TSH: 3.809 u[IU]/mL (ref 0.350–4.500)

## 2018-12-31 MED ORDER — PERFLUTREN LIPID MICROSPHERE
1.0000 mL | INTRAVENOUS | Status: AC | PRN
  Administered 2018-12-31: 4 mL via INTRAVENOUS
  Filled 2018-12-31: qty 10

## 2018-12-31 MED ORDER — TRAZODONE HCL 50 MG PO TABS
50.0000 mg | ORAL_TABLET | Freq: Every evening | ORAL | Status: DC | PRN
  Administered 2018-12-31 (×2): 50 mg via ORAL
  Filled 2018-12-31 (×2): qty 1

## 2018-12-31 NOTE — Progress Notes (Signed)
I responded to a [place] to provide Advance Directive information for the patient. I visited the patient's room with his wife, son, and grandchildren present. I gave a brief overview of the AD and left a copy for the patient to complete at a later time. I provided spiritual support by sharing words of encouragement. I shared that the Chaplain is available for additional support as needed or requested.    12/31/18 1046  Clinical Encounter Type  Visited With Patient and family together  Visit Type Spiritual support  Referral From Nurse  Consult/Referral To Chaplain  Spiritual Encounters  Spiritual Needs Prayer;Literature  Stress Factors  Patient Stress Factors None identified  Family Stress Factors None identified    Chaplain Dr Redgie Grayer

## 2018-12-31 NOTE — Progress Notes (Signed)
RT placed pt on CPAP dream station for the night. Pt set on auto titrate 12 high 6 low with no O2 blended in. Pt tolerating well. RT will continue to monitor.

## 2018-12-31 NOTE — Progress Notes (Signed)
CARDIAC REHAB PHASE I   Educated Pt and spouse on MI booklet, exercise guidelines, restrictions, nutrition, incision care, and CRPII. Referred to CRPII [place]. Brilinta and NTG use discussed with Pt. Pt was responsive.   [place], ACSM CEP  2:31 PM 12/31/2018

## 2018-12-31 NOTE — Plan of Care (Signed)
  Problem: Education: Goal: Understanding of CV disease, CV risk reduction, and recovery process will improve Outcome: Progressing   Problem: Activity: Goal: Ability to return to baseline activity level will improve Outcome: Progressing   Problem: Cardiovascular: Goal: Ability to achieve and maintain adequate cardiovascular perfusion will improve Outcome: Progressing   Problem: Clinical Measurements: Goal: Ability to maintain clinical measurements within normal limits will improve Outcome: Progressing

## 2018-12-31 NOTE — Progress Notes (Signed)
CRITICAL VALUE ALERT  Critical Value: Troponin 3.57   Date & Time Notied: 12/31/18 1102  Provider Notified: Dr. Claiborne Billings  Orders Received/Actions taken: No new orders, patient is post stemi and value is expected. Will continue to monitor the trend.

## 2018-12-31 NOTE — Care Management (Signed)
Brilinta benefits check sent and pending. CM team will follow to discuss est monthly copay amount and provide the Brilinta card once cost has been determined.   Maud Deed. Gay BSN, NCM-BC, ACM-RN

## 2018-12-31 NOTE — Progress Notes (Signed)
CARDIAC REHAB PHASE I   PRE:  Rate/Rhythm: 57 SR  BP:  Sitting: 158/90      SaO2: 97% RA  MODE:  Ambulation: 295 ft   POST:  Rate/Rhythm: 68 Sr  BP:  Sitting: 156/87      SaO2: 97% RA  Pt ambulated 295 ft. with one assist. Pt denied SOB or chest pain. Pt stated his back felt stiff and had slight pain while walking. Pt was with family and friends and requested education after lunch. Will see Pt after lunch for education as time allows.   [id number]  Toast, ACSM CEP  11:03 AM 12/31/2018

## 2018-12-31 NOTE — Progress Notes (Signed)
  Echocardiogram 2D Echocardiogram has been performed with Definity.  [name] 12/31/2018, 2:27 PM

## 2018-12-31 NOTE — Progress Notes (Signed)
Progress Note  Patient Name: [name] Date of Encounter: 12/31/2018  Primary Cardiologist: Dr. Radford Pax  Subjective   No recurrent chest pain; ambulating without discomfort with cardiac rehab.  Inpatient Medications    Scheduled Meds: . aspirin  81 mg Oral Daily  . atorvastatin  80 mg Oral q1800  . heparin  5,000 Units Subcutaneous Q8H  . levothyroxine  150 mcg Oral Daily  . losartan  50 mg Oral Daily  . metoprolol succinate  25 mg Oral Daily  . sodium chloride flush  3 mL Intravenous Q12H  . ticagrelor  90 mg Oral BID   Continuous Infusions: . sodium chloride    . nitroGLYCERIN Stopped (12/31/18 0741)   PRN Meds: sodium chloride, acetaminophen, morphine injection, ondansetron (ZOFRAN) IV, sodium chloride flush, traZODone   Vital Signs    Vitals:   12/31/18 0815 12/31/18 0900 12/31/18 0925 12/31/18 1000  BP: (!) 151/97 (!) 165/96 (!) 147/87 (!) 145/81  Pulse: (!) 53 (!) 54 (!) 57 (!) 56  Resp: 18 20 (!) 21 15  Temp:      TempSrc:      SpO2: 98% 97% 98% 99%  Weight:      Height:        Intake/Output Summary (Last 24 hours) at 12/31/2018 1047 Last data filed at 12/31/2018 0900 Gross per 24 hour  Intake 1232.49 ml  Output 1675 ml  Net -442.51 ml    I/O since admission: -442  Filed Weights   12/30/18 1800 12/30/18 1800  Weight: 127 kg 127 kg    Telemetry    Sinus rhythm  - Personally Reviewed  ECG    12/31/2018 ECG (independently read by me): Sinus bradycardia at 50 bpm with resolution of prior ST segment changes.  12/30/2018 ECG (independently read by me): Normal sinus rhythm at 86 bpm.  1 mm ST elevation inferior leads with mild J-point elevation V5 and V6.  Physical Exam    BP (!) 145/81   Pulse (!) 56   Temp 98 F (36.7 C) (Oral)   Resp 15   Ht 6' (1.829 m)   Wt 127 kg   SpO2 99%   BMI 37.97 kg/m  General: Alert, oriented, no distress.  Skin: normal turgor, no rashes, warm and dry HEENT: Normocephalic, atraumatic. Pupils equal round  and reactive to light; sclera anicteric; extraocular muscles intact; Nose without nasal septal hypertrophy Mouth/Parynx benign; Mallinpatti scale 3 Neck: Thick neck; No JVD, no carotid bruits; normal carotid upstroke Lungs: clear to ausculatation and percussion; no wheezing or rales Chest wall: without tenderness to palpitation Heart: PMI not displaced, RRR, s1 s2 normal, 1/6 systolic murmur, no diastolic murmur, no rubs, gallops, thrills, or heaves Abdomen: soft, nontender; no hepatosplenomehaly, BS+; abdominal aorta nontender and not dilated by palpation. Back: no CVA tenderness Pulses 2+ right radial cath site stable Musculoskeletal: full range of motion, normal strength, no joint deformities Extremities: no clubbing cyanosis or edema, Homan's sign negative  Neurologic: grossly nonfocal; Cranial nerves grossly wnl Psychologic: Normal mood and affect   Labs    Chemistry Recent Labs  Lab 12/30/18 1735 12/30/18 2137 12/31/18 0251  NA 141  --  140  K 4.1  --  3.9  CL 106  --  109  CO2 22  --  22  GLUCOSE 131*  --  117*  BUN 16  --  11  CREATININE 0.95 0.82 0.78  CALCIUM 9.1  --  8.9  PROT 6.9  --   --  ALBUMIN 4.2  --   --   AST 29  --   --   ALT 32  --   --   ALKPHOS 75  --   --   BILITOT 0.8  --   --   GFRNONAA >60 >60 >60  GFRAA >60 >60 >60  ANIONGAP 13  --  9     Hematology Recent Labs  Lab 12/30/18 1735 12/30/18 2137 12/31/18 0251  WBC 6.5 6.3 6.2  RBC 4.99 4.33 4.31  HGB 14.6 13.0 13.1  HCT 44.9 38.7* 38.0*  MCV 90.0 89.4 88.2  MCH 29.3 30.0 30.4  MCHC 32.5 33.6 34.5  RDW 12.5 12.5 12.5  PLT 159 156 148*    Cardiac Enzymes Recent Labs  Lab 12/30/18 1735 12/30/18 2137 12/31/18 0251  TROPONINI <0.03 0.08* 0.59*   No results for input(s): TROPIPOC in the last 168 hours.   BNPNo results for input(s): BNP, PROBNP in the last 168 hours.   DDimer No results for input(s): DDIMER in the last 168 hours.   Lipid Panel     Component Value Date/Time    CHOL 167 12/30/2018 2137   TRIG 83 12/30/2018 2137   HDL 39 (L) 12/30/2018 2137   CHOLHDL 4.3 12/30/2018 2137   VLDL 17 12/30/2018 2137   LDLCALC 111 (H) 12/30/2018 2137   LDLDIRECT 153.1 06/09/2011 0942     Radiology    Dg Chest Port 1 View  Result Date: 12/30/2018 CLINICAL DATA:  Back pain and chest pain today. EXAM: PORTABLE CHEST 1 VIEW COMPARISON:  12/20/2012 FINDINGS: Cardiac silhouette is normal in size. No mediastinal or hilar masses. Clear lungs. No pleural effusion or pneumothorax. Skeletal structures are grossly intact. IMPRESSION: No active disease. Electronically Signed   By: Lajean Manes M.D.   On: 12/30/2018 18:15    Cardiac Studies    Aggressive secondary risk modification: LDL less than 70, check for evidence of glucose intolerance, manage sleep apnea which is been diagnosed but is not currently being treated, blood pressure 130/80 mmHg or less.  Dual antiplatelet therapy with aspirin and Brilinta x12 months.  Phase 2 cardiac rehab.  Cycle cardiac markers.  2D Doppler echocardiogram.  May be able to discharge within 24 to 36 hours.    Intervention      Patient Profile     68 y.o. male admitted December 30, 2018 new onset chest tightness, ECG compatible with early inferior STEMI taken acutely to the cardiac catheterization laboratory.  Assessment & Plan    1.  Day 1 status post inferior STEMI secondary to subtotal stenosis at the ostium of the PDA.  Probable old chronic occlusion of the circumflex.  Moderate LAD and diagonal disease as noted above.  Now on Toprol-XL 25 mg with resting pulse in the 60s.  On aspirin/Brilinta post PCI/DES stent.  Recurrent chest pain symptoms.  On losartan 50 mg daily for ARB post MI and hypertension.  Troponin 3.57  2. Hyperlipidemia LDL 111, now on atorvastatin 80 mg daily.  3.  Obstructive sleep apnea: Recently diagnosed, he had recently undergone CPAP titration but not yet initiated CPAP therapy.  4.  Poor thyroidism  on levothyroxine 150 mcg.  .5  Obesity BMI 38  2D echo Doppler study this afternoon.  Possible transfer out of ICU later today if bed needed.  Signed, Troy Sine, MD, [facility] 12/31/2018, 10:47 AM

## 2019-01-01 MED ORDER — LOSARTAN POTASSIUM 50 MG PO TABS: 50.0000 mg | ORAL_TABLET | Freq: Every day | ORAL | 3 refills | Status: DC

## 2019-01-01 MED ORDER — ATORVASTATIN CALCIUM 80 MG PO TABS: 80.0000 mg | ORAL_TABLET | Freq: Every day | ORAL | 3 refills | Status: DC

## 2019-01-01 MED ORDER — ACETAMINOPHEN 325 MG PO TABS: 650.0000 mg | ORAL_TABLET | ORAL | Status: DC | PRN

## 2019-01-01 MED ORDER — NITROGLYCERIN 0.4 MG SL SUBL: 0.4000 mg | SUBLINGUAL_TABLET | SUBLINGUAL | 2 refills | Status: DC | PRN

## 2019-01-01 MED ORDER — NITROGLYCERIN 0.4 MG SL SUBL: 0.4000 mg | SUBLINGUAL_TABLET | SUBLINGUAL | Status: DC | PRN

## 2019-01-01 MED ORDER — TICAGRELOR 90 MG PO TABS: 90.0000 mg | ORAL_TABLET | Freq: Two times a day (BID) | ORAL | 3 refills | Status: DC

## 2019-01-01 MED ORDER — METOPROLOL SUCCINATE ER 25 MG PO TB24: 25.0000 mg | ORAL_TABLET | Freq: Every day | ORAL | 3 refills | Status: DC

## 2019-01-01 MED ORDER — ASPIRIN 81 MG PO CHEW: 81.0000 mg | CHEWABLE_TABLET | Freq: Every day | ORAL | Status: DC

## 2019-01-01 NOTE — Progress Notes (Signed)
Progress Note  Patient Name: NASON CONRADT Date of Encounter: 01/01/2019  Primary Cardiologist: Dr Radford Pax  Subjective   No CP or dyspnea  Inpatient Medications    Scheduled Meds: . aspirin  81 mg Oral Daily  . atorvastatin  80 mg Oral q1800  . heparin  5,000 Units Subcutaneous Q8H  . levothyroxine  150 mcg Oral Daily  . losartan  50 mg Oral Daily  . metoprolol succinate  25 mg Oral Daily  . sodium chloride flush  3 mL Intravenous Q12H  . ticagrelor  90 mg Oral BID   Continuous Infusions: . sodium chloride    . nitroGLYCERIN Stopped (12/31/18 0741)   PRN Meds: sodium chloride, acetaminophen, morphine injection, ondansetron (ZOFRAN) IV, sodium chloride flush, traZODone   Vital Signs    Vitals:   01/01/19 0500 01/01/19 0600 01/01/19 0745 01/01/19 0750  BP:    (!) 155/96  Pulse: (!) 59     Resp: 15 14  (!) 23  Temp:   98.1 F (36.7 C)   TempSrc:   Oral   SpO2: 96%  94%   Weight:      Height:        Intake/Output Summary (Last 24 hours) at 01/01/2019 0851 Last data filed at 01/01/2019 0745 Gross per 24 hour  Intake 970 ml  Output -  Net 970 ml   Filed Weights   12/30/18 1800 12/30/18 1800  Weight: 127 kg 127 kg    Telemetry    Sinus - Personally Reviewed  Physical Exam   GEN: No acute distress.   Neck: No JVD Cardiac: RRR, no murmurs, rubs, or gallops.  Respiratory: Clear to auscultation bilaterally. GI: Soft, nontender, non-distended  MS: No edema Neuro:  Nonfocal  Psych: Normal affect   Labs    Chemistry Recent Labs  Lab 12/30/18 1735 12/30/18 1842 12/30/18 2137 12/31/18 0251  NA 141 142  --  140  K 4.1 3.7  --  3.9  CL 106 105  --  109  CO2 22  --   --  22  GLUCOSE 131* 174*  --  117*  BUN 16 16  --  11  CREATININE 0.95 0.70 0.82 0.78  CALCIUM 9.1  --   --  8.9  PROT 6.9  --   --   --   ALBUMIN 4.2  --   --   --   AST 29  --   --   --   ALT 32  --   --   --   ALKPHOS 75  --   --   --   BILITOT 0.8  --   --   --   GFRNONAA >60   --  >60 >60  GFRAA >60  --  >60 >60  ANIONGAP 13  --   --  9     Hematology Recent Labs  Lab 12/30/18 1735 12/30/18 1842 12/30/18 2137 12/31/18 0251  WBC 6.5  --  6.3 6.2  RBC 4.99  --  4.33 4.31  HGB 14.6 13.6 13.0 13.1  HCT 44.9 40.0 38.7* 38.0*  MCV 90.0  --  89.4 88.2  MCH 29.3  --  30.0 30.4  MCHC 32.5  --  33.6 34.5  RDW 12.5  --  12.5 12.5  PLT 159  --  156 148*    Cardiac Enzymes Recent Labs  Lab 12/30/18 1735 12/30/18 2137 12/31/18 0251 12/31/18 0936  TROPONINI <0.03 0.08* 0.59* 3.57*    Radiology  Dg Chest Port 1 View  Result Date: 12/30/2018 CLINICAL DATA:  Back pain and chest pain today. EXAM: PORTABLE CHEST 1 VIEW COMPARISON:  12/20/2012 FINDINGS: Cardiac silhouette is normal in size. No mediastinal or hilar masses. Clear lungs. No pleural effusion or pneumothorax. Skeletal structures are grossly intact. IMPRESSION: No active disease. Electronically Signed   By: Lajean Manes M.D.   On: 12/30/2018 18:15    Patient Profile     68 y.o. male admitted December 30, 2018 new onset chest tightness, ECG compatible with early inferior STEMI taken acutely to the cardiac catheterization laboratory.  Assessment & Plan    1 status post inferior myocardial infarction-status post PCI of PDA.  Continue aspirin, Brilinta, statin and metoprolol.  LV function is normal.  2 hypertension-patient's blood pressure is elevated.  We will continue present regimen.  Follow blood pressure at home and we will advance medications as needed.  3 hyperlipidemia-continue statin.  Patient will need lipids and liver checked in 8 weeks.  4 obstructive sleep apnea-follow-up Dr. Radford Pax.  Plan discharge today.  Follow-up with APP 2 weeks.  Follow-up with Dr. Radford Pax in 3 months.  Greater than 30 minutes PA and physician time. D2  For questions or updates, please contact [place] Please consult [email or web address] for contact info under        Signed, Kirk Ruths, MD    01/01/2019, 8:51 AM

## 2019-01-01 NOTE — Care Management (Signed)
Discussed Brilinta cost with patient and wife.  Per benefits check, pt's cost may be up to $395/month until deductible is met.  Pt advised of this and given 30 day free card to have Brilinta filled today.  Pt advised to contact Medicare advocate for estimate of when cost will decrease once this hospital bill is processed.  Also advised patient that [employer] may be able to reduce copay.  Pt and wife verbalize understanding.

## 2019-01-01 NOTE — Discharge Summary (Signed)
Discharge Summary    Patient ID: [name] MRN: [id number]; DOB: 06/12/1951  Admit date: 12/30/2018 Discharge date: 01/01/2019  Primary Care Provider: Binnie Rail, MD  Primary Cardiologist: Dr Radford Pax Primary Electrophysiologist:    Discharge Diagnoses    1 status post inferior STEMI-status post PCI of PDA.    2 hypertension-Continue present regimen.  Follow blood pressure at home and we will advance medications as needed.  3 hyperlipidemia-LDL 111 statin added.  Patient will need lipids and liver checked in 8 weeks.  4 obstructive sleep apnea-follow-up Dr. Radford Pax.  Allergies No Known Allergies  Diagnostic Studies/Procedures    Cath/PCI 12/30/18 Echo 12/31/18 _____________   History of Present Illness     68 y.o. male with history of obesity, recently diagnosed but not yet treated obstructive sleep apnea, essential hypertension, who presented to the emergency room 12/30/18 with prolonged chest pain   Hospital Course      68 y/o male seen by Dr Radford Pax 10/01/18 for sleep apnea. He apparently underwent a urologic procedure (no records in Epic) and was noted to have significant apnea during the procedure with O2 desats into the 40's. Sleep study done in Nov 2019 confirmed sleep apnea. The patient had not started C-pap yet. The patient also had a GXT done Oct 2019 which was negative for ischemia.   He presented to [facility] 12/30/18 after developing SSCP at home. EKG was c/w inferior STEMI. He was taken to the cath lab by Dr Tamala Julian. At cath the patient had a 95% PDA that was felt to be the culprit vessel. This was treated with PCI-DES. Also noted was CTO CFX with collaterals from the PDA on OM1. There was an unsuccessful attempt at opening the CFX.  EF was 60% with inferobasilar HK.  Troponin peaked at 3.57. Echo showed EF 65% with no WMA, grade 1 DD.  The patient was seen on rounds 01/01/19 and felt to be stable for discharge.  _____________  Discharge Vitals Blood pressure (!) 143/95,  pulse 83, temperature 98 F (36.7 C), temperature source Oral, resp. rate (!) 26, height 6' (1.829 m), weight 127 kg, SpO2 94 %.  Filed Weights   12/30/18 1800 12/30/18 1800  Weight: 127 kg 127 kg    Labs & Radiologic Studies    CBC Recent Labs    12/30/18 1735  12/30/18 2137 12/31/18 0251  WBC 6.5  --  6.3 6.2  NEUTROABS 3.7  --   --   --   HGB 14.6   < > 13.0 13.1  HCT 44.9   < > 38.7* 38.0*  MCV 90.0  --  89.4 88.2  PLT 159  --  156 148*   < > = values in this interval not displayed.   Basic Metabolic Panel Recent Labs    12/30/18 1735 12/30/18 1842 12/30/18 2137 12/31/18 0251  NA 141 142  --  140  K 4.1 3.7  --  3.9  CL 106 105  --  109  CO2 22  --   --  22  GLUCOSE 131* 174*  --  117*  BUN 16 16  --  11  CREATININE 0.95 0.70 0.82 0.78  CALCIUM 9.1  --   --  8.9   Liver Function Tests Recent Labs    12/30/18 1735  AST 29  ALT 32  ALKPHOS 75  BILITOT 0.8  PROT 6.9  ALBUMIN 4.2   No results for input(s): LIPASE, AMYLASE in the last 72 hours. Cardiac Enzymes  Recent Labs    12/30/18 2137 12/31/18 0251 12/31/18 0936  TROPONINI 0.08* 0.59* 3.57*   BNP Invalid input(s): POCBNP D-Dimer No results for input(s): DDIMER in the last 72 hours. Hemoglobin A1C Recent Labs    12/31/18 0251  HGBA1C 5.4   Fasting Lipid Panel Recent Labs    12/30/18 2137  CHOL 167  HDL 39*  LDLCALC 111*  TRIG 83  CHOLHDL 4.3   Thyroid Function Tests Recent Labs    12/30/18 2137  TSH 3.809   _____________  Dg Chest Port 1 View  Result Date: 12/30/2018 CLINICAL DATA:  Back pain and chest pain today. EXAM: PORTABLE CHEST 1 VIEW COMPARISON:  12/20/2012 FINDINGS: Cardiac silhouette is normal in size. No mediastinal or hilar masses. Clear lungs. No pleural effusion or pneumothorax. Skeletal structures are grossly intact. IMPRESSION: No active disease. Electronically Signed   By: Lajean Manes M.D.   On: 12/30/2018 18:15   Disposition   Pt is being discharged home  today in good condition.  Follow-up Plans & Appointments    Follow-up Information    Sueanne Margarita, MD Follow up.   Specialty:  Cardiology Why:  Office will contact you Contact information: [address] [place] [address] [phone]          Discharge Instructions    Amb Referral to Cardiac Rehabilitation   Complete by:  As directed    Diagnosis:   STEMI Coronary Stents        Discharge Medications   Allergies as of 01/01/2019   No Known Allergies     Medication List    STOP taking these medications   amLODipine 10 MG tablet Commonly known as:  NORVASC   chlorpheniramine-HYDROcodone 10-8 MG/5ML [place]er Commonly known as:  [facility]   clotrimazole-betamethasone cream Commonly known as:  LOTRISONE   meloxicam 15 MG tablet Commonly known as:  MOBIC   predniSONE 10 MG (21) Tbpk tablet Commonly known as:  STERAPRED UNI-PAK 21 TAB     TAKE these medications   acetaminophen 325 MG tablet Commonly known as:  TYLENOL Take 2 tablets (650 mg total) by mouth every 4 (four) hours as needed for headache or mild pain.   aspirin 81 MG chewable tablet Chew 1 tablet (81 mg total) by mouth daily. Start taking on:  January 02, 2019   atorvastatin 80 MG tablet Commonly known as:  LIPITOR Take 1 tablet (80 mg total) by mouth daily at 6 PM.   baclofen 10 MG tablet Commonly known as:  LIORESAL Take 1 tablet (10 mg total) by mouth every 8 (eight) hours as needed for muscle spasms.   levothyroxine 150 MCG tablet Commonly known as:  SYNTHROID, LEVOTHROID TAKE 1 TABLET BY MOUTH EVERY DAY   losartan 50 MG tablet Commonly known as:  COZAAR Take 1 tablet (50 mg total) by mouth daily. Start taking on:  January 02, 2019   metoprolol succinate 25 MG 24 hr tablet Commonly known as:  TOPROL-XL Take 1 tablet (25 mg total) by mouth daily. Start taking on:  January 02, 2019   multivitamin tablet Take 1 tablet by mouth daily.   nitroGLYCERIN  0.4 MG SL tablet Commonly known as:  NITROSTAT Place 1 tablet (0.4 mg total) under the tongue every 5 (five) minutes as needed for chest pain.   ticagrelor 90 MG Tabs tablet Commonly known as:  BRILINTA Take 1 tablet (90 mg total) by mouth 2 (two) times daily.  Acute coronary syndrome (MI, NSTEMI, STEMI, etc) this admission?: Yes.     AHA/ACC Clinical Performance & Quality Measures: 1. Aspirin prescribed? - Yes 2. ADP Receptor Inhibitor (Plavix/Clopidogrel, Brilinta/Ticagrelor or Effient/Prasugrel) prescribed (includes medically managed patients)? - Yes 3. Beta Blocker prescribed? - Yes 4. High Intensity Statin (Lipitor 40-80mg  or Crestor 20-40mg ) prescribed? - Yes 5. EF assessed during THIS hospitalization? - Yes 6. For EF <40%, was ACEI/ARB prescribed? - Not Applicable (EF >/= 94%) 7. For EF <40%, Aldosterone Antagonist (Spironolactone or Eplerenone) prescribed? - Not Applicable (EF >/= 17%) 8. Cardiac Rehab Phase II ordered (Included Medically managed Patients)? - Yes     Outstanding Labs/Studies    Duration of Discharge Encounter   Greater than 30 minutes including physician time.  Angelena Form, PA-C 01/01/2019, 12:17 PM

## 2019-01-01 NOTE — Discharge Instructions (Signed)
Coronary Angiogram With Stent, Care After °This sheet gives you information about how to care for yourself after your procedure. Your health care provider may also give you more specific instructions. If you have problems or questions, contact your health care provider. °What can I expect after the procedure? °After your procedure, it is common to have: °· Bruising in the area where a small, thin tube (catheter) was inserted. This usually fades within 1-2 weeks. °· Blood collecting in the tissue (hematoma) that may be painful to the touch. It should usually decrease in size and tenderness within 1-2 weeks. °Follow these instructions at home: °Insertion area care °· Do not take baths, swim, or use a hot tub until your health care provider approves. °· You may shower 24-48 hours after the procedure or as directed by your health care provider. °· Follow instructions from your health care provider about how to take care of your incision. Make sure you: °? Wash your hands with soap and water before you change your bandage (dressing). If soap and water are not available, use hand sanitizer. °? Change your dressing as told by your health care provider. °? Leave stitches (sutures), skin glue, or adhesive strips in place. These skin closures may need to stay in place for 2 weeks or longer. If adhesive strip edges start to loosen and curl up, you may trim the loose edges. Do not remove adhesive strips completely unless your health care provider tells you to do that. °· Remove the bandage (dressing) and gently wash the catheter insertion site with plain soap and water. °· Pat the area dry with a clean towel. Do not rub the area, because that may cause bleeding. °· Do not apply powder or lotion to the incision area. °· Check your incision area every day for signs of infection. Check for: °? More redness, swelling, or pain. °? More fluid or blood. °? Warmth. °? Pus or a bad smell. °Activity °· Do not drive for 24 hours if you  were given a medicine to help you relax (sedative). °· Do not lift anything that is heavier than 10 lb (4.5 kg) for 5 days after your procedure or as directed by your health care provider. °· Ask your health care provider when it is okay for you: °? To return to work or school. °? To resume usual physical activities or sports. °? To resume sexual activity. °Eating and drinking ° °· Eat a heart-healthy diet. This should include plenty of fresh fruits and vegetables. °· Avoid the following types of food: °? Food that is high in salt. °? Canned or highly processed food. °? Food that is high in saturated fat or sugar. °? Fried food. °· Limit alcohol intake to no more than 1 drink a day for non-pregnant women and 2 drinks a day for men. One drink equals 12 oz of beer, 5 oz of wine, or 1½ oz of hard liquor. °Lifestyle ° °· Do not use any products that contain nicotine or tobacco, such as cigarettes and e-cigarettes. If you need help quitting, ask your health care provider. °· Take steps to manage and control your weight. °· Get regular exercise. °· Manage your blood pressure. °· Manage other health problems, such as diabetes. °General instructions °· Take over-the-counter and prescription medicines only as told by your health care provider. Blood thinners may be prescribed after your procedure to improve blood flow through the stent. °· If you need an MRI after your heart stent has been placed,   be sure to tell the health care provider who orders the MRI that you have a heart stent.  Keep all follow-up visits as directed by your health care provider. This is important. Contact a health care provider if:  You have a fever.  You have chills.  You have increased bleeding from the catheter insertion area. Hold pressure on the area. Get help right away if:  You develop chest pain or shortness of breath.  You feel faint or you pass out.  You have unusual pain at the catheter insertion area.  You have redness,  warmth, or swelling at the catheter insertion area.  You have drainage (other than a small amount of blood on the dressing) from the catheter insertion area.  The catheter insertion area is bleeding, and the bleeding does not stop after 30 minutes of holding steady pressure on the area.  You develop bleeding from any other place, such as from your rectum. There may be bright red blood in your urine or stool, or it may appear as black, tarry stool. This information is not intended to replace advice given to you by your health care provider. Make sure you discuss any questions you have with your health care provider. Document Released: 07/04/2005 Document Revised: 09/11/2016 Document Reviewed: 09/11/2016 Elsevier Interactive Patient Education  2019 [place].  Heart Attack A heart attack (myocardial infarction, MI) is when blood and oxygen supply to the heart is cut off. A heart attack causes damage to the heart that cannot be fixed. If you think you are having a heart attack, do not wait to see if the symptoms will go away. Get medical help right away. What are the signs or symptoms? Symptoms of this condition include:  Chest pain. It may feel like: ? Crushing or squeezing. ? Tightness, pressure, fullness, or heaviness.  Pain in the arm, neck, jaw, back, or upper body.  Shortness of breath.  Heartburn.  Indigestion.  Nausea.  Cold sweats.  Feeling tired.  Sudden lightheadedness. Follow these instructions at home: Medicines  Take over-the-counter and prescription medicines only as told by your doctor. You may need to take medicine: ? To keep your blood from clotting too easily. ? To control blood pressure. ? To lower cholesterol. ? To control heart rhythms.  Do not take these medicines unless your doctor says it is okay: ? NSAIDs. ? Supplements that have vitamin A, vitamin E, or both. ? Hormone replacement therapy that has estrogen with or without  progestin. Lifestyle   Do not use any products that have nicotine or tobacco. This includes cigarettes and e-cigarettes. If you need help quitting, ask your doctor.  Avoid secondhand smoke.  Exercise regularly. Ask your doctor about a cardiac rehab program.  Eat heart-healthy foods. A diet and nutrition specialist (registered dietitian) can help you form healthy eating habits.  Stay at a healthy weight.  Lower your stress level.  Do not use illegal drugs. Alcohol use  Do not drink alcohol if: ? Your doctor tells you not to drink. ? You are pregnant, may be pregnant, or are planning to become pregnant.  If you drink alcohol, limit how much you have: ? 0-1 drink a day for women. ? 0-2 drinks a day for men.  Know how much alcohol is in your drink. In the [place] one drink equals one typical bottle of beer (12 oz), one-half glass of wine (5 oz), or one shot of hard liquor (1 oz). General instructions  Work with your doctor  to treat other problems you have, like diabetes.  Get screened for depression, and get treatment if needed.  Keep your vaccinations up to date. Get the flu shot (influenza vaccination) every year.  Keep all follow-up visits as told by your doctor. This is important. Contact a doctor if:  You feel sad.  You have trouble doing your daily activities. Get help right away if you:  Have sudden, unexplained discomfort in your: ? Chest. ? Arms. ? Back. ? Neck. ? Jaw. ? Upper body.  Have shortness of breath.  Have sudden sweating or clammy skin.  Feel sick to your stomach (nauseous).  Throw up (vomit).  Suddenly get lightheaded or dizzy.  Feel your heart beating fast.  Feel your heart skipping beats.  Have blood pressure that is higher than 180/120. These symptoms may be an emergency. Do not wait to see if the symptoms will go away. Get medical help right away. Call your local emergency services (911 in the [place] Do not drive yourself to the  hospital. Summary  A heart attack is when blood and oxygen supply to the heart is cut off.  You should not take NSAIDs unless your doctor says it is okay.  Do not smoke. Avoid secondhand smoke.  Exercise regularly. Ask your doctor about a cardiac rehab program. This information is not intended to replace advice given to you by your health care provider. Make sure you discuss any questions you have with your health care provider. Document Released: 06/15/2012 Document Revised: 09/08/2018 Document Reviewed: 01/29/2018 Elsevier Interactive Patient Education  2019 [employer].

## 2019-01-01 NOTE — Progress Notes (Signed)
CARDIAC REHAB PHASE I   PRE:  Rate/Rhythm: 82 SR  BP:  Supine:   Sitting: 148/89  Standing:    SaO2:   MODE:  Ambulation: 370 ft   POST:  Rate/Rhythm: 96 SR  BP:  Supine:   Sitting: 143/95  Standing:    SaO2:  1011-1030 Pt walked 370 ft on RA with steady gait and tolerated well. No CP. No questions re ed done yesterday. Reinforced the importance of brilinta with stent. Has brilinta card. Answered questions re ex ed.    Graylon Good, RN BSN  01/01/2019 10:23 AM

## 2019-01-03 ENCOUNTER — Telehealth: Payer: Self-pay | Admitting: *Deleted

## 2019-01-03 ENCOUNTER — Telehealth: Payer: Self-pay | Admitting: Cardiology

## 2019-01-03 DIAGNOSIS — G479 Sleep disorder, unspecified: Secondary | ICD-10-CM

## 2019-01-03 DIAGNOSIS — G4719 Other hypersomnia: Secondary | ICD-10-CM

## 2019-01-03 NOTE — Telephone Encounter (Signed)
New Message         Patient wife is calling today patient had a heart attack and she states he was told that he needs  his c-pap machine today.

## 2019-01-03 NOTE — Telephone Encounter (Signed)
Pt presented himself to ED on 12/30/18 with prolonged chest pain. EKG showed c/w inferior STEMI. Patient was taken to the cath lab by Dr Tamala Julian. At cath the patient had a 95% PDA that was felt to be the culprit vessel. This was treated with PCI-DES. Pt D/C 01/01/19, and will follow-up w/cardiology Dr Radford Pax.Marland KitchenJohny Chess

## 2019-01-03 NOTE — Telephone Encounter (Signed)
-----   Message from Sueanne Margarita, MD sent at 12/19/2018  9:04 PM EST ----- Please let patient know that they had an unsuccessful CPAP titration due to severity of OSA and set up for in lab BIPAP titration.

## 2019-01-03 NOTE — Telephone Encounter (Signed)
Informed patient of titration results and verbalized understanding was indicated. Patient understands his titration study showed they had an unsuccessful CPAP titration due to severity of OSA and set up for in lab BIPAP titration.  Pt is aware and agreeable to these results. Bipap titration sent to precert

## 2019-01-03 NOTE — Telephone Encounter (Signed)
    Informed patient of titration results and verbalized understanding was indicated. Patient understands his titration study showed they had an unsuccessful CPAP titration due to severity of OSA and set up for in lab BIPAP titration.  Pt is aware and agreeable to these results. Bipap titration sent to precert

## 2019-01-04 ENCOUNTER — Encounter: Payer: Self-pay | Admitting: Internal Medicine

## 2019-01-04 ENCOUNTER — Telehealth: Payer: Self-pay | Admitting: Physician Assistant

## 2019-01-04 ENCOUNTER — Telehealth: Payer: Self-pay | Admitting: *Deleted

## 2019-01-04 NOTE — Telephone Encounter (Signed)
-----   Message from Freada Bergeron, [place] sent at 01/03/2019  3:07 PM EST ----- Regarding: precet BiPAP TITRATION

## 2019-01-04 NOTE — Telephone Encounter (Signed)
Patient contacted regarding discharge from [facility] on 01/01/2019.  Patient understands to follow up with provider Melina Copa, PA-c on 01/18/2019 at 9:00 at [facility] in [place]. Patient understands discharge instructions? Yes Patient understands medications and regiment? Yes Patient understands to bring all medications to this visit? Yes

## 2019-01-04 NOTE — Telephone Encounter (Signed)
New message    Pt has toc scheduled with dayna dunn 01/18/19

## 2019-01-04 NOTE — Telephone Encounter (Signed)
Staff message sent to [name] ok to schedule BIPAP. Patient has Medicare and BCBS supplement. No PA required.

## 2019-01-10 ENCOUNTER — Telehealth: Payer: Self-pay | Admitting: *Deleted

## 2019-01-10 ENCOUNTER — Encounter: Payer: Self-pay | Admitting: *Deleted

## 2019-01-10 NOTE — Telephone Encounter (Signed)
-----   Message from Lauralee Evener, [place] sent at 01/04/2019  9:26 AM EST ----- Regarding: RE: precet Patient has Medicare no PA required. Ok to schedule. ----- Message ----- From: Freada Bergeron, CMA Sent: 01/03/2019   3:07 PM EST To: Cv [facility] Sleep Studies Subject: precet                                         BiPAP TITRATION

## 2019-01-10 NOTE — Telephone Encounter (Signed)
Spoke with the patient, [facility] appointment scheduled on 1/14 with [name], PA-C, 10 days after hospitalization discharge. The patient was referred to cardiac rehab will see the dietician at that time.

## 2019-01-10 NOTE — Telephone Encounter (Signed)
Patient is scheduled for BiPAP Titration on 01/27/19. Patient understands his titration study will be done at [facility] sleep lab. Patient understands he will receive a letter in a week or so detailing appointment, date, time, and location. Patient understands to call if he does not receive the letter  in a timely manner. Patient agrees with treatment and thanked me for call.

## 2019-01-10 NOTE — Telephone Encounter (Signed)
HbA1C was 5.4. Please get a TOC appt for this patient in next 7-10 days and refer to dietician consult  Traci

## 2019-01-11 ENCOUNTER — Encounter: Payer: Self-pay | Admitting: Cardiology

## 2019-01-11 ENCOUNTER — Telehealth (HOSPITAL_COMMUNITY): Payer: Self-pay

## 2019-01-11 ENCOUNTER — Ambulatory Visit (INDEPENDENT_AMBULATORY_CARE_PROVIDER_SITE_OTHER): Payer: Medicare Other | Admitting: Cardiology

## 2019-01-11 VITALS — BP 124/78 | HR 79 | Ht 72.0 in | Wt 282.8 lb

## 2019-01-11 DIAGNOSIS — Z9861 Coronary angioplasty status: Secondary | ICD-10-CM | POA: Diagnosis not present

## 2019-01-11 DIAGNOSIS — E785 Hyperlipidemia, unspecified: Secondary | ICD-10-CM | POA: Diagnosis not present

## 2019-01-11 DIAGNOSIS — I2510 Atherosclerotic heart disease of native coronary artery without angina pectoris: Secondary | ICD-10-CM | POA: Diagnosis not present

## 2019-01-11 MED ORDER — NITROGLYCERIN 0.4 MG SL SUBL: 0.4000 mg | SUBLINGUAL_TABLET | SUBLINGUAL | 5 refills | Status: DC | PRN

## 2019-01-11 NOTE — Progress Notes (Signed)
01/11/2019 [name]   June 23, 1951  [id number]  Primary Physician Binnie Rail, MD Primary Cardiologist: Dr. Radford Pax  Electrophysiologist: None   Reason for Visit/CC: [facility] f/u for CAD s/p STEMI   HPI:  [name] presents to clinic today for post hospital f/u after recent admission for STEMI. In summary, he is a 68 y/o male seen by Dr Radford Pax 10/01/18 for sleep apnea. He apparently underwent a urologic procedure (no records in Epic) and was noted to have significant apnea during the procedure with O2 desats into the 40's. Sleep study done in Nov 2019 confirmed sleep apnea. The patient had not started C-pap yet. The patient also had a GXT done Oct 2019 which was negative for ischemia.   He presented to [facility] 12/30/18 after developing SSCP at home. He was at home working in his yard for roughly 1 hr and started to develop non radiating substernal CP. Did not resolve w/ rest nor ASA. He had his wife drive him to the ED. EKG was c/w inferior STEMI. He was taken to the cath lab by Dr Tamala Julian. At cath the patient had a 95% PDA that was felt to be the culprit vessel. This was treated with PCI-DES. Also noted was CTO CFX with collaterals from the PDA on OM1. There was an unsuccessful attempt at opening the CFX. Also has 70% mid LAD lesion, to be treated medically. EF was 60% with inferobasilar HK.  Troponin peaked at 3.57. Echo showed EF 65% with no WMA, grade 1 DD.  Lipid panel showed LDL at 111 mg/dL. The patient was seen on rounds 01/01/19 and felt to be stable for discharge.   He presents back to clinic today for post hospital f/u. He is here today with his wife. He reports that he has done well w/o any recurrent CP. He denies dyspnea. He has been fully compliant w/ all meds. No side effects. Denies abnormal bleeding with DAPT. Radial cath site is stable. He has been walking at home, up to 30 min at a time. He plans to start cardiac rehab.   Cardiac Studies  Procedures   Coronary/Graft Acute MI  Revascularization  LEFT HEART CATH AND CORONARY ANGIOGRAPHY 12/30/18  Conclusion     A stent was successfully placed.    Acute inferior ST elevation MI  Reduction of 95% PDA (TIMI III) to less than 20% with a 20 x 3.5 mm Synergy postdilated to 4.0 mm in diameter with resultant TIMI grade III flow.  Likely chronic occlusion of the distal circumflex receiving collaterals from the PDA and also from the first obtuse marginal.  Failed PCI on distal circumflex.  70% mid LAD best treated with medical therapy at this time.  Normal left main  Overall normal LV function with EF 60%.  Inferobasal hypokinesis is noted.  Mild elevation in LV EDP.  RECOMMENDATIONS:   Aggressive secondary risk modification: LDL less than 70, check for evidence of glucose intolerance, manage sleep apnea which is been diagnosed but is not currently being treated, blood pressure 130/80 mmHg or less.  Dual antiplatelet therapy with aspirin and Brilinta x12 months.  Phase 2 cardiac rehab.  Cycle cardiac markers.  2D Doppler echocardiogram.  May be able to discharge within 24 to 36 hours.   Coronary Diagrams   Diagnostic  Dominance: Right    Intervention       2D Echo 12/31/18 Study Conclusions  - Left ventricle: The cavity size was normal. Wall thickness was   increased  in a pattern of mild LVH. Systolic function was   vigorous. The estimated ejection fraction was in the range of 65%   to 70%. Wall motion was normal; there were no regional wall   motion abnormalities. Doppler parameters are consistent with   abnormal left ventricular relaxation (grade 1 diastolic   dysfunction). - Ventricular septum: The contour showed systolic flattening. - Mitral valve: There was trivial regurgitation. - Left atrium: The atrium was mildly dilated.   Current Meds  Medication Sig  . acetaminophen (TYLENOL) 325 MG tablet Take 2 tablets (650 mg total) by mouth every 4 (four) hours as needed for headache or  mild pain.  Marland Kitchen aspirin 81 MG chewable tablet Chew 1 tablet (81 mg total) by mouth daily.  Marland Kitchen atorvastatin (LIPITOR) 80 MG tablet Take 1 tablet (80 mg total) by mouth daily at 6 PM.  . baclofen (LIORESAL) 10 MG tablet Take 1 tablet (10 mg total) by mouth every 8 (eight) hours as needed for muscle spasms.  Marland Kitchen levothyroxine (SYNTHROID, LEVOTHROID) 150 MCG tablet TAKE 1 TABLET BY MOUTH EVERY DAY  . losartan (COZAAR) 50 MG tablet Take 1 tablet (50 mg total) by mouth daily.  . metoprolol succinate (TOPROL-XL) 25 MG 24 hr tablet Take 1 tablet (25 mg total) by mouth daily.  . Multiple Vitamins-Minerals (MULTIVITAMIN) tablet Take 1 tablet by mouth daily.  . nitroGLYCERIN (NITROSTAT) 0.4 MG SL tablet Place 1 tablet (0.4 mg total) under the tongue every 5 (five) minutes as needed for chest pain.  . ticagrelor (BRILINTA) 90 MG TABS tablet Take 1 tablet (90 mg total) by mouth 2 (two) times daily.   No Known Allergies Past Medical History:  Diagnosis Date  . Arthritis    Knees  . BPH (benign prostatic hypertrophy)   . COLONIC POLYPS, HX OF 2007   clear colo w/o polyps 04/2015: 24yr follow up  . HYPERTENSION   . HYPOTHYROIDISM   . OBESITY   . PAF (paroxysmal atrial fibrillation) ([place]) 10/01/2018   Family History  Problem Relation Age of Onset  . Heart disease Father 41       CABG age 35  . Hypertension Father   . Diabetes Father   . Dementia Mother        ?ALS vs SNP  . ALS Maternal Grandfather   . Arthritis Other        Parent & grandparents  . Colon cancer Neg Hx    Past Surgical History:  Procedure Laterality Date  . arthroscopic knee surgery     (R) 2003 & (L) 2010  . COLONOSCOPY  04/2015   no polyps ([place])  . CORONARY/GRAFT ACUTE MI REVASCULARIZATION N/A 12/30/2018   Procedure: Coronary/Graft Acute MI Revascularization;  Surgeon: Belva Crome, MD;  Location: [facility];  Service: Cardiovascular;  Laterality: N/A;  . LAMINECTOMY  1991  . LEFT HEART CATH AND CORONARY ANGIOGRAPHY  N/A 12/30/2018   Procedure: LEFT HEART CATH AND CORONARY ANGIOGRAPHY;  Surgeon: Belva Crome, MD;  Location: [facility];  Service: Cardiovascular;  Laterality: N/A;  . TONSILLECTOMY  1990   w/ adenoids and uvula  . TONSILLECTOMY    . TOTAL HIP ARTHROPLASTY Right 01/2011   [place]  . TOTAL HIP ARTHROPLASTY Left 04/16/2016   Procedure: TOTAL HIP ARTHROPLASTY ANTERIOR APPROACH;  Surgeon: Gaynelle Arabian, MD;  Location: [facility];  Service: Orthopedics;  Laterality: Left;  . TOTAL KNEE ARTHROPLASTY  12/27/2012   Procedure: TOTAL KNEE BILATERAL;  Surgeon: Dione Plover  Aluisio, MD;  Location: [facility];  Service: Orthopedics;  Laterality: Bilateral;   Social History   Socioeconomic History  . Marital status: Married    Spouse name: Not on file  . Number of children: Not on file  . Years of education: Not on file  . Highest education level: Not on file  Occupational History  . Not on file  Social Needs  . Financial resource strain: Not on file  . Food insecurity:    Worry: Not on file    Inability: Not on file  . Transportation needs:    Medical: Not on file    Non-medical: Not on file  Tobacco Use  . Smoking status: Never Smoker  . Smokeless tobacco: Never Used  Substance and Sexual Activity  . Alcohol use: Yes    Alcohol/week: 0.0 standard drinks    Comment: rarely, social  . Drug use: No  . Sexual activity: Not on file  Lifestyle  . Physical activity:    Days per week: Not on file    Minutes per session: Not on file  . Stress: Not on file  Relationships  . Social connections:    Talks on phone: Not on file    Gets together: Not on file    Attends religious service: Not on file    Active member of club or organization: Not on file    Attends meetings of clubs or organizations: Not on file    Relationship status: Not on file  . Intimate partner violence:    Fear of current or ex partner: Not on file    Emotionally abused: Not on file    Physically abused: Not on file     Forced sexual activity: Not on file  Other Topics Concern  . Not on file  Social History Narrative   Working part time, contemplating retirement end of 2016   Lives at home with spouse      Exercise: water exercises     Review of Systems: General: negative for chills, fever, night sweats or weight changes.  Cardiovascular: negative for chest pain, dyspnea on exertion, edema, orthopnea, palpitations, paroxysmal nocturnal dyspnea or shortness of breath Dermatological: negative for rash Respiratory: negative for cough or wheezing Urologic: negative for hematuria Abdominal: negative for nausea, vomiting, diarrhea, bright red blood per rectum, melena, or hematemesis Neurologic: negative for visual changes, syncope, or dizziness All other systems reviewed and are otherwise negative except as noted above.   Physical Exam:  Blood pressure 124/78, pulse 79, height 6' (1.829 m), weight 282 lb 12.8 oz (128.3 kg), SpO2 93 %.  General appearance: middle aged, obese WM, alert, cooperative and no distress Neck: no carotid bruit and no JVD Lungs: clear to auscultation bilaterally Heart: regular rate and rhythm, S1, S2 normal, no murmur, click, rub or gallop Extremities: extremities normal, atraumatic, no cyanosis or edema Pulses: 2+ and symmetric Skin: Skin color, texture, turgor normal. No rashes or lesions Neurologic: Grossly normal  EKG not performed -- personally reviewed   ASSESSMENT AND PLAN:   1. CAD: s/p recent STEMI 12/30/18 s/p status post PCI of PDA. Also residual disease - CTO CFX with collaterals from the PDA on OM1. There was an unsuccessful attempt at opening the CFX. Also 70% mid LAD. Plan to treat medically. He is doing well. No recurrent anginal symptoms. Continue on DAPT w/ ASA + Brilinta for a minimum of 12 months, beta blocker, high dose statin and ARB. Plan to start cardiac rehab.   2.  HLD: lipid panel during hospitalization for STEMI showed elevated LDL at 111 mg/dL. Goal  LDL in the setting of CAD+ is < 70 mg/dL. He is now on high intensity statin, Lipitor 80, and tolerating well. Plan to repeat FLP and HFTs in 6 more weeks. If not at goal, add zetia +/- PCSK9 inhibitor therapy.   3. OSA: recent diagnosis. Needs split night sleep study. He is scheduled for BiPAP Titration on 5/91/02.  4. Diastolic Dysfunction: I9KS noted on recent echo. EF normal. HR and BP controlled on  blocker. He denies dyspnea. No edema.    Follow-Up w/ Dr. Radford Pax in 3 months.   [name], MHS [facility] 01/11/2019 2:06 PM

## 2019-01-11 NOTE — Telephone Encounter (Signed)
Called patient to see if he is interested in the Cardiac Rehab Program. Patient expressed interest. Explained scheduling process and went over insurance, patient verbalized understanding. Will contact patient for scheduling once f/u has been completed.  °

## 2019-01-11 NOTE — Telephone Encounter (Signed)
Pt insurance is active and benefits verified through Medicare A/B. Co-pay $0.00, DED $198.00/$0.00 met, out of pocket $0.00/$0.00 met, co-insurance 20%. No pre-authorization required. Passport, 01/11/2019 @ 10:17AM, REF# [id number]  2ndary insurance is active and benefits verified through [facility]. Co-pay $0.00, DED $0.00/$0.00 met, out of pocket $0.00/$0.00 met, co-insurance 0%. No pre-authorization required. Passport, 01/11/2019 @ 10:21AM, REF# [id number]  Will contact patient to see if he is interested in the Cardiac Rehab Program. If interested, patient will need to complete follow up appt. Once completed, patient will be contacted for scheduling upon review by the RN Navigator.

## 2019-01-11 NOTE — Patient Instructions (Addendum)
Medication Instructions:  NONE If you need a refill on your cardiac medications before your next appointment, please call your pharmacy.   Lab work:PLEASE SCHEDULE TO BE DONE IN 6 WEEKS  FLP HFT If you have labs (blood work) drawn today and your tests are completely normal, you will receive your results only by: Marland Kitchen MyChart Message (if you have MyChart) OR . A paper copy in the mail If you have any lab test that is abnormal or we need to change your treatment, we will call you to review the results.  Testing/Procedures: NONE  Follow-Up:Dr Turner 3 MONTHS (SLEEP CLINIC)   At [facility], you and your health needs are our priority.  As part of our continuing mission to provide you with exceptional heart care, we have created designated Provider Care Teams.  These Care Teams include your primary Cardiologist (physician) and Advanced Practice Providers (APPs -  Physician Assistants and Nurse Practitioners) who all work together to provide you with the care you need, when you need it. .   Any Other Special Instructions Will Be Listed Below (If Applicable).

## 2019-01-13 NOTE — Telephone Encounter (Signed)
Bipap scheduled for 01/27/19. Pt is aware and agreeable to treatment.

## 2019-01-17 ENCOUNTER — Telehealth (HOSPITAL_COMMUNITY): Payer: Self-pay

## 2019-01-17 NOTE — Telephone Encounter (Signed)
Received phone call from pt wife Hassan Rowan who stated pt is ready for CR. Patient will come in for orientation on 02/08/2019 @ 730AM and will attend the 815AM exercise class. Went over [employer], she verbalized understanding.   Mailed homework package.

## 2019-01-18 ENCOUNTER — Ambulatory Visit: Payer: Medicare Other | Admitting: Physician Assistant

## 2019-01-19 ENCOUNTER — Telehealth (HOSPITAL_COMMUNITY): Payer: Self-pay | Admitting: Pharmacist

## 2019-01-19 NOTE — Progress Notes (Addendum)
Cardiac Rehab Medication   Does the patient  feel that his/her medications are working for him/her?  yes  Has the patient been experiencing any side effects to the medications prescribed?  no  Does the patient measure his/her own blood pressure or blood glucose at home?  yes   Does the patient have any problems obtaining medications due to transportation or finances?   no  Understanding of regimen: excellent Understanding of indications: excellent Potential of compliance: excellent    Comments: Pt has good understanding of his medications.  Pt wife is a retired [employer] scientist who has reviewed his medications and indications for taking them with him.  Pt has blood pressure machine and checks it 3-4 times a week.    [place] RN 01/19/2019 3:51 PM

## 2019-01-20 ENCOUNTER — Encounter (HOSPITAL_COMMUNITY)
Admission: RE | Admit: 2019-01-20 | Discharge: 2019-01-20 | Disposition: A | Discharge status: Home / Self Care | Payer: Medicare Other | Source: Ambulatory Visit | Attending: Cardiology | Admitting: Cardiology

## 2019-01-20 ENCOUNTER — Encounter (HOSPITAL_COMMUNITY): Payer: Self-pay

## 2019-01-20 ENCOUNTER — Telehealth (HOSPITAL_COMMUNITY): Payer: Self-pay

## 2019-01-20 VITALS — BP 122/70 | HR 69 | Ht 72.0 in | Wt 278.4 lb

## 2019-01-20 DIAGNOSIS — Z955 Presence of coronary angioplasty implant and graft: Secondary | ICD-10-CM | POA: Insufficient documentation

## 2019-01-20 DIAGNOSIS — I2119 ST elevation (STEMI) myocardial infarction involving other coronary artery of inferior wall: Secondary | ICD-10-CM | POA: Diagnosis not present

## 2019-01-20 HISTORY — DX: Atherosclerotic heart disease of native coronary artery without angina pectoris: I25.10

## 2019-01-20 NOTE — Telephone Encounter (Signed)
Pt wife Hassan Rowan called and request pt class time be changed to 945.

## 2019-01-20 NOTE — Progress Notes (Signed)
Cardiac Individual Treatment Plan  Patient Details  Name: [name] MRN: [id number] Date of Birth: [birth year] Referring Provider:     [place] from 01/20/2019 in [place]  Referring Provider  Dr. Radford Pax      Initial Encounter Date:    CARDIAC REHAB PHASE II ORIENTATION from 01/20/2019 in [place]  Date  01/20/19      Visit Diagnosis: Acute ST elevation myocardial infarction (STEMI) of inferior wall ([place]), 12/30/18  Status post coronary artery stent placement,DES RPDA 12/30/18  Patient's Home Medications on Admission:  Current Outpatient Medications:  .  acetaminophen (TYLENOL) 325 MG tablet, Take 2 tablets (650 mg total) by mouth every 4 (four) hours as needed for headache or mild pain., Disp: , Rfl:  .  aspirin 81 MG chewable tablet, Chew 1 tablet (81 mg total) by mouth daily., Disp: , Rfl:  .  atorvastatin (LIPITOR) 80 MG tablet, Take 1 tablet (80 mg total) by mouth daily at 6 PM., Disp: 90 tablet, Rfl: 3 .  levothyroxine (SYNTHROID, LEVOTHROID) 150 MCG tablet, TAKE 1 TABLET BY MOUTH EVERY DAY, Disp: 90 tablet, Rfl: 0 .  losartan (COZAAR) 50 MG tablet, Take 1 tablet (50 mg total) by mouth daily., Disp: 90 tablet, Rfl: 3 .  metoprolol succinate (TOPROL-XL) 25 MG 24 hr tablet, Take 1 tablet (25 mg total) by mouth daily., Disp: 90 tablet, Rfl: 3 .  Multiple Vitamins-Minerals (MULTIVITAMIN) tablet, Take 1 tablet by mouth daily., Disp: , Rfl:  .  nitroGLYCERIN (NITROSTAT) 0.4 MG SL tablet, Place 1 tablet (0.4 mg total) under the tongue every 5 (five) minutes as needed for chest pain., Disp: 25 tablet, Rfl: 5 .  ticagrelor (BRILINTA) 90 MG TABS tablet, Take 1 tablet (90 mg total) by mouth 2 (two) times daily., Disp: 180 tablet, Rfl: 3 .  baclofen (LIORESAL) 10 MG tablet, Take 1 tablet (10 mg total) by mouth every 8 (eight) hours as needed for muscle spasms. (Patient not taking: Reported on  01/19/2019), Disp: 30 each, Rfl: 0  Past Medical History: Past Medical History:  Diagnosis Date  . Arthritis    Knees  . BPH (benign prostatic hypertrophy)   . COLONIC POLYPS, HX OF 2007   clear colo w/o polyps 04/2015: 69yr follow up  . Coronary artery disease   . HYPERTENSION   . HYPOTHYROIDISM   . OBESITY   . PAF (paroxysmal atrial fibrillation) ([facility]) 10/01/2018    Tobacco Use: Social History   Tobacco Use  Smoking Status Never Smoker  Smokeless Tobacco Never Used    Labs: Recent Review Flowsheet Data    Labs for ITP Cardiac and Pulmonary Rehab Latest Ref Rng & Units 08/19/2018 12/30/2018 12/30/2018 12/30/2018 12/31/2018   Cholestrol 0 - 200 mg/dL 179 198 - 167 -   LDLCALC 0 - 99 mg/dL 112(H) 93 - 111(H) -   LDLDIRECT mg/dL - - - - -   HDL >40 mg/dL 48.10 41 - 39(L) -   Trlycerides <150 mg/dL 93.0 322(H) - 83 -   Hemoglobin A1c 4.8 - 5.6 % 5.9 - - - 5.4   TCO2 22 - 32 mmol/L - - 24 - -      Capillary Blood Glucose: No results found for: GLUCAP   Exercise Target Goals: Exercise Program Goal: Individual exercise prescription set using results from initial 6 min walk test and THRR while considering  patient's activity barriers and safety.   Exercise Prescription Goal:  Initial exercise prescription builds to 30-45 minutes a day of aerobic activity, 2-3 days per week.  Home exercise guidelines will be given to patient during program as part of exercise prescription that the participant will acknowledge.  Activity Barriers & Risk Stratification: Activity Barriers & Cardiac Risk Stratification - 01/20/19 0942      Activity Barriers & Cardiac Risk Stratification   Activity Barriers  Left Knee Replacement;Right Hip Replacement;Right Knee Replacement;Left Hip Replacement;Back Problems    Cardiac Risk Stratification  High       6 Minute Walk: 6 Minute Walk    Row Name 01/20/19 0941         6 Minute Walk   Phase  Initial     Distance  1453 feet     Walk Time  6 minutes      # of Rest Breaks  0     MPH  2.7     METS  2.9     RPE  11     VO2 Peak  10.41     Symptoms  No     Resting HR  69 bpm     Resting BP  122/70     Resting Oxygen Saturation   97 %     Exercise Oxygen Saturation  during 6 min walk  97 %     Max Ex. HR  107 bpm     Max Ex. BP  140/78     2 Minute Post BP  114/82        Oxygen Initial Assessment:   Oxygen Re-Evaluation:   Oxygen Discharge (Final Oxygen Re-Evaluation):   Initial Exercise Prescription: Initial Exercise Prescription - 01/20/19 0900      Date of Initial Exercise RX and Referring Provider   Date  01/20/19    Referring Provider  Dr. Radford Pax    Expected Discharge Date  04/25/19      Treadmill   MPH  2.5    Grade  1    Minutes  10      Bike   Level  1    Minutes  10    METs  2.51      NuStep   Level  3    SPM  85    Minutes  10    METs  2.5      Prescription Details   Frequency (times per week)  3    Duration  Progress to 30 minutes of continuous aerobic without signs/symptoms of physical distress      Intensity   THRR 40-80% of Max Heartrate  62-124    Ratings of Perceived Exertion  11-13      Progression   Progression  Continue to progress workloads to maintain intensity without signs/symptoms of physical distress.      Resistance Training   Training Prescription  Yes    Weight  4 lbs.    Reps  10-15       Perform Capillary Blood Glucose checks as needed.  Exercise Prescription Changes:   Exercise Comments:   Exercise Goals and Review: Exercise Goals    Row Name 01/20/19 [phone]             Exercise Goals   Increase Physical Activity  Yes       Intervention  Provide advice, education, support and counseling about physical activity/exercise needs.;Develop an individualized exercise prescription for aerobic and resistive training based on initial evaluation findings, risk stratification, comorbidities and participant's personal goals.  Expected Outcomes  Short Term: Attend  rehab on a regular basis to increase amount of physical activity.       Increase Strength and Stamina  Yes       Intervention  Provide advice, education, support and counseling about physical activity/exercise needs.;Develop an individualized exercise prescription for aerobic and resistive training based on initial evaluation findings, risk stratification, comorbidities and participant's personal goals.       Expected Outcomes  Short Term: Increase workloads from initial exercise prescription for resistance, speed, and METs.       Able to understand and use rate of perceived exertion (RPE) scale  Yes       Intervention  Provide education and explanation on how to use RPE scale       Expected Outcomes  Short Term: Able to use RPE daily in rehab to express subjective intensity level;Long Term:  Able to use RPE to guide intensity level when exercising independently       Knowledge and understanding of Target Heart Rate Range (THRR)  Yes       Intervention  Provide education and explanation of THRR including how the numbers were predicted and where they are located for reference       Expected Outcomes  Short Term: Able to state/look up THRR;Long Term: Able to use THRR to govern intensity when exercising independently;Short Term: Able to use daily as guideline for intensity in rehab       Able to check pulse independently  Yes       Intervention  Provide education and demonstration on how to check pulse in carotid and radial arteries.;Review the importance of being able to check your own pulse for safety during independent exercise       Expected Outcomes  Short Term: Able to explain why pulse checking is important during independent exercise;Long Term: Able to check pulse independently and accurately       Understanding of Exercise Prescription  Yes       Intervention  Provide education, explanation, and written materials on patient's individual exercise prescription       Expected Outcomes  Short Term:  Able to explain program exercise prescription;Long Term: Able to explain home exercise prescription to exercise independently          Exercise Goals Re-Evaluation :   Discharge Exercise Prescription (Final Exercise Prescription Changes):   Nutrition:  Target Goals: Understanding of nutrition guidelines, daily intake of sodium 1500mg , cholesterol 200mg , calories 30% from fat and 7% or less from saturated fats, daily to have 5 or more servings of fruits and vegetables.  Biometrics: Pre Biometrics - 01/20/19 0925      Pre Biometrics   Waist Circumference  51.5 inches    Hip Circumference  47 inches    Waist to Hip Ratio  1.1 %    Triceps Skinfold  31 mm    % Body Fat  39.1 %    Grip Strength  33 kg    Flexibility  0 in    Single Leg Stand  3.75 seconds        Nutrition Therapy Plan and Nutrition Goals:   Nutrition Assessments:   Nutrition Goals Re-Evaluation:   Nutrition Goals Re-Evaluation:   Nutrition Goals Discharge (Final Nutrition Goals Re-Evaluation):   Psychosocial: Target Goals: Acknowledge presence or absence of significant depression and/or stress, maximize coping skills, provide positive support system. Participant is able to verbalize types and ability to use techniques and skills needed for reducing stress  and depression.  Initial Review & Psychosocial Screening: Initial Psych Review & Screening - 01/20/19 1039      Initial Review   Current issues with  None Identified      Family Dynamics   Good Support System?  Yes   [name] has his wife for support     Barriers   Psychosocial barriers to participate in program  There are no identifiable barriers or psychosocial needs.      Screening Interventions   Interventions  Encouraged to exercise       Quality of Life Scores: Quality of Life - 01/20/19 0944      Quality of Life   Select  Quality of Life      Quality of Life Scores   Health/Function Pre  24.03 %    Socioeconomic Pre  27.5 %     Psych/Spiritual Pre  22.29 %    Family Pre  28.8 %    GLOBAL Pre  25.09 %      Scores of 19 and below usually indicate a poorer quality of life in these areas.  A difference of  2-3 points is a clinically meaningful difference.  A difference of 2-3 points in the total score of the Quality of Life Index has been associated with significant improvement in overall quality of life, self-image, physical symptoms, and general health in studies assessing change in quality of life.  PHQ-9: Recent Review Flowsheet Data    Depression screen [facility] 2/9 08/19/2018 05/12/2018 03/30/2014   Decreased Interest 0 0 0   Down, Depressed, Hopeless 0 0 0   PHQ - 2 Score 0 0 0   Altered sleeping - 2 -   Tired, decreased energy - 1 -   Change in appetite - 0 -   Feeling bad or failure about yourself  - 0 -   Trouble concentrating - 0 -   Moving slowly or fidgety/restless - 0 -   Suicidal thoughts - 0 -   PHQ-9 Score - 3 -   Difficult doing work/chores - Not difficult at all -     Interpretation of Total Score  Total Score Depression Severity:  1-4 = Minimal depression, 5-9 = Mild depression, 10-14 = Moderate depression, 15-19 = Moderately severe depression, 20-27 = Severe depression   Psychosocial Evaluation and Intervention:   Psychosocial Re-Evaluation:   Psychosocial Discharge (Final Psychosocial Re-Evaluation):   Vocational Rehabilitation: Provide vocational rehab assistance to qualifying candidates.   Vocational Rehab Evaluation & Intervention: Vocational Rehab - 01/20/19 1042      Initial Vocational Rehab Evaluation & Intervention   Assessment shows need for Vocational Rehabilitation  No   [name] is retired and does not need vocational rehab at this time      Education: Education Goals: Education classes will be provided on a weekly basis, covering required topics. Participant will state understanding/return demonstration of topics presented.  Learning Barriers/Preferences: Learning  Barriers/Preferences - 01/20/19 0944      Learning Barriers/Preferences   Learning Barriers  Sight    Learning Preferences  None       Education Topics: Count Your Pulse:  -Group instruction provided by verbal instruction, demonstration, patient participation and written materials to support subject.  Instructors address importance of being able to find your pulse and how to count your pulse when at home without a heart monitor.  Patients get hands on experience counting their pulse with staff help and individually.   Heart Attack, Angina, and Risk Factor Modification:  -  Group instruction provided by verbal instruction, video, and written materials to support subject.  Instructors address signs and symptoms of angina and heart attacks.    Also discuss risk factors for heart disease and how to make changes to improve heart health risk factors.   Functional Fitness:  -Group instruction provided by verbal instruction, demonstration, patient participation, and written materials to support subject.  Instructors address safety measures for doing things around the house.  Discuss how to get up and down off the floor, how to pick things up properly, how to safely get out of a chair without assistance, and balance training.   Meditation and Mindfulness:  -Group instruction provided by verbal instruction, patient participation, and written materials to support subject.  Instructor addresses importance of mindfulness and meditation practice to help reduce stress and improve awareness.  Instructor also leads participants through a meditation exercise.    Stretching for Flexibility and Mobility:  -Group instruction provided by verbal instruction, patient participation, and written materials to support subject.  Instructors lead participants through series of stretches that are designed to increase flexibility thus improving mobility.  These stretches are additional exercise for major muscle groups that  are typically performed during regular warm up and cool down.   Hands Only CPR:  -Group verbal, video, and participation provides a basic overview of AHA guidelines for community CPR. Role-play of emergencies allow participants the opportunity to practice calling for help and chest compression technique with discussion of AED use.   Hypertension: -Group verbal and written instruction that provides a basic overview of hypertension including the most recent diagnostic guidelines, risk factor reduction with self-care instructions and medication management.    Nutrition I class: Heart Healthy Eating:  -Group instruction provided by PowerPoint slides, verbal discussion, and written materials to support subject matter. The instructor gives an explanation and review of the Therapeutic Lifestyle Changes diet recommendations, which includes a discussion on lipid goals, dietary fat, sodium, fiber, plant stanol/sterol esters, sugar, and the components of a well-balanced, healthy diet.   Nutrition II class: Lifestyle Skills:  -Group instruction provided by PowerPoint slides, verbal discussion, and written materials to support subject matter. The instructor gives an explanation and review of label reading, grocery shopping for heart health, heart healthy recipe modifications, and ways to make healthier choices when eating out.   Diabetes Question & Answer:  -Group instruction provided by PowerPoint slides, verbal discussion, and written materials to support subject matter. The instructor gives an explanation and review of diabetes co-morbidities, pre- and post-prandial blood glucose goals, pre-exercise blood glucose goals, signs, symptoms, and treatment of hypoglycemia and hyperglycemia, and foot care basics.   Diabetes Blitz:  -Group instruction provided by PowerPoint slides, verbal discussion, and written materials to support subject matter. The instructor gives an explanation and review of the  physiology behind type 1 and type 2 diabetes, diabetes medications and rational behind using different medications, pre- and post-prandial blood glucose recommendations and Hemoglobin A1c goals, diabetes diet, and exercise including blood glucose guidelines for exercising safely.    Portion Distortion:  -Group instruction provided by PowerPoint slides, verbal discussion, written materials, and food models to support subject matter. The instructor gives an explanation of serving size versus portion size, changes in portions sizes over the last 20 years, and what consists of a serving from each food group.   Stress Management:  -Group instruction provided by verbal instruction, video, and written materials to support subject matter.  Instructors review role of stress in heart disease  and how to cope with stress positively.     Exercising on Your Own:  -Group instruction provided by verbal instruction, power point, and written materials to support subject.  Instructors discuss benefits of exercise, components of exercise, frequency and intensity of exercise, and end points for exercise.  Also discuss use of nitroglycerin and activating EMS.  Review options of places to exercise outside of rehab.  Review guidelines for sex with heart disease.   Cardiac Drugs I:  -Group instruction provided by verbal instruction and written materials to support subject.  Instructor reviews cardiac drug classes: antiplatelets, anticoagulants, beta blockers, and statins.  Instructor discusses reasons, side effects, and lifestyle considerations for each drug class.   Cardiac Drugs II:  -Group instruction provided by verbal instruction and written materials to support subject.  Instructor reviews cardiac drug classes: angiotensin converting enzyme inhibitors (ACE-I), angiotensin II receptor blockers (ARBs), nitrates, and calcium channel blockers.  Instructor discusses reasons, side effects, and lifestyle considerations  for each drug class.   Anatomy and Physiology of the Circulatory System:  Group verbal and written instruction and models provide basic cardiac anatomy and physiology, with the coronary electrical and arterial systems. Review of: AMI, Angina, Valve disease, Heart Failure, Peripheral Artery Disease, Cardiac Arrhythmia, Pacemakers, and the ICD.   Other Education:  -Group or individual verbal, written, or video instructions that support the educational goals of the cardiac rehab program.   Holiday Eating Survival Tips:  -Group instruction provided by PowerPoint slides, verbal discussion, and written materials to support subject matter. The instructor gives patients tips, tricks, and techniques to help them not only survive but enjoy the holidays despite the onslaught of food that accompanies the holidays.   Knowledge Questionnaire Score: Knowledge Questionnaire Score - 01/20/19 0945      Knowledge Questionnaire Score   Pre Score  23/24       Core Components/Risk Factors/Patient Goals at Admission: Personal Goals and Risk Factors at Admission - 01/20/19 0945      Core Components/Risk Factors/Patient Goals on Admission    Weight Management  Yes;Obesity;Weight Maintenance;Weight Loss    Intervention  Weight Management: Develop a combined nutrition and exercise program designed to reach desired caloric intake, while maintaining appropriate intake of nutrient and fiber, sodium and fats, and appropriate energy expenditure required for the weight goal.;Weight Management: Provide education and appropriate resources to help participant work on and attain dietary goals.;Weight Management/Obesity: Establish reasonable short term and long term weight goals.;Obesity: Provide education and appropriate resources to help participant work on and attain dietary goals.    Admit Weight  278 lb 7.1 oz (126.3 kg)    Expected Outcomes  Short Term: Continue to assess and modify interventions until short term  weight is achieved;Long Term: Adherence to nutrition and physical activity/exercise program aimed toward attainment of established weight goal;Weight Maintenance: Understanding of the daily nutrition guidelines, which includes 25-35% calories from fat, 7% or less cal from saturated fats, less than 200mg  cholesterol, less than 1.5gm of sodium, & 5 or more servings of fruits and vegetables daily;Understanding recommendations for meals to include 15-35% energy as protein, 25-35% energy from fat, 35-60% energy from carbohydrates, less than 200mg  of dietary cholesterol, 20-35 gm of total fiber daily;Understanding of distribution of calorie intake throughout the day with the consumption of 4-5 meals/snacks;Weight Loss: Understanding of general recommendations for a balanced deficit meal plan, which promotes 1-2 lb weight loss per week and includes a negative energy balance of [phone] kcal/d    Hypertension  Yes    Intervention  Monitor prescription use compliance.;Provide education on lifestyle modifcations including regular physical activity/exercise, weight management, moderate sodium restriction and increased consumption of fresh fruit, vegetables, and low fat dairy, alcohol moderation, and smoking cessation.    Expected Outcomes  Short Term: Continued assessment and intervention until BP is < 140/34mm HG in hypertensive participants. < 130/13mm HG in hypertensive participants with diabetes, heart failure or chronic kidney disease.;Long Term: Maintenance of blood pressure at goal levels.    Lipids  Yes    Intervention  Provide education and support for participant on nutrition & aerobic/resistive exercise along with prescribed medications to achieve LDL 70mg , HDL >40mg .    Expected Outcomes  Short Term: Participant states understanding of desired cholesterol values and is compliant with medications prescribed. Participant is following exercise prescription and nutrition guidelines.;Long Term: Cholesterol  controlled with medications as prescribed, with individualized exercise RX and with personalized nutrition plan. Value goals: LDL < 70mg , HDL > 40 mg.    Stress  Yes    Intervention  Offer individual and/or small group education and counseling on adjustment to heart disease, stress management and health-related lifestyle change. Teach and support self-help strategies.;Refer participants experiencing significant psychosocial distress to appropriate mental health specialists for further evaluation and treatment. When possible, include family members and significant others in education/counseling sessions.    Expected Outcomes  Short Term: Participant demonstrates changes in health-related behavior, relaxation and other stress management skills, ability to obtain effective social support, and compliance with psychotropic medications if prescribed.;Long Term: Emotional wellbeing is indicated by absence of clinically significant psychosocial distress or social isolation.       Core Components/Risk Factors/Patient Goals Review:    Core Components/Risk Factors/Patient Goals at Discharge (Final Review):    ITP Comments: ITP Comments    Row Name 01/20/19 0930           ITP Comments  Dr. Fransico Him, Medical Director         t  Comments:[name] attended orientation from [phone] to [phone] to review rules and guidelines for program. Completed 6 minute walk test, Intitial ITP, and exercise prescription.  VSS. Telemetry-Sinus Rhythm.  Asymptomatic.[name], RN,BSN 01/20/2019 10:51 AM

## 2019-01-20 NOTE — Progress Notes (Addendum)
[name] 68 y.o. male DOB [birth year] MRN [id number]       Nutrition Screener Note  1. Acute ST elevation myocardial infarction (STEMI) of inferior wall ([place]), 12/30/18   2. Status post coronary artery stent placement,DES RPDA 12/30/18    Past Medical History:  Diagnosis Date  . Arthritis    Knees  . BPH (benign prostatic hypertrophy)   . COLONIC POLYPS, HX OF 2007   clear colo w/o polyps 04/2015: 86yr follow up  . Coronary artery disease   . HYPERTENSION   . HYPOTHYROIDISM   . OBESITY   . PAF (paroxysmal atrial fibrillation) ([place]) 10/01/2018   Meds reviewed.    Current Outpatient Medications (Endocrine & Metabolic):  .  levothyroxine (SYNTHROID, LEVOTHROID) 150 MCG tablet, TAKE 1 TABLET BY MOUTH EVERY DAY  Current Outpatient Medications (Cardiovascular):  .  atorvastatin (LIPITOR) 80 MG tablet, Take 1 tablet (80 mg total) by mouth daily at 6 PM. .  losartan (COZAAR) 50 MG tablet, Take 1 tablet (50 mg total) by mouth daily. .  metoprolol succinate (TOPROL-XL) 25 MG 24 hr tablet, Take 1 tablet (25 mg total) by mouth daily. .  nitroGLYCERIN (NITROSTAT) 0.4 MG SL tablet, Place 1 tablet (0.4 mg total) under the tongue every 5 (five) minutes as needed for chest pain.   Current Outpatient Medications (Analgesics):  .  acetaminophen (TYLENOL) 325 MG tablet, Take 2 tablets (650 mg total) by mouth every 4 (four) hours as needed for headache or mild pain. Marland Kitchen  aspirin 81 MG chewable tablet, Chew 1 tablet (81 mg total) by mouth daily.  Current Outpatient Medications (Hematological):  .  ticagrelor (BRILINTA) 90 MG TABS tablet, Take 1 tablet (90 mg total) by mouth 2 (two) times daily.  Current Outpatient Medications (Other):  Marland Kitchen  Multiple Vitamins-Minerals (MULTIVITAMIN) tablet, Take 1 tablet by mouth daily. .  baclofen (LIORESAL) 10 MG tablet, Take 1 tablet (10 mg total) by mouth every 8 (eight) hours as needed for muscle spasms. (Patient not taking: Reported on 01/19/2019)   HT: Ht  Readings from Last 1 Encounters:  01/20/19 6' (1.829 m)    WT: Wt Readings from Last 5 Encounters:  01/20/19 278 lb 7.1 oz (126.3 kg)  01/11/19 282 lb 12.8 oz (128.3 kg)  12/30/18 280 lb (127 kg)  12/15/18 280 lb (127 kg)  11/04/18 280 lb (127 kg)     Body mass index is 37.76 kg/m.   Current tobacco use? No       Labs:  Lipid Panel     Component Value Date/Time   CHOL 167 12/30/2018 2137   TRIG 83 12/30/2018 2137   HDL 39 (L) 12/30/2018 2137   CHOLHDL 4.3 12/30/2018 2137   VLDL 17 12/30/2018 2137   LDLCALC 111 (H) 12/30/2018 2137   LDLDIRECT 153.1 06/09/2011 0942    Lab Results  Component Value Date   HGBA1C 5.4 12/31/2018   CBG (last 3)  No results for input(s): GLUCAP in the last 72 hours.  Nutrition Diagnosis Food-and nutrition-related knowledge deficit related to lack of exposure to information as related to diagnosis of: ? CVD ? Prediabetes Nutrition Goal(s):  ? To be determined  Plan:  Pt to attend nutrition classes ? Nutrition I ? Nutrition II ? Portion Distortion  ? Diabetes Blitz ? Diabetes Q & A Will provide client-centered nutrition education as part of interdisciplinary care.   Monitor and evaluate progress toward nutrition goal with team.  Laurina Bustle, MS, RD, LDN 01/20/2019 11:53 AM

## 2019-01-21 ENCOUNTER — Telehealth (HOSPITAL_COMMUNITY): Payer: Self-pay

## 2019-01-21 DIAGNOSIS — N401 Enlarged prostate with lower urinary tract symptoms: Secondary | ICD-10-CM | POA: Diagnosis not present

## 2019-01-21 DIAGNOSIS — R351 Nocturia: Secondary | ICD-10-CM | POA: Diagnosis not present

## 2019-01-21 NOTE — Telephone Encounter (Signed)
Pt wife Hassan Rowan called again and request pt class time to be changed to 815.

## 2019-01-23 ENCOUNTER — Encounter (HOSPITAL_BASED_OUTPATIENT_CLINIC_OR_DEPARTMENT_OTHER): Payer: Medicare Other

## 2019-01-24 ENCOUNTER — Encounter (HOSPITAL_COMMUNITY)
Admission: RE | Admit: 2019-01-24 | Discharge: 2019-01-24 | Disposition: A | Discharge status: Home / Self Care | Payer: Medicare Other | Source: Ambulatory Visit | Attending: Cardiology | Admitting: Cardiology

## 2019-01-24 ENCOUNTER — Ambulatory Visit (HOSPITAL_COMMUNITY): Payer: Medicare Other

## 2019-01-24 ENCOUNTER — Encounter (HOSPITAL_COMMUNITY): Payer: Self-pay

## 2019-01-24 DIAGNOSIS — Z955 Presence of coronary angioplasty implant and graft: Secondary | ICD-10-CM

## 2019-01-24 DIAGNOSIS — I2119 ST elevation (STEMI) myocardial infarction involving other coronary artery of inferior wall: Secondary | ICD-10-CM

## 2019-01-24 NOTE — Progress Notes (Signed)
Daily Session Note  Patient Details  Name: [name] MRN: [id number] Date of Birth: [birth year] Referring Provider:   Flowsheet Row CARDIAC REHAB PHASE II ORIENTATION from 01/20/2019 in [facility]  Referring Provider  Dr. Radford Pax      Encounter Date: 01/24/2019  Check In: Session Check In - 01/24/19 0909    Check-In          Supervising physician immediately available to respond to emergencies  [facility] immediately available    Physician(s)  Dr. Ree Kida    Location  [facility]    Staff Present  Jiles Garter, RN, Mosie Epstein, MS,ACSM CEP, Exercise Physiologist;Maria Whitaker, RN, BSN;Joann Rion, RN, BSN    Medication changes reported      No    Fall or balance concerns reported     No    Tobacco Cessation  No Change    Warm-up and Cool-down  Performed as group-led Higher education careers adviser Performed  Yes    VAD Patient?  No    PAD/SET Patient?  No        Pain Assessment          Currently in Pain?  No/denies           Capillary Blood Glucose: No results found for this or any previous visit (from the past 24 hour(s)).  Exercise Prescription Changes - 01/24/19 0900    Response to Exercise          Blood Pressure (Admit)  128/70    Blood Pressure (Exercise)  140/70    Blood Pressure (Exit)  132/80    Heart Rate (Admit)  85 bpm    Heart Rate (Exercise)  107 bpm    Heart Rate (Exit)  84 bpm    Rating of Perceived Exertion (Exercise)  11    Perceived Dyspnea (Exercise)  0    Symptoms  None    Comments  Pt's first day of exercise     Duration  Progress to 30 minutes of  aerobic without signs/symptoms of physical distress    Intensity  THRR unchanged        Progression          Progression  Continue to progress workloads to maintain intensity without signs/symptoms of physical distress.    Average METs  2.83        Resistance Training          Training Prescription  Yes    Weight  4lbs     Reps  10-15    Time  10 Minutes        Treadmill          MPH  2.5    Grade  10    Minutes  10    METs  3.26        NuStep          Level  3    SPM  85    Minutes  20    METs  2.4           Social History   Tobacco Use  Smoking Status Never Smoker  Smokeless Tobacco Never Used    Goals Met:  Exercise tolerated well  Goals Unmet:  Not Applicable  Comments: Pt started cardiac rehab today.  Pt tolerated light exercise without difficulty. VSS, telemetry-sinus rhythm,  asymptomatic.  Medication list reconciled. Pt denies barriers to medicaiton compliance.  PSYCHOSOCIAL ASSESSMENT:  PHQ-0.Marland Kitchen Pt  exhibits positive coping skills, hopeful outlook with supportive family. No psychosocial needs identified at this time, no psychosocial interventions necessary.   Pt oriented to exercise equipment and routine.    Understanding verbalized.  Andi Hence, RN, BSN Cardiac Pulmonary Rehab  01/24/19 3:59 PM    Dr. Fransico Him is Medical Director for Cardiac Rehab at [facility].

## 2019-01-26 ENCOUNTER — Encounter (HOSPITAL_COMMUNITY)
Admission: RE | Admit: 2019-01-26 | Discharge: 2019-01-26 | Disposition: A | Discharge status: Home / Self Care | Payer: Medicare Other | Source: Ambulatory Visit | Attending: Cardiology | Admitting: Cardiology

## 2019-01-26 ENCOUNTER — Ambulatory Visit (HOSPITAL_COMMUNITY): Payer: Medicare Other

## 2019-01-26 DIAGNOSIS — I2119 ST elevation (STEMI) myocardial infarction involving other coronary artery of inferior wall: Secondary | ICD-10-CM | POA: Diagnosis not present

## 2019-01-26 DIAGNOSIS — Z955 Presence of coronary angioplasty implant and graft: Secondary | ICD-10-CM

## 2019-01-27 ENCOUNTER — Ambulatory Visit (HOSPITAL_BASED_OUTPATIENT_CLINIC_OR_DEPARTMENT_OTHER): Payer: Medicare Other | Attending: Cardiology | Admitting: Cardiology

## 2019-01-27 VITALS — Ht 72.0 in | Wt 285.0 lb

## 2019-01-27 DIAGNOSIS — G4733 Obstructive sleep apnea (adult) (pediatric): Secondary | ICD-10-CM | POA: Insufficient documentation

## 2019-01-27 DIAGNOSIS — G4719 Other hypersomnia: Secondary | ICD-10-CM | POA: Diagnosis not present

## 2019-01-27 DIAGNOSIS — G479 Sleep disorder, unspecified: Secondary | ICD-10-CM

## 2019-01-28 ENCOUNTER — Ambulatory Visit (HOSPITAL_COMMUNITY): Payer: Medicare Other

## 2019-01-28 ENCOUNTER — Encounter (HOSPITAL_COMMUNITY)
Admission: RE | Admit: 2019-01-28 | Discharge: 2019-01-28 | Disposition: A | Discharge status: Home / Self Care | Payer: Medicare Other | Source: Ambulatory Visit | Attending: Cardiology | Admitting: Cardiology

## 2019-01-28 DIAGNOSIS — I2119 ST elevation (STEMI) myocardial infarction involving other coronary artery of inferior wall: Secondary | ICD-10-CM | POA: Diagnosis not present

## 2019-01-28 DIAGNOSIS — Z955 Presence of coronary angioplasty implant and graft: Secondary | ICD-10-CM

## 2019-01-30 NOTE — Procedures (Signed)
   Patient Name: Troye, Hiemstra Date: 01/27/2019 Gender: Male D.O.B: [birth year] Age (years): 29 Referring Provider: Fransico Him MD, ABSM Height (inches): 72 Interpreting Physician: Fransico Him MD, ABSM Weight (lbs): 280 RPSGT: Jorge Ny BMI: 38 MRN: [id number] Neck Size: 18.00  CLINICAL INFORMATION The patient is referred for a BiPAP titration to treat sleep apnea.  SLEEP STUDY TECHNIQUE As per the AASM Manual for the Scoring of Sleep and Associated Events v2.3 (April 2016) with a hypopnea requiring 4% desaturations.  The channels recorded and monitored were frontal, central and occipital EEG, electrooculogram (EOG), submentalis EMG (chin), nasal and oral airflow, thoracic and abdominal wall motion, anterior tibialis EMG, snore microphone, electrocardiogram, and pulse oximetry. Bilevel positive airway pressure (BPAP) was initiated at the beginning of the study and titrated to treat sleep-disordered breathing.  MEDICATIONS Medications self-administered by patient taken the night of the study : MELATONIN  RESPIRATORY PARAMETERS Optimal IPAP Pressure (cm): N/A  AHI at Optimal Pressure (/hr)N/A Optimal EPAP Pressure (cm):N/A  Overall Minimal O2 (%):85.0  Minimal O2 at Optimal Pressure (%): N/A  SLEEP ARCHITECTURE Start Time:10:42:22 PM  Stop Time:4:45:04 AM  Total Time (min):362.7  Total Sleep Time (min):313 Sleep Latency (min):1.1  Sleep Efficiency (%):86.3%  REM Latency (min):78.5  WASO (min): 48.6 Stage N1 (%): 8.5%  Stage N2 (%): 73.6% Stage N3 (%): 0.0%  Stage R (%):17.9 Supine (%):57.35  Arousal Index (/hr):28.8   CARDIAC DATA The 2 lead EKG demonstrated sinus rhythm. The mean heart rate was 48.9 beats per minute. Other EKG findings include: None.  LEG MOVEMENT DATA The total Periodic Limb Movements of Sleep (PLMS) were 0. The PLMS index was 0.0. A PLMS index of <15 is considered normal in adults.  IMPRESSIONS - An optimal PAP pressure could not  be selected for this patient based on the available study data due to ongoing central events. - Moderate Central Sleep Apnea was noted during this titration (CAI = 17.8/h). - Moderete oxygen desaturations were observed during this titration (min O2 = 85.0%). - The patient snored with soft snoring volume. - No cardiac abnormalities were observed during this study. - Clinically significant periodic limb movements were not noted during this study. Arousals associated with PLMs were rare.  DIAGNOSIS - Obstructive Sleep Apnea (327.23 [G47.33 ICD-10])  RECOMMENDATIONS - Recommend repeat BiPAP titration using BiPAP ASV. - Avoid alcohol, sedatives and other CNS depressants that may worsen sleep apnea and disrupt normal sleep architecture. - Sleep hygiene should be reviewed to assess factors that may improve sleep quality. - Weight management and regular exercise should be initiated or continued.  [Electronically signed] 01/30/2019 09:09 AM  Fransico Him MD, ABSM Diplomate, American Board of Sleep Medicine

## 2019-01-31 ENCOUNTER — Ambulatory Visit (HOSPITAL_COMMUNITY): Payer: Medicare Other

## 2019-01-31 ENCOUNTER — Telehealth: Payer: Self-pay

## 2019-01-31 ENCOUNTER — Encounter (HOSPITAL_COMMUNITY)
Admission: RE | Admit: 2019-01-31 | Discharge: 2019-01-31 | Disposition: A | Discharge status: Home / Self Care | Payer: Medicare Other | Source: Ambulatory Visit | Attending: Cardiology | Admitting: Cardiology

## 2019-01-31 DIAGNOSIS — I2119 ST elevation (STEMI) myocardial infarction involving other coronary artery of inferior wall: Secondary | ICD-10-CM | POA: Diagnosis not present

## 2019-01-31 DIAGNOSIS — Z955 Presence of coronary angioplasty implant and graft: Secondary | ICD-10-CM | POA: Insufficient documentation

## 2019-01-31 NOTE — Progress Notes (Signed)
[name] 68 y.o. male DOB: 1951-05-15 MRN: [id number]      Nutrition Note  1. Acute ST elevation myocardial infarction (STEMI) of inferior wall ([name]), 12/30/18   2. Status post coronary artery stent placement,DES RPDA 12/30/18    Past Medical History:  Diagnosis Date  . Arthritis    Knees  . BPH (benign prostatic hypertrophy)   . COLONIC POLYPS, HX OF 2007   clear colo w/o polyps 04/2015: 1yr follow up  . Coronary artery disease   . HYPERTENSION   . HYPOTHYROIDISM   . OBESITY   . PAF (paroxysmal atrial fibrillation) ([place]) 10/01/2018   Meds reviewed.   Current Outpatient Medications (Endocrine & Metabolic):  .  levothyroxine (SYNTHROID, LEVOTHROID) 150 MCG tablet, TAKE 1 TABLET BY MOUTH EVERY DAY  Current Outpatient Medications (Cardiovascular):  .  atorvastatin (LIPITOR) 80 MG tablet, Take 1 tablet (80 mg total) by mouth daily at 6 PM. .  losartan (COZAAR) 50 MG tablet, Take 1 tablet (50 mg total) by mouth daily. .  metoprolol succinate (TOPROL-XL) 25 MG 24 hr tablet, Take 1 tablet (25 mg total) by mouth daily. .  nitroGLYCERIN (NITROSTAT) 0.4 MG SL tablet, Place 1 tablet (0.4 mg total) under the tongue every 5 (five) minutes as needed for chest pain.   Current Outpatient Medications (Analgesics):  .  acetaminophen (TYLENOL) 325 MG tablet, Take 2 tablets (650 mg total) by mouth every 4 (four) hours as needed for headache or mild pain. Marland Kitchen  aspirin 81 MG chewable tablet, Chew 1 tablet (81 mg total) by mouth daily.  Current Outpatient Medications (Hematological):  .  ticagrelor (BRILINTA) 90 MG TABS tablet, Take 1 tablet (90 mg total) by mouth 2 (two) times daily.  Current Outpatient Medications (Other):  .  baclofen (LIORESAL) 10 MG tablet, Take 1 tablet (10 mg total) by mouth every 8 (eight) hours as needed for muscle spasms. .  Multiple Vitamins-Minerals (MULTIVITAMIN) tablet, Take 1 tablet by mouth daily.   HT: Ht Readings from Last 1 Encounters:  01/27/19 6' (1.829 m)     WT: Wt Readings from Last 5 Encounters:  01/27/19 285 lb (129.3 kg)  01/20/19 278 lb 7.1 oz (126.3 kg)  01/11/19 282 lb 12.8 oz (128.3 kg)  12/30/18 280 lb (127 kg)  12/15/18 280 lb (127 kg)     BMI = 38.25  Current tobacco use? No  Labs:  Lipid Panel     Component Value Date/Time   CHOL 167 12/30/2018 2137   TRIG 83 12/30/2018 2137   HDL 39 (L) 12/30/2018 2137   CHOLHDL 4.3 12/30/2018 2137   VLDL 17 12/30/2018 2137   LDLCALC 111 (H) 12/30/2018 2137   LDLDIRECT 153.1 06/09/2011 0942    Lab Results  Component Value Date   HGBA1C 5.4 12/31/2018   CBG (last 3)  No results for input(s): GLUCAP in the last 72 hours.  Nutrition Note Spoke with pt. Nutrition plan and goals reviewed with pt. Pt is following Step 2 of the Therapeutic Lifestyle Changes diet. Pt wants to lose wt. Pt has not actively been trying to lose wt, just thinking about it. Heart healthy prediabetic weight loss tips reviewed (label reading, how to build a healthy plate, portion sizes, weighing and measuring, keeping a food journal, eating frequently across the day). Pt with prediabetes, last A1C was 5.4. Discussed the differences between complex and refined carbs, recommended pt replace refined carbs with complex. Reviewed the benefits swapping in complex carbs and moderating portion sizes can  have on managing blood glucose with patient. Per discussion, pt does not use canned/convenience foods often. Pt does not add salt to food. Pt does not eat out frequently. Pt expressed understanding of the information reviewed. Pt aware of nutrition education classes offered and would like to attend nutrition classes.  Nutrition Diagnosis ? Food-and nutrition-related knowledge deficit related to lack of exposure to information as related to diagnosis of: ? CVD ? Pre-diabetes ? Obese  II = 35-39.9 related to excessive energy intake as evidenced by a 38.25  Nutrition Intervention ? Pt's individual nutrition plan and goals  reviewed with pt. ? Pt given handouts for: ? Nutrition I class ? Nutrition II class   ? pre-diabetes  Nutrition Goal(s):  ? Pt to identify and limit food sources of saturated fat, trans fat, refined carbohydrates and sodium ? Pt to identify food quantities necessary to achieve weight loss of 6-24 lb at graduation from cardiac rehab.  ? Pt to build a healthy plate including vegetables, fruits, whole grains, lean protein and low-fat dairy products in a heart healthy meal plan. ? Pt to weigh and measure serving sizes  Plan:  ? Pt to attend nutrition classes:  ? Nutrition I ? Nutrition II ? Portion Distortion  ? Will provide client-centered nutrition education as part of interdisciplinary care ? Monitor and evaluate progress toward nutrition goal with team.   Laurina Bustle, MS, RD, LDN 01/31/2019 9:07 AM

## 2019-01-31 NOTE — Telephone Encounter (Signed)
Spoke to patient and he told me that this has been arranged and is in progress.  He thanked me for the call.

## 2019-01-31 NOTE — Telephone Encounter (Signed)
-----   Message from Consuelo Pandy, [place] sent at 01/31/2019 11:53 AM EST ----- Regarding: outpatient dietitian referral [name],  Is there anyway we can refer this pt to a dietitian? He had a recent STEMI. If able, can you follow-up with pt to let him know what additional steps need to be taken. My guess is that if we place the referral, someone will call him?  Thanks    Hi [name],   Cardiac rehab usually will help provide good information regarding diet, but I can see about placing a referral for you to see an actual specialist. I will route the info to my nurse to set up. I hope that you are still doing well!   ===View-only below this line===   ----- Message -----    From: [name]    Sent: 01/29/2019  6:49 PM EST      To: [name], PA-C Subject: RE: Non-Urgent Medical Question  When I was in the hospital and seen afterwards for my followup visit we discussed setting up an appointment with a dietician.  I was told they would call to set up the appointment.  I have started cardiac rehab this past week and would like to see a dietician also.  Thank you for the referral.

## 2019-01-31 NOTE — Telephone Encounter (Signed)
-----   Message from Consuelo Pandy, [place] sent at 01/31/2019 11:53 AM EST ----- Regarding: outpatient dietitian referral [name],  Is there anyway we can refer this pt to a dietitian? He had a recent STEMI. If able, can you follow-up with pt to let him know what additional steps need to be taken. My guess is that if we place the referral, someone will call him?  Thanks    Hi [name],   Cardiac rehab usually will help provide good information regarding diet, but I can see about placing a referral for you to see an actual specialist. I will route the info to my nurse to set up. I hope that you are still doing well!   ===View-only below this line===   ----- Message -----    From: [name]    Sent: 01/29/2019  6:49 PM EST      To: [name], PA-C Subject: RE: Non-Urgent Medical Question  When I was in the hospital and seen afterwards for my followup visit we discussed setting up an appointment with a dietician.  I was told they would call to set up the appointment.  I have started cardiac rehab this past week and would like to see a dietician also.  Thank you for the referral.

## 2019-02-01 ENCOUNTER — Telehealth: Payer: Self-pay | Admitting: *Deleted

## 2019-02-01 NOTE — Telephone Encounter (Signed)
-----   Message from Sueanne Margarita, MD sent at 01/30/2019  9:14 AM EST ----- Please let patient know that they had a unsuccessful PAP titration due to severity of OSA and refer back to sleep lab for BiPAP ASV at sleep lab

## 2019-02-01 NOTE — Telephone Encounter (Signed)
Informed patient of titration results and verbalized understanding was indicated. Patient understands his sleep study showed they had a unsuccessful PAP titration due to severity of OSA and he is refered back to sleep lab for BiPAP ASV at sleep lab. Note to Dr Radford Pax:  Patient is holding off on returning to the lab for the Bipap ASV until he talks to you concerning his Bipap ASV study. He feels like he is going to have to go back anyway but him and his wife just needs some clarity concerning the whole process.  Patient wonders if he goes back for a 4th time to the lab, how is he guaranteed this time will be successful. Patient contact information is [phone].

## 2019-02-02 ENCOUNTER — Ambulatory Visit (HOSPITAL_COMMUNITY): Payer: Medicare Other

## 2019-02-02 ENCOUNTER — Encounter (HOSPITAL_COMMUNITY)
Admission: RE | Admit: 2019-02-02 | Discharge: 2019-02-02 | Disposition: A | Discharge status: Home / Self Care | Payer: Medicare Other | Source: Ambulatory Visit | Attending: Cardiology | Admitting: Cardiology

## 2019-02-02 ENCOUNTER — Other Ambulatory Visit: Payer: Self-pay | Admitting: Internal Medicine

## 2019-02-02 DIAGNOSIS — Z955 Presence of coronary angioplasty implant and graft: Secondary | ICD-10-CM | POA: Diagnosis not present

## 2019-02-02 DIAGNOSIS — I2119 ST elevation (STEMI) myocardial infarction involving other coronary artery of inferior wall: Secondary | ICD-10-CM | POA: Diagnosis not present

## 2019-02-03 ENCOUNTER — Telehealth: Payer: Self-pay | Admitting: *Deleted

## 2019-02-03 DIAGNOSIS — G479 Sleep disorder, unspecified: Secondary | ICD-10-CM

## 2019-02-03 DIAGNOSIS — G4719 Other hypersomnia: Secondary | ICD-10-CM

## 2019-02-03 NOTE — Telephone Encounter (Signed)
I personally talked with Mr. [name] and reviewed all the findings of his sleep studies and BiPAP titration. He has complex sleep apnea with both central and obstructive sleep apnea. He has failed CPAP and BiPAP and I think he would be an excellent candidate for BiPAP ASV. He is willing to go back to the sleep lab for this. Please set up a sleep lab office visit for BiPAP ASV titration ASAP. He will need a sleep aid so please set him up with Lunesta 3 mg (#1 tablet only) with no refills.     Fransico Him, DM

## 2019-02-03 NOTE — Progress Notes (Signed)
Cardiac Individual Treatment Plan  Patient Details  Name: [name] MRN: [id number] Date of Birth: [birth year] Referring Provider:   Flowsheet Row CARDIAC REHAB PHASE II ORIENTATION from 01/20/2019 in [place]  Referring Provider  Dr. Radford Pax      Initial Encounter Date:  Flowsheet Row CARDIAC REHAB PHASE II ORIENTATION from 01/20/2019 in [place]  Date  01/20/19      Visit Diagnosis: Acute ST elevation myocardial infarction (STEMI) of inferior wall ([place]), 12/30/18  Status post coronary artery stent placement,DES RPDA 12/30/18  Patient's Home Medications on Admission:  Current Outpatient Medications:  .  acetaminophen (TYLENOL) 325 MG tablet, Take 2 tablets (650 mg total) by mouth every 4 (four) hours as needed for headache or mild pain., Disp: , Rfl:  .  aspirin 81 MG chewable tablet, Chew 1 tablet (81 mg total) by mouth daily., Disp: , Rfl:  .  atorvastatin (LIPITOR) 80 MG tablet, Take 1 tablet (80 mg total) by mouth daily at 6 PM., Disp: 90 tablet, Rfl: 3 .  baclofen (LIORESAL) 10 MG tablet, Take 1 tablet (10 mg total) by mouth every 8 (eight) hours as needed for muscle spasms., Disp: 30 each, Rfl: 0 .  levothyroxine (SYNTHROID, LEVOTHROID) 150 MCG tablet, Take 1 tablet (150 mcg total) by mouth daily. -- Office visit needed for further refills, Disp: 90 tablet, Rfl: 0 .  losartan (COZAAR) 50 MG tablet, Take 1 tablet (50 mg total) by mouth daily., Disp: 90 tablet, Rfl: 3 .  metoprolol succinate (TOPROL-XL) 25 MG 24 hr tablet, Take 1 tablet (25 mg total) by mouth daily., Disp: 90 tablet, Rfl: 3 .  Multiple Vitamins-Minerals (MULTIVITAMIN) tablet, Take 1 tablet by mouth daily., Disp: , Rfl:  .  nitroGLYCERIN (NITROSTAT) 0.4 MG SL tablet, Place 1 tablet (0.4 mg total) under the tongue every 5 (five) minutes as needed for chest pain., Disp: 25 tablet, Rfl: 5 .  ticagrelor (BRILINTA) 90 MG TABS tablet, Take 1 tablet (90  mg total) by mouth 2 (two) times daily., Disp: 180 tablet, Rfl: 3  Past Medical History: Past Medical History:  Diagnosis Date  . Arthritis    Knees  . BPH (benign prostatic hypertrophy)   . COLONIC POLYPS, HX OF 2007   clear colo w/o polyps 04/2015: 62yrfollow up  . Coronary artery disease   . HYPERTENSION   . HYPOTHYROIDISM   . OBESITY   . PAF (paroxysmal atrial fibrillation) ([place] 10/01/2018    Tobacco Use: Social History   Tobacco Use  Smoking Status Never Smoker  Smokeless Tobacco Never Used    Labs: Recent Review Flowsheet Data    Labs for ITP Cardiac and Pulmonary Rehab Latest Ref Rng & Units 08/19/2018 12/30/2018 12/30/2018 12/30/2018 12/31/2018   Cholestrol 0 - 200 mg/dL 179 198 - 167 -   LDLCALC 0 - 99 mg/dL 112(H) 93 - 111(H) -   LDLDIRECT mg/dL - - - - -   HDL >40 mg/dL 48.10 41 - 39(L) -   Trlycerides <150 mg/dL 93.0 322(H) - 83 -   Hemoglobin A1c 4.8 - 5.6 % 5.9 - - - 5.4   TCO2 22 - 32 mmol/L - - 24 - -      Capillary Blood Glucose: No results found for: GLUCAP   Exercise Target Goals: Exercise Program Goal: Individual exercise prescription set using results from initial 6 min walk test and THRR while considering  patient's activity barriers and safety.  Exercise Prescription Goal: Initial exercise prescription builds to 30-45 minutes a day of aerobic activity, 2-3 days per week.  Home exercise guidelines will be given to patient during program as part of exercise prescription that the participant will acknowledge.  Activity Barriers & Risk Stratification: Activity Barriers & Cardiac Risk Stratification - 01/20/19 0942    Activity Barriers & Cardiac Risk Stratification          Activity Barriers  Left Knee Replacement;Right Hip Replacement;Right Knee Replacement;Left Hip Replacement;Back Problems    Cardiac Risk Stratification  High           6 Minute Walk: 6 Minute Walk    6 Minute Walk    Row Name 01/20/19 0941   Phase  Initial   Distance  1453  feet   Walk Time  6 minutes   # of Rest Breaks  0   MPH  2.7   METS  2.9   RPE  11   VO2 Peak  10.41   Symptoms  No   Resting HR  69 bpm   Resting BP  122/70   Resting Oxygen Saturation   97 %   Exercise Oxygen Saturation  during 6 min walk  97 %   Max Ex. HR  107 bpm   Max Ex. BP  140/78   2 Minute Post BP  114/82          Oxygen Initial Assessment:   Oxygen Re-Evaluation:   Oxygen Discharge (Final Oxygen Re-Evaluation):   Initial Exercise Prescription: Initial Exercise Prescription - 01/20/19 0900    Date of Initial Exercise RX and Referring Provider          Date  01/20/19    Referring Provider  Dr. Radford Pax    Expected Discharge Date  04/25/19        Treadmill          MPH  2.5    Grade  1    Minutes  10        Bike          Level  1    Minutes  10    METs  2.51        NuStep          Level  3    SPM  85    Minutes  10    METs  2.5        Prescription Details          Frequency (times per week)  3    Duration  Progress to 30 minutes of continuous aerobic without signs/symptoms of physical distress        Intensity          THRR 40-80% of Max Heartrate  62-124    Ratings of Perceived Exertion  11-13        Progression          Progression  Continue to progress workloads to maintain intensity without signs/symptoms of physical distress.        Resistance Training          Training Prescription  Yes    Weight  4 lbs.    Reps  10-15           Perform Capillary Blood Glucose checks as needed.  Exercise Prescription Changes: Exercise Prescription Changes    Response to Exercise    Row Name 01/24/19 0900 01/31/19 1000   Blood Pressure (Admit)  128/70  120/82   Blood  Pressure (Exercise)  140/70  130/68   Blood Pressure (Exit)  132/80  120/72   Heart Rate (Admit)  85 bpm  68 bpm   Heart Rate (Exercise)  107 bpm  122 bpm   Heart Rate (Exit)  84 bpm  75 bpm   Rating of Perceived Exertion (Exercise)  11  14   Perceived Dyspnea  (Exercise)  0  0   Symptoms  None  None   Comments  Pt's first day of exercise   no documentation   Duration  Progress to 30 minutes of  aerobic without signs/symptoms of physical distress  Progress to 30 minutes of  aerobic without signs/symptoms of physical distress   Intensity  THRR unchanged  THRR unchanged       Progression    Row Name 01/24/19 0900 01/31/19 1000   Progression  Continue to progress workloads to maintain intensity without signs/symptoms of physical distress.  Continue to progress workloads to maintain intensity without signs/symptoms of physical distress.   Average METs  2.83  4.8       Resistance Training    Row Name 01/24/19 0900 01/31/19 1000   Training Prescription  Yes  Yes   Weight  4lbs  4lbs   Reps  10-15  10-15   Time  10 Minutes  10 Minutes       Treadmill    Row Name 01/24/19 0900 01/31/19 1000   MPH  2.5  2.6   Grade  10  2   Minutes  10  10   METs  3.26  3.71       NuStep    Row Name 01/24/19 0900 01/31/19 1000   Level  3  3   SPM  85  85   Minutes  20  10   METs  2.4  4       Elliptical    Row Name 01/24/19 0900 01/31/19 1000   Level  no documentation  1   Speed  no documentation  1   Minutes  no documentation  10   METs  no documentation  6.7          Exercise Comments: Exercise Comments    Row Name 01/24/19 0940 02/02/19 0959   Exercise Comments  Pt's first day of exercise. Pt oriented to exercise equipment. Pt reported knee pain with [employer] bike. Will remove from exercise prescription. Will continue to monitor pt and progress as tolerated.   Reviewed METs and goals with Pt. Pt is tolerating exercise Rx well.       Exercise Goals and Review: Exercise Goals    Exercise Goals    Row Name 01/20/19 0942   Increase Physical Activity  Yes   Intervention  Provide advice, education, support and counseling about physical activity/exercise needs.;Develop an individualized exercise prescription for aerobic and resistive training  based on initial evaluation findings, risk stratification, comorbidities and participant's personal goals.   Expected Outcomes  Short Term: Attend rehab on a regular basis to increase amount of physical activity.   Increase Strength and Stamina  Yes   Intervention  Provide advice, education, support and counseling about physical activity/exercise needs.;Develop an individualized exercise prescription for aerobic and resistive training based on initial evaluation findings, risk stratification, comorbidities and participant's personal goals.   Expected Outcomes  Short Term: Increase workloads from initial exercise prescription for resistance, speed, and METs.   Able to understand and use rate of perceived exertion (RPE) scale  Yes  Intervention  Provide education and explanation on how to use RPE scale   Expected Outcomes  Short Term: Able to use RPE daily in rehab to express subjective intensity level;Long Term:  Able to use RPE to guide intensity level when exercising independently   Knowledge and understanding of Target Heart Rate Range (THRR)  Yes   Intervention  Provide education and explanation of THRR including how the numbers were predicted and where they are located for reference   Expected Outcomes  Short Term: Able to state/look up THRR;Long Term: Able to use THRR to govern intensity when exercising independently;Short Term: Able to use daily as guideline for intensity in rehab   Able to check pulse independently  Yes   Intervention  Provide education and demonstration on how to check pulse in carotid and radial arteries.;Review the importance of being able to check your own pulse for safety during independent exercise   Expected Outcomes  Short Term: Able to explain why pulse checking is important during independent exercise;Long Term: Able to check pulse independently and accurately   Understanding of Exercise Prescription  Yes   Intervention  Provide education, explanation, and written  materials on patient's individual exercise prescription   Expected Outcomes  Short Term: Able to explain program exercise prescription;Long Term: Able to explain home exercise prescription to exercise independently          Exercise Goals Re-Evaluation : Exercise Goals Re-Evaluation    Exercise Goal Re-Evaluation    Row Name 02/02/19 0956   Exercise Goals Review  Increase Physical Activity;Increase Strength and Stamina;Able to understand and use rate of perceived exertion (RPE) scale;Knowledge and understanding of Target Heart Rate Range (THRR);Understanding of Exercise Prescription   Comments  Reviewed METs and goals with Pt. Pt is new to the program and has a MET level of 4.8. Tolerates exercise Rx well and improves with each session. Pt is going to a local gym 2-4 days per week and exercising 30-50 minutes in addition to the program.    Expected Outcomes  Will continue to monitor and progress Pt as tolerated.           Discharge Exercise Prescription (Final Exercise Prescription Changes): Exercise Prescription Changes - 01/31/19 1000    Response to Exercise          Blood Pressure (Admit)  120/82    Blood Pressure (Exercise)  130/68    Blood Pressure (Exit)  120/72    Heart Rate (Admit)  68 bpm    Heart Rate (Exercise)  122 bpm    Heart Rate (Exit)  75 bpm    Rating of Perceived Exertion (Exercise)  14    Perceived Dyspnea (Exercise)  0    Symptoms  None    Duration  Progress to 30 minutes of  aerobic without signs/symptoms of physical distress    Intensity  THRR unchanged        Progression          Progression  Continue to progress workloads to maintain intensity without signs/symptoms of physical distress.    Average METs  4.8        Resistance Training          Training Prescription  Yes    Weight  4lbs    Reps  10-15    Time  10 Minutes        Treadmill          MPH  2.6    Grade  2  Minutes  10    METs  3.71        NuStep          Level  3     SPM  85    Minutes  10    METs  4        Elliptical          Level  1    Speed  1    Minutes  10    METs  6.7           Nutrition:  Target Goals: Understanding of nutrition guidelines, daily intake of sodium <1520m, cholesterol <2089m calories 30% from fat and 7% or less from saturated fats, daily to have 5 or more servings of fruits and vegetables.  Biometrics: Pre Biometrics - 01/20/19 0925    Pre Biometrics          Waist Circumference  51.5 inches    Hip Circumference  47 inches    Waist to Hip Ratio  1.1 %    Triceps Skinfold  31 mm    % Body Fat  39.1 %    Grip Strength  33 kg    Flexibility  0 in    Single Leg Stand  3.75 seconds            Nutrition Therapy Plan and Nutrition Goals: Nutrition Therapy & Goals - 01/31/19 0915    Nutrition Therapy          Diet  heart healthy, carb modified        Personal Nutrition Goals          Nutrition Goal  Pt to identify and limit food sources of saturated fat, trans fat, refined carbohydrates and sodium    Personal Goal #2  Pt to identify food quantities necessary to achieve weight loss of 6-24 lb at graduation from cardiac rehab.     Personal Goal #3  Pt to build a healthy plate including vegetables, fruits, whole grains, lean protein and low-fat dairy products in a heart healthy meal plan.    Personal Goal #4  Pt to weigh and measure serving sizes        Intervention Plan          Intervention  Prescribe, educate and counsel regarding individualized specific dietary modifications aiming towards targeted core components such as weight, hypertension, lipid management, diabetes, heart failure and other comorbidities.    Expected Outcomes  Short Term Goal: Understand basic principles of dietary content, such as calories, fat, sodium, cholesterol and nutrients.;Long Term Goal: Adherence to prescribed nutrition plan.           Nutrition Assessments: Nutrition Assessments - 01/20/19 1153    MEDFICTS Scores           Pre Score  21           Nutrition Goals Re-Evaluation: Nutrition Goals Re-Evaluation    Goals    Row Name 01/20/19 1154 01/31/19 0915   Current Weight  278 lb 7.1 oz (126.3 kg)  278 lb 7.1 oz (126.3 kg)          Nutrition Goals Re-Evaluation: Nutrition Goals Re-Evaluation    Goals    Row Name 01/20/19 1154 01/31/19 0915   Current Weight  278 lb 7.1 oz (126.3 kg)  278 lb 7.1 oz (126.3 kg)          Nutrition Goals Discharge (Final Nutrition Goals Re-Evaluation): Nutrition Goals Re-Evaluation - 01/31/19 0915  Goals          Current Weight  278 lb 7.1 oz (126.3 kg)           Psychosocial: Target Goals: Acknowledge presence or absence of significant depression and/or stress, maximize coping skills, provide positive support system. Participant is able to verbalize types and ability to use techniques and skills needed for reducing stress and depression.  Initial Review & Psychosocial Screening: Initial Psych Review & Screening - 01/20/19 1039    Initial Review          Current issues with  None Identified        Family Dynamics          Good Support System?  Yes   [name] has his wife for support       Barriers          Psychosocial barriers to participate in program  There are no identifiable barriers or psychosocial needs.        Screening Interventions          Interventions  Encouraged to exercise           Quality of Life Scores: Quality of Life - 01/20/19 0944    Quality of Life          Select  Quality of Life        Quality of Life Scores          Health/Function Pre  24.03 %    Socioeconomic Pre  27.5 %    Psych/Spiritual Pre  22.29 %    Family Pre  28.8 %    GLOBAL Pre  25.09 %          Scores of 19 and below usually indicate a poorer quality of life in these areas.  A difference of  2-3 points is a clinically meaningful difference.  A difference of 2-3 points in the total score of the Quality of Life Index has been  associated with significant improvement in overall quality of life, self-image, physical symptoms, and general health in studies assessing change in quality of life.  PHQ-9: Recent Review Flowsheet Data    Depression screen [facility] 2/9 01/24/2019 08/19/2018 05/12/2018 03/30/2014   Decreased Interest 0 0 0 0   Down, Depressed, Hopeless 0 0 0 0   PHQ - 2 Score 0 0 0 0   Altered sleeping - - 2 -   Tired, decreased energy - - 1 -   Change in appetite - - 0 -   Feeling bad or failure about yourself  - - 0 -   Trouble concentrating - - 0 -   Moving slowly or fidgety/restless - - 0 -   Suicidal thoughts - - 0 -   PHQ-9 Score - - 3 -   Difficult doing work/chores - - Not difficult at all -     Interpretation of Total Score  Total Score Depression Severity:  1-4 = Minimal depression, 5-9 = Mild depression, 10-14 = Moderate depression, 15-19 = Moderately severe depression, 20-27 = Severe depression   Psychosocial Evaluation and Intervention: Psychosocial Evaluation - 01/24/19 1536    Psychosocial Evaluation & Interventions          Interventions  Encouraged to exercise with the program and follow exercise prescription    Comments  no psychosocial needs identified, no interventions necessary.  pt enjoys spending time with grandchildren and garden.      Expected Outcomes  pt will exhibit positive outlook with good  coping skills.     Continue Psychosocial Services   No Follow up required           Psychosocial Re-Evaluation: Psychosocial Re-Evaluation    Psychosocial Re-Evaluation    [place] Name 02/02/19 1121   Current issues with  None Identified   Comments  no psychosocial needs identified, no interventions necessary    Expected Outcomes  pt will exhibit positive outlook with good coping skills.    Interventions  Encouraged to attend Cardiac Rehabilitation for the exercise   Continue Psychosocial Services   No Follow up required          Psychosocial Discharge (Final Psychosocial  Re-Evaluation): Psychosocial Re-Evaluation - 02/02/19 1121    Psychosocial Re-Evaluation          Current issues with  None Identified    Comments  no psychosocial needs identified, no interventions necessary     Expected Outcomes  pt will exhibit positive outlook with good coping skills.     Interventions  Encouraged to attend Cardiac Rehabilitation for the exercise    Continue Psychosocial Services   No Follow up required           Vocational Rehabilitation: Provide vocational rehab assistance to qualifying candidates.   Vocational Rehab Evaluation & Intervention: Vocational Rehab - 01/20/19 1042    Initial Vocational Rehab Evaluation & Intervention          Assessment shows need for Vocational Rehabilitation  No   [name] is retired and does not need vocational rehab at this time          Education: Education Goals: Education classes will be provided on a weekly basis, covering required topics. Participant will state understanding/return demonstration of topics presented.  Learning Barriers/Preferences: Learning Barriers/Preferences - 01/20/19 0944    Learning Barriers/Preferences          Learning Barriers  Sight    Learning Preferences  None           Education Topics: Count Your Pulse:  -Group instruction provided by verbal instruction, demonstration, patient participation and written materials to support subject.  Instructors address importance of being able to find your pulse and how to count your pulse when at home without a heart monitor.  Patients get hands on experience counting their pulse with staff help and individually.   Heart Attack, Angina, and Risk Factor Modification:  -Group instruction provided by verbal instruction, video, and written materials to support subject.  Instructors address signs and symptoms of angina and heart attacks.    Also discuss risk factors for heart disease and how to make changes to improve heart health risk  factors.   Functional Fitness:  -Group instruction provided by verbal instruction, demonstration, patient participation, and written materials to support subject.  Instructors address safety measures for doing things around the house.  Discuss how to get up and down off the floor, how to pick things up properly, how to safely get out of a chair without assistance, and balance training.   Meditation and Mindfulness:  -Group instruction provided by verbal instruction, patient participation, and written materials to support subject.  Instructor addresses importance of mindfulness and meditation practice to help reduce stress and improve awareness.  Instructor also leads participants through a meditation exercise.    Stretching for Flexibility and Mobility:  -Group instruction provided by verbal instruction, patient participation, and written materials to support subject.  Instructors lead participants through series of stretches that are designed to increase flexibility thus improving mobility.  These stretches are additional exercise for major muscle groups that are typically performed during regular warm up and cool down.   Hands Only CPR:  -Group verbal, video, and participation provides a basic overview of AHA guidelines for community CPR. Role-play of emergencies allow participants the opportunity to practice calling for help and chest compression technique with discussion of AED use.   Hypertension: -Group verbal and written instruction that provides a basic overview of hypertension including the most recent diagnostic guidelines, risk factor reduction with self-care instructions and medication management.    Nutrition I class: Heart Healthy Eating:  -Group instruction provided by PowerPoint slides, verbal discussion, and written materials to support subject matter. The instructor gives an explanation and review of the Therapeutic Lifestyle Changes diet recommendations, which includes a  discussion on lipid goals, dietary fat, sodium, fiber, plant stanol/sterol esters, sugar, and the components of a well-balanced, healthy diet.   Nutrition II class: Lifestyle Skills:  -Group instruction provided by PowerPoint slides, verbal discussion, and written materials to support subject matter. The instructor gives an explanation and review of label reading, grocery shopping for heart health, heart healthy recipe modifications, and ways to make healthier choices when eating out.   Diabetes Question & Answer:  -Group instruction provided by PowerPoint slides, verbal discussion, and written materials to support subject matter. The instructor gives an explanation and review of diabetes co-morbidities, pre- and post-prandial blood glucose goals, pre-exercise blood glucose goals, signs, symptoms, and treatment of hypoglycemia and hyperglycemia, and foot care basics.   Diabetes Blitz:  -Group instruction provided by PowerPoint slides, verbal discussion, and written materials to support subject matter. The instructor gives an explanation and review of the physiology behind type 1 and type 2 diabetes, diabetes medications and rational behind using different medications, pre- and post-prandial blood glucose recommendations and Hemoglobin A1c goals, diabetes diet, and exercise including blood glucose guidelines for exercising safely.    Portion Distortion:  -Group instruction provided by PowerPoint slides, verbal discussion, written materials, and food models to support subject matter. The instructor gives an explanation of serving size versus portion size, changes in portions sizes over the last 20 years, and what consists of a serving from each food group.   Stress Management:  -Group instruction provided by verbal instruction, video, and written materials to support subject matter.  Instructors review role of stress in heart disease and how to cope with stress positively.     Exercising on Your  Own:  -Group instruction provided by verbal instruction, power point, and written materials to support subject.  Instructors discuss benefits of exercise, components of exercise, frequency and intensity of exercise, and end points for exercise.  Also discuss use of nitroglycerin and activating EMS.  Review options of places to exercise outside of rehab.  Review guidelines for sex with heart disease.   Cardiac Drugs I:  -Group instruction provided by verbal instruction and written materials to support subject.  Instructor reviews cardiac drug classes: antiplatelets, anticoagulants, beta blockers, and statins.  Instructor discusses reasons, side effects, and lifestyle considerations for each drug class.   Cardiac Drugs II:  -Group instruction provided by verbal instruction and written materials to support subject.  Instructor reviews cardiac drug classes: angiotensin converting enzyme inhibitors (ACE-I), angiotensin II receptor blockers (ARBs), nitrates, and calcium channel blockers.  Instructor discusses reasons, side effects, and lifestyle considerations for each drug class.   Anatomy and Physiology of the Circulatory System:  Group verbal and written instruction and models provide basic cardiac anatomy  and physiology, with the coronary electrical and arterial systems. Review of: AMI, Angina, Valve disease, Heart Failure, Peripheral Artery Disease, Cardiac Arrhythmia, Pacemakers, and the ICD.   Other Education:  -Group or individual verbal, written, or video instructions that support the educational goals of the cardiac rehab program.   Holiday Eating Survival Tips:  -Group instruction provided by PowerPoint slides, verbal discussion, and written materials to support subject matter. The instructor gives patients tips, tricks, and techniques to help them not only survive but enjoy the holidays despite the onslaught of food that accompanies the holidays.   Knowledge Questionnaire  Score: Knowledge Questionnaire Score - 01/20/19 0945    Knowledge Questionnaire Score          Pre Score  23/24           Core Components/Risk Factors/Patient Goals at Admission: Personal Goals and Risk Factors at Admission - 01/20/19 0945    Core Components/Risk Factors/Patient Goals on Admission           Weight Management  Yes;Obesity;Weight Maintenance;Weight Loss    Intervention  Weight Management: Develop a combined nutrition and exercise program designed to reach desired caloric intake, while maintaining appropriate intake of nutrient and fiber, sodium and fats, and appropriate energy expenditure required for the weight goal.;Weight Management: Provide education and appropriate resources to help participant work on and attain dietary goals.;Weight Management/Obesity: Establish reasonable short term and long term weight goals.;Obesity: Provide education and appropriate resources to help participant work on and attain dietary goals.    Admit Weight  278 lb 7.1 oz (126.3 kg)    Expected Outcomes  Short Term: Continue to assess and modify interventions until short term weight is achieved;Long Term: Adherence to nutrition and physical activity/exercise program aimed toward attainment of established weight goal;Weight Maintenance: Understanding of the daily nutrition guidelines, which includes 25-35% calories from fat, 7% or less cal from saturated fats, less than 263m cholesterol, less than 1.5gm of sodium, & 5 or more servings of fruits and vegetables daily;Understanding recommendations for meals to include 15-35% energy as protein, 25-35% energy from fat, 35-60% energy from carbohydrates, less than 2046mof dietary cholesterol, 20-35 gm of total fiber daily;Understanding of distribution of calorie intake throughout the day with the consumption of 4-5 meals/snacks;Weight Loss: Understanding of general recommendations for a balanced deficit meal plan, which promotes 1-2 lb weight loss per week  and includes a negative energy balance of [phone] kcal/d    Hypertension  Yes    Intervention  Monitor prescription use compliance.;Provide education on lifestyle modifcations including regular physical activity/exercise, weight management, moderate sodium restriction and increased consumption of fresh fruit, vegetables, and low fat dairy, alcohol moderation, and smoking cessation.    Expected Outcomes  Short Term: Continued assessment and intervention until BP is < 140/9042mG in hypertensive participants. < 130/13m36m in hypertensive participants with diabetes, heart failure or chronic kidney disease.;Long Term: Maintenance of blood pressure at goal levels.    Lipids  Yes    Intervention  Provide education and support for participant on nutrition & aerobic/resistive exercise along with prescribed medications to achieve LDL <70mg52mL >40mg.64mExpected Outcomes  Short Term: Participant states understanding of desired cholesterol values and is compliant with medications prescribed. Participant is following exercise prescription and nutrition guidelines.;Long Term: Cholesterol controlled with medications as prescribed, with individualized exercise RX and with personalized nutrition plan. Value goals: LDL < 70mg, 85m> 40 mg.    Stress  Yes    Intervention  Offer individual and/or small group education and counseling on adjustment to heart disease, stress management and health-related lifestyle change. Teach and support self-help strategies.;Refer participants experiencing significant psychosocial distress to appropriate mental health specialists for further evaluation and treatment. When possible, include family members and significant others in education/counseling sessions.    Expected Outcomes  Short Term: Participant demonstrates changes in health-related behavior, relaxation and other stress management skills, ability to obtain effective social support, and compliance with psychotropic medications if  prescribed.;Long Term: Emotional wellbeing is indicated by absence of clinically significant psychosocial distress or social isolation.           Core Components/Risk Factors/Patient Goals Review:  Goals and Risk Factor Review    Core Components/Risk Factors/Patient Goals Review    Row Name 01/24/19 1538 02/02/19 1121   Personal Goals Review  Weight Management/Obesity;Hypertension;Lipids;Stress  no documentation   Review  pt with multiple CAD RF demonstrates eagerness to participate in CR opportunities. pt personal goals are "peace of mind" with physical activity and safely resume community gym activiites of swimming and treadmiall.   pt with multiple CAD RF demonstrates eagerness to participate in CR opportunities. pt personal goals are "peace of mind" with physical activity and safely resume community gym activiites of swimming and treadmiall. pt rerpots using eliptical (3.0, 1.4 mile)l at the [employer] on off days from CR. pt activity limited by hip pain.     Expected Outcomes  pt will participate in CR exercise, nutrition and lifestyle modification to decrease overall RF.   pt will participate in CR exercise, nutrition and lifestyle modification to decrease overall RF.           Core Components/Risk Factors/Patient Goals at Discharge (Final Review):  Goals and Risk Factor Review - 02/02/19 1121    Core Components/Risk Factors/Patient Goals Review          Review  pt with multiple CAD RF demonstrates eagerness to participate in CR opportunities. pt personal goals are "peace of mind" with physical activity and safely resume community gym activiites of swimming and treadmiall. pt rerpots using eliptical (3.0, 1.4 mile)l at [facility] [employer] on off days from CR. pt activity limited by hip pain.      Expected Outcomes  pt will participate in CR exercise, nutrition and lifestyle modification to decrease overall RF.            ITP Comments: ITP Comments    Row Name 01/20/19 0930 01/24/19 1535 02/02/19  1120   ITP Comments  Dr. Fransico Him, Medical Director  pt started group exercise.  pt tolerated light activity without difficulty. pt oriented to exercise equipment and safety routine. understanding verbalized.  30 day ITP review. pt with good attendance and participation. pt limited by hip pain.       Comments:

## 2019-02-03 NOTE — Telephone Encounter (Signed)
-----   Message from Sueanne Margarita, MD sent at 02/01/2019  2:26 PM EST ----- Regarding: RE: sleep study results He has very severe complex OSA and in patients with this severity it is very common to have to go to lab several times to try different PAP modalities.  Unfortunately we will not be able to treat his OSA with using BiPAP ASV  Traci ----- Message ----- From: Freada Bergeron, CMA Sent: 02/01/2019  12:41 PM EST To: Sueanne Margarita, MD Subject: sleep study results                            Patient is holding off on returning to the lab for the Bipap ASV until he talks to you concerning his Bipap ASV study. He feels like he is going to have to go back anyway but him and his wife just needs some clarity concerning the whole process. Patient wonders if he goes back for a 4th time to the lab, how is he guaranteed this time will be successful. Patient contact information is [phone]. Thanks, Gae Bon

## 2019-02-04 ENCOUNTER — Ambulatory Visit (HOSPITAL_COMMUNITY): Payer: Medicare Other

## 2019-02-04 ENCOUNTER — Telehealth: Payer: Self-pay | Admitting: *Deleted

## 2019-02-04 ENCOUNTER — Encounter (HOSPITAL_COMMUNITY)
Admission: RE | Admit: 2019-02-04 | Discharge: 2019-02-04 | Disposition: A | Discharge status: Home / Self Care | Payer: Medicare Other | Source: Ambulatory Visit | Attending: Cardiology | Admitting: Cardiology

## 2019-02-04 DIAGNOSIS — I2119 ST elevation (STEMI) myocardial infarction involving other coronary artery of inferior wall: Secondary | ICD-10-CM | POA: Diagnosis not present

## 2019-02-04 DIAGNOSIS — Z955 Presence of coronary angioplasty implant and graft: Secondary | ICD-10-CM

## 2019-02-04 NOTE — Progress Notes (Signed)
I have reviewed a Home Exercise Prescription with [name] . Aundrea is currently exercising at home and at a local gym.  The patient was advised to walk 2-4  days a week for 30-45 minutes.  Jarelle and I discussed how to progress their exercise prescription.  The patient stated that their goals were to continue exercising by walking and doing aquatics at his local gym.  The patient stated that they understand the exercise prescription.  We reviewed exercise guidelines, target heart rate during exercise, RPE Scale, weather conditions, NTG use, endpoints for exercise, warmup and cool down.  Patient is encouraged to come to me with any questions. I will continue to follow up with the patient to assist them with progression and safety.    02/04/2019  9:52 AM  Deitra Mayo BS, ACSM CEP

## 2019-02-04 NOTE — Telephone Encounter (Signed)
Staff message sent to [employer] does not require PA. Ok to schedule BIPAP ASV titration.

## 2019-02-04 NOTE — Telephone Encounter (Signed)
-----   Message from Freada Bergeron, [place] sent at 02/03/2019  5:33 PM EST ----- Regarding: precert  BiPAP ASV titration ASAP

## 2019-02-07 ENCOUNTER — Ambulatory Visit (HOSPITAL_COMMUNITY): Payer: Medicare Other

## 2019-02-07 ENCOUNTER — Encounter (HOSPITAL_COMMUNITY)
Admission: RE | Admit: 2019-02-07 | Discharge: 2019-02-07 | Disposition: A | Discharge status: Home / Self Care | Payer: Medicare Other | Source: Ambulatory Visit | Attending: Cardiology | Admitting: Cardiology

## 2019-02-07 DIAGNOSIS — Z955 Presence of coronary angioplasty implant and graft: Secondary | ICD-10-CM | POA: Diagnosis not present

## 2019-02-07 DIAGNOSIS — I2119 ST elevation (STEMI) myocardial infarction involving other coronary artery of inferior wall: Secondary | ICD-10-CM | POA: Diagnosis not present

## 2019-02-07 MED ORDER — ESZOPICLONE 3 MG PO TABS: 3.0000 mg | ORAL_TABLET | Freq: Every day | ORAL | 0 refills | Status: DC

## 2019-02-07 NOTE — Telephone Encounter (Signed)
3 mg lunesta phoned in to preferred pharmacy.to be taken at the sleep center for his sleep study.. No refills are ordered.

## 2019-02-07 NOTE — Addendum Note (Signed)
Addended by: Freada Bergeron on: 02/07/2019 02:28 PM   Modules accepted: Orders

## 2019-02-08 ENCOUNTER — Ambulatory Visit (HOSPITAL_COMMUNITY): Payer: Medicare Other

## 2019-02-09 ENCOUNTER — Ambulatory Visit (HOSPITAL_COMMUNITY): Payer: Medicare Other

## 2019-02-09 ENCOUNTER — Encounter (HOSPITAL_COMMUNITY)
Admission: RE | Admit: 2019-02-09 | Discharge: 2019-02-09 | Disposition: A | Discharge status: Home / Self Care | Payer: Medicare Other | Source: Ambulatory Visit | Attending: Cardiology | Admitting: Cardiology

## 2019-02-09 DIAGNOSIS — Z955 Presence of coronary angioplasty implant and graft: Secondary | ICD-10-CM | POA: Diagnosis not present

## 2019-02-09 DIAGNOSIS — I2119 ST elevation (STEMI) myocardial infarction involving other coronary artery of inferior wall: Secondary | ICD-10-CM | POA: Diagnosis not present

## 2019-02-10 ENCOUNTER — Telehealth: Payer: Self-pay | Admitting: Cardiology

## 2019-02-10 NOTE — Telephone Encounter (Signed)
Called and spoke to wife who states that Dr. Radford Pax called and spoke to patient and stated that he needed to have BIPAP titration scheduled next week. Wife states that they received an alert stating that the patient was scheduled for 04/04/19. She states that this is too long and would like to have it done sooner and did not feel like the patient could wait. Called and spoke to Brownlee who states that that was the soonest appointment they had and the patient will be placed on the cancellation list. She states that she thinks [facility] has sooner appointments but she did not know if the patient was willing to travel to [facility]. Spoke with wife and she states that they would be willing to go wherever is needed to have it done sooner. Made Nina aware. Wife made aware that Gae Bon said she will call her back today to coordinate. Wife appreciated the call.

## 2019-02-10 NOTE — Telephone Encounter (Signed)
New Message   PTs wife is calling because her husband has had 3 sleep studies and needs a 4th one. His next appt is scheduled in April and his wife says he needs an earlier appt His wife also said the sleep study needs to be scheduled   Please call

## 2019-02-10 NOTE — Telephone Encounter (Signed)
[name] this patient has very bad OSA and needs to be done in the next week.  You will have to move a patiet on the schedule

## 2019-02-11 ENCOUNTER — Encounter (HOSPITAL_COMMUNITY)
Admission: RE | Admit: 2019-02-11 | Discharge: 2019-02-11 | Disposition: A | Discharge status: Home / Self Care | Payer: Medicare Other | Source: Ambulatory Visit | Attending: Cardiology | Admitting: Cardiology

## 2019-02-11 ENCOUNTER — Ambulatory Visit (HOSPITAL_COMMUNITY): Payer: Medicare Other

## 2019-02-11 ENCOUNTER — Telehealth: Payer: Self-pay

## 2019-02-11 DIAGNOSIS — I2119 ST elevation (STEMI) myocardial infarction involving other coronary artery of inferior wall: Secondary | ICD-10-CM

## 2019-02-11 DIAGNOSIS — Z955 Presence of coronary angioplasty implant and graft: Secondary | ICD-10-CM

## 2019-02-11 NOTE — Telephone Encounter (Signed)
Letter received from [facility] via fax stating that they have supplied the pt with a temporary supply of Eszopiclone and that this drug is either not included on our list of covered drugs or its included on the formulary but subject to certain limits.  Since only 1 tablet was ordered this covers the pts needs at this time.

## 2019-02-14 ENCOUNTER — Ambulatory Visit (HOSPITAL_COMMUNITY): Payer: Medicare Other

## 2019-02-14 ENCOUNTER — Encounter (HOSPITAL_COMMUNITY)
Admission: RE | Admit: 2019-02-14 | Discharge: 2019-02-14 | Disposition: A | Discharge status: Home / Self Care | Payer: Medicare Other | Source: Ambulatory Visit | Attending: Cardiology | Admitting: Cardiology

## 2019-02-14 DIAGNOSIS — Z955 Presence of coronary angioplasty implant and graft: Secondary | ICD-10-CM | POA: Diagnosis not present

## 2019-02-14 DIAGNOSIS — I2119 ST elevation (STEMI) myocardial infarction involving other coronary artery of inferior wall: Secondary | ICD-10-CM

## 2019-02-15 NOTE — Telephone Encounter (Signed)
Patient has been rescheduled for Saturday February 22 at 8 pm.

## 2019-02-16 ENCOUNTER — Ambulatory Visit (HOSPITAL_COMMUNITY): Payer: Medicare Other

## 2019-02-16 ENCOUNTER — Encounter (HOSPITAL_COMMUNITY)
Admission: RE | Admit: 2019-02-16 | Discharge: 2019-02-16 | Disposition: A | Discharge status: Home / Self Care | Payer: Medicare Other | Source: Ambulatory Visit | Attending: Cardiology | Admitting: Cardiology

## 2019-02-16 DIAGNOSIS — Z955 Presence of coronary angioplasty implant and graft: Secondary | ICD-10-CM

## 2019-02-16 DIAGNOSIS — I2119 ST elevation (STEMI) myocardial infarction involving other coronary artery of inferior wall: Secondary | ICD-10-CM

## 2019-02-16 NOTE — Telephone Encounter (Signed)
  Lauralee Evener, CMA  Freada Bergeron, CMA        Patients insurance does not require PA. Ok to schedule.     ----- Message -----  From: Freada Bergeron, CMA  Sent: 02/03/2019  5:33 PM EST  To: [facility] Sleep Studies  Subject: precert                      BiPAP ASV titration ASAP

## 2019-02-16 NOTE — Telephone Encounter (Signed)
Patient is scheduled for BiPAP Titration on Saturday 02/19/19. Patient understands his titration study will be done at [facility] sleep lab. Patient understands he will receive a letter in a week or so detailing appointment, date, time, and location. Patient understands to call if he does not receive the letter  in a timely manner. Patient agrees with treatment and thanked me for call.

## 2019-02-18 ENCOUNTER — Ambulatory Visit (HOSPITAL_COMMUNITY): Payer: Medicare Other

## 2019-02-18 ENCOUNTER — Encounter (HOSPITAL_COMMUNITY)
Admission: RE | Admit: 2019-02-18 | Discharge: 2019-02-18 | Disposition: A | Discharge status: Home / Self Care | Payer: Medicare Other | Source: Ambulatory Visit | Attending: Cardiology | Admitting: Cardiology

## 2019-02-18 DIAGNOSIS — Z955 Presence of coronary angioplasty implant and graft: Secondary | ICD-10-CM

## 2019-02-18 DIAGNOSIS — I2119 ST elevation (STEMI) myocardial infarction involving other coronary artery of inferior wall: Secondary | ICD-10-CM | POA: Diagnosis not present

## 2019-02-19 ENCOUNTER — Ambulatory Visit (HOSPITAL_BASED_OUTPATIENT_CLINIC_OR_DEPARTMENT_OTHER): Payer: Medicare Other | Attending: Cardiology | Admitting: Cardiology

## 2019-02-19 ENCOUNTER — Encounter

## 2019-02-19 VITALS — Ht 72.0 in | Wt 282.0 lb

## 2019-02-19 DIAGNOSIS — G4719 Other hypersomnia: Secondary | ICD-10-CM

## 2019-02-19 DIAGNOSIS — G4731 Primary central sleep apnea: Secondary | ICD-10-CM | POA: Insufficient documentation

## 2019-02-19 DIAGNOSIS — G479 Sleep disorder, unspecified: Secondary | ICD-10-CM

## 2019-02-19 DIAGNOSIS — G4733 Obstructive sleep apnea (adult) (pediatric): Secondary | ICD-10-CM | POA: Diagnosis not present

## 2019-02-21 ENCOUNTER — Ambulatory Visit (HOSPITAL_COMMUNITY): Payer: Medicare Other

## 2019-02-21 ENCOUNTER — Encounter (HOSPITAL_COMMUNITY)
Admission: RE | Admit: 2019-02-21 | Discharge: 2019-02-21 | Disposition: A | Discharge status: Home / Self Care | Payer: Medicare Other | Source: Ambulatory Visit | Attending: Cardiology | Admitting: Cardiology

## 2019-02-21 ENCOUNTER — Other Ambulatory Visit: Payer: Medicare Other

## 2019-02-21 DIAGNOSIS — E785 Hyperlipidemia, unspecified: Secondary | ICD-10-CM

## 2019-02-21 DIAGNOSIS — I2119 ST elevation (STEMI) myocardial infarction involving other coronary artery of inferior wall: Secondary | ICD-10-CM | POA: Diagnosis not present

## 2019-02-21 DIAGNOSIS — Z955 Presence of coronary angioplasty implant and graft: Secondary | ICD-10-CM

## 2019-02-21 LAB — HEPATIC FUNCTION PANEL
ALT: 88 IU/L — ABNORMAL HIGH (ref 0–44)
AST: 38 IU/L (ref 0–40)
Albumin: 4.3 g/dL (ref 3.8–4.8)
Alkaline Phosphatase: 88 IU/L (ref 39–117)
Bilirubin Total: 0.5 mg/dL (ref 0.0–1.2)
Bilirubin, Direct: 0.23 mg/dL (ref 0.00–0.40)
Total Protein: 6.6 g/dL (ref 6.0–8.5)

## 2019-02-21 LAB — LIPID PANEL
Chol/HDL Ratio: 2.2 ratio (ref 0.0–5.0)
Cholesterol, Total: 80 mg/dL — ABNORMAL LOW (ref 100–199)
HDL: 36 mg/dL — ABNORMAL LOW (ref >39)
LDL Calculated: 34 mg/dL (ref 0–99)
Triglycerides: 49 mg/dL (ref 0–149)
VLDL Cholesterol Cal: 10 mg/dL (ref 5–40)

## 2019-02-21 NOTE — Telephone Encounter (Signed)
Cardiac Rehab - Pharmacy Resident Documentation   Patient unable to be reached after three call attempts. Please complete allergy verification and medication review during patient's cardiac rehab appointment.     

## 2019-02-21 NOTE — Procedures (Signed)
   Patient Name: [name], [name] Date: 02/19/2019 Gender: Male D.O.B: 12-Feb-1951 Age (years): 44 Referring Provider: Fransico Him MD, ABSM Height (inches): 72 Interpreting Physician: Fransico Him MD, ABSM Weight (lbs): 282 RPSGT: [place], [place] BMI: 38 MRN: [id number] Neck Size: 18.00  CLINICAL INFORMATION The patient is referred for a adaptive servo-ventilator titration study. Most recent polysomnogram dated 11/04/2018 revealed an AHI of 68.7/h and RDI of 69.0/h. Most recent titration study dated 01/27/2019 revealed an AHI of 40.4/h.  MEDICATIONS Medications self-administered by patient taken the night of the study : [place] As per the AASM Manual for the Scoring of Sleep and Associated Events v2.3 (April 2016) with a hypopnea requiring 4% desaturations.  The channels recorded and monitored were frontal, central and occipital EEG, electrooculogram (EOG), submentalis EMG (chin), nasal and oral airflow, thoracic and abdominal wall motion, anterior tibialis EMG, snore microphone, electrocardiogram, and pulse oximetry.  RESPIRATORY PARAMETERS Optimal Min IPAP (cm):4  Optimal Max IPAP (cm):15  Optimal Min EPAP (cm):4  Optimal Max EPAP (cm):15 Optimal Max Pressure (cm):15  Optimal Min PS (cm):3  Optimal Max PS (cm):15 Opitmal Breathing Rate (/min):Auto Overall Min O2 (%):84.00  Min O2 at Optimal Pressure (%):84.00  AHI at Optimal (/hr):N/A   SLEEP ARCHITECTURE During a recording time of 359.6 minutes, the patient slept for 309.5 minutes. Sleep efficiency was 86.1%. The patient spent 5.33% of the night in stage N1 sleep, 76.58% in stage N2 sleep, 0.00% in stage N3 and 18.1% in REM. Wake after sleep onset (WASO) was 48.3 minutes. Alpha intrusion was ABSENT. Supine sleep was 74.49%. The arousal index was 24.0.  LEG MOVEMENT DATA PLM Index (/hr):  PLM Arousal Index (/hr):0.8  CARDIAC DATA The 2 lead EKG demonstrated sinus rhythm. The mean heart rate  was 47.26 beats per minute. Other EKG findings include: None.  IMPRESSIONS - Mild obstructive sleep apnea occurred during this study (AHI = 12.8/hour). An optimal ASV pressure was selected. - No significant central sleep apnea occurred during this study (CAI = 0.6). - Moderate oxygen desaturation was noted during this study (Min O2 = 84.00). - No snoring was audible during this study. - No cardiac abnormalities were noted during this study. - Mild periodic limb movements of sleep occurred during the study.  DIAGNOSIS - Obstructive Sleep Apnea (327.23 [G47.33 ICD-10])  RECOMMENDATIONS - Trial of SV Advance EPAP Min 4 and Max 15 cmH2O, Pressure Support Min 3 and Max 15 cmH2O with Max Pressure of 25 cmH2O and Breath Rate of Auto BrPM with medium Respironics [employer] full face mask and heated humidity - Avoid alcohol, sedatives and other CNS depressants that may worsen sleep apnea and disrupt normal sleep architecture. - Sleep hygiene should be reviewed to assess factors that may improve sleep quality. - Weight management and regular exercise should be initiated or continued. - Return to Sleep Center for re-evaluation after 10 weeks of therapy  [Electronically signed] 02/21/2019 08:18 PM  Fransico Him MD, ABSM Diplomate, American Board of Sleep Medicine

## 2019-02-23 ENCOUNTER — Telehealth: Payer: Self-pay | Admitting: *Deleted

## 2019-02-23 ENCOUNTER — Ambulatory Visit (HOSPITAL_COMMUNITY): Payer: Medicare Other

## 2019-02-23 ENCOUNTER — Encounter (HOSPITAL_COMMUNITY)
Admission: RE | Admit: 2019-02-23 | Discharge: 2019-02-23 | Disposition: A | Discharge status: Home / Self Care | Payer: Medicare Other | Source: Ambulatory Visit | Attending: Cardiology | Admitting: Cardiology

## 2019-02-23 ENCOUNTER — Telehealth: Payer: Self-pay

## 2019-02-23 DIAGNOSIS — I2119 ST elevation (STEMI) myocardial infarction involving other coronary artery of inferior wall: Secondary | ICD-10-CM

## 2019-02-23 DIAGNOSIS — Z955 Presence of coronary angioplasty implant and graft: Secondary | ICD-10-CM

## 2019-02-23 NOTE — Telephone Encounter (Signed)
Notes recorded by Frederik Schmidt, RN on 02/23/2019 at 10:15 AM EST The patient has been notified of the result and verbalized understanding. All questions (if any) were answered. Frederik Schmidt, RN 02/23/2019 10:15 AM

## 2019-02-23 NOTE — Telephone Encounter (Signed)
-----   Message from Consuelo Pandy, [place] sent at 02/23/2019  9:02 AM EST ----- Cholesterol improved after starting Lipitor 80 mg. LDL (bad cholesterol) is down from 111 to 34 which is great. We recommend a LDL level < 70 to reduce risk of having another heart attack. One of his liver enzymes however is slightly elevated, but based on level, statin should be continued. We can repeat HFTs when he returns in April to see Dr. Radford Pax to make sure there has been no further increase in level. Check with pt to see if he has had any side effects with the medication.

## 2019-02-23 NOTE — Telephone Encounter (Signed)
Informed patient of titration results and verbalized understanding was indicated. Patient understands his titration study showed  they had a successful PAP titration and orders are in EPIC. Please set up 10 week OV with me.   Upon patient request DME selection is [facility]. Patient understands hr will be contacted by [facility] to set up his cpap. Patient understands to call if [facility] does not contact him with new setup in a timely manner. Patient understands they will be called once confirmation has been received from [facility] that they have received their new machine to schedule 10 week follow up appointment.  [facility] notified of new cpap order  Please add to airview Patient was grateful for the call and thanked me.

## 2019-02-23 NOTE — Telephone Encounter (Signed)
-----   Message from Sueanne Margarita, MD sent at 02/21/2019  8:22 PM EST ----- Please let patient know that they had a successful PAP titration and let DME know that orders are in EPIC.  Please set up 10 week OV with me.

## 2019-02-25 ENCOUNTER — Ambulatory Visit (HOSPITAL_COMMUNITY): Payer: Medicare Other

## 2019-02-25 ENCOUNTER — Encounter (HOSPITAL_COMMUNITY)
Admission: RE | Admit: 2019-02-25 | Discharge: 2019-02-25 | Disposition: A | Discharge status: Home / Self Care | Payer: Medicare Other | Source: Ambulatory Visit | Attending: Cardiology | Admitting: Cardiology

## 2019-02-25 DIAGNOSIS — I2119 ST elevation (STEMI) myocardial infarction involving other coronary artery of inferior wall: Secondary | ICD-10-CM | POA: Diagnosis not present

## 2019-02-25 DIAGNOSIS — Z955 Presence of coronary angioplasty implant and graft: Secondary | ICD-10-CM | POA: Diagnosis not present

## 2019-02-28 ENCOUNTER — Ambulatory Visit (HOSPITAL_COMMUNITY): Payer: Medicare Other

## 2019-02-28 ENCOUNTER — Encounter (HOSPITAL_COMMUNITY)
Admission: RE | Admit: 2019-02-28 | Discharge: 2019-02-28 | Disposition: A | Discharge status: Home / Self Care | Payer: Medicare Other | Source: Ambulatory Visit | Attending: Cardiology | Admitting: Cardiology

## 2019-02-28 DIAGNOSIS — I2119 ST elevation (STEMI) myocardial infarction involving other coronary artery of inferior wall: Secondary | ICD-10-CM | POA: Diagnosis not present

## 2019-02-28 DIAGNOSIS — Z955 Presence of coronary angioplasty implant and graft: Secondary | ICD-10-CM | POA: Insufficient documentation

## 2019-03-02 ENCOUNTER — Ambulatory Visit (HOSPITAL_COMMUNITY): Payer: Medicare Other

## 2019-03-02 ENCOUNTER — Encounter (HOSPITAL_COMMUNITY)
Admission: RE | Admit: 2019-03-02 | Discharge: 2019-03-02 | Disposition: A | Discharge status: Home / Self Care | Payer: Medicare Other | Source: Ambulatory Visit | Attending: Cardiology | Admitting: Cardiology

## 2019-03-02 DIAGNOSIS — I2119 ST elevation (STEMI) myocardial infarction involving other coronary artery of inferior wall: Secondary | ICD-10-CM | POA: Diagnosis not present

## 2019-03-02 DIAGNOSIS — Z955 Presence of coronary angioplasty implant and graft: Secondary | ICD-10-CM | POA: Diagnosis not present

## 2019-03-02 NOTE — Progress Notes (Signed)
Cardiac Individual Treatment Plan  Patient Details  Name: [name] MRN: [id number] Date of Birth: November 28, 1951 Referring Provider:   Flowsheet Row CARDIAC REHAB PHASE II ORIENTATION from 01/20/2019 in [place]  Referring Provider  Dr. Radford Pax      Initial Encounter Date:  Flowsheet Row CARDIAC REHAB PHASE II ORIENTATION from 01/20/2019 in [place]  Date  01/20/19      Visit Diagnosis: Acute ST elevation myocardial infarction (STEMI) of inferior wall ([place]), 12/30/18  Status post coronary artery stent placement,DES RPDA 12/30/18  Patient's Home Medications on Admission:  Current Outpatient Medications:  .  acetaminophen (TYLENOL) 325 MG tablet, Take 2 tablets (650 mg total) by mouth every 4 (four) hours as needed for headache or mild pain., Disp: , Rfl:  .  aspirin 81 MG chewable tablet, Chew 1 tablet (81 mg total) by mouth daily., Disp: , Rfl:  .  atorvastatin (LIPITOR) 80 MG tablet, Take 1 tablet (80 mg total) by mouth daily at 6 PM., Disp: 90 tablet, Rfl: 3 .  baclofen (LIORESAL) 10 MG tablet, Take 1 tablet (10 mg total) by mouth every 8 (eight) hours as needed for muscle spasms., Disp: 30 each, Rfl: 0 .  Eszopiclone (ESZOPICLONE) 3 MG TABS, Take 1 tablet (3 mg total) by mouth at bedtime. Take immediately before bedtime, Disp: 1 tablet, Rfl: 0 .  levothyroxine (SYNTHROID, LEVOTHROID) 150 MCG tablet, Take 1 tablet (150 mcg total) by mouth daily. -- Office visit needed for further refills, Disp: 90 tablet, Rfl: 0 .  losartan (COZAAR) 50 MG tablet, Take 1 tablet (50 mg total) by mouth daily., Disp: 90 tablet, Rfl: 3 .  metoprolol succinate (TOPROL-XL) 25 MG 24 hr tablet, Take 1 tablet (25 mg total) by mouth daily., Disp: 90 tablet, Rfl: 3 .  Multiple Vitamins-Minerals (MULTIVITAMIN) tablet, Take 1 tablet by mouth daily., Disp: , Rfl:  .  nitroGLYCERIN (NITROSTAT) 0.4 MG SL tablet, Place 1 tablet (0.4 mg total) under the  tongue every 5 (five) minutes as needed for chest pain., Disp: 25 tablet, Rfl: 5 .  ticagrelor (BRILINTA) 90 MG TABS tablet, Take 1 tablet (90 mg total) by mouth 2 (two) times daily., Disp: 180 tablet, Rfl: 3  Past Medical History: Past Medical History:  Diagnosis Date  . Arthritis    Knees  . BPH (benign prostatic hypertrophy)   . COLONIC POLYPS, HX OF 2007   clear colo w/o polyps 04/2015: 72yrfollow up  . Coronary artery disease   . HYPERTENSION   . HYPOTHYROIDISM   . OBESITY   . PAF (paroxysmal atrial fibrillation) ([facility] 10/01/2018    Tobacco Use: Social History   Tobacco Use  Smoking Status Never Smoker  Smokeless Tobacco Never Used    Labs: Recent Review Flowsheet Data    Labs for ITP Cardiac and Pulmonary Rehab Latest Ref Rng & Units 12/30/2018 12/30/2018 12/30/2018 12/31/2018 02/21/2019   Cholestrol 100 - 199 mg/dL 198 - 167 - 80(L)   LDLCALC 0 - 99 mg/dL 93 - 111(H) - 34   LDLDIRECT mg/dL - - - - -   HDL >39 mg/dL 41 - 39(L) - 36(L)   Trlycerides 0 - 149 mg/dL 322(H) - 83 - 49   Hemoglobin A1c 4.8 - 5.6 % - - - 5.4 -   TCO2 22 - 32 mmol/L - 24 - - -      Capillary Blood Glucose: No results found for: GLUCAP   Exercise Target  Goals: Exercise Program Goal: Individual exercise prescription set using results from initial 6 min walk test and THRR while considering  patient's activity barriers and safety.   Exercise Prescription Goal: Initial exercise prescription builds to 30-45 minutes a day of aerobic activity, 2-3 days per week.  Home exercise guidelines will be given to patient during program as part of exercise prescription that the participant will acknowledge.  Activity Barriers & Risk Stratification: Activity Barriers & Cardiac Risk Stratification - 01/20/19 0942    Activity Barriers & Cardiac Risk Stratification          Activity Barriers  Left Knee Replacement;Right Hip Replacement;Right Knee Replacement;Left Hip Replacement;Back Problems    Cardiac Risk  Stratification  High           6 Minute Walk: 6 Minute Walk    6 Minute Walk    Row Name 01/20/19 0941   Phase  Initial   Distance  1453 feet   Walk Time  6 minutes   # of Rest Breaks  0   MPH  2.7   METS  2.9   RPE  11   VO2 Peak  10.41   Symptoms  No   Resting HR  69 bpm   Resting BP  122/70   Resting Oxygen Saturation   97 %   Exercise Oxygen Saturation  during 6 min walk  97 %   Max Ex. HR  107 bpm   Max Ex. BP  140/78   2 Minute Post BP  114/82          Oxygen Initial Assessment:   Oxygen Re-Evaluation:   Oxygen Discharge (Final Oxygen Re-Evaluation):   Initial Exercise Prescription: Initial Exercise Prescription - 01/20/19 0900    Date of Initial Exercise RX and Referring Provider          Date  01/20/19    Referring Provider  Dr. Radford Pax    Expected Discharge Date  04/25/19        Treadmill          MPH  2.5    Grade  1    Minutes  10        Bike          Level  1    Minutes  10    METs  2.51        NuStep          Level  3    SPM  85    Minutes  10    METs  2.5        Prescription Details          Frequency (times per week)  3    Duration  Progress to 30 minutes of continuous aerobic without signs/symptoms of physical distress        Intensity          THRR 40-80% of Max Heartrate  62-124    Ratings of Perceived Exertion  11-13        Progression          Progression  Continue to progress workloads to maintain intensity without signs/symptoms of physical distress.        Resistance Training          Training Prescription  Yes    Weight  4 lbs.    Reps  10-15           Perform Capillary Blood Glucose checks as needed.  Exercise Prescription Changes: Exercise Prescription  Changes    Response to Exercise    Row Name 01/24/19 0900 01/31/19 1000 02/04/19 0900 02/22/19 1300   Blood Pressure (Admit)  128/70  120/82  124/72  122/72   Blood Pressure (Exercise)  140/70  130/68  144/80  132/78   Blood Pressure  (Exit)  132/80  120/72  140/64  118/72   Heart Rate (Admit)  85 bpm  68 bpm  70 bpm  62 bpm   Heart Rate (Exercise)  107 bpm  122 bpm  105 bpm  108 bpm   Heart Rate (Exit)  84 bpm  75 bpm  74 bpm  71 bpm   Rating of Perceived Exertion (Exercise)  '11  14  11  14   ' Perceived Dyspnea (Exercise)  0  0  0  0   Symptoms  None  None  None  None   Comments  Pt's first day of exercise   no documentation  no documentation  no documentation   Duration  Progress to 30 minutes of  aerobic without signs/symptoms of physical distress  Progress to 30 minutes of  aerobic without signs/symptoms of physical distress  Progress to 30 minutes of  aerobic without signs/symptoms of physical distress  Progress to 30 minutes of  aerobic without signs/symptoms of physical distress   Intensity  THRR unchanged  THRR unchanged  THRR unchanged  THRR unchanged       Progression    Row Name 01/24/19 0900 01/31/19 1000 02/04/19 0900 02/22/19 1300   Progression  Continue to progress workloads to maintain intensity without signs/symptoms of physical distress.  Continue to progress workloads to maintain intensity without signs/symptoms of physical distress.  Continue to progress workloads to maintain intensity without signs/symptoms of physical distress.  Continue to progress workloads to maintain intensity without signs/symptoms of physical distress.   Average METs  2.83  4.8  3.6  5.5       Resistance Training    Row Name 01/24/19 0900 01/31/19 1000 02/04/19 0900 02/22/19 1300   Training Prescription  Yes  Yes  Yes  Yes   Weight  4lbs  4lbs  6 lbs.   6 lbs.    Reps  10-15  10-15  10-15  10-15   Time  10 Minutes  10 Minutes  10 Minutes  [place] Name 01/24/19 0900 01/31/19 1000 02/04/19 0900 02/22/19 1300   MPH  2.5  2.6  no documentation  no documentation   Grade  10  2  no documentation  no documentation   Minutes  10  10  no documentation  no documentation   METs  3.26  3.71  no documentation   no documentation       [place] Name 01/24/19 0900 01/31/19 1000 02/04/19 0900 02/22/19 1300   Level  '3  3  4  6   ' SPM  85  85  85  85   Minutes  '20  10  10  10   ' METs  2.4  4  3.2  4.4       Arm Ergometer    Row Name 01/24/19 0900 01/31/19 1000 02/04/19 0900 02/22/19 1300   Level  no documentation  no documentation  5  5   Watts  no documentation  no documentation  25  25   Minutes  no documentation  no documentation  10  10   METs  no documentation  no documentation  4  6.6       Elliptical    Row Name 01/24/19 0900 01/31/19 1000 02/04/19 0900 02/22/19 1300   Level  no documentation  1  no documentation  no documentation   Speed  no documentation  1  no documentation  no documentation   Minutes  no documentation  10  no documentation  no documentation   METs  no documentation  6.7  no documentation  no documentation       [place] Name 01/24/19 0900 01/31/19 1000 02/04/19 0900 02/22/19 1300   Plans to continue exercise at  no documentation  no documentation  Home (comment)  Home (comment)   Frequency  no documentation  no documentation  Add 3 additional days to program exercise sessions.  Add 3 additional days to program exercise sessions.   Initial Home Exercises Provided  no documentation  no documentation  02/04/19  02/04/19          Exercise Comments: Exercise Comments    Row Name 01/24/19 0940 02/02/19 0959 02/04/19 1001 03/01/19 1419   Exercise Comments  Pt's first day of exercise. Pt oriented to exercise equipment. Pt reported knee pain with [employer] bike. Will remove from exercise prescription. Will continue to monitor pt and progress as tolerated.   Reviewed METs and goals with Pt. Pt is tolerating exercise Rx well.   Reviewed HEP with Pt. Pt was responsive and understands goals.   Reviewed METs and goals with Pt. Pt is tolerating exercise well.       Exercise Goals and Review: Exercise Goals    Exercise Goals    Row Name 01/20/19 0942    Increase Physical Activity  Yes   Intervention  Provide advice, education, support and counseling about physical activity/exercise needs.;Develop an individualized exercise prescription for aerobic and resistive training based on initial evaluation findings, risk stratification, comorbidities and participant's personal goals.   Expected Outcomes  Short Term: Attend rehab on a regular basis to increase amount of physical activity.   Increase Strength and Stamina  Yes   Intervention  Provide advice, education, support and counseling about physical activity/exercise needs.;Develop an individualized exercise prescription for aerobic and resistive training based on initial evaluation findings, risk stratification, comorbidities and participant's personal goals.   Expected Outcomes  Short Term: Increase workloads from initial exercise prescription for resistance, speed, and METs.   Able to understand and use rate of perceived exertion (RPE) scale  Yes   Intervention  Provide education and explanation on how to use RPE scale   Expected Outcomes  Short Term: Able to use RPE daily in rehab to express subjective intensity level;Long Term:  Able to use RPE to guide intensity level when exercising independently   Knowledge and understanding of Target Heart Rate Range (THRR)  Yes   Intervention  Provide education and explanation of THRR including how the numbers were predicted and where they are located for reference   Expected Outcomes  Short Term: Able to state/look up THRR;Long Term: Able to use THRR to govern intensity when exercising independently;Short Term: Able to use daily as guideline for intensity in rehab   Able to check pulse independently  Yes   Intervention  Provide education and demonstration on how to check pulse in carotid and radial arteries.;Review the importance of being able to check your own pulse for safety during independent exercise   Expected Outcomes  Short Term: Able to explain  why  pulse checking is important during independent exercise;Long Term: Able to check pulse independently and accurately   Understanding of Exercise Prescription  Yes   Intervention  Provide education, explanation, and written materials on patient's individual exercise prescription   Expected Outcomes  Short Term: Able to explain program exercise prescription;Long Term: Able to explain home exercise prescription to exercise independently          Exercise Goals Re-Evaluation : Exercise Goals Re-Evaluation    Exercise Goal Re-Evaluation    Row Name 02/02/19 0956 02/04/19 0954 03/01/19 1417   Exercise Goals Review  Increase Physical Activity;Increase Strength and Stamina;Able to understand and use rate of perceived exertion (RPE) scale;Knowledge and understanding of Target Heart Rate Range (THRR);Understanding of Exercise Prescription  Increase Physical Activity;Increase Strength and Stamina;Able to understand and use rate of perceived exertion (RPE) scale;Knowledge and understanding of Target Heart Rate Range (THRR);Understanding of Exercise Prescription  Increase Physical Activity;Increase Strength and Stamina;Able to understand and use rate of perceived exertion (RPE) scale;Knowledge and understanding of Target Heart Rate Range (THRR);Understanding of Exercise Prescription   Comments  Reviewed METs and goals with Pt. Pt is new to the program and has a MET level of 4.8. Tolerates exercise Rx well and improves with each session. Pt is going to a local gym 2-4 days per week and exercising 30-50 minutes in addition to the program.   Reviewed HEP with Pt. Pt understands home exercise goals, NTG use, exercise precautions, weather precautions, and enpoints of exericse. Pt is exercising at home and a loacal gym 2-4 days per week for 30-45 minutes in addition to Cardiac Rehab.   Reviewed METs and goals with Pt. Pt MET level is 5.5. Pt is tolerating exercise well and increasing workloads gradually. Pt is exercising  at a local gym in the pool 5-7 days a week for 30-45 minutes in addition to Cardiac Rehab.    Expected Outcomes  Will continue to monitor and progress Pt as tolerated.   Will continue to monitor and progress Pt as tolerated.   Will continue to monitor and progress Pt as tolerated.           Discharge Exercise Prescription (Final Exercise Prescription Changes): Exercise Prescription Changes - 02/22/19 1300    Response to Exercise          Blood Pressure (Admit)  122/72    Blood Pressure (Exercise)  132/78    Blood Pressure (Exit)  118/72    Heart Rate (Admit)  62 bpm    Heart Rate (Exercise)  108 bpm    Heart Rate (Exit)  71 bpm    Rating of Perceived Exertion (Exercise)  14    Perceived Dyspnea (Exercise)  0    Symptoms  None    Duration  Progress to 30 minutes of  aerobic without signs/symptoms of physical distress    Intensity  THRR unchanged        Progression          Progression  Continue to progress workloads to maintain intensity without signs/symptoms of physical distress.    Average METs  5.5        Resistance Training          Training Prescription  Yes    Weight  6 lbs.     Reps  10-15    Time  10 Minutes        NuStep          Level  6    SPM  85    Minutes  10    METs  4.4        Arm Ergometer          Level  5    Watts  25    Minutes  10    METs  6.6        Home Exercise Plan          Plans to continue exercise at  Home (comment)    Frequency  Add 3 additional days to program exercise sessions.    Initial Home Exercises Provided  02/04/19           Nutrition:  Target Goals: Understanding of nutrition guidelines, daily intake of sodium <1535m, cholesterol <2030m calories 30% from fat and 7% or less from saturated fats, daily to have 5 or more servings of fruits and vegetables.  Biometrics: Pre Biometrics - 01/20/19 0925    Pre Biometrics          Waist Circumference  51.5 inches    Hip Circumference  47 inches    Waist to Hip  Ratio  1.1 %    Triceps Skinfold  31 mm    % Body Fat  39.1 %    Grip Strength  33 kg    Flexibility  0 in    Single Leg Stand  3.75 seconds            Nutrition Therapy Plan and Nutrition Goals: Nutrition Therapy & Goals - 01/31/19 0915    Nutrition Therapy          Diet  heart healthy, carb modified        Personal Nutrition Goals          Nutrition Goal  Pt to identify and limit food sources of saturated fat, trans fat, refined carbohydrates and sodium    Personal Goal #2  Pt to identify food quantities necessary to achieve weight loss of 6-24 lb at graduation from cardiac rehab.     Personal Goal #3  Pt to build a healthy plate including vegetables, fruits, whole grains, lean protein and low-fat dairy products in a heart healthy meal plan.    Personal Goal #4  Pt to weigh and measure serving sizes        Intervention Plan          Intervention  Prescribe, educate and counsel regarding individualized specific dietary modifications aiming towards targeted core components such as weight, hypertension, lipid management, diabetes, heart failure and other comorbidities.    Expected Outcomes  Short Term Goal: Understand basic principles of dietary content, such as calories, fat, sodium, cholesterol and nutrients.;Long Term Goal: Adherence to prescribed nutrition plan.           Nutrition Assessments: Nutrition Assessments - 01/20/19 1153    MEDFICTS Scores          Pre Score  21           Nutrition Goals Re-Evaluation: Nutrition Goals Re-Evaluation    Goals    Row Name 01/20/19 1154 01/31/19 0915   Current Weight  278 lb 7.1 oz (126.3 kg)  278 lb 7.1 oz (126.3 kg)          Nutrition Goals Re-Evaluation: Nutrition Goals Re-Evaluation    Goals    Row Name 01/20/19 1154 01/31/19 0915   Current Weight  278 lb 7.1 oz (126.3 kg)  278 lb 7.1 oz (126.3 kg)          Nutrition  Goals Discharge (Final Nutrition Goals Re-Evaluation): Nutrition Goals Re-Evaluation -  01/31/19 0915    Goals          Current Weight  278 lb 7.1 oz (126.3 kg)           Psychosocial: Target Goals: Acknowledge presence or absence of significant depression and/or stress, maximize coping skills, provide positive support system. Participant is able to verbalize types and ability to use techniques and skills needed for reducing stress and depression.  Initial Review & Psychosocial Screening: Initial Psych Review & Screening - 01/20/19 1039    Initial Review          Current issues with  None Identified        Family Dynamics          Good Support System?  Yes   [name] has his wife for support       Barriers          Psychosocial barriers to participate in program  There are no identifiable barriers or psychosocial needs.        Screening Interventions          Interventions  Encouraged to exercise           Quality of Life Scores: Quality of Life - 01/20/19 0944    Quality of Life          Select  Quality of Life        Quality of Life Scores          Health/Function Pre  24.03 %    Socioeconomic Pre  27.5 %    Psych/Spiritual Pre  22.29 %    Family Pre  28.8 %    GLOBAL Pre  25.09 %          Scores of 19 and below usually indicate a poorer quality of life in these areas.  A difference of  2-3 points is a clinically meaningful difference.  A difference of 2-3 points in the total score of the Quality of Life Index has been associated with significant improvement in overall quality of life, self-image, physical symptoms, and general health in studies assessing change in quality of life.  PHQ-9: Recent Review Flowsheet Data    Depression screen [facility] 2/9 01/24/2019 08/19/2018 05/12/2018 03/30/2014   Decreased Interest 0 0 0 0   Down, Depressed, Hopeless 0 0 0 0   PHQ - 2 Score 0 0 0 0   Altered sleeping - - 2 -   Tired, decreased energy - - 1 -   Change in appetite - - 0 -   Feeling bad or failure about yourself  - - 0 -   Trouble concentrating - -  0 -   Moving slowly or fidgety/restless - - 0 -   Suicidal thoughts - - 0 -   PHQ-9 Score - - 3 -   Difficult doing work/chores - - Not difficult at all -     Interpretation of Total Score  Total Score Depression Severity:  1-4 = Minimal depression, 5-9 = Mild depression, 10-14 = Moderate depression, 15-19 = Moderately severe depression, 20-27 = Severe depression   Psychosocial Evaluation and Intervention: Psychosocial Evaluation - 01/24/19 1536    Psychosocial Evaluation & Interventions          Interventions  Encouraged to exercise with the program and follow exercise prescription    Comments  no psychosocial needs identified, no interventions necessary.  pt enjoys spending time with grandchildren and  garden.      Expected Outcomes  pt will exhibit positive outlook with good coping skills.     Continue Psychosocial Services   No Follow up required           Psychosocial Re-Evaluation: Psychosocial Re-Evaluation    Psychosocial Re-Evaluation    [place] Name 02/02/19 1121 03/01/19 1337   Current issues with  None Identified  None Identified   Comments  no psychosocial needs identified, no interventions necessary   no psychosocial needs identified, no interventions necessary    Expected Outcomes  pt will exhibit positive outlook with good coping skills.   pt will exhibit positive outlook with good coping skills.    Interventions  Encouraged to attend Cardiac Rehabilitation for the exercise  Encouraged to attend Cardiac Rehabilitation for the exercise   Continue Psychosocial Services   No Follow up required  No Follow up required          Psychosocial Discharge (Final Psychosocial Re-Evaluation): Psychosocial Re-Evaluation - 03/01/19 1337    Psychosocial Re-Evaluation          Current issues with  None Identified    Comments  no psychosocial needs identified, no interventions necessary     Expected Outcomes  pt will exhibit positive outlook with good coping skills.      Interventions  Encouraged to attend Cardiac Rehabilitation for the exercise    Continue Psychosocial Services   No Follow up required           Vocational Rehabilitation: Provide vocational rehab assistance to qualifying candidates.   Vocational Rehab Evaluation & Intervention: Vocational Rehab - 01/20/19 1042    Initial Vocational Rehab Evaluation & Intervention          Assessment shows need for Vocational Rehabilitation  No   [name] is retired and does not need vocational rehab at this time          Education: Education Goals: Education classes will be provided on a weekly basis, covering required topics. Participant will state understanding/return demonstration of topics presented.  Learning Barriers/Preferences: Learning Barriers/Preferences - 01/20/19 0944    Learning Barriers/Preferences          Learning Barriers  Sight    Learning Preferences  None           Education Topics: Count Your Pulse:  -Group instruction provided by verbal instruction, demonstration, patient participation and written materials to support subject.  Instructors address importance of being able to find your pulse and how to count your pulse when at home without a heart monitor.  Patients get hands on experience counting their pulse with staff help and individually.   Heart Attack, Angina, and Risk Factor Modification:  -Group instruction provided by verbal instruction, video, and written materials to support subject.  Instructors address signs and symptoms of angina and heart attacks.    Also discuss risk factors for heart disease and how to make changes to improve heart health risk factors.   Functional Fitness:  -Group instruction provided by verbal instruction, demonstration, patient participation, and written materials to support subject.  Instructors address safety measures for doing things around the house.  Discuss how to get up and down off the floor, how to pick things up properly,  how to safely get out of a chair without assistance, and balance training. Flowsheet Row CARDIAC REHAB PHASE II EXERCISE from 03/02/2019 in [place]  Date  02/18/19  Educator  EP  Instruction Review Code  2-  Demonstrated Understanding      Meditation and Mindfulness:  -Group instruction provided by verbal instruction, patient participation, and written materials to support subject.  Instructor addresses importance of mindfulness and meditation practice to help reduce stress and improve awareness.  Instructor also leads participants through a meditation exercise.  Flowsheet Row CARDIAC REHAB PHASE II EXERCISE from 03/02/2019 in [place]  Date  03/02/19  Instruction Review Code  2- Demonstrated Understanding      Stretching for Flexibility and Mobility:  -Group instruction provided by verbal instruction, patient participation, and written materials to support subject.  Instructors lead participants through series of stretches that are designed to increase flexibility thus improving mobility.  These stretches are additional exercise for major muscle groups that are typically performed during regular warm up and cool down.   Hands Only CPR:  -Group verbal, video, and participation provides a basic overview of AHA guidelines for community CPR. Role-play of emergencies allow participants the opportunity to practice calling for help and chest compression technique with discussion of AED use.   Hypertension: -Group verbal and written instruction that provides a basic overview of hypertension including the most recent diagnostic guidelines, risk factor reduction with self-care instructions and medication management.    Nutrition I class: Heart Healthy Eating:  -Group instruction provided by PowerPoint slides, verbal discussion, and written materials to support subject matter. The instructor gives an explanation and review of the  Therapeutic Lifestyle Changes diet recommendations, which includes a discussion on lipid goals, dietary fat, sodium, fiber, plant stanol/sterol esters, sugar, and the components of a well-balanced, healthy diet.   Nutrition II class: Lifestyle Skills:  -Group instruction provided by PowerPoint slides, verbal discussion, and written materials to support subject matter. The instructor gives an explanation and review of label reading, grocery shopping for heart health, heart healthy recipe modifications, and ways to make healthier choices when eating out.   Diabetes Question & Answer:  -Group instruction provided by PowerPoint slides, verbal discussion, and written materials to support subject matter. The instructor gives an explanation and review of diabetes co-morbidities, pre- and post-prandial blood glucose goals, pre-exercise blood glucose goals, signs, symptoms, and treatment of hypoglycemia and hyperglycemia, and foot care basics.   Diabetes Blitz:  -Group instruction provided by PowerPoint slides, verbal discussion, and written materials to support subject matter. The instructor gives an explanation and review of the physiology behind type 1 and type 2 diabetes, diabetes medications and rational behind using different medications, pre- and post-prandial blood glucose recommendations and Hemoglobin A1c goals, diabetes diet, and exercise including blood glucose guidelines for exercising safely.    Portion Distortion:  -Group instruction provided by PowerPoint slides, verbal discussion, written materials, and food models to support subject matter. The instructor gives an explanation of serving size versus portion size, changes in portions sizes over the last 20 years, and what consists of a serving from each food group.   Stress Management:  -Group instruction provided by verbal instruction, video, and written materials to support subject matter.  Instructors review role of stress in heart  disease and how to cope with stress positively.     Exercising on Your Own:  -Group instruction provided by verbal instruction, power point, and written materials to support subject.  Instructors discuss benefits of exercise, components of exercise, frequency and intensity of exercise, and end points for exercise.  Also discuss use of nitroglycerin and activating EMS.  Review options of places to exercise outside of rehab.  Review  guidelines for sex with heart disease.   Cardiac Drugs I:  -Group instruction provided by verbal instruction and written materials to support subject.  Instructor reviews cardiac drug classes: antiplatelets, anticoagulants, beta blockers, and statins.  Instructor discusses reasons, side effects, and lifestyle considerations for each drug class.   Cardiac Drugs II:  -Group instruction provided by verbal instruction and written materials to support subject.  Instructor reviews cardiac drug classes: angiotensin converting enzyme inhibitors (ACE-I), angiotensin II receptor blockers (ARBs), nitrates, and calcium channel blockers.  Instructor discusses reasons, side effects, and lifestyle considerations for each drug class.   Anatomy and Physiology of the Circulatory System:  Group verbal and written instruction and models provide basic cardiac anatomy and physiology, with the coronary electrical and arterial systems. Review of: AMI, Angina, Valve disease, Heart Failure, Peripheral Artery Disease, Cardiac Arrhythmia, Pacemakers, and the ICD.   Other Education:  -Group or individual verbal, written, or video instructions that support the educational goals of the cardiac rehab program.   Holiday Eating Survival Tips:  -Group instruction provided by PowerPoint slides, verbal discussion, and written materials to support subject matter. The instructor gives patients tips, tricks, and techniques to help them not only survive but enjoy the holidays despite the onslaught of food  that accompanies the holidays.   Knowledge Questionnaire Score: Knowledge Questionnaire Score - 01/20/19 0945    Knowledge Questionnaire Score          Pre Score  23/24           Core Components/Risk Factors/Patient Goals at Admission: Personal Goals and Risk Factors at Admission - 01/20/19 0945    Core Components/Risk Factors/Patient Goals on Admission           Weight Management  Yes;Obesity;Weight Maintenance;Weight Loss    Intervention  Weight Management: Develop a combined nutrition and exercise program designed to reach desired caloric intake, while maintaining appropriate intake of nutrient and fiber, sodium and fats, and appropriate energy expenditure required for the weight goal.;Weight Management: Provide education and appropriate resources to help participant work on and attain dietary goals.;Weight Management/Obesity: Establish reasonable short term and long term weight goals.;Obesity: Provide education and appropriate resources to help participant work on and attain dietary goals.    Admit Weight  278 lb 7.1 oz (126.3 kg)    Expected Outcomes  Short Term: Continue to assess and modify interventions until short term weight is achieved;Long Term: Adherence to nutrition and physical activity/exercise program aimed toward attainment of established weight goal;Weight Maintenance: Understanding of the daily nutrition guidelines, which includes 25-35% calories from fat, 7% or less cal from saturated fats, less than 27m cholesterol, less than 1.5gm of sodium, & 5 or more servings of fruits and vegetables daily;Understanding recommendations for meals to include 15-35% energy as protein, 25-35% energy from fat, 35-60% energy from carbohydrates, less than 2023mof dietary cholesterol, 20-35 gm of total fiber daily;Understanding of distribution of calorie intake throughout the day with the consumption of 4-5 meals/snacks;Weight Loss: Understanding of general recommendations for a balanced  deficit meal plan, which promotes 1-2 lb weight loss per week and includes a negative energy balance of [phone] kcal/d    Hypertension  Yes    Intervention  Monitor prescription use compliance.;Provide education on lifestyle modifcations including regular physical activity/exercise, weight management, moderate sodium restriction and increased consumption of fresh fruit, vegetables, and low fat dairy, alcohol moderation, and smoking cessation.    Expected Outcomes  Short Term: Continued assessment and intervention until BP is < 140/9074m  HG in hypertensive participants. < 130/68m HG in hypertensive participants with diabetes, heart failure or chronic kidney disease.;Long Term: Maintenance of blood pressure at goal levels.    Lipids  Yes    Intervention  Provide education and support for participant on nutrition & aerobic/resistive exercise along with prescribed medications to achieve LDL <774m HDL >4040m   Expected Outcomes  Short Term: Participant states understanding of desired cholesterol values and is compliant with medications prescribed. Participant is following exercise prescription and nutrition guidelines.;Long Term: Cholesterol controlled with medications as prescribed, with individualized exercise RX and with personalized nutrition plan. Value goals: LDL < 62m34mDL > 40 mg.    Stress  Yes    Intervention  Offer individual and/or small group education and counseling on adjustment to heart disease, stress management and health-related lifestyle change. Teach and support self-help strategies.;Refer participants experiencing significant psychosocial distress to appropriate mental health specialists for further evaluation and treatment. When possible, include family members and significant others in education/counseling sessions.    Expected Outcomes  Short Term: Participant demonstrates changes in health-related behavior, relaxation and other stress management skills, ability to obtain effective  social support, and compliance with psychotropic medications if prescribed.;Long Term: Emotional wellbeing is indicated by absence of clinically significant psychosocial distress or social isolation.           Core Components/Risk Factors/Patient Goals Review:  Goals and Risk Factor Review    Core Components/Risk Factors/Patient Goals Review    Row Name 01/24/19 1538 02/02/19 1121 03/01/19 1337   Personal Goals Review  Weight Management/Obesity;Hypertension;Lipids;Stress  no documentation  Weight Management/Obesity;Hypertension;Lipids;Stress   Review  pt with multiple CAD RF demonstrates eagerness to participate in CR opportunities. pt personal goals are "peace of mind" with physical activity and safely resume community gym activiites of swimming and treadmiall.   pt with multiple CAD RF demonstrates eagerness to participate in CR opportunities. pt personal goals are "peace of mind" with physical activity and safely resume community gym activiites of swimming and treadmiall. pt rerpots using eliptical (3.0, 1.4 mile)l at [facility] [employer] on off days from CR. pt activity limited by hip pain.    pt with multiple CAD RF demonstrates eagerness to participate in CR opportunities. pt personal goals are "peace of mind" with physical activity and safely resume community gym activiites of swimming and treadmill. pt rerpots using eliptical (3.0, 1.4 mile)l at [facility] on off days from CR. pt activity limited by hip pain.     Expected Outcomes  pt will participate in CR exercise, nutrition and lifestyle modification to decrease overall RF.   pt will participate in CR exercise, nutrition and lifestyle modification to decrease overall RF.   pt will participate in CR exercise, nutrition and lifestyle modification to decrease overall RF.           Core Components/Risk Factors/Patient Goals at Discharge (Final Review):  Goals and Risk Factor Review - 03/01/19 1337    Core Components/Risk Factors/Patient Goals Review           Personal Goals Review  Weight Management/Obesity;Hypertension;Lipids;Stress    Review  pt with multiple CAD RF demonstrates eagerness to participate in CR opportunities. pt personal goals are "peace of mind" with physical activity and safely resume community gym activiites of swimming and treadmill. pt rerpots using eliptical (3.0, 1.4 mile)l at [facility] [employer] on off days from CR. pt activity limited by hip pain.      Expected Outcomes  pt will participate in CR exercise, nutrition  and lifestyle modification to decrease overall RF.            ITP Comments: ITP Comments    Row Name 01/20/19 0930 01/24/19 1535 02/02/19 1120 03/01/19 1336   ITP Comments  Dr. Fransico Him, Medical Director  pt started group exercise.  pt tolerated light activity without difficulty. pt oriented to exercise equipment and safety routine. understanding verbalized.  30 day ITP review. pt with good attendance and participation. pt limited by hip pain.   30 day ITP review. pt with good attendance and participation. pt limited by hip pain.       Comments:

## 2019-03-04 ENCOUNTER — Ambulatory Visit (HOSPITAL_COMMUNITY): Payer: Medicare Other

## 2019-03-04 ENCOUNTER — Encounter (HOSPITAL_COMMUNITY)
Admission: RE | Admit: 2019-03-04 | Discharge: 2019-03-04 | Disposition: A | Discharge status: Home / Self Care | Payer: Medicare Other | Source: Ambulatory Visit | Attending: Cardiology | Admitting: Cardiology

## 2019-03-04 DIAGNOSIS — I2119 ST elevation (STEMI) myocardial infarction involving other coronary artery of inferior wall: Secondary | ICD-10-CM

## 2019-03-04 DIAGNOSIS — Z955 Presence of coronary angioplasty implant and graft: Secondary | ICD-10-CM | POA: Diagnosis not present

## 2019-03-07 ENCOUNTER — Ambulatory Visit (HOSPITAL_COMMUNITY): Payer: Medicare Other

## 2019-03-07 ENCOUNTER — Other Ambulatory Visit: Payer: Self-pay | Admitting: Internal Medicine

## 2019-03-07 ENCOUNTER — Encounter (HOSPITAL_COMMUNITY)
Admission: RE | Admit: 2019-03-07 | Discharge: 2019-03-07 | Disposition: A | Discharge status: Home / Self Care | Payer: Medicare Other | Source: Ambulatory Visit | Attending: Cardiology | Admitting: Cardiology

## 2019-03-07 DIAGNOSIS — I2119 ST elevation (STEMI) myocardial infarction involving other coronary artery of inferior wall: Secondary | ICD-10-CM | POA: Diagnosis not present

## 2019-03-07 DIAGNOSIS — Z955 Presence of coronary angioplasty implant and graft: Secondary | ICD-10-CM

## 2019-03-09 ENCOUNTER — Ambulatory Visit (HOSPITAL_COMMUNITY): Payer: Medicare Other

## 2019-03-09 ENCOUNTER — Encounter (HOSPITAL_COMMUNITY)
Admission: RE | Admit: 2019-03-09 | Discharge: 2019-03-09 | Disposition: A | Discharge status: Home / Self Care | Payer: Medicare Other | Source: Ambulatory Visit | Attending: Cardiology | Admitting: Cardiology

## 2019-03-09 ENCOUNTER — Other Ambulatory Visit: Payer: Self-pay

## 2019-03-09 DIAGNOSIS — I2119 ST elevation (STEMI) myocardial infarction involving other coronary artery of inferior wall: Secondary | ICD-10-CM | POA: Diagnosis not present

## 2019-03-09 DIAGNOSIS — Z955 Presence of coronary angioplasty implant and graft: Secondary | ICD-10-CM | POA: Diagnosis not present

## 2019-03-11 ENCOUNTER — Telehealth (HOSPITAL_COMMUNITY): Payer: Self-pay | Admitting: Internal Medicine

## 2019-03-11 ENCOUNTER — Ambulatory Visit (HOSPITAL_COMMUNITY): Payer: Medicare Other

## 2019-03-11 ENCOUNTER — Encounter (HOSPITAL_COMMUNITY): Payer: Medicare Other

## 2019-03-14 ENCOUNTER — Encounter (HOSPITAL_COMMUNITY): Payer: Medicare Other

## 2019-03-14 ENCOUNTER — Ambulatory Visit (HOSPITAL_COMMUNITY): Payer: Medicare Other

## 2019-03-14 ENCOUNTER — Telehealth (HOSPITAL_COMMUNITY): Payer: Self-pay | Admitting: Cardiac Rehabilitation

## 2019-03-14 NOTE — Telephone Encounter (Signed)
Phone call to pt to advise of CR Phase II departmental closing for 2 weeks.  Pt verbalized understanding.  Joann Rion, RN, BSN Cardiac Pulmonary Rehab 

## 2019-03-16 ENCOUNTER — Encounter (HOSPITAL_COMMUNITY): Payer: Medicare Other

## 2019-03-16 ENCOUNTER — Ambulatory Visit (HOSPITAL_COMMUNITY): Payer: Medicare Other

## 2019-03-18 ENCOUNTER — Ambulatory Visit (HOSPITAL_COMMUNITY): Payer: Medicare Other

## 2019-03-18 ENCOUNTER — Encounter (HOSPITAL_COMMUNITY): Payer: Medicare Other

## 2019-03-21 ENCOUNTER — Encounter (HOSPITAL_COMMUNITY): Payer: Medicare Other

## 2019-03-21 ENCOUNTER — Ambulatory Visit (HOSPITAL_COMMUNITY): Payer: Medicare Other

## 2019-03-22 ENCOUNTER — Telehealth (HOSPITAL_COMMUNITY): Payer: Self-pay

## 2019-03-22 ENCOUNTER — Encounter (HOSPITAL_COMMUNITY): Payer: Self-pay | Admitting: Cardiac Rehabilitation

## 2019-03-22 DIAGNOSIS — Z955 Presence of coronary angioplasty implant and graft: Secondary | ICD-10-CM

## 2019-03-22 DIAGNOSIS — I2119 ST elevation (STEMI) myocardial infarction involving other coronary artery of inferior wall: Secondary | ICD-10-CM

## 2019-03-22 NOTE — Progress Notes (Signed)
Cardiac Individual Treatment Plan  Patient Details  Name: [name] MRN: [id number] Date of Birth: November 28, 1951 Referring Provider:   Flowsheet Row CARDIAC REHAB PHASE II ORIENTATION from 01/20/2019 in [place]  Referring Provider  Dr. Radford Pax      Initial Encounter Date:  Flowsheet Row CARDIAC REHAB PHASE II ORIENTATION from 01/20/2019 in [place]  Date  01/20/19      Visit Diagnosis: Acute ST elevation myocardial infarction (STEMI) of inferior wall ([place]), 12/30/18  Status post coronary artery stent placement,DES RPDA 12/30/18  Patient's Home Medications on Admission:  Current Outpatient Medications:  .  acetaminophen (TYLENOL) 325 MG tablet, Take 2 tablets (650 mg total) by mouth every 4 (four) hours as needed for headache or mild pain., Disp: , Rfl:  .  aspirin 81 MG chewable tablet, Chew 1 tablet (81 mg total) by mouth daily., Disp: , Rfl:  .  atorvastatin (LIPITOR) 80 MG tablet, Take 1 tablet (80 mg total) by mouth daily at 6 PM., Disp: 90 tablet, Rfl: 3 .  baclofen (LIORESAL) 10 MG tablet, Take 1 tablet (10 mg total) by mouth every 8 (eight) hours as needed for muscle spasms., Disp: 30 each, Rfl: 0 .  Eszopiclone (ESZOPICLONE) 3 MG TABS, Take 1 tablet (3 mg total) by mouth at bedtime. Take immediately before bedtime, Disp: 1 tablet, Rfl: 0 .  levothyroxine (SYNTHROID, LEVOTHROID) 150 MCG tablet, Take 1 tablet (150 mcg total) by mouth daily. -- Office visit needed for further refills, Disp: 90 tablet, Rfl: 0 .  losartan (COZAAR) 50 MG tablet, Take 1 tablet (50 mg total) by mouth daily., Disp: 90 tablet, Rfl: 3 .  metoprolol succinate (TOPROL-XL) 25 MG 24 hr tablet, Take 1 tablet (25 mg total) by mouth daily., Disp: 90 tablet, Rfl: 3 .  Multiple Vitamins-Minerals (MULTIVITAMIN) tablet, Take 1 tablet by mouth daily., Disp: , Rfl:  .  nitroGLYCERIN (NITROSTAT) 0.4 MG SL tablet, Place 1 tablet (0.4 mg total) under the  tongue every 5 (five) minutes as needed for chest pain., Disp: 25 tablet, Rfl: 5 .  ticagrelor (BRILINTA) 90 MG TABS tablet, Take 1 tablet (90 mg total) by mouth 2 (two) times daily., Disp: 180 tablet, Rfl: 3  Past Medical History: Past Medical History:  Diagnosis Date  . Arthritis    Knees  . BPH (benign prostatic hypertrophy)   . COLONIC POLYPS, HX OF 2007   clear colo w/o polyps 04/2015: 72yrfollow up  . Coronary artery disease   . HYPERTENSION   . HYPOTHYROIDISM   . OBESITY   . PAF (paroxysmal atrial fibrillation) ([facility] 10/01/2018    Tobacco Use: Social History   Tobacco Use  Smoking Status Never Smoker  Smokeless Tobacco Never Used    Labs: Recent Review Flowsheet Data    Labs for ITP Cardiac and Pulmonary Rehab Latest Ref Rng & Units 12/30/2018 12/30/2018 12/30/2018 12/31/2018 02/21/2019   Cholestrol 100 - 199 mg/dL 198 - 167 - 80(L)   LDLCALC 0 - 99 mg/dL 93 - 111(H) - 34   LDLDIRECT mg/dL - - - - -   HDL >39 mg/dL 41 - 39(L) - 36(L)   Trlycerides 0 - 149 mg/dL 322(H) - 83 - 49   Hemoglobin A1c 4.8 - 5.6 % - - - 5.4 -   TCO2 22 - 32 mmol/L - 24 - - -      Capillary Blood Glucose: No results found for: GLUCAP   Exercise Target  Goals: Exercise Program Goal: Individual exercise prescription set using results from initial 6 min walk test and THRR while considering  patient's activity barriers and safety.   Exercise Prescription Goal: Initial exercise prescription builds to 30-45 minutes a day of aerobic activity, 2-3 days per week.  Home exercise guidelines will be given to patient during program as part of exercise prescription that the participant will acknowledge.  Activity Barriers & Risk Stratification: Activity Barriers & Cardiac Risk Stratification - 01/20/19 0942    Activity Barriers & Cardiac Risk Stratification          Activity Barriers  Left Knee Replacement;Right Hip Replacement;Right Knee Replacement;Left Hip Replacement;Back Problems    Cardiac Risk  Stratification  High           6 Minute Walk: 6 Minute Walk    6 Minute Walk    Row Name 01/20/19 0941   Phase  Initial   Distance  1453 feet   Walk Time  6 minutes   # of Rest Breaks  0   MPH  2.7   METS  2.9   RPE  11   VO2 Peak  10.41   Symptoms  No   Resting HR  69 bpm   Resting BP  122/70   Resting Oxygen Saturation   97 %   Exercise Oxygen Saturation  during 6 min walk  97 %   Max Ex. HR  107 bpm   Max Ex. BP  140/78   2 Minute Post BP  114/82          Oxygen Initial Assessment:   Oxygen Re-Evaluation:   Oxygen Discharge (Final Oxygen Re-Evaluation):   Initial Exercise Prescription: Initial Exercise Prescription - 01/20/19 0900    Date of Initial Exercise RX and Referring Provider          Date  01/20/19    Referring Provider  Dr. Radford Pax    Expected Discharge Date  04/25/19        Treadmill          MPH  2.5    Grade  1    Minutes  10        Bike          Level  1    Minutes  10    METs  2.51        NuStep          Level  3    SPM  85    Minutes  10    METs  2.5        Prescription Details          Frequency (times per week)  3    Duration  Progress to 30 minutes of continuous aerobic without signs/symptoms of physical distress        Intensity          THRR 40-80% of Max Heartrate  62-124    Ratings of Perceived Exertion  11-13        Progression          Progression  Continue to progress workloads to maintain intensity without signs/symptoms of physical distress.        Resistance Training          Training Prescription  Yes    Weight  4 lbs.    Reps  10-15           Perform Capillary Blood Glucose checks as needed.  Exercise Prescription Changes: Exercise Prescription  Changes    Response to Exercise    Row Name 01/24/19 0900 01/31/19 1000 02/04/19 0900 02/22/19 1300 03/09/19 0928   Blood Pressure (Admit)  128/70  120/82  124/72  122/72  130/78   Blood Pressure (Exercise)  140/70  130/68  144/80  132/78   138/84   Blood Pressure (Exit)  132/80  120/72  140/64  118/72  130/70   Heart Rate (Admit)  85 bpm  68 bpm  70 bpm  62 bpm  63 bpm   Heart Rate (Exercise)  107 bpm  122 bpm  105 bpm  108 bpm  111 bpm   Heart Rate (Exit)  84 bpm  75 bpm  74 bpm  71 bpm  68 bpm   Rating of Perceived Exertion (Exercise)  _0 Perceived Dyspnea (Exercise)  0  0  0  0  0   Symptoms  None  None  None  None  None   Comments  Pt's first day of exercise   no documentation  no documentation  no documentation  no documentation   Duration  Progress to 30 minutes of  aerobic without signs/symptoms of physical distress  Progress to 30 minutes of  aerobic without signs/symptoms of physical distress  Progress to 30 minutes of  aerobic without signs/symptoms of physical distress  Progress to 30 minutes of  aerobic without signs/symptoms of physical distress  Progress to 30 minutes of  aerobic without signs/symptoms of physical distress   Intensity  THRR unchanged  THRR unchanged  THRR unchanged  THRR unchanged  THRR unchanged       Progression    Row Name 01/24/19 0900 01/31/19 1000 02/04/19 0900 02/22/19 1300 03/09/19 0928   Progression  Continue to progress workloads to maintain intensity without signs/symptoms of physical distress.  Continue to progress workloads to maintain intensity without signs/symptoms of physical distress.  Continue to progress workloads to maintain intensity without signs/symptoms of physical distress.  Continue to progress workloads to maintain intensity without signs/symptoms of physical distress.  Continue to progress workloads to maintain intensity without signs/symptoms of physical distress.   Average METs  2.83  4.8  3.6  5.5  6.5       Resistance Training    Row Name 01/24/19 0900 01/31/19 1000 02/04/19 0900 02/22/19 1300 03/09/19 0928   Training Prescription  Yes  Yes  Yes  Yes  No   Weight  4lbs  4lbs  6 lbs.   6 lbs.   no documentation   Reps  10-15  10-15  10-15  10-15  no  documentation   Time  10 Minutes  10 Minutes  10 Minutes  10 Minutes  no documentation       Treadmill    Row Name 01/24/19 0900 01/31/19 1000 02/04/19 0900 02/22/19 1300 03/09/19 0928   MPH  2.5  2.6  no documentation  no documentation  no documentation   Grade  10  2  no documentation  no documentation  no documentation   Minutes  10  10  no documentation  no documentation  no documentation   METs  3.26  3.71  no documentation  no documentation  no documentation       [place] Name 01/24/19 0900 01/31/19 1000 02/04/19 0900 02/22/19 1300 03/09/19 0928   Level  _1 SPM  85  85  85  85  85   Minutes  _0 METs  2.4  4  3.2  4.4  5       Arm Ergometer    Row Name 01/24/19 0900 01/31/19 1000 02/04/19 0900 02/22/19 1300 03/09/19 0928   Level  no documentation  no documentation  _1 Watts  no documentation  no documentation  _2 Minutes  no documentation  no documentation  _3 METs  no documentation  no documentation  4  6.6  7.9       Elliptical    Row Name 01/24/19 0900 01/31/19 1000 02/04/19 0900 02/22/19 1300 03/09/19 0928   Level  no documentation  1  no documentation  no documentation  no documentation   Speed  no documentation  1  no documentation  no documentation  no documentation   Minutes  no documentation  10  no documentation  no documentation  no documentation   METs  no documentation  6.7  no documentation  no documentation  no documentation       Home Exercise Plan    [place] Name 01/24/19 0900 01/31/19 1000 02/04/19 0900 02/22/19 1300 03/09/19 0928   Plans to continue exercise at  no documentation  no documentation  Home (comment)  Home (comment)  Home (comment)   Frequency  no documentation  no documentation  Add 3 additional days to program exercise sessions.  Add 3 additional days to program exercise sessions.  Add 3 additional days to program exercise sessions.   Initial Home Exercises Provided  no  documentation  no documentation  02/04/19  02/04/19  02/04/19          Exercise Comments: Exercise Comments    Row Name 01/24/19 0940 02/02/19 0959 02/04/19 1001 03/01/19 1419 03/17/19 0930   Exercise Comments  Pt's first day of exercise. Pt oriented to exercise equipment. Pt reported knee pain with [employer] bike. Will remove from exercise prescription. Will continue to monitor pt and progress as tolerated.   Reviewed METs and goals with Pt. Pt is tolerating exercise Rx well.   Reviewed HEP with Pt. Pt was responsive and understands goals.   Reviewed METs and goals with Pt. Pt is tolerating exercise well.   Unable to review METs and goals with Pt due to closure of Cardiac Rehab for [place].       Exercise Goals and Review: Exercise Goals    Exercise Goals    Row Name 01/20/19 0942   Increase Physical Activity  Yes   Intervention  Provide advice, education, support and counseling about physical activity/exercise needs.;Develop an individualized exercise prescription for aerobic and resistive training based on initial evaluation findings, risk stratification, comorbidities and participant's personal goals.   Expected Outcomes  Short Term: Attend rehab on a regular basis to increase amount of physical activity.   Increase Strength and Stamina  Yes   Intervention  Provide advice, education, support and counseling about physical activity/exercise needs.;Develop an individualized exercise prescription for aerobic and resistive training based on initial evaluation findings, risk stratification, comorbidities and participant's personal goals.   Expected Outcomes  Short Term: Increase workloads from initial exercise prescription for resistance, speed, and METs.   Able to understand and use rate of perceived exertion (RPE) scale  Yes   Intervention  Provide education and explanation on how to  use RPE scale   Expected Outcomes  Short Term: Able to use RPE daily in rehab to express subjective intensity  level;Long Term:  Able to use RPE to guide intensity level when exercising independently   Knowledge and understanding of Target Heart Rate Range (THRR)  Yes   Intervention  Provide education and explanation of THRR including how the numbers were predicted and where they are located for reference   Expected Outcomes  Short Term: Able to state/look up THRR;Long Term: Able to use THRR to govern intensity when exercising independently;Short Term: Able to use daily as guideline for intensity in rehab   Able to check pulse independently  Yes   Intervention  Provide education and demonstration on how to check pulse in carotid and radial arteries.;Review the importance of being able to check your own pulse for safety during independent exercise   Expected Outcomes  Short Term: Able to explain why pulse checking is important during independent exercise;Long Term: Able to check pulse independently and accurately   Understanding of Exercise Prescription  Yes   Intervention  Provide education, explanation, and written materials on patient's individual exercise prescription   Expected Outcomes  Short Term: Able to explain program exercise prescription;Long Term: Able to explain home exercise prescription to exercise independently          Exercise Goals Re-Evaluation : Exercise Goals Re-Evaluation    Exercise Goal Re-Evaluation    Row Name 02/02/19 0956 02/04/19 0954 03/01/19 1417 03/17/19 0930   Exercise Goals Review  Increase Physical Activity;Increase Strength and Stamina;Able to understand and use rate of perceived exertion (RPE) scale;Knowledge and understanding of Target Heart Rate Range (THRR);Understanding of Exercise Prescription  Increase Physical Activity;Increase Strength and Stamina;Able to understand and use rate of perceived exertion (RPE) scale;Knowledge and understanding of Target Heart Rate Range (THRR);Understanding of Exercise Prescription  Increase Physical Activity;Increase Strength and  Stamina;Able to understand and use rate of perceived exertion (RPE) scale;Knowledge and understanding of Target Heart Rate Range (THRR);Understanding of Exercise Prescription  Increase Physical Activity;Increase Strength and Stamina;Able to understand and use rate of perceived exertion (RPE) scale;Knowledge and understanding of Target Heart Rate Range (THRR);Understanding of Exercise Prescription   Comments  Reviewed METs and goals with Pt. Pt is new to the program and has a MET level of 4.8. Tolerates exercise Rx well and improves with each session. Pt is going to a local gym 2-4 days per week and exercising 30-50 minutes in addition to the program.   Reviewed HEP with Pt. Pt understands home exercise goals, NTG use, exercise precautions, weather precautions, and enpoints of exericse. Pt is exercising at home and a loacal gym 2-4 days per week for 30-45 minutes in addition to Cardiac Rehab.   Reviewed METs and goals with Pt. Pt MET level is 5.5. Pt is tolerating exercise well and increasing workloads gradually. Pt is exercising at a local gym in the pool 5-7 days a week for 30-45 minutes in addition to Cardiac Rehab.   Unable to review METs and goals with Pt due to closure of Cardiac Rehab for [place]. Pt MET level is 6.5 and is progressing well. Pt is tolerating exercise Rx well.    Expected Outcomes  Will continue to monitor and progress Pt as tolerated.   Will continue to monitor and progress Pt as tolerated.   Will continue to monitor and progress Pt as tolerated.   Will continue to monitor and progress Pt as tolerated.  Discharge Exercise Prescription (Final Exercise Prescription Changes): Exercise Prescription Changes - 03/09/19 0928    Response to Exercise          Blood Pressure (Admit)  130/78    Blood Pressure (Exercise)  138/84    Blood Pressure (Exit)  130/70    Heart Rate (Admit)  63 bpm    Heart Rate (Exercise)  111 bpm    Heart Rate (Exit)  68 bpm    Rating of Perceived  Exertion (Exercise)  13    Perceived Dyspnea (Exercise)  0    Symptoms  None    Duration  Progress to 30 minutes of  aerobic without signs/symptoms of physical distress    Intensity  THRR unchanged        Progression          Progression  Continue to progress workloads to maintain intensity without signs/symptoms of physical distress.    Average METs  6.5        Resistance Training          Training Prescription  No        NuStep          Level  6    SPM  85    Minutes  10    METs  5        Arm Ergometer          Level  5    Watts  25    Minutes  10    METs  7.9        Home Exercise Plan          Plans to continue exercise at  Home (comment)    Frequency  Add 3 additional days to program exercise sessions.    Initial Home Exercises Provided  02/04/19           Nutrition:  Target Goals: Understanding of nutrition guidelines, daily intake of sodium <1527m, cholesterol <2040m calories 30% from fat and 7% or less from saturated fats, daily to have 5 or more servings of fruits and vegetables.  Biometrics: Pre Biometrics - 01/20/19 0925    Pre Biometrics          Waist Circumference  51.5 inches    Hip Circumference  47 inches    Waist to Hip Ratio  1.1 %    Triceps Skinfold  31 mm    % Body Fat  39.1 %    Grip Strength  33 kg    Flexibility  0 in    Single Leg Stand  3.75 seconds            Nutrition Therapy Plan and Nutrition Goals: Nutrition Therapy & Goals - 01/31/19 0915    Nutrition Therapy          Diet  heart healthy, carb modified        Personal Nutrition Goals          Nutrition Goal  Pt to identify and limit food sources of saturated fat, trans fat, refined carbohydrates and sodium    Personal Goal #2  Pt to identify food quantities necessary to achieve weight loss of 6-24 lb at graduation from cardiac rehab.     Personal Goal #3  Pt to build a healthy plate including vegetables, fruits, whole grains, lean protein and low-fat dairy  products in a heart healthy meal plan.    Personal Goal #4  Pt to weigh and measure serving sizes  Intervention Plan          Intervention  Prescribe, educate and counsel regarding individualized specific dietary modifications aiming towards targeted core components such as weight, hypertension, lipid management, diabetes, heart failure and other comorbidities.    Expected Outcomes  Short Term Goal: Understand basic principles of dietary content, such as calories, fat, sodium, cholesterol and nutrients.;Long Term Goal: Adherence to prescribed nutrition plan.           Nutrition Assessments: Nutrition Assessments - 01/20/19 1153    MEDFICTS Scores          Pre Score  21           Nutrition Goals Re-Evaluation: Nutrition Goals Re-Evaluation    Goals    Row Name 01/20/19 1154 01/31/19 0915   Current Weight  278 lb 7.1 oz (126.3 kg)  278 lb 7.1 oz (126.3 kg)          Nutrition Goals Re-Evaluation: Nutrition Goals Re-Evaluation    Goals    Row Name 01/20/19 1154 01/31/19 0915   Current Weight  278 lb 7.1 oz (126.3 kg)  278 lb 7.1 oz (126.3 kg)          Nutrition Goals Discharge (Final Nutrition Goals Re-Evaluation): Nutrition Goals Re-Evaluation - 01/31/19 0915    Goals          Current Weight  278 lb 7.1 oz (126.3 kg)           Psychosocial: Target Goals: Acknowledge presence or absence of significant depression and/or stress, maximize coping skills, provide positive support system. Participant is able to verbalize types and ability to use techniques and skills needed for reducing stress and depression.  Initial Review & Psychosocial Screening: Initial Psych Review & Screening - 01/20/19 1039    Initial Review          Current issues with  None Identified        Family Dynamics          Good Support System?  Yes   [name] has his wife for support       Barriers          Psychosocial barriers to participate in program  There are no identifiable  barriers or psychosocial needs.        Screening Interventions          Interventions  Encouraged to exercise           Quality of Life Scores: Quality of Life - 01/20/19 0944    Quality of Life          Select  Quality of Life        Quality of Life Scores          Health/Function Pre  24.03 %    Socioeconomic Pre  27.5 %    Psych/Spiritual Pre  22.29 %    Family Pre  28.8 %    GLOBAL Pre  25.09 %          Scores of 19 and below usually indicate a poorer quality of life in these areas.  A difference of  2-3 points is a clinically meaningful difference.  A difference of 2-3 points in the total score of the Quality of Life Index has been associated with significant improvement in overall quality of life, self-image, physical symptoms, and general health in studies assessing change in quality of life.  PHQ-9: Recent Review Flowsheet Data    Depression screen [facility] 2/9 01/24/2019 08/19/2018 05/12/2018 03/30/2014  Decreased Interest 0 0 0 0   Down, Depressed, Hopeless 0 0 0 0   PHQ - 2 Score 0 0 0 0   Altered sleeping - - 2 -   Tired, decreased energy - - 1 -   Change in appetite - - 0 -   Feeling bad or failure about yourself  - - 0 -   Trouble concentrating - - 0 -   Moving slowly or fidgety/restless - - 0 -   Suicidal thoughts - - 0 -   PHQ-9 Score - - 3 -   Difficult doing work/chores - - Not difficult at all -     Interpretation of Total Score  Total Score Depression Severity:  1-4 = Minimal depression, 5-9 = Mild depression, 10-14 = Moderate depression, 15-19 = Moderately severe depression, 20-27 = Severe depression   Psychosocial Evaluation and Intervention: Psychosocial Evaluation - 01/24/19 1536    Psychosocial Evaluation & Interventions          Interventions  Encouraged to exercise with the program and follow exercise prescription    Comments  no psychosocial needs identified, no interventions necessary.  pt enjoys spending time with grandchildren and garden.       Expected Outcomes  pt will exhibit positive outlook with good coping skills.     Continue Psychosocial Services   No Follow up required           Psychosocial Re-Evaluation: Psychosocial Re-Evaluation    Psychosocial Re-Evaluation    [place] Name 02/02/19 1121 03/01/19 1337 03/22/19 1536   Current issues with  None Identified  None Identified  None Identified   Comments  no psychosocial needs identified, no interventions necessary   no psychosocial needs identified, no interventions necessary   no psychosocial needs identified, no interventions necessary    Expected Outcomes  pt will exhibit positive outlook with good coping skills.   pt will exhibit positive outlook with good coping skills.   pt will exhibit positive outlook with good coping skills.    Interventions  Encouraged to attend Cardiac Rehabilitation for the exercise  Encouraged to attend Cardiac Rehabilitation for the exercise  Encouraged to attend Cardiac Rehabilitation for the exercise   Continue Psychosocial Services   No Follow up required  No Follow up required  No Follow up required          Psychosocial Discharge (Final Psychosocial Re-Evaluation): Psychosocial Re-Evaluation - 03/22/19 1536    Psychosocial Re-Evaluation          Current issues with  None Identified    Comments  no psychosocial needs identified, no interventions necessary     Expected Outcomes  pt will exhibit positive outlook with good coping skills.     Interventions  Encouraged to attend Cardiac Rehabilitation for the exercise    Continue Psychosocial Services   No Follow up required           Vocational Rehabilitation: Provide vocational rehab assistance to qualifying candidates.   Vocational Rehab Evaluation & Intervention: Vocational Rehab - 01/20/19 1042    Initial Vocational Rehab Evaluation & Intervention          Assessment shows need for Vocational Rehabilitation  No   [name] is retired and does not need vocational rehab at  this time          Education: Education Goals: Education classes will be provided on a weekly basis, covering required topics. Participant will state understanding/return demonstration of topics presented.  Learning Barriers/Preferences: Learning Barriers/Preferences -  01/20/19 0944    Learning Barriers/Preferences          Learning Barriers  Sight    Learning Preferences  None           Education Topics: Count Your Pulse:  -Group instruction provided by verbal instruction, demonstration, patient participation and written materials to support subject.  Instructors address importance of being able to find your pulse and how to count your pulse when at home without a heart monitor.  Patients get hands on experience counting their pulse with staff help and individually.   Heart Attack, Angina, and Risk Factor Modification:  -Group instruction provided by verbal instruction, video, and written materials to support subject.  Instructors address signs and symptoms of angina and heart attacks.    Also discuss risk factors for heart disease and how to make changes to improve heart health risk factors.   Functional Fitness:  -Group instruction provided by verbal instruction, demonstration, patient participation, and written materials to support subject.  Instructors address safety measures for doing things around the house.  Discuss how to get up and down off the floor, how to pick things up properly, how to safely get out of a chair without assistance, and balance training. Flowsheet Row CARDIAC REHAB PHASE II EXERCISE from 03/02/2019 in [place]  Date  02/18/19  Educator  EP  Instruction Review Code  2- Demonstrated Understanding      Meditation and Mindfulness:  -Group instruction provided by verbal instruction, patient participation, and written materials to support subject.  Instructor addresses importance of mindfulness and meditation practice to  help reduce stress and improve awareness.  Instructor also leads participants through a meditation exercise.  Flowsheet Row CARDIAC REHAB PHASE II EXERCISE from 03/02/2019 in [place]  Date  03/02/19  Instruction Review Code  2- Demonstrated Understanding      Stretching for Flexibility and Mobility:  -Group instruction provided by verbal instruction, patient participation, and written materials to support subject.  Instructors lead participants through series of stretches that are designed to increase flexibility thus improving mobility.  These stretches are additional exercise for major muscle groups that are typically performed during regular warm up and cool down.   Hands Only CPR:  -Group verbal, video, and participation provides a basic overview of AHA guidelines for community CPR. Role-play of emergencies allow participants the opportunity to practice calling for help and chest compression technique with discussion of AED use.   Hypertension: -Group verbal and written instruction that provides a basic overview of hypertension including the most recent diagnostic guidelines, risk factor reduction with self-care instructions and medication management.    Nutrition I class: Heart Healthy Eating:  -Group instruction provided by PowerPoint slides, verbal discussion, and written materials to support subject matter. The instructor gives an explanation and review of the Therapeutic Lifestyle Changes diet recommendations, which includes a discussion on lipid goals, dietary fat, sodium, fiber, plant stanol/sterol esters, sugar, and the components of a well-balanced, healthy diet.   Nutrition II class: Lifestyle Skills:  -Group instruction provided by PowerPoint slides, verbal discussion, and written materials to support subject matter. The instructor gives an explanation and review of label reading, grocery shopping for heart health, heart healthy recipe  modifications, and ways to make healthier choices when eating out.   Diabetes Question & Answer:  -Group instruction provided by PowerPoint slides, verbal discussion, and written materials to support subject matter. The instructor gives an explanation and review  of diabetes co-morbidities, pre- and post-prandial blood glucose goals, pre-exercise blood glucose goals, signs, symptoms, and treatment of hypoglycemia and hyperglycemia, and foot care basics.   Diabetes Blitz:  -Group instruction provided by PowerPoint slides, verbal discussion, and written materials to support subject matter. The instructor gives an explanation and review of the physiology behind type 1 and type 2 diabetes, diabetes medications and rational behind using different medications, pre- and post-prandial blood glucose recommendations and Hemoglobin A1c goals, diabetes diet, and exercise including blood glucose guidelines for exercising safely.    Portion Distortion:  -Group instruction provided by PowerPoint slides, verbal discussion, written materials, and food models to support subject matter. The instructor gives an explanation of serving size versus portion size, changes in portions sizes over the last 20 years, and what consists of a serving from each food group.   Stress Management:  -Group instruction provided by verbal instruction, video, and written materials to support subject matter.  Instructors review role of stress in heart disease and how to cope with stress positively.     Exercising on Your Own:  -Group instruction provided by verbal instruction, power point, and written materials to support subject.  Instructors discuss benefits of exercise, components of exercise, frequency and intensity of exercise, and end points for exercise.  Also discuss use of nitroglycerin and activating EMS.  Review options of places to exercise outside of rehab.  Review guidelines for sex with heart disease.   Cardiac Drugs I:   -Group instruction provided by verbal instruction and written materials to support subject.  Instructor reviews cardiac drug classes: antiplatelets, anticoagulants, beta blockers, and statins.  Instructor discusses reasons, side effects, and lifestyle considerations for each drug class.   Cardiac Drugs II:  -Group instruction provided by verbal instruction and written materials to support subject.  Instructor reviews cardiac drug classes: angiotensin converting enzyme inhibitors (ACE-I), angiotensin II receptor blockers (ARBs), nitrates, and calcium channel blockers.  Instructor discusses reasons, side effects, and lifestyle considerations for each drug class.   Anatomy and Physiology of the Circulatory System:  Group verbal and written instruction and models provide basic cardiac anatomy and physiology, with the coronary electrical and arterial systems. Review of: AMI, Angina, Valve disease, Heart Failure, Peripheral Artery Disease, Cardiac Arrhythmia, Pacemakers, and the ICD.   Other Education:  -Group or individual verbal, written, or video instructions that support the educational goals of the cardiac rehab program.   Holiday Eating Survival Tips:  -Group instruction provided by PowerPoint slides, verbal discussion, and written materials to support subject matter. The instructor gives patients tips, tricks, and techniques to help them not only survive but enjoy the holidays despite the onslaught of food that accompanies the holidays.   Knowledge Questionnaire Score: Knowledge Questionnaire Score - 01/20/19 0945    Knowledge Questionnaire Score          Pre Score  23/24           Core Components/Risk Factors/Patient Goals at Admission: Personal Goals and Risk Factors at Admission - 01/20/19 0945    Core Components/Risk Factors/Patient Goals on Admission           Weight Management  Yes;Obesity;Weight Maintenance;Weight Loss    Intervention  Weight Management: Develop a combined  nutrition and exercise program designed to reach desired caloric intake, while maintaining appropriate intake of nutrient and fiber, sodium and fats, and appropriate energy expenditure required for the weight goal.;Weight Management: Provide education and appropriate resources to help participant work on and attain dietary goals.;Weight  Management/Obesity: Establish reasonable short term and long term weight goals.;Obesity: Provide education and appropriate resources to help participant work on and attain dietary goals.    Admit Weight  278 lb 7.1 oz (126.3 kg)    Expected Outcomes  Short Term: Continue to assess and modify interventions until short term weight is achieved;Long Term: Adherence to nutrition and physical activity/exercise program aimed toward attainment of established weight goal;Weight Maintenance: Understanding of the daily nutrition guidelines, which includes 25-35% calories from fat, 7% or less cal from saturated fats, less than 267m cholesterol, less than 1.5gm of sodium, & 5 or more servings of fruits and vegetables daily;Understanding recommendations for meals to include 15-35% energy as protein, 25-35% energy from fat, 35-60% energy from carbohydrates, less than 2038mof dietary cholesterol, 20-35 gm of total fiber daily;Understanding of distribution of calorie intake throughout the day with the consumption of 4-5 meals/snacks;Weight Loss: Understanding of general recommendations for a balanced deficit meal plan, which promotes 1-2 lb weight loss per week and includes a negative energy balance of [phone] kcal/d    Hypertension  Yes    Intervention  Monitor prescription use compliance.;Provide education on lifestyle modifcations including regular physical activity/exercise, weight management, moderate sodium restriction and increased consumption of fresh fruit, vegetables, and low fat dairy, alcohol moderation, and smoking cessation.    Expected Outcomes  Short Term: Continued assessment  and intervention until BP is < 140/9061mG in hypertensive participants. < 130/69m104m in hypertensive participants with diabetes, heart failure or chronic kidney disease.;Long Term: Maintenance of blood pressure at goal levels.    Lipids  Yes    Intervention  Provide education and support for participant on nutrition & aerobic/resistive exercise along with prescribed medications to achieve LDL <70mg60mL >40mg.67mExpected Outcomes  Short Term: Participant states understanding of desired cholesterol values and is compliant with medications prescribed. Participant is following exercise prescription and nutrition guidelines.;Long Term: Cholesterol controlled with medications as prescribed, with individualized exercise RX and with personalized nutrition plan. Value goals: LDL < 70mg, 11m> 40 mg.    Stress  Yes    Intervention  Offer individual and/or small group education and counseling on adjustment to heart disease, stress management and health-related lifestyle change. Teach and support self-help strategies.;Refer participants experiencing significant psychosocial distress to appropriate mental health specialists for further evaluation and treatment. When possible, include family members and significant others in education/counseling sessions.    Expected Outcomes  Short Term: Participant demonstrates changes in health-related behavior, relaxation and other stress management skills, ability to obtain effective social support, and compliance with psychotropic medications if prescribed.;Long Term: Emotional wellbeing is indicated by absence of clinically significant psychosocial distress or social isolation.           Core Components/Risk Factors/Patient Goals Review:  Goals and Risk Factor Review    Core Components/Risk Factors/Patient Goals Review    Row Name 01/24/19 1538 02/02/19 1121 03/01/19 1337 03/22/19 1537   Personal Goals Review  Weight Management/Obesity;Hypertension;Lipids;Stress  no  documentation  Weight Management/Obesity;Hypertension;Lipids;Stress  Weight Management/Obesity;Hypertension;Lipids;Stress   Review  pt with multiple CAD RF demonstrates eagerness to participate in CR opportunities. pt personal goals are "peace of mind" with physical activity and safely resume community gym activiites of swimming and treadmiall.   pt with multiple CAD RF demonstrates eagerness to participate in CR opportunities. pt personal goals are "peace of mind" with physical activity and safely resume community gym activiites of swimming and treadmiall. pt rerpots using eliptical (3.0, 1.4  mile)l at [facility] on off days from CR. pt activity limited by hip pain.    pt with multiple CAD RF demonstrates eagerness to participate in CR opportunities. pt personal goals are "peace of mind" with physical activity and safely resume community gym activiites of swimming and treadmill. pt rerpots using eliptical (3.0, 1.4 mile)l at [facility] on off days from CR. pt activity limited by hip pain.    pt with multiple CAD RF demonstrates eagerness to participate in CR opportunities. pt personal goals are "peace of mind" with physical activity and safely resume community gym activiites of swimming and treadmill. pt rerpots using eliptical (3.0, 1.4 mile)l at [facility] on off days from CR. pt activity limited by hip pain.     Expected Outcomes  pt will participate in CR exercise, nutrition and lifestyle modification to decrease overall RF.   pt will participate in CR exercise, nutrition and lifestyle modification to decrease overall RF.   pt will participate in CR exercise, nutrition and lifestyle modification to decrease overall RF.   pt will participate in CR exercise, nutrition and lifestyle modification to decrease overall RF.           Core Components/Risk Factors/Patient Goals at Discharge (Final Review):  Goals and Risk Factor Review - 03/22/19 1537    Core Components/Risk Factors/Patient Goals Review           Personal Goals Review  Weight Management/Obesity;Hypertension;Lipids;Stress    Review  pt with multiple CAD RF demonstrates eagerness to participate in CR opportunities. pt personal goals are "peace of mind" with physical activity and safely resume community gym activiites of swimming and treadmill. pt rerpots using eliptical (3.0, 1.4 mile)l at [facility] on off days from CR. pt activity limited by hip pain.      Expected Outcomes  pt will participate in CR exercise, nutrition and lifestyle modification to decrease overall RF.            ITP Comments: ITP Comments    Row Name 01/20/19 0930 01/24/19 1535 02/02/19 1120 03/01/19 1336 03/22/19 1535   ITP Comments  Dr. Fransico Him, Medical Director  pt started group exercise.  pt tolerated light activity without difficulty. pt oriented to exercise equipment and safety routine. understanding verbalized.  30 day ITP review. pt with good attendance and participation. pt limited by hip pain.   30 day ITP review. pt with good attendance and participation. pt limited by hip pain.   30 day ITP review. pt currently on hold for CR dept closure as part of  COVID 19 precautions.       Comments:  Andi Hence, RN, BSN Cardiac Pulmonary Rehab

## 2019-03-22 NOTE — Telephone Encounter (Signed)
Pt called and updated about extended closure of Cardiac Rehab for four weeks d/t COVID19. Tentative reopen date of 04/18/2019. Pt was responsive and understands information.

## 2019-03-23 ENCOUNTER — Encounter (HOSPITAL_COMMUNITY): Payer: Medicare Other

## 2019-03-23 ENCOUNTER — Ambulatory Visit (HOSPITAL_COMMUNITY): Payer: Medicare Other

## 2019-03-25 ENCOUNTER — Ambulatory Visit (HOSPITAL_COMMUNITY): Payer: Medicare Other

## 2019-03-25 ENCOUNTER — Encounter (HOSPITAL_COMMUNITY): Payer: Medicare Other

## 2019-03-28 ENCOUNTER — Encounter (HOSPITAL_COMMUNITY): Payer: Medicare Other

## 2019-03-28 ENCOUNTER — Ambulatory Visit (HOSPITAL_COMMUNITY): Payer: Medicare Other

## 2019-03-30 ENCOUNTER — Ambulatory Visit (HOSPITAL_COMMUNITY): Payer: Medicare Other

## 2019-03-30 ENCOUNTER — Encounter (HOSPITAL_COMMUNITY): Payer: Medicare Other

## 2019-04-01 ENCOUNTER — Ambulatory Visit (HOSPITAL_COMMUNITY): Payer: Medicare Other

## 2019-04-01 ENCOUNTER — Encounter (HOSPITAL_COMMUNITY): Payer: Medicare Other

## 2019-04-04 ENCOUNTER — Ambulatory Visit (HOSPITAL_COMMUNITY): Payer: Medicare Other

## 2019-04-04 ENCOUNTER — Encounter (HOSPITAL_BASED_OUTPATIENT_CLINIC_OR_DEPARTMENT_OTHER): Payer: Medicare Other

## 2019-04-04 ENCOUNTER — Encounter (HOSPITAL_COMMUNITY): Payer: Medicare Other

## 2019-04-06 ENCOUNTER — Encounter (HOSPITAL_COMMUNITY): Payer: Medicare Other

## 2019-04-06 ENCOUNTER — Telehealth (HOSPITAL_COMMUNITY): Payer: Self-pay | Admitting: Cardiac Rehabilitation

## 2019-04-06 ENCOUNTER — Ambulatory Visit (HOSPITAL_COMMUNITY): Payer: Medicare Other

## 2019-04-06 NOTE — Telephone Encounter (Signed)
Pt phone call to inform of continued Outpatient Cardiac Rehab departmental closure for COVID 19 precautions. Future opening date to be determined. Left message on answering machine.  Joann Rion, RN, BSN Cardiac Pulmonary Rehab 

## 2019-04-08 ENCOUNTER — Ambulatory Visit (HOSPITAL_COMMUNITY): Payer: Medicare Other

## 2019-04-08 ENCOUNTER — Encounter (HOSPITAL_COMMUNITY): Payer: Medicare Other

## 2019-04-11 ENCOUNTER — Encounter (HOSPITAL_COMMUNITY): Payer: Medicare Other

## 2019-04-11 ENCOUNTER — Ambulatory Visit (HOSPITAL_COMMUNITY): Payer: Medicare Other

## 2019-04-13 ENCOUNTER — Ambulatory Visit (HOSPITAL_COMMUNITY): Payer: Medicare Other

## 2019-04-13 ENCOUNTER — Encounter (HOSPITAL_COMMUNITY): Payer: Medicare Other

## 2019-04-15 ENCOUNTER — Ambulatory Visit (HOSPITAL_COMMUNITY): Payer: Medicare Other

## 2019-04-15 ENCOUNTER — Encounter (HOSPITAL_COMMUNITY): Payer: Medicare Other

## 2019-04-18 ENCOUNTER — Ambulatory Visit (HOSPITAL_COMMUNITY): Payer: Medicare Other

## 2019-04-18 ENCOUNTER — Ambulatory Visit: Payer: Medicare Other | Admitting: Cardiology

## 2019-04-18 ENCOUNTER — Encounter (HOSPITAL_COMMUNITY): Payer: Medicare Other

## 2019-04-20 ENCOUNTER — Ambulatory Visit (HOSPITAL_COMMUNITY): Payer: Medicare Other

## 2019-04-20 ENCOUNTER — Encounter (HOSPITAL_COMMUNITY): Payer: Medicare Other

## 2019-04-22 ENCOUNTER — Ambulatory Visit (HOSPITAL_COMMUNITY): Payer: Medicare Other

## 2019-04-22 ENCOUNTER — Encounter (HOSPITAL_COMMUNITY): Payer: Medicare Other

## 2019-04-25 ENCOUNTER — Ambulatory Visit (HOSPITAL_COMMUNITY): Payer: Medicare Other

## 2019-04-25 ENCOUNTER — Encounter (HOSPITAL_COMMUNITY): Payer: Medicare Other

## 2019-04-27 ENCOUNTER — Encounter (HOSPITAL_COMMUNITY): Payer: Medicare Other

## 2019-04-27 ENCOUNTER — Ambulatory Visit (HOSPITAL_COMMUNITY): Payer: Medicare Other

## 2019-04-29 ENCOUNTER — Other Ambulatory Visit: Payer: Self-pay | Admitting: Internal Medicine

## 2019-04-29 ENCOUNTER — Ambulatory Visit (HOSPITAL_COMMUNITY): Payer: Medicare Other

## 2019-04-29 ENCOUNTER — Telehealth: Payer: Self-pay | Admitting: Internal Medicine

## 2019-04-29 MED ORDER — LEVOTHYROXINE SODIUM 150 MCG PO TABS: 150.0000 ug | ORAL_TABLET | Freq: Every day | ORAL | 0 refills | Status: DC

## 2019-04-29 NOTE — Telephone Encounter (Signed)
Rx sent 

## 2019-04-29 NOTE — Telephone Encounter (Signed)
Copied from [place] [id number]. Topic: Quick Communication - Rx Refill/Question >> Apr 29, 2019 11:36 AM [name] wrote: Medication: levothyroxine (SYNTHROID, LEVOTHROID) 150 MCG tablet   Has the patient contacted their pharmacy? Yes (Agent: If no, request that the patient contact the pharmacy for the refill.) (Agent: If yes, when and what did the pharmacy advise?)Contact PCP  Preferred Pharmacy (with phone number or street name):[id number] - [place], [place] [phone] (Phone) [phone] (Fax)    Agent: Please be advised that RX refills may take up to 3 business days. We ask that you follow-up with your pharmacy.

## 2019-05-02 ENCOUNTER — Ambulatory Visit (HOSPITAL_COMMUNITY): Payer: Medicare Other

## 2019-05-02 NOTE — Progress Notes (Signed)
Virtual Visit via Video Note  I connected with [name] on 05/03/19 at  8:00 AM EDT by a video enabled telemedicine application and verified that I am speaking with the correct person using two identifiers.   I discussed the limitations of evaluation and management by telemedicine and the availability of in person appointments. The patient expressed understanding and agreed to proceed.  The patient is currently at home and I am in the office.    No referring provider.    History of Present Illness: He is here for follow up of his chronic medical conditions.    He is exercising regularly - walking, yardwork .  He has not gained weight.   CAD, STEMI in 12/2018.  He did cardiac rehab and completed about 50% of what he was supposed to do before it was closed down.    OSA:  He is using a BIPAP machine nightly.   He takes melatonin nightly.   Hypertension: He is taking his medication daily. He is compliant with a low sodium diet.  He denies chest pain, palpitations, edema, shortness of breath and regular headaches.     Prediabetes:  He is compliant with a low sugar/carbohydrate diet.  He is exercising.  Hypothyroidism:  He is taking his medication daily.  He denies any recent changes in energy or weight that are unexplained.   Hyperlipidemia: He is taking his medication daily. He is compliant with a low fat/cholesterol diet. He denies myalgias.     Review of Systems  Constitutional: Negative for chills and fever.  HENT:       Allergy throat clearing  Respiratory: Negative for cough, shortness of breath and wheezing.   Cardiovascular: Negative for chest pain, palpitations and leg swelling.  Gastrointestinal: Negative for blood in stool and melena.  Genitourinary: Negative for hematuria.  Musculoskeletal: Negative for myalgias.  Neurological: Positive for headaches (occ). Negative for dizziness.      Social History   Socioeconomic History  . Marital status: Married   Spouse name: Not on file  . Number of children: Not on file  . Years of education: Not on file  . Highest education level: Not on file  Occupational History  . Occupation: Retired  Scientific laboratory technician  . Financial resource strain: Not hard at all  . Food insecurity:    Worry: Never true    Inability: Never true  . Transportation needs:    Medical: No    Non-medical: No  Tobacco Use  . Smoking status: Never Smoker  . Smokeless tobacco: Never Used  Substance and Sexual Activity  . Alcohol use: Yes    Alcohol/week: 0.0 standard drinks    Comment: rarely, social  . Drug use: No  . Sexual activity: Not on file  Lifestyle  . Physical activity:    Days per week: 5 days    Minutes per session: 30 min  . Stress: Only a little  Relationships  . Social connections:    Talks on phone: Not on file    Gets together: Not on file    Attends religious service: Not on file    Active member of club or organization: Not on file    Attends meetings of clubs or organizations: Not on file    Relationship status: Not on file  Other Topics Concern  . Not on file  Social History Narrative   Working part time, contemplating retirement end of 2016   Lives at home with spouse  Exercise: water exercises     Observations/Objective: Appears well in NAD   Lab Results  Component Value Date   WBC 6.2 12/31/2018   HGB 13.1 12/31/2018   HCT 38.0 (L) 12/31/2018   PLT 148 (L) 12/31/2018   GLUCOSE 117 (H) 12/31/2018   CHOL 80 (L) 02/21/2019   TRIG 49 02/21/2019   HDL 36 (L) 02/21/2019   LDLDIRECT 153.1 06/09/2011   LDLCALC 34 02/21/2019   ALT 88 (H) 02/21/2019   AST 38 02/21/2019   NA 140 12/31/2018   K 3.9 12/31/2018   CL 109 12/31/2018   CREATININE 0.78 12/31/2018   BUN 11 12/31/2018   CO2 22 12/31/2018   TSH 3.809 12/30/2018   PSA 5.72 (H) 12/15/2011   INR 0.94 12/30/2018   HGBA1C 5.4 12/31/2018    Assessment and Plan:  See Problem List for Assessment and Plan of chronic medical  problems.   Follow Up Instructions:    I discussed the assessment and treatment plan with the patient. The patient was provided an opportunity to ask questions and all were answered. The patient agreed with the plan and demonstrated an understanding of the instructions.   The patient was advised to call back or seek an in-person evaluation if the symptoms worsen or if the condition fails to improve as anticipated.  FU in 6 months with labs at that time   Binnie Rail, MD

## 2019-05-03 ENCOUNTER — Encounter: Payer: Self-pay | Admitting: Internal Medicine

## 2019-05-03 ENCOUNTER — Ambulatory Visit (INDEPENDENT_AMBULATORY_CARE_PROVIDER_SITE_OTHER): Payer: Medicare Other | Admitting: Internal Medicine

## 2019-05-03 DIAGNOSIS — E7849 Other hyperlipidemia: Secondary | ICD-10-CM

## 2019-05-03 DIAGNOSIS — I2510 Atherosclerotic heart disease of native coronary artery without angina pectoris: Secondary | ICD-10-CM

## 2019-05-03 DIAGNOSIS — E039 Hypothyroidism, unspecified: Secondary | ICD-10-CM | POA: Diagnosis not present

## 2019-05-03 DIAGNOSIS — R7303 Prediabetes: Secondary | ICD-10-CM | POA: Diagnosis not present

## 2019-05-03 DIAGNOSIS — G4733 Obstructive sleep apnea (adult) (pediatric): Secondary | ICD-10-CM

## 2019-05-03 DIAGNOSIS — I10 Essential (primary) hypertension: Secondary | ICD-10-CM | POA: Diagnosis not present

## 2019-05-03 DIAGNOSIS — Z9861 Coronary angioplasty status: Secondary | ICD-10-CM | POA: Diagnosis not present

## 2019-05-03 MED ORDER — LEVOTHYROXINE SODIUM 150 MCG PO TABS: 150.0000 ug | ORAL_TABLET | Freq: Every day | ORAL | 1 refills | Status: DC

## 2019-05-03 NOTE — Assessment & Plan Note (Signed)
Continue statin - lipids have been well controlled  Lab Results  Component Value Date   CHOL 80 (L) 02/21/2019   HDL 36 (L) 02/21/2019   LDLCALC 34 02/21/2019   TRIG 49 02/21/2019   CHOLHDL 2.2 02/21/2019

## 2019-05-03 NOTE — Assessment & Plan Note (Signed)
Using Bipap nightly 

## 2019-05-03 NOTE — Assessment & Plan Note (Signed)
Lab Results  Component Value Date   HGBA1C 5.4 12/31/2018   Check a1c in 6 months at next appt Low sugar / carb diet Stressed regular exercise

## 2019-05-03 NOTE — Assessment & Plan Note (Signed)
Lab Results  Component Value Date   TSH 3.809 12/30/2018   Continue current dose of medication Recheck tsh in 6 months

## 2019-05-03 NOTE — Assessment & Plan Note (Signed)
BP Readings from Last 3 Encounters:  01/20/19 122/70  01/11/19 124/78  01/01/19 (!) 137/98   BP well controlled Current regimen effective and well tolerated Continue current medications at current doses

## 2019-05-04 ENCOUNTER — Ambulatory Visit (HOSPITAL_COMMUNITY): Payer: Medicare Other

## 2019-05-05 ENCOUNTER — Encounter: Payer: Self-pay | Admitting: Internal Medicine

## 2019-05-06 ENCOUNTER — Ambulatory Visit (HOSPITAL_COMMUNITY): Payer: Medicare Other

## 2019-05-09 ENCOUNTER — Ambulatory Visit (HOSPITAL_COMMUNITY): Payer: Medicare Other

## 2019-05-11 ENCOUNTER — Ambulatory Visit (HOSPITAL_COMMUNITY): Payer: Medicare Other

## 2019-05-13 ENCOUNTER — Ambulatory Visit (HOSPITAL_COMMUNITY): Payer: Medicare Other

## 2019-05-16 ENCOUNTER — Ambulatory Visit (HOSPITAL_COMMUNITY): Payer: Medicare Other

## 2019-05-18 ENCOUNTER — Ambulatory Visit (HOSPITAL_COMMUNITY): Payer: Medicare Other

## 2019-05-24 ENCOUNTER — Telehealth (HOSPITAL_COMMUNITY): Payer: Self-pay

## 2019-05-24 NOTE — Telephone Encounter (Signed)
Phone call made to pt to provide information about virtual cardiac rehab. Pt was responsive and informed that he would like to go forward with virtual rehab. Pt was informed that he would receive a text message to set up application for rehab.

## 2019-05-25 DIAGNOSIS — H35033 Hypertensive retinopathy, bilateral: Secondary | ICD-10-CM | POA: Diagnosis not present

## 2019-05-30 ENCOUNTER — Telehealth (HOSPITAL_COMMUNITY): Payer: Self-pay

## 2019-05-30 NOTE — Telephone Encounter (Signed)
Attempted to contact pt to schedule a telephone visit for our Virtual Cardiac Rehab, LMTCB. °

## 2019-06-13 ENCOUNTER — Telehealth (HOSPITAL_COMMUNITY): Payer: Self-pay | Admitting: *Deleted

## 2019-06-13 NOTE — Telephone Encounter (Signed)
Pt sent please contact letter for virtual cardiac rehab. Pt has until 06/27/19 to respond.  If no response, will discharge pt from the rehab program. Maurice Small RN, BSN Cardiac and Pulmonary Rehab Nurse Navigator

## 2019-06-20 ENCOUNTER — Telehealth (HOSPITAL_COMMUNITY): Payer: Self-pay

## 2019-06-23 ENCOUNTER — Telehealth: Payer: Self-pay

## 2019-06-23 NOTE — Telephone Encounter (Signed)
Called and confirmed patient can do in office appointment with Dr Radford Pax.         COVID-19 Pre-Screening Questions:  . In the past 7 to 10 days have you had a cough,  shortness of breath, headache, congestion, fever (100 or greater) body aches, chills, sore throat, or sudden loss of taste or sense of smell? NO . Have you been around anyone with known Covid 19. NO . Have you been around anyone who is awaiting Covid 19 test results in the past 7 to 10 days? NO . Have you been around anyone who has been exposed to Covid 19, or has mentioned symptoms of Covid 19 within the past 7 to 10 days? NO  If you have any concerns/questions about symptoms patients report during screening (either on the phone or at threshold). Contact the provider seeing the patient or DOD for further guidance.  If neither are available contact a member of the leadership team.

## 2019-06-28 NOTE — Telephone Encounter (Signed)
Attempted to contact pt in regards to signing up for CR, LMTCB.

## 2019-06-29 ENCOUNTER — Telehealth: Payer: Self-pay | Admitting: Cardiology

## 2019-06-29 NOTE — Telephone Encounter (Signed)

## 2019-07-04 ENCOUNTER — Ambulatory Visit: Payer: Medicare Other | Admitting: Cardiology

## 2019-07-06 ENCOUNTER — Telehealth: Payer: Self-pay | Admitting: *Deleted

## 2019-07-06 ENCOUNTER — Telehealth: Payer: Self-pay | Admitting: Cardiology

## 2019-07-06 NOTE — Telephone Encounter (Signed)

## 2019-07-06 NOTE — Telephone Encounter (Signed)
   TELEPHONE CALL NOTE  This patient has been deemed a candidate for follow-up tele-health visit to limit community exposure during the Covid-19 pandemic. I spoke with the patient via phone to discuss instructions. This has been outlined on the patient's AVS (dotphrase: [email or web address]). The patient was advised to review the section on consent for treatment as well. The patient will receive a phone call 2-3 days prior to their E-Visit at which time consent will be verbally confirmed.   A Virtual Office Visit appointment type has been scheduled for 07/07/19 with TT, with "VIDEO" or "TELEPHONE" in the appointment notes - patient prefers VIDEO type.  Currie Paris, [place] 07/06/2019 4:39 PM

## 2019-07-07 ENCOUNTER — Encounter: Payer: Self-pay | Admitting: Cardiology

## 2019-07-07 ENCOUNTER — Telehealth (HOSPITAL_COMMUNITY): Payer: Self-pay

## 2019-07-07 ENCOUNTER — Other Ambulatory Visit: Payer: Self-pay

## 2019-07-07 ENCOUNTER — Telehealth (INDEPENDENT_AMBULATORY_CARE_PROVIDER_SITE_OTHER): Payer: Medicare Other | Admitting: Cardiology

## 2019-07-07 DIAGNOSIS — E7800 Pure hypercholesterolemia, unspecified: Secondary | ICD-10-CM

## 2019-07-07 DIAGNOSIS — I10 Essential (primary) hypertension: Secondary | ICD-10-CM

## 2019-07-07 DIAGNOSIS — G4733 Obstructive sleep apnea (adult) (pediatric): Secondary | ICD-10-CM

## 2019-07-07 DIAGNOSIS — I2510 Atherosclerotic heart disease of native coronary artery without angina pectoris: Secondary | ICD-10-CM

## 2019-07-07 DIAGNOSIS — Z9861 Coronary angioplasty status: Secondary | ICD-10-CM

## 2019-07-07 MED ORDER — ESZOPICLONE 3 MG PO TABS: ORAL_TABLET | ORAL | 3 refills | Status: DC

## 2019-07-07 NOTE — Patient Instructions (Signed)
Medication Instructions:  Lunesta 3 mg, as needed, before bed, 3-4 times a week  If you need a refill on your cardiac medications before your next appointment, please call your pharmacy.   Lab work: None If you have labs (blood work) drawn today and your tests are completely normal, you will receive your results only by: Marland Kitchen MyChart Message (if you have MyChart) OR . A paper copy in the mail If you have any lab test that is abnormal or we need to change your treatment, we will call you to review the results.  Testing/Procedures: None  Follow-Up: At [facility], you and your health needs are our priority.  As part of our continuing mission to provide you with exceptional heart care, we have created designated Provider Care Teams.  These Care Teams include your primary Cardiologist (physician) and Advanced Practice Providers (APPs -  Physician Assistants and Nurse Practitioners) who all work together to provide you with the care you need, when you need it. You will need a follow up appointment in 3 months.  Please call our office 2 months in advance to schedule this appointment.  You may see Dr. Radford Pax or one of the following Advanced Practice Providers on your designated Care Team:   [place], PA-C Melina Copa, PA-C . Ermalinda Barrios, PA-C

## 2019-07-07 NOTE — Progress Notes (Signed)
Virtual Visit via Video Note   This visit type was conducted due to national recommendations for restrictions regarding the COVID-19 Pandemic (e.g. social distancing) in an effort to limit this patient's exposure and mitigate transmission in our community.  Due to his co-morbid illnesses, this patient is at least at moderate risk for complications without adequate follow up.  This format is felt to be most appropriate for this patient at this time.  All issues noted in this document were discussed and addressed.  A limited physical exam was performed with this format.  Please refer to the patient's chart for his consent to telehealth for [facility].   Evaluation Performed:  Follow-up visit  This visit type was conducted due to national recommendations for restrictions regarding the COVID-19 Pandemic (e.g. social distancing).  This format is felt to be most appropriate for this patient at this time.  All issues noted in this document were discussed and addressed.  No physical exam was performed (except for noted visual exam findings with Video Visits).  Please refer to the patient's chart (MyChart message for video visits and phone note for telephone visits) for the patient's consent to telehealth for [facility].  Date:  07/07/2019   ID:  [name], DOB 11/14/1951, MRN [id number]  Patient Location:  Home  Provider location:   [place]  PCP:  Binnie Rail, MD  Cardiologist:  Fransico Him, MD Electrophysiologist:  None   Chief Complaint:  OSA, CAD, HTN, HLD  History of Present Illness:    [name] is a 68 y.o. male who presents via audio/video conferencing for a telehealth visit today.    This is a 68yo male whounderwent a urologic procedure and was noted to have significant apnea during the procedure with O2 desats into the 40's.Sleep study done in Nov 2019 confirmed sleep apnea. The patient had not started C-pap yet. The patient also had a GXT done Oct 2019 which  was negative for ischemia.   He presented to [facility] 12/30/18 after developing SSCP at home. He was at home working in his yard for roughly 1 hr and started to develop non radiating substernal CP. Did not resolve w/ rest nor ASA. He had his wife drive him to the ED. EKG was c/w inferior STEMI. He was taken to the cath lab by Dr Tamala Julian. At cath the patient had a 95% PDA that was felt to be the culprit vessel. This was treated with PCI-DES. Also noted was CTO CFX with collaterals from the PDA on OM1. There was an unsuccessful attempt at opening the CFX. Also has 70% mid LAD lesion, to be treated medically.EF was 60% with inferobasilar HK. Troponin peaked at 3.57. Echo showed EF 65% with no WMA, grade 1 DD. Lipid panel showed LDL at 111 mg/dL.   He was seen back in the office in January by the extender and was doing well.  Since his MI is now on CPAP therapy.  He is here today for followup and is doing well.  He denies any chest pain or pressure, SOB, DOE, PND, orthopnea, LE edema, dizziness, palpitations or syncope. He is compliant with his meds and is tolerating meds with no SE.  He is doing well with his CPAP device and thinks that he has gotten used to it.  He tolerates the mask and feels the pressure is adequate.  He is still having problems with sleeping through the night and wakes up each morning around 4-4:30am which is what  time he used to get up when he was working.    He denies any significant mouth or nasal dryness or nasal congestion.  He does not think that he snores.    The patient does not have symptoms concerning for COVID-19 infection (fever, chills, cough, or new shortness of breath).    Prior CV studies:   The following studies were reviewed today:  PAP compliance download, cardiac cath, 2D echo  Past Medical History:  Diagnosis Date  . Arthritis    Knees  . BPH (benign prostatic hypertrophy)   . COLONIC POLYPS, HX OF 2007   clear colo w/o polyps 04/2015: 68yr follow up  . Coronary  artery disease   . HYPERTENSION   . HYPOTHYROIDISM   . OBESITY   . PAF (paroxysmal atrial fibrillation) ([place]) 10/01/2018   Past Surgical History:  Procedure Laterality Date  . arthroscopic knee surgery     (R) 2003 & (L) 2010  . CARDIAC CATHETERIZATION    . COLONOSCOPY  04/2015   no polyps ([place])  . CORONARY/GRAFT ACUTE MI REVASCULARIZATION N/A 12/30/2018   Procedure: Coronary/Graft Acute MI Revascularization;  Surgeon: Belva Crome, MD;  Location: [place] [facility];  Service: Cardiovascular;  Laterality: N/A;  . LAMINECTOMY  1991  . LEFT HEART CATH AND CORONARY ANGIOGRAPHY N/A 12/30/2018   Procedure: LEFT HEART CATH AND CORONARY ANGIOGRAPHY;  Surgeon: Belva Crome, MD;  Location: [place] [facility];  Service: Cardiovascular;  Laterality: N/A;  . TONSILLECTOMY  1990   w/ adenoids and uvula  . TONSILLECTOMY    . TOTAL HIP ARTHROPLASTY Right 01/2011   [place]  . TOTAL HIP ARTHROPLASTY Left 04/16/2016   Procedure: TOTAL HIP ARTHROPLASTY ANTERIOR APPROACH;  Surgeon: Gaynelle Arabian, MD;  Location: [facility];  Service: Orthopedics;  Laterality: Left;  . TOTAL KNEE ARTHROPLASTY  12/27/2012   Procedure: TOTAL KNEE BILATERAL;  Surgeon: Gearlean Alf, MD;  Location: [facility];  Service: Orthopedics;  Laterality: Bilateral;     Current Meds  Medication Sig  . acetaminophen (TYLENOL) 325 MG tablet Take 2 tablets (650 mg total) by mouth every 4 (four) hours as needed for headache or mild pain.  Marland Kitchen aspirin 81 MG chewable tablet Chew 1 tablet (81 mg total) by mouth daily.  Marland Kitchen atorvastatin (LIPITOR) 80 MG tablet Take 1 tablet (80 mg total) by mouth daily at 6 PM.  . levothyroxine (SYNTHROID) 150 MCG tablet Take 1 tablet (150 mcg total) by mouth daily.  Marland Kitchen losartan (COZAAR) 50 MG tablet Take 1 tablet (50 mg total) by mouth daily.  . metoprolol succinate (TOPROL-XL) 25 MG 24 hr tablet Take 1 tablet (25 mg total) by mouth daily.  . Multiple Vitamins-Minerals (MULTIVITAMIN) tablet Take 1 tablet by mouth  daily.  . nitroGLYCERIN (NITROSTAT) 0.4 MG SL tablet Place 1 tablet (0.4 mg total) under the tongue every 5 (five) minutes as needed for chest pain.  . ticagrelor (BRILINTA) 90 MG TABS tablet Take 1 tablet (90 mg total) by mouth 2 (two) times daily.     Allergies:   Patient has no known allergies.   Social History   Tobacco Use  . Smoking status: Never Smoker  . Smokeless tobacco: Never Used  Substance Use Topics  . Alcohol use: Yes    Alcohol/week: 0.0 standard drinks    Comment: rarely, social  . Drug use: No     Family Hx: The patient's family history includes ALS in his maternal grandfather; Arthritis in an other family member;  Dementia in his mother; Diabetes in his father; Heart disease (age of onset: 36) in his father; Hypertension in his father. There is no history of Colon cancer.  ROS:   Please see the history of present illness.     All other systems reviewed and are negative.   Labs/Other Tests and Data Reviewed:    Recent Labs: 12/30/2018: TSH 3.809 12/31/2018: BUN 11; Creatinine, Ser 0.78; Hemoglobin 13.1; Platelets 148; Potassium 3.9; Sodium 140 02/21/2019: ALT 88   Recent Lipid Panel Lab Results  Component Value Date/Time   CHOL 80 (L) 02/21/2019 10:02 AM   TRIG 49 02/21/2019 10:02 AM   HDL 36 (L) 02/21/2019 10:02 AM   CHOLHDL 2.2 02/21/2019 10:02 AM   CHOLHDL 4.3 12/30/2018 09:37 PM   LDLCALC 34 02/21/2019 10:02 AM   LDLDIRECT 153.1 06/09/2011 09:42 AM    Wt Readings from Last 3 Encounters:  07/07/19 258 lb (117 kg)  02/19/19 282 lb (127.9 kg)  01/27/19 285 lb (129.3 kg)     Objective:    Vital Signs:  BP (!) 147/75   Pulse (!) 59   Ht 6' (1.829 m)   Wt 258 lb (117 kg)   BMI 34.99 kg/m    CONSTITUTIONAL:  Well nourished, well developed male in no acute distress.  EYES: anicteric MOUTH: oral mucosa is pink RESPIRATORY: Normal respiratory effort, symmetric expansion CARDIOVASCULAR: No peripheral edema SKIN: No rash, lesions or ulcers  MUSCULOSKELETAL: no digital cyanosis NEURO: Cranial Nerves II-XII grossly intact, moves all extremities PSYCH: Intact judgement and insight.  A&O x 3, Mood/affect appropriate   ASSESSMENT & PLAN:    1.  ASCAD - s/p recent STEMI 12/30/18 s/p status post PCI of PDA. Also residual disease - CTO CFX with collaterals from the PDA on OM1. There was an unsuccessful attempt at opening the CFX. Also 70% mid LAD.Plan to treat medically. He has not had any anginal sx since his PCI.  He will continue on DAPT with ASA and Brilinta 90mg  BID for a minimum of 12 months.  He will continue on Toprol XL 25mg  daily and high dose statin.   2.  Hyperlipidemia - his LDL goal is < 70.  His last LDL was 34 in Feb 2020. He will continue on high intensity statin with Lipitor 80mg  daily.    3.  OSA - the patient is tolerating PAP therapy well without any problems. I will get a download from his DME.  The patient has been using and benefiting from PAP use and will continue to benefit from therapy. He is having problems with insomnia which I suspect is due to circadian rhythm issues.  He used to get up at 4-4:30am every morning for work and this is likely going to be difficult to change.  He is taking melatonin.  I have recommended trying Lunesta 3mg  qhs to take only 3 or 4 times weekly to help him get a full nights rest.  He will let me know how it works.   4.  Obesity - He has lost 25lbs since Jan 2020. He is going to start back up in cardiac rehab next week and get back in an exercise program.   5.  Hypertension - His BP is borderline controlled.  I would like to see his BP < 130/47mmHg. He has been salting his food some.  I have instructed him to follow a 2gm Na diet.   I have asked him to check his BP daily for a week around lunch  and call with results after starting rehab.  If BP remains elevated we will need to increase Losartan.   COVID-19 Education: The signs and symptoms of COVID-19 were discussed with the patient and  how to seek care for testing (follow up with PCP or arrange E-visit).  The importance of social distancing was discussed today.  Patient Risk:   After full review of this patient's clinical status, I feel that they are at least moderate risk at this time.  Time:   Today, I have spent 20 minutes directly with the patient on telemedicine video discussing medical problems including CAD, HTN, HLD, OSA.  We also reviewed the symptoms of COVID 19 and the ways to protect against contracting the virus with telehealth technology.  I spent an additional 5 minutes reviewing patient's chart including cath, 2D echo and labs.  Medication Adjustments/Labs and Tests Ordered: Current medicines are reviewed at length with the patient today.  Concerns regarding medicines are outlined above.  Tests Ordered: No orders of the defined types were placed in this encounter.  Medication Changes: No orders of the defined types were placed in this encounter.   Disposition:  Follow up in 3 month(s)  Signed, Fransico Him, MD  07/07/2019 12:04 PM    [place]

## 2019-07-11 ENCOUNTER — Telehealth: Payer: Self-pay | Admitting: *Deleted

## 2019-07-11 NOTE — Telephone Encounter (Signed)
-----   Message from Sueanne Margarita, MD sent at 07/08/2019  1:44 PM EDT ----- AHI too high - please encouraged him to avoid sleeping supine and also needs to see DME about his mask issues. Repeat download I 4 weeks

## 2019-07-11 NOTE — Telephone Encounter (Signed)
Patient is aware and agreeable to AHI being elevated at 15.7. Encouraged patient to avoid sleeping on his back and to see his DME about his mask issues. I will get a download in 4 weeks.

## 2019-07-12 ENCOUNTER — Telehealth: Payer: Self-pay

## 2019-07-12 NOTE — Telephone Encounter (Signed)
Please find out if he has ever tried [employer]

## 2019-07-12 NOTE — Telephone Encounter (Signed)
We received a PA form for Eszopiclone (Lunesta) from [facility] stating that Eszopiclone is not on the pts plan of covered medications.  Formulary alternatives are:  Tamazepam 7.5 mg and 15 mg Zolpidem Tartrate Belsomra Hetlioz Doxepin  Will forward this message to Dr Radford Pax and her nurse for advisement to the pt.

## 2019-07-14 ENCOUNTER — Telehealth: Payer: Self-pay | Admitting: *Deleted

## 2019-07-14 MED ORDER — ZOLPIDEM TARTRATE 5 MG PO TABS: 5.0000 mg | ORAL_TABLET | Freq: Every evening | ORAL | 3 refills | Status: DC | PRN

## 2019-07-14 NOTE — Telephone Encounter (Signed)
Spoke with the patient, he has never taken Ambien. He did pay for the prescription of Lunesta, he said it has helped some. He wanted to try Ambien instead.

## 2019-07-14 NOTE — Telephone Encounter (Signed)
-----   Message from Sueanne Margarita, MD sent at 07/08/2019  1:44 PM EDT ----- AHI too high - please encouraged him to avoid sleeping supine and also needs to see DME about his mask issues. Repeat download I 4 weeks

## 2019-07-14 NOTE — Telephone Encounter (Signed)
Informed patient of compliance results and verbalized understanding was indicated. Patient is aware and agreeable to AHI being elevated at 15.7. Patient has been encouraged to avoid sleeping on his back and to see [facility] about his mask issues. I will pull a d/l in 4 weeks.

## 2019-07-14 NOTE — Telephone Encounter (Signed)
Called in the prescription and cancelled Lunesta. The patient expressed understanding about how to take and not to take Lunesta and Ambien together.

## 2019-07-14 NOTE — Telephone Encounter (Signed)
Order Ambien 5 mg take 1 tablet PO qhs PRN for insomnia.  Do not take Ambien with Johnnye Sima

## 2019-07-18 ENCOUNTER — Other Ambulatory Visit: Payer: Self-pay

## 2019-07-18 ENCOUNTER — Encounter (HOSPITAL_COMMUNITY)
Admission: RE | Admit: 2019-07-18 | Discharge: 2019-07-18 | Disposition: A | Discharge status: Home / Self Care | Payer: Medicare Other | Source: Ambulatory Visit | Attending: Cardiology | Admitting: Cardiology

## 2019-07-18 DIAGNOSIS — Z955 Presence of coronary angioplasty implant and graft: Secondary | ICD-10-CM | POA: Insufficient documentation

## 2019-07-18 DIAGNOSIS — I2119 ST elevation (STEMI) myocardial infarction involving other coronary artery of inferior wall: Secondary | ICD-10-CM | POA: Diagnosis not present

## 2019-07-18 NOTE — Progress Notes (Signed)
Daily Session Note  Patient Details  Name: [name] MRN: [id number] Date of Birth: 18-Feb-1951 Referring Provider:     CARDIAC REHAB PHASE II ORIENTATION from 01/20/2019 in [place]  Referring Provider  Dr. Radford Pax      Encounter Date: 07/18/2019  Check In: Session Check In - 07/18/19 0927      Check-In   Supervising physician immediately available to respond to emergencies  [facility] immediately available    Physician(s)  Dr. Earnest Conroy    Location  [facility]    Staff Present  Dorma Russell, MS,ACSM CEP, Exercise Physiologist;Joann Rion, RN, Bjorn Loser, MS, Exercise Physiologist;Brittany Durene Fruits, BS, ACSM CEP, Exercise Physiologist;Joan Leonia Reeves, RN, Deland Pretty, MS, ACSM CEP, Exercise Physiologist    Virtual Visit  No    Medication changes reported      No    Fall or balance concerns reported     No    Tobacco Cessation  No Change    Warm-up and Cool-down  Performed on first and last piece of equipment    Resistance Training Performed  Yes    VAD Patient?  No    PAD/SET Patient?  No      Pain Assessment   Currently in Pain?  No/denies    Pain Score  0-No pain    Multiple Pain Sites  No       Capillary Blood Glucose: No results found for this or any previous visit (from the past 24 hour(s)).  Exercise Prescription Changes - 07/18/19 1000      Response to Exercise   Blood Pressure (Admit)  130/62    Blood Pressure (Exercise)  144/82    Blood Pressure (Exit)  118/68    Heart Rate (Admit)  80 bpm    Heart Rate (Exercise)  94 bpm    Heart Rate (Exit)  72 bpm    Rating of Perceived Exertion (Exercise)  12    Perceived Dyspnea (Exercise)  0    Symptoms  None    Comments  First day back to cardiac rehab since departmental closure due to COVID-19 pandemic.    Duration  Progress to 30 minutes of  aerobic without signs/symptoms of physical distress    Intensity  THRR unchanged      Progression   Progression  Continue to progress workloads to maintain intensity without signs/symptoms of physical distress.    Average METs  3.8      Resistance Training   Training Prescription  Yes    Weight  5lbs    Reps  10-15    Time  10 Minutes      NuStep   Level  7    SPM  85    Minutes  10    METs  3.8      Arm Ergometer   Level  5    Minutes  10      Home Exercise Plan   Plans to continue exercise at  Home (comment)   Pt is swimming 3-4 days/week and walking.   Frequency  Add 3 additional days to program exercise sessions.    Initial Home Exercises Provided  02/04/19       Social History   Tobacco Use  Smoking Status Never Smoker  Smokeless Tobacco Never Used    Goals Met:  Exercise tolerated well Personal goals reviewed  Goals Unmet:  Not Applicable  Comments: [name] returned to exercise today after the department closure due  to the Covid 19 pandemic. Patient tolerated exercise without difficulty. [name] says he has been swimming walking and doing yard work since [employer] reopened. Will continue to monitor the patient throughout  the program. [name] Pall, RN,BSN 07/18/2019 10:59 AM  Dr. Fransico Him is Medical Director for Cardiac Rehab at [facility]

## 2019-07-20 ENCOUNTER — Encounter (HOSPITAL_COMMUNITY)
Admission: RE | Admit: 2019-07-20 | Discharge: 2019-07-20 | Disposition: A | Discharge status: Home / Self Care | Payer: Medicare Other | Source: Ambulatory Visit | Attending: Cardiology | Admitting: Cardiology

## 2019-07-20 ENCOUNTER — Other Ambulatory Visit: Payer: Self-pay

## 2019-07-20 DIAGNOSIS — Z955 Presence of coronary angioplasty implant and graft: Secondary | ICD-10-CM

## 2019-07-20 DIAGNOSIS — I2119 ST elevation (STEMI) myocardial infarction involving other coronary artery of inferior wall: Secondary | ICD-10-CM | POA: Diagnosis not present

## 2019-07-21 NOTE — Progress Notes (Signed)
Cardiac Individual Treatment Plan  Patient Details  Name: [name] MRN: [id number] Date of Birth: 1951-12-06 Referring Provider:     Roberta from 01/20/2019 in [place]  Referring Provider  Dr. Radford Pax      Initial Encounter Date:    CARDIAC REHAB PHASE II ORIENTATION from 01/20/2019 in [facility]  Date  01/20/19      Visit Diagnosis: Acute ST elevation myocardial infarction (STEMI) of inferior wall ([place]), 12/30/18  Status post coronary artery stent placement,DES RPDA 12/30/18  Patient's Home Medications on Admission:  Current Outpatient Medications:  .  acetaminophen (TYLENOL) 325 MG tablet, Take 2 tablets (650 mg total) by mouth every 4 (four) hours as needed for headache or mild pain., Disp: , Rfl:  .  aspirin 81 MG chewable tablet, Chew 1 tablet (81 mg total) by mouth daily., Disp: , Rfl:  .  atorvastatin (LIPITOR) 80 MG tablet, Take 1 tablet (80 mg total) by mouth daily at 6 PM., Disp: 90 tablet, Rfl: 3 .  levothyroxine (SYNTHROID) 150 MCG tablet, Take 1 tablet (150 mcg total) by mouth daily., Disp: 90 tablet, Rfl: 1 .  losartan (COZAAR) 50 MG tablet, Take 1 tablet (50 mg total) by mouth daily., Disp: 90 tablet, Rfl: 3 .  metoprolol succinate (TOPROL-XL) 25 MG 24 hr tablet, Take 1 tablet (25 mg total) by mouth daily., Disp: 90 tablet, Rfl: 3 .  Multiple Vitamins-Minerals (MULTIVITAMIN) tablet, Take 1 tablet by mouth daily., Disp: , Rfl:  .  nitroGLYCERIN (NITROSTAT) 0.4 MG SL tablet, Place 1 tablet (0.4 mg total) under the tongue every 5 (five) minutes as needed for chest pain., Disp: 25 tablet, Rfl: 5 .  ticagrelor (BRILINTA) 90 MG TABS tablet, Take 1 tablet (90 mg total) by mouth 2 (two) times daily., Disp: 180 tablet, Rfl: 3 .  zolpidem (AMBIEN) 5 MG tablet, Take 1 tablet (5 mg total) by mouth at bedtime as needed for sleep (For [place])., Disp: 20 tablet, Rfl: 3  Past Medical  History: Past Medical History:  Diagnosis Date  . Arthritis    Knees  . BPH (benign prostatic hypertrophy)   . COLONIC POLYPS, HX OF 2007   clear colo w/o polyps 04/2015: 48yrfollow up  . Coronary artery disease   . HYPERTENSION   . HYPOTHYROIDISM   . OBESITY   . [place]    Tobacco Use: Social History   Tobacco Use  Smoking Status Never Smoker  Smokeless Tobacco Never Used    Labs: Recent Review Flowsheet Data    Labs for ITP Cardiac and Pulmonary Rehab Latest Ref Rng & Units 12/30/2018 12/30/2018 12/30/2018 12/31/2018 02/21/2019   Cholestrol 100 - 199 mg/dL 198 - 167 - 80(L)   LDLCALC 0 - 99 mg/dL 93 - 111(H) - 34   LDLDIRECT mg/dL - - - - -   HDL >39 mg/dL 41 - 39(L) - 36(L)   Trlycerides 0 - 149 mg/dL 322(H) - 83 - 49   Hemoglobin A1c 4.8 - 5.6 % - - - 5.4 -   TCO2 22 - 32 mmol/L - 24 - - -      Capillary Blood Glucose: No results found for: GLUCAP   Exercise Target Goals: Exercise Program Goal: Individual exercise prescription set using results from initial 6 min walk test and THRR while considering  patient's activity barriers and safety.   Exercise Prescription Goal: Starting with aerobic activity  30 plus minutes a day, 3 days per week for initial exercise prescription. Provide home exercise prescription and guidelines that participant acknowledges understanding prior to discharge.  Activity Barriers & Risk Stratification:   6 Minute Walk:   Oxygen Initial Assessment:   Oxygen Re-Evaluation:   Oxygen Discharge (Final Oxygen Re-Evaluation):   Initial Exercise Prescription:   Perform Capillary Blood Glucose checks as needed.  Exercise Prescription Changes: Exercise Prescription Changes    Row Name 01/24/19 0900 01/31/19 1000 02/04/19 0900 02/22/19 1300 03/09/19 0928     Response to Exercise   Blood Pressure (Admit)  128/70  120/82  124/72  122/72  130/78   Blood Pressure (Exercise)  140/70  130/68  144/80   132/78  138/84   Blood Pressure (Exit)  132/80  120/72  140/64  118/72  130/70   Heart Rate (Admit)  85 bpm  68 bpm  70 bpm  62 bpm  63 bpm   Heart Rate (Exercise)  107 bpm  122 bpm  105 bpm  108 bpm  111 bpm   Heart Rate (Exit)  84 bpm  75 bpm  74 bpm  71 bpm  68 bpm   Rating of Perceived Exertion (Exercise)  _0 Perceived Dyspnea (Exercise)  0  0  0  0  0   Symptoms  None  None  None  None  None   Comments  Pt's first day of exercise   -  -  -  -   Duration  Progress to 30 minutes of  aerobic without signs/symptoms of physical distress  Progress to 30 minutes of  aerobic without signs/symptoms of physical distress  Progress to 30 minutes of  aerobic without signs/symptoms of physical distress  Progress to 30 minutes of  aerobic without signs/symptoms of physical distress  Progress to 30 minutes of  aerobic without signs/symptoms of physical distress   Intensity  THRR unchanged  THRR unchanged  THRR unchanged  THRR unchanged  THRR unchanged     Progression   Progression  Continue to progress workloads to maintain intensity without signs/symptoms of physical distress.  Continue to progress workloads to maintain intensity without signs/symptoms of physical distress.  Continue to progress workloads to maintain intensity without signs/symptoms of physical distress.  Continue to progress workloads to maintain intensity without signs/symptoms of physical distress.  Continue to progress workloads to maintain intensity without signs/symptoms of physical distress.   Average METs  2.83  4.8  3.6  5.5  6.5     Resistance Training   Training Prescription  Yes  Yes  Yes  Yes  No   Weight  4lbs  4lbs  6 lbs.   6 lbs.   -   Reps  10-15  10-15  10-15  10-15  -   Time  10 Minutes  10 Minutes  10 Minutes  10 Minutes  -     Treadmill   MPH  2.5  2.6  -  -  -   Grade  10  2  -  -  -   Minutes  10  10  -  -  -   METs  3.26  3.71  -  -  -     NuStep   Level  _1 SPM  85  85  85   85  85   Minutes  20  _0 METs  2.4  4  3.2  4.4  5     Arm Ergometer   Level  -  -  _1 Watts  -  -  _2 Minutes  -  -  _3 METs  -  -  4  6.6  7.9     Elliptical   Level  -  1  -  -  -   Speed  -  1  -  -  -   Minutes  -  10  -  -  -   METs  -  6.7  -  -  -     Home Exercise Plan   Plans to continue exercise at  -  -  Home (comment)  Home (comment)  Home (comment)   Frequency  -  -  Add 3 additional days to program exercise sessions.  Add 3 additional days to program exercise sessions.  Add 3 additional days to program exercise sessions.   Initial Home Exercises Provided  -  -  02/04/19  02/04/19  02/04/19   Row Name 07/18/19 1000             Response to Exercise   Blood Pressure (Admit)  130/62       Blood Pressure (Exercise)  144/82       Blood Pressure (Exit)  118/68       Heart Rate (Admit)  80 bpm       Heart Rate (Exercise)  94 bpm       Heart Rate (Exit)  72 bpm       Rating of Perceived Exertion (Exercise)  12       Perceived Dyspnea (Exercise)  0       Symptoms  None       Comments  First day back to cardiac rehab since departmental closure due to COVID-19 pandemic.       Duration  Progress to 30 minutes of  aerobic without signs/symptoms of physical distress       Intensity  THRR unchanged         Progression   Progression  Continue to progress workloads to maintain intensity without signs/symptoms of physical distress.       Average METs  3.8         Resistance Training   Training Prescription  Yes       Weight  5lbs       Reps  10-15       Time  10 Minutes         NuStep   Level  7       SPM  85       Minutes  15       METs  3.8         Arm Ergometer   Level  5       Minutes  15         Home Exercise Plan   Plans to continue exercise at  Home (comment) Pt is swimming 3-4 days/week and walking.       Frequency  Add 3 additional days to program exercise sessions.       Initial Home Exercises Provided   02/04/19          Exercise Comments: Exercise Comments    Row  Name 01/24/19 0940 02/02/19 0959 02/04/19 1001 03/01/19 1419 03/17/19 0930   Exercise Comments  Pt's first day of exercise. Pt oriented to exercise equipment. Pt reported knee pain with [employer] bike. Will remove from exercise prescription. Will continue to monitor pt and progress as tolerated.   Reviewed METs and goals with Pt. Pt is tolerating exercise Rx well.   Reviewed HEP with Pt. Pt was responsive and understands goals.   Reviewed METs and goals with Pt. Pt is tolerating exercise well.   Unable to review METs and goals with Pt due to closure of Cardiac Rehab for [place].    [place] Name 07/18/19 1039           Exercise Comments  Patient resumed exercise in the cardiac rehab program and tolerated exercise well. Reviewed goals with patient.          Exercise Goals and Review:   Exercise Goals Re-Evaluation : Exercise Goals Re-Evaluation    Row Name 02/02/19 4920 02/04/19 0954 03/01/19 1417 03/17/19 0930 07/18/19 1035     Exercise Goal Re-Evaluation   Exercise Goals Review  Increase Physical Activity;Increase Strength and Stamina;Able to understand and use rate of perceived exertion (RPE) scale;Knowledge and understanding of Target Heart Rate Range (THRR);Understanding of Exercise Prescription  Increase Physical Activity;Increase Strength and Stamina;Able to understand and use rate of perceived exertion (RPE) scale;Knowledge and understanding of Target Heart Rate Range (THRR);Understanding of Exercise Prescription  Increase Physical Activity;Increase Strength and Stamina;Able to understand and use rate of perceived exertion (RPE) scale;Knowledge and understanding of Target Heart Rate Range (THRR);Understanding of Exercise Prescription  Increase Physical Activity;Increase Strength and Stamina;Able to understand and use rate of perceived exertion (RPE) scale;Knowledge and understanding of Target Heart Rate Range (THRR);Understanding  of Exercise Prescription  Increase Physical Activity;Increase Strength and Stamina;Able to understand and use rate of perceived exertion (RPE) scale;Knowledge and understanding of Target Heart Rate Range (THRR);Understanding of Exercise Prescription;Able to check pulse independently   Comments  Reviewed METs and goals with Pt. Pt is new to the program and has a MET level of 4.8. Tolerates exercise Rx well and improves with each session. Pt is going to a local gym 2-4 days per week and exercising 30-50 minutes in addition to the program.   Reviewed HEP with Pt. Pt understands home exercise goals, NTG use, exercise precautions, weather precautions, and enpoints of exericse. Pt is exercising at home and a loacal gym 2-4 days per week for 30-45 minutes in addition to Cardiac Rehab.   Reviewed METs and goals with Pt. Pt MET level is 5.5. Pt is tolerating exercise well and increasing workloads gradually. Pt is exercising at a local gym in the pool 5-7 days a week for 30-45 minutes in addition to Cardiac Rehab.   Unable to review METs and goals with Pt due to closure of Cardiac Rehab for [place]. Pt MET level is 6.5 and is progressing well. Pt is tolerating exercise Rx well.   Pt's first session of exercise at cardiac rehab since departmental closure due to COVID-19 pandemic. Pt tolerated exercise well. Reviewed target heart rate range with patient, and pt has a HR monitor to check his pulse. Patient goes to the pool for ~an hour and walks 2 laps and swims 1 lap as his mode of home exercise. Pt also states that he averages ~8,000-9,000 steps/day.   Expected Outcomes  Will continue to monitor and progress Pt as tolerated.   Will continue to monitor and progress Pt as tolerated.  Will continue to monitor and progress Pt as tolerated.   Will continue to monitor and progress Pt as tolerated.   Increase workloads as tolerated to help improve fitness.       Discharge Exercise Prescription (Final Exercise Prescription  Changes): Exercise Prescription Changes - 07/18/19 1000      Response to Exercise   Blood Pressure (Admit)  130/62    Blood Pressure (Exercise)  144/82    Blood Pressure (Exit)  118/68    Heart Rate (Admit)  80 bpm    Heart Rate (Exercise)  94 bpm    Heart Rate (Exit)  72 bpm    Rating of Perceived Exertion (Exercise)  12    Perceived Dyspnea (Exercise)  0    Symptoms  None    Comments  First day back to cardiac rehab since departmental closure due to COVID-19 pandemic.    Duration  Progress to 30 minutes of  aerobic without signs/symptoms of physical distress    Intensity  THRR unchanged      Progression   Progression  Continue to progress workloads to maintain intensity without signs/symptoms of physical distress.    Average METs  3.8      Resistance Training   Training Prescription  Yes    Weight  5lbs    Reps  10-15    Time  10 Minutes      NuStep   Level  7    SPM  85    Minutes  15    METs  3.8      Arm Ergometer   Level  5    Minutes  15      Home Exercise Plan   Plans to continue exercise at  Home (comment)   Pt is swimming 3-4 days/week and walking.   Frequency  Add 3 additional days to program exercise sessions.    Initial Home Exercises Provided  02/04/19       Nutrition:  Target Goals: Understanding of nutrition guidelines, daily intake of sodium <151m, cholesterol <2062m calories 30% from fat and 7% or less from saturated fats, daily to have 5 or more servings of fruits and vegetables.  Biometrics:    Nutrition Therapy Plan and Nutrition Goals: Nutrition Therapy & Goals - 01/31/19 0915      Nutrition Therapy   Diet  heart healthy, carb modified      Personal Nutrition Goals   Nutrition Goal  Pt to identify and limit food sources of saturated fat, trans fat, refined carbohydrates and sodium    Personal Goal #2  Pt to identify food quantities necessary to achieve weight loss of 6-24 lb at graduation from cardiac rehab.     Personal Goal #3   Pt to build a healthy plate including vegetables, fruits, whole grains, lean protein and low-fat dairy products in a heart healthy meal plan.    Personal Goal #4  Pt to weigh and measure serving sizes      Intervention Plan   Intervention  Prescribe, educate and counsel regarding individualized specific dietary modifications aiming towards targeted core components such as weight, hypertension, lipid management, diabetes, heart failure and other comorbidities.    Expected Outcomes  Short Term Goal: Understand basic principles of dietary content, such as calories, fat, sodium, cholesterol and nutrients.;Long Term Goal: Adherence to prescribed nutrition plan.       Nutrition Assessments:   Nutrition Goals Re-Evaluation: Nutrition Goals Re-Evaluation    Row Name 01/31/19 [id number]  Goals   Current Weight  278 lb 7.1 oz (126.3 kg)          Nutrition Goals Discharge (Final Nutrition Goals Re-Evaluation): Nutrition Goals Re-Evaluation - 01/31/19 0915      Goals   Current Weight  278 lb 7.1 oz (126.3 kg)       Psychosocial: Target Goals: Acknowledge presence or absence of significant depression and/or stress, maximize coping skills, provide positive support system. Participant is able to verbalize types and ability to use techniques and skills needed for reducing stress and depression.  Initial Review & Psychosocial Screening:   Quality of Life Scores:  Scores of 19 and below usually indicate a poorer quality of life in these areas.  A difference of  2-3 points is a clinically meaningful difference.  A difference of 2-3 points in the total score of the Quality of Life Index has been associated with significant improvement in overall quality of life, self-image, physical symptoms, and general health in studies assessing change in quality of life.  PHQ-9: Recent Review Flowsheet Data    Depression screen [facility] 2/9 07/18/2019 01/24/2019 08/19/2018 05/12/2018 03/30/2014   Decreased  Interest 0 0 0 0 0   Down, Depressed, Hopeless 0 0 0 0 0   PHQ - 2 Score 0 0 0 0 0   Altered sleeping - - - 2 -   Tired, decreased energy - - - 1 -   Change in appetite - - - 0 -   Feeling bad or failure about yourself  - - - 0 -   Trouble concentrating - - - 0 -   Moving slowly or fidgety/restless - - - 0 -   Suicidal thoughts - - - 0 -   PHQ-9 Score - - - 3 -   Difficult doing work/chores - - - Not difficult at all -     Interpretation of Total Score  Total Score Depression Severity:  1-4 = Minimal depression, 5-9 = Mild depression, 10-14 = Moderate depression, 15-19 = Moderately severe depression, 20-27 = Severe depression   Psychosocial Evaluation and Intervention: Psychosocial Evaluation - 01/24/19 1536      Psychosocial Evaluation & Interventions   Interventions  Encouraged to exercise with the program and follow exercise prescription    Comments  no psychosocial needs identified, no interventions necessary.  pt enjoys spending time with grandchildren and garden.      Expected Outcomes  pt will exhibit positive outlook with good coping skills.     Continue Psychosocial Services   No Follow up required       Psychosocial Re-Evaluation: Psychosocial Re-Evaluation    [place] Name 02/02/19 1121 03/01/19 1337 03/22/19 1536 07/21/19 1508       Psychosocial Re-Evaluation   Current issues with  None Identified  None Identified  None Identified  None Identified    Comments  no psychosocial needs identified, no interventions necessary   no psychosocial needs identified, no interventions necessary   no psychosocial needs identified, no interventions necessary   no psychosocial needs identified, no interventions necessary     Expected Outcomes  pt will exhibit positive outlook with good coping skills.   pt will exhibit positive outlook with good coping skills.   pt will exhibit positive outlook with good coping skills.   pt will exhibit positive outlook with good coping skills.      Interventions  Encouraged to attend Cardiac Rehabilitation for the exercise  Encouraged to attend Cardiac Rehabilitation for the exercise  Encouraged to attend Cardiac Rehabilitation for the exercise  -    Continue Psychosocial Services   No Follow up required  No Follow up required  No Follow up required  No Follow up required       Psychosocial Discharge (Final Psychosocial Re-Evaluation): Psychosocial Re-Evaluation - 07/21/19 1508      Psychosocial Re-Evaluation   Current issues with  None Identified    Comments  no psychosocial needs identified, no interventions necessary     Expected Outcomes  pt will exhibit positive outlook with good coping skills.     Continue Psychosocial Services   No Follow up required       Vocational Rehabilitation: Provide vocational rehab assistance to qualifying candidates.   Vocational Rehab Evaluation & Intervention:   Education: Education Goals: Education classes will be provided on a weekly basis, covering required topics. Participant will state understanding/return demonstration of topics presented.  Learning Barriers/Preferences:   Education Topics: Hypertension, Hypertension Reduction -Define heart disease and high blood pressure. Discus how high blood pressure affects the body and ways to reduce high blood pressure.   Exercise and Your Heart -Discuss why it is important to exercise, the FITT principles of exercise, normal and abnormal responses to exercise, and how to exercise safely.   Angina -Discuss definition of angina, causes of angina, treatment of angina, and how to decrease risk of having angina.   Cardiac Medications -Review what the following cardiac medications are used for, how they affect the body, and side effects that may occur when taking the medications.  Medications include Aspirin, Beta blockers, calcium channel blockers, ACE Inhibitors, angiotensin receptor blockers, diuretics, digoxin, and  antihyperlipidemics.   Congestive Heart Failure -Discuss the definition of CHF, how to live with CHF, the signs and symptoms of CHF, and how keep track of weight and sodium intake.   Heart Disease and Intimacy -Discus the effect sexual activity has on the heart, how changes occur during intimacy as we age, and safety during sexual activity.   Smoking Cessation / COPD -Discuss different methods to quit smoking, the health benefits of quitting smoking, and the definition of COPD.   Nutrition I: Fats -Discuss the types of cholesterol, what cholesterol does to the heart, and how cholesterol levels can be controlled.   Nutrition II: Labels -Discuss the different components of food labels and how to read food label   Heart Parts/Heart Disease and PAD -Discuss the anatomy of the heart, the pathway of blood circulation through the heart, and these are affected by heart disease.   Stress I: Signs and Symptoms -Discuss the causes of stress, how stress may lead to anxiety and depression, and ways to limit stress.   Stress II: Relaxation -Discuss different types of relaxation techniques to limit stress.   Warning Signs of Stroke / TIA -Discuss definition of a stroke, what the signs and symptoms are of a stroke, and how to identify when someone is having stroke.   Knowledge Questionnaire Score:   Core Components/Risk Factors/Patient Goals at Admission:   Core Components/Risk Factors/Patient Goals Review:  Goals and Risk Factor Review    Row Name 01/24/19 1538 02/02/19 1121 03/01/19 1337 03/22/19 1537 07/21/19 1508     Core Components/Risk Factors/Patient Goals Review   Personal Goals Review  Weight Management/Obesity;Hypertension;Lipids;Stress  -  Weight Management/Obesity;Hypertension;Lipids;Stress  Weight Management/Obesity;Hypertension;Lipids;Stress  Weight Management/Obesity;Hypertension;Lipids;Stress   Review  pt with multiple CAD RF demonstrates eagerness to participate in  CR opportunities. pt personal goals are "peace of mind" with physical  activity and safely resume community gym activiites of swimming and treadmiall.   pt with multiple CAD RF demonstrates eagerness to participate in CR opportunities. pt personal goals are "peace of mind" with physical activity and safely resume community gym activiites of swimming and treadmiall. pt rerpots using eliptical (3.0, 1.4 mile)l at [facility] on off days from CR. pt activity limited by hip pain.    pt with multiple CAD RF demonstrates eagerness to participate in CR opportunities. pt personal goals are "peace of mind" with physical activity and safely resume community gym activiites of swimming and treadmill. pt rerpots using eliptical (3.0, 1.4 mile)l at the [facility] on off days from CR. pt activity limited by hip pain.    pt with multiple CAD RF demonstrates eagerness to participate in CR opportunities. pt personal goals are "peace of mind" with physical activity and safely resume community gym activiites of swimming and treadmill. pt rerpots using eliptical (3.0, 1.4 mile)l at the [facility] on off days from CR. pt activity limited by hip pain.    [name] has lost weight since the department closure. [name] is doing well with exercise. Vital signs have been stable.   Expected Outcomes  pt will participate in CR exercise, nutrition and lifestyle modification to decrease overall RF.   pt will participate in CR exercise, nutrition and lifestyle modification to decrease overall RF.   pt will participate in CR exercise, nutrition and lifestyle modification to decrease overall RF.   pt will participate in CR exercise, nutrition and lifestyle modification to decrease overall RF.   pt will participate in CR exercise, nutrition and lifestyle modification to decrease overall RF.       Core Components/Risk Factors/Patient Goals at Discharge (Final Review):  Goals and Risk Factor Review - 07/21/19 1508      Core Components/Risk Factors/Patient Goals  Review   Personal Goals Review  Weight Management/Obesity;Hypertension;Lipids;Stress    Review  [name] has lost weight since the department closure. [name] is doing well with exercise. Vital signs have been stable.    Expected Outcomes  pt will participate in CR exercise, nutrition and lifestyle modification to decrease overall RF.        ITP Comments: ITP Comments    Row Name 01/24/19 1535 02/02/19 1120 03/01/19 1336 03/22/19 1535 07/21/19 1506   ITP Comments  pt started group exercise.  pt tolerated light activity without difficulty. pt oriented to exercise equipment and safety routine. understanding verbalized.  30 day ITP review. pt with good attendance and participation. pt limited by hip pain.   30 day ITP review. pt with good attendance and participation. pt limited by hip pain.   30 day ITP review. pt currently on hold for CR dept closure as part of  COVID 19 precautions.   30 Day ITP Review. [name] returned to exercise this week and is doing well with exercise.      Comments: See ITP comments.[name], RN,BSN 07/21/2019 3:12 PM

## 2019-07-22 ENCOUNTER — Other Ambulatory Visit: Payer: Self-pay

## 2019-07-22 ENCOUNTER — Encounter (HOSPITAL_COMMUNITY)
Admission: RE | Admit: 2019-07-22 | Discharge: 2019-07-22 | Disposition: A | Discharge status: Home / Self Care | Payer: Medicare Other | Source: Ambulatory Visit | Attending: Cardiology | Admitting: Cardiology

## 2019-07-22 ENCOUNTER — Telehealth: Payer: Self-pay | Admitting: Cardiology

## 2019-07-22 DIAGNOSIS — I2119 ST elevation (STEMI) myocardial infarction involving other coronary artery of inferior wall: Secondary | ICD-10-CM

## 2019-07-22 DIAGNOSIS — Z955 Presence of coronary angioplasty implant and graft: Secondary | ICD-10-CM

## 2019-07-22 NOTE — Telephone Encounter (Signed)
New Message   Pt c/o medication issue:  1. Name of Medication: zolpidem (AMBIEN) 5 MG tablet  2. How are you currently taking this medication (dosage and times per day)? Take 1 tablet (5 mg total) by mouth at bedtime as needed for sleep (For Insomina).  3. Are you having a reaction (difficulty breathing--STAT)? No  4. What is your medication issue? Patient was initially taking Lunesta and was changed to Ambien. Patient's wife is calling in requesting that the diagnosis for the patient and the prescription of Lunesta be faxed to the pharmacy. The patient had initially paid out of pocket for the medication Lunesta and they are in the process of trying to be refunded for it since the medicine was switched to Ambien. Letter needs to state that: "Patient tried Lunesta and failed" in order for the patient to be refunded.   Fax Number [phone]

## 2019-07-25 ENCOUNTER — Other Ambulatory Visit: Payer: Self-pay

## 2019-07-25 ENCOUNTER — Encounter (HOSPITAL_COMMUNITY)
Admission: RE | Admit: 2019-07-25 | Discharge: 2019-07-25 | Disposition: A | Discharge status: Home / Self Care | Payer: Medicare Other | Source: Ambulatory Visit | Attending: Cardiology | Admitting: Cardiology

## 2019-07-25 DIAGNOSIS — I2119 ST elevation (STEMI) myocardial infarction involving other coronary artery of inferior wall: Secondary | ICD-10-CM

## 2019-07-25 DIAGNOSIS — Z955 Presence of coronary angioplasty implant and graft: Secondary | ICD-10-CM | POA: Diagnosis not present

## 2019-07-27 ENCOUNTER — Encounter (HOSPITAL_COMMUNITY)
Admission: RE | Admit: 2019-07-27 | Discharge: 2019-07-27 | Disposition: A | Discharge status: Home / Self Care | Payer: Medicare Other | Source: Ambulatory Visit | Attending: Cardiology | Admitting: Cardiology

## 2019-07-27 ENCOUNTER — Other Ambulatory Visit: Payer: Self-pay

## 2019-07-27 DIAGNOSIS — Z955 Presence of coronary angioplasty implant and graft: Secondary | ICD-10-CM

## 2019-07-27 DIAGNOSIS — I2119 ST elevation (STEMI) myocardial infarction involving other coronary artery of inferior wall: Secondary | ICD-10-CM

## 2019-07-29 ENCOUNTER — Encounter (HOSPITAL_COMMUNITY)
Admission: RE | Admit: 2019-07-29 | Discharge: 2019-07-29 | Disposition: A | Discharge status: Home / Self Care | Payer: Medicare Other | Source: Ambulatory Visit | Attending: Cardiology | Admitting: Cardiology

## 2019-07-29 ENCOUNTER — Other Ambulatory Visit: Payer: Self-pay

## 2019-07-29 VITALS — Ht 72.0 in | Wt 262.1 lb

## 2019-07-29 DIAGNOSIS — I2119 ST elevation (STEMI) myocardial infarction involving other coronary artery of inferior wall: Secondary | ICD-10-CM

## 2019-07-29 DIAGNOSIS — Z955 Presence of coronary angioplasty implant and graft: Secondary | ICD-10-CM

## 2019-08-01 ENCOUNTER — Encounter (HOSPITAL_COMMUNITY)
Admission: RE | Admit: 2019-08-01 | Discharge: 2019-08-01 | Disposition: A | Discharge status: Home / Self Care | Payer: Medicare Other | Source: Ambulatory Visit | Attending: Cardiology | Admitting: Cardiology

## 2019-08-01 ENCOUNTER — Other Ambulatory Visit: Payer: Self-pay

## 2019-08-01 DIAGNOSIS — I2119 ST elevation (STEMI) myocardial infarction involving other coronary artery of inferior wall: Secondary | ICD-10-CM | POA: Diagnosis not present

## 2019-08-01 DIAGNOSIS — Z20822 Contact with and (suspected) exposure to covid-19: Secondary | ICD-10-CM

## 2019-08-01 DIAGNOSIS — R6889 Other general symptoms and signs: Secondary | ICD-10-CM | POA: Diagnosis not present

## 2019-08-01 DIAGNOSIS — Z955 Presence of coronary angioplasty implant and graft: Secondary | ICD-10-CM | POA: Diagnosis not present

## 2019-08-01 NOTE — Telephone Encounter (Signed)
LMTCB

## 2019-08-01 NOTE — Telephone Encounter (Signed)
The patient did not fail lunesta.  It was not on his preferred drug list so the patient asked to be switched to Ambien

## 2019-08-01 NOTE — Telephone Encounter (Signed)
Spoke with the pts wife, [name], and she acknowledges the pt needed to switch from [place] to a formulary med, Ambien... she is trying to be reimbursed for what she had paid when he was on it but pharmacy specifically asked her to have Dr. Radford Pax state he "failed" on it.. this is the only way to be refunded.. I advised her that would not be an option since we would not have the pertinent details to back that statement up... I advised that she could possibly give her a letter stating the pt was switched to due cost and to have him on a comparable formulary med... Ambien for their ease financially.. she says she would appreciate that information and I will forward to Dr. Radford Pax for her review.

## 2019-08-01 NOTE — Telephone Encounter (Signed)
Please write a letter stating that patient could not afford Lunesta and had to switch to Ambien

## 2019-08-02 LAB — NOVEL CORONAVIRUS, NAA: SARS-CoV-2, NAA: NOT DETECTED

## 2019-08-02 NOTE — Telephone Encounter (Signed)
I spoke to the patient and informed him that we would send him a letter to assist with this matter.

## 2019-08-03 ENCOUNTER — Encounter (HOSPITAL_COMMUNITY)
Admission: RE | Admit: 2019-08-03 | Discharge: 2019-08-03 | Disposition: A | Discharge status: Home / Self Care | Payer: Medicare Other | Source: Ambulatory Visit | Attending: Cardiology | Admitting: Cardiology

## 2019-08-03 ENCOUNTER — Other Ambulatory Visit: Payer: Self-pay

## 2019-08-03 VITALS — Ht 72.0 in | Wt 261.0 lb

## 2019-08-03 DIAGNOSIS — Z955 Presence of coronary angioplasty implant and graft: Secondary | ICD-10-CM

## 2019-08-03 DIAGNOSIS — I2119 ST elevation (STEMI) myocardial infarction involving other coronary artery of inferior wall: Secondary | ICD-10-CM | POA: Diagnosis not present

## 2019-08-03 NOTE — Progress Notes (Signed)
Discharge Progress Report  Patient Details  Name: [name] MRN: [id number] Date of Birth: 10/23/1951 Referring Provider:     CARDIAC REHAB PHASE II ORIENTATION from 01/20/2019 in [place]  Referring Provider  Dr. Radford Pax       Number of Visits: 29  Reason for Discharge:  Patient reached a stable level of exercise. Patient independent in their exercise. Patient has met program and personal goals.  Smoking History:  Social History   Tobacco Use  Smoking Status Never Smoker  Smokeless Tobacco Never Used    Diagnosis: S/P Stemi 12/30/18 S/P DES RPDA   ADL UCSD:   Initial Exercise Prescription:   Discharge Exercise Prescription (Final Exercise Prescription Changes): Exercise Prescription Changes - 08/03/19 0924      Response to Exercise   Blood Pressure (Admit)  118/72    Blood Pressure (Exercise)  142/80    Blood Pressure (Exit)  118/72    Heart Rate (Admit)  76 bpm    Heart Rate (Exercise)  108 bpm    Heart Rate (Exit)  76 bpm    Rating of Perceived Exertion (Exercise)  11    Perceived Dyspnea (Exercise)  0    Symptoms  None    Comments  Patient completed the phase 2 cardiac rehab program today.    Duration  Continue with 45 min of aerobic exercise without signs/symptoms of physical distress.    Intensity  THRR unchanged      Progression   Progression  Continue to progress workloads to maintain intensity without signs/symptoms of physical distress.    Average METs  4.8      Resistance Training   Training Prescription  No   Relaxation day, no weights.     NuStep   Level  7    SPM  85    Minutes  15    METs  4.8      Arm Ergometer   Level  5    Minutes  15      Home Exercise Plan   Plans to continue exercise at  Home (comment)   Pt is swimming 3-4 days/week and walking.   Frequency  Add 3 additional days to program exercise sessions.    Initial Home Exercises Provided  02/04/19       Functional Capacity: 6  Minute Walk    Row Name 07/29/19 1234         6 Minute Walk   Phase  Discharge     Distance  1771 feet     Distance % Change  21.89 %     Distance Feet Change  318 ft     Walk Time  6 minutes     # of Rest Breaks  0     MPH  3.3     METS  3.5     RPE  13     Perceived Dyspnea   0     VO2 Peak  12.4     Symptoms  No     Resting HR  66 bpm     Resting BP  118/78     Max Ex. HR  101 bpm     Max Ex. BP  138/72     2 Minute Post BP  116/78        Psychological, QOL, Others - Outcomes: PHQ 2/9: Depression screen [facility] 2/9 08/03/2019 07/18/2019 01/24/2019 08/19/2018 05/12/2018  Decreased Interest 0 0 0 0 0  Down, Depressed,  Hopeless 0 0 0 0 0  PHQ - 2 Score 0 0 0 0 0  Altered sleeping - - - - 2  Tired, decreased energy - - - - 1  Change in appetite - - - - 0  Feeling bad or failure about yourself  - - - - 0  Trouble concentrating - - - - 0  Moving slowly or fidgety/restless - - - - 0  Suicidal thoughts - - - - 0  PHQ-9 Score - - - - 3  Difficult doing work/chores - - - - Not difficult at all  Some recent data might be hidden    Quality of Life: Quality of Life - 08/01/19 1702      Quality of Life   Select  Quality of Life      Quality of Life Scores   Health/Function Pre  24.03 %    Health/Function Post  25.3 %    Health/Function % Change  5.29 %    Socioeconomic Pre  27.5 %    Socioeconomic Post  26.75 %    Socioeconomic % Change   -2.73 %    Psych/Spiritual Pre  22.29 %    Psych/Spiritual Post  24.75 %    Psych/Spiritual % Change  11.04 %    Family Pre  28.8 %    Family Post  27.6 %    Family % Change  -4.17 %    GLOBAL Pre  25.09 %    GLOBAL Post  25.83 %    GLOBAL % Change  2.95 %       Personal Goals: Goals established at orientation with interventions provided to work toward goal.    Personal Goals Discharge: Goals and Risk Factor Review    Row Name 03/01/19 1337 03/22/19 1537 07/21/19 1508 08/03/19 1055       Core Components/Risk Factors/Patient  Goals Review   Personal Goals Review  Weight Management/Obesity;Hypertension;Lipids;Stress  Weight Management/Obesity;Hypertension;Lipids;Stress  Weight Management/Obesity;Hypertension;Lipids;Stress  Weight Management/Obesity;Hypertension;Lipids;Stress    Review  pt with multiple CAD RF demonstrates eagerness to participate in CR opportunities. pt personal goals are "peace of mind" with physical activity and safely resume community gym activiites of swimming and treadmill. pt rerpots using eliptical (3.0, 1.4 mile)l at the Club on off days from CR. pt activity limited by hip pain.    pt with multiple CAD RF demonstrates eagerness to participate in CR opportunities. pt personal goals are "peace of mind" with physical activity and safely resume community gym activiites of swimming and treadmill. pt rerpots using eliptical (3.0, 1.4 mile)l at the Club on off days from CR. pt activity limited by hip pain.    [name] has lost weight since the department closure. [name] is doing well with exercise. Vital signs have been stable.  [name] has lost weight since the department closure. [name] is doing well with exercise. Vital signs have been stable. [name] graduates from cardiac rehab. [name] plans to exercise on his own    Expected Outcomes  pt will participate in CR exercise, nutrition and lifestyle modification to decrease overall RF.   pt will participate in CR exercise, nutrition and lifestyle modification to decrease overall RF.   pt will participate in CR exercise, nutrition and lifestyle modification to decrease overall RF.   [name] continue to exercise at cardiac rehab and follow lifestyle modifications upon graduation from cardiac rehab.       Exercise Goals and Review:   Exercise Goals Re-Evaluation: Exercise Goals Re-Evaluation  [name] Name 03/01/19 1417 03/17/19 0930 07/18/19 1035 08/03/19 1006       Exercise Goal Re-Evaluation   Exercise Goals Review  Increase Physical Activity;Increase Strength and  Stamina;Able to understand and use rate of perceived exertion (RPE) scale;Knowledge and understanding of Target Heart Rate Range (THRR);Understanding of Exercise Prescription  Increase Physical Activity;Increase Strength and Stamina;Able to understand and use rate of perceived exertion (RPE) scale;Knowledge and understanding of Target Heart Rate Range (THRR);Understanding of Exercise Prescription  Increase Physical Activity;Increase Strength and Stamina;Able to understand and use rate of perceived exertion (RPE) scale;Knowledge and understanding of Target Heart Rate Range (THRR);Understanding of Exercise Prescription;Able to check pulse independently  Increase Physical Activity;Increase Strength and Stamina;Able to understand and use rate of perceived exertion (RPE) scale;Knowledge and understanding of Target Heart Rate Range (THRR);Understanding of Exercise Prescription;Able to check pulse independently    Comments  Reviewed METs and goals with Pt. Pt MET level is 5.5. Pt is tolerating exercise well and increasing workloads gradually. Pt is exercising at a local gym in the pool 5-7 days a week for 30-45 minutes in addition to Cardiac Rehab.   Unable to review METs and goals with Pt due to closure of Cardiac Rehab for [place]. Pt MET level is 6.5 and is progressing well. Pt is tolerating exercise Rx well.   Pt's first session of exercise at cardiac rehab since departmental closure due to COVID-19 pandemic. Pt tolerated exercise well. Reviewed target heart rate range with patient, and pt has a HR monitor to check his pulse. Patient goes to the pool for ~an hour and walks 2 laps and swims 1 lap as his mode of home exercise. Pt also states that he averages ~8,000-9,000 steps/day.  Patient completed the phase 2 cardiac rehab program and plans to continue exercise routine at home. Patient is exercising at the pool 4-5 days/week for 60 minutes. Pt is walking daily and monitors his steps, achieving 8,000 -9,000  steps/day.Pt has lost 17lbs since January, and functional capacity has improved 22% as measured by 6MWT. Strength increased 42% as measured by grip strength test, while flexibility increased 100% as measured by sit and reach test.    Expected Outcomes  Will continue to monitor and progress Pt as tolerated.   Will continue to monitor and progress Pt as tolerated.   Increase workloads as tolerated to help improve fitness.  Patient will continue daily exercise routine independently at this time to maintain weight loss and fitness improvements.       Nutrition & Weight - Outcomes:  Post Biometrics - 08/03/19 0924       Post  Biometrics   Height  6' (1.829 m)    Weight  261 lb 0.4 oz (118.4 kg)    Waist Circumference  49.5 inches    Hip Circumference  45.5 inches    Waist to Hip Ratio  1.09 %    BMI (Calculated)  35.39    Triceps Skinfold  35 mm    % Body Fat  37.9 %    Grip Strength  47 kg    Flexibility  10 in    Single Leg Stand  8.25 seconds       Nutrition:   Nutrition Discharge:   Education Questionnaire Score: Knowledge Questionnaire Score - 08/01/19 1702      Knowledge Questionnaire Score   Pre Score  23/24    Post Score  23/24       Goals reviewed with patient; copy given to patient. Pt graduated  from cardiac rehab program on 08/03/19 with completion of 29 exercise sessions in Phase II. Pt maintained good attendance and progressed nicely during his participation in rehab as evidenced by increased MET level.   Medication list reconciled. Repeat  PHQ score-  0.  Pt has made significant lifestyle changes and should be commended for his success. Pt feels he has achieved his goals during cardiac rehab. [name] plans to continue exercise by swimming and doing yard work. We are proud of [name]'s progress.[name], RN,BSN 08/10/2019 10:51 AM

## 2019-08-05 ENCOUNTER — Encounter (HOSPITAL_COMMUNITY): Payer: Medicare Other

## 2019-08-11 DIAGNOSIS — R972 Elevated prostate specific antigen [PSA]: Secondary | ICD-10-CM | POA: Diagnosis not present

## 2019-08-11 DIAGNOSIS — R351 Nocturia: Secondary | ICD-10-CM | POA: Diagnosis not present

## 2019-08-11 DIAGNOSIS — N401 Enlarged prostate with lower urinary tract symptoms: Secondary | ICD-10-CM | POA: Diagnosis not present

## 2019-09-01 DIAGNOSIS — Z96653 Presence of artificial knee joint, bilateral: Secondary | ICD-10-CM | POA: Diagnosis not present

## 2019-09-01 DIAGNOSIS — Z471 Aftercare following joint replacement surgery: Secondary | ICD-10-CM | POA: Diagnosis not present

## 2019-09-01 DIAGNOSIS — Z96641 Presence of right artificial hip joint: Secondary | ICD-10-CM | POA: Diagnosis not present

## 2019-09-01 DIAGNOSIS — Z96643 Presence of artificial hip joint, bilateral: Secondary | ICD-10-CM | POA: Diagnosis not present

## 2019-09-01 DIAGNOSIS — Z96642 Presence of left artificial hip joint: Secondary | ICD-10-CM | POA: Diagnosis not present

## 2019-09-09 DIAGNOSIS — Z96643 Presence of artificial hip joint, bilateral: Secondary | ICD-10-CM | POA: Diagnosis not present

## 2019-09-21 NOTE — Telephone Encounter (Signed)
D/L is in [facility]

## 2019-09-23 ENCOUNTER — Other Ambulatory Visit: Payer: Self-pay | Admitting: Orthopedic Surgery

## 2019-09-23 ENCOUNTER — Telehealth: Payer: Self-pay | Admitting: *Deleted

## 2019-09-23 DIAGNOSIS — M25551 Pain in right hip: Secondary | ICD-10-CM

## 2019-09-23 NOTE — Telephone Encounter (Signed)
-----   Message from Sueanne Margarita, MD sent at 09/21/2019  7:23 PM EDT ----- Good AHI and compliance.  Continue current PAP settings.

## 2019-09-23 NOTE — Telephone Encounter (Signed)
Informed patient of compliance results and patient understanding was verbalized. Patient is aware and agreeable to AHI being within range at 7.2. Patient is aware and agreeable to being in compliance with machine usage Patient is aware and agreeable to no change in current pressures

## 2019-09-26 ENCOUNTER — Ambulatory Visit: Payer: Medicare Other | Admitting: Cardiology

## 2019-09-26 ENCOUNTER — Encounter: Payer: Self-pay | Admitting: *Deleted

## 2019-09-26 NOTE — Telephone Encounter (Signed)

## 2019-09-26 NOTE — Telephone Encounter (Signed)
This encounter was created in error - please disregard.

## 2019-09-30 ENCOUNTER — Ambulatory Visit (INDEPENDENT_AMBULATORY_CARE_PROVIDER_SITE_OTHER): Payer: Medicare Other

## 2019-09-30 ENCOUNTER — Other Ambulatory Visit: Payer: Self-pay

## 2019-09-30 DIAGNOSIS — Z23 Encounter for immunization: Secondary | ICD-10-CM | POA: Diagnosis not present

## 2019-10-10 ENCOUNTER — Encounter: Payer: Self-pay | Admitting: Cardiology

## 2019-10-10 NOTE — Progress Notes (Signed)
Cardiology Office Note:    Date:  10/11/2019   ID:  [name], DOB May 11, 1951, MRN [id number]  PCP:  Binnie Rail, MD  Cardiologist:  No primary care provider on file.    Referring MD: Binnie Rail, MD   Chief Complaint  Patient presents with  . Coronary Artery Disease  . Hypertension  . Hyperlipidemia    History of Present Illness:    [name] is a 68 y.o. male with a hx of ASCAD s/p inferior STEMI with cath showing 95% PDA that was felt to be the culprit vessel. This was treated with PCI-DES. Also noted was CTO CFX with collaterals from the PDA on OM1. There was an unsuccessful attempt at opening the CFX.Also has 70% mid LAD lesion, to be treated medically.EF was 60% with inferobasilar HK. Echo showed EF 65% with no WMA, grade 1 DD.He also has a hx of OSA on CPAP, obesity, HTN and PAF on anticoagulation.    He is here today for followup and is doing well.  He denies any chest pain or pressure, SOB, DOE, PND, orthopnea, LE edema, dizziness, palpitations or syncope. He is compliant with his meds and is tolerating meds with no SE.    Past Medical History:  Diagnosis Date  . Arthritis    Knees  . BPH (benign prostatic hypertrophy)   . COLONIC POLYPS, HX OF 2007   clear colo w/o polyps 04/2015: 77yr follow up  . Coronary artery disease   . HYPERTENSION   . HYPOTHYROIDISM   . OBESITY     Past Surgical History:  Procedure Laterality Date  . arthroscopic knee surgery     (R) 2003 & (L) 2010  . CARDIAC CATHETERIZATION    . COLONOSCOPY  04/2015   no polyps ([place])  . CORONARY/GRAFT ACUTE MI REVASCULARIZATION N/A 12/30/2018   Procedure: Coronary/Graft Acute MI Revascularization;  Surgeon: Belva Crome, MD;  Location: [place] [facility];  Service: Cardiovascular;  Laterality: N/A;  . LAMINECTOMY  1991  . LEFT HEART CATH AND CORONARY ANGIOGRAPHY N/A 12/30/2018   Procedure: LEFT HEART CATH AND CORONARY ANGIOGRAPHY;  Surgeon: Belva Crome, MD;  Location: [place] [facility];  Service: Cardiovascular;  Laterality: N/A;  . TONSILLECTOMY  1990   w/ adenoids and uvula  . TONSILLECTOMY    . TOTAL HIP ARTHROPLASTY Right 01/2011   [place]  . TOTAL HIP ARTHROPLASTY Left 04/16/2016   Procedure: TOTAL HIP ARTHROPLASTY ANTERIOR APPROACH;  Surgeon: Gaynelle Arabian, MD;  Location: [facility];  Service: Orthopedics;  Laterality: Left;  . TOTAL KNEE ARTHROPLASTY  12/27/2012   Procedure: TOTAL KNEE BILATERAL;  Surgeon: Gearlean Alf, MD;  Location: [facility];  Service: Orthopedics;  Laterality: Bilateral;    Current Medications: Current Meds  Medication Sig  . acetaminophen (TYLENOL) 325 MG tablet Take 2 tablets (650 mg total) by mouth every 4 (four) hours as needed for headache or mild pain.  Marland Kitchen aspirin 81 MG chewable tablet Chew 1 tablet (81 mg total) by mouth daily.  Marland Kitchen atorvastatin (LIPITOR) 80 MG tablet Take 1 tablet (80 mg total) by mouth daily at 6 PM.  . levothyroxine (SYNTHROID) 150 MCG tablet Take 1 tablet (150 mcg total) by mouth daily.  Marland Kitchen losartan (COZAAR) 50 MG tablet Take 1 tablet (50 mg total) by mouth daily.  . metoprolol succinate (TOPROL-XL) 25 MG 24 hr tablet Take 1 tablet (25 mg total) by mouth daily.  . Multiple Vitamins-Minerals (MULTIVITAMIN) tablet Take 1  tablet by mouth daily.  . nitroGLYCERIN (NITROSTAT) 0.4 MG SL tablet Place 1 tablet (0.4 mg total) under the tongue every 5 (five) minutes as needed for chest pain.  . ticagrelor (BRILINTA) 90 MG TABS tablet Take 1 tablet (90 mg total) by mouth 2 (two) times daily.  Marland Kitchen zolpidem (AMBIEN) 5 MG tablet Take 1 tablet (5 mg total) by mouth at bedtime as needed for sleep (For Insomina).     Allergies:   Patient has no known allergies.   Social History   Socioeconomic History  . Marital status: Married    Spouse name: Not on file  . Number of children: Not on file  . Years of education: Not on file  . Highest education level: Not on file  Occupational History  . Occupation: Retired  Photographer  . Financial resource strain: Not hard at all  . Food insecurity    Worry: Never true    Inability: Never true  . Transportation needs    Medical: No    Non-medical: No  Tobacco Use  . Smoking status: Never Smoker  . Smokeless tobacco: Never Used  Substance and Sexual Activity  . Alcohol use: Yes    Alcohol/week: 0.0 standard drinks    Comment: rarely, social  . Drug use: No  . Sexual activity: Not on file  Lifestyle  . Physical activity    Days per week: 5 days    Minutes per session: 30 min  . Stress: Only a little  Relationships  . Social Herbalist on phone: Not on file    Gets together: Not on file    Attends religious service: Not on file    Active member of club or organization: Not on file    Attends meetings of clubs or organizations: Not on file    Relationship status: Not on file  Other Topics Concern  . Not on file  Social History Narrative   Working part time, contemplating retirement end of 2016   Lives at home with spouse      Exercise: water exercises     Family History: The patient's family history includes ALS in his maternal grandfather; Arthritis in an other family member; Dementia in his mother; Diabetes in his father; Heart disease (age of onset: 31) in his father; Hypertension in his father. There is no history of Colon cancer.  ROS:   Please see the history of present illness.    ROS  All other systems reviewed and negative.   EKGs/Labs/Other Studies Reviewed:    The following studies were reviewed today: none  EKG:  EKG is not ordered today.   Recent Labs: 12/30/2018: TSH 3.809 12/31/2018: BUN 11; Creatinine, Ser 0.78; Hemoglobin 13.1; Platelets 148; Potassium 3.9; Sodium 140 02/21/2019: ALT 88   Recent Lipid Panel    Component Value Date/Time   CHOL 80 (L) 02/21/2019 1002   TRIG 49 02/21/2019 1002   HDL 36 (L) 02/21/2019 1002   CHOLHDL 2.2 02/21/2019 1002   CHOLHDL 4.3 12/30/2018 2137   VLDL 17 12/30/2018 2137    LDLCALC 34 02/21/2019 1002   LDLDIRECT 153.1 06/09/2011 0942    Physical Exam:    VS:  BP 124/70   Pulse 73   Ht 6' (1.829 m)   Wt 256 lb (116.1 kg)   SpO2 96%   BMI 34.72 kg/m     Wt Readings from Last 3 Encounters:  10/11/19 256 lb (116.1 kg)  08/03/19 261 lb 0.4  oz (118.4 kg)  07/29/19 262 lb 2 oz (118.9 kg)     GEN:  Well nourished, well developed in no acute distress HEENT: Normal NECK: No JVD; No carotid bruits LYMPHATICS: No lymphadenopathy CARDIAC: RRR, no murmurs, rubs, gallops RESPIRATORY:  Clear to auscultation without rales, wheezing or rhonchi  ABDOMEN: Soft, non-tender, non-distended MUSCULOSKELETAL:  No edema; No deformity  SKIN: Warm and dry NEUROLOGIC:  Alert and oriented x 3 PSYCHIATRIC:  Normal affect   ASSESSMENT:    1. CAD S/P percutaneous coronary angioplasty   2. Essential hypertension   3. Pure hypercholesterolemia    PLAN:    In order of problems listed above:  1.  ASCAD -s/p inferior STEMI with cath showing 95% PDA that was felt to be the culprit vessel. This was treated with PCI-DES. Also noted was CTO CFX with collaterals from the PDA on OM1. There was an unsuccessful attempt at opening the CFX.Also has 70% mid LAD lesion, to be treated medically.EF was 60% with inferobasilar HK. -he denies any anginal sx -continue ASA 81mg  daily, statin and BB -continue Brilinta 90mg  BID through 12/31/2019 and the change to Plavix 75mg  daily  2.  HTN -BP controlled -continue Toprol XL 25mg  daily and Losartan 50mg  daily -creatinine 0.780 in Jan 2020 -repeat BMET in 6 months  3.  HLD -LDL goal < 70 -LDL was 34 on 01/2019. -continue Atorvastatin 80mg  daily. -check FLP and ALT in 6 months   Medication Adjustments/Labs and Tests Ordered: Current medicines are reviewed at length with the patient today.  Concerns regarding medicines are outlined above.  No orders of the defined types were placed in this encounter.  No orders of the defined types  were placed in this encounter.   Signed, Fransico Him, MD  10/11/2019 8:19 AM    [place]

## 2019-10-11 ENCOUNTER — Encounter: Payer: Self-pay | Admitting: Cardiology

## 2019-10-11 ENCOUNTER — Other Ambulatory Visit: Payer: Self-pay

## 2019-10-11 ENCOUNTER — Ambulatory Visit (INDEPENDENT_AMBULATORY_CARE_PROVIDER_SITE_OTHER): Payer: Medicare Other | Admitting: Cardiology

## 2019-10-11 VITALS — BP 124/70 | HR 73 | Ht 72.0 in | Wt 256.0 lb

## 2019-10-11 DIAGNOSIS — I10 Essential (primary) hypertension: Secondary | ICD-10-CM

## 2019-10-11 DIAGNOSIS — Z9861 Coronary angioplasty status: Secondary | ICD-10-CM

## 2019-10-11 DIAGNOSIS — I2510 Atherosclerotic heart disease of native coronary artery without angina pectoris: Secondary | ICD-10-CM | POA: Diagnosis not present

## 2019-10-11 DIAGNOSIS — E7800 Pure hypercholesterolemia, unspecified: Secondary | ICD-10-CM

## 2019-10-11 MED ORDER — CLOPIDOGREL BISULFATE 75 MG PO TABS: 75.0000 mg | ORAL_TABLET | Freq: Every day | ORAL | 3 refills | Status: DC

## 2019-10-11 MED ORDER — TICAGRELOR 90 MG PO TABS: 90.0000 mg | ORAL_TABLET | Freq: Two times a day (BID) | ORAL | 3 refills | Status: AC

## 2019-10-11 NOTE — Patient Instructions (Signed)
Medication Instructions:  Continue Brilinta until January.   Take last dose of Brilinta on December 30, 2019.   On December 31, 2019 start clopidogrel (Plavix) 75 mg once a day.  Take this every day.   If you need a refill on your cardiac medications before your next appointment, please call your pharmacy.   Lab work: In 6 months fasting lipids/liver and bmet --we will collect this at your next appointment with Dr. Radford Pax.  If you have labs (blood work) drawn today and your tests are completely normal, you will receive your results only by: Marland Kitchen MyChart Message (if you have MyChart) OR . A paper copy in the mail If you have any lab test that is abnormal or we need to change your treatment, we will call you to review the results.  Testing/Procedures: none  Follow-Up: At [facility], you and your health needs are our priority.  As part of our continuing mission to provide you with exceptional heart care, we have created designated Provider Care Teams.  These Care Teams include your primary Cardiologist (physician) and Advanced Practice Providers (APPs -  Physician Assistants and Nurse Practitioners) who all work together to provide you with the care you need, when you need it. You will need a follow up appointment in 6 months.  Please call our office 2 months in advance to schedule this appointment.  You may see Fransico Him, MD or one of the following Advanced Practice Providers on your designated Care Team:   [place], PA-C Melina Copa, PA-C . Ermalinda Barrios, PA-C  Any Other Special Instructions Will Be Listed Below (If Applicable).

## 2019-10-12 ENCOUNTER — Other Ambulatory Visit: Payer: Self-pay | Admitting: Cardiology

## 2019-10-18 ENCOUNTER — Ambulatory Visit
Admission: RE | Admit: 2019-10-18 | Discharge: 2019-10-18 | Disposition: A | Discharge status: Home / Self Care | Payer: Medicare Other | Source: Ambulatory Visit | Attending: Orthopedic Surgery | Admitting: Orthopedic Surgery

## 2019-10-18 ENCOUNTER — Other Ambulatory Visit: Payer: Self-pay

## 2019-10-18 DIAGNOSIS — M25551 Pain in right hip: Secondary | ICD-10-CM

## 2019-10-27 DIAGNOSIS — M25551 Pain in right hip: Secondary | ICD-10-CM | POA: Diagnosis not present

## 2019-10-27 DIAGNOSIS — Z96641 Presence of right artificial hip joint: Secondary | ICD-10-CM | POA: Diagnosis not present

## 2019-10-31 ENCOUNTER — Telehealth: Payer: Self-pay

## 2019-10-31 NOTE — Telephone Encounter (Signed)
If they will let him continue on ASA then would prefer to continue ASA and ok to hold Plavix

## 2019-10-31 NOTE — Telephone Encounter (Signed)
Dr. Radford Pax to comment on holding aspirin and Brilinta prior to R hip surgery. Patient's last PCI was on 12/30/2018, earliest time to consider holding Brilinta would be 12/31/2019. Surgery date is 1/6.   Dr. Radford Pax, would you be ok with holding aspirin and plavix for 5 days prior to surgery? Or would you recommend hold brilinta and continue on aspirin?  Please forward your response to P [facility] PREOP

## 2019-10-31 NOTE — Telephone Encounter (Signed)
   Primary Cardiologist: Fransico Him, MD  Chart reviewed as part of pre-operative protocol coverage. Patient was contacted 10/31/2019 in reference to pre-operative risk assessment for pending surgery as outlined below.  [name] was last seen on 10/11/2019 by Dr. Radford Pax.  Since that day, [name] has done well without significant chest pain or worsening dyspnea.  Therefore, based on ACC/AHA guidelines, the patient would be at acceptable risk for the planned procedure without further cardiovascular testing.   I will route this recommendation to the requesting party via Epic fax function and remove from pre-op pool.  Please call with questions. Per Dr. Radford Pax, ok to hold Brilinta for 5 days, patient was given a prescription for plavix to replace Brilinta after surgery at the discretion of the surgeon based on bleeding risk. Per Dr. Radford Pax, recommend continue aspirin 81 mg daily through the surgery.  [place], [place] 10/31/2019, 1:12 PM

## 2019-10-31 NOTE — Telephone Encounter (Signed)
   [facility] Pre-operative Risk Assessment    Request for surgical clearance:  1. What type of surgery is being performed? Right total hip revision   2. When is this surgery scheduled? 01/04/2020   3. What type of clearance is required (medical clearance vs. Pharmacy clearance to hold med vs. Both)? Pharmacy  4. Are there any medications that need to be held prior to surgery and how long? Brilinta/Aspirin   5. Practice name and name of physician performing surgery? [facility]/Dr. Pilar Plate Allusio   6. What is your office phone number [phone]    7.   What is your office fax number [phone]: [place]  8.   Anesthesia type (None, local, MAC, general) ? Choice   Mady Haagensen 10/31/2019, 9:26 AM  _________________________________________________________________   (provider comments below)

## 2019-11-02 DIAGNOSIS — Z01818 Encounter for other preprocedural examination: Secondary | ICD-10-CM | POA: Insufficient documentation

## 2019-11-02 NOTE — Patient Instructions (Addendum)
  Tests ordered today. Your results will be released to [facility] (or called to you) after review.  If any changes need to be made, you will be notified at that same time.   Pneumonia immunization administered today.   Medications reviewed and updated.  Changes include :   None   Your prescription(s) have been submitted to your pharmacy. Please take as directed and contact our office if you believe you are having problem(s) with the medication(s).  Please followup in 6 months

## 2019-11-02 NOTE — Progress Notes (Signed)
Subjective:    Patient ID: [name], male    DOB: 1951/08/17, 68 y.o.   MRN: [id number]  HPI He is here for pre-operative clearance at the request of Dr Wynelle Link for right hip bearing surface or total hip arthroplasty revision scheduled for 01/04/2020. He is also here for follow up of his chronic medical conditions.  He denies any personal or family history of problems with anesthesia or bleeding/blood clot problems.    He has no concerns.  He is taking all his medication as prescribed.   He is exercising regularly - water exercises and yard work.  With his daily activities he denies chest pain, palpitations, SOB and lightheadedness.     CAD, NSTEMI in 12/2018, Hypertension: He is taking his medication daily. He is more compliant with a low sodium diet.  He denies chest pain, palpitations, edema, shortness of breath and regular headaches.     Prediabetes:  He is compliant with a low sugar/carbohydrate diet.  He is exercising regularly.  Hypothyroidism:  He is taking his medication daily.  He denies any recent changes in energy or weight that are unexplained.   Hyperlipidemia: He is taking his medication daily. He is compliant with a low fat/cholesterol diet. He denies myalgias.   Insomnia:  He is taking ambien prn only.  He takes one every two weeks on average.  He uses the bipap nightly.  He sleeps about 6-6 1/2 hours a night.     Medications and allergies reviewed with patient and updated if appropriate.  Patient Active Problem List   Diagnosis Date Noted  . Preop examination 11/02/2019  . OSA treated with BiPAP 05/03/2019  . CAD S/P percutaneous coronary angioplasty 01/11/2019  . CAD (coronary artery disease), native coronary artery 01/11/2019  . HLD (hyperlipidemia) 01/11/2019  . Acute ST elevation myocardial infarction (STEMI) of inferior wall ([place]) 12/30/2018  . ST elevation myocardial infarction (STEMI) ([place]) 12/30/2018  . Coronary artery disease involving native  artery of transplanted heart with unstable angina pectoris ([place])   . Rash and nonspecific skin eruption 02/19/2018  . Sleep disorder 07/02/2017  . Prediabetes 05/11/2017  . Ventral hernia without obstruction or gangrene 05/11/2017  . Umbilical hernia without obstruction or gangrene 05/11/2017  . OA (osteoarthritis) of hip 04/16/2016  . Pre-syncope 02/22/2016  . Vertigo 02/22/2016  . Knee joint replacement status 01/12/2013  . OA (osteoarthritis) of knee 12/27/2012  . Benign prostatic hyperplasia   . Hypothyroidism 09/04/2009  . Obesity 09/04/2009  . Essential hypertension 09/04/2009  . ARTHRITIS 09/04/2009  . COLONIC POLYPS, HX OF 09/04/2009    Current Outpatient Medications on File Prior to Visit  Medication Sig Dispense Refill  . acetaminophen (TYLENOL) 325 MG tablet Take 2 tablets (650 mg total) by mouth every 4 (four) hours as needed for headache or mild pain.    Marland Kitchen aspirin 81 MG chewable tablet Chew 1 tablet (81 mg total) by mouth daily.    Marland Kitchen atorvastatin (LIPITOR) 80 MG tablet Take 1 tablet (80 mg total) by mouth daily at 6 PM. 90 tablet 3  . levothyroxine (SYNTHROID) 150 MCG tablet Take 1 tablet (150 mcg total) by mouth daily. 90 tablet 1  . losartan (COZAAR) 50 MG tablet TAKE 1 TABLET BY MOUTH EVERY DAY 90 tablet 3  . metoprolol succinate (TOPROL-XL) 25 MG 24 hr tablet Take 1 tablet (25 mg total) by mouth daily. 90 tablet 3  . Multiple Vitamins-Minerals (MULTIVITAMIN) tablet Take 1 tablet by mouth daily.    Marland Kitchen  nitroGLYCERIN (NITROSTAT) 0.4 MG SL tablet Place 1 tablet (0.4 mg total) under the tongue every 5 (five) minutes as needed for chest pain. 25 tablet 5  . ticagrelor (BRILINTA) 90 MG TABS tablet Take 1 tablet (90 mg total) by mouth 2 (two) times daily. 180 tablet 3  . zolpidem (AMBIEN) 5 MG tablet Take 1 tablet (5 mg total) by mouth at bedtime as needed for sleep (For [place]). 20 tablet 3  . [START ON 12/31/2019] clopidogrel (PLAVIX) 75 MG tablet Take 1 tablet (75 mg total)  by mouth daily. (Patient not taking: Reported on 11/03/2019) 90 tablet 3   No current facility-administered medications on file prior to visit.     Past Medical History:  Diagnosis Date  . Arthritis    Knees  . BPH (benign prostatic hypertrophy)   . COLONIC POLYPS, HX OF 2007   clear colo w/o polyps 04/2015: 28yr follow up  . Coronary artery disease   . HYPERTENSION   . HYPOTHYROIDISM   . OBESITY     Past Surgical History:  Procedure Laterality Date  . arthroscopic knee surgery     (R) 2003 & (L) 2010  . CARDIAC CATHETERIZATION    . COLONOSCOPY  04/2015   no polyps ([place])  . CORONARY/GRAFT ACUTE MI REVASCULARIZATION N/A 12/30/2018   Procedure: Coronary/Graft Acute MI Revascularization;  Surgeon: Belva Crome, MD;  Location: [facility];  Service: Cardiovascular;  Laterality: N/A;  . LAMINECTOMY  1991  . LEFT HEART CATH AND CORONARY ANGIOGRAPHY N/A 12/30/2018   Procedure: LEFT HEART CATH AND CORONARY ANGIOGRAPHY;  Surgeon: Belva Crome, MD;  Location: [facility];  Service: Cardiovascular;  Laterality: N/A;  . TONSILLECTOMY  1990   w/ adenoids and uvula  . TONSILLECTOMY    . TOTAL HIP ARTHROPLASTY Right 01/2011   [place]  . TOTAL HIP ARTHROPLASTY Left 04/16/2016   Procedure: TOTAL HIP ARTHROPLASTY ANTERIOR APPROACH;  Surgeon: Gaynelle Arabian, MD;  Location: [facility];  Service: Orthopedics;  Laterality: Left;  . TOTAL KNEE ARTHROPLASTY  12/27/2012   Procedure: TOTAL KNEE BILATERAL;  Surgeon: Gearlean Alf, MD;  Location: [facility];  Service: Orthopedics;  Laterality: Bilateral;    Social History   Socioeconomic History  . Marital status: Married    Spouse name: Not on file  . Number of children: Not on file  . Years of education: Not on file  . Highest education level: Not on file  Occupational History  . Occupation: Retired  Scientific laboratory technician  . Financial resource strain: Not hard at all  . Food insecurity    Worry: Never true    Inability: Never true  .  Transportation needs    Medical: No    Non-medical: No  Tobacco Use  . Smoking status: Never Smoker  . Smokeless tobacco: Never Used  Substance and Sexual Activity  . Alcohol use: Yes    Alcohol/week: 0.0 standard drinks    Comment: rarely, social  . Drug use: No  . Sexual activity: Not on file  Lifestyle  . Physical activity    Days per week: 5 days    Minutes per session: 30 min  . Stress: Only a little  Relationships  . Social Herbalist on phone: Not on file    Gets together: Not on file    Attends religious service: Not on file    Active member of club or organization: Not on file    Attends meetings of clubs  or organizations: Not on file    Relationship status: Not on file  Other Topics Concern  . Not on file  Social History Narrative   Working part time, contemplating retirement end of 2016   Lives at home with spouse      Exercise: water exercises    Family History  Problem Relation Age of Onset  . Heart disease Father 37       CABG age 25  . Hypertension Father   . Diabetes Father   . Dementia Mother        ?ALS vs SNP  . ALS Maternal Grandfather   . Arthritis Other        Parent & grandparents  . Colon cancer Neg Hx     Review of Systems  Constitutional: Negative for chills, fatigue and fever.  Eyes: Negative for visual disturbance.  Respiratory: Negative for cough, shortness of breath and wheezing.   Cardiovascular: Negative for chest pain, palpitations and leg swelling.  Gastrointestinal: Positive for constipation (takes miralax). Negative for abdominal pain, blood in stool, diarrhea and nausea.       No gerd  Genitourinary: Negative for dysuria and hematuria.  Musculoskeletal: Positive for arthralgias.  Neurological: Negative for light-headedness and headaches.       Objective:   Vitals:   11/03/19 0748  BP: 126/78  Pulse: (!) 49  Temp: 97.8 F (36.6 C)  SpO2: 97%   Filed Weights   11/03/19 0748  Weight: 260 lb (117.9 kg)    Body mass index is 35.26 kg/m.  BP Readings from Last 3 Encounters:  11/03/19 126/78  10/11/19 124/70  07/07/19 (!) 147/75    Wt Readings from Last 3 Encounters:  11/03/19 260 lb (117.9 kg)  10/11/19 256 lb (116.1 kg)  08/03/19 261 lb 0.4 oz (118.4 kg)     Physical Exam Constitutional: He appears well-developed and well-nourished. No distress.  HENT:  Head: Normocephalic and atraumatic.  Right Ear: External ear normal.  Left Ear: External ear normal.  Mouth/Throat: Oropharynx is clear and moist.  Normal ear canals and TM b/l  Eyes: Conjunctivae and EOM are normal.  Neck: Neck supple. No tracheal deviation present. No thyromegaly present.  No carotid bruit  Cardiovascular: Normal rate, regular rhythm, normal heart sounds and intact distal pulses.   No murmur heard.  No edema. Pulmonary/Chest: Effort normal and breath sounds normal. No respiratory distress. He has no wheezes. He has no rales.  Abdominal: Soft. He exhibits no distension. There is no tenderness.  Lymphadenopathy:   He has no cervical adenopathy.  Skin: Skin is warm and dry. He is not diaphoretic.  Psychiatric: He has a normal mood and affect. His behavior is normal.         Assessment & Plan:     See Problem List for Assessment and Plan of chronic medical problems.

## 2019-11-03 ENCOUNTER — Encounter: Payer: Self-pay | Admitting: Internal Medicine

## 2019-11-03 ENCOUNTER — Ambulatory Visit (INDEPENDENT_AMBULATORY_CARE_PROVIDER_SITE_OTHER): Payer: Medicare Other | Admitting: Internal Medicine

## 2019-11-03 ENCOUNTER — Other Ambulatory Visit (INDEPENDENT_AMBULATORY_CARE_PROVIDER_SITE_OTHER): Payer: Medicare Other

## 2019-11-03 ENCOUNTER — Other Ambulatory Visit: Payer: Self-pay

## 2019-11-03 VITALS — BP 126/78 | HR 49 | Temp 97.8°F | Ht 72.0 in | Wt 260.0 lb

## 2019-11-03 DIAGNOSIS — R7303 Prediabetes: Secondary | ICD-10-CM

## 2019-11-03 DIAGNOSIS — Z01818 Encounter for other preprocedural examination: Secondary | ICD-10-CM | POA: Diagnosis not present

## 2019-11-03 DIAGNOSIS — D1801 Hemangioma of skin and subcutaneous tissue: Secondary | ICD-10-CM | POA: Diagnosis not present

## 2019-11-03 DIAGNOSIS — D225 Melanocytic nevi of trunk: Secondary | ICD-10-CM | POA: Diagnosis not present

## 2019-11-03 DIAGNOSIS — E034 Atrophy of thyroid (acquired): Secondary | ICD-10-CM | POA: Diagnosis not present

## 2019-11-03 DIAGNOSIS — I10 Essential (primary) hypertension: Secondary | ICD-10-CM

## 2019-11-03 DIAGNOSIS — E782 Mixed hyperlipidemia: Secondary | ICD-10-CM

## 2019-11-03 DIAGNOSIS — D2262 Melanocytic nevi of left upper limb, including shoulder: Secondary | ICD-10-CM | POA: Diagnosis not present

## 2019-11-03 DIAGNOSIS — Z23 Encounter for immunization: Secondary | ICD-10-CM

## 2019-11-03 DIAGNOSIS — Z9861 Coronary angioplasty status: Secondary | ICD-10-CM

## 2019-11-03 DIAGNOSIS — I2510 Atherosclerotic heart disease of native coronary artery without angina pectoris: Secondary | ICD-10-CM | POA: Diagnosis not present

## 2019-11-03 DIAGNOSIS — D2261 Melanocytic nevi of right upper limb, including shoulder: Secondary | ICD-10-CM | POA: Diagnosis not present

## 2019-11-03 DIAGNOSIS — L2089 Other atopic dermatitis: Secondary | ICD-10-CM | POA: Diagnosis not present

## 2019-11-03 DIAGNOSIS — L814 Other melanin hyperpigmentation: Secondary | ICD-10-CM | POA: Diagnosis not present

## 2019-11-03 DIAGNOSIS — G479 Sleep disorder, unspecified: Secondary | ICD-10-CM | POA: Diagnosis not present

## 2019-11-03 LAB — COMPREHENSIVE METABOLIC PANEL
ALT: 54 U/L — ABNORMAL HIGH (ref 0–53)
AST: 40 U/L — ABNORMAL HIGH (ref 0–37)
Albumin: 4.2 g/dL (ref 3.5–5.2)
Alkaline Phosphatase: 105 U/L (ref 39–117)
BUN: 14 mg/dL (ref 6–23)
CO2: 27 mEq/L (ref 19–32)
Calcium: 9.7 mg/dL (ref 8.4–10.5)
Chloride: 104 mEq/L (ref 96–112)
Creatinine, Ser: 0.78 mg/dL (ref 0.40–1.50)
GFR: 99.48 mL/min (ref >60.00)
Glucose, Bld: 106 mg/dL — ABNORMAL HIGH (ref 70–99)
Potassium: 4.6 mEq/L (ref 3.5–5.1)
Sodium: 138 mEq/L (ref 135–145)
Total Bilirubin: 0.5 mg/dL (ref 0.2–1.2)
Total Protein: 7.7 g/dL (ref 6.0–8.3)

## 2019-11-03 LAB — CBC WITH DIFFERENTIAL/PLATELET
Basophils Absolute: 0.1 10*3/uL (ref 0.0–0.1)
Basophils Relative: 0.9 % (ref 0.0–3.0)
Eosinophils Absolute: 0.2 10*3/uL (ref 0.0–0.7)
Eosinophils Relative: 3.2 % (ref 0.0–5.0)
HCT: 43.1 % (ref 39.0–52.0)
Hemoglobin: 14.3 g/dL (ref 13.0–17.0)
Lymphocytes Relative: 22.5 % (ref 12.0–46.0)
Lymphs Abs: 1.3 10*3/uL (ref 0.7–4.0)
MCHC: 33.2 g/dL (ref 30.0–36.0)
MCV: 86.3 fl (ref 78.0–100.0)
Monocytes Absolute: 0.7 10*3/uL (ref 0.1–1.0)
Monocytes Relative: 12.1 % — ABNORMAL HIGH (ref 3.0–12.0)
Neutro Abs: 3.4 10*3/uL (ref 1.4–7.7)
Neutrophils Relative %: 61.3 % (ref 43.0–77.0)
Platelets: 164 10*3/uL (ref 150.0–400.0)
RBC: 4.99 Mil/uL (ref 4.22–5.81)
RDW: 14.5 % (ref 11.5–15.5)
WBC: 5.6 10*3/uL (ref 4.0–10.5)

## 2019-11-03 LAB — LIPID PANEL
Cholesterol: 97 mg/dL (ref 0–200)
HDL: 35.4 mg/dL — ABNORMAL LOW (ref >39.00)
LDL Cholesterol: 51 mg/dL (ref 0–99)
NonHDL: 61.18
Total CHOL/HDL Ratio: 3
Triglycerides: 49 mg/dL (ref 0.0–149.0)
VLDL: 9.8 mg/dL (ref 0.0–40.0)

## 2019-11-03 LAB — HEMOGLOBIN A1C: Hgb A1c MFr Bld: 6.1 % (ref 4.6–6.5)

## 2019-11-03 LAB — TSH: TSH: 0.92 u[IU]/mL (ref 0.35–4.50)

## 2019-11-03 NOTE — Assessment & Plan Note (Signed)
Check lipid panel  Continue daily statin Regular exercise and healthy diet encouraged  

## 2019-11-03 NOTE — Assessment & Plan Note (Signed)
No concerning symptoms Following with cardiology Continue current medications 

## 2019-11-03 NOTE — Assessment & Plan Note (Signed)
Clinically euthyroid Check tsh  Titrate med dose if needed  

## 2019-11-03 NOTE — Assessment & Plan Note (Signed)
Check a1c Low sugar / carb diet Stressed regular exercise   

## 2019-11-03 NOTE — Assessment & Plan Note (Signed)
BP well controlled Current regimen effective and well tolerated Continue current medications at current doses cmp  

## 2019-11-03 NOTE — Assessment & Plan Note (Signed)
Medical problems stable for pre-op Will see cardiology prior to surgery Labs today Will send in form - ok for surgery in January

## 2019-11-03 NOTE — Assessment & Plan Note (Signed)
Taking ambien prn Using bipap nightly

## 2019-11-04 ENCOUNTER — Other Ambulatory Visit: Payer: Self-pay | Admitting: Internal Medicine

## 2019-12-08 DIAGNOSIS — R972 Elevated prostate specific antigen [PSA]: Secondary | ICD-10-CM | POA: Diagnosis not present

## 2019-12-13 ENCOUNTER — Other Ambulatory Visit: Payer: Self-pay | Admitting: Cardiology

## 2019-12-15 ENCOUNTER — Other Ambulatory Visit: Payer: Self-pay | Admitting: Urology

## 2019-12-15 ENCOUNTER — Other Ambulatory Visit (HOSPITAL_COMMUNITY): Payer: Self-pay | Admitting: Urology

## 2019-12-15 DIAGNOSIS — R972 Elevated prostate specific antigen [PSA]: Secondary | ICD-10-CM

## 2019-12-26 ENCOUNTER — Encounter (HOSPITAL_COMMUNITY): Payer: Self-pay

## 2019-12-26 NOTE — Patient Instructions (Addendum)
DUE TO COVID-19 ONLY ONE VISITOR IS ALLOWED TO COME WITH YOU AND STAY IN THE WAITING ROOM ONLY DURING PRE OP AND PROCEDURE. THE ONE VISITOR MAY VISIT WITH YOU IN YOUR PRIVATE ROOM DURING VISITING HOURS ONLY!!   COVID SWAB TESTING MUST BE COMPLETED ON:  Saturday, Jan 2. 2021 at  12:15 PM [address], [place] -[facility] enter pre surgical testing line (Must self quarantine after testing. Follow instructions on handout.)             Your procedure is scheduled on: Wednesday, Jan. 6, 2021   Report to [facility] Main  Entrance    Report to admitting at 9:15 AM   Call this number if you have problems the morning of surgery [phone]   Bring BiPap mask and tubing day of surgery   Do not eat food:After Midnight.   May have liquids until 8:45 AM day of surgery   CLEAR LIQUID DIET  Foods Allowed                                                                     Foods Excluded  Water, Black Coffee and tea, regular and decaf                             liquids that you cannot  Plain Jell-O in any flavor  (No red)                                           see through such as: Fruit ices (not with fruit pulp)                                     milk, soups, orange juice  Iced Popsicles (No red)                                    All solid food Carbonated beverages, regular and diet                                    Apple juices Sports drinks like Gatorade (No red) Lightly seasoned clear broth or consume(fat free) Sugar, honey syrup  Sample Menu Breakfast                                Lunch                                     Supper Cranberry juice                    Beef broth  Chicken broth Jell-O                                     Grape juice                           Apple juice Coffee or tea                        Jell-O                                      Popsicle                                                 Coffee or tea                        Coffee or tea   Complete one Ensure drink the morning of surgery at 8:45 AM the day of surgery.   Brush your teeth the morning of surgery.   Do NOT smoke after Midnight   Take these medicines the morning of surgery with A SIP OF WATER: Levothyroxine, Metoprolol                               You may not have any metal on your body including jewelry, and body piercings             Do not wear lotions, powders, perfumes/cologne, or deodorant                          Men may shave face and neck.   Do not bring valuables to the hospital. [place].   Contacts, dentures or bridgework may not be worn into surgery.   Bring small overnight bag day of surgery.    Patients discharged the day of surgery will not be allowed to drive home.   Special Instructions: Bring a copy of your healthcare power of attorney and living will documents         the day of surgery if you haven't scanned them in before.              Please read over the following fact sheets you were given:  [facility] - Preparing for Surgery Before surgery, you can play an important role.  Because skin is not sterile, your skin needs to be as free of germs as possible.  You can reduce the number of germs on your skin by washing with CHG (chlorahexidine gluconate) soap before surgery.  CHG is an antiseptic cleaner which kills germs and bonds with the skin to continue killing germs even after washing. Please DO NOT use if you have an allergy to CHG or antibacterial soaps.  If your skin becomes reddened/irritated stop using the CHG and inform your nurse when you arrive at Short Stay. Do not shave (including legs and underarms) for at least 48 hours  prior to the first CHG shower.  You may shave your face/neck.  Please follow these instructions carefully:  1.  Shower with CHG Soap the night before surgery and the  morning of surgery.  2.  If you  choose to wash your hair, wash your hair first as usual with your normal  shampoo.  3.  After you shampoo, rinse your hair and body thoroughly to remove the shampoo.                             4.  Use CHG as you would any other liquid soap.  You can apply chg directly to the skin and wash.  Gently with a scrungie or clean washcloth.  5.  Apply the CHG Soap to your body ONLY FROM THE NECK DOWN.   Do   not use on face/ open                           Wound or open sores. Avoid contact with eyes, ears mouth and   genitals (private parts).                       Wash face,  Genitals (private parts) with your normal soap.             6.  Wash thoroughly, paying special attention to the area where your    surgery  will be performed.  7.  Thoroughly rinse your body with warm water from the neck down.  8.  DO NOT shower/wash with your normal soap after using and rinsing off the CHG Soap.                9.  Pat yourself dry with a clean towel.            10.  Wear clean pajamas.            11.  Place clean sheets on your bed the night of your first shower and do not  sleep with pets. Day of Surgery : Do not apply any lotions/deodorants the morning of surgery.  Please wear clean clothes to the hospital/surgery center.  FAILURE TO FOLLOW THESE INSTRUCTIONS MAY RESULT IN THE CANCELLATION OF YOUR SURGERY  PATIENT SIGNATURE_________________________________  NURSE SIGNATURE__________________________________  ________________________________________________________________________   [name]  An incentive spirometer is a tool that can help keep your lungs clear and active. This tool measures how well you are filling your lungs with each breath. Taking long deep breaths may help reverse or decrease the chance of developing breathing (pulmonary) problems (especially infection) following:  A long period of time when you are unable to move or be active. BEFORE THE PROCEDURE   If the spirometer  includes an indicator to show your best effort, your nurse or respiratory therapist will set it to a desired goal.  If possible, sit up straight or lean slightly forward. Try not to slouch.  Hold the incentive spirometer in an upright position. INSTRUCTIONS FOR USE  1. Sit on the edge of your bed if possible, or sit up as far as you can in bed or on a chair. 2. Hold the incentive spirometer in an upright position. 3. Breathe out normally. 4. Place the mouthpiece in your mouth and seal your lips tightly around it. 5. Breathe in slowly and as deeply as possible, raising the piston or the ball toward  the top of the column. 6. Hold your breath for 3-5 seconds or for as long as possible. Allow the piston or ball to fall to the bottom of the column. 7. Remove the mouthpiece from your mouth and breathe out normally. 8. Rest for a few seconds and repeat Steps 1 through 7 at least 10 times every 1-2 hours when you are awake. Take your time and take a few normal breaths between deep breaths. 9. The spirometer may include an indicator to show your best effort. Use the indicator as a goal to work toward during each repetition. 10. After each set of 10 deep breaths, practice coughing to be sure your lungs are clear. If you have an incision (the cut made at the time of surgery), support your incision when coughing by placing a pillow or rolled up towels firmly against it. Once you are able to get out of bed, walk around indoors and cough well. You may stop using the incentive spirometer when instructed by your caregiver.  RISKS AND COMPLICATIONS  Take your time so you do not get dizzy or light-headed.  If you are in pain, you may need to take or ask for pain medication before doing incentive spirometry. It is harder to take a deep breath if you are having pain. AFTER USE  Rest and breathe slowly and easily.  It can be helpful to keep track of a log of your progress. Your caregiver can provide you with a  simple table to help with this. If you are using the spirometer at home, follow these instructions: [place] IF:   You are having difficultly using the spirometer.  You have trouble using the spirometer as often as instructed.  Your pain medication is not giving enough relief while using the spirometer.  You develop fever of 100.5 F (38.1 C) or higher. SEEK IMMEDIATE MEDICAL CARE IF:   You cough up bloody sputum that had not been present before.  You develop fever of 102 F (38.9 C) or greater.  You develop worsening pain at or near the incision site. MAKE SURE YOU:   Understand these instructions.  Will watch your condition.  Will get help right away if you are not doing well or get worse. Document Released: 04/27/2007 Document Revised: 03/08/2012 Document Reviewed: 06/28/2007 ExitCare Patient Information 2014 Exit[employer], [place].   ________________________________________________________________________  WHAT IS A BLOOD TRANSFUSION? Blood Transfusion Information  A transfusion is the replacement of blood or some of its parts. Blood is made up of multiple cells which provide different functions.  Red blood cells carry oxygen and are used for blood loss replacement.  White blood cells fight against infection.  Platelets control bleeding.  Plasma helps clot blood.  Other blood products are available for specialized needs, such as hemophilia or other clotting disorders. BEFORE THE TRANSFUSION  Who gives blood for transfusions?   Healthy volunteers who are fully evaluated to make sure their blood is safe. This is blood bank blood. Transfusion therapy is the safest it has ever been in the practice of medicine. Before blood is taken from a donor, a complete history is taken to make sure that person has no history of diseases nor engages in risky social behavior (examples are intravenous drug use or sexual activity with multiple partners). The donor's travel history  is screened to minimize risk of transmitting infections, such as malaria. The donated blood is tested for signs of infectious diseases, such as HIV and hepatitis. The blood is then  tested to be sure it is compatible with you in order to minimize the chance of a transfusion reaction. If you or a relative donates blood, this is often done in anticipation of surgery and is not appropriate for emergency situations. It takes many days to process the donated blood. RISKS AND COMPLICATIONS Although transfusion therapy is very safe and saves many lives, the main dangers of transfusion include:   Getting an infectious disease.  Developing a transfusion reaction. This is an allergic reaction to something in the blood you were given. Every precaution is taken to prevent this. The decision to have a blood transfusion has been considered carefully by your caregiver before blood is given. Blood is not given unless the benefits outweigh the risks. AFTER THE TRANSFUSION  Right after receiving a blood transfusion, you will usually feel much better and more energetic. This is especially true if your red blood cells have gotten low (anemic). The transfusion raises the level of the red blood cells which carry oxygen, and this usually causes an energy increase.  The nurse administering the transfusion will monitor you carefully for complications. HOME CARE INSTRUCTIONS  No special instructions are needed after a transfusion. You may find your energy is better. Speak with your caregiver about any limitations on activity for underlying diseases you may have. SEEK MEDICAL CARE IF:   Your condition is not improving after your transfusion.  You develop redness or irritation at the intravenous (IV) site. SEEK IMMEDIATE MEDICAL CARE IF:  Any of the following symptoms occur over the next 12 hours:  Shaking chills.  You have a temperature by mouth above 102 F (38.9 C), not controlled by medicine.  Chest, back, or  muscle pain.  People around you feel you are not acting correctly or are confused.  Shortness of breath or difficulty breathing.  Dizziness and fainting.  You get a rash or develop hives.  You have a decrease in urine output.  Your urine turns a dark color or changes to pink, red, or brown. Any of the following symptoms occur over the next 10 days:  You have a temperature by mouth above 102 F (38.9 C), not controlled by medicine.  Shortness of breath.  Weakness after normal activity.  The white part of the eye turns yellow (jaundice).  You have a decrease in the amount of urine or are urinating less often.  Your urine turns a dark color or changes to pink, red, or brown. Document Released: 12/12/2000 Document Revised: 03/08/2012 Document Reviewed: 07/31/2008 [facility] Patient Information 2014 [place], [place].  _______________________________________________________________________

## 2019-12-27 ENCOUNTER — Encounter (HOSPITAL_COMMUNITY)
Admission: RE | Admit: 2019-12-27 | Discharge: 2019-12-27 | Disposition: A | Discharge status: Home / Self Care | Payer: Medicare Other | Source: Ambulatory Visit | Attending: Orthopedic Surgery | Admitting: Orthopedic Surgery

## 2019-12-27 ENCOUNTER — Encounter (HOSPITAL_COMMUNITY): Payer: Self-pay

## 2019-12-27 ENCOUNTER — Other Ambulatory Visit: Payer: Self-pay

## 2019-12-27 HISTORY — DX: Personal history of urinary calculi: Z87.442

## 2019-12-27 HISTORY — DX: Hyperlipidemia, unspecified: E78.5

## 2019-12-27 HISTORY — DX: Unspecified osteoarthritis, unspecified site: M19.90

## 2019-12-27 HISTORY — DX: Heartburn: R12

## 2019-12-27 HISTORY — DX: Gastro-esophageal reflux disease without esophagitis: K21.9

## 2019-12-27 HISTORY — DX: Obstructive sleep apnea (adult) (pediatric): G47.33

## 2019-12-27 HISTORY — DX: Umbilical hernia without obstruction or gangrene: K42.9

## 2019-12-27 HISTORY — DX: Prediabetes: R73.03

## 2019-12-27 HISTORY — DX: Ventral hernia without obstruction or gangrene: K43.9

## 2019-12-27 NOTE — Progress Notes (Signed)
PCP - Dr. Jenness Corner last office visit and clearance note 11/03/2019 in epic Cardiologist - Dr. Ashok Norris last office visit 10/11/2019, clearance note telephone encounter 10/31/2019 in epic  Chest x-ray - 12/30/2018 in epic EKG - 12/31/2018 in epic Stress Test - 10/06/2018 in epic ECHO - 12/31/2018 in epic Cardiac Cath - 12/30/2018 in epic  Sleep Study - 02/23/2019 in epic BiPAP - Yes  Fasting Blood Sugar - n/a Checks Blood Sugar ___n/a__ times a day  Blood Thinner Instructions: Brilinta last dose 12/29/2019 per pt. Aspirin Instructions: Yes Last Dose: per Dr. Radford Pax continue aspirin, will not stop for surgery  Anesthesia review: MI 12/30/2018, CAD, HTN  Patient denies shortness of breath, fever, cough and chest pain at PAT appointment   Patient verbalized understanding of instructions that were given to them at the PAT appointment. Patient was also instructed that they will need to review over the PAT instructions again at home before surgery.

## 2019-12-29 ENCOUNTER — Telehealth (HOSPITAL_COMMUNITY): Payer: Self-pay | Admitting: Urology

## 2019-12-29 ENCOUNTER — Ambulatory Visit (HOSPITAL_COMMUNITY): Admission: RE | Admit: 2019-12-29 | Payer: Medicare Other | Source: Ambulatory Visit

## 2019-12-29 ENCOUNTER — Other Ambulatory Visit: Payer: Self-pay

## 2019-12-29 ENCOUNTER — Encounter (HOSPITAL_COMMUNITY): Payer: Self-pay

## 2019-12-29 ENCOUNTER — Encounter (HOSPITAL_COMMUNITY)
Admission: RE | Admit: 2019-12-29 | Discharge: 2019-12-29 | Disposition: A | Discharge status: Home / Self Care | Payer: Medicare Other | Source: Ambulatory Visit | Attending: Orthopedic Surgery | Admitting: Orthopedic Surgery

## 2019-12-29 DIAGNOSIS — E039 Hypothyroidism, unspecified: Secondary | ICD-10-CM | POA: Insufficient documentation

## 2019-12-29 DIAGNOSIS — Z79890 Hormone replacement therapy (postmenopausal): Secondary | ICD-10-CM | POA: Insufficient documentation

## 2019-12-29 DIAGNOSIS — Z6834 Body mass index (BMI) 34.0-34.9, adult: Secondary | ICD-10-CM | POA: Diagnosis not present

## 2019-12-29 DIAGNOSIS — G4733 Obstructive sleep apnea (adult) (pediatric): Secondary | ICD-10-CM | POA: Insufficient documentation

## 2019-12-29 DIAGNOSIS — Z96643 Presence of artificial hip joint, bilateral: Secondary | ICD-10-CM | POA: Insufficient documentation

## 2019-12-29 DIAGNOSIS — M1710 Unilateral primary osteoarthritis, unspecified knee: Secondary | ICD-10-CM | POA: Insufficient documentation

## 2019-12-29 DIAGNOSIS — Z01812 Encounter for preprocedural laboratory examination: Secondary | ICD-10-CM | POA: Diagnosis not present

## 2019-12-29 DIAGNOSIS — E669 Obesity, unspecified: Secondary | ICD-10-CM | POA: Insufficient documentation

## 2019-12-29 DIAGNOSIS — R7303 Prediabetes: Secondary | ICD-10-CM | POA: Diagnosis not present

## 2019-12-29 DIAGNOSIS — I10 Essential (primary) hypertension: Secondary | ICD-10-CM | POA: Insufficient documentation

## 2019-12-29 DIAGNOSIS — I480 Paroxysmal atrial fibrillation: Secondary | ICD-10-CM | POA: Insufficient documentation

## 2019-12-29 DIAGNOSIS — T84091A Other mechanical complication of internal left hip prosthesis, initial encounter: Secondary | ICD-10-CM | POA: Diagnosis not present

## 2019-12-29 DIAGNOSIS — Z96653 Presence of artificial knee joint, bilateral: Secondary | ICD-10-CM | POA: Insufficient documentation

## 2019-12-29 DIAGNOSIS — E785 Hyperlipidemia, unspecified: Secondary | ICD-10-CM | POA: Diagnosis not present

## 2019-12-29 DIAGNOSIS — Z79899 Other long term (current) drug therapy: Secondary | ICD-10-CM | POA: Diagnosis not present

## 2019-12-29 DIAGNOSIS — Z7982 Long term (current) use of aspirin: Secondary | ICD-10-CM | POA: Insufficient documentation

## 2019-12-29 DIAGNOSIS — I2510 Atherosclerotic heart disease of native coronary artery without angina pectoris: Secondary | ICD-10-CM | POA: Insufficient documentation

## 2019-12-29 DIAGNOSIS — I252 Old myocardial infarction: Secondary | ICD-10-CM | POA: Insufficient documentation

## 2019-12-29 LAB — CBC
HCT: 44 % (ref 39.0–52.0)
Hemoglobin: 14.5 g/dL (ref 13.0–17.0)
MCH: 28.6 pg (ref 26.0–34.0)
MCHC: 33 g/dL (ref 30.0–36.0)
MCV: 86.8 fL (ref 80.0–100.0)
Platelets: 158 10*3/uL (ref 150–400)
RBC: 5.07 MIL/uL (ref 4.22–5.81)
RDW: 13.3 % (ref 11.5–15.5)
WBC: 5.3 10*3/uL (ref 4.0–10.5)
nRBC: 0 % (ref 0.0–0.2)

## 2019-12-29 LAB — COMPREHENSIVE METABOLIC PANEL
ALT: 92 U/L — ABNORMAL HIGH (ref 0–44)
AST: 62 U/L — ABNORMAL HIGH (ref 15–41)
Albumin: 4.2 g/dL (ref 3.5–5.0)
Alkaline Phosphatase: 103 U/L (ref 38–126)
Anion gap: 8 (ref 5–15)
BUN: 15 mg/dL (ref 8–23)
CO2: 26 mmol/L (ref 22–32)
Calcium: 9.4 mg/dL (ref 8.9–10.3)
Chloride: 107 mmol/L (ref 98–111)
Creatinine, Ser: 0.77 mg/dL (ref 0.61–1.24)
GFR calc Af Amer: >60 mL/min (ref >60)
GFR calc non Af Amer: >60 mL/min (ref >60)
Glucose, Bld: 107 mg/dL — ABNORMAL HIGH (ref 70–99)
Potassium: 4.6 mmol/L (ref 3.5–5.1)
Sodium: 141 mmol/L (ref 135–145)
Total Bilirubin: 1.1 mg/dL (ref 0.3–1.2)
Total Protein: 8 g/dL (ref 6.5–8.1)

## 2019-12-29 LAB — APTT: aPTT: 33 seconds (ref 24–36)

## 2019-12-29 LAB — PROTIME-INR
INR: 1.1 (ref 0.8–1.2)
Prothrombin Time: 13.7 seconds (ref 11.4–15.2)

## 2019-12-29 LAB — HEMOGLOBIN A1C
Hgb A1c MFr Bld: 5.6 % (ref 4.8–5.6)
Mean Plasma Glucose: 114.02 mg/dL

## 2019-12-29 LAB — SURGICAL PCR SCREEN
MRSA, PCR: NEGATIVE
Staphylococcus aureus: NEGATIVE

## 2019-12-29 NOTE — Anesthesia Preprocedure Evaluation (Addendum)
Anesthesia Evaluation  Patient identified by MRN, date of birth, ID band Patient awake    Reviewed: Allergy & Precautions, NPO status , Patient's Chart, lab work & pertinent test results  Airway Mallampati: II  TM Distance: >3 FB Neck ROM: Full    Dental  (+) Teeth Intact, Dental Advisory Given   Pulmonary sleep apnea and Continuous Positive Airway Pressure Ventilation ,    Pulmonary exam normal breath sounds clear to auscultation       Cardiovascular hypertension, + angina + CAD, + Past MI and + Cardiac Stents  Normal cardiovascular exam+ dysrhythmias Atrial Fibrillation  Rhythm:Regular Rate:Normal     Neuro/Psych negative neurological ROS     GI/Hepatic negative GI ROS, Neg liver ROS,   Endo/Other  Hypothyroidism Obesity   Renal/GU negative Renal ROS     Musculoskeletal negative musculoskeletal ROS (+) Arthritis ,  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  Reproductive/Obstetrics                            Anesthesia Physical Anesthesia Plan  ASA: III  Anesthesia Plan: Spinal   Post-op Pain Management:    Induction: Intravenous  PONV Risk Score and Plan: 1 and Propofol infusion and Treatment may vary due to age or medical condition  Airway Management Planned: Natural Airway and Nasal Cannula  Additional Equipment:   Intra-op Plan:   Post-operative Plan:   Informed Consent: I have reviewed the patients History and Physical, chart, labs and discussed the procedure including the risks, benefits and alternatives for the proposed anesthesia with the patient or authorized representative who has indicated his/her understanding and acceptance.     Dental advisory given  Plan Discussed with: CRNA,  Anesthesiologist and Surgeon  Anesthesia Plan Comments: (See PAT note 12/29/2019, Konrad Felix, PA-C)       Anesthesia Quick Evaluation

## 2019-12-29 NOTE — Telephone Encounter (Signed)
12/29/19~LM on patient and vm ref rschd appt, due to [facility] unable to accommodate today's appt due to availability, per Aaron/[facility] dept. Also called mobile #, got fast busy signal multiple times. MF

## 2019-12-29 NOTE — Progress Notes (Signed)
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 never smoker with h/o GERD, HTN, hypothyroidism, HLD, OSA w/BiPAP, PAF, CAD (MI 12/2018), failed right total hip arthroplasty scheduled for above procedure 01/04/2020 with Dr. Gaynelle Arabian.   Pt last seen by PCP, Dr. Billey Gosling, 11/03/2019.  Per OV note, "ok for surgery in January."  Cleared by cardiology 10/31/2019.  Per Almyra Deforest, PA-C, "[name] was last seen on 10/11/2019 by Dr. Radford Pax.  Since that day, [name] has done well without significant chest pain or worsening dyspnea. Therefore, based on ACC/AHA guidelines, the patient would be at acceptable risk for the planned procedure without further cardiovascular testing.  I will route this recommendation to the requesting party via Epic fax function and remove from pre-op pool. Please call with questions. Per Dr. Radford Pax, ok to hold Brilinta for 5 days, patient was given a prescription for plavix to replace Brilinta after surgery at the discretion of the surgeon based on bleeding risk. Per Dr. Radford Pax, recommend continue aspirin 81 mg daily through the surgery."  Last dose of Brilinta 12/29/2019.   Anticipate pt can proceed with planned procedure barring acute status change.    VS: BP (!) 146/84 (BP Location: Left Arm)   Pulse 60   Temp 36.8 C (Oral)   Resp 16   Ht 6' (1.829 m)   Wt 117 kg   SpO2 96%   BMI 34.99 kg/m   PROVIDERS: Binnie Rail, MD is PCP   Fransico Him, MD is Cardiologist  LABS: Labs reviewed: Acceptable for surgery. (all labs ordered are listed, but only abnormal results are displayed)  Labs Reviewed  COMPREHENSIVE METABOLIC PANEL - Abnormal;  Notable for the following components:      Result Value   Glucose, Bld 107 (*)    AST 62 (*)    ALT 92 (*)    All other components within normal limits  SURGICAL PCR SCREEN  APTT  CBC  PROTIME-INR  HEMOGLOBIN A1C  TYPE AND SCREEN     IMAGES:   EKG: 12/31/2018 Rate 50 bpm  Sinus bradycardia  CV: Echo 12/31/2018 Study Conclusions  - Left ventricle: The cavity size was normal. Wall thickness was   increased in a pattern of mild LVH. Systolic function was   vigorous. The estimated ejection fraction was in the range of 65%   to 70%. Wall motion was normal; there were no regional wall   motion abnormalities. Doppler parameters are consistent with   abnormal left ventricular relaxation (grade 1 diastolic   dysfunction). - Ventricular septum: The contour showed systolic flattening. - Mitral valve: There was trivial regurgitation. - Left atrium: The atrium was mildly dilated.  ETT 10/06/2018   Blood pressure demonstrated a normal response to exercise.  Normal sinus rhythm with no ST segment changes.  7 minutes of exercise, no chest pain  Overall low risk exercise treadmill test with no electrocardiographic evidence of ischemia Past Medical History:  Diagnosis Date  . BPH (benign prostatic hypertrophy)   . COLONIC POLYPS, HX OF 2007   clear colo w/o polyps 04/2015: 71yr follow up  . Coronary artery disease   . GERD (gastroesophageal  reflux disease)   . Heartburn   . History of kidney stones    passed  . Hyperlipidemia   . HYPERTENSION   . HYPOTHYROIDISM   . Myocardial infarction ([place]) 12/2018   Inferior STEMI  . OA (osteoarthritis)    Knees  . OBESITY   . OSA treated with BiPAP   . PAF (paroxysmal atrial fibrillation) ([place])   . Pre-diabetes   . Umbilical hernia   . Ventral hernia     Past Surgical History:  Procedure Laterality Date  . arthroscopic knee surgery     (R) 2003 & (L) 2010  . CARDIAC CATHETERIZATION    . COLONOSCOPY  04/2015   no polyps  ([place])  . CORONARY/GRAFT ACUTE MI REVASCULARIZATION N/A 12/30/2018   Procedure: Coronary/Graft Acute MI Revascularization;  Surgeon: Belva Crome, MD;  Location: [facility];  Service: Cardiovascular;  Laterality: N/A;  . LAMINECTOMY  1991  . LEFT HEART CATH AND CORONARY ANGIOGRAPHY N/A 12/30/2018   Procedure: LEFT HEART CATH AND CORONARY ANGIOGRAPHY;  Surgeon: Belva Crome, MD;  Location: [facility];  Service: Cardiovascular;  Laterality: N/A;  . TONSILLECTOMY  1990   w/ adenoids and uvula  . TOTAL HIP ARTHROPLASTY Right 01/2011   [place]  . TOTAL HIP ARTHROPLASTY Left 04/16/2016   Procedure: TOTAL HIP ARTHROPLASTY ANTERIOR APPROACH;  Surgeon: Gaynelle Arabian, MD;  [facility];  Service: Orthopedics;  Laterality: Left;  . TOTAL KNEE ARTHROPLASTY  12/27/2012   Procedure: TOTAL KNEE BILATERAL;  Surgeon: Gearlean Alf, MD;  Location[facility];  Service: Orthopedics;  Laterality: Bilateral;    MEDICATIONS: . acetaminophen (TYLENOL) 325 MG tablet  . aspirin 81 MG chewable tablet  . atorvastatin (LIPITOR) 80 MG tablet  . [START ON 12/31/2019] clopidogrel (PLAVIX) 75 MG tablet  . levothyroxine (SYNTHROID) 150 MCG tablet  . losartan (COZAAR) 50 MG tablet  . metoprolol succinate (TOPROL-XL) 25 MG 24 hr tablet  . Multiple Vitamins-Minerals (MULTIVITAMIN) tablet  . nitroGLYCERIN (NITROSTAT) 0.4 MG SL tablet  . ticagrelor (BRILINTA) 90 MG TABS tablet  . zolpidem (AMBIEN) 5 MG tablet   No current facility-administered medications for this encounter.     Maia Plan [facility]Surgical Testing [phone] 12/29/19  1:29 PM

## 2019-12-31 ENCOUNTER — Other Ambulatory Visit (HOSPITAL_COMMUNITY)
Admission: RE | Admit: 2019-12-31 | Discharge: 2019-12-31 | Disposition: A | Discharge status: Home / Self Care | Payer: Medicare Other | Source: Ambulatory Visit | Attending: Orthopedic Surgery | Admitting: Orthopedic Surgery

## 2019-12-31 DIAGNOSIS — Z20822 Contact with and (suspected) exposure to covid-19: Secondary | ICD-10-CM | POA: Insufficient documentation

## 2019-12-31 DIAGNOSIS — Z01812 Encounter for preprocedural laboratory examination: Secondary | ICD-10-CM | POA: Diagnosis not present

## 2020-01-02 LAB — NOVEL CORONAVIRUS, NAA (HOSP ORDER, SEND-OUT TO REF LAB; TAT 18-24 HRS): SARS-CoV-2, NAA: NOT DETECTED

## 2020-01-02 NOTE — H&P (Signed)
TOTAL HIP REVISION ADMISSION H&P  Patient is admitted for right revision total hip arthroplasty.  Subjective:  Chief Complaint: right hip pain  HPI: [name], 69 y.o. male, has a history of pain and functional disability in the right hip due to arthritis and patient has failed non-surgical conservative treatments for greater than 12 weeks to include flexibility and strengthening excercises and activity modification. The indications for the revision total hip arthroplasty are bearing surface wear leading to  symptomatic synovitis.  Onset of symptoms was gradual starting 5 years ago with gradually worsening course since that time.  Prior procedures on the right hip include arthroplasty.  Patient currently rates pain in the right hip at 2 out of 10 with activity.  There is pain that interfers with activities of daily living. Patient has no evidence of prosthetic loosening by imaging studies.  This condition presents safety issues increasing the risk of falls.  There is no current active infection.  Patient Active Problem List   Diagnosis Date Noted  . Preop examination 11/02/2019  . OSA treated with BiPAP 05/03/2019  . CAD S/P percutaneous coronary angioplasty 01/11/2019  . CAD (coronary artery disease), native coronary artery 01/11/2019  . HLD (hyperlipidemia) 01/11/2019  . Acute ST elevation myocardial infarction (STEMI) of inferior wall ([place]) 12/30/2018  . ST elevation myocardial infarction (STEMI) ([place]) 12/30/2018  . 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 Rash and nonspecific skin eruption 02/19/2018  . Sleep disorder 07/02/2017  . Prediabetes 05/11/2017  . Ventral hernia without obstruction or gangrene 05/11/2017  . Umbilical hernia without obstruction or gangrene 05/11/2017  . OA (osteoarthritis) of hip 04/16/2016  . Pre-syncope 02/22/2016  . Vertigo 02/22/2016  . Knee joint replacement status 01/12/2013  .  OA (osteoarthritis) of knee 12/27/2012  . Benign prostatic hyperplasia   . Hypothyroidism 09/04/2009  . Obesity 09/04/2009  . Essential hypertension 09/04/2009  . ARTHRITIS 09/04/2009  . COLONIC POLYPS, HX OF 09/04/2009   Past Medical History:  Diagnosis Date  . BPH (benign prostatic hypertrophy)   . COLONIC POLYPS, HX OF 2007   clear colo w/o polyps 04/2015: 56yr follow up  . Coronary artery disease   . GERD (gastroesophageal reflux disease)   . Heartburn   . History of kidney stones    passed  . Hyperlipidemia   . HYPERTENSION   . HYPOTHYROIDISM   . Myocardial infarction ([place]) 12/2018   Inferior STEMI  . OA (osteoarthritis)    Knees  . OBESITY   . OSA treated with BiPAP   . PAF (paroxysmal atrial fibrillation) ([place])   . Pre-diabetes   . Umbilical hernia   . Ventral hernia     Past Surgical History:  Procedure Laterality Date  . arthroscopic knee surgery     (R) 2003 & (L) 2010  . CARDIAC CATHETERIZATION    . COLONOSCOPY  04/2015   no polyps ([place])  . CORONARY/GRAFT ACUTE MI REVASCULARIZATION N/A 12/30/2018   Procedure: Coronary/Graft Acute MI Revascularization;  Surgeon: Belva Crome, MD;  Location: [facility];  Service: Cardiovascular;  Laterality: N/A;  . LAMINECTOMY  1991  . LEFT HEART CATH AND CORONARY ANGIOGRAPHY N/A 12/30/2018   Procedure: LEFT HEART CATH AND CORONARY ANGIOGRAPHY;  Surgeon: Belva Crome, MD;  Location: [facility];  Service: Cardiovascular;  Laterality: N/A;  . TONSILLECTOMY  1990   w/ adenoids and uvula  . TOTAL HIP ARTHROPLASTY Right 01/2011  [place]  . TOTAL HIP ARTHROPLASTY Left 04/16/2016   Procedure: TOTAL HIP ARTHROPLASTY ANTERIOR APPROACH;  Surgeon: Gaynelle Arabian, MD;  [facility];  Service: Orthopedics;  Laterality: Left;  . TOTAL KNEE ARTHROPLASTY  12/27/2012   Procedure: TOTAL KNEE BILATERAL;  Surgeon: Gearlean Alf, MD;  [facility];  Service: Orthopedics;  Laterality: Bilateral;       Current  Outpatient Medications  Medication Sig Dispense Refill Last Dose  . acetaminophen (TYLENOL) 325 MG tablet Take 2 tablets (650 mg total) by mouth every 4 (four) hours as needed for headache or mild pain.     Marland Kitchen aspirin 81 MG chewable tablet Chew 1 tablet (81 mg total) by mouth daily.     Marland Kitchen atorvastatin (LIPITOR) 80 MG tablet TAKE 1 TABLET (80 MG TOTAL) BY MOUTH DAILY AT 6 PM. 90 tablet 3   . levothyroxine (SYNTHROID) 150 MCG tablet TAKE 1 TABLET BY MOUTH EVERY DAY (Patient taking differently: Take 150 mcg by mouth daily before breakfast. ) 90 tablet 1   . losartan (COZAAR) 50 MG tablet TAKE 1 TABLET BY MOUTH EVERY DAY (Patient taking differently: Take by mouth daily. ) 90 tablet 3   . metoprolol succinate (TOPROL-XL) 25 MG 24 hr tablet TAKE 1 TABLET BY MOUTH EVERY DAY (Patient taking differently: Take 25 mg by mouth daily. ) 90 tablet 3   . Multiple Vitamins-Minerals (MULTIVITAMIN) tablet Take 1 tablet by mouth daily.     . nitroGLYCERIN (NITROSTAT) 0.4 MG SL tablet Place 1 tablet (0.4 mg total) under the tongue every 5 (five) minutes as needed for chest pain. 25 tablet 5   . zolpidem (AMBIEN) 5 MG tablet Take 1 tablet (5 mg total) by mouth at bedtime as needed for sleep (For Insomina). 20 tablet 3   . clopidogrel (PLAVIX) 75 MG tablet Take 1 tablet (75 mg total) by mouth daily. 90 tablet 3    No Known Allergies  Social History   Tobacco Use  . Smoking status: Never Smoker  . Smokeless tobacco: Never Used  Substance Use Topics  . Alcohol use: Yes    Alcohol/week: 0.0 standard drinks    Comment: rarely, social    Family History  Problem Relation Age of Onset  . Heart disease Father 78       CABG age 66  . Hypertension Father   . Diabetes Father   . Dementia Mother        ?ALS vs SNP  . ALS Maternal Grandfather   . Arthritis Other        Parent & grandparents  . Colon cancer Neg Hx       Review of Systems  Constitutional: Negative.   HENT: Negative.   Eyes: Negative.    Respiratory: Negative.   Cardiovascular: Negative.   Gastrointestinal: Negative.   Endocrine: Negative.   Genitourinary: Negative.   Musculoskeletal: Positive for arthralgias and joint swelling. Negative for back pain, gait problem, myalgias, neck pain and neck stiffness.  Skin: Negative.   Allergic/Immunologic: Negative.   Neurological: Negative.   Hematological: Negative.   Psychiatric/Behavioral: Negative.     Objective:  Physical Exam  Constitutional: He is oriented to person, place, and time. He appears well-developed. No distress.  Obese  HENT:  Head: Normocephalic and atraumatic.  Right Ear: External ear normal.  Left Ear: External ear normal.  Nose: Nose normal.  Mouth/Throat: Oropharynx is clear and moist.  Eyes: Conjunctivae and EOM are normal.  Cardiovascular: Normal rate, regular rhythm, normal heart  sounds and intact distal pulses.  No murmur heard. Respiratory: Effort normal and breath sounds normal. No respiratory distress. He has no wheezes.  GI: He exhibits no distension. There is no abdominal tenderness.  Musculoskeletal:     Cervical back: Normal range of motion and neck supple.     Comments: Right Hip Exam: ROM: Flexion to 110, Internal Rotation 20, External Rotation 30, and Abduction 30 without discomfort. There is no tenderness over the greater trochanter bursa.  Neurological: He is alert and oriented to person, place, and time. He has normal strength. No sensory deficit.  Skin: No rash noted. He is not diaphoretic. No erythema.  Psychiatric: He has a normal mood and affect. His behavior is normal.    Vitals  Ht: 6 ft  Wt: 250 lbs  BMI: 33.9 BP: 124/70 sitting L ar Pulse: 72 bpm regular   Imaging Review:  Plain radiographs demonstrate no degenerative joint disease of the right hip(s). There is no evidence of loosening of the acetabular cup and femoral stem. MRI of the right hip shows a large fluid collection superior and lateral to the hip.  There is a small amount of bony destruction around the lesser trochanter.   Assessment/Plan:  End stage primary osteoarthritis, right hip(s) with failed previous arthroplasty.  The patient history, physical examination, clinical judgement of the provider and imaging studies are consistent with end stage degenerative joint disease of the right hip(s), previous total hip arthroplasty. Revision total hip arthroplasty is deemed medically necessary. The treatment options including medical management, injection therapy, arthroscopy and arthroplasty were discussed at length. The risks and benefits of total hip arthroplasty were presented and reviewed. The risks due to aseptic loosening, infection, stiffness, dislocation/subluxation,  thromboembolic complications and other imponderables were discussed.  The patient acknowledged the explanation, agreed to proceed with the plan and consent was signed. Patient is being admitted for inpatient treatment for surgery, pain control, PT, OT, prophylactic antibiotics, VTE prophylaxis, progressive ambulation and ADL's and discharge planning. The patient is planning to be discharged home.    Risks and benefits of the surgery were discussed with the patient and Dr.Aluisio at their previous office visit, and the patient has elected to move forward with the aforementioned surgery. Post-operative care plans were discussed with the patient today.  Therapy Plans: HEP Disposition: Home with wife Planned DVT prophylaxis: resume Plavix and aspirin 81mg  DME needed: has equipment PCP: Dr. Quay Burow- clearance received Cardio: Dr. Radford Pax- awaiting clearance Other: no anesthesia concerns  Instructed patient on meds to stop prior to surgery  Ardeen Jourdain, PA-C

## 2020-01-02 NOTE — Telephone Encounter (Signed)
Our office received another clearance form for the same surgery that we received clearance form 10/31/19. I left message for [name] looks like we received clearance form for the same surgery back from 10/31/19. Left message clearance was faxed over 10/31/19 @ 1:14 pm. If any questions please call [phone].

## 2020-01-03 NOTE — Progress Notes (Signed)
[name] made aware to arrive at 1:00PM for surgery and to drink pre surgery drink at 12:30PM. He verbalized understanding.

## 2020-01-04 ENCOUNTER — Inpatient Hospital Stay (HOSPITAL_COMMUNITY): Payer: Medicare Other | Admitting: Certified Registered Nurse Anesthetist

## 2020-01-04 ENCOUNTER — Inpatient Hospital Stay (HOSPITAL_COMMUNITY): Payer: Medicare Other

## 2020-01-04 ENCOUNTER — Inpatient Hospital Stay (HOSPITAL_COMMUNITY): Payer: Medicare Other | Admitting: Physician Assistant

## 2020-01-04 ENCOUNTER — Encounter (HOSPITAL_COMMUNITY)
Admission: RE | Disposition: A | Discharge status: Home / Self Care | Payer: Self-pay | Source: Ambulatory Visit | Attending: Orthopedic Surgery

## 2020-01-04 ENCOUNTER — Other Ambulatory Visit: Payer: Self-pay

## 2020-01-04 ENCOUNTER — Ambulatory Visit (HOSPITAL_COMMUNITY)
Admission: RE | Admit: 2020-01-04 | Discharge: 2020-01-04 | Disposition: A | Discharge status: Home / Self Care | Payer: Medicare Other | Source: Ambulatory Visit | Attending: Orthopedic Surgery | Admitting: Orthopedic Surgery

## 2020-01-04 ENCOUNTER — Telehealth (HOSPITAL_COMMUNITY): Payer: Self-pay | Admitting: *Deleted

## 2020-01-04 ENCOUNTER — Encounter (HOSPITAL_COMMUNITY): Payer: Self-pay | Admitting: Orthopedic Surgery

## 2020-01-04 DIAGNOSIS — Z833 Family history of diabetes mellitus: Secondary | ICD-10-CM | POA: Insufficient documentation

## 2020-01-04 DIAGNOSIS — Z79899 Other long term (current) drug therapy: Secondary | ICD-10-CM | POA: Insufficient documentation

## 2020-01-04 DIAGNOSIS — Z86010 Personal history of colonic polyps: Secondary | ICD-10-CM | POA: Insufficient documentation

## 2020-01-04 DIAGNOSIS — Z7982 Long term (current) use of aspirin: Secondary | ICD-10-CM | POA: Diagnosis not present

## 2020-01-04 DIAGNOSIS — M1611 Unilateral primary osteoarthritis, right hip: Secondary | ICD-10-CM | POA: Insufficient documentation

## 2020-01-04 DIAGNOSIS — T84090A Other mechanical complication of internal right hip prosthesis, initial encounter: Secondary | ICD-10-CM | POA: Insufficient documentation

## 2020-01-04 DIAGNOSIS — Z87442 Personal history of urinary calculi: Secondary | ICD-10-CM | POA: Diagnosis not present

## 2020-01-04 DIAGNOSIS — Z8261 Family history of arthritis: Secondary | ICD-10-CM | POA: Insufficient documentation

## 2020-01-04 DIAGNOSIS — Z96649 Presence of unspecified artificial hip joint: Secondary | ICD-10-CM | POA: Diagnosis present

## 2020-01-04 DIAGNOSIS — N400 Enlarged prostate without lower urinary tract symptoms: Secondary | ICD-10-CM | POA: Insufficient documentation

## 2020-01-04 DIAGNOSIS — I252 Old myocardial infarction: Secondary | ICD-10-CM | POA: Insufficient documentation

## 2020-01-04 DIAGNOSIS — I4891 Unspecified atrial fibrillation: Secondary | ICD-10-CM | POA: Insufficient documentation

## 2020-01-04 DIAGNOSIS — E039 Hypothyroidism, unspecified: Secondary | ICD-10-CM | POA: Insufficient documentation

## 2020-01-04 DIAGNOSIS — E785 Hyperlipidemia, unspecified: Secondary | ICD-10-CM | POA: Diagnosis not present

## 2020-01-04 DIAGNOSIS — Z96641 Presence of right artificial hip joint: Secondary | ICD-10-CM | POA: Diagnosis not present

## 2020-01-04 DIAGNOSIS — Z96643 Presence of artificial hip joint, bilateral: Secondary | ICD-10-CM | POA: Insufficient documentation

## 2020-01-04 DIAGNOSIS — Z8249 Family history of ischemic heart disease and other diseases of the circulatory system: Secondary | ICD-10-CM | POA: Insufficient documentation

## 2020-01-04 DIAGNOSIS — K219 Gastro-esophageal reflux disease without esophagitis: Secondary | ICD-10-CM | POA: Diagnosis not present

## 2020-01-04 DIAGNOSIS — M1990 Unspecified osteoarthritis, unspecified site: Secondary | ICD-10-CM | POA: Diagnosis not present

## 2020-01-04 DIAGNOSIS — T84018A Broken internal joint prosthesis, other site, initial encounter: Secondary | ICD-10-CM | POA: Diagnosis present

## 2020-01-04 DIAGNOSIS — Z6834 Body mass index (BMI) 34.0-34.9, adult: Secondary | ICD-10-CM | POA: Insufficient documentation

## 2020-01-04 DIAGNOSIS — T84010A Broken internal right hip prosthesis, initial encounter: Secondary | ICD-10-CM

## 2020-01-04 DIAGNOSIS — G4733 Obstructive sleep apnea (adult) (pediatric): Secondary | ICD-10-CM | POA: Insufficient documentation

## 2020-01-04 DIAGNOSIS — I10 Essential (primary) hypertension: Secondary | ICD-10-CM | POA: Insufficient documentation

## 2020-01-04 DIAGNOSIS — I480 Paroxysmal atrial fibrillation: Secondary | ICD-10-CM | POA: Diagnosis not present

## 2020-01-04 DIAGNOSIS — I25759 Atherosclerosis of native coronary artery of transplanted heart with unspecified angina pectoris: Secondary | ICD-10-CM | POA: Insufficient documentation

## 2020-01-04 DIAGNOSIS — Y792 Prosthetic and other implants, materials and accessory orthopedic devices associated with adverse incidents: Secondary | ICD-10-CM | POA: Insufficient documentation

## 2020-01-04 DIAGNOSIS — T84019A Broken internal joint prosthesis, unspecified site, initial encounter: Secondary | ICD-10-CM | POA: Insufficient documentation

## 2020-01-04 DIAGNOSIS — Z955 Presence of coronary angioplasty implant and graft: Secondary | ICD-10-CM | POA: Diagnosis not present

## 2020-01-04 DIAGNOSIS — Z941 Heart transplant status: Secondary | ICD-10-CM | POA: Diagnosis not present

## 2020-01-04 DIAGNOSIS — E669 Obesity, unspecified: Secondary | ICD-10-CM | POA: Insufficient documentation

## 2020-01-04 DIAGNOSIS — R7303 Prediabetes: Secondary | ICD-10-CM | POA: Diagnosis not present

## 2020-01-04 DIAGNOSIS — Z820 Family history of epilepsy and other diseases of the nervous system: Secondary | ICD-10-CM | POA: Insufficient documentation

## 2020-01-04 HISTORY — PX: TOTAL HIP REVISION: SHX763

## 2020-01-04 LAB — TYPE AND SCREEN
ABO/RH(D): O POS
Antibody Screen: NEGATIVE

## 2020-01-04 SURGERY — TOTAL HIP REVISION
Anesthesia: Spinal | Site: Hip | Laterality: Right

## 2020-01-04 MED ORDER — PROPOFOL 10 MG/ML IV BOLUS
INTRAVENOUS | Status: AC
  Filled 2020-01-04: qty 20

## 2020-01-04 MED ORDER — TRANEXAMIC ACID-NACL 1000-0.7 MG/100ML-% IV SOLN
1000.0000 mg | INTRAVENOUS | Status: AC
  Administered 2020-01-04: 1000 mg via INTRAVENOUS

## 2020-01-04 MED ORDER — METOPROLOL SUCCINATE ER 25 MG PO TB24: 25.0000 mg | ORAL_TABLET | Freq: Every day | ORAL | Status: DC

## 2020-01-04 MED ORDER — CLOPIDOGREL BISULFATE 75 MG PO TABS: 75.0000 mg | ORAL_TABLET | Freq: Every day | ORAL | Status: DC

## 2020-01-04 MED ORDER — TRANEXAMIC ACID-NACL 1000-0.7 MG/100ML-% IV SOLN
1000.0000 mg | Freq: Once | INTRAVENOUS | Status: AC
  Administered 2020-01-04: 17:00:00 1000 mg via INTRAVENOUS
  Filled 2020-01-04: qty 100

## 2020-01-04 MED ORDER — LACTATED RINGERS IV BOLUS
250.0000 mL | Freq: Once | INTRAVENOUS | Status: AC
  Administered 2020-01-04: 17:00:00 250 mL via INTRAVENOUS

## 2020-01-04 MED ORDER — METHOCARBAMOL 500 MG IVPB - SIMPLE MED
500.0000 mg | Freq: Four times a day (QID) | INTRAVENOUS | Status: DC | PRN
  Administered 2020-01-04: 17:00:00 500 mg via INTRAVENOUS

## 2020-01-04 MED ORDER — TRANEXAMIC ACID-NACL 1000-0.7 MG/100ML-% IV SOLN
INTRAVENOUS | Status: AC
  Filled 2020-01-04: qty 100

## 2020-01-04 MED ORDER — STERILE WATER FOR IRRIGATION IR SOLN
Status: DC | PRN
  Administered 2020-01-04: 1000 mL

## 2020-01-04 MED ORDER — FENTANYL CITRATE (PF) 100 MCG/2ML IJ SOLN
INTRAMUSCULAR | Status: DC | PRN
  Administered 2020-01-04: 100 ug via INTRAVENOUS

## 2020-01-04 MED ORDER — ACETAMINOPHEN 10 MG/ML IV SOLN
1000.0000 mg | Freq: Four times a day (QID) | INTRAVENOUS | Status: DC
  Administered 2020-01-04: 1000 mg via INTRAVENOUS
  Filled 2020-01-04: qty 100

## 2020-01-04 MED ORDER — ONDANSETRON HCL 4 MG PO TABS: 4.0000 mg | ORAL_TABLET | Freq: Four times a day (QID) | ORAL | Status: DC | PRN

## 2020-01-04 MED ORDER — ASPIRIN EC 325 MG PO TBEC: 325.0000 mg | DELAYED_RELEASE_TABLET | Freq: Every day | ORAL | Status: DC

## 2020-01-04 MED ORDER — DOCUSATE SODIUM 100 MG PO CAPS: 100.0000 mg | ORAL_CAPSULE | Freq: Two times a day (BID) | ORAL | Status: DC

## 2020-01-04 MED ORDER — LACTATED RINGERS IV BOLUS: 250.0000 mL | Freq: Once | INTRAVENOUS | Status: DC

## 2020-01-04 MED ORDER — MIDAZOLAM HCL 5 MG/5ML IJ SOLN
INTRAMUSCULAR | Status: DC | PRN
  Administered 2020-01-04: 2 mg via INTRAVENOUS

## 2020-01-04 MED ORDER — PROPOFOL 10 MG/ML IV BOLUS
INTRAVENOUS | Status: AC
  Filled 2020-01-04: qty 40

## 2020-01-04 MED ORDER — EPHEDRINE 5 MG/ML INJ
INTRAVENOUS | Status: AC
  Filled 2020-01-04: qty 20

## 2020-01-04 MED ORDER — DEXAMETHASONE SODIUM PHOSPHATE 10 MG/ML IJ SOLN
8.0000 mg | Freq: Once | INTRAMUSCULAR | Status: AC
  Administered 2020-01-04: 10 mg via INTRAVENOUS

## 2020-01-04 MED ORDER — CHLORHEXIDINE GLUCONATE 4 % EX LIQD
60.0000 mL | Freq: Once | CUTANEOUS | Status: AC
  Administered 2020-01-04: 4 via TOPICAL

## 2020-01-04 MED ORDER — LEVOTHYROXINE SODIUM 150 MCG PO TABS: 150.0000 ug | ORAL_TABLET | Freq: Every day | ORAL | Status: DC

## 2020-01-04 MED ORDER — METHOCARBAMOL 500 MG IVPB - SIMPLE MED
INTRAVENOUS | Status: AC
  Filled 2020-01-04: qty 50

## 2020-01-04 MED ORDER — LOSARTAN POTASSIUM 50 MG PO TABS
50.0000 mg | ORAL_TABLET | Freq: Every day | ORAL | Status: DC
  Administered 2020-01-04: 50 mg via ORAL

## 2020-01-04 MED ORDER — NITROGLYCERIN 0.4 MG SL SUBL: 0.4000 mg | SUBLINGUAL_TABLET | SUBLINGUAL | Status: DC | PRN

## 2020-01-04 MED ORDER — CEFAZOLIN SODIUM-DEXTROSE 2-4 GM/100ML-% IV SOLN
2.0000 g | INTRAVENOUS | Status: AC
  Administered 2020-01-04: 2 g via INTRAVENOUS

## 2020-01-04 MED ORDER — SODIUM CHLORIDE 0.9 % IV SOLN: INTRAVENOUS | Status: DC

## 2020-01-04 MED ORDER — ONDANSETRON HCL 4 MG/2ML IJ SOLN: 4.0000 mg | Freq: Once | INTRAMUSCULAR | Status: DC | PRN

## 2020-01-04 MED ORDER — DEXAMETHASONE SODIUM PHOSPHATE 10 MG/ML IJ SOLN: 10.0000 mg | Freq: Once | INTRAMUSCULAR | Status: DC

## 2020-01-04 MED ORDER — PROPOFOL 500 MG/50ML IV EMUL
INTRAVENOUS | Status: DC | PRN
  Administered 2020-01-04: 75 ug/kg/min via INTRAVENOUS

## 2020-01-04 MED ORDER — ACETAMINOPHEN 325 MG PO TABS: 325.0000 mg | ORAL_TABLET | Freq: Four times a day (QID) | ORAL | Status: DC | PRN

## 2020-01-04 MED ORDER — LOSARTAN POTASSIUM 50 MG PO TABS
50.0000 mg | ORAL_TABLET | ORAL | Status: DC
  Filled 2020-01-04: qty 1

## 2020-01-04 MED ORDER — POLYETHYLENE GLYCOL 3350 17 G PO PACK: 17.0000 g | PACK | Freq: Every day | ORAL | Status: DC | PRN

## 2020-01-04 MED ORDER — MEPIVACAINE HCL (PF) 2 % IJ SOLN
INTRAMUSCULAR | Status: DC | PRN
  Administered 2020-01-04: 3.5 mL via INTRATHECAL

## 2020-01-04 MED ORDER — METOCLOPRAMIDE HCL 5 MG PO TABS: 5.0000 mg | ORAL_TABLET | Freq: Three times a day (TID) | ORAL | Status: DC | PRN

## 2020-01-04 MED ORDER — FENTANYL CITRATE (PF) 100 MCG/2ML IJ SOLN: 25.0000 ug | INTRAMUSCULAR | Status: DC | PRN

## 2020-01-04 MED ORDER — ZOLPIDEM TARTRATE 5 MG PO TABS: 5.0000 mg | ORAL_TABLET | Freq: Every evening | ORAL | Status: DC | PRN

## 2020-01-04 MED ORDER — EPHEDRINE SULFATE 50 MG/ML IJ SOLN
INTRAMUSCULAR | Status: DC | PRN
  Administered 2020-01-04 (×2): 10 mg via INTRAVENOUS
  Administered 2020-01-04 (×2): 15 mg via INTRAVENOUS
  Administered 2020-01-04 (×2): 10 mg via INTRAVENOUS

## 2020-01-04 MED ORDER — BUPIVACAINE HCL (PF) 0.25 % IJ SOLN
INTRAMUSCULAR | Status: AC
  Filled 2020-01-04: qty 30

## 2020-01-04 MED ORDER — CEFAZOLIN SODIUM-DEXTROSE 2-4 GM/100ML-% IV SOLN
INTRAVENOUS | Status: AC
  Filled 2020-01-04: qty 100

## 2020-01-04 MED ORDER — HYDROCODONE-ACETAMINOPHEN 7.5-325 MG PO TABS: 1.0000 | ORAL_TABLET | ORAL | Status: DC | PRN

## 2020-01-04 MED ORDER — MORPHINE SULFATE (PF) 4 MG/ML IV SOLN: 0.5000 mg | INTRAVENOUS | Status: DC | PRN

## 2020-01-04 MED ORDER — METOCLOPRAMIDE HCL 5 MG/ML IJ SOLN: 5.0000 mg | Freq: Three times a day (TID) | INTRAMUSCULAR | Status: DC | PRN

## 2020-01-04 MED ORDER — TRAMADOL HCL 50 MG PO TABS: 50.0000 mg | ORAL_TABLET | Freq: Four times a day (QID) | ORAL | Status: DC | PRN

## 2020-01-04 MED ORDER — MAGNESIUM CITRATE PO SOLN: 1.0000 | Freq: Once | ORAL | Status: DC | PRN

## 2020-01-04 MED ORDER — BISACODYL 10 MG RE SUPP: 10.0000 mg | Freq: Every day | RECTAL | Status: DC | PRN

## 2020-01-04 MED ORDER — ATORVASTATIN CALCIUM 80 MG PO TABS: 80.0000 mg | ORAL_TABLET | Freq: Every day | ORAL | Status: DC

## 2020-01-04 MED ORDER — TRAMADOL HCL 50 MG PO TABS: 50.0000 mg | ORAL_TABLET | Freq: Four times a day (QID) | ORAL | 0 refills | Status: DC | PRN

## 2020-01-04 MED ORDER — ASPIRIN EC 325 MG PO TBEC: 325.0000 mg | DELAYED_RELEASE_TABLET | Freq: Every day | ORAL | 0 refills | Status: AC

## 2020-01-04 MED ORDER — EPHEDRINE 5 MG/ML INJ
INTRAVENOUS | Status: AC
  Filled 2020-01-04: qty 10

## 2020-01-04 MED ORDER — POVIDONE-IODINE 10 % EX SWAB
2.0000 "application " | Freq: Once | CUTANEOUS | Status: AC
  Administered 2020-01-04: 2 via TOPICAL

## 2020-01-04 MED ORDER — LACTATED RINGERS IV BOLUS
500.0000 mL | Freq: Once | INTRAVENOUS | Status: AC
  Administered 2020-01-04: 500 mL via INTRAVENOUS

## 2020-01-04 MED ORDER — MIDAZOLAM HCL 2 MG/2ML IJ SOLN
INTRAMUSCULAR | Status: AC
  Filled 2020-01-04: qty 2

## 2020-01-04 MED ORDER — HYDROCODONE-ACETAMINOPHEN 5-325 MG PO TABS: 1.0000 | ORAL_TABLET | ORAL | Status: DC | PRN

## 2020-01-04 MED ORDER — FENTANYL CITRATE (PF) 100 MCG/2ML IJ SOLN
INTRAMUSCULAR | Status: AC
  Filled 2020-01-04: qty 2

## 2020-01-04 MED ORDER — METHOCARBAMOL 500 MG PO TABS: 500.0000 mg | ORAL_TABLET | Freq: Four times a day (QID) | ORAL | 0 refills | Status: DC | PRN

## 2020-01-04 MED ORDER — CEFAZOLIN SODIUM-DEXTROSE 2-4 GM/100ML-% IV SOLN: 2.0000 g | Freq: Four times a day (QID) | INTRAVENOUS | Status: DC

## 2020-01-04 MED ORDER — BUPIVACAINE HCL 0.25 % IJ SOLN
INTRAMUSCULAR | Status: DC | PRN
  Administered 2020-01-04: 30 mL via INTRA_ARTICULAR

## 2020-01-04 MED ORDER — LACTATED RINGERS IV SOLN: INTRAVENOUS | Status: DC

## 2020-01-04 MED ORDER — METHOCARBAMOL 500 MG PO TABS: 500.0000 mg | ORAL_TABLET | Freq: Four times a day (QID) | ORAL | Status: DC | PRN

## 2020-01-04 MED ORDER — MENTHOL 3 MG MT LOZG: 1.0000 | LOZENGE | OROMUCOSAL | Status: DC | PRN

## 2020-01-04 MED ORDER — ONDANSETRON HCL 4 MG/2ML IJ SOLN
INTRAMUSCULAR | Status: DC | PRN
  Administered 2020-01-04: 4 mg via INTRAVENOUS

## 2020-01-04 MED ORDER — PHENOL 1.4 % MT LIQD: 1.0000 | OROMUCOSAL | Status: DC | PRN

## 2020-01-04 MED ORDER — HYDROCODONE-ACETAMINOPHEN 5-325 MG PO TABS: 1.0000 | ORAL_TABLET | Freq: Four times a day (QID) | ORAL | 0 refills | Status: DC | PRN

## 2020-01-04 MED ORDER — SODIUM CHLORIDE 0.9 % IR SOLN
Status: DC | PRN
  Administered 2020-01-04: 1000 mL

## 2020-01-04 MED ORDER — ONDANSETRON HCL 4 MG/2ML IJ SOLN: 4.0000 mg | Freq: Four times a day (QID) | INTRAMUSCULAR | Status: DC | PRN

## 2020-01-04 SURGICAL SUPPLY — 73 items
BAG DECANTER FOR FLEXI CONT (MISCELLANEOUS) ×3 IMPLANT
BAG ZIPLOCK 12X15 (MISCELLANEOUS) ×6 IMPLANT
BIT DRILL 2.8X128 (BIT) ×2 IMPLANT
BIT DRILL 2.8X128MM (BIT) ×1
BLADE EXTENDED COATED 6.5IN (ELECTRODE) ×3 IMPLANT
BLADE SAW SAG 73X25 THK (BLADE)
BLADE SAW SGTL 73X25 THK (BLADE) IMPLANT
CLOSURE WOUND 1/2 X4 (GAUZE/BANDAGES/DRESSINGS) ×2
COVER SURGICAL LIGHT HANDLE (MISCELLANEOUS) ×3 IMPLANT
COVER WAND RF STERILE (DRAPES) IMPLANT
DRAPE INCISE IOBAN 66X45 STRL (DRAPES) ×3 IMPLANT
DRAPE ORTHO SPLIT 77X108 STRL (DRAPES) ×4
DRAPE POUCH INSTRU U-SHP 10X18 (DRAPES) ×3 IMPLANT
DRAPE SURG ORHT 6 SPLT 77X108 (DRAPES) ×2 IMPLANT
DRAPE U-SHAPE 47X51 STRL (DRAPES) ×3 IMPLANT
DRSG AQUACEL AG ADV 3.5X10 (GAUZE/BANDAGES/DRESSINGS) ×2 IMPLANT
DRSG EMULSION OIL 3X16 NADH (GAUZE/BANDAGES/DRESSINGS) ×3 IMPLANT
DRSG MEPILEX BORDER 4X4 (GAUZE/BANDAGES/DRESSINGS) ×6 IMPLANT
DRSG MEPILEX BORDER 4X8 (GAUZE/BANDAGES/DRESSINGS) ×3 IMPLANT
DURAPREP 26ML APPLICATOR (WOUND CARE) ×3 IMPLANT
ELECT REM PT RETURN 15FT ADLT (MISCELLANEOUS) ×3 IMPLANT
EVACUATOR 1/8 PVC DRAIN (DRAIN) ×3 IMPLANT
FACESHIELD WRAPAROUND (MASK) ×12 IMPLANT
FACESHIELD WRAPAROUND OR TEAM (MASK) ×4 IMPLANT
GAUZE SPONGE 2X2 8PLY STRL LF (GAUZE/BANDAGES/DRESSINGS) IMPLANT
GAUZE SPONGE 4X4 12PLY STRL (GAUZE/BANDAGES/DRESSINGS) ×3 IMPLANT
GLOVE BIO SURGEON STRL SZ7 (GLOVE) ×3 IMPLANT
GLOVE BIO SURGEON STRL SZ8 (GLOVE) ×3 IMPLANT
GLOVE BIOGEL PI IND STRL 7.0 (GLOVE) ×1 IMPLANT
GLOVE BIOGEL PI IND STRL 8 (GLOVE) ×1 IMPLANT
GLOVE BIOGEL PI INDICATOR 7.0 (GLOVE) ×2
GLOVE BIOGEL PI INDICATOR 8 (GLOVE) ×2
GOWN STRL REUS W/TWL LRG LVL3 (GOWN DISPOSABLE) ×6 IMPLANT
HANDPIECE INTERPULSE COAX TIP (DISPOSABLE)
HEAD METAL ON METAL PLUS 6MM (Hips) ×2 IMPLANT
IMMOBILIZER KNEE 20 (SOFTGOODS)
IMMOBILIZER KNEE 20 THIGH 36 (SOFTGOODS) IMPLANT
KIT BASIN OR (CUSTOM PROCEDURE TRAY) ×3 IMPLANT
KIT TURNOVER KIT A (KITS) IMPLANT
LINER MARATHON 4 NEUTRAL 36X56 (Hips) ×2 IMPLANT
MANIFOLD NEPTUNE II (INSTRUMENTS) ×3 IMPLANT
MARKER SKIN DUAL TIP RULER LAB (MISCELLANEOUS) ×3 IMPLANT
NDL SAFETY ECLIPSE 18X1.5 (NEEDLE) ×1 IMPLANT
NEEDLE HYPO 18GX1.5 SHARP (NEEDLE) ×2
NS IRRIG 1000ML POUR BTL (IV SOLUTION) ×3 IMPLANT
PACK TOTAL JOINT (CUSTOM PROCEDURE TRAY) ×3 IMPLANT
PASSER SUT SWANSON 36MM LOOP (INSTRUMENTS) IMPLANT
PENCIL SMOKE EVACUATOR (MISCELLANEOUS) ×2 IMPLANT
PROTECTOR NERVE ULNAR (MISCELLANEOUS) ×3 IMPLANT
SET HNDPC FAN SPRY TIP SCT (DISPOSABLE) IMPLANT
SPONGE GAUZE 2X2 STER 10/PKG (GAUZE/BANDAGES/DRESSINGS) ×2
SPONGE LAP 18X18 RF (DISPOSABLE) ×3 IMPLANT
STAPLER VISISTAT 35W (STAPLE) IMPLANT
STRIP CLOSURE SKIN 1/2X4 (GAUZE/BANDAGES/DRESSINGS) ×2 IMPLANT
SUCTION FRAZIER HANDLE 12FR (TUBING) ×2
SUCTION TUBE FRAZIER 12FR DISP (TUBING) ×1 IMPLANT
SUT ETHIBOND NAB CT1 #1 30IN (SUTURE) IMPLANT
SUT MNCRL AB 4-0 PS2 18 (SUTURE) ×2 IMPLANT
SUT STRATAFIX 0 PDS 27 VIOLET (SUTURE) ×6
SUT VIC AB 1 CT1 27 (SUTURE) ×6
SUT VIC AB 1 CT1 27XBRD ANTBC (SUTURE) ×3 IMPLANT
SUT VIC AB 2-0 CT1 27 (SUTURE) ×6
SUT VIC AB 2-0 CT1 TAPERPNT 27 (SUTURE) ×3 IMPLANT
SUTURE STRATFX 0 PDS 27 VIOLET (SUTURE) ×1 IMPLANT
SWAB COLLECTION DEVICE MRSA (MISCELLANEOUS) IMPLANT
SWAB CULTURE ESWAB REG 1ML (MISCELLANEOUS) IMPLANT
SYR 30ML LL (SYRINGE) ×2 IMPLANT
SYR 50ML LL SCALE MARK (SYRINGE) ×3 IMPLANT
TOWEL OR 17X26 10 PK STRL BLUE (TOWEL DISPOSABLE) ×6 IMPLANT
TRAY CATH 16FR W/PLASTIC CATH (SET/KITS/TRAYS/PACK) ×2 IMPLANT
TRAY FOLEY MTR SLVR 16FR STAT (SET/KITS/TRAYS/PACK) ×1 IMPLANT
WATER STERILE IRR 1000ML POUR (IV SOLUTION) ×6 IMPLANT
YANKAUER SUCT BULB TIP 10FT TU (MISCELLANEOUS) ×3 IMPLANT

## 2020-01-04 NOTE — Transfer of Care (Signed)
Immediate Anesthesia Transfer of Care Note  Patient: [name]  Procedure(s) Performed: Right hip bearing surface (Right Hip)  Patient Location: PACU  Anesthesia Type:Spinal  Level of Consciousness: awake, alert  and oriented  Airway & Oxygen Therapy: Patient Spontanous Breathing and Patient connected to face mask oxygen  Post-op Assessment: Report given to RN and Post -op Vital signs reviewed and stable  Post vital signs: Reviewed and stable  Last Vitals:  Vitals Value Taken Time  BP    Temp    Pulse    Resp    SpO2      Last Pain:  Vitals:   01/04/20 1235  TempSrc:   PainSc: 0-No pain         Complications: No apparent anesthesia complications

## 2020-01-04 NOTE — Evaluation (Signed)
Physical Therapy Evaluation Patient Details Name: [name] MRN: [id number] DOB: 10/25/1951 Today's Date: 01/04/2020   History of Present Illness  Patient is 69 y.o. male s/p Rt THR posterior approach on 01/04/20 with PMH significant for bil TKA, Bil THA, HTN, HLD, hypothyroidism, GERD, MI, and OA.    Clinical Impression  [name] is a 69 y.o. male POD 0 s/p Rt THR posterior approach. Patient reports independence with mobility at baseline. Patient is now limited by functional impairments (see PT problem list below) and requires supervision for transfers and gait with RW. Patient was able to ambulate ~100 feet with RW and supervision and cues for safe walker management. Patient educated on safe sequencing for stair mobility and verbalized safe guarding position for people assisting with mobility. Patient instructed in exercises to facilitate ROM and circulation and educated on posterior hip precautions. Patient will benefit from continued skilled PT interventions to address impairments and progress towards PLOF. Patient has met mobility goals at adequate level for discharge home; will continue to follow if pt continues acute stay to progress towards Mod I goals.     Follow Up Recommendations Follow surgeon's recommendation for DC plan and follow-up therapies    Equipment Recommendations  None recommended by PT    Recommendations for Other Services       Precautions / Restrictions Precautions Precautions: Fall;Posterior Hip Precaution Booklet Issued: Yes (comment) Restrictions Weight Bearing Restrictions: No      Mobility  Bed Mobility Overal bed mobility: Needs Assistance Bed Mobility: Supine to Sit;Sit to Supine     Supine to sit: HOB elevated;Supervision Sit to supine: HOB elevated;Supervision   General bed mobility comments: no assist required, cues for use of gait belt to maintain posterior hip precautions, pt required extra time  Transfers Overall transfer level:  Needs assistance Equipment used: Rolling walker (2 wheeled) Transfers: Sit to/from Stand Sit to Stand: Supervision         General transfer comment: cues for safe hand placement and technique with RW, no assist for power up required.  Ambulation/Gait Ambulation/Gait assistance: Supervision Gait Distance (Feet): 110 Feet Assistive device: Rolling walker (2 wheeled) Gait Pattern/deviations: Decreased stride length;Step-through pattern;Decreased stance time - right;Decreased step length - left Gait velocity: decreased   General Gait Details: verbal cues for safe step pattern and proximity, pt maintained throughout. no overt LOB.  Stairs Stairs: Yes Stairs assistance: Supervision Stair Management: With cane;Forwards;One rail Right;Step to pattern Number of Stairs: 6(2x 3) General stair comments: verbal cues for safe step pattern "up wtih Lt, down with Rt" and for safe SPC sequencing/placement. pt with some difficulty sequencing descend on first bout, verbalized and demonstrated understanding on second.  Wheelchair Mobility    Modified Rankin (Stroke Patients Only)       Balance Overall balance assessment: Needs assistance Sitting-balance support: Feet supported Sitting balance-Leahy Scale: Good     Standing balance support: During functional activity;Bilateral upper extremity supported Standing balance-Leahy Scale: Fair              Pertinent Vitals/Pain Pain Assessment: 0-10 Pain Score: 2  Pain Location: Rt hip Pain Descriptors / Indicators: Discomfort;Sore Pain Intervention(s): Monitored during session    Home Living Family/patient expects to be discharged to:: Private residence Living Arrangements: Spouse/significant other Available Help at Discharge: Family;Available 24 hours/day(pt's wife is retired Therapist, sports) Type of Home: [facility] Access: Stairs to enter Entrance Stairs-Rails: Right Technical brewer of Steps: 2 [place]: One level Home Equipment:  Environmental consultant - 2 wheels;Cane -  single point;Crutches;Grab bars - tub/shower;Shower seat - built in      Prior Function Level of Independence: Independent         Comments: pt independent and enjoys swimming for exercise     Hand Dominance   Dominant Hand: Right    Extremity/Trunk Assessment   Upper Extremity Assessment Upper Extremity Assessment: Overall WFL for tasks assessed    Lower Extremity Assessment Lower Extremity Assessment: RLE deficits/detail RLE Deficits / Details: good quad activation in supine, pt able to complete LAQ RLE Sensation: WNL RLE Coordination: WNL    Cervical / Trunk Assessment Cervical / Trunk Assessment: Normal  Communication   Communication: No difficulties  Cognition Arousal/Alertness: Awake/alert Behavior During Therapy: WFL for tasks assessed/performed Overall Cognitive Status: Within Functional Limits for tasks assessed             General Comments      Exercises Total Joint Exercises Ankle Circles/Pumps: AROM;10 reps;Supine;Both Quad Sets: AROM;5 reps;Supine;Right Short Arc Quad: AROM;5 reps;Supine;Right Heel Slides: AAROM;5 reps;Supine;Right Hip ABduction/ADduction: 5 reps;Supine;Right;AAROM Long Arc Quad: AROM;5 reps;Seated;Right Other Exercises Other Exercises: educated on standing exercises: marching, hip abduction, hip extension, knee flexion (instructed to perform at counter with bil UE support)   Assessment/Plan    PT Assessment Patient needs continued PT services  PT Problem List Decreased strength;Decreased balance;Decreased mobility;Decreased activity tolerance;Decreased range of motion;Decreased knowledge of use of DME       PT Treatment Interventions DME instruction;Functional mobility training;Balance training;Patient/family education;Therapeutic activities;Gait training;Stair training;Therapeutic exercise    PT Goals (Current goals can be found in the Care Plan section)  Acute Rehab PT Goals Patient Stated  Goal: to go home and get back to swimming PT Goal Formulation: With patient Time For Goal Achievement: 01/11/20 Potential to Achieve Goals: Good    Frequency 7X/week    AM-PAC PT "6 Clicks" Mobility  Outcome Measure Help needed turning from your back to your side while in a flat bed without using bedrails?: A Little Help needed moving from lying on your back to sitting on the side of a flat bed without using bedrails?: A Little Help needed moving to and from a bed to a chair (including a wheelchair)?: A Little Help needed standing up from a chair using your arms (e.g., wheelchair or bedside chair)?: A Little Help needed to walk in hospital room?: A Little Help needed climbing 3-5 steps with a railing? : A Little 6 Click Score: 18    End of Session Equipment Utilized During Treatment: Gait belt Activity Tolerance: Patient tolerated treatment well Patient left: in bed;with call bell/phone within reach Nurse Communication: Mobility status PT Visit Diagnosis: Muscle weakness (generalized) (M62.81);Difficulty in walking, not elsewhere classified (R26.2)    Time: 8088-1103 PT Time Calculation (min) (ACUTE ONLY): 44 min   Charges:   PT Evaluation $PT Eval Low Complexity: 1 Low PT Treatments $Gait Training: 8-22 mins $Therapeutic Exercise: 8-22 mins      Verner Mould, DPT Physical Therapist with [facility] [phone]  01/04/2020 7:40 PM

## 2020-01-04 NOTE — Op Note (Signed)
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  PROCEDURE:  Right hip bearing surface revision.  SURGEON:  Gaynelle Arabian, MD  ASSISTANT:  Theresa Duty, PA-C  ANESTHESIA:  Spinal.  ESTIMATED BLOOD LOSS:  200 mL.  DRAINS:  Hemovac x1.  COMPLICATIONS:  None.  CONDITION:  Stable to recovery.  BRIEF CLINICAL NOTE:  The patient is a 69 year old male who had a metal-on-metal right total hip arthroplasty done several years ago.  He has had intermittent lateral hip discomfort and had cobalt and chromium levels drawn on several occasions, which  have shown increasing abnormal levels.  He has had a MARS-MRI with small fluid collection.  He presents now for bearing surface versus total hip arthroplasty revision.  PROCEDURE IN DETAIL:  After successful administration of spinal anesthetic, the patient was placed in the left lateral decubitus position with the right side up and held with a hip positioner.  Right lower extremity was isolated from his perineum with  plastic drapes and prepped and draped in the usual sterile fashion.  Short posterolateral incision was made with a 10 blade through subcutaneous tissue to the fascia lata, which was incised in line with the skin incision.  Sciatic nerve was palpated and  protected.  There was a fair amount of fluid present in the joint.  It was clear fluid, but there is a fair amount of fibrinous tissue debris consistent with a pseudotumor right around the joint.  Fortunately, there is no muscle damage to the abductor  and no evidence of any damage to the greater trochanter or proximal femur.  I removed the abnormal  tissue back to normal appearing tissue.  Throughout this whole time, the sciatic nerve had been identified, palpated and protected.  The joint was exposed  and the hip was dislocated.  The femoral head was removed.  This was a 40 mm +3 femoral head for the S-ROM femoral stem.  There was some mild burnishing of the trunnion and I used the Bovie pad to remove the debris from the trunnion.  The trunnion looked  completely normal after I did this.  The femur was then retracted anteriorly to gain acetabular exposure.  I was able to put the extraction device into a locking tab of the acetabular shell and this allowed for removal of the metal acetabular liner.  The shell was in excellent position and is well fixed.  I copiously irrigated the wound with about 500 mL of  saline.  This was a 56 mm cup.  We placed the 36 x 56 polyethylene liner with a +4 neutral buildup.  The liner was impacted into the shell and it was found to be locked in place.  We had trialed a 36+6 head onto the femoral stem and reduced the hip with  outstanding stability.  By placing the right leg on top of the left, it felt as though the leg lengths were equal.  Hip was dislocated and the trial head was removed.  The permanent 36 mm +6 head was then impacted onto the femoral neck and the hip was  reduced with outstanding stability throughout.  The short rotators were reapproximated to the femur through drill holes with Ethibond suture.  The repair was performed of  the posterior capsule.  The fascia lata was closed over a Hemovac drain with a  running 0 Stratafix suture.  Second Stratafix suture was placed in the deep subcutaneous tissue.  Superficial subcutaneous was closed with interrupted 2-0 Vicryl and subcuticular running 4-0 Monocryl.  The incision was cleaned and dried and Steri-Strips  and a bulky sterile dressing were applied.  He was then awakened and transported to recovery in stable condition.  Note that a surgical assistant was a  medical necessity for this procedure to do it in a safe and expeditious.  A surgical assistant was necessary for proper positioning the limb, for safe removal of the old prosthesis and safe and accurate placement of  the new prosthesis  VN/NUANCE  D:01/04/2020 T:01/04/2020 JOB:[id number]

## 2020-01-04 NOTE — Brief Op Note (Signed)
01/04/2020  2:57 PM  PATIENT:  [name]  69 y.o. 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

## 2020-01-04 NOTE — Anesthesia Procedure Notes (Signed)
Spinal  Patient location during procedure: OR Start time: 01/04/2020 1:22 PM End time: 01/04/2020 1:24 PM Staffing Performed: anesthesiologist  Anesthesiologist: Catalina Gravel, MD Preanesthetic Checklist Completed: patient identified, IV checked, risks and benefits discussed, surgical consent, monitors and equipment checked, pre-op evaluation and timeout performed Spinal Block Patient position: sitting Prep: DuraPrep and site prepped and draped Patient monitoring: continuous pulse ox and blood pressure Approach: midline Location: L3-4 Injection technique: single-shot Needle Needle type: Pencan  Needle gauge: 24 G Additional Notes Functioning IV was confirmed and monitors were applied. Sterile prep and drape, including hand hygiene, mask and sterile gloves were used. The patient was positioned and the spine was prepped. The skin was anesthetized with lidocaine.  Free flow of clear CSF was obtained prior to injecting local anesthetic into the CSF.  The spinal needle aspirated freely following injection.  The needle was carefully withdrawn.  The patient tolerated the procedure well. Consent was obtained prior to procedure with all questions answered and concerns addressed. Risks including but not limited to bleeding, infection, nerve damage, paralysis, failed block, inadequate analgesia, allergic reaction, high spinal, itching and headache were discussed and the patient wished to proceed.   Hoy Morn, MD

## 2020-01-04 NOTE — Care Management CC44 (Signed)
Condition Code 44 Documentation Completed  Patient Details  Name: [name] MRN: [id number] Date of Birth: [birth year]   Condition Code 44 given:  Yes Patient signature on Condition Code 44 notice:  Yes Documentation of 2 MD's agreement:  Yes Code 44 added to claim:  Yes    Leeroy Cha, RN 01/04/2020, 4:45 PM

## 2020-01-04 NOTE — Interval H&P Note (Signed)
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  The various methods of treatment have been discussed with the patient and family. After consideration of risks, benefits and other options for treatment, the patient has consented to  Procedure(s) with comments: Right hip bearing surface vs total hip arthroplasty revision (Right) - 172min as a surgical intervention.  The patient's history has been reviewed, patient examined, no change in status, stable for surgery.  I have reviewed the patient's chart and labs.  Questions were answered to the patient's satisfaction.     Pilar Plate Constantinos Krempasky

## 2020-01-04 NOTE — Progress Notes (Signed)
Pt wife called, reports pt at home, dressing is saturated and dripping down leg.  Reports pt feels fine.  Advised spouse to call office ASAP and to go to ER if pt faint or does not feel well.

## 2020-01-04 NOTE — Care Management Obs Status (Signed)
[place] NOTIFICATION   Patient Details  Name: [name] MRN: [id number] Date of Birth: 1951-12-19   Medicare Observation Status Notification Given:  Yes    Leeroy Cha, RN 01/04/2020, 4:45 PM

## 2020-01-04 NOTE — Discharge Instructions (Addendum)
Dr. Gaynelle Arabian Total Joint Specialist [facility] [address], [place], [place] [address] [phone]       TOTAL HIP REVISION - POSTERIOR APPROACH  POSTOPERATIVE DIRECTIONS FOR SAME DAY DISCHARGE    Hip Rehabilitation, Guidelines Following Surgery  The results of a hip operation are greatly improved after range of motion and muscle strengthening exercises. Follow all safety measures which are given to protect your hip. If any of these exercises cause increased pain or swelling in your joint, decrease the amount until you are comfortable again. Then slowly increase the exercises. Call your caregiver if you have problems or questions.   BLOOD CLOT PREVENTION  . Take a 325 mg Aspirin once a day with your 75 mg Plavix for three weeks. . Then resume one 81 mg Aspirin once a day with your 75 mg Plavix. . May resume vitamins/supplements once you are discharged from the hospital   PRECAUTIONS ([place]) . Do not bend your hip past a 90 degree angle . Do not cross your legs. . Don't twist your hip inwards- keep knees and toes pointed upwards    HOME CARE INSTRUCTIONS  . Remove items at home which could result in a fall. This includes throw rugs or furniture in walking pathways.   ICE to the affected hip as frequently as 20-30 minutes an hour and then as needed for pain and swelling.  Continue to use ice on the hip for pain and swelling from surgery. You may notice swelling that will progress down to the foot and ankle.  This is normal after surgery.  Elevate the leg when you are not up walking on it.    Continue to use the breathing machine which will help keep your temperature down.  It is common for your temperature to cycle up and down following surgery, especially at night when you are not up moving around and exerting yourself.  The breathing machine keeps your lungs expanded and your temperature down.  DIET You may resume your previous home  diet once you are discharged from the hospital.  DRESSING / WOUND CARE / SHOWERING You have an adhesive waterproof bandage on. You may start showering 3 days after surgery with this bandage on. You may remove it after 7-10 days and do not need to dress the wound after that unless there is drainage.  ACTIVITY . For the first 3-5 days it is important to rest and keep the operative leg elevated. You should, as a general rule, rest for 50 minutes per hour and get up and walk/stretch for 10 minutes per hour. After 5 days you can slowly increase activity as tolerated. Marland Kitchen Perform the exercises you were provided twice a day for about 15-20 minutes each session. Begin these 2 days after surgery.  WEIGHT BEARING . You may bear weight as tolerated on your operative leg using a walker for assistance . Walk with your walker as instructed. Use the walker until you are comfortable transitioning to a cane. Walk with the cane in the opposite hand of the operative leg. You may discontinue the cane once you are comfortable and walking steadily.  POSTOPERATIVE CONSTIPATION PROTOCOL Constipation - defined medically as fewer than three stools per week and severe constipation as less than one stool per week.  One of the most common issues patients have following surgery is constipation.  Even if you have a regular bowel pattern at home, your normal regimen is likely to be disrupted due  to multiple reasons following surgery.  Combination of anesthesia, postoperative narcotics, change in appetite and fluid intake all can affect your bowels.  In order to avoid complications following surgery, here are some recommendations in order to help you during your recovery period.  Colace (docusate) - Pick up an over-the-counter form of Colace or another stool softener and take twice a day as long as you are requiring postoperative pain medications.  Take with a full glass of water daily.  If you experience loose stools or diarrhea,  hold the colace until you stool forms back up.  If your symptoms do not get better within 1 week or if they get worse, check with your doctor  MiraLax (polyethylene glycol) - Pick up over-the-counter to have on hand.  MiraLax is a solution that will increase the amount of water in your bowels to assist with bowel movements.  Take as directed and can mix with a glass of water, juice, soda, coffee, or tea.  Take if you go more than two days without a movement. Do not use MiraLax more than once per day. Call your doctor if you are still constipated or irregular after using this medication for 5 days in a row.  If you continue to have problems with postoperative constipation, please contact the office for further assistance and recommendations.  If you experience "the worst abdominal pain ever" or develop nausea or vomiting, please contact the office immediatly for further recommendations for treatment.  ITCHING  If you experience itching with your medications, try taking only a single pain pill, or even half a pain pill at a time.  You can also use Benadryl over the counter for itching or also to help with sleep.   TED HOSE STOCKINGS Wear the elastic stockings on both legs for three weeks following surgery during the day but you may remove then at night for sleeping.  MEDICATIONS See your medication summary on the "After Visit Summary" that the nursing staff will review with you prior to discharge.  You may have some home medications which will be placed on hold until you complete the course of blood thinner medication.  It is important for you to complete the blood thinner medication as prescribed by your surgeon.  Continue your approved medications as instructed at time of discharge.  PRECAUTIONS If you experience chest pain or shortness of breath - call 911 immediately for transfer to the hospital emergency department.  If you develop a fever greater that 101 F, purulent drainage from wound,  increased redness or drainage from wound, foul odor from the wound/dressing, or calf pain - CONTACT YOUR SURGEON.                                                   FOLLOW-UP APPOINTMENTS Make sure you keep all of your appointments after your operation with your surgeon and caregivers. You should call the office at the above phone number and make an appointment for approximately two weeks after the date of your surgery or on the date instructed by your surgeon outlined in the "After Visit Summary".  MAKE SURE YOU:  . Understand these instructions.  . Get help right away if you are not doing well or get worse.    Pick up stool softner and laxative for home use following surgery while on pain medications. May  shower starting three days after surgery. Continue to use ice for pain and swelling after surgery. Do not use any lotions or creams on the incision until instructed by your surgeon.

## 2020-01-04 NOTE — Anesthesia Postprocedure Evaluation (Signed)
Anesthesia Post Note  Patient: [name]  Procedure(s) Performed: Right hip bearing surface (Right Hip)     Patient location during evaluation: PACU Anesthesia Type: Spinal Level of consciousness: oriented, awake and alert and awake Pain management: pain level controlled Vital Signs Assessment: post-procedure vital signs reviewed and stable Respiratory status: spontaneous breathing, respiratory function stable and nonlabored ventilation Cardiovascular status: blood pressure returned to baseline and stable Postop Assessment: no headache, no backache, no apparent nausea or vomiting, patient able to bend at knees and spinal receding Anesthetic complications: no    Last Vitals:  Vitals:   01/04/20 1630 01/04/20 1655  BP: (!) 151/99 (!) 149/85  Pulse: (!) 56 (!) 56  Resp: 11 14  Temp: 36.7 C 36.5 C  SpO2:  98%    Last Pain:  Vitals:   01/04/20 1655  TempSrc:   PainSc: 2                  Catalina Gravel

## 2020-01-05 ENCOUNTER — Encounter: Payer: Self-pay | Admitting: *Deleted

## 2020-01-05 DIAGNOSIS — R338 Other retention of urine: Secondary | ICD-10-CM | POA: Diagnosis not present

## 2020-01-05 DIAGNOSIS — R972 Elevated prostate specific antigen [PSA]: Secondary | ICD-10-CM | POA: Diagnosis not present

## 2020-01-05 DIAGNOSIS — N401 Enlarged prostate with lower urinary tract symptoms: Secondary | ICD-10-CM | POA: Diagnosis not present

## 2020-01-16 DIAGNOSIS — R338 Other retention of urine: Secondary | ICD-10-CM | POA: Diagnosis not present

## 2020-01-16 DIAGNOSIS — N401 Enlarged prostate with lower urinary tract symptoms: Secondary | ICD-10-CM | POA: Diagnosis not present

## 2020-01-16 DIAGNOSIS — R310 Gross hematuria: Secondary | ICD-10-CM | POA: Diagnosis not present

## 2020-01-17 ENCOUNTER — Other Ambulatory Visit: Payer: Self-pay | Admitting: Cardiology

## 2020-01-18 ENCOUNTER — Ambulatory Visit: Payer: Medicare Other | Attending: Internal Medicine

## 2020-01-18 DIAGNOSIS — Z23 Encounter for immunization: Secondary | ICD-10-CM | POA: Diagnosis not present

## 2020-01-18 NOTE — Progress Notes (Signed)
   Covid-19 Vaccination Clinic  Name:  [name]    MRN: [id number] DOB: [birth year]  01/18/2020  Mr. Letter was observed post Covid-19 immunization for 15 minutes without incidence. He was provided with Vaccine Information Sheet and instruction to access the V-Safe system.   Mr. Nephew was instructed to call 911 with any severe reactions post vaccine: Marland Kitchen Difficulty breathing  . Swelling of your face and throat  . A fast heartbeat  . A bad rash all over your body  . Dizziness and weakness    Immunizations Administered    Name Date Dose VIS Date Route   Pfizer COVID-19 Vaccine 01/18/2020  3:12 PM 0.3 mL 12/09/2019 Intramuscular   Manufacturer: [place]   Lot: [id number]   [place]: [id number]

## 2020-01-18 NOTE — Telephone Encounter (Signed)
Pt is requesting a refill on Zolpidem. Please address

## 2020-01-23 ENCOUNTER — Other Ambulatory Visit (HOSPITAL_COMMUNITY): Payer: Medicare Other

## 2020-01-23 ENCOUNTER — Other Ambulatory Visit: Payer: Self-pay | Admitting: Cardiology

## 2020-01-25 NOTE — Telephone Encounter (Signed)
Pt's pharmacy is requesting a refill on zolpidem. Would Dr. Turner like to refill this medication? Please address 

## 2020-01-25 NOTE — Telephone Encounter (Signed)
Please let me know when this was last refilled and for how many refills

## 2020-01-27 ENCOUNTER — Other Ambulatory Visit: Payer: Self-pay

## 2020-01-27 NOTE — Telephone Encounter (Signed)
Last refilled on 12/18/19 for 20 tablets. No refills remaining.

## 2020-01-27 NOTE — Telephone Encounter (Signed)
I have told him I do not want him taking the Ambien nightly because it is addictive.  Ok to refill for #15 tabs with 0 refills but then will need to get this refilled going forward from his PCP

## 2020-02-05 ENCOUNTER — Other Ambulatory Visit: Payer: Self-pay | Admitting: Cardiology

## 2020-02-08 ENCOUNTER — Ambulatory Visit: Payer: Medicare Other | Attending: Internal Medicine

## 2020-02-08 DIAGNOSIS — Z23 Encounter for immunization: Secondary | ICD-10-CM

## 2020-02-08 NOTE — Progress Notes (Signed)
   Covid-19 Vaccination Clinic  Name:  [name]    MRN: [id number] DOB: [birth year]  02/08/2020  [name] was observed post Covid-19 immunization for 15 minutes without incidence. He was provided with Vaccine Information Sheet and instruction to access the V-Safe system.   [name] was instructed to call 911 with any severe reactions post vaccine: Marland Kitchen Difficulty breathing  . Swelling of your face and throat  . A fast heartbeat  . A bad rash all over your body  . Dizziness and weakness    Immunizations Administered    Name Date Dose VIS Date Route   Pfizer COVID-19 Vaccine 02/08/2020  9:49 AM 0.3 mL 12/09/2019 Intramuscular   Manufacturer: [place]   Lot: [id number]   [place]: [id number]

## 2020-02-09 DIAGNOSIS — Z96641 Presence of right artificial hip joint: Secondary | ICD-10-CM | POA: Diagnosis not present

## 2020-02-09 DIAGNOSIS — Z471 Aftercare following joint replacement surgery: Secondary | ICD-10-CM | POA: Diagnosis not present

## 2020-02-21 DIAGNOSIS — M25561 Pain in right knee: Secondary | ICD-10-CM | POA: Insufficient documentation

## 2020-02-21 DIAGNOSIS — M25551 Pain in right hip: Secondary | ICD-10-CM | POA: Diagnosis not present

## 2020-02-21 DIAGNOSIS — M25562 Pain in left knee: Secondary | ICD-10-CM | POA: Insufficient documentation

## 2020-02-21 DIAGNOSIS — M25552 Pain in left hip: Secondary | ICD-10-CM | POA: Diagnosis not present

## 2020-02-24 DIAGNOSIS — M25552 Pain in left hip: Secondary | ICD-10-CM | POA: Diagnosis not present

## 2020-02-24 DIAGNOSIS — M25551 Pain in right hip: Secondary | ICD-10-CM | POA: Diagnosis not present

## 2020-02-24 DIAGNOSIS — M25562 Pain in left knee: Secondary | ICD-10-CM | POA: Diagnosis not present

## 2020-02-24 DIAGNOSIS — M25561 Pain in right knee: Secondary | ICD-10-CM | POA: Diagnosis not present

## 2020-02-28 ENCOUNTER — Other Ambulatory Visit: Payer: Self-pay

## 2020-02-28 ENCOUNTER — Ambulatory Visit (HOSPITAL_COMMUNITY)
Admission: RE | Admit: 2020-02-28 | Discharge: 2020-02-28 | Disposition: A | Discharge status: Home / Self Care | Payer: Medicare Other | Source: Ambulatory Visit | Attending: Urology | Admitting: Urology

## 2020-02-28 DIAGNOSIS — N400 Enlarged prostate without lower urinary tract symptoms: Secondary | ICD-10-CM | POA: Diagnosis not present

## 2020-02-28 DIAGNOSIS — R972 Elevated prostate specific antigen [PSA]: Secondary | ICD-10-CM | POA: Insufficient documentation

## 2020-02-28 LAB — POCT I-STAT CREATININE: Creatinine, Ser: 0.7 mg/dL (ref 0.61–1.24)

## 2020-02-28 MED ORDER — GADOBUTROL 1 MMOL/ML IV SOLN
10.0000 mL | Freq: Once | INTRAVENOUS | Status: AC | PRN
  Administered 2020-02-28: 10 mL via INTRAVENOUS

## 2020-03-05 DIAGNOSIS — R972 Elevated prostate specific antigen [PSA]: Secondary | ICD-10-CM | POA: Diagnosis not present

## 2020-03-06 DIAGNOSIS — M25651 Stiffness of right hip, not elsewhere classified: Secondary | ICD-10-CM | POA: Diagnosis not present

## 2020-03-09 DIAGNOSIS — M25561 Pain in right knee: Secondary | ICD-10-CM | POA: Diagnosis not present

## 2020-03-09 DIAGNOSIS — M25562 Pain in left knee: Secondary | ICD-10-CM | POA: Diagnosis not present

## 2020-03-09 DIAGNOSIS — M25551 Pain in right hip: Secondary | ICD-10-CM | POA: Diagnosis not present

## 2020-03-09 DIAGNOSIS — M25552 Pain in left hip: Secondary | ICD-10-CM | POA: Diagnosis not present

## 2020-03-14 DIAGNOSIS — M25651 Stiffness of right hip, not elsewhere classified: Secondary | ICD-10-CM | POA: Diagnosis not present

## 2020-03-15 DIAGNOSIS — R3914 Feeling of incomplete bladder emptying: Secondary | ICD-10-CM | POA: Diagnosis not present

## 2020-03-15 DIAGNOSIS — R972 Elevated prostate specific antigen [PSA]: Secondary | ICD-10-CM | POA: Diagnosis not present

## 2020-03-16 DIAGNOSIS — M25651 Stiffness of right hip, not elsewhere classified: Secondary | ICD-10-CM | POA: Diagnosis not present

## 2020-03-19 DIAGNOSIS — M25651 Stiffness of right hip, not elsewhere classified: Secondary | ICD-10-CM | POA: Diagnosis not present

## 2020-03-23 DIAGNOSIS — M25551 Pain in right hip: Secondary | ICD-10-CM | POA: Diagnosis not present

## 2020-03-23 DIAGNOSIS — M25552 Pain in left hip: Secondary | ICD-10-CM | POA: Diagnosis not present

## 2020-03-23 DIAGNOSIS — M25562 Pain in left knee: Secondary | ICD-10-CM | POA: Diagnosis not present

## 2020-03-23 DIAGNOSIS — M25561 Pain in right knee: Secondary | ICD-10-CM | POA: Diagnosis not present

## 2020-03-30 DIAGNOSIS — M25651 Stiffness of right hip, not elsewhere classified: Secondary | ICD-10-CM | POA: Diagnosis not present

## 2020-04-03 DIAGNOSIS — M25651 Stiffness of right hip, not elsewhere classified: Secondary | ICD-10-CM | POA: Diagnosis not present

## 2020-04-06 ENCOUNTER — Telehealth: Payer: Self-pay | Admitting: *Deleted

## 2020-04-06 NOTE — Telephone Encounter (Signed)
-----   Message from Sueanne Margarita, MD sent at 04/05/2020  2:47 PM EDT ----- AHI mildly elevated - please verify type of mask patient is using and if he is sleeping supine.  Compliance good

## 2020-04-06 NOTE — Telephone Encounter (Addendum)
Pt has a medium F30i mask  Patient is sleeping supine and has been for 3 months because he had hip surgery and that is the only way to sleep comfortable for now.

## 2020-04-10 NOTE — Telephone Encounter (Signed)
  [name] Margarita, MD  Freada Bergeron, CMA  OK AHI is not too bad   Traci

## 2020-04-16 ENCOUNTER — Encounter: Payer: Self-pay | Admitting: Cardiology

## 2020-04-16 ENCOUNTER — Other Ambulatory Visit: Payer: Medicare Other | Admitting: *Deleted

## 2020-04-16 ENCOUNTER — Ambulatory Visit (INDEPENDENT_AMBULATORY_CARE_PROVIDER_SITE_OTHER): Payer: Medicare Other | Admitting: Cardiology

## 2020-04-16 ENCOUNTER — Other Ambulatory Visit: Payer: Self-pay

## 2020-04-16 VITALS — BP 138/86 | HR 57 | Ht 72.0 in | Wt 265.0 lb

## 2020-04-16 DIAGNOSIS — Z9861 Coronary angioplasty status: Secondary | ICD-10-CM

## 2020-04-16 DIAGNOSIS — I2510 Atherosclerotic heart disease of native coronary artery without angina pectoris: Secondary | ICD-10-CM

## 2020-04-16 DIAGNOSIS — G4733 Obstructive sleep apnea (adult) (pediatric): Secondary | ICD-10-CM | POA: Diagnosis not present

## 2020-04-16 DIAGNOSIS — I10 Essential (primary) hypertension: Secondary | ICD-10-CM | POA: Diagnosis not present

## 2020-04-16 DIAGNOSIS — E7800 Pure hypercholesterolemia, unspecified: Secondary | ICD-10-CM | POA: Diagnosis not present

## 2020-04-16 LAB — HEPATIC FUNCTION PANEL
ALT: 66 IU/L — ABNORMAL HIGH (ref 0–44)
AST: 35 IU/L (ref 0–40)
Albumin: 4.2 g/dL (ref 3.8–4.8)
Alkaline Phosphatase: 92 IU/L (ref 39–117)
Bilirubin Total: 0.3 mg/dL (ref 0.0–1.2)
Bilirubin, Direct: 0.14 mg/dL (ref 0.00–0.40)
Total Protein: 7.1 g/dL (ref 6.0–8.5)

## 2020-04-16 LAB — BASIC METABOLIC PANEL
BUN/Creatinine Ratio: 20 (ref 10–24)
BUN: 14 mg/dL (ref 8–27)
CO2: 21 mmol/L (ref 20–29)
Calcium: 9.2 mg/dL (ref 8.6–10.2)
Chloride: 106 mmol/L (ref 96–106)
Creatinine, Ser: 0.71 mg/dL — ABNORMAL LOW (ref 0.76–1.27)
GFR calc Af Amer: 113 mL/min/{1.73_m2} (ref >59)
GFR calc non Af Amer: 98 mL/min/{1.73_m2} (ref >59)
Glucose: 107 mg/dL — ABNORMAL HIGH (ref 65–99)
Potassium: 4.2 mmol/L (ref 3.5–5.2)
Sodium: 143 mmol/L (ref 134–144)

## 2020-04-16 LAB — LIPID PANEL
Chol/HDL Ratio: 2.4 ratio (ref 0.0–5.0)
Cholesterol, Total: 99 mg/dL — ABNORMAL LOW (ref 100–199)
HDL: 41 mg/dL (ref >39)
LDL Chol Calc (NIH): 47 mg/dL (ref 0–99)
Triglycerides: 41 mg/dL (ref 0–149)
VLDL Cholesterol Cal: 11 mg/dL (ref 5–40)

## 2020-04-16 NOTE — Progress Notes (Signed)
Cardiology Office Note:    Date:  04/16/2020   ID:  [name], DOB 01/14/1951, MRN [id number]  PCP:  Binnie Rail, MD  Cardiologist:  Fransico Him, MD    Referring MD: Binnie Rail, MD   Chief Complaint  Patient presents with  . Coronary Artery Disease  . Hypertension  . Sleep Apnea  . Hyperlipidemia    History of Present Illness:    [name] is a 69 y.o. male with a hx of ASCAD s/p inferior STEMI with cath showing 95% PDA that was felt to be the culprit vessel. This was treated with PCI-DES. Also noted was CTO CFX with collaterals from the PDA on OM1. There was an unsuccessful attempt at opening the CFX.Also has 70% mid LAD lesion, to be treated medically.EF was 60% with inferobasilar HK. Echo showed EF 65% with no WMA, grade 1 DD.He also has a hx of OSA on CPAP, obesity, HTN and PAF on anticoagulation.    He is here today for followup and is doing well.  He denies any chest pain or pressure, SOB, DOE, PND, orthopnea, LE edema, dizziness, palpitations or syncope. He is compliant with his meds and is tolerating meds with no SE.  He is doing well with his CPAP device and thinks that He has gotten used to it.  He tolerates the mask and feels the pressure is adequate.  Since going on CPAP he feels rested in the am and has no significant daytime sleepiness.  He denies any significant mouth or nasal dryness or nasal congestion.  He does not think that he snores.     Past Medical History:  Diagnosis Date  . BPH (benign prostatic hypertrophy)   . COLONIC POLYPS, HX OF 2007   clear colo w/o polyps 04/2015: 70yr follow up  . Coronary artery disease   . GERD (gastroesophageal reflux disease)   . Heartburn   . History of kidney stones    passed  . Hyperlipidemia   . HYPERTENSION   . HYPOTHYROIDISM   . Myocardial infarction ([place]) 12/2018   Inferior STEMI  . OA (osteoarthritis)    Knees  . OBESITY   . OSA treated with BiPAP   . PAF (paroxysmal atrial fibrillation)  ([place])   . Pre-diabetes   . Umbilical hernia   . Ventral hernia     Past Surgical History:  Procedure Laterality Date  . arthroscopic knee surgery     (R) 2003 & (L) 2010  . CARDIAC CATHETERIZATION    . COLONOSCOPY  04/2015   no polyps ([place])  . CORONARY/GRAFT ACUTE MI REVASCULARIZATION N/A 12/30/2018   Procedure: Coronary/Graft Acute MI Revascularization;  Surgeon: Belva Crome, MD;  Location: [facility];  Service: Cardiovascular;  Laterality: N/A;  . LAMINECTOMY  1991  . LEFT HEART CATH AND CORONARY ANGIOGRAPHY N/A 12/30/2018   Procedure: LEFT HEART CATH AND CORONARY ANGIOGRAPHY;  Surgeon: Belva Crome, MD;  Location: [facility];  Service: Cardiovascular;  Laterality: N/A;  . TONSILLECTOMY  1990   w/ adenoids and uvula  . TOTAL HIP ARTHROPLASTY Right 01/2011   [place]  . TOTAL HIP ARTHROPLASTY Left 04/16/2016   Procedure: TOTAL HIP ARTHROPLASTY ANTERIOR APPROACH;  Surgeon: Gaynelle Arabian, MD;  Location: [facility];  Service: Orthopedics;  Laterality: Left;  . TOTAL HIP REVISION Right 01/04/2020   Procedure: Right hip bearing surface;  Surgeon: Gaynelle Arabian, MD;  Location: [facility];  Service: Orthopedics;  Laterality: Right;  13min  .  TOTAL KNEE ARTHROPLASTY  12/27/2012   Procedure: TOTAL KNEE BILATERAL;  Surgeon: Gearlean Alf, MD;  Location: [facility];  Service: Orthopedics;  Laterality: Bilateral;    Current Medications: Current Meds  Medication Sig  . acetaminophen (TYLENOL) 325 MG tablet Take 2 tablets (650 mg total) by mouth every 4 (four) hours as needed for headache or mild pain.  Marland Kitchen aspirin EC 81 MG tablet Take 81 mg by mouth daily.  Marland Kitchen atorvastatin (LIPITOR) 80 MG tablet TAKE 1 TABLET (80 MG TOTAL) BY MOUTH DAILY AT 6 PM.  . clopidogrel (PLAVIX) 75 MG tablet Take 1 tablet (75 mg total) by mouth daily.  Marland Kitchen HYDROcodone-acetaminophen (NORCO/VICODIN) 5-325 MG tablet Take 1-2 tablets by mouth every 6 (six) hours as needed for severe pain.  Marland Kitchen levothyroxine (SYNTHROID)  150 MCG tablet TAKE 1 TABLET BY MOUTH EVERY DAY (Patient taking differently: Take 150 mcg by mouth daily before breakfast. )  . losartan (COZAAR) 50 MG tablet TAKE 1 TABLET BY MOUTH EVERY DAY (Patient taking differently: Take by mouth daily. )  . methocarbamol (ROBAXIN) 500 MG tablet Take 1 tablet (500 mg total) by mouth every 6 (six) hours as needed for muscle spasms.  . metoprolol succinate (TOPROL-XL) 25 MG 24 hr tablet TAKE 1 TABLET BY MOUTH EVERY DAY (Patient taking differently: Take 25 mg by mouth daily. )  . Multiple Vitamins-Minerals (MULTIVITAMIN) tablet Take 1 tablet by mouth daily.  . nitroGLYCERIN (NITROSTAT) 0.4 MG SL tablet Place 1 tablet (0.4 mg total) under the tongue every 5 (five) minutes as needed for chest pain.  . tamsulosin (FLOMAX) 0.4 MG CAPS capsule Take 0.4 mg by mouth daily.  . traMADol (ULTRAM) 50 MG tablet Take 1-2 tablets (50-100 mg total) by mouth every 6 (six) hours as needed for severe pain.  Marland Kitchen triamcinolone cream (KENALOG) 0.1 % triamcinolone acetonide 0.1 % topical cream  . zolpidem (AMBIEN) 5 MG tablet TAKE 1 TABLET BY MOUTH AT BEDTIME AS NEEDED FOR INSOMNIA     Allergies:   Patient has no known allergies.   Social History   Socioeconomic History  . Marital status: Married    Spouse name: Not on file  . Number of children: Not on file  . Years of education: Not on file  . Highest education level: Not on file  Occupational History  . Occupation: Retired  Tobacco Use  . Smoking status: Never Smoker  . Smokeless tobacco: Never Used  Substance and Sexual Activity  . Alcohol use: Yes    Alcohol/week: 0.0 standard drinks    Comment: rarely, social  . Drug use: No  . Sexual activity: Not on file  Other Topics Concern  . Not on file  Social History Narrative   Working part time, contemplating retirement end of 2016   Lives at home with spouse      Exercise: water exercises   Social Determinants of Health   Financial Resource Strain:   .  Difficulty of Paying Living Expenses:   Food Insecurity:   . Worried About Charity fundraiser in the Last Year:   . Arboriculturist in the Last Year:   Transportation Needs:   . Film/video editor (Medical):   Marland Kitchen Lack of Transportation (Non-Medical):   Physical Activity:   . Days of Exercise per Week:   . Minutes of Exercise per Session:   Stress:   . Feeling of Stress :   Social Connections:   . Frequency of Communication with Friends and  Family:   . Frequency of Social Gatherings with Friends and Family:   . Attends Religious Services:   . Active Member of Clubs or Organizations:   . Attends Archivist Meetings:   Marland Kitchen Marital Status:      Family History: The patient's family history includes ALS in his maternal grandfather; Arthritis in an other family member; Dementia in his mother; Diabetes in his father; Heart disease (age of onset: 64) in his father; Hypertension in his father. There is no history of Colon cancer.  ROS:   Please see the history of present illness.    ROS  All other systems reviewed and negative.   EKGs/Labs/Other Studies Reviewed:    The following studies were reviewed today: none  EKG:  EKG is  ordered today.  The ekg ordered today demonstrates sinus bradycardia at 57bpm  Recent Labs: 11/03/2019: TSH 0.92 12/29/2019: ALT 92; BUN 15; Hemoglobin 14.5; Platelets 158; Potassium 4.6; Sodium 141 02/28/2020: Creatinine, Ser 0.70   Recent Lipid Panel    Component Value Date/Time   CHOL 97 11/03/2019 0815   CHOL 80 (L) 02/21/2019 1002   TRIG 49.0 11/03/2019 0815   HDL 35.40 (L) 11/03/2019 0815   HDL 36 (L) 02/21/2019 1002   CHOLHDL 3 11/03/2019 0815   VLDL 9.8 11/03/2019 0815   LDLCALC 51 11/03/2019 0815   LDLCALC 34 02/21/2019 1002   LDLDIRECT 153.1 06/09/2011 0942    Physical Exam:    VS:  BP 138/86   Pulse (!) 57   Ht 6' (1.829 m)   Wt 265 lb (120.2 kg)   BMI 35.94 kg/m     Wt Readings from Last 3 Encounters:  04/16/20 265  lb (120.2 kg)  01/04/20 257 lb (116.6 kg)  12/29/19 258 lb (117 kg)     GEN:  Well nourished, well developed in no acute distress HEENT: Normal NECK: No JVD; No carotid bruits LYMPHATICS: No lymphadenopathy CARDIAC: RRR, no murmurs, rubs, gallops RESPIRATORY:  Clear to auscultation without rales, wheezing or rhonchi  ABDOMEN: Soft, non-tender, non-distended MUSCULOSKELETAL:  No edema; No deformity  SKIN: Warm and dry NEUROLOGIC:  Alert and oriented x 3 PSYCHIATRIC:  Normal affect   ASSESSMENT:    1. Coronary artery disease involving native coronary artery of native heart without angina pectoris   2. Essential hypertension   3. Pure hypercholesterolemia   4. OSA treated with BiPAP    PLAN:    In order of problems listed above:  1.  ASCAD -s/p inferior STEMI with cath showing 95% PDA that was felt to be the culprit vessel. This was treated with PCI-DES. Also noted was CTO CFX with collaterals from the PDA on OM1. There was an unsuccessful attempt at opening the CFX.Also has 70% mid LAD lesion, to be treated medically.EF was 60% with inferobasilar HK. He has not had any anginal sx since I saw him last -continue ASA, Plavix 75mg  daily, statin and BB  2.  HTN -Bp is well controlled today -Continue Toprol XL 25mg  daily and Losartan 50mg  daily -creatinine 0.7 in march 2021 -repeat BMET in 6 months  3.  HLD -LDL goal < 70 -LDL was 51 in Nov 2020 -continue Atorvastatin 80mg  daily  4.  OSA -  The patient is tolerating PAP therapy well without any problems.  The patient has been using and benefiting from PAP use and will continue to benefit from therapy. I will get a download from his device    Medication Adjustments/Labs and Tests  Ordered: Current medicines are reviewed at length with the patient today.  Concerns regarding medicines are outlined above.  Orders Placed This Encounter  Procedures  . EKG 12-Lead   No orders of the defined types were placed in this  encounter.   Signed, Fransico Him, MD  04/16/2020 8:39 AM    [place]

## 2020-04-16 NOTE — Patient Instructions (Signed)
Medication Instructions:  Your physician recommends that you continue on your current medications as directed. Please refer to the Current Medication list given to you today.  *If you need a refill on your cardiac medications before your next appointment, please call your pharmacy*  Follow-Up: At [facility], you and your health needs are our priority.  As part of our continuing mission to provide you with exceptional heart care, we have created designated Provider Care Teams.  These Care Teams include your primary Cardiologist (physician) and Advanced Practice Providers (APPs -  Physician Assistants and Nurse Practitioners) who all work together to provide you with the care you need, when you need it.  Your next appointment:   6 month(s)  The format for your next appointment:   In Person  Provider:   Traci Turner, MD   

## 2020-04-23 ENCOUNTER — Other Ambulatory Visit: Payer: Self-pay | Admitting: Internal Medicine

## 2020-07-12 DIAGNOSIS — S76012A Strain of muscle, fascia and tendon of left hip, initial encounter: Secondary | ICD-10-CM | POA: Insufficient documentation

## 2020-07-22 ENCOUNTER — Other Ambulatory Visit: Payer: Self-pay | Admitting: Internal Medicine

## 2020-07-26 DIAGNOSIS — Z96642 Presence of left artificial hip joint: Secondary | ICD-10-CM | POA: Diagnosis not present

## 2020-07-26 DIAGNOSIS — Z471 Aftercare following joint replacement surgery: Secondary | ICD-10-CM | POA: Diagnosis not present

## 2020-09-10 ENCOUNTER — Telehealth: Payer: Self-pay | Admitting: Internal Medicine

## 2020-09-10 NOTE — Progress Notes (Signed)
  Chronic Care Management   Outreach Note  09/10/2020 Name: EDEL RIVERO MRN: [id number] DOB: 06-17-1951  Referred by: Binnie Rail, MD Reason for referral : No chief complaint on file.   An unsuccessful telephone outreach was attempted today. The patient was referred to the pharmacist for assistance with care management and care coordination.   Follow Up Plan:   Carley Perdue [employer] Scheduler

## 2020-09-11 ENCOUNTER — Ambulatory Visit: Payer: Medicare Other | Attending: Internal Medicine

## 2020-09-11 DIAGNOSIS — Z23 Encounter for immunization: Secondary | ICD-10-CM

## 2020-09-11 NOTE — Progress Notes (Signed)
   Covid-19 Vaccination Clinic  Name:  [name]    MRN: [id number] DOB: [birth year]  09/11/2020  [name] was observed post Covid-19 immunization for 15 minutes without incident. He was provided with Vaccine Information Sheet and instruction to access the V-Safe system.   [name] was instructed to call 911 with any severe reactions post vaccine: Marland Kitchen Difficulty breathing  . Swelling of face and throat  . A fast heartbeat  . A bad rash all over body  . Dizziness and weakness

## 2020-09-17 DIAGNOSIS — R972 Elevated prostate specific antigen [PSA]: Secondary | ICD-10-CM | POA: Diagnosis not present

## 2020-09-24 ENCOUNTER — Telehealth: Payer: Self-pay | Admitting: Internal Medicine

## 2020-09-24 NOTE — Progress Notes (Signed)
°  Chronic Care Management   Note  09/24/2020 Name: LUNDON VERDEJO MRN: [id number] DOB: 02-Jan-1951  SAMYAK SACKMANN is a 69 y.o. year old male who is a primary care patient of Burns, Claudina Lick, MD. I reached out to [name] by phone today in response to a referral sent by Mr. Tyreck Bell [facility]K PCP, Binnie Rail, MD.   Mr. Netherton was given information about Chronic Care Management services today including:  1. CCM service includes personalized support from designated clinical staff supervised by his physician, including individualized plan of care and coordination with other care providers 2. 24/7 contact phone numbers for assistance for urgent and routine care needs. 3. Service will only be billed when office clinical staff spend 20 minutes or more in a month to coordinate care. 4. Only one practitioner may furnish and bill the service in a calendar month. 5. The patient may stop CCM services at any time (effective at the end of the month) by phone call to the office staff.   Patient agreed to services and verbal consent obtained.   Follow up plan:   Carley Perdue [employer] Scheduler

## 2020-09-26 ENCOUNTER — Other Ambulatory Visit: Payer: Self-pay | Admitting: Cardiology

## 2020-09-28 ENCOUNTER — Other Ambulatory Visit: Payer: Self-pay | Admitting: Cardiology

## 2020-10-02 ENCOUNTER — Other Ambulatory Visit: Payer: Self-pay | Admitting: Cardiology

## 2020-10-10 ENCOUNTER — Ambulatory Visit (INDEPENDENT_AMBULATORY_CARE_PROVIDER_SITE_OTHER): Payer: Medicare Other | Admitting: *Deleted

## 2020-10-10 ENCOUNTER — Other Ambulatory Visit: Payer: Self-pay

## 2020-10-10 DIAGNOSIS — Z23 Encounter for immunization: Secondary | ICD-10-CM | POA: Diagnosis not present

## 2020-10-13 ENCOUNTER — Other Ambulatory Visit: Payer: Self-pay | Admitting: Internal Medicine

## 2020-10-15 NOTE — Progress Notes (Signed)
Cardiology Office Note:    Date:  10/16/2020   ID:  [name], DOB 06-Mar-1951, MRN [id number]  PCP:  Binnie Rail, MD  Cardiologist:  Fransico Him, MD    Referring MD: Binnie Rail, MD   Chief Complaint  Patient presents with  . Coronary Artery Disease  . Hypertension  . Atrial Fibrillation  . Hyperlipidemia  . Sleep Apnea    History of Present Illness:    [name] is a 69 y.o. male with a hx of ASCAD s/p inferior STEMI with cath showing 95% PDA that was felt to be the culprit vessel. This was treated with PCI-DES. Also noted was CTO CFX with collaterals from the PDA on OM1. There was an unsuccessful attempt at opening the CFX.Also has 70% mid LAD lesion, to be treated medically.EF was 60% with inferobasilar HK. Echo showed EF 65% with no WMA, grade 1 DD.He also has a hx of OSA on CPAP, obesity, HTN and PAF on anticoagulation.    He is here today for followup and is doing well.  He denies any chest pain or pressure, SOB, DOE, PND, orthopnea, LE edema, dizziness, palpitations or syncope. He is compliant with his meds and is tolerating meds with no SE.  He is doing well with his CPAP device and thinks that he has gotten used to it.  He tolerates the mask and feels the pressure is adequate.  Since going on CPAP he feels rested in the am and has no significant daytime sleepiness.  He denies any significant mouth or nasal dryness or nasal congestion.  He does not think that he snores.    Past Medical History:  Diagnosis Date  . BPH (benign prostatic hypertrophy)   . COLONIC POLYPS, HX OF 2007   clear colo w/o polyps 04/2015: 75yr follow up  . Coronary artery disease   . GERD (gastroesophageal reflux disease)   . Heartburn   . History of kidney stones    passed  . Hyperlipidemia   . HYPERTENSION   . HYPOTHYROIDISM   . Myocardial infarction ([place]) 12/2018   Inferior STEMI  . OA (osteoarthritis)    Knees  . OBESITY   . OSA treated with BiPAP   . PAF (paroxysmal  atrial fibrillation) ([place])   . Pre-diabetes   . Umbilical hernia   . Ventral hernia     Past Surgical History:  Procedure Laterality Date  . arthroscopic knee surgery     (R) 2003 & (L) 2010  . CARDIAC CATHETERIZATION    . COLONOSCOPY  04/2015   no polyps ([place])  . CORONARY/GRAFT ACUTE MI REVASCULARIZATION N/A 12/30/2018   Procedure: Coronary/Graft Acute MI Revascularization;  Surgeon: Belva Crome, MD;  Location: [facility];  Service: Cardiovascular;  Laterality: N/A;  . LAMINECTOMY  1991  . LEFT HEART CATH AND CORONARY ANGIOGRAPHY N/A 12/30/2018   Procedure: LEFT HEART CATH AND CORONARY ANGIOGRAPHY;  Surgeon: Belva Crome, MD;  Location: [facility];  Service: Cardiovascular;  Laterality: N/A;  . TONSILLECTOMY  1990   w/ adenoids and uvula  . TOTAL HIP ARTHROPLASTY Right 01/2011   [place]  . TOTAL HIP ARTHROPLASTY Left 04/16/2016   Procedure: TOTAL HIP ARTHROPLASTY ANTERIOR APPROACH;  Surgeon: Gaynelle Arabian, MD;  Location: [facility];  Service: Orthopedics;  Laterality: Left;  . TOTAL HIP REVISION Right 01/04/2020   Procedure: Right hip bearing surface;  Surgeon: Gaynelle Arabian, MD;  Location: [facility];  Service: Orthopedics;  Laterality:  Right;  136min  . TOTAL KNEE ARTHROPLASTY  12/27/2012   Procedure: TOTAL KNEE BILATERAL;  Surgeon: Gearlean Alf, MD;  Location: [facility];  Service: Orthopedics;  Laterality: Bilateral;    Current Medications: Current Meds  Medication Sig  . acetaminophen (TYLENOL) 325 MG tablet Take 2 tablets (650 mg total) by mouth every 4 (four) hours as needed for headache or mild pain.  Marland Kitchen aspirin EC 81 MG tablet Take 81 mg by mouth daily.  Marland Kitchen atorvastatin (LIPITOR) 80 MG tablet TAKE 1 TABLET (80 MG TOTAL) BY MOUTH DAILY AT 6 PM.  . clopidogrel (PLAVIX) 75 MG tablet Take 1 tablet (75 mg total) by mouth daily.  Marland Kitchen levothyroxine (SYNTHROID) 150 MCG tablet TAKE 1 TABLET BY MOUTH EVERY DAY BEFORE BREAKFAST  . losartan (COZAAR) 50 MG tablet TAKE 1 TABLET BY  MOUTH EVERY DAY  . methocarbamol (ROBAXIN) 500 MG tablet Take 1 tablet (500 mg total) by mouth every 6 (six) hours as needed for muscle spasms.  . metoprolol succinate (TOPROL-XL) 25 MG 24 hr tablet TAKE 1 TABLET BY MOUTH EVERY DAY  . Multiple Vitamins-Minerals (MULTIVITAMIN) tablet Take 1 tablet by mouth daily.  . nitroGLYCERIN (NITROSTAT) 0.4 MG SL tablet Place 1 tablet (0.4 mg total) under the tongue every 5 (five) minutes as needed for chest pain.  . tamsulosin (FLOMAX) 0.4 MG CAPS capsule Take 0.4 mg by mouth daily.     Allergies:   Patient has no known allergies.   Social History   Socioeconomic History  . Marital status: Married    Spouse name: Not on file  . Number of children: Not on file  . Years of education: Not on file  . Highest education level: Not on file  Occupational History  . Occupation: Retired  Tobacco Use  . Smoking status: Never Smoker  . Smokeless tobacco: Never Used  Vaping Use  . Vaping Use: Never used  Substance and Sexual Activity  . Alcohol use: Yes    Alcohol/week: 0.0 standard drinks    Comment: rarely, social  . Drug use: No  . Sexual activity: Not on file  Other Topics Concern  . Not on file  Social History Narrative   Working part time, contemplating retirement end of 2016   Lives at home with spouse      Exercise: water exercises   Social Determinants of Health   Financial Resource Strain:   . Difficulty of Paying Living Expenses: Not on file  Food Insecurity:   . Worried About Charity fundraiser in the Last Year: Not on file  . Ran Out of Food in the Last Year: Not on file  Transportation Needs:   . Lack of Transportation (Medical): Not on file  . Lack of Transportation (Non-Medical): Not on file  Physical Activity:   . Days of Exercise per Week: Not on file  . Minutes of Exercise per Session: Not on file  Stress:   . Feeling of Stress : Not on file  Social Connections:   . Frequency of Communication with Friends and Family:  Not on file  . Frequency of Social Gatherings with Friends and Family: Not on file  . Attends Religious Services: Not on file  . Active Member of Clubs or Organizations: Not on file  . Attends Archivist Meetings: Not on file  . Marital Status: Not on file     Family History: The patient's family history includes ALS in his maternal grandfather; Arthritis in an other family  member; Dementia in his mother; Diabetes in his father; Heart disease (age of onset: 67) in his father; Hypertension in his father. There is no history of Colon cancer.  ROS:   Please see the history of present illness.    ROS  All other systems reviewed and negative.   EKGs/Labs/Other Studies Reviewed:    The following studies were reviewed today: none  EKG:  EKG is not ordered today  Recent Labs: 11/03/2019: TSH 0.92 12/29/2019: Hemoglobin 14.5; Platelets 158 04/16/2020: ALT 66; BUN 14; Creatinine, Ser 0.71; Potassium 4.2; Sodium 143   Recent Lipid Panel    Component Value Date/Time   CHOL 99 (L) 04/16/2020 0818   TRIG 41 04/16/2020 0818   HDL 41 04/16/2020 0818   CHOLHDL 2.4 04/16/2020 0818   CHOLHDL 3 11/03/2019 0815   VLDL 9.8 11/03/2019 0815   LDLCALC 47 04/16/2020 0818   LDLDIRECT 153.1 06/09/2011 0942    Physical Exam:    VS:  BP (!) 154/86   Pulse (!) 54   Ht 6' (1.829 m)   Wt 268 lb 9.6 oz (121.8 kg)   SpO2 97%   BMI 36.43 kg/m     Wt Readings from Last 3 Encounters:  10/16/20 268 lb 9.6 oz (121.8 kg)  04/16/20 265 lb (120.2 kg)  01/04/20 257 lb (116.6 kg)     GEN: Well nourished, well developed in no acute distress HEENT: Normal NECK: No JVD; No carotid bruits LYMPHATICS: No lymphadenopathy CARDIAC:RRR, no murmurs, rubs, gallops RESPIRATORY:  Clear to auscultation without rales, wheezing or rhonchi  ABDOMEN: Soft, non-tender, non-distended MUSCULOSKELETAL:  No edema; No deformity  SKIN: Warm and dry NEUROLOGIC:  Alert and oriented x 3 PSYCHIATRIC:  Normal affect     ASSESSMENT:    1. Coronary artery disease involving native coronary artery of native heart without angina pectoris   2. Essential hypertension   3. Pure hypercholesterolemia   4. OSA treated with BiPAP    PLAN:    In order of problems listed above:  1.  ASCAD -s/p inferior STEMI with cath showing 95% PDA that was felt to be the culprit vessel. This was treated with PCI-DES. Also noted was CTO CFX with collaterals from the PDA on OM1. There was an unsuccessful attempt at opening the CFX.Also has 70% mid LAD lesion, to be treated medically.EF was 60% with inferobasilar HK. -he denies any anginal symptoms -continue ASA, Plavix 75mg  daily, statin and BB  2.  HTN -BP borderline controlled on exam today -Continue Toprol XL 25mg  daily  -increase Losartan to 100mg  daily -BMET in 1 week  3.  HLD -LDL goal < 70 -LDL was 51 in Nov 2020 -continue Atorvastatin 80mg  daily -check FLp and ALT  4.  OSA - The patient is tolerating PAP therapy well without any problems. The PAP download was reviewed today and showed an AHI of 5.2/hr on Auto BiPAP ASV  with 100% compliance in using more than 4 hours nightly.  The patient has been using and benefiting from PAP use and will continue to benefit from therapy.    Medication Adjustments/Labs and Tests Ordered: Current medicines are reviewed at length with the patient today.  Concerns regarding medicines are outlined above.  No orders of the defined types were placed in this encounter.  No orders of the defined types were placed in this encounter.   Signed, Fransico Him, MD  10/16/2020 8:21 AM    [place]

## 2020-10-16 ENCOUNTER — Encounter: Payer: Self-pay | Admitting: Cardiology

## 2020-10-16 ENCOUNTER — Ambulatory Visit (INDEPENDENT_AMBULATORY_CARE_PROVIDER_SITE_OTHER): Payer: Medicare Other | Admitting: Cardiology

## 2020-10-16 ENCOUNTER — Other Ambulatory Visit: Payer: Self-pay

## 2020-10-16 VITALS — BP 154/86 | HR 54 | Ht 72.0 in | Wt 268.6 lb

## 2020-10-16 DIAGNOSIS — Z9861 Coronary angioplasty status: Secondary | ICD-10-CM | POA: Diagnosis not present

## 2020-10-16 DIAGNOSIS — I2510 Atherosclerotic heart disease of native coronary artery without angina pectoris: Secondary | ICD-10-CM

## 2020-10-16 DIAGNOSIS — I10 Essential (primary) hypertension: Secondary | ICD-10-CM

## 2020-10-16 DIAGNOSIS — E7800 Pure hypercholesterolemia, unspecified: Secondary | ICD-10-CM

## 2020-10-16 DIAGNOSIS — G4733 Obstructive sleep apnea (adult) (pediatric): Secondary | ICD-10-CM | POA: Diagnosis not present

## 2020-10-16 MED ORDER — LOSARTAN POTASSIUM 100 MG PO TABS: 100.0000 mg | ORAL_TABLET | Freq: Every day | ORAL | 3 refills | Status: DC

## 2020-10-16 NOTE — Patient Instructions (Addendum)
Medication Instructions:  Your physician has recommended you make the following change in your medication:  1) INCREASE losartan to 100 mg daily  *If you need a refill on your cardiac medications before your next appointment, please call your pharmacy*  Lab Work: BMET in one week.  If you have labs (blood work) drawn today and your tests are completely normal, you will receive your results only by: Marland Kitchen MyChart Message (if you have MyChart) OR . A paper copy in the mail If you have any lab test that is abnormal or we need to change your treatment, we will call you to review the results.  Follow-Up: At [facility], you and your health needs are our priority.  As part of our continuing mission to provide you with exceptional heart care, we have created designated Provider Care Teams.  These Care Teams include your primary Cardiologist (physician) and Advanced Practice Providers (APPs -  Physician Assistants and Nurse Practitioners) who all work together to provide you with the care you need, when you need it.  Your next appointment:   6 month(s)  The format for your next appointment:   In Person  Provider:   You may see Fransico Him, MD or one of the following Advanced Practice Providers on your designated Care Team:    Melina Copa, PA-C  Ermalinda Barrios, PA-C

## 2020-10-24 ENCOUNTER — Other Ambulatory Visit: Payer: Medicare Other | Admitting: *Deleted

## 2020-10-24 ENCOUNTER — Other Ambulatory Visit: Payer: Self-pay

## 2020-10-24 DIAGNOSIS — I10 Essential (primary) hypertension: Secondary | ICD-10-CM

## 2020-10-24 LAB — BASIC METABOLIC PANEL
BUN/Creatinine Ratio: 14 (ref 10–24)
BUN: 11 mg/dL (ref 8–27)
CO2: 24 mmol/L (ref 20–29)
Calcium: 9.1 mg/dL (ref 8.6–10.2)
Chloride: 104 mmol/L (ref 96–106)
Creatinine, Ser: 0.78 mg/dL (ref 0.76–1.27)
GFR calc Af Amer: 108 mL/min/{1.73_m2} (ref >59)
GFR calc non Af Amer: 93 mL/min/{1.73_m2} (ref >59)
Glucose: 102 mg/dL — ABNORMAL HIGH (ref 65–99)
Potassium: 3.8 mmol/L (ref 3.5–5.2)
Sodium: 140 mmol/L (ref 134–144)

## 2020-11-02 DIAGNOSIS — Z9861 Coronary angioplasty status: Secondary | ICD-10-CM

## 2020-11-02 DIAGNOSIS — I2510 Atherosclerotic heart disease of native coronary artery without angina pectoris: Secondary | ICD-10-CM

## 2020-11-07 DIAGNOSIS — H35373 Puckering of macula, bilateral: Secondary | ICD-10-CM | POA: Diagnosis not present

## 2020-11-07 DIAGNOSIS — H5213 Myopia, bilateral: Secondary | ICD-10-CM | POA: Diagnosis not present

## 2020-11-14 ENCOUNTER — Ambulatory Visit: Payer: Medicare Other | Admitting: Pharmacist

## 2020-11-14 ENCOUNTER — Other Ambulatory Visit: Payer: Self-pay | Admitting: Internal Medicine

## 2020-11-14 ENCOUNTER — Other Ambulatory Visit: Payer: Self-pay

## 2020-11-14 DIAGNOSIS — I2510 Atherosclerotic heart disease of native coronary artery without angina pectoris: Secondary | ICD-10-CM

## 2020-11-14 DIAGNOSIS — I10 Essential (primary) hypertension: Secondary | ICD-10-CM

## 2020-11-14 DIAGNOSIS — M170 Bilateral primary osteoarthritis of knee: Secondary | ICD-10-CM

## 2020-11-14 DIAGNOSIS — E782 Mixed hyperlipidemia: Secondary | ICD-10-CM

## 2020-11-14 MED ORDER — TELMISARTAN 80 MG PO TABS: 80.0000 mg | ORAL_TABLET | Freq: Every day | ORAL | 1 refills | Status: DC

## 2020-11-14 NOTE — Progress Notes (Signed)
Recommendation to switch losartan to telmisartan 80 mg approved per cardiology. Routing to PCP for new Rx.

## 2020-11-14 NOTE — Patient Instructions (Addendum)
Visit Information  Phone number for Pharmacist: [phone]  Thank you for meeting with me to discuss your medications! I look forward to working with you to achieve your health care goals. Below is a summary of what we talked about during the visit:  Goals Addressed            This Visit's Progress   . Pharmacy Care Plan       CARE PLAN ENTRY (see longitudinal plan of care for additional care plan information)  Current Barriers:  . Chronic Disease Management support, education, and care coordination needs related to Hypertension, Hyperlipidemia, Coronary Artery Disease, and Osteoarthritis   Hypertension BP Readings from Last 3 Encounters:  10/16/20 (!) 154/86  04/16/20 138/86  01/04/20 (!) 145/80 .  Pharmacist Clinical Goal(s): o Over the next 180 days, patient will work with PharmD and providers to achieve BP goal <140/90 . Current regimen:  o Losartan 100 mg daily o Metoprolol succinate 25 mg daily . Interventions: o Discussed BP goals and benefits of medications for prevention of heart attack / stroke o Recommend switching losartan to telmisartan 80 mg for improved BP control  . Patient self care activities - Over the next 180 days, patient will: o Check BP daily, document, and provide at future appointments o Ensure daily salt intake < 2300 mg/day  Hyperlipidemia / CAD Lab Results  Component Value Date/Time   LDLCALC 47 04/16/2020 08:18 AM   LDLDIRECT 153.1 06/09/2011 09:42 AM .  Pharmacist Clinical Goal(s): o Over the next 180 days, patient will work with PharmD and providers to maintain LDL goal < 70 . Current regimen:  o Atorvastatin 80 mg daily o Nitroglycerin 0.4 mg SL as needed o Aspirin 81 mg daily o Clopidogrel 75 mg daily . Interventions: o Discussed cholesterol goals and benefits of medications for prevention of heart attack / stroke o Discussed bleeding risks associated with clopidogrel + aspirin and when to seek medical attention . Patient self  care activities - Over the next 180 days, patient will: o Continue current medications o Avoid NSAIDs o Seek medical attention if bleeding in urine or stool, or for head trauma   Osteoarthritis . Pharmacist Clinical Goal(s) o Over the next 180 days, patient will work with PharmD and providers to optimize therapy . Current regimen:  o Tylenol Arthritis 650 mg as needed . Interventions: o Discussed optimal dosing of Tylenol for pain management o Discussed benefits of Voltaren Gel  . Patient self care activities - Over the next 180 days, patient will: o Take Tylenol daily in AM for prophylaxis o Use Voltaren gel as needed  Medication management . Pharmacist Clinical Goal(s): o Over the next 180 days, patient will work with PharmD and providers to maintain optimal medication adherence . Current pharmacy: [facility] . Interventions o Comprehensive medication review performed. o Continue current medication management strategy . Patient self care activities - Over the next 180 days, patient will: o Focus on medication adherence by pill box o Take medications as prescribed o Report any questions or concerns to PharmD and/or provider(s)  Initial goal documentation      Mr. Magnussen was given information about Chronic Care Management services today including:  1. CCM service includes personalized support from designated clinical staff supervised by his physician, including individualized plan of care and coordination with other care providers 2. 24/7 contact phone numbers for assistance for urgent and routine care needs. 3. Standard insurance, coinsurance, copays and deductibles apply for chronic care management only during  months in which we provide at least 20 minutes of these services. Most insurances cover these services at 100%, however patients may be responsible for any copay, coinsurance and/or deductible if applicable. This service may help you avoid the need for more expensive face-to-face  services. 4. Only one practitioner may furnish and bill the service in a calendar month. 5. The patient may stop CCM services at any time (effective at the end of the month) by phone call to the office staff.  Patient agreed to services and verbal consent obtained.   The patient verbalized understanding of instructions, educational materials, and care plan provided today and agreed to receive a mailed copy of patient instructions, educational materials, and care plan.  Telephone follow up appointment with pharmacy team member scheduled for: 6 months  Charlene Brooke, PharmD, BCACP Clinical Pharmacist [facility] [phone]  [place] stands for "Dietary Approaches to Stop Hypertension." The DASH eating plan is a healthy eating plan that has been shown to reduce high blood pressure (hypertension). It may also reduce your risk for type 2 diabetes, heart disease, and stroke. The DASH eating plan may also help with weight loss. What are tips for following this plan?  General guidelines  Avoid eating more than 2,300 mg (milligrams) of salt (sodium) a day. If you have hypertension, you may need to reduce your sodium intake to 1,500 mg a day.  Limit alcohol intake to no more than 1 drink a day for nonpregnant women and 2 drinks a day for men. One drink equals 12 oz of beer, 5 oz of wine, or 1 oz of hard liquor.  Work with your health care provider to maintain a healthy body weight or to lose weight. Ask what an ideal weight is for you.  Get at least 30 minutes of exercise that causes your heart to beat faster (aerobic exercise) most days of the week. Activities may include walking, swimming, or biking.  Work with your health care provider or diet and nutrition specialist (dietitian) to adjust your eating plan to your individual calorie needs. Reading food labels   Check food labels for the amount of sodium per serving. Choose foods with less than 5  percent of the Daily Value of sodium. Generally, foods with less than 300 mg of sodium per serving fit into this eating plan.  To find whole grains, look for the word "whole" as the first word in the ingredient list. Shopping  Buy products labeled as "low-sodium" or "no salt added."  Buy fresh foods. Avoid canned foods and premade or frozen meals. Cooking  Avoid adding salt when cooking. Use salt-free seasonings or herbs instead of table salt or sea salt. Check with your health care provider or pharmacist before using salt substitutes.  Do not fry foods. Cook foods using healthy methods such as baking, boiling, grilling, and broiling instead.  Cook with heart-healthy oils, such as olive, canola, soybean, or sunflower oil. Meal planning  Eat a balanced diet that includes: ? 5 or more servings of fruits and vegetables each day. At each meal, try to fill half of your plate with fruits and vegetables. ? Up to 6-8 servings of whole grains each day. ? Less than 6 oz of lean meat, poultry, or fish each day. A 3-oz serving of meat is about the same size as a deck of cards. One egg equals 1 oz. ? 2 servings of low-fat dairy each day. ? A serving of nuts, seeds,  or beans 5 times each week. ? Heart-healthy fats. Healthy fats called Omega-3 fatty acids are found in foods such as flaxseeds and coldwater fish, like sardines, salmon, and mackerel.  Limit how much you eat of the following: ? Canned or prepackaged foods. ? Food that is high in trans fat, such as fried foods. ? Food that is high in saturated fat, such as fatty meat. ? Sweets, desserts, sugary drinks, and other foods with added sugar. ? Full-fat dairy products.  Do not salt foods before eating.  Try to eat at least 2 vegetarian meals each week.  Eat more home-cooked food and less restaurant, buffet, and fast food.  When eating at a restaurant, ask that your food be prepared with less salt or no salt, if possible. What foods are  recommended? The items listed may not be a complete list. Talk with your dietitian about what dietary choices are best for you. Grains Whole-grain or whole-wheat bread. Whole-grain or whole-wheat pasta. Brown rice. [place]. Bulgur. Whole-grain and low-sodium cereals. Pita bread. Low-fat, low-sodium crackers. Whole-wheat flour tortillas. Vegetables Fresh or frozen vegetables (raw, steamed, roasted, or grilled). Low-sodium or reduced-sodium tomato and vegetable juice. Low-sodium or reduced-sodium tomato sauce and tomato paste. Low-sodium or reduced-sodium canned vegetables. Fruits All fresh, dried, or frozen fruit. Canned fruit in natural juice (without added sugar). Meat and other protein foods Skinless chicken or [place]. Ground chicken or [place]. Pork with fat trimmed off. Fish and seafood. Egg whites. Dried beans, peas, or lentils. Unsalted nuts, nut butters, and seeds. Unsalted canned beans. Lean cuts of beef with fat trimmed off. Low-sodium, lean deli meat. Dairy Low-fat (1%) or fat-free (skim) milk. Fat-free, low-fat, or reduced-fat cheeses. Nonfat, low-sodium ricotta or cottage cheese. Low-fat or nonfat yogurt. Low-fat, low-sodium cheese. Fats and oils Soft margarine without trans fats. Vegetable oil. Low-fat, reduced-fat, or light mayonnaise and salad dressings (reduced-sodium). Canola, safflower, olive, soybean, and sunflower oils. Avocado. Seasoning and other foods Herbs. Spices. Seasoning mixes without salt. Unsalted popcorn and pretzels. Fat-free sweets. What foods are not recommended? The items listed may not be a complete list. Talk with your dietitian about what dietary choices are best for you. Grains Baked goods made with fat, such as croissants, muffins, or some breads. Dry pasta or rice meal packs. Vegetables Creamed or fried vegetables. Vegetables in a cheese sauce. Regular canned vegetables (not low-sodium or reduced-sodium). Regular canned tomato sauce and paste (not  low-sodium or reduced-sodium). Regular tomato and vegetable juice (not low-sodium or reduced-sodium). Angie Fava. Olives. Fruits Canned fruit in a light or heavy syrup. Fried fruit. Fruit in cream or butter sauce. Meat and other protein foods Fatty cuts of meat. Ribs. Fried meat. Berniece Salines. Sausage. Bologna and other processed lunch meats. Salami. Fatback. Hotdogs. Bratwurst. Salted nuts and seeds. Canned beans with added salt. Canned or smoked fish. Whole eggs or egg yolks. Chicken or [place] with skin. Dairy Whole or 2% milk, cream, and half-and-half. Whole or full-fat cream cheese. Whole-fat or sweetened yogurt. Full-fat cheese. Nondairy creamers. Whipped toppings. Processed cheese and cheese spreads. Fats and oils Butter. Stick margarine. Lard. Shortening. Ghee. Bacon fat. Tropical oils, such as coconut, palm kernel, or palm oil. Seasoning and other foods Salted popcorn and pretzels. Onion salt, garlic salt, seasoned salt, table salt, and sea salt. Worcestershire sauce. Tartar sauce. Barbecue sauce. Teriyaki sauce. Soy sauce, including reduced-sodium. Steak sauce. Canned and packaged gravies. Fish sauce. Oyster sauce. Cocktail sauce. Horseradish that you find on the shelf. Ketchup. Mustard. Meat flavorings and tenderizers.  Bouillon cubes. Hot sauce and Tabasco sauce. Premade or packaged marinades. Premade or packaged taco seasonings. Relishes. Regular salad dressings. Where to find more information:  National Heart, Lung, and [place]: https://wilson-eaton.com/  American Heart Association: www.heart.org Summary  The DASH eating plan is a healthy eating plan that has been shown to reduce high blood pressure (hypertension). It may also reduce your risk for type 2 diabetes, heart disease, and stroke.  With the DASH eating plan, you should limit salt (sodium) intake to 2,300 mg a day. If you have hypertension, you may need to reduce your sodium intake to 1,500 mg a day.  When on the DASH eating plan,  aim to eat more fresh fruits and vegetables, whole grains, lean proteins, low-fat dairy, and heart-healthy fats.  Work with your health care provider or diet and nutrition specialist (dietitian) to adjust your eating plan to your individual calorie needs. This information is not intended to replace advice given to you by your health care provider. Make sure you discuss any questions you have with your health care provider. Document Revised: 11/27/2017 Document Reviewed: 12/08/2016 Elsevier Patient Education  2020 [employer].

## 2020-11-14 NOTE — Chronic Care Management (AMB) (Signed)
Chronic Care Management Pharmacy  Name: [name]  MRN: [id number] DOB: March 03, 1951   Chief Complaint/ HPI  [name],  69 y.o. , male presents for their Initial CCM visit with the clinical pharmacist In office.  PCP : [name] Rail, MD Patient Care Team: [name] Rail, MD as PCP - General (Internal Medicine) [name] Margarita, MD as PCP - Cardiology (Cardiology) [name] Arabian, MD as Consulting Physician (Orthopedic Surgery) [name] Clines, MD (Inactive) (Urology) Pyrtle, Lajuan Lines, MD as Consulting Physician (Gastroenterology) [name], [facility] as Pharmacist (Pharmacist)  Their chronic conditions include: Hypertension, Hyperlipidemia, Coronary Artery Disease, Hypothyroidism, Osteoarthritis and BPH, OSA on CPAP  Patient lives with wife. They are from [facility] and moved to [place] in 2009. Children and grandchildren are local and they take care of 26 year old grandchild 2-3 days per week. [name] swims for 1 hour 4-5 days per week and works outside in the yard frequently.  Office Visits: 11/03/19 [name] OV: pre-op clearance for R hip THA. OK for surgery in Jan. LFT slightly elevated, likely related to weight.   Consult Visit: 10/16/20 [name] (cardiology): f/u for ASCVD. Increased losartan to 100 mg d/t uncontrolled BP.  04/16/20 [name] (cardiology):   No Known Allergies  Medications: Outpatient Encounter Medications as of 11/14/2020  Medication Sig  . acetaminophen (TYLENOL) 325 MG tablet Take 2 tablets (650 mg total) by mouth every 4 (four) hours as needed for headache or mild pain.  Marland Kitchen aspirin EC 81 MG tablet Take 81 mg by mouth daily.  Marland Kitchen atorvastatin (LIPITOR) 80 MG tablet TAKE 1 TABLET (80 MG TOTAL) BY MOUTH DAILY AT 6 PM.  . cholecalciferol (VITAMIN D3) 25 MCG (1000 UNIT) tablet Take 1,000 Units by mouth daily.  . clopidogrel (PLAVIX) 75 MG tablet Take 1 tablet (75 mg total) by mouth daily.  Marland Kitchen levothyroxine (SYNTHROID) 150 MCG tablet TAKE 1 TABLET BY MOUTH  EVERY DAY BEFORE BREAKFAST  . losartan (COZAAR) 100 MG tablet Take 1 tablet (100 mg total) by mouth daily.  . metoprolol succinate (TOPROL-XL) 25 MG 24 hr tablet TAKE 1 TABLET BY MOUTH EVERY DAY  . Multiple Vitamins-Minerals (MULTIVITAMIN) tablet Take 1 tablet by mouth daily.  . nitroGLYCERIN (NITROSTAT) 0.4 MG SL tablet Place 1 tablet (0.4 mg total) under the tongue every 5 (five) minutes as needed for chest pain.  . tamsulosin (FLOMAX) 0.4 MG CAPS capsule Take 0.4 mg by mouth daily.  . [DISCONTINUED] methocarbamol (ROBAXIN) 500 MG tablet Take 1 tablet (500 mg total) by mouth every 6 (six) hours as needed for muscle spasms.   No facility-administered encounter medications on file as of 11/14/2020.    Wt Readings from Last 3 Encounters:  10/16/20 268 lb 9.6 oz (121.8 kg)  04/16/20 265 lb (120.2 kg)  01/04/20 257 lb (116.6 kg)    Current Diagnosis/Assessment:  SDOH Interventions     Most Recent Value  SDOH Interventions  Financial Strain Interventions Intervention Not Indicated      Goals Addressed            This Visit's Progress   . Pharmacy Care Plan       CARE PLAN ENTRY (see longitudinal plan of care for additional care plan information)  Current Barriers:  . Chronic Disease Management support, education, and care coordination needs related to Hypertension, Hyperlipidemia, Coronary Artery Disease, and Osteoarthritis   Hypertension BP Readings from Last 3 Encounters:  10/16/20 (!) 154/86  04/16/20 138/86  01/04/20 Marland Kitchen)  145/80 .  Pharmacist Clinical Goal(s): o Over the next 180 days, patient will work with PharmD and providers to achieve BP goal <140/90 . Current regimen:  o Losartan 100 mg daily o Metoprolol succinate 25 mg daily . Interventions: o Discussed BP goals and benefits of medications for prevention of heart attack / stroke o Recommend switching losartan to telmisartan 80 mg for improved BP control  . Patient self care activities - Over the next 180  days, patient will: o Check BP daily, document, and provide at future appointments o Ensure daily salt intake < 2300 mg/day  Hyperlipidemia / CAD Lab Results  Component Value Date/Time   LDLCALC 47 04/16/2020 08:18 AM   LDLDIRECT 153.1 06/09/2011 09:42 AM .  Pharmacist Clinical Goal(s): o Over the next 180 days, patient will work with PharmD and providers to maintain LDL goal < 70 . Current regimen:  o Atorvastatin 80 mg daily o Nitroglycerin 0.4 mg SL as needed o Aspirin 81 mg daily o Clopidogrel 75 mg daily . Interventions: o Discussed cholesterol goals and benefits of medications for prevention of heart attack / stroke o Discussed bleeding risks associated with clopidogrel + aspirin and when to seek medical attention . Patient self care activities - Over the next 180 days, patient will: o Continue current medications o Avoid NSAIDs o Seek medical attention if bleeding in urine or stool, or for head trauma   Osteoarthritis . Pharmacist Clinical Goal(s) o Over the next 180 days, patient will work with PharmD and providers to optimize therapy . Current regimen:  o Tylenol Arthritis 650 mg as needed . Interventions: o Discussed optimal dosing of Tylenol for pain management o Discussed benefits of Voltaren Gel  . Patient self care activities - Over the next 180 days, patient will: o Take Tylenol daily in AM for prophylaxis o Use Voltaren gel as needed  Medication management . Pharmacist Clinical Goal(s): o Over the next 180 days, patient will work with PharmD and providers to maintain optimal medication adherence . Current [facility] . Interventions o Comprehensive medication review performed. o Continue current medication management strategy . Patient self care activities - Over the next 180 days, patient will: o Focus on medication adherence by pill box o Take medications as prescribed o Report any questions or concerns to PharmD and/or provider(s)  Initial goal  documentation       Hypertension   BP goal is:  <130/80  Office blood pressures are  BP Readings from Last 3 Encounters:  10/16/20 (!) 154/86  04/16/20 138/86  01/04/20 (!) 145/80   Lab Results  Component Value Date   CREATININE 0.78 10/24/2020   BUN 11 10/24/2020   GFR 99.48 11/03/2019   GFRNONAA 93 10/24/2020   GFRAA 108 10/24/2020   NA 140 10/24/2020   K 3.8 10/24/2020   CALCIUM 9.1 10/24/2020   CO2 24 10/24/2020   Patient checks BP at home daily Patient home BP readings are ranging: 140s-150s/70s-80s  Patient has failed these meds in the past: n/a Patient is currently uncontrolled on the following medications:  . Losartan 100 mg daily . Metoprolol succinate 25 mg daily  We discussed diet and exercise extensively; pt uses salt-substitute (potassium-containing); he is about to enroll in weight mgmt program; discussed losartan is relatively weak BP agent compared to others in its class, he may benefit from switching ARBs; pt would like to discuss with cardiologist [name] before making changes.  Depending on patient's [employer] plan, Telmisartan may be Tier 2  or 3. New plan going into effect in January [facility]) has all ARBs on Tier 1 - ie copay for remainder of 2021 will be higher than copay for 2022.  Plan  Continue control with diet and exercise  Recommend to switch losartan to Telmisartan 80 mg for more potent BP-lowering effects    Hyperlipidemia / CAD   LDL goal < 70 Hx CAD, STEMI 12/2018 w/ DES. Switched Brilinta > Plavix after 1 year.  Last lipids Lab Results  Component Value Date   CHOL 99 (L) 04/16/2020   HDL 41 04/16/2020   LDLCALC 47 04/16/2020   LDLDIRECT 153.1 06/09/2011   TRIG 41 04/16/2020   CHOLHDL 2.4 04/16/2020   Hepatic Function Latest Ref Rng & Units 04/16/2020 12/29/2019 11/03/2019  Total Protein 6.0 - 8.5 g/dL 7.1 8.0 7.7  Albumin 3.8 - 4.8 g/dL 4.2 4.2 4.2  AST 0 - 40 IU/L 35 62(H) 40(H)  ALT 0 - 44 IU/L 66(H) 92(H) 54(H)    Alk Phosphatase 39 - 117 IU/L 92 103 105  Total Bilirubin 0.0 - 1.2 mg/dL 0.3 1.1 0.5  Bilirubin, Direct 0.00 - 0.40 mg/dL 0.14 - -    The ASCVD Risk score Mikey Bussing DC Jr., et al., 2013) failed to calculate for the following reasons:   The patient has a prior MI or stroke diagnosis   Patient has failed these meds in past: n/a Patient is currently controlled on the following medications:  . Atorvastatin 80 mg daily . Nitroglycerin 0.4 mg SL prn . Aspirin 81 mg daily . Clopidogrel 75 mg daily  We discussed:  diet and exercise extensively ; Cholesterol goals; benefits of statin for ASCVD risk reduction; discussed bleeding risks associated with DAPT; pt endorses some nuisance bleeding, denies serious bleeding events;  Plan  Continue current medications and control with diet and exercise  BPH   PSA  Date Value Ref Range Status  12/15/2011 5.72 (H) <=4.00 ng/mL Final    Comment:    Test Methodology: ECLIA PSA (Electrochemiluminescence Immunoassay)  06/09/2011 5.05 (H) 0.10 - 4.00 ng/mL Final  09/04/2009 2.07 0.10 - 4.00 ng/mL Final     Patient has failed these meds in past: n/a Patient is currently controlled on the following medications:  . Tamsulosin 0.4 mg daily  We discussed:  Pt follows with urologist every 6 months; Patient is satisfied with current regimen and denies issues  Plan  Continue current medications  Hypothyroidism   Lab Results  Component Value Date/Time   TSH 0.92 11/03/2019 08:15 AM   TSH 3.809 12/30/2018 09:37 PM   TSH 1.04 08/19/2018 08:34 AM   FREET4 0.88 08/19/2018 08:34 AM   FREET4 0.79 07/02/2017 02:43 PM    Patient has failed these meds in past: n/a Patient is currently controlled on the following medications:  . Levothyroxine 150 mcg daily  We discussed:  S/sx of hypo- and hyperthyroidism; pt is due for repeat TSH; advised to make PCP appt  Plan  Continue current medications  Pain   Osteoarthritis - hip and knee Hx hip replacement,  failed  Patient has failed these meds in past: n/a Patient is currently controlled on the following medications: . Tylenol Arthritis 650 mg PRN  We discussed:  Pt reports using Tylenol 1-2 times per week; he avoids NSAIDs due to DAPT; discussed optimal tylenol dosing and benefits of voltaren gel  Plan  Recommend Tylenol Arthritis 1-2 tab daily  Recommend Voltaren 1% gel as needed  Prediabetes   A1c goal <6.5%  Recent  Relevant Labs: Lab Results  Component Value Date/Time   HGBA1C 5.6 12/29/2019 11:23 AM   HGBA1C 6.1 11/03/2019 08:15 AM   GFR 99.48 11/03/2019 08:15 AM   GFR 80.60 08/19/2018 08:34 AM    Last diabetic Eye exam: No results found for: HMDIABEYEEXA  Last diabetic Foot exam: No results found for: HMDIABFOOTEX   No medication indicated.  We discussed: diet and exercise extensively  Plan  Continue control with diet and exercise  Health Maintenance   Patient is currently controlled on the following medications:  Marland Kitchen Multivitamin . Vitamin D 1000 IU daily  We discussed:  Patient is satisfied with current regimen and denies issues  Plan  Continue current medications  Medication Management   [facility] Uses pill box? Yes Pt endorses 100% compliance  We discussed: Current pharmacy is preferred with insurance plan and patient is satisfied with pharmacy services; pt is switching to [facility] for cost savings January 2022, [facility]  Plan  Continue current medication management strategy    Follow up: 6 month phone visit  Charlene Brooke, PharmD, BCACP Clinical Pharmacist [facility] [phone]

## 2020-11-16 ENCOUNTER — Other Ambulatory Visit: Payer: Self-pay

## 2020-11-16 DIAGNOSIS — I10 Essential (primary) hypertension: Secondary | ICD-10-CM

## 2020-11-19 ENCOUNTER — Other Ambulatory Visit: Payer: Self-pay | Admitting: Cardiology

## 2020-11-26 ENCOUNTER — Encounter (HOSPITAL_COMMUNITY): Payer: Self-pay | Admitting: Student

## 2020-11-26 ENCOUNTER — Other Ambulatory Visit: Payer: Self-pay

## 2020-11-26 ENCOUNTER — Emergency Department (HOSPITAL_COMMUNITY)
Admission: EM | Admit: 2020-11-26 | Discharge: 2020-11-26 | Disposition: A | Discharge status: Home / Self Care | Payer: Medicare Other | Attending: Emergency Medicine | Admitting: Emergency Medicine

## 2020-11-26 ENCOUNTER — Emergency Department (HOSPITAL_COMMUNITY): Payer: Medicare Other

## 2020-11-26 DIAGNOSIS — Z7982 Long term (current) use of aspirin: Secondary | ICD-10-CM | POA: Diagnosis not present

## 2020-11-26 DIAGNOSIS — X58XXXA Exposure to other specified factors, initial encounter: Secondary | ICD-10-CM | POA: Insufficient documentation

## 2020-11-26 DIAGNOSIS — T84020A Dislocation of internal right hip prosthesis, initial encounter: Secondary | ICD-10-CM | POA: Diagnosis not present

## 2020-11-26 DIAGNOSIS — Z7902 Long term (current) use of antithrombotics/antiplatelets: Secondary | ICD-10-CM | POA: Diagnosis not present

## 2020-11-26 DIAGNOSIS — M25551 Pain in right hip: Secondary | ICD-10-CM | POA: Diagnosis not present

## 2020-11-26 DIAGNOSIS — Z96653 Presence of artificial knee joint, bilateral: Secondary | ICD-10-CM | POA: Insufficient documentation

## 2020-11-26 DIAGNOSIS — I2510 Atherosclerotic heart disease of native coronary artery without angina pectoris: Secondary | ICD-10-CM | POA: Diagnosis not present

## 2020-11-26 DIAGNOSIS — Z951 Presence of aortocoronary bypass graft: Secondary | ICD-10-CM | POA: Diagnosis not present

## 2020-11-26 DIAGNOSIS — I10 Essential (primary) hypertension: Secondary | ICD-10-CM | POA: Insufficient documentation

## 2020-11-26 DIAGNOSIS — S79911A Unspecified injury of right hip, initial encounter: Secondary | ICD-10-CM | POA: Diagnosis present

## 2020-11-26 DIAGNOSIS — E039 Hypothyroidism, unspecified: Secondary | ICD-10-CM | POA: Insufficient documentation

## 2020-11-26 DIAGNOSIS — S73004A Unspecified dislocation of right hip, initial encounter: Secondary | ICD-10-CM | POA: Insufficient documentation

## 2020-11-26 DIAGNOSIS — Z79899 Other long term (current) drug therapy: Secondary | ICD-10-CM | POA: Insufficient documentation

## 2020-11-26 DIAGNOSIS — Z96641 Presence of right artificial hip joint: Secondary | ICD-10-CM | POA: Diagnosis not present

## 2020-11-26 MED ORDER — ONDANSETRON HCL 4 MG/2ML IJ SOLN
4.0000 mg | Freq: Once | INTRAMUSCULAR | Status: AC
  Administered 2020-11-26: 4 mg via INTRAVENOUS
  Filled 2020-11-26: qty 2

## 2020-11-26 MED ORDER — PROPOFOL 10 MG/ML IV BOLUS
0.5000 mg/kg | INTRAVENOUS | Status: DC | PRN
  Filled 2020-11-26: qty 20

## 2020-11-26 MED ORDER — HYDROMORPHONE HCL 1 MG/ML IJ SOLN
1.0000 mg | Freq: Once | INTRAMUSCULAR | Status: AC
  Administered 2020-11-26: 1 mg via INTRAVENOUS
  Filled 2020-11-26: qty 1

## 2020-11-26 MED ORDER — PROPOFOL 10 MG/ML IV BOLUS
INTRAVENOUS | Status: AC | PRN
  Administered 2020-11-26 (×3): 60.1 mg via INTRAVENOUS

## 2020-11-26 NOTE — ED Notes (Signed)
Pt discharged from this ED in stable condition at this time. All discharge instructions and follow up care reviewed with pt with no further questions at this time. Pt ambulatory with steady gait, clear speech.  

## 2020-11-26 NOTE — ED Provider Notes (Signed)
[facility] DEPT Provider Note   CSN: [id number] Arrival date & time: 11/26/20  1638     History Chief Complaint  Patient presents with  . Hip Pain  . Hip Injury    [name] is a 69 y.o. male.  HPI   Patient presents to the ED with complaints of right hip pain.  Patient has a history of prior total right hip replacement.  Patient ended up having a revision back in January 2021.  Patient states he was going to start the car this morning.  He was getting out of the vehicle when he turned and  experienced severe pain in his right hip.  Patient did not feel like he could put any weight on it as he experienced severe pain and it felt like his hip was sinking.  Patient had to use crutches that he had at home.  Denies any falls.  He denies any other injuries.  Past Medical History:  Diagnosis Date  . BPH (benign prostatic hypertrophy)   . COLONIC POLYPS, HX OF 2007   clear colo w/o polyps 04/2015: 20yr follow up  . Coronary artery disease   . GERD (gastroesophageal reflux disease)   . Heartburn   . History of kidney stones    passed  . Hyperlipidemia   . HYPERTENSION   . HYPOTHYROIDISM   . Myocardial infarction ([place]) 12/2018   Inferior STEMI  . OA (osteoarthritis)    Knees  . OBESITY   . OSA treated with BiPAP   . PAF (paroxysmal atrial fibrillation) ([place])   . Pre-diabetes   . Umbilical hernia   . Ventral hernia     Patient Active Problem List   Diagnosis Date Noted  . Failed total hip arthroplasty ([place]) 01/04/2020  . Preop examination 11/02/2019  . OSA treated with BiPAP 05/03/2019  . CAD S/P percutaneous coronary angioplasty 01/11/2019  . CAD (coronary artery disease), native coronary artery 01/11/2019  . HLD (hyperlipidemia) 01/11/2019  . Acute ST elevation myocardial infarction (STEMI) of inferior wall ([place]) 12/30/2018  . ST elevation myocardial infarction (STEMI) ([place]) 12/30/2018  . Coronary artery disease involving native  artery of transplanted heart with unstable angina pectoris ([place])   . Rash and nonspecific skin eruption 02/19/2018  . Sleep disorder 07/02/2017  . Prediabetes 05/11/2017  . Ventral hernia without obstruction or gangrene 05/11/2017  . Umbilical hernia without obstruction or gangrene 05/11/2017  . OA (osteoarthritis) of hip 04/16/2016  . Pre-syncope 02/22/2016  . Vertigo 02/22/2016  . Knee joint replacement status 01/12/2013  . OA (osteoarthritis) of knee 12/27/2012  . Benign prostatic hyperplasia   . Hypothyroidism 09/04/2009  . Obesity 09/04/2009  . Essential hypertension 09/04/2009  . ARTHRITIS 09/04/2009  . COLONIC POLYPS, HX OF 09/04/2009    Past Surgical History:  Procedure Laterality Date  . arthroscopic knee surgery     (R) 2003 & (L) 2010  . CARDIAC CATHETERIZATION    . COLONOSCOPY  04/2015   no polyps ([place])  . CORONARY/GRAFT ACUTE MI REVASCULARIZATION N/A 12/30/2018   Procedure: Coronary/Graft Acute MI Revascularization;  Surgeon: Belva Crome, MD;  Location: [facility];  Service: Cardiovascular;  Laterality: N/A;  . LAMINECTOMY  1991  . LEFT HEART CATH AND CORONARY ANGIOGRAPHY N/A 12/30/2018   Procedure: LEFT HEART CATH AND CORONARY ANGIOGRAPHY;  Surgeon: Belva Crome, MD;  Location: [facility];  Service: Cardiovascular;  Laterality: N/A;  . TONSILLECTOMY  1990   w/ adenoids and uvula  .  TOTAL HIP ARTHROPLASTY Right 01/2011   [place]  . TOTAL HIP ARTHROPLASTY Left 04/16/2016   Procedure: TOTAL HIP ARTHROPLASTY ANTERIOR APPROACH;  Surgeon: Gaynelle Arabian, MD;  Location: [facility];  Service: Orthopedics;  Laterality: Left;  . TOTAL HIP REVISION Right 01/04/2020   Procedure: Right hip bearing surface;  Surgeon: Gaynelle Arabian, MD;  Location: [facility];  Service: Orthopedics;  Laterality: Right;  170min  . TOTAL KNEE ARTHROPLASTY  12/27/2012   Procedure: TOTAL KNEE BILATERAL;  Surgeon: Gearlean Alf, MD;  Location: [facility];  Service: Orthopedics;  Laterality:  Bilateral;       Family History  Problem Relation Age of Onset  . Heart disease Father 34       CABG age 70  . Hypertension Father   . Diabetes Father   . Dementia Mother        ?ALS vs SNP  . ALS Maternal Grandfather   . Arthritis Other        Parent & grandparents  . Colon cancer Neg Hx     Social History   Tobacco Use  . Smoking status: Never Smoker  . Smokeless tobacco: Never Used  Vaping Use  . Vaping Use: Never used  Substance Use Topics  . Alcohol use: Yes    Alcohol/week: 0.0 standard drinks    Comment: rarely, social  . Drug use: No    Home Medications Prior to Admission medications   Medication Sig Start Date End Date Taking? Authorizing Provider  aspirin EC 81 MG tablet Take 81 mg by mouth daily.   Yes [provider]  atorvastatin (LIPITOR) 80 MG tablet TAKE 1 TABLET (80 MG TOTAL) BY MOUTH DAILY AT 6 PM. 12/14/19  Yes Kilroy, Luke K, PA-C  cholecalciferol (VITAMIN D3) 25 MCG (1000 UNIT) tablet Take 1,000 Units by mouth daily.   Yes [provider]  clopidogrel (PLAVIX) 75 MG tablet Take 1 tablet (75 mg total) by mouth daily. 10/02/20  Yes Turner, Eber Hong, MD  diclofenac Sodium (VOLTAREN) 1 % GEL Apply 2 g topically 4 (four) times daily as needed (pain).   Yes [provider]  levothyroxine (SYNTHROID) 150 MCG tablet TAKE 1 TABLET BY MOUTH EVERY DAY BEFORE BREAKFAST Patient taking differently: Take 150 mcg by mouth daily before breakfast.  10/15/20  Yes Burns, Claudina Lick, MD  metoprolol succinate (TOPROL-XL) 25 MG 24 hr tablet TAKE 1 TABLET BY MOUTH EVERY DAY Patient taking differently: Take 25 mg by mouth daily.  12/14/19  Yes Kilroy, Luke K, PA-C  Multiple Vitamins-Minerals (MULTIVITAMIN) tablet Take 1 tablet by mouth daily. 05/27/16  Yes Burns, Claudina Lick, MD  nitroGLYCERIN (NITROSTAT) 0.4 MG SL tablet PLACE 1 TABLET (0.4 MG TOTAL) UNDER THE TONGUE EVERY 5 (FIVE) MINUTES AS NEEDED FOR CHEST PAIN. 11/19/20  Yes Turner, Eber Hong, MD   polyvinyl alcohol (LIQUIFILM TEARS) 1.4 % ophthalmic solution Place 1 drop into both eyes as needed for dry eyes.   Yes [provider]  tamsulosin (FLOMAX) 0.4 MG CAPS capsule Take 0.4 mg by mouth daily. 03/21/20  Yes [provider]  telmisartan (MICARDIS) 80 MG tablet Take 1 tablet (80 mg total) by mouth daily. 11/14/20  Yes Binnie Rail, MD    Allergies    Patient has no known allergies.  Review of Systems   Review of Systems  All other systems reviewed and are negative.   Physical Exam Updated Vital Signs BP 139/77   Pulse (!) 52   Temp 97.9 F (36.6  C) (Oral)   Resp 13   Ht 1.829 m (6')   Wt 120.2 kg   SpO2 97%   BMI 35.94 kg/m   Physical Exam Vitals and nursing note reviewed.  Constitutional:      General: He is not in acute distress.    Appearance: He is well-developed.  HENT:     Head: Normocephalic and atraumatic.     Right Ear: External ear normal.     Left Ear: External ear normal.  Eyes:     General: No scleral icterus.       Right eye: No discharge.        Left eye: No discharge.     Conjunctiva/sclera: Conjunctivae normal.  Neck:     Trachea: No tracheal deviation.  Cardiovascular:     Rate and Rhythm: Normal rate.  Pulmonary:     Effort: Pulmonary effort is normal. No respiratory distress.     Breath sounds: No stridor.  Abdominal:     General: There is no distension.  Musculoskeletal:        General: No swelling.     Cervical back: Neck supple.     Right hip: Deformity and tenderness present. Decreased range of motion.     Comments: Right lower extremity appears shorter than left, distal neurovascular intact, limited range of motion due to pain  Skin:    General: Skin is warm and dry.     Findings: No rash.  Neurological:     Mental Status: He is alert.     Cranial Nerves: Cranial nerve deficit: no gross deficits.     ED Results / Procedures / Treatments   Labs (all labs ordered are listed, but only abnormal  results are displayed) Labs Reviewed - No data to display  EKG None  Radiology DG Hip [place] W or [place] Pelvis 1 View Right  Result Date: 11/26/2020 CLINICAL DATA:  Status post reduction of a right hip arthroplasty dislocation. EXAM: DG HIP (WITH OR WITHOUT PELVIS) 1V PORT RIGHT COMPARISON:  11/26/2020 at 10:28 a.m. FINDINGS: On the single AP view, the femoral head portion of the right hip arthroplasty is well aligned with the acetabular component. No acute fracture. IMPRESSION: Successful reduction of the dislocated right hip prosthesis. Electronically Signed   By: Lajean Manes M.D.   On: 11/26/2020 12:01   DG Hip Unilat  With Pelvis 2-3 Views Right  Result Date: 11/26/2020 CLINICAL DATA:  Onset right hip pain when the patient felt a pop in the hip when he was getting out of his car this morning. Initial encounter. EXAM: DG HIP (WITH OR WITHOUT PELVIS) 2-3V RIGHT COMPARISON:  Plain films right hip 01/29/2011. FINDINGS: The patient has a right total hip arthroplasty. The device is posterosuperiorly dislocated. No fracture. IMPRESSION: Posterosuperior dislocation of a right hip arthroplasty. Electronically Signed   By: Inge Rise M.D.   On: 11/26/2020 10:41    Procedures .Sedation  Date/Time: 11/26/2020 11:30 AM Performed by: Dorie Rank, MD Authorized by: Dorie Rank, MD   Consent:    Consent obtained:  Verbal   Consent given by:  Patient   Risks discussed:  Allergic reaction, dysrhythmia, inadequate sedation, nausea, prolonged hypoxia resulting in organ damage, prolonged sedation necessitating reversal, respiratory compromise necessitating ventilatory assistance and intubation and vomiting   Alternatives discussed:  Analgesia without sedation, anxiolysis and regional anesthesia Universal protocol:    Procedure explained and questions answered to patient or proxy's satisfaction: yes     Relevant documents present  and verified: yes     Test results available and properly  labeled: yes     Imaging studies available: yes     Required blood products, implants, devices, and special equipment available: yes     Site/side marked: yes     Immediately prior to procedure a time out was called: yes     Patient identity confirmation method:  Verbally with patient Indications:    Procedure necessitating sedation performed by:  Physician performing sedation Pre-sedation assessment:    Time since last food or drink:  6   ASA classification: class 2 - patient with mild systemic disease     Neck mobility: normal     Mouth opening:  3 or more finger widths   Thyromental distance:  4 finger widths   Mallampati score:  I - soft palate, uvula, fauces, pillars visible   Pre-sedation assessments completed and reviewed: airway patency, cardiovascular function, hydration status, mental status, nausea/vomiting, pain level, respiratory function and temperature     Pre-sedation assessment completed:  11/26/2020 11:20 AM Immediate pre-procedure details:    Reassessment: Patient reassessed immediately prior to procedure     Reviewed: vital signs, relevant labs/tests and NPO status     Verified: bag valve mask available, emergency equipment available, intubation equipment available, IV patency confirmed, oxygen available and suction available   Procedure details (see MAR for exact dosages):    Preoxygenation:  Nasal cannula   Sedation:  Propofol   Intended level of sedation: deep   Analgesia:  Hydromorphone   Intra-procedure monitoring:  Blood pressure monitoring, cardiac monitor, continuous pulse oximetry, frequent LOC assessments, frequent vital sign checks and continuous capnometry   Intra-procedure events: none     Intra-procedure management:  Airway repositioning and supplemental oxygen   Total Provider sedation time (minutes):  20 Post-procedure details:    Post-sedation assessment completed:  11/26/2020 11:54 AM   Attendance: Constant attendance by certified staff until  patient recovered     Recovery: Patient returned to pre-procedure baseline     Post-sedation assessments completed and reviewed: airway patency, cardiovascular function, hydration status, mental status, nausea/vomiting, pain level, respiratory function and temperature     Patient is stable for discharge or admission: yes     Patient tolerance:  Tolerated well, no immediate complications .Ortho Injury Treatment  Date/Time: 11/26/2020 11:54 AM Performed by: Dorie Rank, MD Authorized by: Dorie Rank, MD   Consent:    Consent obtained:  Written   Consent given by:  Patient   Risks discussed:  Nerve damage and fracture   Alternatives discussed:  Delayed treatmentPre-procedure neurovascular assessment: neurovascularly intact Pre-procedure distal perfusion: normal Pre-procedure neurological function: normal Pre-procedure range of motion: reduced  Patient sedated: Yes. Refer to sedation procedure documentation for details of sedation. Immobilization: Knee immobilizer. Post-procedure neurovascular assessment: post-procedure neurovascularly intact Post-procedure distal perfusion: normal Post-procedure neurological function: normal Post-procedure range of motion: normal    (including critical care time)  Medications Ordered in ED Medications  propofol (DIPRIVAN) 10 mg/mL bolus/IV push 60.1 mg (has no administration in time range)  HYDROmorphone (DILAUDID) injection 1 mg (1 mg Intravenous Given 11/26/20 1033)  ondansetron (ZOFRAN) injection 4 mg (4 mg Intravenous Given 11/26/20 1032)  propofol (DIPRIVAN) 10 mg/mL bolus/IV push (60.1 mg Intravenous Given 11/26/20 1141)    ED Course  I have reviewed the triage vital signs and the nursing notes.  Pertinent labs & imaging results that were available during my care of the patient were reviewed by me and considered in  my medical decision making (see chart for details).  Clinical Course as of Nov 26 1305  Mon Nov 26, 2020  1043 X-rays  reviewed.  Patient does a have dislocation of his prosthetic hip joint   [JK]  26 Dr Kathrynn Humble assisted directly at the bedside during the reduction procedure.   [JK]  1232 Post reduction films reviewed.  Hip successfully reduced   [JK]    Clinical Course User Index [JK] Dorie Rank, MD   MDM Rules/Calculators/A&P                         Patient presented to the ED for evaluation of a prosthetic hip dislocation.  This was the first time that has occurred for the patient since his surgery.  Patient's x-rays confirmed a hip dislocation.  Patient underwent procedural sedation in the ED.  Hip dislocation was successfully reduced with the assistance of Dr. Kathrynn Humble.  Patient is stable for discharge at this time.  Plan on discharge home with crutches and knee immobilizer.  Discussed close outpatient follow-up with his orthopedic surgeon, Dr. Wynelle Link.  Final Clinical Impression(s) / ED Diagnoses Final diagnoses:  Dislocation of right hip, initial encounter [facility])    Rx / DC Orders ED Discharge Orders    None       Dorie Rank, MD 11/26/20 1307

## 2020-11-26 NOTE — ED Triage Notes (Signed)
Patient BIB POV after possible right hip dislocation.  Patient had a right total hip replacement 5-6 years ago.  Patient then had a revision of that right hip in January 2021.  Patient went outside to start his wife's car and when he was getting out of the car, he noticed that his right leg was not move like it should and was painful. Patient has 6/10 pain.

## 2020-11-26 NOTE — Discharge Instructions (Addendum)
Use the knee immobilizer and crutches as we discussed.  Contact Dr. Anne Fu office to schedule a follow-up appointment

## 2020-11-26 NOTE — ED Notes (Signed)
Pt alert and oriented, neurologically back to baseline.

## 2020-11-28 ENCOUNTER — Other Ambulatory Visit: Payer: Self-pay | Admitting: Cardiology

## 2020-12-05 ENCOUNTER — Encounter (INDEPENDENT_AMBULATORY_CARE_PROVIDER_SITE_OTHER): Payer: Self-pay

## 2020-12-10 ENCOUNTER — Other Ambulatory Visit: Payer: Self-pay | Admitting: Internal Medicine

## 2020-12-10 NOTE — Progress Notes (Signed)
Subjective:    Patient ID: [name], male    DOB: 25-Aug-1951, 69 y.o.   MRN: [id number]  HPI The patient is here for follow up of their chronic medical problems, including htn, CAD, s/ STEMI, OSA, prediabetes, hypothyroidism, hyperlipidemia  [facility]  He has gained weight.   He swims 4-5 times a week.  He has no concerns.  Medications and allergies reviewed with patient and updated if appropriate.  Patient Active Problem List   Diagnosis Date Noted  . Failed total hip arthroplasty ([place]) 01/04/2020  . Preop examination 11/02/2019  . OSA treated with BiPAP 05/03/2019  . CAD S/P percutaneous coronary angioplasty 01/11/2019  . CAD (coronary artery disease), native coronary artery 01/11/2019  . HLD (hyperlipidemia) 01/11/2019  . Acute ST elevation myocardial infarction (STEMI) of inferior wall ([place]) 12/30/2018  . ST elevation myocardial infarction (STEMI) ([place]) 12/30/2018  . 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 ([place])   . Rash and nonspecific skin eruption 02/19/2018  . Sleep disorder 07/02/2017  . Prediabetes 05/11/2017  . Ventral hernia without obstruction or gangrene 05/11/2017  . Umbilical hernia without obstruction or gangrene 05/11/2017  . OA (osteoarthritis) of hip 04/16/2016  . Pre-syncope 02/22/2016  . Vertigo 02/22/2016  . Knee joint replacement status 01/12/2013  . OA (osteoarthritis) of knee 12/27/2012  . Benign prostatic hyperplasia   . Hypothyroidism 09/04/2009  . Obesity 09/04/2009  . Essential hypertension 09/04/2009  . ARTHRITIS 09/04/2009  . COLONIC POLYPS, HX OF 09/04/2009    Current Outpatient Medications on File Prior to Visit  Medication Sig Dispense Refill  . aspirin EC 81 MG tablet Take 81 mg by mouth daily.    Marland Kitchen atorvastatin (LIPITOR) 80 MG tablet TAKE 1 TABLET (80 MG TOTAL) BY MOUTH DAILY AT 6 PM. 90 tablet 3  . cholecalciferol  (VITAMIN D3) 25 MCG (1000 UNIT) tablet Take 1,000 Units by mouth daily.    . clopidogrel (PLAVIX) 75 MG tablet Take 1 tablet (75 mg total) by mouth daily. 90 tablet 1  . diclofenac Sodium (VOLTAREN) 1 % GEL Apply 2 g topically 4 (four) times daily as needed (pain).    Marland Kitchen levothyroxine (SYNTHROID) 150 MCG tablet TAKE 1 TABLET BY MOUTH EVERY DAY BEFORE BREAKFAST (Patient taking differently: Take 150 mcg by mouth daily before breakfast.) 60 tablet 0  . metoprolol succinate (TOPROL-XL) 25 MG 24 hr tablet Take 1 tablet (25 mg total) by mouth daily. 90 tablet 3  . Multiple Vitamins-Minerals (MULTIVITAMIN) tablet Take 1 tablet by mouth daily.    . nitroGLYCERIN (NITROSTAT) 0.4 MG SL tablet PLACE 1 TABLET (0.4 MG TOTAL) UNDER THE TONGUE EVERY 5 (FIVE) MINUTES AS NEEDED FOR CHEST PAIN. 25 tablet 3  . polyvinyl alcohol (LIQUIFILM TEARS) 1.4 % ophthalmic solution Place 1 drop into both eyes as needed for dry eyes.    . tamsulosin (FLOMAX) 0.4 MG CAPS capsule Take 0.4 mg by mouth daily.    Marland Kitchen telmisartan (MICARDIS) 80 MG tablet Take 1 tablet (80 mg total) by mouth daily. 90 tablet 1  . losartan (COZAAR) 100 MG tablet losartan 100 mg tablet     No current facility-administered medications on file prior to visit.    Past Medical History:  Diagnosis Date  . BPH (benign prostatic hypertrophy)   . COLONIC POLYPS, HX OF 2007   clear colo w/o polyps 04/2015: 62yr follow up  . Coronary artery disease   .  GERD (gastroesophageal reflux disease)   . Heartburn   . History of kidney stones    passed  . Hyperlipidemia   . HYPERTENSION   . HYPOTHYROIDISM   . Myocardial infarction ([place]) 12/2018   Inferior STEMI  . OA (osteoarthritis)    Knees  . OBESITY   . OSA treated with BiPAP   . PAF (paroxysmal atrial fibrillation) ([place])   . Pre-diabetes   . Umbilical hernia   . Ventral hernia     Past Surgical History:  Procedure Laterality Date  . arthroscopic knee surgery     (R) 2003 & (L) 2010  . CARDIAC  CATHETERIZATION    . COLONOSCOPY  04/2015   no polyps ([place])  . CORONARY/GRAFT ACUTE MI REVASCULARIZATION N/A 12/30/2018   Procedure: Coronary/Graft Acute MI Revascularization;  Surgeon: Belva Crome, MD;  Location: [facility];  Service: Cardiovascular;  Laterality: N/A;  . LAMINECTOMY  1991  . LEFT HEART CATH AND CORONARY ANGIOGRAPHY N/A 12/30/2018   Procedure: LEFT HEART CATH AND CORONARY ANGIOGRAPHY;  Surgeon: Belva Crome, MD;  Location: [facility];  Service: Cardiovascular;  Laterality: N/A;  . TONSILLECTOMY  1990   w/ adenoids and uvula  . TOTAL HIP ARTHROPLASTY Right 01/2011   [place]  . TOTAL HIP ARTHROPLASTY Left 04/16/2016   Procedure: TOTAL HIP ARTHROPLASTY ANTERIOR APPROACH;  Surgeon: Gaynelle Arabian, MD;  Location: [facility];  Service: Orthopedics;  Laterality: Left;  . TOTAL HIP REVISION Right 01/04/2020   Procedure: Right hip bearing surface;  Surgeon: Gaynelle Arabian, MD;  Location: [facility];  Service: Orthopedics;  Laterality: Right;  171min  . TOTAL KNEE ARTHROPLASTY  12/27/2012   Procedure: TOTAL KNEE BILATERAL;  Surgeon: Gearlean Alf, MD;  Location: [facility];  Service: Orthopedics;  Laterality: Bilateral;    Social History   Socioeconomic History  . Marital status: Married    Spouse name: Not on file  . Number of children: Not on file  . Years of education: Not on file  . Highest education level: Not on file  Occupational History  . Occupation: Retired  Tobacco Use  . Smoking status: Never Smoker  . Smokeless tobacco: Never Used  Vaping Use  . Vaping Use: Never used  Substance and Sexual Activity  . Alcohol use: Yes    Alcohol/week: 0.0 standard drinks    Comment: rarely, social  . Drug use: No  . Sexual activity: Not on file  Other Topics Concern  . Not on file  Social History Narrative   Working part time, contemplating retirement end of 2016   Lives at home with spouse      Exercise: water exercises   Social Determinants of Health    Financial Resource Strain: Low Risk   . Difficulty of Paying Living Expenses: Not hard at all  Food Insecurity: Not on file  Transportation Needs: Not on file  Physical Activity: Not on file  Stress: Not on file  Social Connections: Not on file    Family History  Problem Relation Age of Onset  . Heart disease Father 35       CABG age 1  . Hypertension Father   . Diabetes Father   . Dementia Mother        ?ALS vs SNP  . ALS Maternal Grandfather   . Arthritis Other        Parent & grandparents  . Colon cancer Neg Hx     Review of Systems  Constitutional: Negative  for chills and fever.  Respiratory: Negative for cough, shortness of breath and wheezing.   Cardiovascular: Negative for chest pain, palpitations and leg swelling.  Neurological: Positive for headaches (occ). Negative for dizziness and light-headedness.       Objective:   Vitals:   12/11/20 0913  BP: 130/80  Pulse: (!) 58  Temp: 97.9 F (36.6 C)  SpO2: 98%   BP Readings from Last 3 Encounters:  12/11/20 130/80  11/26/20 139/77  10/16/20 (!) 154/86   Wt Readings from Last 3 Encounters:  12/11/20 276 lb (125.2 kg)  11/26/20 265 lb (120.2 kg)  10/16/20 268 lb 9.6 oz (121.8 kg)   Body mass index is 37.43 kg/m.   Physical Exam    Constitutional: Appears well-developed and well-nourished. No distress.  HENT:  Head: Normocephalic and atraumatic.  Neck: Neck supple. No tracheal deviation present. No thyromegaly present.  No cervical lymphadenopathy Cardiovascular: Normal rate, regular rhythm and normal heart sounds.   No murmur heard. No carotid bruit .  No edema Pulmonary/Chest: Effort normal and breath sounds normal. No respiratory distress. No has no wheezes. No rales.  Skin: Skin is warm and dry. Not diaphoretic.  Psychiatric: Normal mood and affect. Behavior is normal.      Assessment & Plan:    See Problem List for Assessment and Plan of chronic medical problems.    This visit  occurred during the SARS-CoV-2 public health emergency.  Safety protocols were in place, including screening questions prior to the visit, additional usage of staff PPE, and extensive cleaning of exam room while observing appropriate contact time as indicated for disinfecting solutions.

## 2020-12-10 NOTE — Patient Instructions (Addendum)
    Blood work was ordered.      Medications changes include :   none     Please followup in 6 months  

## 2020-12-11 ENCOUNTER — Ambulatory Visit (INDEPENDENT_AMBULATORY_CARE_PROVIDER_SITE_OTHER): Payer: Medicare Other | Admitting: Internal Medicine

## 2020-12-11 ENCOUNTER — Other Ambulatory Visit: Payer: Self-pay

## 2020-12-11 ENCOUNTER — Encounter: Payer: Self-pay | Admitting: Internal Medicine

## 2020-12-11 ENCOUNTER — Other Ambulatory Visit: Payer: Self-pay | Admitting: Cardiology

## 2020-12-11 VITALS — BP 130/80 | HR 58 | Temp 97.9°F | Ht 72.0 in | Wt 276.0 lb

## 2020-12-11 DIAGNOSIS — G4733 Obstructive sleep apnea (adult) (pediatric): Secondary | ICD-10-CM | POA: Diagnosis not present

## 2020-12-11 DIAGNOSIS — I2510 Atherosclerotic heart disease of native coronary artery without angina pectoris: Secondary | ICD-10-CM | POA: Diagnosis not present

## 2020-12-11 DIAGNOSIS — E782 Mixed hyperlipidemia: Secondary | ICD-10-CM

## 2020-12-11 DIAGNOSIS — E034 Atrophy of thyroid (acquired): Secondary | ICD-10-CM | POA: Diagnosis not present

## 2020-12-11 DIAGNOSIS — I10 Essential (primary) hypertension: Secondary | ICD-10-CM

## 2020-12-11 DIAGNOSIS — Z9861 Coronary angioplasty status: Secondary | ICD-10-CM

## 2020-12-11 DIAGNOSIS — R7303 Prediabetes: Secondary | ICD-10-CM | POA: Diagnosis not present

## 2020-12-11 LAB — COMPREHENSIVE METABOLIC PANEL
ALT: 305 U/L — ABNORMAL HIGH (ref 0–53)
AST: 177 U/L — ABNORMAL HIGH (ref 0–37)
Albumin: 4.1 g/dL (ref 3.5–5.2)
Alkaline Phosphatase: 105 U/L (ref 39–117)
BUN: 11 mg/dL (ref 6–23)
CO2: 26 mEq/L (ref 19–32)
Calcium: 9.5 mg/dL (ref 8.4–10.5)
Chloride: 105 mEq/L (ref 96–112)
Creatinine, Ser: 0.78 mg/dL (ref 0.40–1.50)
GFR: 92.31 mL/min (ref >60.00)
Glucose, Bld: 118 mg/dL — ABNORMAL HIGH (ref 70–99)
Potassium: 4.3 mEq/L (ref 3.5–5.1)
Sodium: 137 mEq/L (ref 135–145)
Total Bilirubin: 0.7 mg/dL (ref 0.2–1.2)
Total Protein: 7.6 g/dL (ref 6.0–8.3)

## 2020-12-11 LAB — CBC WITH DIFFERENTIAL/PLATELET
Basophils Absolute: 0.1 10*3/uL (ref 0.0–0.1)
Basophils Relative: 1.3 % (ref 0.0–3.0)
Eosinophils Absolute: 0.2 10*3/uL (ref 0.0–0.7)
Eosinophils Relative: 3.5 % (ref 0.0–5.0)
HCT: 43.1 % (ref 39.0–52.0)
Hemoglobin: 14.8 g/dL (ref 13.0–17.0)
Lymphocytes Relative: 19.3 % (ref 12.0–46.0)
Lymphs Abs: 0.9 10*3/uL (ref 0.7–4.0)
MCHC: 34.5 g/dL (ref 30.0–36.0)
MCV: 88.4 fl (ref 78.0–100.0)
Monocytes Absolute: 0.7 10*3/uL (ref 0.1–1.0)
Monocytes Relative: 15.3 % — ABNORMAL HIGH (ref 3.0–12.0)
Neutro Abs: 2.7 10*3/uL (ref 1.4–7.7)
Neutrophils Relative %: 60.6 % (ref 43.0–77.0)
Platelets: 141 10*3/uL — ABNORMAL LOW (ref 150.0–400.0)
RBC: 4.87 Mil/uL (ref 4.22–5.81)
RDW: 13.9 % (ref 11.5–15.5)
WBC: 4.4 10*3/uL (ref 4.0–10.5)

## 2020-12-11 LAB — LIPID PANEL
Cholesterol: 112 mg/dL (ref 0–200)
HDL: 34.5 mg/dL — ABNORMAL LOW (ref >39.00)
LDL Cholesterol: 61 mg/dL (ref 0–99)
NonHDL: 77.17
Total CHOL/HDL Ratio: 3
Triglycerides: 80 mg/dL (ref 0.0–149.0)
VLDL: 16 mg/dL (ref 0.0–40.0)

## 2020-12-11 LAB — TSH: TSH: 2.57 u[IU]/mL (ref 0.35–4.50)

## 2020-12-11 LAB — HEMOGLOBIN A1C: Hgb A1c MFr Bld: 6 % (ref 4.6–6.5)

## 2020-12-11 NOTE — Assessment & Plan Note (Signed)
Chronic Check a1c Low sugar / carb diet Stressed regular exercise  

## 2020-12-11 NOTE — Assessment & Plan Note (Signed)
Chronic Check lipid panel  Continue atorvastatin 80 mg daily Regular exercise and healthy diet encouraged  

## 2020-12-11 NOTE — Assessment & Plan Note (Signed)
Chronic BP well controlled Continue losartan 100 mg daily, metoprolol XL 25 mg daily, telmisartan 80 mg daily cmp

## 2020-12-11 NOTE — Assessment & Plan Note (Signed)
Chronic Using bipap nightly

## 2020-12-11 NOTE — Assessment & Plan Note (Signed)
Chronic Following with cardiology No symptoms suggestive of angina Continue aspirin 81 mg daily, Plavix 75 mg daily, atorvastatin 80 mg daily, metoprolol XL 25 mg daily He is exercising regularly Will [facility]

## 2020-12-11 NOTE — Assessment & Plan Note (Signed)
Chronic  Clinically euthyroid Currently taking levothyroxine 150 mcg daily Check tsh  Titrate med dose if needed 

## 2020-12-12 ENCOUNTER — Telehealth: Payer: Self-pay | Admitting: Internal Medicine

## 2020-12-12 ENCOUNTER — Encounter: Payer: Self-pay | Admitting: Internal Medicine

## 2020-12-12 DIAGNOSIS — R7989 Other specified abnormal findings of blood chemistry: Secondary | ICD-10-CM

## 2020-12-12 NOTE — Telephone Encounter (Signed)
Please call with blood work results-  Cholesterol is good.  Kidney function, blood counts, thyroid function are normal.  A1c is 6.0% so sugars are in the prediabetic range.  His liver tests are elevated.  They have been slightly elevated in the past, which they are much higher than they have been.  I do not think this is related to his cholesterol medication because he has been on the same dose for a while.  If he is taking any Tylenol he needs to stop it.  This may be related to his weight.  He may have what we call a fatty liver when the fat infiltrates into the liver.  This does increase his risk of liver cirrhosis so we need to know this for sure.  I would like him to have an ultrasound of his liver.  This has been ordered and someone will call him to schedule it.  We need to recheck his liver tests in 1 week-ordered for [facility]

## 2020-12-12 NOTE — Telephone Encounter (Signed)
Rx has been sent to the pharmacy electronically. ° °

## 2020-12-12 NOTE — Telephone Encounter (Signed)
Lab appointment made for patient for next Wednesday per his request.

## 2020-12-12 NOTE — Telephone Encounter (Signed)
Patient had sent my-chart response before I could call him. Sent my chart message back with info. Message read and received.  I will call and get him set up for lab recheck.  Last read by [name] at 10:05 AM on 12/12/2020.

## 2020-12-14 DIAGNOSIS — T84020D Dislocation of internal right hip prosthesis, subsequent encounter: Secondary | ICD-10-CM | POA: Diagnosis not present

## 2020-12-14 DIAGNOSIS — Z96641 Presence of right artificial hip joint: Secondary | ICD-10-CM | POA: Diagnosis not present

## 2020-12-18 DIAGNOSIS — T84020D Dislocation of internal right hip prosthesis, subsequent encounter: Secondary | ICD-10-CM | POA: Diagnosis not present

## 2020-12-19 ENCOUNTER — Other Ambulatory Visit (INDEPENDENT_AMBULATORY_CARE_PROVIDER_SITE_OTHER): Payer: Medicare Other

## 2020-12-19 DIAGNOSIS — R7989 Other specified abnormal findings of blood chemistry: Secondary | ICD-10-CM

## 2020-12-19 LAB — HEPATIC FUNCTION PANEL
ALT: 286 U/L — ABNORMAL HIGH (ref 0–53)
AST: 166 U/L — ABNORMAL HIGH (ref 0–37)
Albumin: 4.2 g/dL (ref 3.5–5.2)
Alkaline Phosphatase: 102 U/L (ref 39–117)
Bilirubin, Direct: 0.2 mg/dL (ref 0.0–0.3)
Total Bilirubin: 0.8 mg/dL (ref 0.2–1.2)
Total Protein: 7.8 g/dL (ref 6.0–8.3)

## 2020-12-19 NOTE — Addendum Note (Signed)
Addended by: Binnie Rail on: 12/19/2020 02:49 PM   Modules accepted: Orders

## 2021-01-01 ENCOUNTER — Encounter (INDEPENDENT_AMBULATORY_CARE_PROVIDER_SITE_OTHER): Payer: Self-pay | Admitting: Family Medicine

## 2021-01-01 ENCOUNTER — Other Ambulatory Visit: Payer: Medicare Other

## 2021-01-01 ENCOUNTER — Other Ambulatory Visit: Payer: Self-pay

## 2021-01-01 ENCOUNTER — Ambulatory Visit (INDEPENDENT_AMBULATORY_CARE_PROVIDER_SITE_OTHER): Payer: Medicare Other | Admitting: Family Medicine

## 2021-01-01 VITALS — BP 133/78 | HR 70 | Temp 97.7°F | Ht 71.0 in | Wt 266.0 lb

## 2021-01-01 DIAGNOSIS — R7989 Other specified abnormal findings of blood chemistry: Secondary | ICD-10-CM | POA: Diagnosis not present

## 2021-01-01 DIAGNOSIS — I25750 Atherosclerosis of native coronary artery of transplanted heart with unstable angina: Secondary | ICD-10-CM

## 2021-01-01 DIAGNOSIS — Z0289 Encounter for other administrative examinations: Secondary | ICD-10-CM

## 2021-01-01 DIAGNOSIS — Z1331 Encounter for screening for depression: Secondary | ICD-10-CM | POA: Diagnosis not present

## 2021-01-01 DIAGNOSIS — R5383 Other fatigue: Secondary | ICD-10-CM | POA: Diagnosis not present

## 2021-01-01 DIAGNOSIS — R7303 Prediabetes: Secondary | ICD-10-CM | POA: Diagnosis not present

## 2021-01-01 DIAGNOSIS — R0602 Shortness of breath: Secondary | ICD-10-CM | POA: Insufficient documentation

## 2021-01-01 DIAGNOSIS — E559 Vitamin D deficiency, unspecified: Secondary | ICD-10-CM

## 2021-01-01 DIAGNOSIS — E66812 Obesity, class 2: Secondary | ICD-10-CM

## 2021-01-01 DIAGNOSIS — Z6837 Body mass index (BMI) 37.0-37.9, adult: Secondary | ICD-10-CM

## 2021-01-01 DIAGNOSIS — I252 Old myocardial infarction: Secondary | ICD-10-CM | POA: Insufficient documentation

## 2021-01-02 LAB — COMPREHENSIVE METABOLIC PANEL
ALT: 109 IU/L — ABNORMAL HIGH (ref 0–44)
AST: 68 IU/L — ABNORMAL HIGH (ref 0–40)
Albumin/Globulin Ratio: 1.3 (ref 1.2–2.2)
Albumin: 4.3 g/dL (ref 3.8–4.8)
Alkaline Phosphatase: 102 IU/L (ref 44–121)
BUN/Creatinine Ratio: 15 (ref 10–24)
BUN: 13 mg/dL (ref 8–27)
Bilirubin Total: 0.7 mg/dL (ref 0.0–1.2)
CO2: 20 mmol/L (ref 20–29)
Calcium: 9.5 mg/dL (ref 8.6–10.2)
Chloride: 107 mmol/L — ABNORMAL HIGH (ref 96–106)
Creatinine, Ser: 0.89 mg/dL (ref 0.76–1.27)
GFR calc Af Amer: 102 mL/min/{1.73_m2} (ref >59)
GFR calc non Af Amer: 88 mL/min/{1.73_m2} (ref >59)
Globulin, Total: 3.2 g/dL (ref 1.5–4.5)
Glucose: 115 mg/dL — ABNORMAL HIGH (ref 65–99)
Potassium: 4.6 mmol/L (ref 3.5–5.2)
Sodium: 142 mmol/L (ref 134–144)
Total Protein: 7.5 g/dL (ref 6.0–8.5)

## 2021-01-02 LAB — INSULIN, RANDOM: INSULIN: 34.7 u[IU]/mL — ABNORMAL HIGH (ref 2.6–24.9)

## 2021-01-02 LAB — VITAMIN D 25 HYDROXY (VIT D DEFICIENCY, FRACTURES): Vit D, 25-Hydroxy: 32.6 ng/mL (ref 30.0–100.0)

## 2021-01-03 ENCOUNTER — Encounter: Payer: Self-pay | Admitting: Internal Medicine

## 2021-01-03 ENCOUNTER — Other Ambulatory Visit: Payer: Self-pay

## 2021-01-03 ENCOUNTER — Ambulatory Visit
Admission: RE | Admit: 2021-01-03 | Discharge: 2021-01-03 | Disposition: A | Discharge status: Home / Self Care | Payer: Medicare Other | Source: Ambulatory Visit | Attending: Internal Medicine | Admitting: Internal Medicine

## 2021-01-03 DIAGNOSIS — R945 Abnormal results of liver function studies: Secondary | ICD-10-CM | POA: Diagnosis not present

## 2021-01-03 DIAGNOSIS — R7989 Other specified abnormal findings of blood chemistry: Secondary | ICD-10-CM

## 2021-01-03 DIAGNOSIS — K760 Fatty (change of) liver, not elsewhere classified: Secondary | ICD-10-CM | POA: Insufficient documentation

## 2021-01-07 NOTE — Progress Notes (Signed)
Chief Complaint:   OBESITY TION TSE (MR# [id number]) is a 70 y.o. male who presents for evaluation and treatment of obesity and related comorbidities. Current BMI is Body mass index is 37.1 kg/m. [name] has been struggling with his weight for many years and has been unsuccessful in either losing weight, maintaining weight loss, or reaching his healthy weight goal.  [name] is a pleasant 70 year old male with a history of coronary artery disease status post STEMI and pac. He lost approximately 40 lbs with cardiac  Rehab, but he has slowly gained it back over the pandemic.  [name] is currently in the action stage of change and ready to dedicate time achieving and maintaining a healthier weight. [name] is interested in becoming our patient and working on intensive lifestyle modifications including (but not limited to) diet and exercise for weight loss.  [name]'s habits were reviewed today and are as follows: His family eats meals together, he thinks his family will eat healthier with him, his desired weight loss is 76 lbs, he has been heavy most of his life, he started gaining weight in the past 20 years, his heaviest weight ever was 275 pounds, he has significant food cravings issues, he snacks frequently in the evenings, he is frequently drinking liquids with calories, he frequently makes poor food choices, he frequently eats larger portions than normal and he struggles with emotional eating.  Depression Screen [name]'s Food and Mood (modified PHQ-9) score was 16.  Depression screen PHQ 2/9 01/01/2021  Decreased Interest 2  Down, Depressed, Hopeless 1  PHQ - 2 Score 3  Altered sleeping 3  Tired, decreased energy 3  Change in appetite 2  Feeling bad or failure about yourself  3  Trouble concentrating 0  Moving slowly or fidgety/restless 1  Suicidal thoughts 0  PHQ-9 Score 15  Difficult doing work/chores -  Some recent data might be hidden   Subjective:   1. Other  fatigue Vern admits to daytime somnolence and admits to waking up still tired. Patent has a history of symptoms of daytime fatigue. [name] generally gets 5 or 6 hours of sleep per night, and states that he has nightime awakenings. Snoring is not present. Apneic episodes are present. Epworth Sleepiness Score is 9.  2. Shortness of breath on exertion [name] notes increasing shortness of breath with exercising and seems to be worsening over time with weight gain. He notes getting out of breath sooner with activity than he used to. This has not gotten worse recently. Winfred denies shortness of breath at rest or orthopnea.  3. Pre-diabetes [name] has an elevated A1c of 6.0 last month. He denies a history of diabetes mellitus or a history of metformin use.  4. Vitamin D deficiency [name] is on Vit D, and he has no recent labs. He notes fatigue.  5. Elevated LFTs [name] has elevated LFT recently and was told it may be due in part to his Tylenol use. He was to have a hepatitis panel today ordered by Dr. Quay Burow.  6. 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 He is working on weight loss to help decrease the risk of further myocardial infarction.   Assessment/Plan:   1. Other fatigue [name] does feel that his weight is causing his energy to be lower than it should be. Fatigue may be related to obesity, depression  or many other causes. Labs will be ordered, and in the meanwhile, Keeon will focus on self care including making healthy food choices, increasing physical activity and focusing on stress reduction.  2. Shortness of breath on exertion [name] does feel that he gets out of breath more easily that he used to when he exercises. [name]'s shortness of breath appears to be obesity related and exercise induced. He has agreed to work on  weight loss and gradually increase exercise to treat his exercise induced shortness of breath. Will continue to monitor closely.  - EKG 12-Lead  3. Pre-diabetes [name] will start his diet, and will continue to work on weight loss, exercise, and decreasing simple carbohydrates to help decrease the risk of diabetes. We will check labs today.  - Insulin, random - Comprehensive metabolic panel  4. Vitamin D deficiency Low Vitamin D level contributes to fatigue and are associated with obesity, breast, and colon cancer. We will check labs today. [name] will follow-up for routine testing of Vitamin D, at least 2-3 times per year to avoid over-replacement.  - VITAMIN D 25 Hydroxy (Vit-D Deficiency, Fractures)  5. Elevated LFTs We discussed the likely diagnosis of non-alcoholic fatty liver disease today and how this condition is obesity related. [name] was educated the importance of weight loss. We will check labs today (CMP and hepatitis panel so that he doesn't have 2 blood draws today). [name] agreed to continue with his weight loss efforts with healthier diet and exercise as an essential part of his treatment plan.  - Comprehensive metabolic panel - Hepatic function panel  6. 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  7. Screening for depression [name] had a positive depression screening. Depression is commonly associated with obesity and often results in emotional eating behaviors. We will monitor this closely and work on CBT to help improve the non-hunger eating patterns. Referral to Psychology may be required if no improvement is seen as he continues in our clinic.  8. Class 2 severe obesity with serious comorbidity and body mass index (BMI) of 37.0 to 37.9 in adult, unspecified obesity type (HCC) [name] is currently in the action stage of change and his goal is  to continue with weight loss efforts. I recommend [name] begin the structured treatment plan as follows:  He has agreed to the Category 3 Plan.  Exercise goals: As is.   Behavioral modification strategies: increasing lean protein intake and meal planning and cooking strategies.  He was informed of the importance of frequent follow-up visits to maximize his success with intensive lifestyle modifications for his multiple health conditions. He was informed we would discuss his lab results at his next visit unless there is a critical issue that needs to be addressed sooner. English agreed to keep his next visit at the agreed upon time to discuss these results.  Objective:   Blood pressure 133/78, pulse 70, temperature 97.7 F (36.5 C), height 5\' 11"  (1.803 m), weight 266 lb (120.7 kg), SpO2 95 %. Body mass index is 37.1 kg/m.  EKG: Normal sinus rhythm, rate 68 BPM.  Indirect Calorimeter completed today shows a VO2 of 278 and a REE of 1930.  His calculated basal metabolic rate is 7062 thus his basal metabolic rate is worse than expected.  General: Cooperative, alert, well developed, in no acute distress. HEENT: Conjunctivae and lids unremarkable. Cardiovascular: Regular rhythm.  Lungs: Normal work of breathing.  Neurologic: No focal deficits.   Lab Results  Component Value Date   CREATININE 0.89 01/01/2021   BUN 13 01/01/2021   NA 142 01/01/2021   K 4.6 01/01/2021   CL 107 (H) 01/01/2021   CO2 20 01/01/2021   Lab Results  Component Value Date   ALT 109 (H) 01/01/2021   AST 68 (H) 01/01/2021   ALKPHOS 102 01/01/2021   BILITOT 0.7 01/01/2021   Lab Results  Component Value Date   HGBA1C 6.0 12/11/2020   HGBA1C 5.6 12/29/2019   HGBA1C 6.1 11/03/2019   HGBA1C 5.4 12/31/2018   HGBA1C 5.9 08/19/2018   Lab Results  Component Value Date   INSULIN 34.7 (H) 01/01/2021   Lab Results  Component Value Date   TSH 2.57 12/11/2020   Lab Results  Component Value Date   CHOL 112  12/11/2020   HDL 34.50 (L) 12/11/2020   LDLCALC 61 12/11/2020   LDLDIRECT 153.1 06/09/2011   TRIG 80.0 12/11/2020   CHOLHDL 3 12/11/2020   Lab Results  Component Value Date   WBC 4.4 12/11/2020   HGB 14.8 12/11/2020   HCT 43.1 12/11/2020   MCV 88.4 12/11/2020   PLT 141.0 (L) 12/11/2020   No results found for: IRON, TIBC, FERRITIN Obesity Behavioral Intervention:   Approximately 15 minutes were spent on the discussion below.  ASK: We discussed the diagnosis of obesity with [name] today and [name] agreed to give [place] permission to discuss obesity behavioral modification therapy today.  ASSESS: [name] has the diagnosis of obesity and his BMI today is 37.12. [name] is in the action stage of change.   ADVISE: [name] was educated on the multiple health risks of obesity as well as the benefit of weight loss to improve his health. He was advised of the need for long term treatment and the importance of lifestyle modifications to improve his current health and to decrease his risk of future health problems.  AGREE: Multiple dietary modification options and treatment options were discussed and Kyla agreed to follow the recommendations documented in the above note.  ARRANGE: Muzamil was educated on the importance of frequent visits to treat obesity as outlined per CMS and USPSTF guidelines and agreed to schedule his next follow up appointment today.  Attestation Statements:   Reviewed by clinician on day of visit: allergies, medications, problem list, medical history, surgical history, family history, social history, and previous encounter notes.   I, Trixie Dredge, am acting as transcriptionist for Dennard Nip, MD.  I have reviewed the above documentation for accuracy and completeness, and I agree with the above. - Dennard Nip, MD

## 2021-01-10 ENCOUNTER — Telehealth: Payer: Self-pay | Admitting: Internal Medicine

## 2021-01-10 NOTE — Telephone Encounter (Signed)
LVM for pt to rtn my call to schedule AWV with [facility]. Please schedule AWV if pt calls the office  

## 2021-01-15 ENCOUNTER — Telehealth (INDEPENDENT_AMBULATORY_CARE_PROVIDER_SITE_OTHER): Payer: Medicare Other | Admitting: Family Medicine

## 2021-01-15 ENCOUNTER — Encounter (INDEPENDENT_AMBULATORY_CARE_PROVIDER_SITE_OTHER): Payer: Self-pay | Admitting: Family Medicine

## 2021-01-15 ENCOUNTER — Other Ambulatory Visit: Payer: Self-pay

## 2021-01-15 DIAGNOSIS — R7303 Prediabetes: Secondary | ICD-10-CM | POA: Diagnosis not present

## 2021-01-15 DIAGNOSIS — E559 Vitamin D deficiency, unspecified: Secondary | ICD-10-CM | POA: Diagnosis not present

## 2021-01-15 DIAGNOSIS — K760 Fatty (change of) liver, not elsewhere classified: Secondary | ICD-10-CM

## 2021-01-15 DIAGNOSIS — Z6837 Body mass index (BMI) 37.0-37.9, adult: Secondary | ICD-10-CM

## 2021-01-15 MED ORDER — METFORMIN HCL 500 MG PO TABS: 500.0000 mg | ORAL_TABLET | Freq: Every day | ORAL | 0 refills | Status: DC

## 2021-01-15 MED ORDER — VITAMIN D (ERGOCALCIFEROL) 1.25 MG (50000 UNIT) PO CAPS: 50000.0000 [IU] | ORAL_CAPSULE | ORAL | 0 refills | Status: DC

## 2021-01-16 NOTE — Progress Notes (Signed)
TeleHealth Visit:  Due to the COVID-19 pandemic, this visit was completed with telemedicine (audio/video) technology to reduce patient and provider exposure as well as to preserve personal protective equipment.   Zaydin has verbally consented to this TeleHealth visit. The patient is located at home, the provider is located at the [facility]. The participants in this visit include the listed provider and patient. The visit was conducted today via MyChart video.   Chief Complaint: OBESITY Kiel is here to discuss his progress with his obesity treatment plan along with follow-up of his obesity related diagnoses. Wellington is on the Category 3 Plan and states he is following his eating plan approximately 0% of the time. Willaim states he is doing 0 minutes 0 times per week.  Today's visit was #: 2 Starting weight: 266 lbs Starting date: 01/01/2021  Interim History: Brandy is doing very well on his Category 3 eating plan. His weight at home is suggests he has lost approximately 11 lbs in the last 2 weeks. His hunger is mostly controlled, but he sometimes has between meal hunger which is worse in the afternoons.  Subjective:   1. Vitamin D deficiency Sarath is on OTC Vit D, but his level is not yet at goal. He notes fatigue. I discussed labs with the patient today.  2. Pre-diabetes Ayaz has been working on diet to improve pre-diabetes. He notes polyphagia is still present but decreased. He denies hypoglycemia. I discussed labs with patient today.   3. NAFLD (nonalcoholic fatty liver disease) Chaska has a history of elevated LFTs, which has been improving in the last month. His abdominal ultrasound shows fatty liver. He had been on Tylenol routing but has discontinued that. He denies abdominal pain or jaundice.   Assessment/Plan:   1. Vitamin D deficiency Low Vitamin D level contributes to fatigue and are associated with obesity, breast, and colon cancer.  Jaime agreed to start prescription Vitamin D 50,000 IU every week with no refills, and continue OTC Vit D. He will follow-up for routine testing of Vitamin D, at least 2-3 times per year to avoid over-replacement.  - Vitamin D, Ergocalciferol, (DRISDOL) 1.25 MG (50000 UNIT) CAPS capsule; Take 1 capsule (50,000 Units total) by mouth every 7 (seven) days.  Dispense: 4 capsule; Refill: 0  2. Pre-diabetes Sem agreed to start metformin 500 mg q AM with food with no refills, and continue decreasing simple carbohydrates, diet, and weight loss efforts to help decrease the risk of diabetes. We will recheck labs in 3 months.  - metFORMIN (GLUCOPHAGE) 500 MG tablet; Take 1 tablet (500 mg total) by mouth daily with breakfast.  Dispense: 30 tablet; Refill: 0  3. NAFLD (nonalcoholic fatty liver disease) We discussed the likely diagnosis of non-alcoholic fatty liver disease today and how this condition is obesity related. We discussed the diagnosis and mechanism of damage and the importance of diet and weight loss in depth. Lasean agreed to continue with his weight loss efforts with healthier diet and exercise as an essential part of his treatment plan. We will recheck labs in 3 months.  4. Class 2 severe obesity with serious comorbidity and body mass index (BMI) of 37.0 to 37.9 in adult, unspecified obesity type (HCC) Marcelles is currently in the action stage of change. As such, his goal is to continue with weight loss efforts. He has agreed to the Category 3 Plan.   Behavioral modification strategies: increasing lean protein intake, decreasing simple carbohydrates and meal planning and  cooking strategies.  Jasun has agreed to follow-up with our clinic in 2 weeks. He was informed of the importance of frequent follow-up visits to maximize his success with intensive lifestyle modifications for his multiple health conditions.  Objective:   VITALS: Per patient if applicable, see vitals. GENERAL: Alert  and in no acute distress. CARDIOPULMONARY: No increased WOB. Speaking in clear sentences.  PSYCH: Pleasant and cooperative. Speech normal rate and rhythm. Affect is appropriate. Insight and judgement are appropriate. Attention is focused, linear, and appropriate.  NEURO: Oriented as arrived to appointment on time with no prompting.   Lab Results  Component Value Date   CREATININE 0.89 01/01/2021   BUN 13 01/01/2021   NA 142 01/01/2021   K 4.6 01/01/2021   CL 107 (H) 01/01/2021   CO2 20 01/01/2021   Lab Results  Component Value Date   ALT 109 (H) 01/01/2021   AST 68 (H) 01/01/2021   ALKPHOS 102 01/01/2021   BILITOT 0.7 01/01/2021   Lab Results  Component Value Date   HGBA1C 6.0 12/11/2020   HGBA1C 5.6 12/29/2019   HGBA1C 6.1 11/03/2019   HGBA1C 5.4 12/31/2018   HGBA1C 5.9 08/19/2018   Lab Results  Component Value Date   INSULIN 34.7 (H) 01/01/2021   Lab Results  Component Value Date   TSH 2.57 12/11/2020   Lab Results  Component Value Date   CHOL 112 12/11/2020   HDL 34.50 (L) 12/11/2020   LDLCALC 61 12/11/2020   LDLDIRECT 153.1 06/09/2011   TRIG 80.0 12/11/2020   CHOLHDL 3 12/11/2020   Lab Results  Component Value Date   WBC 4.4 12/11/2020   HGB 14.8 12/11/2020   HCT 43.1 12/11/2020   MCV 88.4 12/11/2020   PLT 141.0 (L) 12/11/2020   No results found for: IRON, TIBC, FERRITIN  Attestation Statements:   Reviewed by clinician on day of visit: allergies, medications, problem list, medical history, surgical history, family history, social history, and previous encounter notes.  Time spent on visit including pre-visit chart review and post-visit charting and care was 50 minutes.    I, Trixie Dredge, am acting as transcriptionist for Dennard Nip, MD.  I have reviewed the above documentation for accuracy and completeness, and I agree with the above. - Dennard Nip, MD

## 2021-01-18 ENCOUNTER — Ambulatory Visit: Payer: Medicare Other

## 2021-01-21 ENCOUNTER — Ambulatory Visit: Payer: Medicare Other

## 2021-01-29 ENCOUNTER — Other Ambulatory Visit: Payer: Self-pay

## 2021-01-29 ENCOUNTER — Encounter (INDEPENDENT_AMBULATORY_CARE_PROVIDER_SITE_OTHER): Payer: Self-pay | Admitting: Physician Assistant

## 2021-01-29 ENCOUNTER — Ambulatory Visit (INDEPENDENT_AMBULATORY_CARE_PROVIDER_SITE_OTHER): Payer: Medicare Other

## 2021-01-29 ENCOUNTER — Ambulatory Visit (INDEPENDENT_AMBULATORY_CARE_PROVIDER_SITE_OTHER): Payer: Medicare Other | Admitting: Physician Assistant

## 2021-01-29 VITALS — BP 112/69 | HR 71 | Temp 97.5°F | Ht 71.0 in | Wt 255.0 lb

## 2021-01-29 DIAGNOSIS — Z0000 Encounter for general adult medical examination without abnormal findings: Secondary | ICD-10-CM | POA: Diagnosis not present

## 2021-01-29 DIAGNOSIS — R7303 Prediabetes: Secondary | ICD-10-CM

## 2021-01-29 DIAGNOSIS — E559 Vitamin D deficiency, unspecified: Secondary | ICD-10-CM | POA: Diagnosis not present

## 2021-01-29 DIAGNOSIS — Z6835 Body mass index (BMI) 35.0-35.9, adult: Secondary | ICD-10-CM | POA: Diagnosis not present

## 2021-01-29 MED ORDER — VITAMIN D (ERGOCALCIFEROL) 1.25 MG (50000 UNIT) PO CAPS: 50000.0000 [IU] | ORAL_CAPSULE | ORAL | 0 refills | Status: DC

## 2021-01-29 NOTE — Patient Instructions (Signed)
[name] , Thank you for taking time to come for your Medicare Wellness Visit. I appreciate your ongoing commitment to your health goals. Please review the following plan we discussed and let me know if I can assist you in the future.   Screening recommendations/referrals: Colonoscopy: 05/04/2015; due every 10 years (due 2026) Recommended yearly ophthalmology/optometry visit for glaucoma screening and checkup Recommended yearly dental visit for hygiene and checkup  Vaccinations: Influenza vaccine: 10/10/2020 Pneumococcal vaccine: 08/19/2018, 11/03/2019 (up to date) Tdap vaccine: postponed until 12/214/2022 Shingles vaccine: 01/20/2018, 03/22/2018   Covid-19: 01/18/2020, 02/08/2020, 09/11/2020  Advanced directives: Documents on file  Conditions/risks identified: Yes; Reviewed health maintenance screenings with patient today and relevant education, vaccines, and/or referrals were provided. Please continue to do your personal lifestyle choices by: daily care of teeth and gums, regular physical activity (goal should be 5 days a week for 30 minutes), eat a healthy diet, avoid tobacco and drug use, limiting any alcohol intake, taking a low-dose aspirin (if not allergic or have been advised by your provider otherwise) and taking vitamins and minerals as recommended by your provider. Continue doing brain stimulating activities (puzzles, reading, adult coloring books, staying active) to keep memory sharp. Continue to eat heart healthy diet (full of fruits, vegetables, whole grains, lean protein, water--limit salt, fat, and sugar intake) and increase physical activity as tolerated.  Next appointment: Please schedule your next Medicare Wellness Visit with your Nurse Health Advisor in 1 year by calling [phone].  Preventive Care 26 Years and Older, Male Preventive care refers to lifestyle choices and visits with your health care provider that can promote health and wellness. What does preventive care  include?  A yearly physical exam. This is also called an annual well check.  Dental exams once or twice a year.  Routine eye exams. Ask your health care provider how often you should have your eyes checked.  Personal lifestyle choices, including:  Daily care of your teeth and gums.  Regular physical activity.  Eating a healthy diet.  Avoiding tobacco and drug use.  Limiting alcohol use.  Practicing safe sex.  Taking low doses of aspirin every day.  Taking vitamin and mineral supplements as recommended by your health care provider. What happens during an annual well check? The services and screenings done by your health care provider during your annual well check will depend on your age, overall health, lifestyle risk factors, and family history of disease. Counseling  Your health care provider may ask you questions about your:  Alcohol use.  Tobacco use.  Drug use.  Emotional well-being.  Home and relationship well-being.  Sexual activity.  Eating habits.  History of falls.  Memory and ability to understand (cognition).  Work and work Statistician. Screening  You may have the following tests or measurements:  Height, weight, and BMI.  Blood pressure.  Lipid and cholesterol levels. These may be checked every 5 years, or more frequently if you are over 50 years old.  Skin check.  Lung cancer screening. You may have this screening every year starting at age 76 if you have a 30-pack-year history of smoking and currently smoke or have quit within the past 15 years.  Fecal occult blood test (FOBT) of the stool. You may have this test every year starting at age 9.  Flexible sigmoidoscopy or colonoscopy. You may have a sigmoidoscopy every 5 years or a colonoscopy every 10 years starting at age 45.  Prostate cancer screening. Recommendations will vary depending on your family  history and other risks.  Hepatitis C blood test.  Hepatitis B blood  test.  Sexually transmitted disease (STD) testing.  Diabetes screening. This is done by checking your blood sugar (glucose) after you have not eaten for a while (fasting). You may have this done every 1-3 years.  Abdominal aortic aneurysm (AAA) screening. You may need this if you are a current or former smoker.  Osteoporosis. You may be screened starting at age 6 if you are at high risk. Talk with your health care provider about your test results, treatment options, and if necessary, the need for more tests. Vaccines  Your health care provider may recommend certain vaccines, such as:  Influenza vaccine. This is recommended every year.  Tetanus, diphtheria, and acellular pertussis (Tdap, Td) vaccine. You may need a Td booster every 10 years.  Zoster vaccine. You may need this after age 76.  Pneumococcal 13-valent conjugate (PCV13) vaccine. One dose is recommended after age 53.  Pneumococcal polysaccharide (PPSV23) vaccine. One dose is recommended after age 7. Talk to your health care provider about which screenings and vaccines you need and how often you need them. This information is not intended to replace advice given to you by your health care provider. Make sure you discuss any questions you have with your health care provider. Document Released: 01/11/2016 Document Revised: 09/03/2016 Document Reviewed: 10/16/2015 Elsevier Interactive Patient Education  2017 [place] Prevention in the Home Falls can cause injuries. They can happen to people of all ages. There are many things you can do to make your home safe and to help prevent falls. What can I do on the outside of my home?  Regularly fix the edges of walkways and driveways and fix any cracks.  Remove anything that might make you trip as you walk through a door, such as a raised step or threshold.  Trim any bushes or trees on the path to your home.  Use bright outdoor lighting.  Clear any walking paths of  anything that might make someone trip, such as rocks or tools.  Regularly check to see if handrails are loose or broken. Make sure that both sides of any steps have handrails.  Any raised decks and porches should have guardrails on the edges.  Have any leaves, snow, or ice cleared regularly.  Use sand or salt on walking paths during winter.  Clean up any spills in your garage right away. This includes oil or grease spills. What can I do in the bathroom?  Use night lights.  Install grab bars by the toilet and in the tub and shower. Do not use towel bars as grab bars.  Use non-skid mats or decals in the tub or shower.  If you need to sit down in the shower, use a plastic, non-slip stool.  Keep the floor dry. Clean up any water that spills on the floor as soon as it happens.  Remove soap buildup in the tub or shower regularly.  Attach bath mats securely with double-sided non-slip rug tape.  Do not have throw rugs and other things on the floor that can make you trip. What can I do in the bedroom?  Use night lights.  Make sure that you have a light by your bed that is easy to reach.  Do not use any sheets or blankets that are too big for your bed. They should not hang down onto the floor.  Have a firm chair that has side arms. You can use  this for support while you get dressed.  Do not have throw rugs and other things on the floor that can make you trip. What can I do in the kitchen?  Clean up any spills right away.  Avoid walking on wet floors.  Keep items that you use a lot in easy-to-reach places.  If you need to reach something above you, use a strong step stool that has a grab bar.  Keep electrical cords out of the way.  Do not use floor polish or wax that makes floors slippery. If you must use wax, use non-skid floor wax.  Do not have throw rugs and other things on the floor that can make you trip. What can I do with my stairs?  Do not leave any items on the  stairs.  Make sure that there are handrails on both sides of the stairs and use them. Fix handrails that are broken or loose. Make sure that handrails are as long as the stairways.  Check any carpeting to make sure that it is firmly attached to the stairs. Fix any carpet that is loose or worn.  Avoid having throw rugs at the top or bottom of the stairs. If you do have throw rugs, attach them to the floor with carpet tape.  Make sure that you have a light switch at the top of the stairs and the bottom of the stairs. If you do not have them, ask someone to add them for you. What else can I do to help prevent falls?  Wear shoes that:  Do not have high heels.  Have rubber bottoms.  Are comfortable and fit you well.  Are closed at the toe. Do not wear sandals.  If you use a stepladder:  Make sure that it is fully opened. Do not climb a closed stepladder.  Make sure that both sides of the stepladder are locked into place.  Ask someone to hold it for you, if possible.  Clearly mark and make sure that you can see:  Any grab bars or handrails.  First and last steps.  Where the edge of each step is.  Use tools that help you move around (mobility aids) if they are needed. These include:  Canes.  Walkers.  Scooters.  Crutches.  Turn on the lights when you go into a dark area. Replace any light bulbs as soon as they burn out.  Set up your furniture so you have a clear path. Avoid moving your furniture around.  If any of your floors are uneven, fix them.  If there are any pets around you, be aware of where they are.  Review your medicines with your doctor. Some medicines can make you feel dizzy. This can increase your chance of falling. Ask your doctor what other things that you can do to help prevent falls. This information is not intended to replace advice given to you by your health care provider. Make sure you discuss any questions you have with your health care  provider. Document Released: 10/11/2009 Document Revised: 05/22/2016 Document Reviewed: 01/19/2015 Elsevier Interactive Patient Education  2017 [employer].

## 2021-01-29 NOTE — Progress Notes (Signed)
Subjective:   [name] is a 70 y.o. male who presents for an Initial Medicare Annual Wellness Visit.  Review of Systems    No ROS. Medicare Wellness Visit. Additional risk factors are reflected in social history. Cardiac Risk Factors include: advanced age (>34men, >65 women);dyslipidemia;family history of premature cardiovascular disease;hypertension;male gender;obesity (BMI >30kg/m2)     Objective:    Today's Vitals   01/29/21 1314  BP: 112/69  Pulse: 71  Temp: (!) 97.5 F (36.4 C)  SpO2: 98%  Weight: 255 lb (115.7 kg)  Height: 5\' 11"  (1.803 m)  PainSc: 0-No pain   Body mass index is 35.57 kg/m.  Advanced Directives 01/29/2021 11/26/2020 01/04/2020 12/27/2019 02/20/2019 01/27/2019 12/30/2018  Does Patient Have a Medical Advance Directive? Yes No Yes Yes No No No  Type of Advance Directive - - Healthcare Power of [place];Living will Healthcare Power of [place];Living will - - -  Does patient want to make changes to medical advance directive? No - Patient declined - No - Patient declined No - Patient declined - - -  Copy of Healthcare Power of Attorney in Chart? - - Yes - validated most recent copy scanned in chart (See row information) No - copy requested - - -  Would patient like information on creating a medical advance directive? - No - Patient declined - - No - Patient declined Yes (MAU/Ambulatory/Procedural Areas - Information given) Yes (Inpatient - patient requests chaplain consult to create a medical advance directive)  Pre-existing out of facility DNR order (yellow form or pink MOST form) - - - - - - -    Current Medications (verified) Outpatient Encounter Medications as of 01/29/2021  Medication Sig  . aspirin EC 81 MG tablet Take 81 mg by mouth daily.  Marland Kitchen atorvastatin (LIPITOR) 80 MG tablet TAKE 1 TABLET (80 MG TOTAL) BY MOUTH DAILY AT 6 PM.  . cholecalciferol (VITAMIN D3) 25 MCG (1000 UNIT) tablet Take 1,000 Units by mouth daily.  . clopidogrel (PLAVIX) 75 MG  tablet Take 1 tablet (75 mg total) by mouth daily.  . diclofenac Sodium (VOLTAREN) 1 % GEL Apply 2 g topically 4 (four) times daily as needed (pain).  Marland Kitchen levothyroxine (SYNTHROID) 150 MCG tablet TAKE 1 TABLET BY MOUTH EVERY DAY BEFORE BREAKFAST  . metFORMIN (GLUCOPHAGE) 500 MG tablet Take 1 tablet (500 mg total) by mouth daily with breakfast.  . metoprolol succinate (TOPROL-XL) 25 MG 24 hr tablet Take 1 tablet (25 mg total) by mouth daily.  . Multiple Vitamins-Minerals (MULTIVITAMIN) tablet Take 1 tablet by mouth daily.  . nitroGLYCERIN (NITROSTAT) 0.4 MG SL tablet PLACE 1 TABLET (0.4 MG TOTAL) UNDER THE TONGUE EVERY 5 (FIVE) MINUTES AS NEEDED FOR CHEST PAIN.  Marland Kitchen polyvinyl alcohol (LIQUIFILM TEARS) 1.4 % ophthalmic solution Place 1 drop into both eyes as needed for dry eyes.  . tamsulosin (FLOMAX) 0.4 MG CAPS capsule Take 0.4 mg by mouth daily.  Marland Kitchen telmisartan (MICARDIS) 80 MG tablet Take 1 tablet (80 mg total) by mouth daily.  . Vitamin D, Ergocalciferol, (DRISDOL) 1.25 MG (50000 UNIT) CAPS capsule Take 1 capsule (50,000 Units total) by mouth every 7 (seven) days.   No facility-administered encounter medications on file as of 01/29/2021.    Allergies (verified) Patient has no known allergies.   History: Past Medical History:  Diagnosis Date  . Back pain   . BPH (benign prostatic hypertrophy)   . Chest pain   . COLONIC POLYPS, HX OF 2007   clear colo w/o  polyps 04/2015: 48yr follow up  . Constipation   . Coronary artery disease   . Decreased mobility    due to BL knee and hip replacement  . Fatigue   . GERD (gastroesophageal reflux disease)   . Heartburn   . History of kidney stones    passed  . Hyperlipidemia   . HYPERTENSION   . HYPOTHYROIDISM   . Joint pain   . Myocardial infarction ([place]) 12/2018   Inferior STEMI  . OA (osteoarthritis)    Knees  . OBESITY   . OSA treated with BiPAP   . PAF (paroxysmal atrial fibrillation) ([place])   . Pre-diabetes   . Shortness of breath on  exertion   . Umbilical hernia   . Ventral hernia    Past Surgical History:  Procedure Laterality Date  . arthroscopic knee surgery     (R) 2003 & (L) 2010  . CARDIAC CATHETERIZATION    . COLONOSCOPY  04/2015   no polyps ([place])  . CORONARY/GRAFT ACUTE MI REVASCULARIZATION N/A 12/30/2018   Procedure: Coronary/Graft Acute MI Revascularization;  Surgeon: Belva Crome, MD;  Location: [facility];  Service: Cardiovascular;  Laterality: N/A;  . LAMINECTOMY  1991  . LEFT HEART CATH AND CORONARY ANGIOGRAPHY N/A 12/30/2018   Procedure: LEFT HEART CATH AND CORONARY ANGIOGRAPHY;  Surgeon: Belva Crome, MD;  Location: [facility];  Service: Cardiovascular;  Laterality: N/A;  . TONSILLECTOMY  1990   w/ adenoids and uvula  . TOTAL HIP ARTHROPLASTY Right 01/2011   [place]  . TOTAL HIP ARTHROPLASTY Left 04/16/2016   Procedure: TOTAL HIP ARTHROPLASTY ANTERIOR APPROACH;  Surgeon: Gaynelle Arabian, MD;  Location: [facility];  Service: Orthopedics;  Laterality: Left;  . TOTAL HIP REVISION Right 01/04/2020   Procedure: Right hip bearing surface;  Surgeon: Gaynelle Arabian, MD;  Location: [facility];  Service: Orthopedics;  Laterality: Right;  169min  . TOTAL KNEE ARTHROPLASTY  12/27/2012   Procedure: TOTAL KNEE BILATERAL;  Surgeon: Gearlean Alf, MD;  Location: [facility];  Service: Orthopedics;  Laterality: Bilateral;   Family History  Problem Relation Age of Onset  . Heart disease Father 7       CABG age 68  . Hypertension Father   . Diabetes Father   . Hyperlipidemia Father   . Cancer Father   . Dementia Mother        ?ALS vs SNP  . ALS Maternal Grandfather   . Arthritis Other        Parent & grandparents  . Colon cancer Neg Hx    Social History   Socioeconomic History  . Marital status: Married    Spouse name: Render Matheus  . Number of children: Not on file  . Years of education: Not on file  . Highest education level: Not on file  Occupational History  . Occupation: Retired  Tobacco Use   . Smoking status: Never Smoker  . Smokeless tobacco: Never Used  Vaping Use  . Vaping Use: Never used  Substance and Sexual Activity  . Alcohol use: Yes    Alcohol/week: 0.0 standard drinks    Comment: rarely, social  . Drug use: No  . Sexual activity: Not on file  Other Topics Concern  . Not on file  Social History Narrative   Working part time, contemplating retirement end of 2016   Lives at home with spouse      Exercise: water exercises   Social Determinants of Health   Financial  Resource Strain: Low Risk   . Difficulty of Paying Living Expenses: Not hard at all  Food Insecurity: No Food Insecurity  . Worried About Charity fundraiser in the Last Year: Never true  . Ran Out of Food in the Last Year: Never true  Transportation Needs: No Transportation Needs  . Lack of Transportation (Medical): No  . Lack of Transportation (Non-Medical): No  Physical Activity: Sufficiently Active  . Days of Exercise per Week: 5 days  . Minutes of Exercise per Session: 60 min  Stress: No Stress Concern Present  . Feeling of Stress : Not at all  Social Connections: Socially Integrated  . Frequency of Communication with Friends and Family: More than three times a week  . Frequency of Social Gatherings with Friends and Family: More than three times a week  . Attends Religious Services: More than 4 times per year  . Active Member of Clubs or Organizations: Yes  . Attends Archivist Meetings: More than 4 times per year  . Marital Status: Married    Tobacco Counseling Counseling given: Not Answered   Clinical Intake:  Pre-visit preparation completed: Yes  Pain : No/denies pain Pain Score: 0-No pain     BMI - recorded: 35.57 Nutritional Status: BMI > 30  Obese Nutritional Risks: None Diabetes: No  How often do you need to have someone help you when you read instructions, pamphlets, or other written materials from your doctor or pharmacy?: 1 - Never What is the last  grade level you completed in school?: 2 years of college  Diabetic? no  Interpreter Needed?: No  Information entered by :: Lisette Abu, LPN   Activities of Daily Living In your present state of health, do you have any difficulty performing the following activities: 01/29/2021  Hearing? N  Vision? N  Difficulty concentrating or making decisions? N  Walking or climbing stairs? N  Dressing or bathing? N  Preparing Food and eating ? N  Using the Toilet? N  In the past six months, have you accidently leaked urine? N  Do you have problems with loss of bowel control? N  Managing your Medications? N  Managing your Finances? N  Housekeeping or managing your Housekeeping? N  Some recent data might be hidden    Patient Care Team: Binnie Rail, MD as PCP - General (Internal Medicine) Sueanne Margarita, MD as PCP - Cardiology (Cardiology) Gaynelle Arabian, MD as Consulting Physician (Orthopedic Surgery) Carolan Clines, MD (Inactive) (Urology) [place], Lajuan Lines, MD as Consulting Physician (Gastroenterology) Charlton Haws, [facility] as Pharmacist (Pharmacist)  Indicate any recent Medical Services you may have received from other than [facility] providers in the past year (date may be approximate).     Assessment:   This is a routine wellness examination for [facility].  Hearing/Vision screen No exam data present  Dietary issues and exercise activities discussed: Current Exercise Habits: Structured exercise class, Type of exercise: Other - see comments (swimming 4-5 days per week for 45-60 minutes; Also attends [facility] Time (Minutes): 60, Frequency (Times/Week): 5, Weekly Exercise (Minutes/Week): 300, Intensity: Moderate, Exercise limited by: cardiac condition(s);orthopedic condition(s);respiratory conditions(s)  Goals    . Patient Stated     To maintain my current health status by continuing to eat healthy, stay physically and socially. Also I plan to continue  swimming 4-5 days out of the week for 45 minutes-1 hours.    . Pharmacy Care Plan     CARE PLAN ENTRY (see  longitudinal plan of care for additional care plan information)  Current Barriers:  . Chronic Disease Management support, education, and care coordination needs related to Hypertension, Hyperlipidemia, Coronary Artery Disease, and Osteoarthritis   Hypertension BP Readings from Last 3 Encounters:  10/16/20 (!) 154/86  04/16/20 138/86  01/04/20 (!) 145/80 .  Pharmacist Clinical Goal(s): o Over the next 180 days, patient will work with PharmD and providers to achieve BP goal <140/90 . Current regimen:  o Losartan 100 mg daily o Metoprolol succinate 25 mg daily . Interventions: o Discussed BP goals and benefits of medications for prevention of heart attack / stroke o Recommend switching losartan to telmisartan 80 mg for improved BP control  . Patient self care activities - Over the next 180 days, patient will: o Check BP daily, document, and provide at future appointments o Ensure daily salt intake < 2300 mg/day  Hyperlipidemia / CAD Lab Results  Component Value Date/Time   LDLCALC 47 04/16/2020 08:18 AM   LDLDIRECT 153.1 06/09/2011 09:42 AM .  Pharmacist Clinical Goal(s): o Over the next 180 days, patient will work with PharmD and providers to maintain LDL goal < 70 . Current regimen:  o Atorvastatin 80 mg daily o Nitroglycerin 0.4 mg SL as needed o Aspirin 81 mg daily o Clopidogrel 75 mg daily . Interventions: o Discussed cholesterol goals and benefits of medications for prevention of heart attack / stroke o Discussed bleeding risks associated with clopidogrel + aspirin and when to seek medical attention . Patient self care activities - Over the next 180 days, patient will: o Continue current medications o Avoid NSAIDs o Seek medical attention if bleeding in urine or stool, or for head trauma   Osteoarthritis . Pharmacist Clinical Goal(s) o Over the next 180 days,  patient will work with PharmD and providers to optimize therapy . Current regimen:  o Tylenol Arthritis 650 mg as needed . Interventions: o Discussed optimal dosing of Tylenol for pain management o Discussed benefits of Voltaren Gel  . Patient self care activities - Over the next 180 days, patient will: o Take Tylenol daily in AM for prophylaxis o Use Voltaren gel as needed  Medication management . Pharmacist Clinical Goal(s): o Over the next 180 days, patient will work with PharmD and providers to maintain optimal medication adherence . Current pharmacy: [facility] . Interventions o Comprehensive medication review performed. o Continue current medication management strategy . Patient self care activities - Over the next 180 days, patient will: o Focus on medication adherence by pill box o Take medications as prescribed o Report any questions or concerns to PharmD and/or provider(s)  Initial goal documentation      Depression Screen PHQ 2/9 Scores 01/29/2021 01/01/2021 12/11/2020 08/03/2019 07/18/2019 01/24/2019 08/19/2018  PHQ - 2 Score 0 3 0 0 0 0 0  PHQ- 9 Score - 15 - - - - -    Fall Risk Fall Risk  01/29/2021 12/11/2020 01/20/2019 08/19/2018 03/30/2014  Falls in the past year? 0 0 0 No No  Number falls in past yr: 0 0 0 - -  Injury with Fall? 0 0 0 - -  Risk for fall due to : No Fall Risks No Fall Risks Other (Comment) - -  Risk for fall due to: Comment - - Single Leg Stand  - -  Follow up Falls evaluation completed Falls evaluation completed Falls evaluation completed - -    FALL RISK PREVENTION PERTAINING TO THE HOME:  Any stairs in or around the  home? No  If so, are there any without handrails? No  Home free of loose throw rugs in walkways, pet beds, electrical cords, etc? Yes  Adequate lighting in your home to reduce risk of falls? Yes   ASSISTIVE DEVICES UTILIZED TO PREVENT FALLS:  Life alert? No  Use of a cane, walker or w/c? No  Grab bars in the bathroom? Yes  Shower chair  or bench in shower? Yes  Elevated toilet seat or a handicapped toilet? Yes   TIMED UP AND GO:  Was the test performed? No .  Length of time to ambulate 10 feet: 0 sec.   Gait steady and fast without use of assistive device  Cognitive Function: Normal cognitive status assessed by direct observation by this Nurse Health Advisor. No abnormalities found.          Immunizations Immunization History  Administered Date(s) Administered  . Fluad Quad(high Dose 65+) 09/30/2019, 10/10/2020  . Influenza,inj,Quad PF,6+ Mos 10/15/2017  . Influenza,inj,quad, With Preservative 09/28/2018  . Influenza-Unspecified 09/28/2012, 09/28/2013, 09/28/2017  . PFIZER(Purple Top)SARS-COV-2 Vaccination 01/18/2020, 02/08/2020, 09/11/2020  . Pneumococcal Conjugate-13 08/19/2018  . Pneumococcal Polysaccharide-23 11/03/2019  . Td 09/04/2009  . Zoster 08/12/2013  . Zoster Recombinat (Shingrix) 01/20/2018, 03/22/2018    TDAP status: Due, Education has been provided regarding the importance of this vaccine. Advised may receive this vaccine at local pharmacy or Health Dept. Aware to provide a copy of the vaccination record if obtained from local pharmacy or Health Dept. Verbalized acceptance and understanding.  Flu Vaccine status: Up to date  Pneumococcal vaccine status: Up to date  Covid-19 vaccine status: Completed vaccines  Qualifies for Shingles Vaccine? Yes   Zostavax completed Yes   Shingrix Completed?: Yes  Screening Tests Health Maintenance  Topic Date Due  . TETANUS/TDAP  12/11/2021 (Originally 09/05/2019)  . COLONOSCOPY (Pts 45-81yrs Insurance coverage will need to be confirmed)  04/29/2025  . INFLUENZA VACCINE  Completed  . COVID-19 Vaccine  Completed  . Hepatitis C Screening  Completed  . PNA vac Low Risk Adult  Completed    Health Maintenance  There are no preventive care reminders to display for this patient.  Colorectal cancer screening: Type of screening: Colonoscopy. Completed  04/30/2015. Repeat every 10 years  Lung Cancer Screening: (Low Dose CT Chest recommended if Age 51-80 years, 30 pack-year currently smoking OR have quit w/in 15years.) does not qualify.   Lung Cancer Screening Referral: no  Additional Screening:  Hepatitis C Screening: does qualify; Completed yes  Vision Screening: Recommended annual ophthalmology exams for early detection of glaucoma and other disorders of the eye. Is the patient up to date with their annual eye exam?  Yes  Who is the provider or what is the name of the office in which the patient attends annual eye exams? Melissa Noon, MD. If pt is not established with a provider, would they like to be referred to a provider to establish care? No .   Dental Screening: Recommended annual dental exams for proper oral hygiene  Community Resource Referral / Chronic Care Management: CRR required this visit?  No   CCM required this visit?  No      Plan:     I have personally reviewed and noted the following in the patient's chart:   . Medical and social history . Use of alcohol, tobacco or illicit drugs  . Current medications and supplements . Functional ability and status . Nutritional status . Physical activity . Advanced directives . List of other  physicians . Hospitalizations, surgeries, and ER visits in previous 12 months . Vitals . Screenings to include cognitive, depression, and falls . Referrals and appointments  In addition, I have reviewed and discussed with patient certain preventive protocols, quality metrics, and best practice recommendations. A written personalized care plan for preventive services as well as general preventive health recommendations were provided to patient.     Sheral Flow, LPN   [id number]   Nurse Notes: n/a

## 2021-01-30 NOTE — Progress Notes (Signed)
Chief Complaint:   OBESITY [name] is here to discuss his progress with his obesity treatment plan along with follow-up of his obesity related diagnoses. [name] is on the Category 3 Plan and states he is following his eating plan approximately 90-95% of the time. [name] states he is swimming for 60 minutes 7 times per week.  Today's visit was #: 3 Starting weight: 266 lbs Starting date: 01/01/2021 Today's weight: 255 lbs Today's date: 01/29/2021 Total lbs lost to date: 11 Total lbs lost since last in-office visit: 11  Interim History: [name] did very well with weight loss. He is slightly bored with the plan, especially breakfast and dinner. He is hungry between lunch and dinner. He weighs his protein often but not daily.  Subjective:   1. Vitamin D deficiency [name] is on Vit D weekly, and he is tolerating it well.  2. Pre-diabetes [name] is on metformin, and he is taking it with breakfast. He endorses hunger between lunch and dinner.  Assessment/Plan:   1. Vitamin D deficiency Low Vitamin D level contributes to fatigue and are associated with obesity, breast, and colon cancer. We will refill prescription Vitamin D for 1 month. [name] will follow-up for routine testing of Vitamin D, at least 2-3 times per year to avoid over-replacement.  - Vitamin D, Ergocalciferol, (DRISDOL) 1.25 MG (50000 UNIT) CAPS capsule; Take 1 capsule (50,000 Units total) by mouth every 7 (seven) days.  Dispense: 4 capsule; Refill: 0  2. Pre-diabetes [name] will continue metformin, and will take with lunch and will continue to work on weight loss, exercise, and decreasing simple carbohydrates to help decrease the risk of diabetes.   3. Class 2 severe obesity with serious comorbidity and body mass index (BMI) of 35.0 to 35.9 in adult, unspecified obesity type (HCC) [name] is currently in the action stage of change. As such, his goal is to continue with weight loss efforts. He has agreed to the  Category 3 Plan and keeping a food journal and adhering to recommended goals of 450-600 calories and 40 grams of protein at supper daily.   Breakfast options were given today.  Exercise goals: As is.  Behavioral modification strategies: meal planning and cooking strategies and planning for success.  [name] has agreed to follow-up with our clinic in 2 weeks. He was informed of the importance of frequent follow-up visits to maximize his success with intensive lifestyle modifications for his multiple health conditions.   Objective:   Blood pressure 112/69, pulse 71, temperature (!) 97.5 F (36.4 C), height 5\' 11"  (1.803 m), weight 255 lb (115.7 kg), SpO2 98 %. Body mass index is 35.57 kg/m.  General: Cooperative, alert, well developed, in no acute distress. HEENT: Conjunctivae and lids unremarkable. Cardiovascular: Regular rhythm.  Lungs: Normal work of breathing. Neurologic: No focal deficits.   Lab Results  Component Value Date   CREATININE 0.89 01/01/2021   BUN 13 01/01/2021   NA 142 01/01/2021   K 4.6 01/01/2021   CL 107 (H) 01/01/2021   CO2 20 01/01/2021   Lab Results  Component Value Date   ALT 109 (H) 01/01/2021   AST 68 (H) 01/01/2021   ALKPHOS 102 01/01/2021   BILITOT 0.7 01/01/2021   Lab Results  Component Value Date   HGBA1C 6.0 12/11/2020   HGBA1C 5.6 12/29/2019   HGBA1C 6.1 11/03/2019   HGBA1C 5.4 12/31/2018   HGBA1C 5.9 08/19/2018   Lab Results  Component Value Date   INSULIN 34.7 (H) 01/01/2021  Lab Results  Component Value Date   TSH 2.57 12/11/2020   Lab Results  Component Value Date   CHOL 112 12/11/2020   HDL 34.50 (L) 12/11/2020   LDLCALC 61 12/11/2020   LDLDIRECT 153.1 06/09/2011   TRIG 80.0 12/11/2020   CHOLHDL 3 12/11/2020   Lab Results  Component Value Date   WBC 4.4 12/11/2020   HGB 14.8 12/11/2020   HCT 43.1 12/11/2020   MCV 88.4 12/11/2020   PLT 141.0 (L) 12/11/2020   No results found for: IRON, TIBC,  FERRITIN  Obesity Behavioral Intervention:   Approximately 15 minutes were spent on the discussion below.  ASK: We discussed the diagnosis of obesity with [name] today and [name] agreed to give [place] permission to discuss obesity behavioral modification therapy today.  ASSESS: [name] has the diagnosis of obesity and his BMI today is 35.58. [name] is in the action stage of change.   ADVISE: [name] was educated on the multiple health risks of obesity as well as the benefit of weight loss to improve his health. He was advised of the need for long term treatment and the importance of lifestyle modifications to improve his current health and to decrease his risk of future health problems.  AGREE: Multiple dietary modification options and treatment options were discussed and [name] agreed to follow the recommendations documented in the above note.  ARRANGE: [name] was educated on the importance of frequent visits to treat obesity as outlined per CMS and USPSTF guidelines and agreed to schedule his next follow up appointment today.  Attestation Statements:   Reviewed by clinician on day of visit: allergies, medications, problem list, medical history, surgical history, family history, social history, and previous encounter notes.   Wilhemena Durie, am acting as transcriptionist for [employer], PA-C.  I have reviewed the above documentation for accuracy and completeness, and I agree with the above. Abby Potash, PA-C

## 2021-02-03 IMAGING — MR MR HIP*R* W/O CM
5 of 6 series · 33 of 40 positions shown · non-contrast
Comparison: CT scan 08/21/2015 and MRI 04/10/2014

CLINICAL DATA: Right hip pain for 12 months. History of right total
hip arthroplasty in 1511.

EXAM:
MR OF THE RIGHT HIP WITHOUT CONTRAST
TECHNIQUE: Multiplanar, multisequence MR imaging was performed. No intravenous
contrast was administered.

[Series 4: T1 · axial · 4.0mm · 1.56mm/px · z∈[-136,+169]mm · 9 of 62 slices shown (1 of 2)]
[im 1/62]
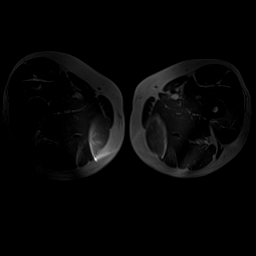
[im 8/62]
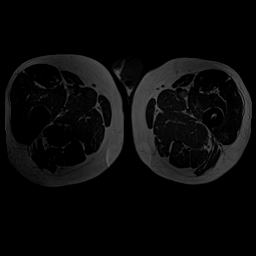
[im 16/62]
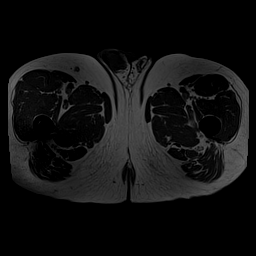
[im 23/62]
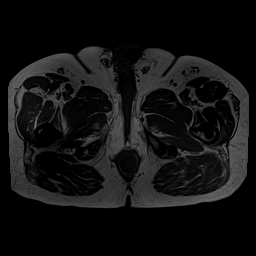
[im 31/62]
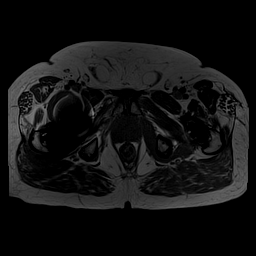
[im 39/62]
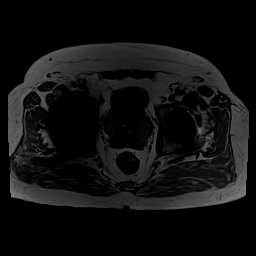
[im 46/62]
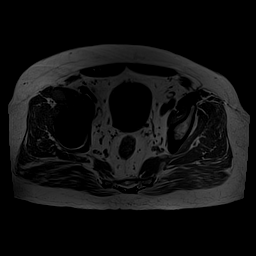
[im 54/62]
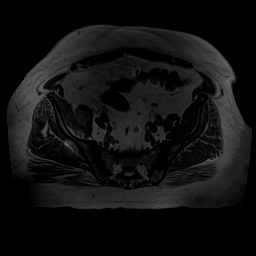
[im 62/62]
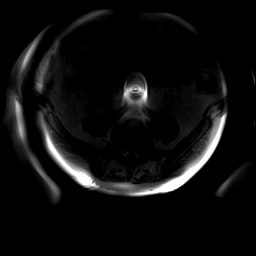

[Series 5: T2 · axial · 4.0mm · 0.78mm/px · z∈[-136,+169]mm · 9 of 62 slices shown]
[im 1/62]
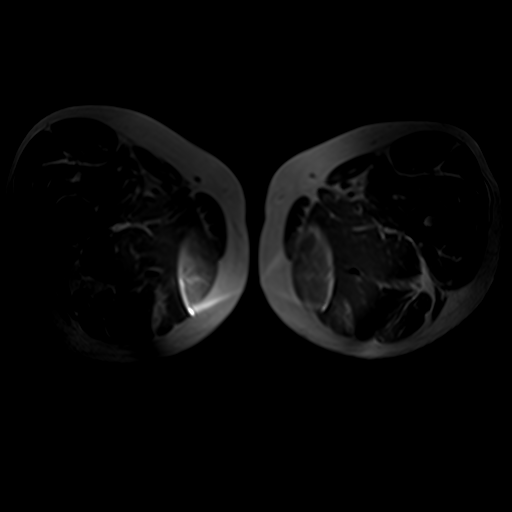
[im 8/62]
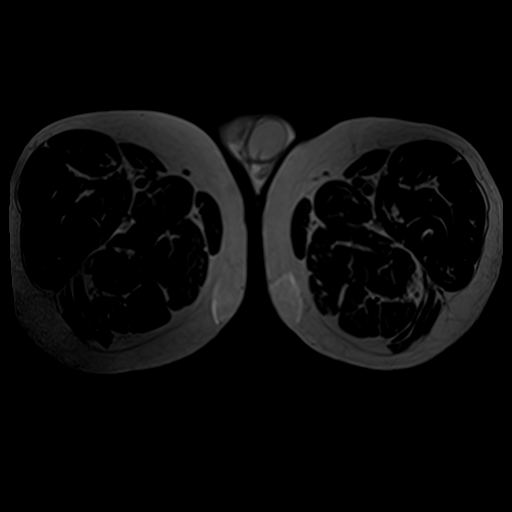
[im 16/62]
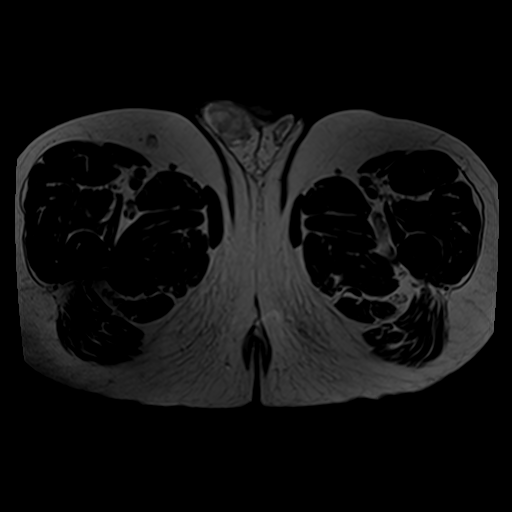
[im 23/62]
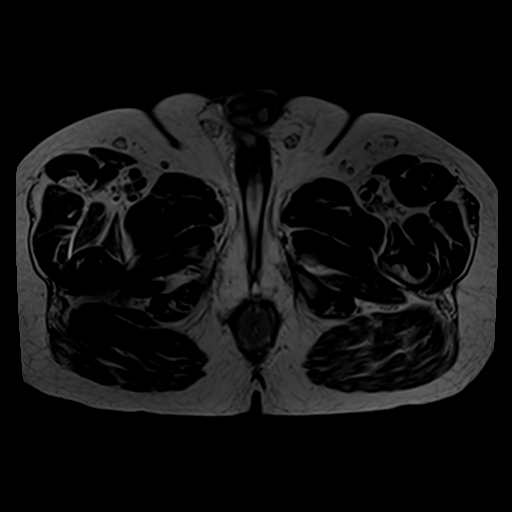
[im 31/62]
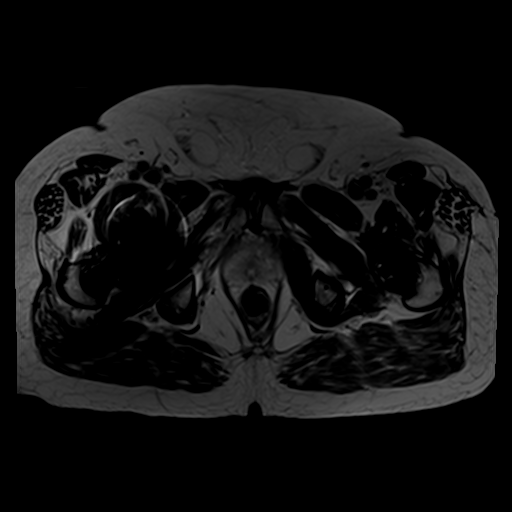
[im 39/62]
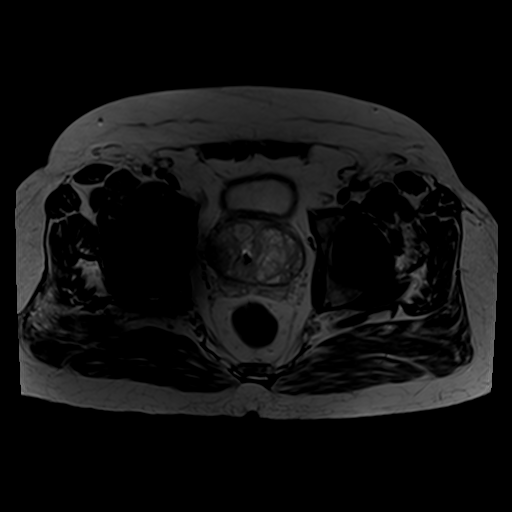
[im 46/62]
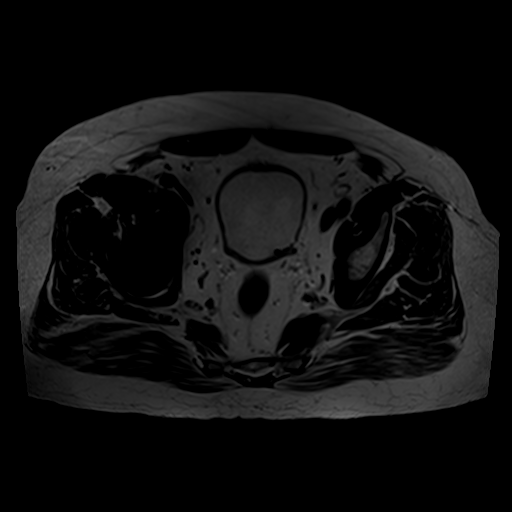
[im 54/62]
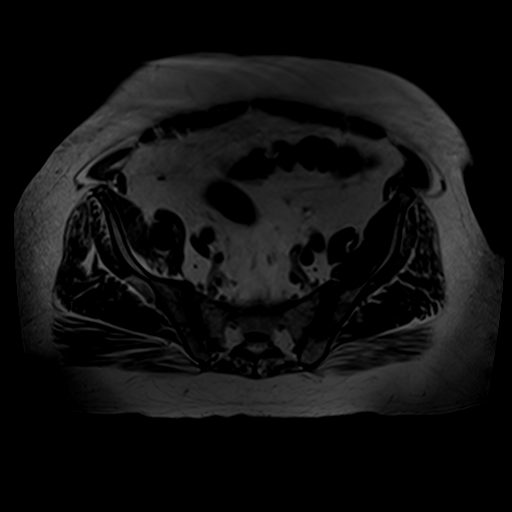
[im 62/62]
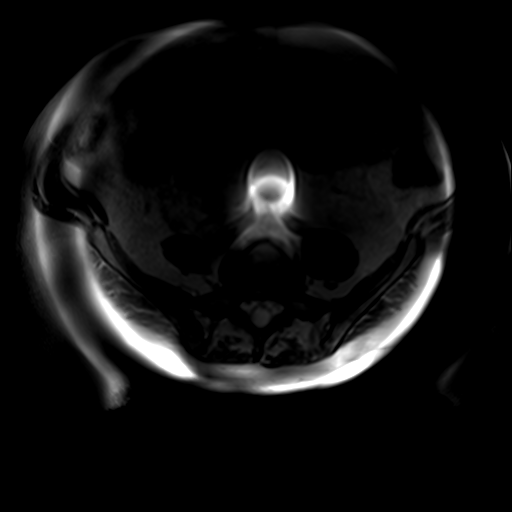

[Series 6: STIR · axial · 4.0mm · 1.56mm/px · z∈[-136,+169]mm · 8 of 62 slices shown (1 of 2)]
[im 1/62]
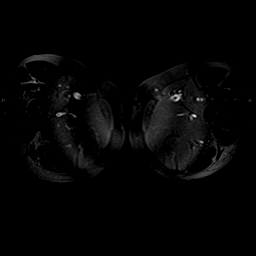
[im 7/62]
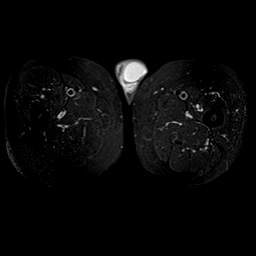
[im 21/62]
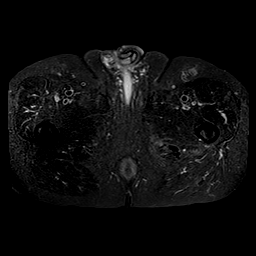
[im 28/62]
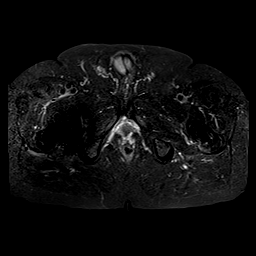
[im 34/62]
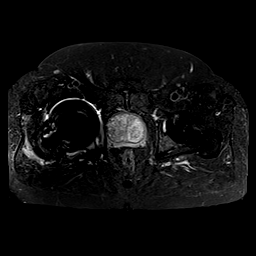
[im 41/62]
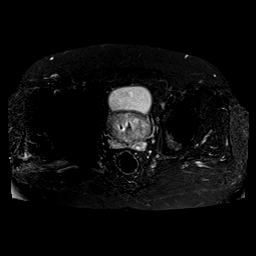
[im 55/62]
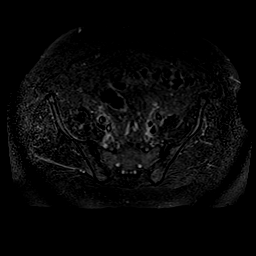
[im 62/62]
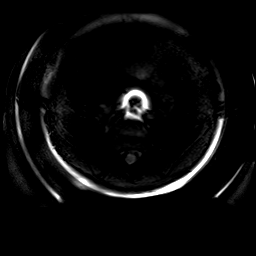

[Series 7: T1 · coronal · 4.0mm · 1.25mm/px · 4 of 24 slices shown (2 of 2)]
[im 1/24]
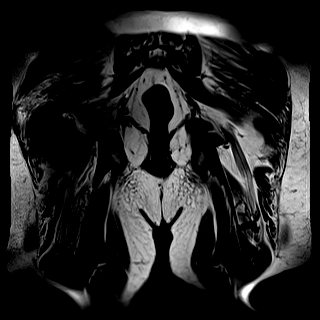
[im 8/24]
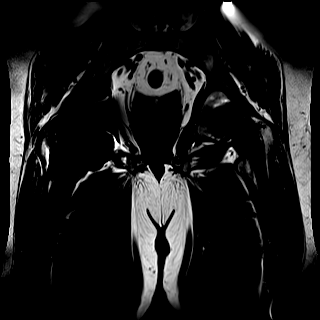
[im 16/24]
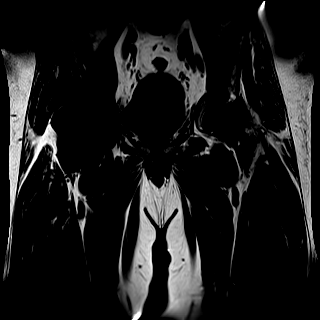
[im 24/24]
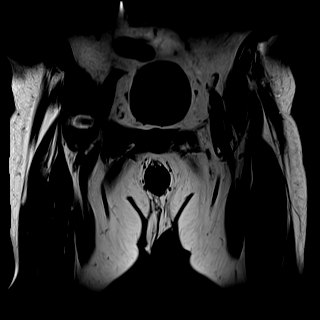

[Series 8: STIR · coronal · 4.0mm · 0.78mm/px · 3 of 24 slices shown (2 of 2)]
[im 1/24]
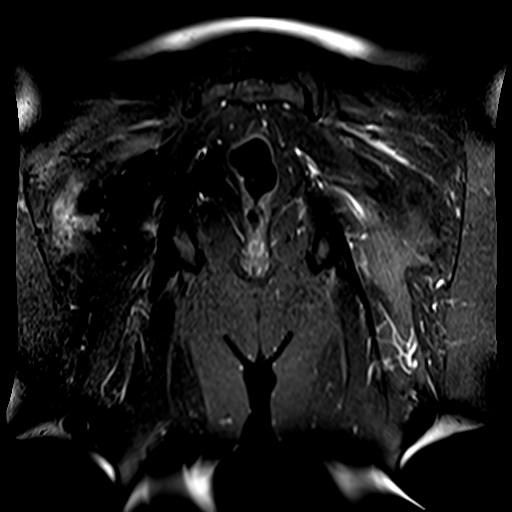
[im 8/24]
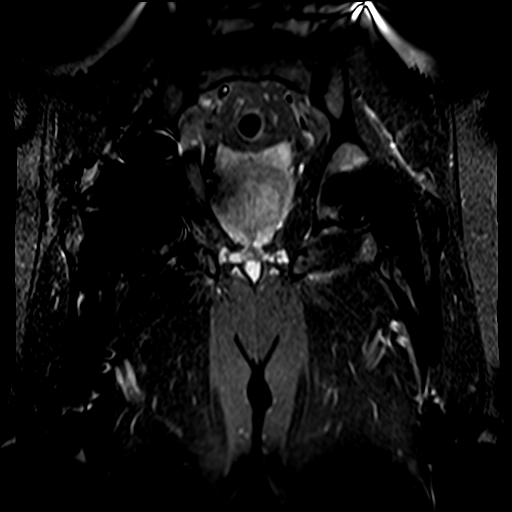
[im 16/24]
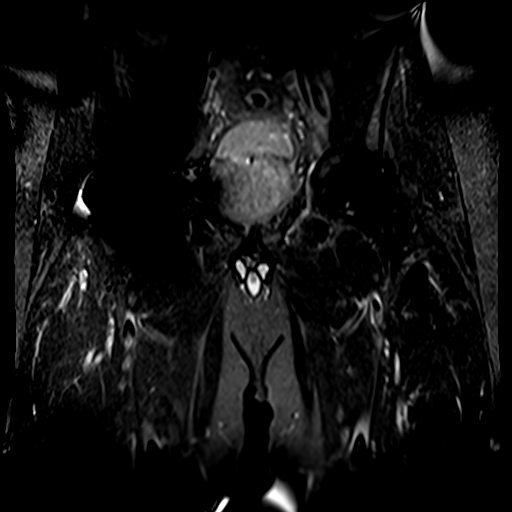

[33 of 40 positions shown; findings below may reference images not displayed]

FINDINGS: Right hip prosthesis with significant associated artifact. Findings
highly suspicious for particle disease with medial cortical
destruction just above the lesser trochanter with extruding soft
tissue material surrounding the medial aspect of the proximal femur.
There appears to be similar findings involving the acetabular
component with destructive bony change and ill-defined soft tissue
infiltrating the iliopsoas musculature.

There is also fluid around the greater trochanter which is likely
bursitis.

The left hip prosthesis appears unremarkable. No findings for
particle disease or metallosis.

The pubic symphysis and SI joints are intact. No pelvic stress or
insufficiency fracture.

No significant intrapelvic abnormalities. Prostatomegaly is noted.
Slightly prominent inguinal rings containing fat.
IMPRESSION: 1. MR findings consistent with particle disease involving
surrounding the right hip prosthesis with evidence of cortical
destruction and associated infiltrating soft tissue masses involving
both the acetabular and femoral components.
2. Trochanteric bursitis.
3. No complicating features associated with the left hip prosthesis.

## 2021-02-04 ENCOUNTER — Encounter (INDEPENDENT_AMBULATORY_CARE_PROVIDER_SITE_OTHER): Payer: Self-pay | Admitting: Family Medicine

## 2021-02-04 ENCOUNTER — Other Ambulatory Visit (INDEPENDENT_AMBULATORY_CARE_PROVIDER_SITE_OTHER): Payer: Self-pay | Admitting: Family Medicine

## 2021-02-04 DIAGNOSIS — R7303 Prediabetes: Secondary | ICD-10-CM

## 2021-02-05 NOTE — Telephone Encounter (Signed)
Last OV with Tracey 

## 2021-02-07 NOTE — Telephone Encounter (Signed)
[name] pt

## 2021-02-08 ENCOUNTER — Telehealth: Payer: Self-pay | Admitting: Pharmacist

## 2021-02-08 ENCOUNTER — Encounter (INDEPENDENT_AMBULATORY_CARE_PROVIDER_SITE_OTHER): Payer: Self-pay | Admitting: Physician Assistant

## 2021-02-08 NOTE — Progress Notes (Signed)
Chronic Care Management Pharmacy Assistant   Name: [name]  MRN: [id number] DOB: 1951/01/15  Reason for Encounter: Diabetic Adherence Call   PCP : Binnie Rail, MD  Allergies:  No Known Allergies  Medications: Outpatient Encounter Medications as of 02/08/2021  Medication Sig   aspirin EC 81 MG tablet Take 81 mg by mouth daily.   atorvastatin (LIPITOR) 80 MG tablet TAKE 1 TABLET (80 MG TOTAL) BY MOUTH DAILY AT 6 PM.   cholecalciferol (VITAMIN D3) 25 MCG (1000 UNIT) tablet Take 1,000 Units by mouth daily.   clopidogrel (PLAVIX) 75 MG tablet Take 1 tablet (75 mg total) by mouth daily.   diclofenac Sodium (VOLTAREN) 1 % GEL Apply 2 g topically 4 (four) times daily as needed (pain).   levothyroxine (SYNTHROID) 150 MCG tablet TAKE 1 TABLET BY MOUTH EVERY DAY BEFORE BREAKFAST   metFORMIN (GLUCOPHAGE) 500 MG tablet Take 1 tablet (500 mg total) by mouth daily with breakfast.   metoprolol succinate (TOPROL-XL) 25 MG 24 hr tablet Take 1 tablet (25 mg total) by mouth daily.   Multiple Vitamins-Minerals (MULTIVITAMIN) tablet Take 1 tablet by mouth daily.   nitroGLYCERIN (NITROSTAT) 0.4 MG SL tablet PLACE 1 TABLET (0.4 MG TOTAL) UNDER THE TONGUE EVERY 5 (FIVE) MINUTES AS NEEDED FOR CHEST PAIN.   polyvinyl alcohol (LIQUIFILM TEARS) 1.4 % ophthalmic solution Place 1 drop into both eyes as needed for dry eyes.   tamsulosin (FLOMAX) 0.4 MG CAPS capsule Take 0.4 mg by mouth daily.   telmisartan (MICARDIS) 80 MG tablet Take 1 tablet (80 mg total) by mouth daily.   Vitamin D, Ergocalciferol, (DRISDOL) 1.25 MG (50000 UNIT) CAPS capsule Take 1 capsule (50,000 Units total) by mouth every 7 (seven) days.   No facility-administered encounter medications on file as of 02/08/2021.    Current Diagnosis: Patient Active Problem List   Diagnosis Date Noted   Hepatic steatosis 01/03/2021   Other fatigue 01/01/2021   Shortness of breath on exertion 01/01/2021   Elevated LFTs  01/01/2021   History of ST elevation myocardial infarction (STEMI) 01/01/2021   Vitamin D deficiency 01/01/2021   Failed total hip arthroplasty ([place]) 01/04/2020   OSA treated with BiPAP 05/03/2019   CAD S/P percutaneous coronary angioplasty 01/11/2019   CAD (coronary artery disease), native coronary artery 01/11/2019   HLD (hyperlipidemia) 01/11/2019   Acute ST elevation myocardial infarction (STEMI) of inferior wall ([place]) 12/30/2018   ST elevation myocardial infarction (STEMI) ([place]) 12/30/2018   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 ([place])    Prediabetes 05/11/2017   Ventral hernia without obstruction or gangrene 44/96/7591   Umbilical hernia without obstruction or gangrene 05/11/2017   OA (osteoarthritis) of hip 04/16/2016   Pre-syncope 02/22/2016   Vertigo 02/22/2016   Knee joint replacement status 01/12/2013   OA (osteoarthritis) of knee 12/27/2012   Benign prostatic hyperplasia    Hypothyroidism 09/04/2009   Obesity 09/04/2009   Essential hypertension 09/04/2009   COLONIC POLYPS, HX OF 09/04/2009    Goals Addressed   None     Follow-Up:  Pharmacist Review    Recent Relevant Labs: Lab Results  Component Value Date/Time   HGBA1C 6.0 12/11/2020 09:46 AM   HGBA1C 5.6 12/29/2019 11:23 AM    Kidney Function Lab Results  Component Value Date/Time   CREATININE 0.89 01/01/2021 09:25 AM   CREATININE 0.78 12/11/2020 09:46 AM   GFR 92.31 12/11/2020 09:46 AM   GFRNONAA 88 01/01/2021 09:25 AM  GFRAA 102 01/01/2021 09:25 AM     Current antihyperglycemic regimen: The patient states that he takes metformin 500 mg for his diabetes    What recent interventions/DTPs have been made to improve glycemic control: The patient states that he is going to [facility]   Have there been any recent hospitalizations or ED visits since last visit with CPP? The patient states that he has not  been to the hospital or the ED recently   Patient denies hypoglycemic symptoms  Patient denies hyperglycemic symptoms  How often are you checking your blood sugar? The patient states that he does not check blood sugar at home that he usually has it done at [facility]   What are your blood sugars ranging? The patient states that his blood sugar ranges around the 120s o Fasting: NA o Before meals: NA o After meals: NA o Bedtime: NA  During the week, how often does your blood glucose drop below 70? The patient does not believe blood sugars have been lower that 70   Are you checking your feet daily/regularly? The patient states that he usually looks at his feet when putting on his shoes daily and has not noticed any swelling, sores, blisters or redness   Adherence Review: Is the patient currently on a STATIN medication? Yes, Atorvastatin Is the patient currently on ACE/ARB medication? Yes, Telmisartan Does the patient have >5 day gap between last estimated fill dates? No   Wendy Poet, [place]  [phone]

## 2021-02-09 ENCOUNTER — Other Ambulatory Visit: Payer: Self-pay | Admitting: Internal Medicine

## 2021-02-11 NOTE — Telephone Encounter (Signed)
Last OV with Tracey 

## 2021-02-12 ENCOUNTER — Encounter (INDEPENDENT_AMBULATORY_CARE_PROVIDER_SITE_OTHER): Payer: Self-pay | Admitting: Physician Assistant

## 2021-02-12 ENCOUNTER — Other Ambulatory Visit: Payer: Self-pay

## 2021-02-12 ENCOUNTER — Ambulatory Visit (INDEPENDENT_AMBULATORY_CARE_PROVIDER_SITE_OTHER): Payer: Medicare Other | Admitting: Physician Assistant

## 2021-02-12 VITALS — BP 111/66 | HR 64 | Temp 97.3°F | Ht 71.0 in | Wt 252.0 lb

## 2021-02-12 DIAGNOSIS — Z6835 Body mass index (BMI) 35.0-35.9, adult: Secondary | ICD-10-CM | POA: Diagnosis not present

## 2021-02-12 DIAGNOSIS — E785 Hyperlipidemia, unspecified: Secondary | ICD-10-CM | POA: Diagnosis not present

## 2021-02-12 DIAGNOSIS — R7303 Prediabetes: Secondary | ICD-10-CM | POA: Diagnosis not present

## 2021-02-12 DIAGNOSIS — E66812 Obesity, class 2: Secondary | ICD-10-CM

## 2021-02-12 MED ORDER — METFORMIN HCL 500 MG PO TABS: 500.0000 mg | ORAL_TABLET | Freq: Every day | ORAL | 0 refills | Status: DC

## 2021-02-13 NOTE — Progress Notes (Signed)
Chief Complaint:   OBESITY Judas is here to discuss his progress with his obesity treatment plan along with follow-up of his obesity related diagnoses. Basir is on the Category 3 Plan and keeping a food journal and adhering to recommended goals of 450-600 calories and 40 grams of protein at supper daily and states he is following his eating plan approximately 90-95% of the time. Deagen states he is swimming for 50-60 minutes 5 times per week.  Today's visit was #: 4 Starting weight: 266 lbs Starting date: 01/01/2021 Today's weight: 252 lbs Today's date: 02/12/2021 Total lbs lost to date: 14 Total lbs lost since last in-office visit: 3  Interim History: Areon is doing very well with weight loss. He is happy with the choices of additional food from our last visit, and he states that he is enjoying the plan. He is not excessively hungry.  Subjective:   1. Pre-diabetes Darin denies polyphagia, and he is tolerating metformin once daily.  2. Dyslipidemia Peighton is on Lipitor, and last lipid panel was not at goal. He is followed by Cardiology.  Assessment/Plan:   1. Pre-diabetes Dru will continue to work on weight loss, exercise, and decreasing simple carbohydrates to help decrease the risk of diabetes. We will refill metformin for 90 days with no refills.  - metFORMIN (GLUCOPHAGE) 500 MG tablet; Take 1 tablet (500 mg total) by mouth daily with breakfast.  Dispense: 90 tablet; Refill: 0  2. Dyslipidemia Tavoris will continue to follow up, and will continue his medications.  3. Class 2 severe obesity with serious comorbidity and body mass index (BMI) of 35.0 to 35.9 in adult, unspecified obesity type (HCC) Aziz is currently in the action stage of change. As such, his goal is to continue with weight loss efforts. He has agreed to the Category 3 Plan.   Exercise goals: As is.  Behavioral modification strategies: meal planning and cooking strategies and celebration  eating strategies.  Calvert has agreed to follow-up with our clinic in 2 weeks. He was informed of the importance of frequent follow-up visits to maximize his success with intensive lifestyle modifications for his multiple health conditions.   Objective:   Blood pressure 111/66, pulse 64, temperature (!) 97.3 F (36.3 C), temperature source Oral, height 5\' 11"  (1.803 m), weight 252 lb (114.3 kg), SpO2 98 %. Body mass index is 35.15 kg/m.  General: Cooperative, alert, well developed, in no acute distress. HEENT: Conjunctivae and lids unremarkable. Cardiovascular: Regular rhythm.  Lungs: Normal work of breathing. Neurologic: No focal deficits.   Lab Results  Component Value Date   CREATININE 0.89 01/01/2021   BUN 13 01/01/2021   NA 142 01/01/2021   K 4.6 01/01/2021   CL 107 (H) 01/01/2021   CO2 20 01/01/2021   Lab Results  Component Value Date   ALT 109 (H) 01/01/2021   AST 68 (H) 01/01/2021   ALKPHOS 102 01/01/2021   BILITOT 0.7 01/01/2021   Lab Results  Component Value Date   HGBA1C 6.0 12/11/2020   HGBA1C 5.6 12/29/2019   HGBA1C 6.1 11/03/2019   HGBA1C 5.4 12/31/2018   HGBA1C 5.9 08/19/2018   Lab Results  Component Value Date   INSULIN 34.7 (H) 01/01/2021   Lab Results  Component Value Date   TSH 2.57 12/11/2020   Lab Results  Component Value Date   CHOL 112 12/11/2020   HDL 34.50 (L) 12/11/2020   LDLCALC 61 12/11/2020   LDLDIRECT 153.1 06/09/2011   TRIG 80.0 12/11/2020  CHOLHDL 3 12/11/2020   Lab Results  Component Value Date   WBC 4.4 12/11/2020   HGB 14.8 12/11/2020   HCT 43.1 12/11/2020   MCV 88.4 12/11/2020   PLT 141.0 (L) 12/11/2020   No results found for: IRON, TIBC, FERRITIN  Obesity Behavioral Intervention:   Approximately 15 minutes were spent on the discussion below.  ASK: We discussed the diagnosis of obesity with [name] today and Elijha agreed to give [place] permission to discuss obesity behavioral modification therapy  today.  ASSESS: Jahrel has the diagnosis of obesity and his BMI today is 35.16. Tsutomu is in the action stage of change.   ADVISE: Tullio was educated on the multiple health risks of obesity as well as the benefit of weight loss to improve his health. He was advised of the need for long term treatment and the importance of lifestyle modifications to improve his current health and to decrease his risk of future health problems.  AGREE: Multiple dietary modification options and treatment options were discussed and Rexford agreed to follow the recommendations documented in the above note.  ARRANGE: Benancio was educated on the importance of frequent visits to treat obesity as outlined per CMS and USPSTF guidelines and agreed to schedule his next follow up appointment today.  Attestation Statements:   Reviewed by clinician on day of visit: allergies, medications, problem list, medical history, surgical history, family history, social history, and previous encounter notes.   Wilhemena Durie, am acting as transcriptionist for [employer], PA-C.  I have reviewed the above documentation for accuracy and completeness, and I agree with the above. Abby Potash, PA-C

## 2021-02-25 ENCOUNTER — Telehealth: Payer: Self-pay | Admitting: Pharmacist

## 2021-02-25 NOTE — Progress Notes (Signed)
    Chronic Care Management Pharmacy Assistant   Name: [name]  MRN: [id number] DOB: [birth year]  Reason for Encounter: Chart Review   PCP : Binnie Rail, MD  Allergies:  No Known Allergies  Medications: Outpatient Encounter Medications as of 02/25/2021  Medication Sig  . aspirin EC 81 MG tablet Take 81 mg by mouth daily.  Marland Kitchen atorvastatin (LIPITOR) 80 MG tablet TAKE 1 TABLET (80 MG TOTAL) BY MOUTH DAILY AT 6 PM.  . cholecalciferol (VITAMIN D3) 25 MCG (1000 UNIT) tablet Take 1,000 Units by mouth daily.  . clopidogrel (PLAVIX) 75 MG tablet Take 1 tablet (75 mg total) by mouth daily.  . diclofenac Sodium (VOLTAREN) 1 % GEL Apply 2 g topically 4 (four) times daily as needed (pain).  Marland Kitchen levothyroxine (SYNTHROID) 150 MCG tablet TAKE 1 TABLET BY MOUTH EVERY DAY BEFORE BREAKFAST  . metFORMIN (GLUCOPHAGE) 500 MG tablet Take 1 tablet (500 mg total) by mouth daily with breakfast.  . metoprolol succinate (TOPROL-XL) 25 MG 24 hr tablet Take 1 tablet (25 mg total) by mouth daily.  . Multiple Vitamins-Minerals (MULTIVITAMIN) tablet Take 1 tablet by mouth daily.  . nitroGLYCERIN (NITROSTAT) 0.4 MG SL tablet PLACE 1 TABLET (0.4 MG TOTAL) UNDER THE TONGUE EVERY 5 (FIVE) MINUTES AS NEEDED FOR CHEST PAIN.  Marland Kitchen polyvinyl alcohol (LIQUIFILM TEARS) 1.4 % ophthalmic solution Place 1 drop into both eyes as needed for dry eyes.  . tamsulosin (FLOMAX) 0.4 MG CAPS capsule Take 0.4 mg by mouth daily.  Marland Kitchen telmisartan (MICARDIS) 80 MG tablet Take 1 tablet (80 mg total) by mouth daily.  . Vitamin D, Ergocalciferol, (DRISDOL) 1.25 MG (50000 UNIT) CAPS capsule Take 1 capsule (50,000 Units total) by mouth every 7 (seven) days.   No facility-administered encounter medications on file as of 02/25/2021.    Current Diagnosis: Patient Active Problem List   Diagnosis Date Noted  . Hepatic steatosis 01/03/2021  . Other fatigue 01/01/2021  . Shortness of breath on exertion 01/01/2021  . Elevated LFTs 01/01/2021  .  History of ST elevation myocardial infarction (STEMI) 01/01/2021  . Vitamin D deficiency 01/01/2021  . 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 OSA treated with BiPAP 05/03/2019  . CAD S/P percutaneous coronary angioplasty 01/11/2019  . CAD (coronary artery disease), native coronary artery 01/11/2019  . HLD (hyperlipidemia) 01/11/2019  . Acute ST elevation myocardial infarction (STEMI) of inferior wall ([place]) 12/30/2018  . ST elevation myocardial infarction (STEMI) ([place]) 12/30/2018  . 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 Prediabetes 05/11/2017  . Ventral hernia without obstruction or gangrene 05/11/2017  . Umbilical hernia without obstruction or gangrene 05/11/2017  . OA (osteoarthritis) of hip 04/16/2016  . Pre-syncope 02/22/2016  . Vertigo 02/22/2016  . Knee joint replacement status 01/12/2013  . OA (osteoarthritis) of knee 12/27/2012  . Benign prostatic hyperplasia   . Hypothyroidism 09/04/2009  . Obesity 09/04/2009  . Essential hypertension 09/04/2009  . COLONIC POLYPS, HX OF 09/04/2009    Goals Addressed   None     Follow-Up:  Pharmacist Review   Reviewed chart for medication changes and adherence.   No gaps in adherence identified. Patient has follow up scheduled with pharmacy team. No further action required.   Wendy Poet, [place] [phone]

## 2021-02-26 ENCOUNTER — Ambulatory Visit (INDEPENDENT_AMBULATORY_CARE_PROVIDER_SITE_OTHER): Payer: Medicare Other | Admitting: Physician Assistant

## 2021-02-26 ENCOUNTER — Other Ambulatory Visit: Payer: Self-pay

## 2021-02-26 ENCOUNTER — Encounter (INDEPENDENT_AMBULATORY_CARE_PROVIDER_SITE_OTHER): Payer: Self-pay | Admitting: Physician Assistant

## 2021-02-26 VITALS — BP 113/78 | HR 65 | Temp 97.8°F | Ht 71.0 in | Wt 249.0 lb

## 2021-02-26 DIAGNOSIS — Z6835 Body mass index (BMI) 35.0-35.9, adult: Secondary | ICD-10-CM | POA: Diagnosis not present

## 2021-02-26 DIAGNOSIS — I10 Essential (primary) hypertension: Secondary | ICD-10-CM | POA: Diagnosis not present

## 2021-02-26 DIAGNOSIS — E559 Vitamin D deficiency, unspecified: Secondary | ICD-10-CM | POA: Diagnosis not present

## 2021-02-26 MED ORDER — VITAMIN D (ERGOCALCIFEROL) 1.25 MG (50000 UNIT) PO CAPS
50000.0000 [IU] | ORAL_CAPSULE | ORAL | 0 refills | Status: DC
Start: 2021-02-26 — End: 2021-03-28

## 2021-02-27 NOTE — Progress Notes (Signed)
Chief Complaint:   OBESITY Demorio is here to discuss his progress with his obesity treatment plan along with follow-up of his obesity related diagnoses. Benz is on the Category 3 Plan and states he is following his eating plan approximately 95% of the time. Anakin states he is swimming for 60 minutes 4-5 times per week.  Today's visit was #: 5 Starting weight: 266 lbs Starting date: 01/01/2021 Today's weight: 249 lbs Today's date: 02/26/2021 Total lbs lost to date: 17 Total lbs lost since last in-office visit: 3  Interim History: Marqual did very well with weight loss. He reports that he is bored with dinner. He is traveling to the beach in the next week for 4 days.  Subjective:   1. Vitamin D deficiency Selden is on Vit D weekly, and he denies nausea, vomiting, or muscle weakness.  2. Essential hypertension Gabrielle's blood pressure is well controlled. He is followed by Cardiology.  Assessment/Plan:   1. Vitamin D deficiency Low Vitamin D level contributes to fatigue and are associated with obesity, breast, and colon cancer. We will refill prescription Vitamin D for 1 month. Demitris will follow-up for routine testing of Vitamin D, at least 2-3 times per year to avoid over-replacement.  - Vitamin D, Ergocalciferol, (DRISDOL) 1.25 MG (50000 UNIT) CAPS capsule; Take 1 capsule (50,000 Units total) by mouth every 7 (seven) days.  Dispense: 4 capsule; Refill: 0  2. Essential hypertension Kyrillos will continue his medications, and follow up with Cardiology. He will continue working on healthy weight loss and exercise to improve blood pressure control. We will watch for signs of hypotension as he continues his lifestyle modifications.  3. Class 2 severe obesity with serious comorbidity and body mass index (BMI) of 35.0 to 35.9 in adult, unspecified obesity type (HCC) Ahmarion is currently in the action stage of change. As such, his goal is to continue with weight loss efforts. He  has agreed to the Category 3 Plan and keeping a food journal and adhering to recommended goals of 450-600 calories and 40 grams of protein at supper daily.   Exercise goals: As is.  Behavioral modification strategies: travel eating strategies and celebration eating strategies.  Ji has agreed to follow-up with our clinic in 2 weeks. He was informed of the importance of frequent follow-up visits to maximize his success with intensive lifestyle modifications for his multiple health conditions.   Objective:   Blood pressure 113/78, pulse 65, temperature 97.8 F (36.6 C), height 5\' 11"  (1.803 m), weight 249 lb (112.9 kg), SpO2 98 %. Body mass index is 34.73 kg/m.  General: Cooperative, alert, well developed, in no acute distress. HEENT: Conjunctivae and lids unremarkable. Cardiovascular: Regular rhythm.  Lungs: Normal work of breathing. Neurologic: No focal deficits.   Lab Results  Component Value Date   CREATININE 0.89 01/01/2021   BUN 13 01/01/2021   NA 142 01/01/2021   K 4.6 01/01/2021   CL 107 (H) 01/01/2021   CO2 20 01/01/2021   Lab Results  Component Value Date   ALT 109 (H) 01/01/2021   AST 68 (H) 01/01/2021   ALKPHOS 102 01/01/2021   BILITOT 0.7 01/01/2021   Lab Results  Component Value Date   HGBA1C 6.0 12/11/2020   HGBA1C 5.6 12/29/2019   HGBA1C 6.1 11/03/2019   HGBA1C 5.4 12/31/2018   HGBA1C 5.9 08/19/2018   Lab Results  Component Value Date   INSULIN 34.7 (H) 01/01/2021   Lab Results  Component Value Date   TSH  2.57 12/11/2020   Lab Results  Component Value Date   CHOL 112 12/11/2020   HDL 34.50 (L) 12/11/2020   LDLCALC 61 12/11/2020   LDLDIRECT 153.1 06/09/2011   TRIG 80.0 12/11/2020   CHOLHDL 3 12/11/2020   Lab Results  Component Value Date   WBC 4.4 12/11/2020   HGB 14.8 12/11/2020   HCT 43.1 12/11/2020   MCV 88.4 12/11/2020   PLT 141.0 (L) 12/11/2020   No results found for: IRON, TIBC, FERRITIN  Obesity Behavioral Intervention:    Approximately 15 minutes were spent on the discussion below.  ASK: We discussed the diagnosis of obesity with [name] today and Cleave agreed to give [place] permission to discuss obesity behavioral modification therapy today.  ASSESS: Edwing has the diagnosis of obesity and his BMI today is 34.74. Spence is in the action stage of change.   ADVISE: Adebayo was educated on the multiple health risks of obesity as well as the benefit of weight loss to improve his health. He was advised of the need for long term treatment and the importance of lifestyle modifications to improve his current health and to decrease his risk of future health problems.  AGREE: Multiple dietary modification options and treatment options were discussed and Faruq agreed to follow the recommendations documented in the above note.  ARRANGE: Hoa was educated on the importance of frequent visits to treat obesity as outlined per CMS and USPSTF guidelines and agreed to schedule his next follow up appointment today.  Attestation Statements:   Reviewed by clinician on day of visit: allergies, medications, problem list, medical history, surgical history, family history, social history, and previous encounter notes.   Wilhemena Durie, am acting as transcriptionist for [employer], PA-C.  I have reviewed the above documentation for accuracy and completeness, and I agree with the above. Abby Potash, PA-C

## 2021-03-12 ENCOUNTER — Emergency Department (HOSPITAL_COMMUNITY): Payer: Medicare Other

## 2021-03-12 ENCOUNTER — Other Ambulatory Visit: Payer: Self-pay

## 2021-03-12 ENCOUNTER — Observation Stay (HOSPITAL_COMMUNITY)
Admission: EM | Admit: 2021-03-12 | Discharge: 2021-03-13 | Disposition: A | Discharge status: Home / Self Care | Payer: Medicare Other | Attending: Orthopedic Surgery | Admitting: Orthopedic Surgery

## 2021-03-12 ENCOUNTER — Inpatient Hospital Stay (HOSPITAL_COMMUNITY): Payer: Medicare Other

## 2021-03-12 ENCOUNTER — Encounter (HOSPITAL_COMMUNITY): Payer: Self-pay

## 2021-03-12 ENCOUNTER — Inpatient Hospital Stay (HOSPITAL_COMMUNITY): Payer: Medicare Other | Admitting: Anesthesiology

## 2021-03-12 ENCOUNTER — Encounter (HOSPITAL_COMMUNITY)
Admission: EM | Disposition: A | Discharge status: Home / Self Care | Payer: Self-pay | Source: Home / Self Care | Attending: Emergency Medicine

## 2021-03-12 DIAGNOSIS — Z96643 Presence of artificial hip joint, bilateral: Secondary | ICD-10-CM | POA: Diagnosis not present

## 2021-03-12 DIAGNOSIS — Z7982 Long term (current) use of aspirin: Secondary | ICD-10-CM | POA: Insufficient documentation

## 2021-03-12 DIAGNOSIS — M1710 Unilateral primary osteoarthritis, unspecified knee: Secondary | ICD-10-CM | POA: Diagnosis present

## 2021-03-12 DIAGNOSIS — S73014A Posterior dislocation of right hip, initial encounter: Secondary | ICD-10-CM

## 2021-03-12 DIAGNOSIS — I2510 Atherosclerotic heart disease of native coronary artery without angina pectoris: Secondary | ICD-10-CM | POA: Diagnosis not present

## 2021-03-12 DIAGNOSIS — Z7984 Long term (current) use of oral hypoglycemic drugs: Secondary | ICD-10-CM | POA: Insufficient documentation

## 2021-03-12 DIAGNOSIS — E782 Mixed hyperlipidemia: Secondary | ICD-10-CM | POA: Diagnosis not present

## 2021-03-12 DIAGNOSIS — Z79899 Other long term (current) drug therapy: Secondary | ICD-10-CM | POA: Diagnosis not present

## 2021-03-12 DIAGNOSIS — I252 Old myocardial infarction: Secondary | ICD-10-CM | POA: Diagnosis not present

## 2021-03-12 DIAGNOSIS — R001 Bradycardia, unspecified: Secondary | ICD-10-CM

## 2021-03-12 DIAGNOSIS — T84020A Dislocation of internal right hip prosthesis, initial encounter: Secondary | ICD-10-CM | POA: Diagnosis not present

## 2021-03-12 DIAGNOSIS — X58XXXA Exposure to other specified factors, initial encounter: Secondary | ICD-10-CM | POA: Diagnosis not present

## 2021-03-12 DIAGNOSIS — Z20822 Contact with and (suspected) exposure to covid-19: Secondary | ICD-10-CM | POA: Insufficient documentation

## 2021-03-12 DIAGNOSIS — Z96641 Presence of right artificial hip joint: Secondary | ICD-10-CM | POA: Diagnosis not present

## 2021-03-12 DIAGNOSIS — E034 Atrophy of thyroid (acquired): Secondary | ICD-10-CM

## 2021-03-12 DIAGNOSIS — M170 Bilateral primary osteoarthritis of knee: Secondary | ICD-10-CM

## 2021-03-12 DIAGNOSIS — R7303 Prediabetes: Secondary | ICD-10-CM | POA: Diagnosis not present

## 2021-03-12 DIAGNOSIS — Z96653 Presence of artificial knee joint, bilateral: Secondary | ICD-10-CM | POA: Diagnosis not present

## 2021-03-12 DIAGNOSIS — M179 Osteoarthritis of knee, unspecified: Secondary | ICD-10-CM | POA: Diagnosis present

## 2021-03-12 DIAGNOSIS — I10 Essential (primary) hypertension: Secondary | ICD-10-CM | POA: Insufficient documentation

## 2021-03-12 DIAGNOSIS — S73004A Unspecified dislocation of right hip, initial encounter: Secondary | ICD-10-CM | POA: Diagnosis not present

## 2021-03-12 DIAGNOSIS — Z471 Aftercare following joint replacement surgery: Secondary | ICD-10-CM | POA: Diagnosis not present

## 2021-03-12 DIAGNOSIS — Z419 Encounter for procedure for purposes other than remedying health state, unspecified: Secondary | ICD-10-CM

## 2021-03-12 DIAGNOSIS — Z9861 Coronary angioplasty status: Secondary | ICD-10-CM | POA: Diagnosis not present

## 2021-03-12 DIAGNOSIS — E785 Hyperlipidemia, unspecified: Secondary | ICD-10-CM | POA: Diagnosis present

## 2021-03-12 DIAGNOSIS — Z9889 Other specified postprocedural states: Secondary | ICD-10-CM

## 2021-03-12 DIAGNOSIS — E039 Hypothyroidism, unspecified: Secondary | ICD-10-CM | POA: Diagnosis not present

## 2021-03-12 DIAGNOSIS — Z87828 Personal history of other (healed) physical injury and trauma: Secondary | ICD-10-CM | POA: Diagnosis not present

## 2021-03-12 DIAGNOSIS — M25551 Pain in right hip: Secondary | ICD-10-CM | POA: Diagnosis not present

## 2021-03-12 HISTORY — PX: HIP CLOSED REDUCTION: SHX983

## 2021-03-12 LAB — CBC WITH DIFFERENTIAL/PLATELET
Abs Immature Granulocytes: 0.02 10*3/uL (ref 0.00–0.07)
Basophils Absolute: 0 10*3/uL (ref 0.0–0.1)
Basophils Relative: 0 %
Eosinophils Absolute: 0.1 10*3/uL (ref 0.0–0.5)
Eosinophils Relative: 2 %
HCT: 40 % (ref 39.0–52.0)
Hemoglobin: 13.8 g/dL (ref 13.0–17.0)
Immature Granulocytes: 0 %
Lymphocytes Relative: 12 %
Lymphs Abs: 0.5 10*3/uL — ABNORMAL LOW (ref 0.7–4.0)
MCH: 30.9 pg (ref 26.0–34.0)
MCHC: 34.5 g/dL (ref 30.0–36.0)
MCV: 89.5 fL (ref 80.0–100.0)
Monocytes Absolute: 0.5 10*3/uL (ref 0.1–1.0)
Monocytes Relative: 10 %
Neutro Abs: 3.5 10*3/uL (ref 1.7–7.7)
Neutrophils Relative %: 76 %
Platelets: 128 10*3/uL — ABNORMAL LOW (ref 150–400)
RBC: 4.47 MIL/uL (ref 4.22–5.81)
RDW: 12.6 % (ref 11.5–15.5)
WBC: 4.6 10*3/uL (ref 4.0–10.5)
nRBC: 0 % (ref 0.0–0.2)

## 2021-03-12 LAB — BASIC METABOLIC PANEL
Anion gap: 9 (ref 5–15)
BUN: 15 mg/dL (ref 8–23)
CO2: 23 mmol/L (ref 22–32)
Calcium: 9.5 mg/dL (ref 8.9–10.3)
Chloride: 110 mmol/L (ref 98–111)
Creatinine, Ser: 0.77 mg/dL (ref 0.61–1.24)
GFR, Estimated: >60 mL/min (ref >60)
Glucose, Bld: 132 mg/dL — ABNORMAL HIGH (ref 70–99)
Potassium: 3.7 mmol/L (ref 3.5–5.1)
Sodium: 142 mmol/L (ref 135–145)

## 2021-03-12 LAB — GLUCOSE, CAPILLARY: Glucose-Capillary: 91 mg/dL (ref 70–99)

## 2021-03-12 LAB — PSA: Prostatic Specific Antigen: 10.67 ng/mL — ABNORMAL HIGH (ref 0.00–4.00)

## 2021-03-12 LAB — RESP PANEL BY RT-PCR (FLU A&B, COVID) ARPGX2
Influenza A by PCR: NEGATIVE
Influenza B by PCR: NEGATIVE
SARS Coronavirus 2 by RT PCR: NEGATIVE

## 2021-03-12 LAB — HIV ANTIBODY (ROUTINE TESTING W REFLEX): HIV Screen 4th Generation wRfx: NONREACTIVE

## 2021-03-12 SURGERY — CLOSED REDUCTION, HIP
Anesthesia: General | Site: Hip | Laterality: Right

## 2021-03-12 MED ORDER — ATORVASTATIN CALCIUM 40 MG PO TABS
80.0000 mg | ORAL_TABLET | Freq: Every day | ORAL | Status: DC
  Administered 2021-03-12: 80 mg via ORAL
  Filled 2021-03-12: qty 2

## 2021-03-12 MED ORDER — ONDANSETRON HCL 4 MG PO TABS: 4.0000 mg | ORAL_TABLET | Freq: Four times a day (QID) | ORAL | Status: DC | PRN

## 2021-03-12 MED ORDER — POLYETHYLENE GLYCOL 3350 17 G PO PACK: 17.0000 g | PACK | Freq: Every day | ORAL | Status: DC | PRN

## 2021-03-12 MED ORDER — INSULIN ASPART 100 UNIT/ML ~~LOC~~ SOLN: 0.0000 [IU] | Freq: Every day | SUBCUTANEOUS | Status: DC

## 2021-03-12 MED ORDER — METOCLOPRAMIDE HCL 5 MG/ML IJ SOLN
5.0000 mg | Freq: Three times a day (TID) | INTRAMUSCULAR | Status: DC | PRN
Start: 2021-03-12 — End: 2021-03-13

## 2021-03-12 MED ORDER — MIDAZOLAM HCL 2 MG/2ML IJ SOLN
INTRAMUSCULAR | Status: DC | PRN
  Administered 2021-03-12: 2 mg via INTRAVENOUS

## 2021-03-12 MED ORDER — PROPOFOL 10 MG/ML IV BOLUS
INTRAVENOUS | Status: DC | PRN
  Administered 2021-03-12: 200 mg via INTRAVENOUS

## 2021-03-12 MED ORDER — MIDAZOLAM HCL 2 MG/2ML IJ SOLN
INTRAMUSCULAR | Status: AC
  Filled 2021-03-12: qty 2

## 2021-03-12 MED ORDER — ACETAMINOPHEN 500 MG PO TABS
500.0000 mg | ORAL_TABLET | Freq: Four times a day (QID) | ORAL | Status: DC
  Filled 2021-03-12: qty 1

## 2021-03-12 MED ORDER — OXYCODONE HCL 5 MG/5ML PO SOLN: 5.0000 mg | Freq: Once | ORAL | Status: DC | PRN

## 2021-03-12 MED ORDER — FENTANYL CITRATE (PF) 100 MCG/2ML IJ SOLN: 25.0000 ug | INTRAMUSCULAR | Status: DC | PRN

## 2021-03-12 MED ORDER — LACTATED RINGERS IV SOLN: INTRAVENOUS | Status: DC | PRN

## 2021-03-12 MED ORDER — LIDOCAINE HCL (CARDIAC) PF 100 MG/5ML IV SOSY
PREFILLED_SYRINGE | INTRAVENOUS | Status: DC | PRN
  Administered 2021-03-12: 100 mg via INTRAVENOUS

## 2021-03-12 MED ORDER — ACETAMINOPHEN 325 MG PO TABS: 325.0000 mg | ORAL_TABLET | Freq: Four times a day (QID) | ORAL | Status: DC | PRN

## 2021-03-12 MED ORDER — PROPOFOL 10 MG/ML IV BOLUS
INTRAVENOUS | Status: AC
  Filled 2021-03-12: qty 20

## 2021-03-12 MED ORDER — FENTANYL CITRATE (PF) 100 MCG/2ML IJ SOLN
INTRAMUSCULAR | Status: DC | PRN
  Administered 2021-03-12: 100 ug via INTRAVENOUS

## 2021-03-12 MED ORDER — FENTANYL CITRATE (PF) 100 MCG/2ML IJ SOLN
50.0000 ug | Freq: Once | INTRAMUSCULAR | Status: AC
  Administered 2021-03-12: 50 ug via INTRAVENOUS
  Filled 2021-03-12: qty 2

## 2021-03-12 MED ORDER — ONDANSETRON HCL 4 MG/2ML IJ SOLN
INTRAMUSCULAR | Status: DC | PRN
  Administered 2021-03-12: 4 mg via INTRAVENOUS

## 2021-03-12 MED ORDER — ACETAMINOPHEN 500 MG PO TABS
1000.0000 mg | ORAL_TABLET | Freq: Once | ORAL | Status: DC
  Filled 2021-03-12: qty 2

## 2021-03-12 MED ORDER — CLOPIDOGREL BISULFATE 75 MG PO TABS
75.0000 mg | ORAL_TABLET | Freq: Every day | ORAL | Status: DC
  Administered 2021-03-13: 75 mg via ORAL
  Filled 2021-03-12 (×2): qty 1

## 2021-03-12 MED ORDER — INSULIN ASPART 100 UNIT/ML ~~LOC~~ SOLN: 0.0000 [IU] | Freq: Three times a day (TID) | SUBCUTANEOUS | Status: DC

## 2021-03-12 MED ORDER — DOCUSATE SODIUM 100 MG PO CAPS
100.0000 mg | ORAL_CAPSULE | Freq: Two times a day (BID) | ORAL | Status: DC
  Administered 2021-03-12 – 2021-03-13 (×2): 100 mg via ORAL
  Filled 2021-03-12 (×2): qty 1

## 2021-03-12 MED ORDER — PROMETHAZINE HCL 25 MG/ML IJ SOLN: 6.2500 mg | INTRAMUSCULAR | Status: DC | PRN

## 2021-03-12 MED ORDER — ONDANSETRON HCL 4 MG/2ML IJ SOLN
INTRAMUSCULAR | Status: AC
  Filled 2021-03-12: qty 2

## 2021-03-12 MED ORDER — PROPOFOL 10 MG/ML IV BOLUS
1.0000 mg/kg | Freq: Once | INTRAVENOUS | Status: DC
  Filled 2021-03-12: qty 20

## 2021-03-12 MED ORDER — PROPOFOL 10 MG/ML IV BOLUS
INTRAVENOUS | Status: AC | PRN
  Administered 2021-03-12 (×2): 60 mg via INTRAVENOUS
  Administered 2021-03-12: 40 mg via INTRAVENOUS

## 2021-03-12 MED ORDER — HYDRALAZINE HCL 20 MG/ML IJ SOLN
10.0000 mg | Freq: Three times a day (TID) | INTRAMUSCULAR | Status: DC | PRN
Start: 2021-03-12 — End: 2021-03-13

## 2021-03-12 MED ORDER — HYDROMORPHONE HCL 1 MG/ML IJ SOLN
1.0000 mg | Freq: Once | INTRAMUSCULAR | Status: AC
Start: 2021-03-12 — End: 2021-03-12
  Administered 2021-03-12: 1 mg via INTRAVENOUS
  Filled 2021-03-12: qty 1

## 2021-03-12 MED ORDER — FENTANYL CITRATE (PF) 100 MCG/2ML IJ SOLN
INTRAMUSCULAR | Status: AC
  Filled 2021-03-12: qty 2

## 2021-03-12 MED ORDER — HYDROCODONE-ACETAMINOPHEN 5-325 MG PO TABS: 1.0000 | ORAL_TABLET | ORAL | Status: DC | PRN

## 2021-03-12 MED ORDER — LEVOTHYROXINE SODIUM 50 MCG PO TABS
150.0000 ug | ORAL_TABLET | Freq: Every day | ORAL | Status: DC
  Administered 2021-03-13: 150 ug via ORAL
  Filled 2021-03-12: qty 1

## 2021-03-12 MED ORDER — MORPHINE SULFATE (PF) 4 MG/ML IV SOLN
4.0000 mg | Freq: Once | INTRAVENOUS | Status: AC
Start: 2021-03-12 — End: 2021-03-12
  Administered 2021-03-12: 4 mg via INTRAVENOUS
  Filled 2021-03-12: qty 1

## 2021-03-12 MED ORDER — OXYCODONE HCL 5 MG PO TABS
5.0000 mg | ORAL_TABLET | Freq: Once | ORAL | Status: DC | PRN
Start: 2021-03-12 — End: 2021-03-12

## 2021-03-12 MED ORDER — METOCLOPRAMIDE HCL 5 MG PO TABS: 5.0000 mg | ORAL_TABLET | Freq: Three times a day (TID) | ORAL | Status: DC | PRN

## 2021-03-12 MED ORDER — LIDOCAINE 2% (20 MG/ML) 5 ML SYRINGE
INTRAMUSCULAR | Status: AC
  Filled 2021-03-12: qty 5

## 2021-03-12 MED ORDER — MORPHINE SULFATE (PF) 2 MG/ML IV SOLN
2.0000 mg | INTRAVENOUS | Status: DC | PRN
  Administered 2021-03-12 (×2): 2 mg via INTRAVENOUS
  Filled 2021-03-12 (×2): qty 1

## 2021-03-12 MED ORDER — ONDANSETRON HCL 4 MG/2ML IJ SOLN: 4.0000 mg | Freq: Four times a day (QID) | INTRAMUSCULAR | Status: DC | PRN

## 2021-03-12 MED ORDER — ASPIRIN EC 81 MG PO TBEC
81.0000 mg | DELAYED_RELEASE_TABLET | Freq: Every day | ORAL | Status: DC
  Administered 2021-03-13: 81 mg via ORAL
  Filled 2021-03-12 (×2): qty 1

## 2021-03-12 MED ORDER — SODIUM CHLORIDE 0.9% FLUSH
3.0000 mL | Freq: Two times a day (BID) | INTRAVENOUS | Status: DC
  Administered 2021-03-12 (×2): 3 mL via INTRAVENOUS

## 2021-03-12 MED ORDER — TAMSULOSIN HCL 0.4 MG PO CAPS
0.4000 mg | ORAL_CAPSULE | Freq: Every day | ORAL | Status: DC
  Filled 2021-03-12 (×2): qty 1

## 2021-03-12 NOTE — Plan of Care (Signed)
  Problem: Pain Managment: Goal: General experience of comfort will improve Description: Patient's general experience of comfort will improve by 03/16/21 Outcome: Progressing

## 2021-03-12 NOTE — H&P (Signed)
History and Physical        Hospital Admission Note Date: 03/12/2021  Patient name: [name] Medical record number: [id number] Date of birth: 23-Jan-1951 Age: 70 y.o. Gender: male  PCP: Binnie Rail, MD    Chief Complaint    Chief Complaint  Patient presents with  . Hip Pain      HPI:   This is a 70 year old male with past medical history of CAD, bilateral knee and hip replacement, right hip dislocation 6 mo ago, hypertension, hyperlipidemia, inferior STEMI s/p DES on DAPT, OA, OSA, paroxysmal atrial fibrillation not on anticoagulation who presented to the ED with right hip pain from this morning. Patient swims regularly, however the chlorine dries out his skin. He was attempting to put lotion on his leg this morning when he lifted his right leg up and felt his hip pop.  This feels similar to his prior hip dislocation. Described as aching and constant. Has sensation in his right foot. No other complaints. Of note, he states his resting HR is in the 50s. He has not taken his meds yet today, including beta blocker.   ED Course: Afebrile, bradycardic,  hypertensive, on room air. Notable Labs: Labs unremarkable, COVID-19 negative. Notable Imaging: Right hip XR-dislocated right hip prosthesis.  Patient underwent conscious sedation for right hip reduction in the ED however this was unsuccessful.  Orthopedic surgery was consulted by the ED.  Patient received fentanyl, Dilaudid, morphine, propofol   Vitals:   03/12/21 1200 03/12/21 1335  BP: (!) 158/83 (!) 143/82  Pulse: (!) 54 (!) 58  Resp: 15 14  Temp:    SpO2: 98% 100%     Review of Systems:  Review of Systems  All other systems reviewed and are negative.   Medical/Social/Family History   Past Medical History: Past Medical History:  Diagnosis Date  . Back pain   . BPH (benign prostatic hypertrophy)   . Chest  pain   . COLONIC POLYPS, HX OF 2007   clear colo w/o polyps 04/2015: 69yr follow up  . Constipation   . Coronary artery disease   . Decreased mobility    due to BL knee and hip replacement  . Fatigue   . GERD (gastroesophageal reflux disease)   . Heartburn   . History of kidney stones    passed  . Hyperlipidemia   . HYPERTENSION   . HYPOTHYROIDISM   . Joint pain   . Myocardial infarction ([place]) 12/2018   Inferior STEMI  . OA (osteoarthritis)    Knees  . OBESITY   . OSA treated with BiPAP   . PAF (paroxysmal atrial fibrillation) ([place])   . Pre-diabetes   . Shortness of breath on exertion   . Umbilical hernia   . Ventral hernia     Past Surgical History:  Procedure Laterality Date  . arthroscopic knee surgery     (R) 2003 & (L) 2010  . CARDIAC CATHETERIZATION    . COLONOSCOPY  04/2015   no polyps ([place])  . CORONARY/GRAFT ACUTE MI REVASCULARIZATION N/A 12/30/2018   Procedure: Coronary/Graft Acute MI Revascularization;  Surgeon: Belva Crome, MD;  Location: [facility];  Service: Cardiovascular;  Laterality: N/A;  .  LAMINECTOMY  1991  . LEFT HEART CATH AND CORONARY ANGIOGRAPHY N/A 12/30/2018   Procedure: LEFT HEART CATH AND CORONARY ANGIOGRAPHY;  Surgeon: Belva Crome, MD;  Location: [facility];  Service: Cardiovascular;  Laterality: N/A;  . TONSILLECTOMY  1990   w/ adenoids and uvula  . TOTAL HIP ARTHROPLASTY Right 01/2011   [place]  . TOTAL HIP ARTHROPLASTY Left 04/16/2016   Procedure: TOTAL HIP ARTHROPLASTY ANTERIOR APPROACH;  Surgeon: Gaynelle Arabian, MD;  Location: [facility];  Service: Orthopedics;  Laterality: Left;  . TOTAL HIP REVISION Right 01/04/2020   Procedure: Right hip bearing surface;  Surgeon: Gaynelle Arabian, MD;  Location: [facility];  Service: Orthopedics;  Laterality: Right;  123min  . TOTAL KNEE ARTHROPLASTY  12/27/2012   Procedure: TOTAL KNEE BILATERAL;  Surgeon: Gearlean Alf, MD;  Location: [facility];  Service: Orthopedics;  Laterality: Bilateral;     Medications: Prior to Admission medications   Medication Sig Start Date End Date Taking? Authorizing Provider  aspirin EC 81 MG tablet Take 81 mg by mouth daily.    [provider]  atorvastatin (LIPITOR) 80 MG tablet TAKE 1 TABLET (80 MG TOTAL) BY MOUTH DAILY AT 6 PM. 11/28/20   Turner, Eber Hong, MD  cholecalciferol (VITAMIN D3) 25 MCG (1000 UNIT) tablet Take 1,000 Units by mouth daily.    [provider]  clopidogrel (PLAVIX) 75 MG tablet Take 1 tablet (75 mg total) by mouth daily. 10/02/20   Sueanne Margarita, MD  diclofenac Sodium (VOLTAREN) 1 % GEL Apply 2 g topically 4 (four) times daily as needed (pain).    [provider]  levothyroxine (SYNTHROID) 150 MCG tablet TAKE 1 TABLET BY MOUTH EVERY DAY BEFORE BREAKFAST 02/11/21   Binnie Rail, MD  metFORMIN (GLUCOPHAGE) 500 MG tablet Take 1 tablet (500 mg total) by mouth daily with breakfast. 02/12/21   Abby Potash, PA-C  metoprolol succinate (TOPROL-XL) 25 MG 24 hr tablet Take 1 tablet (25 mg total) by mouth daily. 11/28/20   Sueanne Margarita, MD  Multiple Vitamins-Minerals (MULTIVITAMIN) tablet Take 1 tablet by mouth daily. 05/27/16   Binnie Rail, MD  nitroGLYCERIN (NITROSTAT) 0.4 MG SL tablet PLACE 1 TABLET (0.4 MG TOTAL) UNDER THE TONGUE EVERY 5 (FIVE) MINUTES AS NEEDED FOR CHEST PAIN. 11/19/20   Sueanne Margarita, MD  polyvinyl alcohol (LIQUIFILM TEARS) 1.4 % ophthalmic solution Place 1 drop into both eyes as needed for dry eyes.    [provider]  tamsulosin (FLOMAX) 0.4 MG CAPS capsule Take 0.4 mg by mouth daily. 03/21/20   [provider]  telmisartan (MICARDIS) 80 MG tablet Take 1 tablet (80 mg total) by mouth daily. 11/14/20   Binnie Rail, MD  Vitamin D, Ergocalciferol, (DRISDOL) 1.25 MG (50000 UNIT) CAPS capsule Take 1 capsule (50,000 Units total) by mouth every 7 (seven) days. 02/26/21   Abby Potash, PA-C    Allergies:  No Known Allergies  Social History:  reports that he has  never smoked. He has never used smokeless tobacco. He reports current alcohol use. He reports that he does not use drugs.  Family History: Family History  Problem Relation Age of Onset  . Heart disease Father 60       CABG age 32  . Hypertension Father   . Diabetes Father   . Hyperlipidemia Father   . Cancer Father   . Dementia Mother        ?ALS vs SNP  . ALS Maternal Grandfather   .  Arthritis Other        Parent & grandparents  . Colon cancer Neg Hx      Objective   Physical Exam: Blood pressure (!) 143/82, pulse (!) 58, temperature 98.6 F (37 C), temperature source Oral, resp. rate 14, height 6' (1.829 m), weight 112.5 kg, SpO2 100 %.  Physical Exam Vitals and nursing note reviewed.  Constitutional:      Appearance: Normal appearance.  HENT:     Head: Normocephalic and atraumatic.  Eyes:     Conjunctiva/sclera: Conjunctivae normal.  Cardiovascular:     Rate and Rhythm: Normal rate and regular rhythm.  Pulmonary:     Effort: Pulmonary effort is normal.     Breath sounds: Normal breath sounds.  Abdominal:     General: Abdomen is flat.     Palpations: Abdomen is soft.  Musculoskeletal:     Comments: Limited exam due Limited ROM of right hip due to dislocation. Able to move toes and foot.  Skin:    Coloration: Skin is not jaundiced or pale.  Neurological:     Mental Status: He is alert. Mental status is at baseline.  Psychiatric:        Mood and Affect: Mood normal.        Behavior: Behavior normal.     LABS on Admission: I have personally reviewed all the labs and imaging below    Basic Metabolic Panel: Recent Labs  Lab 03/12/21 0854  NA 142  K 3.7  CL 110  CO2 23  GLUCOSE 132*  BUN 15  CREATININE 0.77  CALCIUM 9.5   Liver Function Tests: No results for input(s): AST, ALT, ALKPHOS, BILITOT, PROT, ALBUMIN in the last 168 hours. No results for input(s): LIPASE, AMYLASE in the last 168 hours. No results for input(s): AMMONIA in the last 168  hours. CBC: Recent Labs  Lab 03/12/21 0854  WBC 4.6  NEUTROABS 3.5  HGB 13.8  HCT 40.0  MCV 89.5  PLT 128*   Cardiac Enzymes: No results for input(s): CKTOTAL, CKMB, CKMBINDEX, TROPONINI in the last 168 hours. BNP: Invalid input(s): POCBNP CBG: No results for input(s): GLUCAP in the last 168 hours.  Radiological Exams on Admission:  DG Hip Port Unilat W or [place] Pelvis 1 View Right  Result Date: 03/12/2021 CLINICAL DATA:  Status post attempted reduction of dislocation of the patient's right hip arthroplasty. EXAM: DG HIP (WITH OR WITHOUT PELVIS) 1V PORT RIGHT COMPARISON:  Plain films right hip earlier today. FINDINGS: The patient's right hip arthroplasty remains dislocated. No new abnormality. IMPRESSION: Persistent dislocation of the right hip. Electronically Signed   By: Inge Rise M.D.   On: 03/12/2021 10:12   DG Hip Unilat  With Pelvis 2-3 Views Right  Result Date: 03/12/2021 CLINICAL DATA:  Right hip pain.  History of dislocation EXAM: DG HIP (WITH OR WITHOUT PELVIS) 2-3V RIGHT COMPARISON:  11/26/2020 FINDINGS: Posterior dislocation of the prosthetic right hip. Bilateral hip arthroplasty with stable lucency around the upper left femoral component. No bony fracture. Lower lumbar spine degeneration. IMPRESSION: Dislocated right hip prosthesis. Electronically Signed   By: Monte Fantasia M.D.   On: 03/12/2021 08:22      EKG: Not done   A & P   Principal Problem:   Hip dislocation, right (HCC) Active Problems:   Hypothyroidism   Essential hypertension   OA (osteoarthritis) of knee   CAD S/P percutaneous coronary angioplasty   CAD (coronary artery disease), native coronary artery   HLD (hyperlipidemia)  History of ST elevation myocardial infarction (STEMI)   Bradycardia   1. Right hip dislocation with history of right hip dislocation and bilateral hip/knee replacements a. Occurred after flexing hip this AM  b. Plan for OR this evening for hip  reduction c. Continue pain management d. NPO  2. Asymptomatic Bradycardia, at baseline a. Holding beta blocker  3. CAD  Hx of STEMI with DES a. Continue DAPT and statin when able to tolerate PO  4. Hypothyroid a. Continue levothyroxine when tolerating PO  5. Hypertension a. Continue telmisartan when tolerating PO    DVT prophylaxis: SCD   Code Status: Full Code  Diet: NPO Family Communication: Admission, patients condition and plan of care including tests being ordered have been discussed with the patient who indicates understanding and agrees with the plan and Code Status.  Disposition Plan: The appropriate patient status for this patient is INPATIENT. Inpatient status is judged to be reasonable and necessary in order to provide the required intensity of service to ensure the patient's safety. The patient's presenting symptoms, physical exam findings, and initial radiographic and laboratory data in the context of their chronic comorbidities is felt to place them at high risk for further clinical deterioration. Furthermore, it is not anticipated that the patient will be medically stable for discharge from the hospital within 2 midnights of admission. The following factors support the patient status of inpatient.   " The patient's presenting symptoms include right hip pain. " The worrisome physical exam findings include right hip pain. " The initial radiographic and laboratory data are worrisome because of hight hip dislocation. " The chronic co-morbidities include CAD, prior hip dislocation.   * I certify that at the point of admission it is my clinical judgment that the patient will require inpatient hospital care spanning beyond 2 midnights from the point of admission due to high intensity of service, high risk for further deterioration and high frequency of surveillance required.*   Consultants  . Orthopedic surgery  Procedures  . Unsuccessful conscious sedation hip reduction  in ED  Time Spent on Admission: 58 minutes    Harold Hedge, DO [facility]  03/12/2021, 2:53 PM

## 2021-03-12 NOTE — Transfer of Care (Signed)
Immediate Anesthesia Transfer of Care Note  Patient: [name]  Procedure(s) Performed: CLOSED REDUCTION HIP (Right Hip)  Patient Location: PACU  Anesthesia Type:General  Level of Consciousness: awake, alert , oriented and patient cooperative  Airway & Oxygen Therapy: Patient Spontanous Breathing  Post-op Assessment: Report given to RN and Post -op Vital signs reviewed and stable  Post vital signs: Reviewed and stable  Last Vitals:  Vitals Value Taken Time  BP 119/67 03/12/21 2130  Temp    Pulse 72 03/12/21 2135  Resp 17 03/12/21 2135  SpO2 95 % 03/12/21 2135  Vitals shown include unvalidated device data.  Last Pain:  Vitals:   03/12/21 2041  TempSrc:   PainSc: Asleep         Complications: No complications documented.

## 2021-03-12 NOTE — Op Note (Signed)
OPERATIVE REPORT  DATE OF SURGERY: 03/12/2021  PATIENT NAME:  [name] MRN: [id number] DOB: 1951/03/01  PCP: Binnie Rail, MD  PRE-OPERATIVE DIAGNOSIS: Right hip dislocation  POST-OPERATIVE DIAGNOSIS: Same  PROCEDURE:   Closed reduction of right prosthetic hip dislocation  SURGEON:  Melina Schools, MD  PHYSICIAN ASSISTANT: None  ANESTHESIA:   General  EBL: 0 ml   BRIEF HISTORY: [name] is a 70 y.o. male who had a postural indiscretion and felt a pop in his right groin.  He had a prior hip dislocation 6 months ago and this felt the same.  He is unable to ambulate and had significant groin pain.  Brought to the emergency room and x-rays confirmed a hip dislocation.  Patient had attempted closed reduction with conscious sedation that failed.  As result I elected taken to the operating room for close reduction under general anesthesia.  PROCEDURE DETAILS: Patient was brought into the operating room. After successful induction of general anesthesia and endotracheal intubation a Time Out was done. This confirmed all pertinent important data.  A gentle closed reduction maneuver was made and there was an audible reduction of the hip.  Knee immobilizer was applied and an intraoperative x-ray was taken.  The AP confirmed satisfactory reduction.  Due to his body habitus we could not get a good lateral.  However because I reestablished range of motion, and normal leg length in the AP demonstrating satisfactory reduction elected to extubate and moved to the PACU to get formal x-rays.  Patient was ultimately extubated transfer the PACU without incident.  Melina Schools, MD 03/12/2021 9:17 PM

## 2021-03-12 NOTE — Brief Op Note (Signed)
03/12/2021  9:20 PM  PATIENT:  [name]  70 y.o. male  PRE-OPERATIVE DIAGNOSIS:  Right Hip Dislocation.  POST-OPERATIVE DIAGNOSIS:  Right Hip Dislocation.  PROCEDURE:  Procedure(s): CLOSED REDUCTION HIP (Right)  SURGEON:  Surgeon(s) and Role:    Melina Schools, MD - Primary   PHYSICIAN ASSISTANT:   ASSISTANTS: none   ANESTHESIA:   general  EBL:  none   BLOOD ADMINISTERED:none  DRAINS: none   LOCAL MEDICATIONS USED:  NONE  SPECIMEN:  No Specimen  DISPOSITION OF SPECIMEN:  N/A  COUNTS:  YES  TOURNIQUET:  * No tourniquets in log *  DICTATION: .Dragon Dictation  PLAN OF CARE: Admit for overnight observation  PATIENT DISPOSITION:  PACU - hemodynamically stable.

## 2021-03-12 NOTE — Progress Notes (Signed)
Orthopedic Tech Progress Note Patient Details:  [name] 22-Oct-1951 [id number]  Patient ID: [name], male   DOB: 12/22/1951, 70 y.o.   MRN: [id number]   [name] 03/12/2021, 10:18 AMright hip reduction

## 2021-03-12 NOTE — Anesthesia Procedure Notes (Signed)
Procedure Name: LMA Insertion Date/Time: 03/12/2021 9:07 PM Performed by: Raenette Rover, CRNA Pre-anesthesia Checklist: Patient identified, Emergency Drugs available, Suction available and Patient being monitored Patient Re-evaluated:Patient Re-evaluated prior to induction Oxygen Delivery Method: Circle system utilized Preoxygenation: Pre-oxygenation with 100% oxygen Induction Type: IV induction LMA: LMA inserted LMA Size: 4.0 Number of attempts: 1 Placement Confirmation: positive ETCO2 and breath sounds checked- equal and bilateral Dental Injury: Teeth and Oropharynx as per pre-operative assessment

## 2021-03-12 NOTE — ED Triage Notes (Signed)
patient reports that he put his right leg on the toilet inorder to put lotion on his leg and felt his right hip "pop out." patient states this happened approx  6 months ago.

## 2021-03-12 NOTE — ED Provider Notes (Signed)
[facility] DEPT Provider Note   CSN: [id number] Arrival date & time: 03/12/21  0719     History Chief Complaint  Patient presents with  . Hip Pain    [name] is a 70 y.o. male history of back pain, CAD, MI, hypertension, hyperlipidemia, obesity, prediabetic, paroxysmal A. fib, OSA, hip replacement, knee replacement.  Patient reports this morning he was attempted but lotion on his leg when he lifted his right leg up and felt his right hip pop he reports that around 6 months ago he dislocated his right hip and this feels very similar today.  He describes severe aching pain of the right hip which is been constant worsened with movement improves with rest pain does not radiate.  Denies fall/injury, head injury, numbness/weakness, tingling, back pain, chest pain, abdominal pain or any additional concerns  HPI     Past Medical History:  Diagnosis Date  . Back pain   . BPH (benign prostatic hypertrophy)   . Chest pain   . COLONIC POLYPS, HX OF 2007   clear colo w/o polyps 04/2015: 71yr follow up  . Constipation   . Coronary artery disease   . Decreased mobility    due to BL knee and hip replacement  . Fatigue   . GERD (gastroesophageal reflux disease)   . Heartburn   . History of kidney stones    passed  . Hyperlipidemia   . HYPERTENSION   . HYPOTHYROIDISM   . Joint pain   . Myocardial infarction ([place]) 12/2018   Inferior STEMI  . OA (osteoarthritis)    Knees  . OBESITY   . OSA treated with BiPAP   . PAF (paroxysmal atrial fibrillation) ([place])   . Pre-diabetes   . Shortness of breath on exertion   . Umbilical hernia   . Ventral hernia     Patient Active Problem List   Diagnosis Date Noted  . Hepatic steatosis 01/03/2021  . Other fatigue 01/01/2021  . Shortness of breath on exertion 01/01/2021  . Elevated LFTs 01/01/2021  . History of ST elevation myocardial infarction (STEMI) 01/01/2021  . Vitamin D deficiency 01/01/2021  .  Failed total hip arthroplasty ([place]) 01/04/2020  . OSA treated with BiPAP 05/03/2019  . CAD S/P percutaneous coronary angioplasty 01/11/2019  . CAD (coronary artery disease), native coronary artery 01/11/2019  . HLD (hyperlipidemia) 01/11/2019  . Acute ST elevation myocardial infarction (STEMI) of inferior wall ([place]) 12/30/2018  . ST elevation myocardial infarction (STEMI) ([place]) 12/30/2018  . 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 ([place])   . Prediabetes 05/11/2017  . Ventral hernia without obstruction or gangrene 05/11/2017  . Umbilical hernia without obstruction or gangrene 05/11/2017  . OA (osteoarthritis) of hip 04/16/2016  . Pre-syncope 02/22/2016  . Vertigo 02/22/2016  . Knee joint replacement status 01/12/2013  . OA (osteoarthritis) of knee 12/27/2012  . Benign prostatic hyperplasia   . Hypothyroidism 09/04/2009  . Obesity 09/04/2009  . Essential hypertension 09/04/2009  . COLONIC POLYPS, HX OF 09/04/2009    Past Surgical History:  Procedure Laterality Date  . arthroscopic knee surgery     (R) 2003 & (L) 2010  . CARDIAC CATHETERIZATION    . COLONOSCOPY  04/2015   no polyps ([place])  . CORONARY/GRAFT ACUTE MI REVASCULARIZATION N/A 12/30/2018   Procedure: Coronary/Graft Acute MI Revascularization;  Surgeon: Belva Crome, MD;  Location: [facility];  Service: Cardiovascular;  Laterality: N/A;  . LAMINECTOMY  1991  . LEFT HEART CATH AND CORONARY ANGIOGRAPHY N/A 12/30/2018   Procedure: LEFT HEART CATH AND CORONARY ANGIOGRAPHY;  Surgeon: Belva Crome, MD;  Location: [facility];  Service: Cardiovascular;  Laterality: N/A;  . TONSILLECTOMY  1990   w/ adenoids and uvula  . TOTAL HIP ARTHROPLASTY Right 01/2011   [place]  . TOTAL HIP ARTHROPLASTY Left 04/16/2016   Procedure: TOTAL HIP ARTHROPLASTY ANTERIOR APPROACH;  Surgeon: Gaynelle Arabian, MD;  Location: [facility];  Service: Orthopedics;  Laterality: Left;  .  TOTAL HIP REVISION Right 01/04/2020   Procedure: Right hip bearing surface;  Surgeon: Gaynelle Arabian, MD;  Location: [facility];  Service: Orthopedics;  Laterality: Right;  138min  . TOTAL KNEE ARTHROPLASTY  12/27/2012   Procedure: TOTAL KNEE BILATERAL;  Surgeon: Gearlean Alf, MD;  Location: [facility];  Service: Orthopedics;  Laterality: Bilateral;       Family History  Problem Relation Age of Onset  . Heart disease Father 57       CABG age 64  . Hypertension Father   . Diabetes Father   . Hyperlipidemia Father   . Cancer Father   . Dementia Mother        ?ALS vs SNP  . ALS Maternal Grandfather   . Arthritis Other        Parent & grandparents  . Colon cancer Neg Hx     Social History   Tobacco Use  . Smoking status: Never Smoker  . Smokeless tobacco: Never Used  Vaping Use  . Vaping Use: Never used  Substance Use Topics  . Alcohol use: Yes    Alcohol/week: 0.0 standard drinks    Comment: rarely, social  . Drug use: No    Home Medications Prior to Admission medications   Medication Sig Start Date End Date Taking? Authorizing Provider  aspirin EC 81 MG tablet Take 81 mg by mouth daily.    [provider]  atorvastatin (LIPITOR) 80 MG tablet TAKE 1 TABLET (80 MG TOTAL) BY MOUTH DAILY AT 6 PM. 11/28/20   Turner, Eber Hong, MD  cholecalciferol (VITAMIN D3) 25 MCG (1000 UNIT) tablet Take 1,000 Units by mouth daily.    [provider]  clopidogrel (PLAVIX) 75 MG tablet Take 1 tablet (75 mg total) by mouth daily. 10/02/20   Sueanne Margarita, MD  diclofenac Sodium (VOLTAREN) 1 % GEL Apply 2 g topically 4 (four) times daily as needed (pain).    [provider]  levothyroxine (SYNTHROID) 150 MCG tablet TAKE 1 TABLET BY MOUTH EVERY DAY BEFORE BREAKFAST 02/11/21   Binnie Rail, MD  metFORMIN (GLUCOPHAGE) 500 MG tablet Take 1 tablet (500 mg total) by mouth daily with breakfast. 02/12/21   Abby Potash, PA-C  metoprolol succinate (TOPROL-XL) 25 MG 24 hr tablet Take  1 tablet (25 mg total) by mouth daily. 11/28/20   Sueanne Margarita, MD  Multiple Vitamins-Minerals (MULTIVITAMIN) tablet Take 1 tablet by mouth daily. 05/27/16   Binnie Rail, MD  nitroGLYCERIN (NITROSTAT) 0.4 MG SL tablet PLACE 1 TABLET (0.4 MG TOTAL) UNDER THE TONGUE EVERY 5 (FIVE) MINUTES AS NEEDED FOR CHEST PAIN. 11/19/20   Sueanne Margarita, MD  polyvinyl alcohol (LIQUIFILM TEARS) 1.4 % ophthalmic solution Place 1 drop into both eyes as needed for dry eyes.    [provider]  tamsulosin (FLOMAX) 0.4 MG CAPS capsule Take 0.4 mg by mouth daily. 03/21/20   [provider]  telmisartan (MICARDIS) 80 MG tablet Take  1 tablet (80 mg total) by mouth daily. 11/14/20   Binnie Rail, MD  Vitamin D, Ergocalciferol, (DRISDOL) 1.25 MG (50000 UNIT) CAPS capsule Take 1 capsule (50,000 Units total) by mouth every 7 (seven) days. 02/26/21   Abby Potash, PA-C    Allergies    Patient has no known allergies.  Review of Systems   Review of Systems Ten systems are reviewed and are negative for acute change except as noted in the HPI  Physical Exam Updated Vital Signs BP (!) 143/82 (BP Location: Right Arm)   Pulse (!) 58   Temp 98.6 F (37 C) (Oral)   Resp 14   Ht 6' (1.829 m)   Wt 112.5 kg   SpO2 100%   BMI 33.63 kg/m   Physical Exam Constitutional:      General: He is not in acute distress.    Appearance: Normal appearance. He is well-developed. He is not ill-appearing or diaphoretic.  HENT:     Head: Normocephalic and atraumatic.  Eyes:     General: Vision grossly intact. Gaze aligned appropriately.     Pupils: Pupils are equal, round, and reactive to light.  Neck:     Trachea: Trachea and phonation normal.  Pulmonary:     Effort: Pulmonary effort is normal. No respiratory distress.  Abdominal:     General: There is no distension.     Palpations: Abdomen is soft.     Tenderness: There is no abdominal tenderness. There is no guarding or rebound.  Musculoskeletal:         General: Normal range of motion.     Cervical back: Normal range of motion.     Comments: Shortening and internal rotation of the right leg.  Capillary refill sensation and pedal pulses intact.  Skin:    General: Skin is warm and dry.  Neurological:     Mental Status: He is alert.     GCS: GCS eye subscore is 4. GCS verbal subscore is 5. GCS motor subscore is 6.     Comments: Speech is clear and goal oriented, follows commands Major Cranial nerves without deficit, no facial droop Moves extremities without ataxia, coordination intact  Psychiatric:        Behavior: Behavior normal.     ED Results / Procedures / Treatments   Labs (all labs ordered are listed, but only abnormal results are displayed) Labs Reviewed  CBC WITH DIFFERENTIAL/PLATELET - Abnormal; Notable for the following components:      Result Value   Platelets 128 (*)    Lymphs Abs 0.5 (*)    All other components within normal limits  BASIC METABOLIC PANEL - Abnormal; Notable for the following components:   Glucose, Bld 132 (*)    All other components within normal limits  RESP PANEL BY RT-PCR (FLU A&B, COVID) ARPGX2  PSA    EKG None  Radiology DG Hip Port Unilat W or [place] Pelvis 1 View Right  Result Date: 03/12/2021 CLINICAL DATA:  Status post attempted reduction of dislocation of the patient's right hip arthroplasty. EXAM: DG HIP (WITH OR WITHOUT PELVIS) 1V PORT RIGHT COMPARISON:  Plain films right hip earlier today. FINDINGS: The patient's right hip arthroplasty remains dislocated. No new abnormality. IMPRESSION: Persistent dislocation of the right hip. Electronically Signed   By: Inge Rise M.D.   On: 03/12/2021 10:12   DG Hip Unilat  With Pelvis 2-3 Views Right  Result Date: 03/12/2021 CLINICAL DATA:  Right hip pain.  History of dislocation  EXAM: DG HIP (WITH OR WITHOUT PELVIS) 2-3V RIGHT COMPARISON:  11/26/2020 FINDINGS: Posterior dislocation of the prosthetic right hip. Bilateral hip arthroplasty with  stable lucency around the upper left femoral component. No bony fracture. Lower lumbar spine degeneration. IMPRESSION: Dislocated right hip prosthesis. Electronically Signed   By: Monte Fantasia M.D.   On: 03/12/2021 08:22    Procedures .Ortho Injury Treatment  Date/Time: 03/12/2021 9:59 AM Performed by: Deliah Boston, PA-C Authorized by: Deliah Boston, PA-C   Consent:    Consent obtained:  Verbal   Consent given by:  Patient and spouse   Risks discussed:  Irreducible dislocation, fracture, nerve damage, recurrent dislocation, restricted joint movement, vascular damage and stiffnessInjury location: hip Location details: right hip Injury type: dislocation Dislocation type: posterior Spontaneous dislocation: yes Prosthesis: yes Pre-procedure neurovascular assessment: neurovascularly intact Pre-procedure distal perfusion: normal Pre-procedure neurological function: normal Pre-procedure range of motion: reduced  Anesthesia: Local anesthesia used: no  Patient sedated: Yes. Refer to sedation procedure documentation for details of sedation. Manipulation performed: yes Reduction method: Allis maneuver Reduction successful: no Post-procedure neurovascular assessment: post-procedure neurovascularly intact Post-procedure distal perfusion: normal Post-procedure neurological function: normal Post-procedure range of motion: unchanged Comments: Sedation and supervision by Dr. Roderic Palau.      Medications Ordered in ED Medications  propofol (DIPRIVAN) 10 mg/mL bolus/IV push 112.5 mg (160 mg Intravenous See Procedure Record 03/12/21 0952)  morphine 4 MG/ML injection 4 mg (has no administration in time range)  fentaNYL (SUBLIMAZE) injection 50 mcg (50 mcg Intravenous Given 03/12/21 0904)  propofol (DIPRIVAN) 10 mg/mL bolus/IV push (40 mg Intravenous Given 03/12/21 0942)  HYDROmorphone (DILAUDID) injection 1 mg (1 mg Intravenous Given 03/12/21 1032)  morphine 4 MG/ML injection 4 mg (4  mg Intravenous Given 03/12/21 1218)    ED Course  I have reviewed the triage vital signs and the nursing notes.  Pertinent labs & imaging results that were available during my care of the patient were reviewed by me and considered in my medical decision making (see chart for details).  Clinical Course as of 03/12/21 1419  Tue Mar 12, 2021  [address] [facility] [BM]  [address] [BM]    Clinical Course User Index [BM] Gari Crown   MDM Rules/Calculators/A&P                         Additional history obtained from: 1. Nursing notes from this visit. 2. Patient's wife at bedside. 3. Electronic medical records. ----------------------- 70 year old male presented with right hip dislocation after lifting his leg to apply lotion today.  He is shortening and internal rotation of the right leg neurovascularly intact.  No falls or other injuries.  X-ray confirmed dislocation.  Risks versus benefits of conscious sedation and attempt at reduction were discussed with patient and his wife they both state understanding and wished to proceed.  Patient was sedated with propofol and Allis maneuver was used to attempt reduction.  Unfortunately this was unsuccessful, follow-up x-ray showed continued posterior dislocation.  Consult was then called to on-call orthopedist Dr. Rolena Infante at 10:06 AM.  I spoke with their nurse as Dr. Rolena Infante was in the OR and they will call back as soon as they are available.  I have added on basic labs including CBC, BMP and Covid test in case they take patient to the OR.  Patient was supposed to be at urologist office today for PSA screening lab, I have added this test on for the patient. -  10:06 PM: Received call from [place] with Dr. Rolena Infante who is currently in a procedure.  Advises they will call back once available. - Patient reassessed multiple times remains neurovascular intact.  Additional pain medication provided. - 1:32 PM: Consult with orthopedic Amanda PA-C.  Advises  she is going to speak with Dr. Rolena Infante again regarding patient's care and call back with further recommendations - 2:05 PM: Consult with orthopedic [facility].  Advises Dr. Rolena Infante plans to take this patient to the OR around 6:30 PM today.  They have asked for medicine admission and anticipate the patient staying overnight. - Patient reassessed, he is resting comfortably in bed, wife at bedside.  They state understanding of plan of care and are agreeable.  Pain under control with morphine.  Consult placed to medicine service.  Covid/influenza panel negative.  CBC shows no leukocytosis or anemia.  Mild thrombocytopenia of 128.  BMP shows no emergent electrolyte derangement, AKI or gap, hyperglycemia 132. - 2:18 PM: Consult with Dr. Neysa Bonito, patient accepted to medicine service.  Note: Portions of this report may have been transcribed using voice recognition software. Every effort was made to ensure accuracy; however, inadvertent computerized transcription errors may still be present. Final Clinical Impression(s) / ED Diagnoses Final diagnoses:  Posterior dislocation of right hip, initial encounter [facility])    Rx / DC Orders ED Discharge Orders    None       Gari Crown 03/12/21 1419    Milton Ferguson, MD 03/16/21 1541

## 2021-03-12 NOTE — H&P (Signed)
Chief Complaint:  History: [name] is a 70 y.o. male history of back pain, CAD, MI, hypertension, hyperlipidemia, obesity, prediabetic, paroxysmal A. fib, OSA, hip replacement, knee replacement.  Patient reports this morning he was attempted but lotion on his leg when he lifted his right leg up and felt his right hip pop he reports that around 6 months ago he dislocated his right hip and this feels very similar today.  He describes severe aching pain of the right hip which is been constant worsened with movement improves with rest pain does not radiate.  Denies fall/injury, head injury, numbness/weakness, tingling, back pain, chest pain, abdominal pain or any additional concerns  Review of systems: Patient denies loss of consciousness, nausea, vomiting, incontinence of bowel or bladder.  Past Medical History:  Diagnosis Date  . Back pain   . BPH (benign prostatic hypertrophy)   . Chest pain   . COLONIC POLYPS, HX OF 2007   clear colo w/o polyps 04/2015: 44yr follow up  . Constipation   . Coronary artery disease   . Decreased mobility    due to BL knee and hip replacement  . Fatigue   . GERD (gastroesophageal reflux disease)   . Heartburn   . History of kidney stones    passed  . Hyperlipidemia   . HYPERTENSION   . HYPOTHYROIDISM   . Joint pain   . Myocardial infarction ([place]) 12/2018   Inferior STEMI  . OA (osteoarthritis)    Knees  . OBESITY   . OSA treated with BiPAP   . PAF (paroxysmal atrial fibrillation) ([place])   . Pre-diabetes   . Shortness of breath on exertion   . Umbilical hernia   . Ventral hernia     No Known Allergies  No current facility-administered medications on file prior to encounter.   Current Outpatient Medications on File Prior to Encounter  Medication Sig Dispense Refill  . aspirin EC 81 MG tablet Take 81 mg by mouth daily.    Marland Kitchen atorvastatin (LIPITOR) 80 MG tablet TAKE 1 TABLET (80 MG TOTAL) BY MOUTH DAILY AT 6 PM. 90 tablet 3  .  cholecalciferol (VITAMIN D3) 25 MCG (1000 UNIT) tablet Take 1,000 Units by mouth daily.    . clopidogrel (PLAVIX) 75 MG tablet Take 1 tablet (75 mg total) by mouth daily. 90 tablet 1  . diclofenac Sodium (VOLTAREN) 1 % GEL Apply 2 g topically 4 (four) times daily as needed (pain).    Marland Kitchen levothyroxine (SYNTHROID) 150 MCG tablet TAKE 1 TABLET BY MOUTH EVERY DAY BEFORE BREAKFAST (Patient taking differently: Take 150 mcg by mouth daily before breakfast.) 60 tablet 0  . metFORMIN (GLUCOPHAGE) 500 MG tablet Take 1 tablet (500 mg total) by mouth daily with breakfast. 90 tablet 0  . metoprolol succinate (TOPROL-XL) 25 MG 24 hr tablet Take 1 tablet (25 mg total) by mouth daily. 90 tablet 3  . nitroGLYCERIN (NITROSTAT) 0.4 MG SL tablet PLACE 1 TABLET (0.4 MG TOTAL) UNDER THE TONGUE EVERY 5 (FIVE) MINUTES AS NEEDED FOR CHEST PAIN. 25 tablet 3  . polyvinyl alcohol (LIQUIFILM TEARS) 1.4 % ophthalmic solution Place 1 drop into both eyes daily as needed for dry eyes.    . tamsulosin (FLOMAX) 0.4 MG CAPS capsule Take 0.4 mg by mouth daily.    Marland Kitchen telmisartan (MICARDIS) 80 MG tablet Take 1 tablet (80 mg total) by mouth daily. 90 tablet 1  . Vitamin D, Ergocalciferol, (DRISDOL) 1.25 MG (50000 UNIT) CAPS capsule  Take 1 capsule (50,000 Units total) by mouth every 7 (seven) days. (Patient taking differently: Take 50,000 Units by mouth every 7 (seven) days. Mondays) 4 capsule 0  . Multiple Vitamins-Minerals (MULTIVITAMIN) tablet Take 1 tablet by mouth daily. (Patient not taking: Reported on 03/12/2021)      Physical Exam: Vitals:   03/12/21 1730 03/12/21 1812  BP: 134/79 129/73  Pulse: (!) 46 (!) 52  Resp: 20   Temp:  98.4 F (36.9 C)  SpO2: 99% 98%   Body mass index is 35.61 kg/m.  Alert and oriented x3 No shortness of breath or chest pain.  Lungs are clear to auscultation, regular rate and rhythm no rubs gallops murmurs. Abdomen is soft and nontender.  No loss of bowel bladder control, no rebound  tenderness Full range of motion the upper extremity bilaterally.  No gross crepitus deformity or pain. Right lower extremity: Positive groin pain and unable to ambulate or weight-bear on the extremity.  No knee or ankle pain with isolated joint range of motion. Left lower extremity: Asymptomatic.  Full range of motion of the hip, knee, ankle. 2+ dorsalis pedis/posterior tibialis pulses bilaterally.  Compartments are soft and nontender. Intact sensation light touch throughout the lower extremity.  5/5 motor strength the EHL/tibialis anterior/gastrocnemius. Image: DG Hip [place] W or [place] Pelvis 1 View Right  Result Date: 03/12/2021 CLINICAL DATA:  Status post attempted reduction of dislocation of the patient's right hip arthroplasty. EXAM: DG HIP (WITH OR WITHOUT PELVIS) 1V PORT RIGHT COMPARISON:  Plain films right hip earlier today. FINDINGS: The patient's right hip arthroplasty remains dislocated. No new abnormality. IMPRESSION: Persistent dislocation of the right hip. Electronically Signed   By: Inge Rise M.D.   On: 03/12/2021 10:12   DG Hip Unilat  With Pelvis 2-3 Views Right  Result Date: 03/12/2021 CLINICAL DATA:  Right hip pain.  History of dislocation EXAM: DG HIP (WITH OR WITHOUT PELVIS) 2-3V RIGHT COMPARISON:  11/26/2020 FINDINGS: Posterior dislocation of the prosthetic right hip. Bilateral hip arthroplasty with stable lucency around the upper left femoral component. No bony fracture. Lower lumbar spine degeneration. IMPRESSION: Dislocated right hip prosthesis. Electronically Signed   By: Monte Fantasia M.D.   On: 03/12/2021 08:22    A/P: [name] is a very pleasant 70 year old gentleman who had a postural indiscretion today and felt a pop.  He presented to the emergency room with inability to ambulate and significant right groin pain.  Imaging studies demonstrated a superior hip dislocation.  She was given conscious sedation and attempt to close reduction were made in the  emergency room.  These were unsuccessful.  As result Ortho consultation was requested.  At this point time because of the inability to obtain a reduction we elected take the patient to the operating room for general anesthesia.  The improved sedation would allow for easier reduction.  I have gone over the risks, benefits, alternatives of surgery with the patient.  Surgical plan is a closed reduction of the right hip.  If we are unable to get it reduced I will contact Dr. Wynelle Link to arrange an open reduction of the hip.  All of his questions were encouraged and addressed.

## 2021-03-12 NOTE — Anesthesia Preprocedure Evaluation (Addendum)
Anesthesia Evaluation  Patient identified by MRN, date of birth, ID band Patient awake    Reviewed: Allergy & Precautions, NPO status , Patient's Chart, lab work & pertinent test results, reviewed documented beta blocker date and time   History of Anesthesia Complications Negative for: history of anesthetic complications  Airway Mallampati: II  TM Distance: >3 FB Neck ROM: Full    Dental no notable dental hx.    Pulmonary sleep apnea and Continuous Positive Airway Pressure Ventilation ,    Pulmonary exam normal        Cardiovascular hypertension, Pt. on home beta blockers and Pt. on medications + CAD and + Past MI (on Plavix)  Normal cardiovascular exam+ dysrhythmias Atrial Fibrillation      Neuro/Psych negative neurological ROS  negative psych ROS   GI/Hepatic Neg liver ROS, GERD  Controlled,  Endo/Other  Hypothyroidism   Renal/GU negative Renal ROS  negative genitourinary   Musculoskeletal  (+) Arthritis ,   Abdominal   Peds  Hematology Plts 128k   Anesthesia Other Findings Day of surgery medications reviewed with patient.  Reproductive/Obstetrics negative OB ROS                            Anesthesia Physical Anesthesia Plan  ASA: II  Anesthesia Plan: General   Post-op Pain Management:    Induction: Intravenous  PONV Risk Score and Plan: 2 and Treatment may vary due to age or medical condition, Propofol infusion, Ondansetron and Midazolam  Airway Management Planned: LMA  Additional Equipment: None  Intra-op Plan:   Post-operative Plan: Extubation in OR  Informed Consent: I have reviewed the patients History and Physical, chart, labs and discussed the procedure including the risks, benefits and alternatives for the proposed anesthesia with the patient or authorized representative who has indicated his/her understanding and acceptance.     Dental advisory given  Plan  Discussed with: CRNA  Anesthesia Plan Comments:        Anesthesia Quick Evaluation

## 2021-03-13 ENCOUNTER — Ambulatory Visit (INDEPENDENT_AMBULATORY_CARE_PROVIDER_SITE_OTHER): Payer: Medicare Other | Admitting: Physician Assistant

## 2021-03-13 ENCOUNTER — Encounter (HOSPITAL_COMMUNITY): Payer: Self-pay | Admitting: Orthopedic Surgery

## 2021-03-13 DIAGNOSIS — T84020A Dislocation of internal right hip prosthesis, initial encounter: Secondary | ICD-10-CM | POA: Diagnosis not present

## 2021-03-13 LAB — CBC
HCT: 39.3 % (ref 39.0–52.0)
Hemoglobin: 13.4 g/dL (ref 13.0–17.0)
MCH: 31.4 pg (ref 26.0–34.0)
MCHC: 34.1 g/dL (ref 30.0–36.0)
MCV: 92 fL (ref 80.0–100.0)
Platelets: 126 10*3/uL — ABNORMAL LOW (ref 150–400)
RBC: 4.27 MIL/uL (ref 4.22–5.81)
RDW: 12.9 % (ref 11.5–15.5)
WBC: 5.3 10*3/uL (ref 4.0–10.5)
nRBC: 0 % (ref 0.0–0.2)

## 2021-03-13 LAB — BASIC METABOLIC PANEL
Anion gap: 8 (ref 5–15)
BUN: 12 mg/dL (ref 8–23)
CO2: 25 mmol/L (ref 22–32)
Calcium: 9 mg/dL (ref 8.9–10.3)
Chloride: 108 mmol/L (ref 98–111)
Creatinine, Ser: 0.91 mg/dL (ref 0.61–1.24)
GFR, Estimated: >60 mL/min (ref >60)
Glucose, Bld: 107 mg/dL — ABNORMAL HIGH (ref 70–99)
Potassium: 3.7 mmol/L (ref 3.5–5.1)
Sodium: 141 mmol/L (ref 135–145)

## 2021-03-13 LAB — HEMOGLOBIN A1C
Hgb A1c MFr Bld: 5.3 % (ref 4.8–5.6)
Mean Plasma Glucose: 105.41 mg/dL

## 2021-03-13 LAB — GLUCOSE, CAPILLARY: Glucose-Capillary: 103 mg/dL — ABNORMAL HIGH (ref 70–99)

## 2021-03-13 NOTE — Discharge Instructions (Signed)
Dr. Gaynelle Arabian Total Joint Specialist [facility] [address], [place], [place] [address] [phone]  PRECAUTIONS ([place]) . Do not bend your hip past a 90 degree angle . Do not cross your legs. . Don't twist your hip inwards- keep knees and toes pointed upwards   HOME CARE INSTRUCTIONS  . Remove items at home which could result in a fall. This includes throw rugs or furniture in walking pathways.   ICE to the affected hip every three hours for 30 minutes at a time and then as needed for pain and swelling.  Continue to use ice on the hip for pain and swelling from surgery. You may notice swelling that will progress down to the foot and ankle.  This is normal after surgery.  Elevate the leg when you are not up walking on it.    DIET You may resume your previous home diet once your are discharged from the hospital.  ACTIVITY Use knee immobilizer as instructed.  WEIGHT BEARING Weight bearing as tolerated with assist device (walker, cane, etc) as directed, use it as long as suggested by your surgeon or therapist, typically at least 4-6 weeks.  POSTOPERATIVE CONSTIPATION PROTOCOL Constipation - defined medically as fewer than three stools per week and severe constipation as less than one stool per week.  One of the most common issues patients have following surgery is constipation.  Even if you have a regular bowel pattern at home, your normal regimen is likely to be disrupted due to multiple reasons following surgery.  Combination of anesthesia, postoperative narcotics, change in appetite and fluid intake all can affect your bowels.  In order to avoid complications following surgery, here are some recommendations in order to help you during your recovery period.  Colace (docusate) - Pick up an over-the-counter form of Colace or another stool softener and take twice a day as long as you are requiring postoperative pain medications.  Take with a  full glass of water daily.  If you experience loose stools or diarrhea, hold the colace until you stool forms back up.  If your symptoms do not get better within 1 week or if they get worse, check with your doctor.  Dulcolax (bisacodyl) - Pick up over-the-counter and take as directed by the product packaging as needed to assist with the movement of your bowels.  Take with a full glass of water.  Use this product as needed if not relieved by Colace only.   MiraLax (polyethylene glycol) - Pick up over-the-counter to have on hand.  MiraLax is a solution that will increase the amount of water in your bowels to assist with bowel movements.  Take as directed and can mix with a glass of water, juice, soda, coffee, or tea.  Take if you go more than two days without a movement. Do not use MiraLax more than once per day. Call your doctor if you are still constipated or irregular after using this medication for 7 days in a row.  If you continue to have problems with postoperative constipation, please contact the office for further assistance and recommendations.  If you experience "the worst abdominal pain ever" or develop nausea or vomiting, please contact the office immediatly for further recommendations for treatment.  MEDICATIONS See your medication summary on the "After Visit Summary" that the nursing staff will review with you prior to discharge.  You may have some home medications which will be placed on hold until you complete the  course of blood thinner medication.  It is important for you to complete the blood thinner medication as prescribed by your surgeon.  Continue your approved medications as instructed at time of discharge.  PRECAUTIONS If you experience chest pain or shortness of breath - call 911 immediately for transfer to the hospital emergency department.                                                  FOLLOW-UP APPOINTMENTS Make sure you keep all of your appointments after your operation  with your surgeon and caregivers. You should call the office at the above phone number and make an appointment for approximately two weeks after the date of your surgery or on the date instructed by your surgeon outlined in the "After Visit Summary".

## 2021-03-13 NOTE — Discharge Summary (Signed)
Physician Discharge Summary   Patient ID: OTIS PORTAL MRN: [id number] DOB/AGE: [birth year] 70 y.o.  Admit date: 03/12/2021 Discharge date: 03/13/2021  Primary Diagnosis: Closed dislocation, right total hip  Admission Diagnoses:  Past Medical History:  Diagnosis Date  . Back pain   . BPH (benign prostatic hypertrophy)   . Chest pain   . COLONIC POLYPS, HX OF 2007   clear colo w/o polyps 04/2015: 85yr follow up  . Constipation   . Coronary artery disease   . Decreased mobility    due to BL knee and hip replacement  . Fatigue   . GERD (gastroesophageal reflux disease)   . Heartburn   . History of kidney stones    passed  . Hyperlipidemia   . HYPERTENSION   . HYPOTHYROIDISM   . Joint pain   . Myocardial infarction ([place]) 12/2018   Inferior STEMI  . OA (osteoarthritis)    Knees  . OBESITY   . OSA treated with BiPAP   . PAF (paroxysmal atrial fibrillation) ([place])   . Pre-diabetes   . Shortness of breath on exertion   . Umbilical hernia   . Ventral hernia    Discharge Diagnoses:   Principal Problem:   Hip dislocation, right (HCC) Active Problems:   Hypothyroidism   Essential hypertension   OA (osteoarthritis) of knee   CAD S/P percutaneous coronary angioplasty   CAD (coronary artery disease), native coronary artery   HLD (hyperlipidemia)   History of ST elevation myocardial infarction (STEMI)   Bradycardia  Estimated body mass index is 35.61 kg/m as calculated from the following:   Height as of this encounter: 6' (1.829 m).   Weight as of this encounter: 119.1 kg.  Procedure:  Procedure(s) (LRB): CLOSED REDUCTION HIP (Right)   Consults: None  HPI: [name] is a 70 y.o. male who had a postural indiscretion and felt a pop in his right groin.  He had a prior hip dislocation 6 months ago and this felt the same.  He is unable to ambulate and had significant groin pain.  Brought to the emergency room and x-rays confirmed a hip dislocation.  Patient had  attempted closed reduction with conscious sedation that failed.  As result I elected taken to the operating room for close reduction under general anesthesia.  Laboratory Data: Admission on 03/12/2021, Discharged on 03/13/2021  Component Date Value Ref Range Status  . WBC 03/12/2021 4.6  4.0 - 10.5 K/uL Final  . RBC 03/12/2021 4.47  4.22 - 5.81 MIL/uL Final  . Hemoglobin 03/12/2021 13.8  13.0 - 17.0 g/dL Final  . HCT 03/12/2021 40.0  39.0 - 52.0 % Final  . MCV 03/12/2021 89.5  80.0 - 100.0 fL Final  . MCH 03/12/2021 30.9  26.0 - 34.0 pg Final  . MCHC 03/12/2021 34.5  30.0 - 36.0 g/dL Final  . RDW 03/12/2021 12.6  11.5 - 15.5 % Final  . Platelets 03/12/2021 128* 150 - 400 K/uL Final  . nRBC 03/12/2021 0.0  0.0 - 0.2 % Final  . Neutrophils Relative % 03/12/2021 76  % Final  . Neutro Abs 03/12/2021 3.5  1.7 - 7.7 K/uL Final  . Lymphocytes Relative 03/12/2021 12  % Final  . Lymphs Abs 03/12/2021 0.5* 0.7 - 4.0 K/uL Final  . Monocytes Relative 03/12/2021 10  % Final  . Monocytes Absolute 03/12/2021 0.5  0.1 - 1.0 K/uL Final  . Eosinophils Relative 03/12/2021 2  % Final  . Eosinophils Absolute 03/12/2021 0.1  0.0 -  0.5 K/uL Final  . Basophils Relative 03/12/2021 0  % Final  . Basophils Absolute 03/12/2021 0.0  0.0 - 0.1 K/uL Final  . Immature Granulocytes 03/12/2021 0  % Final  . Abs Immature Granulocytes 03/12/2021 0.02  0.00 - 0.07 K/uL Final   Performed at [facility], [place] [address] [place], [place] [address]  . Sodium 03/12/2021 142  135 - 145 mmol/L Final  . Potassium 03/12/2021 3.7  3.5 - 5.1 mmol/L Final  . Chloride 03/12/2021 110  98 - 111 mmol/L Final  . CO2 03/12/2021 23  22 - 32 mmol/L Final  . Glucose, Bld 03/12/2021 132* 70 - 99 mg/dL Final   Glucose reference range applies only to samples taken after fasting for at least 8 hours.  . BUN 03/12/2021 15  8 - 23 mg/dL Final  . Creatinine, Ser 03/12/2021 0.77  0.61 - 1.24 mg/dL Final  . Calcium 03/12/2021  9.5  8.9 - 10.3 mg/dL Final  . GFR, Estimated 03/12/2021 >60  >60 mL/min Final   Comment: (NOTE) Calculated using the CKD-EPI Creatinine Equation (2021)   . Anion gap 03/12/2021 9  5 - 15 Final   Performed at [facility], [place] [address], [place], [place] [address]  . SARS Coronavirus 2 by RT PCR 03/12/2021 NEGATIVE  NEGATIVE Final   Comment: (NOTE) SARS-CoV-2 target nucleic acids are NOT DETECTED.  The SARS-CoV-2 RNA is generally detectable in upper respiratory specimens during the acute phase of infection. The lowest concentration of SARS-CoV-2 viral copies this assay can detect is 138 copies/mL. A negative result does not preclude SARS-Cov-2 infection and should not be used as the sole basis for treatment or other patient management decisions. A negative result may occur with  improper specimen collection/handling, submission of specimen other than nasopharyngeal swab, presence of viral mutation(s) within the areas targeted by this assay, and inadequate number of viral copies(<138 copies/mL). A negative result must be combined with clinical observations, patient history, and epidemiological information. The expected result is Negative.  Fact Sheet for Patients:  EntrepreneurPulse.com.au  Fact Sheet for Healthcare Providers:  IncredibleEmployment.be  This test is no                          t yet approved or cleared by the [place] FDA and  has been authorized for detection and/or diagnosis of SARS-CoV-2 by FDA under an Emergency Use Authorization (EUA). This EUA will remain  in effect (meaning this test can be used) for the duration of the COVID-19 declaration under Section 564(b)(1) of the Act, 21 U.S.C.section 360bbb-3(b)(1), unless the authorization is terminated  or revoked sooner.      . Influenza A by PCR 03/12/2021 NEGATIVE  NEGATIVE Final  . Influenza B by PCR 03/12/2021 NEGATIVE  NEGATIVE Final    Comment: (NOTE) The Xpert Xpress SARS-CoV-2/FLU/RSV plus assay is intended as an aid in the diagnosis of influenza from Nasopharyngeal swab specimens and should not be used as a sole basis for treatment. Nasal washings and aspirates are unacceptable for Xpert Xpress SARS-CoV-2/FLU/RSV testing.  Fact Sheet for Patients: EntrepreneurPulse.com.au  Fact Sheet for Healthcare Providers: IncredibleEmployment.be  This test is not yet approved or cleared by the [place] FDA and has been authorized for detection and/or diagnosis of SARS-CoV-2 by FDA under an Emergency Use Authorization (EUA). This EUA will remain in effect (meaning this test can be used) for the duration of the COVID-19 declaration under Section  564(b)(1) of the Act, 21 U.S.C. section 360bbb-3(b)(1), unless the authorization is terminated or revoked.  Performed at [facility], [place] [address] [place], [place] [address]   . Prostatic Specific Antigen 03/12/2021 10.67* 0.00 - 4.00 ng/mL Final   Comment: (NOTE) While PSA levels of <=4.0 ng/ml are reported as reference range, some men with levels below 4.0 ng/ml can have prostate cancer and many men with PSA above 4.0 ng/ml do not have prostate cancer.  Other tests such as free PSA, age specific reference ranges, PSA velocity and PSA doubling time may be helpful especially in men less than 71 years old. Performed at [facility], [place] [address], [place], [place] [address]   . HIV Screen 4th Generation wRfx 03/12/2021 Non Reactive  Non Reactive Final   Performed at [facility], [place] [address], [place], [place] [address]  . Sodium 03/13/2021 141  135 - 145 mmol/L Final  . Potassium 03/13/2021 3.7  3.5 - 5.1 mmol/L Final  . Chloride 03/13/2021 108  98 - 111 mmol/L Final  . CO2 03/13/2021 25  22 - 32 mmol/L Final  . Glucose, Bld 03/13/2021 107* 70 - 99 mg/dL Final   Glucose reference range  applies only to samples taken after fasting for at least 8 hours.  . BUN 03/13/2021 12  8 - 23 mg/dL Final  . Creatinine, Ser 03/13/2021 0.91  0.61 - 1.24 mg/dL Final  . Calcium 03/13/2021 9.0  8.9 - 10.3 mg/dL Final  . GFR, Estimated 03/13/2021 >60  >60 mL/min Final   Comment: (NOTE) Calculated using the CKD-EPI Creatinine Equation (2021)   . Anion gap 03/13/2021 8  5 - 15 Final   Performed at [facility], [place] [address] [place], [place] [address]  . WBC 03/13/2021 5.3  4.0 - 10.5 K/uL Final  . RBC 03/13/2021 4.27  4.22 - 5.81 MIL/uL Final  . Hemoglobin 03/13/2021 13.4  13.0 - 17.0 g/dL Final  . HCT 03/13/2021 39.3  39.0 - 52.0 % Final  . MCV 03/13/2021 92.0  80.0 - 100.0 fL Final  . MCH 03/13/2021 31.4  26.0 - 34.0 pg Final  . MCHC 03/13/2021 34.1  30.0 - 36.0 g/dL Final  . RDW 03/13/2021 12.9  11.5 - 15.5 % Final  . Platelets 03/13/2021 126* 150 - 400 K/uL Final   Comment: SPECIMEN CHECKED FOR CLOTS Immature Platelet Fraction may be clinically indicated, consider ordering this additional test [id number] CONSISTENT WITH PREVIOUS RESULT   . nRBC 03/13/2021 0.0  0.0 - 0.2 % Final   Performed at [facility] [address], [place], [place] [address]  . Hgb A1c MFr Bld 03/12/2021 5.3  4.8 - 5.6 % Final   Comment: (NOTE) Pre diabetes:          5.7%-6.4%  Diabetes:              >6.4%  Glycemic control for   <7.0% adults with diabetes   . Mean Plasma Glucose 03/12/2021 105.41  mg/dL Final   Performed at [facility] [address], [place],  [address]  . Glucose-Capillary 03/12/2021 91  70 - 99 mg/dL Final   Glucose reference range applies only to samples taken after fasting for at least 8 hours.  . Glucose-Capillary 03/13/2021 103* 70 - 99 mg/dL Final   Glucose reference range applies only to samples taken after fasting for at least 8 hours.     X-Rays:DG Hip Port  Unilat W or Wo Pelvis 1 View Right  Result Date:  03/12/2021 CLINICAL DATA:  Status post attempted reduction of dislocation of the patient's right hip arthroplasty. EXAM: DG HIP (WITH OR WITHOUT PELVIS) 1V PORT RIGHT COMPARISON:  Plain films right hip earlier today. FINDINGS: The patient's right hip arthroplasty remains dislocated. No new abnormality. IMPRESSION: Persistent dislocation of the right hip. Electronically Signed   By: Inge Rise M.D.   On: 03/12/2021 10:12   DG HIP UNILAT WITH PELVIS 2-3 VIEWS RIGHT  Result Date: 03/12/2021 CLINICAL DATA:  Close reduction of right hip arthroplasty dislocation EXAM: DG HIP (WITH OR WITHOUT PELVIS) 2-3V RIGHT COMPARISON:  03/12/2021 at 9:11 p.m. FINDINGS: Frontal view of the pelvis as well as frontal and cross-table lateral views of the right hip are obtained. There is anatomic alignment of the bilateral hip arthroplasties with no evidence of dislocation. No acute fracture. Remainder of the bony pelvis is unremarkable. Soft tissues are grossly normal. IMPRESSION: 1. Anatomic alignment of bilateral hip arthroplasties. No acute fracture. Electronically Signed   By: Randa Ngo M.D.   On: 03/12/2021 22:40   DG HIP UNILAT WITH PELVIS 2-3 VIEWS RIGHT  Result Date: 03/12/2021 CLINICAL DATA:  Closed reduction of right hip EXAM: DG HIP (WITH OR WITHOUT PELVIS) 2-3V RIGHT COMPARISON:  03/12/2021 FINDINGS: Status post right hip replacement. Reduction of previously noted femoral component dislocation, now with normal alignment. No fracture is seen. IMPRESSION: Right hip replacement with reduction of dislocated femoral component. Electronically Signed   By: Donavan Foil M.D.   On: 03/12/2021 21:49   DG Hip Unilat  With Pelvis 2-3 Views Right  Result Date: 03/12/2021 CLINICAL DATA:  Right hip pain.  History of dislocation EXAM: DG HIP (WITH OR WITHOUT PELVIS) 2-3V RIGHT COMPARISON:  11/26/2020 FINDINGS: Posterior dislocation of the prosthetic right hip. Bilateral hip arthroplasty with stable lucency around the  upper left femoral component. No bony fracture. Lower lumbar spine degeneration. IMPRESSION: Dislocated right hip prosthesis. Electronically Signed   By: Monte Fantasia M.D.   On: 03/12/2021 08:22    EKG: Orders placed or performed in visit on 01/01/21  . EKG 12-Lead     Hospital Course: [name] is a 70 y.o. who was admitted to [facility]. They were brought to the operating room on 03/12/2021 and underwent Procedure(s): [place].  Patient tolerated the procedure well and was later transferred to the recovery room and then to the orthopaedic floor for postoperative care. They were given PO and IV analgesics for pain control following their surgery.  PT and OT were ordered for total joint protocol. Discharge planning consulted to help with postop disposition and equipment needs.  Patient had a good night on the evening of surgery. Pt was seen during rounds and was feeling well with no pain. Instructed to use the knee immobilizer and abide by the posterior hip precautions until his first follow-up appointment.   Diet: Regular diet Activity: WBAT Follow-up: in 2 weeks Disposition: Home Discharged Condition: stable   Discharge Instructions    Call MD / Call 911   Complete by: As directed    If you experience chest pain or shortness of breath, CALL 911 and be transported to the hospital emergency room.  If you develope a fever above 101 F, pus (white drainage) or increased drainage or redness at the wound, or calf pain, call your surgeon's office.   Constipation Prevention   Complete by: As directed    Drink  plenty of fluids.  Prune juice may be helpful.  You may use a stool softener, such as Colace (over the counter) 100 mg twice a day.  Use MiraLax (over the counter) for constipation as needed.   Diet - low sodium heart healthy   Complete by: As directed    Do not sit on low chairs, stoools or toilet seats, as it may be difficult to get up from low surfaces    Complete by: As directed    Driving restrictions   Complete by: As directed    No driving for two weeks   Follow the hip precautions as taught in Physical Therapy   Complete by: As directed    Weight bearing as tolerated   Complete by: As directed      Allergies as of 03/13/2021   No Known Allergies     Medication List    TAKE these medications   aspirin EC 81 MG tablet Take 81 mg by mouth daily.   atorvastatin 80 MG tablet Commonly known as: LIPITOR TAKE 1 TABLET (80 MG TOTAL) BY MOUTH DAILY AT 6 PM.   cholecalciferol 25 MCG (1000 UNIT) tablet Commonly known as: VITAMIN D3 Take 1,000 Units by mouth daily.   clopidogrel 75 MG tablet Commonly known as: PLAVIX Take 1 tablet (75 mg total) by mouth daily.   diclofenac Sodium 1 % Gel Commonly known as: VOLTAREN Apply 2 g topically 4 (four) times daily as needed (pain).   levothyroxine 150 MCG tablet Commonly known as: SYNTHROID TAKE 1 TABLET BY MOUTH EVERY DAY BEFORE BREAKFAST What changed: See the new instructions.   metFORMIN 500 MG tablet Commonly known as: GLUCOPHAGE Take 1 tablet (500 mg total) by mouth daily with breakfast.   metoprolol succinate 25 MG 24 hr tablet Commonly known as: TOPROL-XL Take 1 tablet (25 mg total) by mouth daily.   multivitamin tablet Take 1 tablet by mouth daily.   nitroGLYCERIN 0.4 MG SL tablet Commonly known as: NITROSTAT PLACE 1 TABLET (0.4 MG TOTAL) UNDER THE TONGUE EVERY 5 (FIVE) MINUTES AS NEEDED FOR CHEST PAIN.   polyvinyl alcohol 1.4 % ophthalmic solution Commonly known as: LIQUIFILM TEARS Place 1 drop into both eyes daily as needed for dry eyes.   tamsulosin 0.4 MG Caps capsule Commonly known as: FLOMAX Take 0.4 mg by mouth daily.   telmisartan 80 MG tablet Commonly known as: MICARDIS Take 1 tablet (80 mg total) by mouth daily.   Vitamin D (Ergocalciferol) 1.25 MG (50000 UNIT) Caps capsule Commonly known as: DRISDOL Take 1 capsule (50,000 Units total) by mouth  every 7 (seven) days. What changed: additional instructions            Discharge Care Instructions  (From admission, onward)         Start     Ordered   03/13/21 0000  Weight bearing as tolerated        03/13/21 3419          Follow-up Information    Gaynelle Arabian, MD. Schedule an appointment as soon as possible for a visit on 03/26/2021.   Specialty: Orthopedic Surgery Contact information: [address] [place] [address] [phone]               Signed: Theresa Duty, PA-C Orthopedic Surgery 03/13/2021, 3:56 PM

## 2021-03-13 NOTE — Care Management Obs Status (Signed)
[facility] NOTIFICATION   Patient Details  Name: [name] MRN: [id number] Date of Birth: [birth year]   Medicare Observation Status Notification Given:  Yes    Lynnell Catalan, RN 03/13/2021, 10:33 AM

## 2021-03-13 NOTE — Evaluation (Signed)
Physical Therapy Evaluation Patient Details Name: [name] MRN: [id number] DOB: April 20, 1951 Today's Date: 03/13/2021   History of Present Illness  Admitted with 2nd right hip dislocation, H/O bil THA and TKA.  Clinical Impression  Patient is modified independent with RW. Repeats hip precautions.No further PT needs.     Follow Up Recommendations No PT follow up    Equipment Recommendations  None recommended by PT    Recommendations for Other Services       Precautions / Restrictions Precautions Precautions: Fall;Posterior Hip Required Braces or Orthoses: Knee Immobilizer - Right Knee Immobilizer - Right: On at all times Restrictions Weight Bearing Restrictions: No      Mobility  Bed Mobility Overal bed mobility: Needs Assistance;Independent                  Transfers Overall transfer level: Modified independent                  Ambulation/Gait Ambulation/Gait assistance: Modified independent (Device/Increase time) Gait Distance (Feet): 250 Feet Assistive device: Rolling walker (2 wheeled);None Gait Pattern/deviations: Step-through pattern     General Gait Details: mild limp to right when not using RW  Stairs            Wheelchair Mobility    Modified Rankin (Stroke Patients Only)       Balance Overall balance assessment: Modified Independent                                           Pertinent Vitals/Pain Pain Assessment: No/denies pain    Home Living Family/patient expects to be discharged to:: Private residence Living Arrangements: Spouse/significant other Available Help at Discharge: Family;Available 24 hours/day Type of Home: House Home Access: Stairs to enter Entrance Stairs-Rails: Right Entrance Stairs-Number of Steps: 2 Home Layout: One level Home Equipment: Walker - 2 wheels;Cane - single point;Crutches;Shower seat - built in;Grab bars - tub/shower      Prior Function Level of Independence:  Independent               Hand Dominance        Extremity/Trunk Assessment   Upper Extremity Assessment Upper Extremity Assessment: Overall WFL for tasks assessed    Lower Extremity Assessment Lower Extremity Assessment: RLE deficits/detail RLE Deficits / Details: KI in place    Cervical / Trunk Assessment Cervical / Trunk Assessment: Normal  Communication   Communication: No difficulties  Cognition Arousal/Alertness: Awake/alert Behavior During Therapy: WFL for tasks assessed/performed Overall Cognitive Status: Within Functional Limits for tasks assessed                                        General Comments      Exercises     Assessment/Plan    PT Assessment Patent does not need any further PT services  PT Problem List         PT Treatment Interventions      PT Goals (Current goals can be found in the Care Plan section)  Acute Rehab PT Goals Patient Stated Goal: go home PT Goal Formulation: All assessment and education complete, DC therapy    Frequency     Barriers to discharge        Co-evaluation  AM-PAC PT "6 Clicks" Mobility  Outcome Measure Help needed turning from your back to your side while in a flat bed without using bedrails?: None Help needed moving from lying on your back to sitting on the side of a flat bed without using bedrails?: None Help needed moving to and from a bed to a chair (including a wheelchair)?: None Help needed standing up from a chair using your arms (e.g., wheelchair or bedside chair)?: None Help needed to walk in hospital room?: None Help needed climbing 3-5 steps with a railing? : A Little 6 Click Score: 23    End of Session Equipment Utilized During Treatment: Right knee immobilizer Activity Tolerance: Patient tolerated treatment well Patient left: in bed;with call bell/phone within reach;with family/visitor present Nurse Communication: Mobility status PT Visit  Diagnosis: Difficulty in walking, not elsewhere classified (R26.2)    Time: 9532-0233 PT Time Calculation (min) (ACUTE ONLY): 12 min   Charges:   PT Evaluation $PT Eval Low Complexity: [place] Pager [phone] Office [phone]   Claretha Cooper 03/13/2021, 10:24 AM

## 2021-03-13 NOTE — Progress Notes (Signed)
   Subjective: 1 Day Post-Op Procedure(s) (LRB): CLOSED REDUCTION HIP (Right) Patient reports pain as minimal.   Patient is well, and has had no acute complaints or problems Plan is to go Home after hospital stay.  Objective: Vital signs in last 24 hours: Temp:  [97.6 F (36.4 C)-99 F (37.2 C)] 98.4 F (36.9 C) (03/16 0803) Pulse Rate:  [44-78] 49 (03/16 0803) Resp:  [10-24] 15 (03/16 0803) BP: (113-162)/(62-116) 114/66 (03/16 0803) SpO2:  [85 %-100 %] 96 % (03/16 0803) Weight:  [119.1 kg] 119.1 kg (03/15 1812)  Intake/Output from previous day:  Intake/Output Summary (Last 24 hours) at 03/13/2021 0902 Last data filed at 03/13/2021 0706 Gross per 24 hour  Intake 516 ml  Output 450 ml  Net 66 ml    Intake/Output this shift: Total I/O In: -  Out: 250 [Urine:250]  Labs: Recent Labs    03/12/21 0854 03/13/21 0517  HGB 13.8 13.4   Recent Labs    03/12/21 0854 03/13/21 0517  WBC 4.6 5.3  RBC 4.47 4.27  HCT 40.0 39.3  PLT 128* 126*   Recent Labs    03/12/21 0854 03/13/21 0517  NA 142 141  K 3.7 3.7  CL 110 108  CO2 23 25  BUN 15 12  CREATININE 0.77 0.91  GLUCOSE 132* 107*  CALCIUM 9.5 9.0   No results for input(s): LABPT, INR in the last 72 hours.  Exam: General - Patient is Alert and Oriented Extremity - Neurologically intact Neurovascular intact Sensation intact distally Dorsiflexion/Plantar flexion intact  No pain with internal/external rotation Motor Function - intact, moving foot and toes well on exam.   Past Medical History:  Diagnosis Date  . Back pain   . BPH (benign prostatic hypertrophy)   . Chest pain   . COLONIC POLYPS, HX OF 2007   clear colo w/o polyps 04/2015: 26yr follow up  . Constipation   . Coronary artery disease   . Decreased mobility    due to BL knee and hip replacement  . Fatigue   . GERD (gastroesophageal reflux disease)   . Heartburn   . History of kidney stones    passed  . Hyperlipidemia   . HYPERTENSION   .  HYPOTHYROIDISM   . Joint pain   . Myocardial infarction ([place]) 12/2018   Inferior STEMI  . OA (osteoarthritis)    Knees  . OBESITY   . OSA treated with BiPAP   . PAF (paroxysmal atrial fibrillation) ([place])   . Pre-diabetes   . Shortness of breath on exertion   . Umbilical hernia   . Ventral hernia     Assessment/Plan: 1 Day Post-Op Procedure(s) (LRB): CLOSED REDUCTION HIP (Right) Principal Problem:   Hip dislocation, right (HCC) Active Problems:   Hypothyroidism   Essential hypertension   OA (osteoarthritis) of knee   CAD S/P percutaneous coronary angioplasty   CAD (coronary artery disease), native coronary artery   HLD (hyperlipidemia)   History of ST elevation myocardial infarction (STEMI)   Bradycardia  Estimated body mass index is 35.61 kg/m as calculated from the following:   Height as of this encounter: 6' (1.829 m).   Weight as of this encounter: 119.1 kg.  Weight-bearing as tolerated Continue knee immobilizer Hip precautions discussed with patient Follow-up in the office in 2 weeks  Theresa Duty, PA-C Orthopedic Surgery [phone] 03/13/2021, 9:02 AM

## 2021-03-13 NOTE — Care Management CC44 (Signed)
Condition Code 44 Documentation Completed  Patient Details  Name: SAMY RYNER MRN: [id number] Date of Birth: 1951-07-25   Condition Code 44 given:  Yes Patient signature on Condition Code 44 notice:  Yes Documentation of 2 MD's agreement:  Yes Code 44 added to claim:  Yes    Lynnell Catalan, RN 03/13/2021, 10:34 AM

## 2021-03-13 NOTE — Anesthesia Postprocedure Evaluation (Signed)
Anesthesia Post Note  Patient: [name]  Procedure(s) Performed: CLOSED REDUCTION HIP (Right Hip)     Patient location during evaluation: PACU Anesthesia Type: General Level of consciousness: awake and alert and oriented Pain management: pain level controlled Vital Signs Assessment: post-procedure vital signs reviewed and stable Respiratory status: spontaneous breathing, nonlabored ventilation and respiratory function stable Cardiovascular status: blood pressure returned to baseline Postop Assessment: no apparent nausea or vomiting Anesthetic complications: no   No complications documented.            [name]

## 2021-03-18 DIAGNOSIS — R972 Elevated prostate specific antigen [PSA]: Secondary | ICD-10-CM | POA: Diagnosis not present

## 2021-03-18 DIAGNOSIS — N401 Enlarged prostate with lower urinary tract symptoms: Secondary | ICD-10-CM | POA: Diagnosis not present

## 2021-03-18 DIAGNOSIS — R351 Nocturia: Secondary | ICD-10-CM | POA: Diagnosis not present

## 2021-03-24 ENCOUNTER — Other Ambulatory Visit: Payer: Self-pay | Admitting: Cardiology

## 2021-03-26 DIAGNOSIS — Z96642 Presence of left artificial hip joint: Secondary | ICD-10-CM | POA: Diagnosis not present

## 2021-03-26 DIAGNOSIS — Z96641 Presence of right artificial hip joint: Secondary | ICD-10-CM | POA: Diagnosis not present

## 2021-03-28 ENCOUNTER — Other Ambulatory Visit: Payer: Self-pay

## 2021-03-28 ENCOUNTER — Ambulatory Visit (INDEPENDENT_AMBULATORY_CARE_PROVIDER_SITE_OTHER): Payer: Medicare Other | Admitting: Physician Assistant

## 2021-03-28 VITALS — BP 116/71 | HR 59 | Temp 97.8°F | Ht 71.0 in | Wt 244.0 lb

## 2021-03-28 DIAGNOSIS — R7303 Prediabetes: Secondary | ICD-10-CM

## 2021-03-28 DIAGNOSIS — Z6835 Body mass index (BMI) 35.0-35.9, adult: Secondary | ICD-10-CM

## 2021-03-28 DIAGNOSIS — E559 Vitamin D deficiency, unspecified: Secondary | ICD-10-CM

## 2021-03-28 MED ORDER — VITAMIN D (ERGOCALCIFEROL) 1.25 MG (50000 UNIT) PO CAPS: 50000.0000 [IU] | ORAL_CAPSULE | ORAL | 0 refills | Status: DC

## 2021-04-02 NOTE — Progress Notes (Signed)
Chief Complaint:   OBESITY [name] is here to discuss his progress with his obesity treatment plan along with follow-up of his obesity related diagnoses. [name] is on the Category 3 Plan and keeping a food journal and adhering to recommended goals of 450-600 calories and 40 grams of protein at supper daily and states he is following his eating plan approximately 85% of the time. [name] states he is swimming for 50-60 minutes 5-7 times per week.  Today's visit was #: 6 Starting weight: 266 lbs Starting date: 01/01/2021 Today's weight: 244 lbs Today's date: 03/28/2021 Total lbs lost to date: 22 Total lbs lost since last in-office visit: 5  Interim History: [name] did well with his weight loss. He is slightly bored with the plan but he does not want to journal. His hunger is controlled most of the time.  Subjective:   1. Vitamin D deficiency [name] is on Vit D weekly, and he is tolerating it well.  2. Pre-diabetes [name]'s last A1c was 5.3, and has much improved. He denies polyphagia. He is on metformin, and he is tolerating it well.  Assessment/Plan:   1. Vitamin D deficiency Low Vitamin D level contributes to fatigue and are associated with obesity, breast, and colon cancer. We will refill prescription Vitamin D for 1 month. [name] will follow-up for routine testing of Vitamin D, at least 2-3 times per year to avoid over-replacement.  - Vitamin D, Ergocalciferol, (DRISDOL) 1.25 MG (50000 UNIT) CAPS capsule; Take 1 capsule (50,000 Units total) by mouth every 7 (seven) days.  Dispense: 4 capsule; Refill: 0  2. Pre-diabetes [name] will continue his medications, weight loss, exercise, and decreasing simple carbohydrates to help decrease the risk of diabetes.   3. Class 2 severe obesity with serious comorbidity and body mass index (BMI) of 35.0 to 35.9 in adult, unspecified obesity type (HCC) [name] is currently in the action stage of change. As such, his goal is to continue  with weight loss efforts. He has agreed to the Category 3 Plan.   We will recheck fasting labs at his next visit.  Exercise goals: As is.  Behavioral modification strategies: meal planning and cooking strategies and keeping healthy foods in the home.  [name] has agreed to follow-up with our clinic in 2 weeks. He was informed of the importance of frequent follow-up visits to maximize his success with intensive lifestyle modifications for his multiple health conditions.   Objective:   Blood pressure 116/71, pulse (!) 59, temperature 97.8 F (36.6 C), height 5\' 11"  (1.803 m), weight 244 lb (110.7 kg), SpO2 98 %. Body mass index is 34.03 kg/m.  General: Cooperative, alert, well developed, in no acute distress. HEENT: Conjunctivae and lids unremarkable. Cardiovascular: Regular rhythm.  Lungs: Normal work of breathing. Neurologic: No focal deficits.   Lab Results  Component Value Date   CREATININE 0.91 03/13/2021   BUN 12 03/13/2021   NA 141 03/13/2021   K 3.7 03/13/2021   CL 108 03/13/2021   CO2 25 03/13/2021   Lab Results  Component Value Date   ALT 109 (H) 01/01/2021   AST 68 (H) 01/01/2021   ALKPHOS 102 01/01/2021   BILITOT 0.7 01/01/2021   Lab Results  Component Value Date   HGBA1C 5.3 03/12/2021   HGBA1C 6.0 12/11/2020   HGBA1C 5.6 12/29/2019   HGBA1C 6.1 11/03/2019   HGBA1C 5.4 12/31/2018   Lab Results  Component Value Date   INSULIN 34.7 (H) 01/01/2021   Lab Results  Component  Value Date   TSH 2.57 12/11/2020   Lab Results  Component Value Date   CHOL 112 12/11/2020   HDL 34.50 (L) 12/11/2020   LDLCALC 61 12/11/2020   LDLDIRECT 153.1 06/09/2011   TRIG 80.0 12/11/2020   CHOLHDL 3 12/11/2020   Lab Results  Component Value Date   WBC 5.3 03/13/2021   HGB 13.4 03/13/2021   HCT 39.3 03/13/2021   MCV 92.0 03/13/2021   PLT 126 (L) 03/13/2021   No results found for: IRON, TIBC, FERRITIN  Obesity Behavioral Intervention:   Approximately 15 minutes  were spent on the discussion below.  ASK: We discussed the diagnosis of obesity with [name] today and Lauren agreed to give [place] permission to discuss obesity behavioral modification therapy today.  ASSESS: Kaleo has the diagnosis of obesity and his BMI today is 34.05. Ranveer is in the action stage of change.   ADVISE: Seiya was educated on the multiple health risks of obesity as well as the benefit of weight loss to improve his health. He was advised of the need for long term treatment and the importance of lifestyle modifications to improve his current health and to decrease his risk of future health problems.  AGREE: Multiple dietary modification options and treatment options were discussed and Almus agreed to follow the recommendations documented in the above note.  ARRANGE: Jerrion was educated on the importance of frequent visits to treat obesity as outlined per CMS and USPSTF guidelines and agreed to schedule his next follow up appointment today.  Attestation Statements:   Reviewed by clinician on day of visit: allergies, medications, problem list, medical history, surgical history, family history, social history, and previous encounter notes.   Wilhemena Durie, am acting as transcriptionist for [employer], PA-C.  I have reviewed the above documentation for accuracy and completeness, and I agree with the above. Abby Potash, PA-C

## 2021-04-02 NOTE — Progress Notes (Addendum)
COVID Vaccine Completed:  x4 Date COVID Vaccine completed:   01-18-20 & 02-08-20 Has received booster: 09-11-20 &  03-28-21 COVID vaccine manufacturer: [place]     Date of COVID positive in last 90 days:  N/A  PCP - Billey Gosling, MD Cardiologist - Fransico Him, MD  Chest x-ray - N/A EKG - 01-01-21 Epic Stress Test - 10-06-18 Epic ECHO - 12-31-18 Epic Cardiac Cath - 12-30-18 Epic Pacemaker/ICD device last checked: Spinal Cord Stimulator:  Sleep Study - 2019, +sleep apnea CPAP - Yes  Fasting Blood Sugar - N/A Checks Blood Sugar _____ times a day  Blood Thinner Instructions: Plavix 75 mg.  To stop 5 days before surgery Aspirin Instructions: ASA 81 mg.  To stay on ASA Last Dose:  Activity level:  Can go up a flight of stairs slowly due to hip pain , but is able to perform activities of daily living without stopping and without symptoms of chest pain or shortness of breath.   Able to exercise without symptoms.     Anesthesia review: Hx of MI, Afib, CAD.  OSA, HTN  Patient denies shortness of breath, fever, cough and chest pain at PAT appointment  Patient verbalized understanding of instructions that were given to them at the PAT appointment. Patient was also instructed that they will need to review over the PAT instructions again at home before surgery.

## 2021-04-02 NOTE — Patient Instructions (Addendum)
DUE TO COVID-19 ONLY ONE VISITOR IS ALLOWED TO COME WITH YOU AND STAY IN THE WAITING ROOM ONLY DURING PRE OP AND PROCEDURE.   **NO VISITORS ARE ALLOWED IN THE SHORT STAY AREA OR RECOVERY ROOM!!**  IF YOU WILL BE ADMITTED INTO THE HOSPITAL YOU ARE ALLOWED ONLY TWO SUPPORT PEOPLE DURING VISITATION HOURS ONLY (10AM -8PM)   . The support person(s) may change daily. . The support person(s) must pass our screening, gel in and out, and wear a mask at all times, including in the patient's room. . Patients must also wear a mask when staff or their support person are in the room.  No visitors under the age of 54. Any visitor under the age of 8 must be accompanied by an adult.    COVID SWAB TESTING MUST BE COMPLETED ON:   Thursday, 04-11-21 @ 9:00 AM   [address] [place], [place] [address]  (Must self quarantine after testing. Follow instructions on handout.)   Your procedure is scheduled on: Monday, 04-15-21   Report to [facility] Main  Entrance    Report to admitting at 11:15 AM    Call this number if you have problems the morning of surgery [phone]   Do not eat food :After Midnight.   May have liquids until 10:45 AM day of surgery  CLEAR LIQUID DIET  Foods Allowed                                                                     Foods Excluded  Water, Black Coffee and tea, regular and decaf              liquids that you cannot  Plain Jell-O in any flavor  (No red)                                     see through such as: Fruit ices (not with fruit pulp)                                     milk, soups, orange juice              Iced Popsicles (No red)                                      All solid food                                   Apple juices Sports drinks like Gatorade (No red) Lightly seasoned clear broth or consume(fat free) Sugar, honey syrup     Oral Hygiene is also important to reduce your risk of infection.                                    Remember -  BRUSH YOUR TEETH THE MORNING OF SURGERY WITH YOUR REGULAR TOOTHPASTE  Do NOT smoke after Midnight   Take these medicines the morning of surgery with A SIP OF WATER:  Levothyroxine, Metoprolol, Tamsulosin  DO NOT TAKE ANY ORAL DIABETIC MEDICATIONS DAY OF YOUR SURGERY                               You may not have any metal on your body including  jewelry, and body piercings             Do not wear  lotions, powders, cologne, or deodorant             Men may shave face and neck.   Do not bring valuables to the hospital. [place].   Contacts, dentures or bridgework may not be worn into surgery.   Bring small overnight bag day of surgery.   Bring Bipap mask and tubing day of surgery                Please read over the following fact sheets you were given: IF YOU HAVE QUESTIONS ABOUT YOUR PRE OP INSTRUCTIONS PLEASE CALL  [facility] - Preparing for Surgery Before surgery, you can play an important role.  Because skin is not sterile, your skin needs to be as free of germs as possible.  You can reduce the number of germs on your skin by washing with CHG (chlorahexidine gluconate) soap before surgery.  CHG is an antiseptic cleaner which kills germs and bonds with the skin to continue killing germs even after washing. Please DO NOT use if you have an allergy to CHG or antibacterial soaps.  If your skin becomes reddened/irritated stop using the CHG and inform your nurse when you arrive at Short Stay. Do not shave (including legs and underarms) for at least 48 hours prior to the first CHG shower.  You may shave your face/neck.  Please follow these instructions carefully:  1.  Shower with CHG Soap the night before surgery and the  morning of surgery.  2.  If you choose to wash your hair, wash your hair first as usual with your normal  shampoo.  3.  After you shampoo, rinse your hair and body thoroughly to remove the shampoo.                              4.  Use CHG as you would any other liquid soap.  You can apply chg directly to the skin and wash.  Gently with a scrungie or clean washcloth.  5.  Apply the CHG Soap to your body ONLY FROM THE NECK DOWN.   Do   not use on face/ open                           Wound or open sores. Avoid contact with eyes, ears mouth and   genitals (private parts).                       Wash face,  Genitals (private parts) with your normal soap.             6.  Wash thoroughly, paying special attention to the area where your    surgery  will be  performed.  7.  Thoroughly rinse your body with warm water from the neck down.  8.  DO NOT shower/wash with your normal soap after using and rinsing off the CHG Soap.                9.  Pat yourself dry with a clean towel.            10.  Wear clean pajamas.            11.  Place clean sheets on your bed the night of your first shower and do not  sleep with pets. Day of Surgery : Do not apply any lotions/deodorants the morning of surgery.  Please wear clean clothes to the hospital/surgery center.  FAILURE TO FOLLOW THESE INSTRUCTIONS MAY RESULT IN THE CANCELLATION OF YOUR SURGERY  PATIENT SIGNATURE_________________________________  NURSE SIGNATURE__________________________________  ________________________________________________________________________   [name]  An incentive spirometer is a tool that can help keep your lungs clear and active. This tool measures how well you are filling your lungs with each breath. Taking long deep breaths may help reverse or decrease the chance of developing breathing (pulmonary) problems (especially infection) following:  A long period of time when you are unable to move or be active. BEFORE THE PROCEDURE   If the spirometer includes an indicator to show your best effort, your nurse or respiratory therapist will set it to a desired goal.  If possible, sit up straight or lean slightly  forward. Try not to slouch.  Hold the incentive spirometer in an upright position. INSTRUCTIONS FOR USE  1. Sit on the edge of your bed if possible, or sit up as far as you can in bed or on a chair. 2. Hold the incentive spirometer in an upright position. 3. Breathe out normally. 4. Place the mouthpiece in your mouth and seal your lips tightly around it. 5. Breathe in slowly and as deeply as possible, raising the piston or the ball toward the top of the column. 6. Hold your breath for 3-5 seconds or for as long as possible. Allow the piston or ball to fall to the bottom of the column. 7. Remove the mouthpiece from your mouth and breathe out normally. 8. Rest for a few seconds and repeat Steps 1 through 7 at least 10 times every 1-2 hours when you are awake. Take your time and take a few normal breaths between deep breaths. 9. The spirometer may include an indicator to show your best effort. Use the indicator as a goal to work toward during each repetition. 10. After each set of 10 deep breaths, practice coughing to be sure your lungs are clear. If you have an incision (the cut made at the time of surgery), support your incision when coughing by placing a pillow or rolled up towels firmly against it. Once you are able to get out of bed, walk around indoors and cough well. You may stop using the incentive spirometer when instructed by your caregiver.  RISKS AND COMPLICATIONS  Take your time so you do not get dizzy or light-headed.  If you are in pain, you may need to take or ask for pain medication before doing incentive spirometry. It is harder to take a deep breath if you are having pain. AFTER USE  Rest and breathe slowly and easily.  It can be helpful to keep track of a log of your progress. Your caregiver can provide you with a simple table to help with this. If you  are using the spirometer at home, follow these instructions: [place] IF:   You are having difficultly using the  spirometer.  You have trouble using the spirometer as often as instructed.  Your pain medication is not giving enough relief while using the spirometer.  You develop fever of 100.5 F (38.1 C) or higher. SEEK IMMEDIATE MEDICAL CARE IF:   You cough up bloody sputum that had not been present before.  You develop fever of 102 F (38.9 C) or greater.  You develop worsening pain at or near the incision site. MAKE SURE YOU:   Understand these instructions.  Will watch your condition.  Will get help right away if you are not doing well or get worse. Document Released: 04/27/2007 Document Revised: 03/08/2012 Document Reviewed: 06/28/2007 Exit[employer] Patient Information 2014 [employer], [place].   ________________________________________________________________________  WHAT IS A BLOOD TRANSFUSION? Blood Transfusion Information  A transfusion is the replacement of blood or some of its parts. Blood is made up of multiple cells which provide different functions.  Red blood cells carry oxygen and are used for blood loss replacement.  White blood cells fight against infection.  Platelets control bleeding.  Plasma helps clot blood.  Other blood products are available for specialized needs, such as hemophilia or other clotting disorders. BEFORE THE TRANSFUSION  Who gives blood for transfusions?   Healthy volunteers who are fully evaluated to make sure their blood is safe. This is blood bank blood. Transfusion therapy is the safest it has ever been in the practice of medicine. Before blood is taken from a donor, a complete history is taken to make sure that person has no history of diseases nor engages in risky social behavior (examples are intravenous drug use or sexual activity with multiple partners). The donor's travel history is screened to minimize risk of transmitting infections, such as malaria. The donated blood is tested for signs of infectious diseases, such as HIV and hepatitis.  The blood is then tested to be sure it is compatible with you in order to minimize the chance of a transfusion reaction. If you or a relative donates blood, this is often done in anticipation of surgery and is not appropriate for emergency situations. It takes many days to process the donated blood. RISKS AND COMPLICATIONS Although transfusion therapy is very safe and saves many lives, the main dangers of transfusion include:   Getting an infectious disease.  Developing a transfusion reaction. This is an allergic reaction to something in the blood you were given. Every precaution is taken to prevent this. The decision to have a blood transfusion has been considered carefully by your caregiver before blood is given. Blood is not given unless the benefits outweigh the risks. AFTER THE TRANSFUSION  Right after receiving a blood transfusion, you will usually feel much better and more energetic. This is especially true if your red blood cells have gotten low (anemic). The transfusion raises the level of the red blood cells which carry oxygen, and this usually causes an energy increase.  The nurse administering the transfusion will monitor you carefully for complications. HOME CARE INSTRUCTIONS  No special instructions are needed after a transfusion. You may find your energy is better. Speak with your caregiver about any limitations on activity for underlying diseases you may have. SEEK MEDICAL CARE IF:   Your condition is not improving after your transfusion.  You develop redness or irritation at the intravenous (IV) site. SEEK IMMEDIATE MEDICAL CARE IF:  Any of the following  symptoms occur over the next 12 hours:  Shaking chills.  You have a temperature by mouth above 102 F (38.9 C), not controlled by medicine.  Chest, back, or muscle pain.  People around you feel you are not acting correctly or are confused.  Shortness of breath or difficulty breathing.  Dizziness and fainting.  You  get a rash or develop hives.  You have a decrease in urine output.  Your urine turns a dark color or changes to pink, red, or brown. Any of the following symptoms occur over the next 10 days:  You have a temperature by mouth above 102 F (38.9 C), not controlled by medicine.  Shortness of breath.  Weakness after normal activity.  The white part of the eye turns yellow (jaundice).  You have a decrease in the amount of urine or are urinating less often.  Your urine turns a dark color or changes to pink, red, or brown. Document Released: 12/12/2000 Document Revised: 03/08/2012 Document Reviewed: 07/31/2008 [facility] Patient Information 2014 [place], [place].  _______________________________________________________________________

## 2021-04-05 ENCOUNTER — Other Ambulatory Visit: Payer: Self-pay

## 2021-04-05 ENCOUNTER — Encounter (HOSPITAL_COMMUNITY)
Admission: RE | Admit: 2021-04-05 | Discharge: 2021-04-05 | Disposition: A | Discharge status: Home / Self Care | Payer: Medicare Other | Source: Ambulatory Visit | Attending: Orthopedic Surgery | Admitting: Orthopedic Surgery

## 2021-04-05 ENCOUNTER — Other Ambulatory Visit: Payer: Self-pay | Admitting: Internal Medicine

## 2021-04-05 ENCOUNTER — Encounter (HOSPITAL_COMMUNITY): Payer: Self-pay

## 2021-04-05 DIAGNOSIS — Z7982 Long term (current) use of aspirin: Secondary | ICD-10-CM | POA: Insufficient documentation

## 2021-04-05 DIAGNOSIS — M24451 Recurrent dislocation, right hip: Secondary | ICD-10-CM | POA: Insufficient documentation

## 2021-04-05 DIAGNOSIS — Z7901 Long term (current) use of anticoagulants: Secondary | ICD-10-CM | POA: Insufficient documentation

## 2021-04-05 DIAGNOSIS — I480 Paroxysmal atrial fibrillation: Secondary | ICD-10-CM | POA: Diagnosis not present

## 2021-04-05 DIAGNOSIS — I2510 Atherosclerotic heart disease of native coronary artery without angina pectoris: Secondary | ICD-10-CM | POA: Insufficient documentation

## 2021-04-05 DIAGNOSIS — G4733 Obstructive sleep apnea (adult) (pediatric): Secondary | ICD-10-CM | POA: Diagnosis not present

## 2021-04-05 DIAGNOSIS — Z79899 Other long term (current) drug therapy: Secondary | ICD-10-CM | POA: Diagnosis not present

## 2021-04-05 DIAGNOSIS — Z9989 Dependence on other enabling machines and devices: Secondary | ICD-10-CM | POA: Insufficient documentation

## 2021-04-05 DIAGNOSIS — I10 Essential (primary) hypertension: Secondary | ICD-10-CM | POA: Insufficient documentation

## 2021-04-05 DIAGNOSIS — Z7902 Long term (current) use of antithrombotics/antiplatelets: Secondary | ICD-10-CM | POA: Diagnosis not present

## 2021-04-05 DIAGNOSIS — Z01812 Encounter for preprocedural laboratory examination: Secondary | ICD-10-CM | POA: Diagnosis not present

## 2021-04-05 LAB — COMPREHENSIVE METABOLIC PANEL
ALT: 56 U/L — ABNORMAL HIGH (ref 0–44)
AST: 36 U/L (ref 15–41)
Albumin: 4.3 g/dL (ref 3.5–5.0)
Alkaline Phosphatase: 71 U/L (ref 38–126)
Anion gap: 7 (ref 5–15)
BUN: 20 mg/dL (ref 8–23)
CO2: 22 mmol/L (ref 22–32)
Calcium: 9.3 mg/dL (ref 8.9–10.3)
Chloride: 110 mmol/L (ref 98–111)
Creatinine, Ser: 0.69 mg/dL (ref 0.61–1.24)
GFR, Estimated: >60 mL/min (ref >60)
Glucose, Bld: 115 mg/dL — ABNORMAL HIGH (ref 70–99)
Potassium: 4.1 mmol/L (ref 3.5–5.1)
Sodium: 139 mmol/L (ref 135–145)
Total Bilirubin: 0.6 mg/dL (ref 0.3–1.2)
Total Protein: 7.3 g/dL (ref 6.5–8.1)

## 2021-04-05 LAB — CBC
HCT: 44.7 % (ref 39.0–52.0)
Hemoglobin: 15.3 g/dL (ref 13.0–17.0)
MCH: 31 pg (ref 26.0–34.0)
MCHC: 34.2 g/dL (ref 30.0–36.0)
MCV: 90.5 fL (ref 80.0–100.0)
Platelets: 135 10*3/uL — ABNORMAL LOW (ref 150–400)
RBC: 4.94 MIL/uL (ref 4.22–5.81)
RDW: 12.3 % (ref 11.5–15.5)
WBC: 4.2 10*3/uL (ref 4.0–10.5)
nRBC: 0 % (ref 0.0–0.2)

## 2021-04-05 LAB — APTT: aPTT: 30 seconds (ref 24–36)

## 2021-04-05 LAB — PROTIME-INR
INR: 1.1 (ref 0.8–1.2)
Prothrombin Time: 13.6 seconds (ref 11.4–15.2)

## 2021-04-05 LAB — SURGICAL PCR SCREEN
MRSA, PCR: NEGATIVE
Staphylococcus aureus: NEGATIVE

## 2021-04-07 ENCOUNTER — Other Ambulatory Visit: Payer: Self-pay | Admitting: Internal Medicine

## 2021-04-08 ENCOUNTER — Other Ambulatory Visit: Payer: Self-pay | Admitting: Internal Medicine

## 2021-04-08 ENCOUNTER — Other Ambulatory Visit: Payer: Self-pay

## 2021-04-08 MED ORDER — LEVOTHYROXINE SODIUM 150 MCG PO TABS
ORAL_TABLET | ORAL | 0 refills | Status: DC
Start: 2021-04-08 — End: 2021-05-29

## 2021-04-08 NOTE — Progress Notes (Signed)
Anesthesia Chart Review   Case: [id number] Date/Time: 04/15/21 1434   Procedure: Right hip bearing surface revision (Right Hip) - 21min   Anesthesia type: Choice   Pre-op diagnosis: Recurrent right hip dislocation   Location: [facility] ROOM 10 / [facility]   Surgeons: Gaynelle Arabian, MD      DISCUSSION:69 y.o. never smoker with h/o HTN, PAF, CAD (stent 12/30/2018), osa on bipap, recurrent right hip dislocation scheduled for above procedure 04/15/2021 with Dr. Gaynelle Arabian.   Pt last seen by cardiology 10/16/2020. CAD stable without cv sx.  6 month follow up recommended.    Pt advised to hold Plavix 5 days prior to procedure.   S/p closed hip reduction under general anesthesia 8/84/1660 with no complications.  VS: BP 123/77   Pulse 65   Temp 36.9 C (Oral)   Resp 16   Ht 6' (1.829 m)   Wt 112.9 kg   SpO2 97%   BMI 33.77 kg/m   PROVIDERS: Binnie Rail, MD is PCP   Fransico Him, MD is Cardiologist  LABS: Labs reviewed: Acceptable for surgery. (all labs ordered are listed, but only abnormal results are displayed)  Labs Reviewed  CBC - Abnormal; Notable for the following components:      Result Value   Platelets 135 (*)    All other components within normal limits  COMPREHENSIVE METABOLIC PANEL - Abnormal; Notable for the following components:   Glucose, Bld 115 (*)    ALT 56 (*)    All other components within normal limits  SURGICAL PCR SCREEN  PROTIME-INR  APTT  TYPE AND SCREEN     IMAGES:   EKG: 01/01/21 Rate 68 bpm  Sinus rhythm  Low voltage in precordial leads  CV: Echo 12/31/2018 Study Conclusions   - Left ventricle: The cavity size was normal. Wall thickness was  increased in a pattern of mild LVH. Systolic function was  vigorous. The estimated ejection fraction was in the range of 65%  to 70%. Wall motion was normal; there were no regional wall  motion abnormalities. Doppler parameters are consistent with  abnormal left ventricular relaxation (grade 1  diastolic  dysfunction).  - Ventricular septum: The contour showed systolic flattening.  - Mitral valve: There was trivial regurgitation.  - Left atrium: The atrium was mildly dilated.   Stress Test 10/06/2018   Blood pressure demonstrated a normal response to exercise.  Normal sinus rhythm with no ST segment changes.  7 minutes of exercise, no chest pain  Overall low risk exercise treadmill test with no electrocardiographic evidence of ischemia   Past Medical History:  Diagnosis Date  . Back pain   . BPH (benign prostatic hypertrophy)   . Chest pain   . COLONIC POLYPS, HX OF 2007   clear colo w/o polyps 04/2015: 24yr follow up  . Constipation   . Coronary artery disease   . Decreased mobility    due to BL knee and hip replacement  . Fatigue   . GERD (gastroesophageal reflux disease)   . Heartburn   . History of kidney stones    passed  . Hyperlipidemia   . HYPERTENSION   . HYPOTHYROIDISM   . Joint pain   . Myocardial infarction ([place]) 12/2018   Inferior STEMI  . OA (osteoarthritis)    Knees  . OBESITY   . OSA treated with BiPAP   . PAF (paroxysmal atrial fibrillation) ([place])   . Pre-diabetes   . Shortness of breath on exertion   .  Umbilical hernia   . Ventral hernia     Past Surgical History:  Procedure Laterality Date  . arthroscopic knee surgery     (R) 2003 & (L) 2010  . CARDIAC CATHETERIZATION    . COLONOSCOPY  04/2015   no polyps ([place])  . CORONARY/GRAFT ACUTE MI REVASCULARIZATION N/A 12/30/2018   Procedure: Coronary/Graft Acute MI Revascularization;  Surgeon: Belva Crome, MD;  Location: [facility];  Service: Cardiovascular;  Laterality: N/A;  . HIP CLOSED REDUCTION Right 03/12/2021   Procedure: CLOSED REDUCTION HIP;  Surgeon: Melina Schools, MD;  Location[facility];  Service: Orthopedics;  Laterality: Right;  . LAMINECTOMY  1991  . LEFT HEART CATH AND CORONARY ANGIOGRAPHY N/A 12/30/2018   Procedure: LEFT HEART CATH AND CORONARY ANGIOGRAPHY;   Surgeon: Belva Crome, MD;  Location: [facility];  Service: Cardiovascular;  Laterality: N/A;  . TONSILLECTOMY  1990   w/ adenoids and uvula  . TOTAL HIP ARTHROPLASTY Right 01/2011   [place]  . TOTAL HIP ARTHROPLASTY Left 04/16/2016   Procedure: TOTAL HIP ARTHROPLASTY ANTERIOR APPROACH;  Surgeon: Gaynelle Arabian, MD;  [facility];  Service: Orthopedics;  Laterality: Left;  . TOTAL HIP REVISION Right 01/04/2020   Procedure: Right hip bearing surface;  Surgeon: Gaynelle Arabian, MD;  [facility];  Service: Orthopedics;  Laterality: Right;  166min  . TOTAL KNEE ARTHROPLASTY  12/27/2012   Procedure: TOTAL KNEE BILATERAL;  Surgeon: Gearlean Alf, MD;  [facility];  Service: Orthopedics;  Laterality: Bilateral;    MEDICATIONS: . aspirin EC 81 MG tablet  . atorvastatin (LIPITOR) 80 MG tablet  . cholecalciferol (VITAMIN D3) 25 MCG (1000 UNIT) tablet  . clopidogrel (PLAVIX) 75 MG tablet  . levothyroxine (SYNTHROID) 150 MCG tablet  . metFORMIN (GLUCOPHAGE) 500 MG tablet  . metoprolol succinate (TOPROL-XL) 25 MG 24 hr tablet  . nitroGLYCERIN (NITROSTAT) 0.4 MG SL tablet  . tamsulosin (FLOMAX) 0.4 MG CAPS capsule  . telmisartan (MICARDIS) 80 MG tablet  . Vitamin D, Ergocalciferol, (DRISDOL) 1.25 MG (50000 UNIT) CAPS capsule   No current facility-administered medications for this encounter.    Konrad Felix, PA-C [facility] Pre-Surgical Testing [phone]

## 2021-04-11 ENCOUNTER — Other Ambulatory Visit (HOSPITAL_COMMUNITY)
Admission: RE | Admit: 2021-04-11 | Discharge: 2021-04-11 | Disposition: A | Discharge status: Home / Self Care | Payer: Medicare Other | Source: Ambulatory Visit | Attending: Orthopedic Surgery | Admitting: Orthopedic Surgery

## 2021-04-11 DIAGNOSIS — Z20822 Contact with and (suspected) exposure to covid-19: Secondary | ICD-10-CM | POA: Diagnosis not present

## 2021-04-11 DIAGNOSIS — Z01812 Encounter for preprocedural laboratory examination: Secondary | ICD-10-CM | POA: Diagnosis not present

## 2021-04-11 LAB — SARS CORONAVIRUS 2 (TAT 6-24 HRS): SARS Coronavirus 2: NEGATIVE

## 2021-04-14 NOTE — H&P (Signed)
TOTAL HIP ADMISSION H&P  Patient is admitted for right total hip arthroplasty.  Subjective:  Chief Complaint: Right hip pain  HPI: [name], 70 y.o. male, has a history of pain and functional disability in the right hip due to instability s/p R THA.  and patient has failed non-surgical conservative treatments for greater than 12 weeks to include flexibility and strengthening excercises and activity modification. Onset of symptoms was within the past year. , with rapidlly worsening course since that time. The patient noted prior procedures of the hip to include arthroplasty on the right hip. Patient currently rates pain in the right hip at 4 out of 10 with activity. Patient has instability. Patient has evidence of a prosthesis present in his right hip that is in good position without significant abnormalities by imaging studies. This condition presents safety issues increasing the risk of falls. This patient has had repeat dislocations following R THA and R THA revision. There is no current active infection.  Patient Active Problem List   Diagnosis Date Noted  . Hip dislocation, right ([place]) 03/12/2021  . Bradycardia 03/12/2021  . Hepatic steatosis 01/03/2021  . Other fatigue 01/01/2021  . Shortness of breath on exertion 01/01/2021  . Elevated LFTs 01/01/2021  . History of ST elevation myocardial infarction (STEMI) 01/01/2021  . Vitamin D deficiency 01/01/2021  . Failed total hip arthroplasty ([place]) 01/04/2020  . OSA treated with BiPAP 05/03/2019  . CAD S/P percutaneous coronary angioplasty 01/11/2019  . CAD (coronary artery disease), native coronary artery 01/11/2019  . HLD (hyperlipidemia) 01/11/2019  . Acute ST elevation myocardial infarction (STEMI) of inferior wall ([place]) 12/30/2018  . ST elevation myocardial infarction (STEMI) ([place]) 12/30/2018  . 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 Prediabetes 05/11/2017  .  Ventral hernia without obstruction or gangrene 05/11/2017  . Umbilical hernia without obstruction or gangrene 05/11/2017  . OA (osteoarthritis) of hip 04/16/2016  . Pre-syncope 02/22/2016  . Vertigo 02/22/2016  . Knee joint replacement status 01/12/2013  . OA (osteoarthritis) of knee 12/27/2012  . Benign prostatic hyperplasia   . Hypothyroidism 09/04/2009  . Obesity 09/04/2009  . Essential hypertension 09/04/2009  . COLONIC POLYPS, HX OF 09/04/2009    Past Medical History:  Diagnosis Date  . Back pain   . BPH (benign prostatic hypertrophy)   . Chest pain   . COLONIC POLYPS, HX OF 2007   clear colo w/o polyps 04/2015: 4yr follow up  . Constipation   . Coronary artery disease   . Decreased mobility    due to BL knee and hip replacement  . Fatigue   . GERD (gastroesophageal reflux disease)   . Heartburn   . History of kidney stones    passed  . Hyperlipidemia   . HYPERTENSION   . HYPOTHYROIDISM   . Joint pain   . Myocardial infarction ([place]) 12/2018   Inferior STEMI  . OA (osteoarthritis)    Knees  . OBESITY   . OSA treated with BiPAP   . PAF (paroxysmal atrial fibrillation) ([place])   . Pre-diabetes   . Shortness of breath on exertion   . Umbilical hernia   . Ventral hernia     Past Surgical History:  Procedure Laterality Date  . arthroscopic knee surgery     (R) 2003 & (L) 2010  . CARDIAC CATHETERIZATION    . COLONOSCOPY  04/2015   no polyps ([place])  . CORONARY/GRAFT ACUTE MI REVASCULARIZATION N/A 12/30/2018  Procedure: Coronary/Graft Acute MI Revascularization;  Surgeon: Belva Crome, MD;  Location: [facility];  Service: Cardiovascular;  Laterality: N/A;  . HIP CLOSED REDUCTION Right 03/12/2021   Procedure: CLOSED REDUCTION HIP;  Surgeon: Melina Schools, MD;  Location: [facility];  Service: Orthopedics;  Laterality: Right;  . LAMINECTOMY  1991  . LEFT HEART CATH AND CORONARY ANGIOGRAPHY N/A 12/30/2018   Procedure: LEFT HEART CATH AND CORONARY ANGIOGRAPHY;   Surgeon: Belva Crome, MD;  Location: [facility];  Service: Cardiovascular;  Laterality: N/A;  . TONSILLECTOMY  1990   w/ adenoids and uvula  . TOTAL HIP ARTHROPLASTY Right 01/2011   [place]  . TOTAL HIP ARTHROPLASTY Left 04/16/2016   Procedure: TOTAL HIP ARTHROPLASTY ANTERIOR APPROACH;  Surgeon: Gaynelle Arabian, MD;  Location: [facility];  Service: Orthopedics;  Laterality: Left;  . TOTAL HIP REVISION Right 01/04/2020   Procedure: Right hip bearing surface;  Surgeon: Gaynelle Arabian, MD;  Location: [facility];  Service: Orthopedics;  Laterality: Right;  181min  . TOTAL KNEE ARTHROPLASTY  12/27/2012   Procedure: TOTAL KNEE BILATERAL;  Surgeon: Gearlean Alf, MD;  Location: [facility];  Service: Orthopedics;  Laterality: Bilateral;    Prior to Admission medications   Medication Sig Start Date End Date Taking? Authorizing Provider  aspirin EC 81 MG tablet Take 81 mg by mouth daily.   Yes [provider]  atorvastatin (LIPITOR) 80 MG tablet TAKE 1 TABLET (80 MG TOTAL) BY MOUTH DAILY AT 6 PM. 11/28/20  Yes Turner, Eber Hong, MD  cholecalciferol (VITAMIN D3) 25 MCG (1000 UNIT) tablet Take 1,000 Units by mouth daily.   Yes [provider]  clopidogrel (PLAVIX) 75 MG tablet TAKE 1 TABLET BY MOUTH EVERY DAY Patient taking differently: Take 75 mg by mouth daily. 03/26/21  Yes Sueanne Margarita, MD  metFORMIN (GLUCOPHAGE) 500 MG tablet Take 1 tablet (500 mg total) by mouth daily with breakfast. 02/12/21  Yes Abby Potash, PA-C  metoprolol succinate (TOPROL-XL) 25 MG 24 hr tablet Take 1 tablet (25 mg total) by mouth daily. 11/28/20  Yes Turner, Eber Hong, MD  nitroGLYCERIN (NITROSTAT) 0.4 MG SL tablet PLACE 1 TABLET (0.4 MG TOTAL) UNDER THE TONGUE EVERY 5 (FIVE) MINUTES AS NEEDED FOR CHEST PAIN. 11/19/20  Yes Turner, Eber Hong, MD  tamsulosin (FLOMAX) 0.4 MG CAPS capsule Take 0.4 mg by mouth daily. 03/21/20  Yes [provider]  Vitamin D, Ergocalciferol, (DRISDOL) 1.25 MG (50000 UNIT) CAPS  capsule Take 1 capsule (50,000 Units total) by mouth every 7 (seven) days. 03/28/21  Yes Abby Potash, PA-C  levothyroxine (SYNTHROID) 150 MCG tablet TAKE 1 TABLET BY MOUTH EVERY DAY BEFORE BREAKFAST 04/08/21   Binnie Rail, MD  telmisartan (MICARDIS) 80 MG tablet TAKE 1 TABLET BY MOUTH EVERY DAY 04/08/21   Binnie Rail, MD    No Known Allergies  Social History   Socioeconomic History  . Marital status: Married    Spouse name: Brixon Zhen  . Number of children: Not on file  . Years of education: Not on file  . Highest education level: Not on file  Occupational History  . Occupation: Retired  Tobacco Use  . Smoking status: Never Smoker  . Smokeless tobacco: Never Used  Vaping Use  . Vaping Use: Never used  Substance and Sexual Activity  . Alcohol use: Yes    Alcohol/week: 0.0 standard drinks    Comment: rarely, social  . Drug use: No  . Sexual activity: Not  on file  Other Topics Concern  . Not on file  Social History Narrative   Working part time, contemplating retirement end of 2016   Lives at home with spouse      Exercise: water exercises   Social Determinants of Health   Financial Resource Strain: Low Risk   . Difficulty of Paying Living Expenses: Not hard at all  Food Insecurity: No Food Insecurity  . Worried About Charity fundraiser in the Last Year: Never true  . Ran Out of Food in the Last Year: Never true  Transportation Needs: No Transportation Needs  . Lack of Transportation (Medical): No  . Lack of Transportation (Non-Medical): No  Physical Activity: Sufficiently Active  . Days of Exercise per Week: 5 days  . Minutes of Exercise per Session: 60 min  Stress: No Stress Concern Present  . Feeling of Stress : Not at all  Social Connections: Socially Integrated  . Frequency of Communication with Friends and Family: More than three times a week  . Frequency of Social Gatherings with Friends and Family: More than three times a week  . Attends Religious  Services: More than 4 times per year  . Active Member of Clubs or Organizations: Yes  . Attends Archivist Meetings: More than 4 times per year  . Marital Status: Married  Human resources officer Violence: Not on file    Tobacco Use: Low Risk   . Smoking Tobacco Use: Never Smoker  . Smokeless Tobacco Use: Never Used   Social History   Substance and Sexual Activity  Alcohol Use Yes  . Alcohol/week: 0.0 standard drinks   Comment: rarely, social    Family History  Problem Relation Age of Onset  . Heart disease Father 73       CABG age 33  . Hypertension Father   . Diabetes Father   . Hyperlipidemia Father   . Cancer Father   . Dementia Mother        ?ALS vs SNP  . ALS Maternal Grandfather   . Arthritis Other        Parent & grandparents  . Colon cancer Neg Hx     ROS: Constitutional: no fever, no chills, no night sweats, no significant weight loss Cardiovascular: no chest pain, no palpitations Respiratory: no cough, no shortness of breath, No COPD Gastrointestinal: no vomiting, no nausea Musculoskeletal: no swelling in Joints, Joint Pain Neurologic: no numbness, no tingling, no difficulty with balance    Objective:  Physical Exam: Well nourished and well developed.  General: Alert and oriented x3, cooperative and pleasant, no acute distress.  Head: normocephalic, atraumatic, neck supple.  Eyes: EOMI.  Respiratory: breath sounds clear in all fields, no wheezing, rales, or rhonchi. Cardiovascular: Regular rate and rhythm, no murmurs, gallops or rubs.  Abdomen: non-tender to palpation and soft, normoactive bowel sounds. Musculoskeletal:  Right Hip Exam:  The range of motion: Flexion to 110 degrees, Internal Rotation to 20 degrees, External Rotation to 30 degrees, and abduction to 30 degrees without discomfort.  There is no tenderness over the greater trochanteric bursa.  Calves soft and nontender. Motor function intact in LE. Strength 5/5 LE  bilaterally. Neuro: Distal pulses 2+. Sensation to light touch intact in LE.  Vital signs in last 24 hours:    Imaging Review AP Pelvis, and AP and lateral view of the R Hip dated 03/26/21 demonstrate a prosthesis in good position and alignment without evidence of loosening or complication. The hip is well located  and there is no evidence of fracture.  Assessment/Plan:  End stage arthritis, right hip  The patient history, physical examination, clinical judgement of the provider and imaging studies are consistent with end stage degenerative joint disease of the right hip and total hip arthroplasty is deemed medically necessary. The treatment options including medical management, injection therapy, arthroscopy and arthroplasty were discussed at length. The risks and benefits of total hip arthroplasty were presented and reviewed. The risks due to aseptic loosening, infection, stiffness, dislocation/subluxation, thromboembolic complications and other imponderables were discussed. The patient acknowledged the explanation, agreed to proceed with the plan and consent was signed. Patient is being admitted for inpatient treatment for surgery, pain control, PT, OT, prophylactic antibiotics, VTE prophylaxis, progressive ambulation and ADLs and discharge planning.The patient is planning to be discharged home.   Patient's anticipated LOS is less than 2 midnights, meeting these requirements: - Younger than 59 - Lives within 1 hour of care - Has a competent adult at home to recover with post-op recover - NO history of  - Chronic pain requiring opiods  - Diabetes  - Coronary Artery Disease  - Heart failure  - Heart attack  - Stroke  - DVT/VTE  - Cardiac arrhythmia  - Respiratory Failure/COPD  - Renal failure  - Anemia  - Advanced Liver disease      Therapy Plans: HEP Disposition: Home with wife Planned DVT Prophylaxis: Plavix and Aspirin 81mg   DME Needed: None PCP: Billey Gosling,  MD Cardiology: Fransico Him, MD TXA: IV Allergies: NKDA Anesthesia Concerns: None BMI: 34 Last HgbA1c: N/A  [facility] [address]  Other: Last surgery - had significant urinary retention, was same day discharge at that time, would prefer one night stay if possible to try and avoid those complications again. Also had significant bleeding through his bandage last time.   - Patient was instructed on what medications to stop prior to surgery. - Follow-up visit in 2 weeks with Dr. Wynelle Link - Begin physical therapy following surgery - Pre-operative lab work as pre-surgical testing - Prescriptions will be provided in hospital at time of discharge  Fenton Foy, [facility], PA-C Orthopedic Surgery [facility] [place]

## 2021-04-15 ENCOUNTER — Inpatient Hospital Stay (HOSPITAL_COMMUNITY)
Admission: RE | Admit: 2021-04-15 | Discharge: 2021-04-16 | DRG: 468 | Disposition: A | Discharge status: Home / Self Care | Payer: Medicare Other | Attending: Orthopedic Surgery | Admitting: Orthopedic Surgery

## 2021-04-15 ENCOUNTER — Encounter (HOSPITAL_COMMUNITY)
Admission: RE | Disposition: A | Discharge status: Home / Self Care | Payer: Self-pay | Source: Home / Self Care | Attending: Orthopedic Surgery

## 2021-04-15 ENCOUNTER — Inpatient Hospital Stay (HOSPITAL_COMMUNITY): Payer: Medicare Other | Admitting: Physician Assistant

## 2021-04-15 ENCOUNTER — Inpatient Hospital Stay (HOSPITAL_COMMUNITY): Payer: Medicare Other

## 2021-04-15 ENCOUNTER — Other Ambulatory Visit: Payer: Self-pay

## 2021-04-15 ENCOUNTER — Encounter (HOSPITAL_COMMUNITY): Payer: Self-pay | Admitting: Orthopedic Surgery

## 2021-04-15 ENCOUNTER — Inpatient Hospital Stay (HOSPITAL_COMMUNITY): Payer: Medicare Other | Admitting: Certified Registered Nurse Anesthetist

## 2021-04-15 DIAGNOSIS — Z96643 Presence of artificial hip joint, bilateral: Secondary | ICD-10-CM | POA: Diagnosis not present

## 2021-04-15 DIAGNOSIS — Z96649 Presence of unspecified artificial hip joint: Secondary | ICD-10-CM

## 2021-04-15 DIAGNOSIS — Z471 Aftercare following joint replacement surgery: Secondary | ICD-10-CM | POA: Diagnosis not present

## 2021-04-15 DIAGNOSIS — Z7982 Long term (current) use of aspirin: Secondary | ICD-10-CM

## 2021-04-15 DIAGNOSIS — I10 Essential (primary) hypertension: Secondary | ICD-10-CM | POA: Diagnosis present

## 2021-04-15 DIAGNOSIS — Z7902 Long term (current) use of antithrombotics/antiplatelets: Secondary | ICD-10-CM

## 2021-04-15 DIAGNOSIS — Y792 Prosthetic and other implants, materials and accessory orthopedic devices associated with adverse incidents: Secondary | ICD-10-CM | POA: Diagnosis present

## 2021-04-15 DIAGNOSIS — K219 Gastro-esophageal reflux disease without esophagitis: Secondary | ICD-10-CM | POA: Diagnosis present

## 2021-04-15 DIAGNOSIS — G4733 Obstructive sleep apnea (adult) (pediatric): Secondary | ICD-10-CM | POA: Diagnosis not present

## 2021-04-15 DIAGNOSIS — Z833 Family history of diabetes mellitus: Secondary | ICD-10-CM | POA: Diagnosis not present

## 2021-04-15 DIAGNOSIS — Z79890 Hormone replacement therapy (postmenopausal): Secondary | ICD-10-CM

## 2021-04-15 DIAGNOSIS — Z7984 Long term (current) use of oral hypoglycemic drugs: Secondary | ICD-10-CM

## 2021-04-15 DIAGNOSIS — R7303 Prediabetes: Secondary | ICD-10-CM | POA: Diagnosis present

## 2021-04-15 DIAGNOSIS — Z86010 Personal history of colonic polyps: Secondary | ICD-10-CM | POA: Diagnosis not present

## 2021-04-15 DIAGNOSIS — T84060A Wear of articular bearing surface of internal prosthetic right hip joint, initial encounter: Secondary | ICD-10-CM | POA: Diagnosis not present

## 2021-04-15 DIAGNOSIS — T84020A Dislocation of internal right hip prosthesis, initial encounter: Secondary | ICD-10-CM | POA: Diagnosis not present

## 2021-04-15 DIAGNOSIS — E039 Hypothyroidism, unspecified: Secondary | ICD-10-CM | POA: Diagnosis present

## 2021-04-15 DIAGNOSIS — I252 Old myocardial infarction: Secondary | ICD-10-CM | POA: Diagnosis not present

## 2021-04-15 DIAGNOSIS — Z6833 Body mass index (BMI) 33.0-33.9, adult: Secondary | ICD-10-CM

## 2021-04-15 DIAGNOSIS — Z87442 Personal history of urinary calculi: Secondary | ICD-10-CM

## 2021-04-15 DIAGNOSIS — E785 Hyperlipidemia, unspecified: Secondary | ICD-10-CM | POA: Diagnosis present

## 2021-04-15 DIAGNOSIS — Z9861 Coronary angioplasty status: Secondary | ICD-10-CM | POA: Diagnosis not present

## 2021-04-15 DIAGNOSIS — M24451 Recurrent dislocation, right hip: Secondary | ICD-10-CM | POA: Diagnosis present

## 2021-04-15 DIAGNOSIS — I25110 Atherosclerotic heart disease of native coronary artery with unstable angina pectoris: Secondary | ICD-10-CM | POA: Diagnosis not present

## 2021-04-15 DIAGNOSIS — Z83438 Family history of other disorder of lipoprotein metabolism and other lipidemia: Secondary | ICD-10-CM | POA: Diagnosis not present

## 2021-04-15 DIAGNOSIS — T84018A Broken internal joint prosthesis, other site, initial encounter: Secondary | ICD-10-CM

## 2021-04-15 DIAGNOSIS — N400 Enlarged prostate without lower urinary tract symptoms: Secondary | ICD-10-CM | POA: Diagnosis present

## 2021-04-15 DIAGNOSIS — Z8249 Family history of ischemic heart disease and other diseases of the circulatory system: Secondary | ICD-10-CM

## 2021-04-15 DIAGNOSIS — I480 Paroxysmal atrial fibrillation: Secondary | ICD-10-CM | POA: Diagnosis present

## 2021-04-15 DIAGNOSIS — I2510 Atherosclerotic heart disease of native coronary artery without angina pectoris: Secondary | ICD-10-CM | POA: Diagnosis not present

## 2021-04-15 DIAGNOSIS — Z96641 Presence of right artificial hip joint: Secondary | ICD-10-CM | POA: Diagnosis not present

## 2021-04-15 DIAGNOSIS — Z9989 Dependence on other enabling machines and devices: Secondary | ICD-10-CM | POA: Diagnosis not present

## 2021-04-15 DIAGNOSIS — Z79899 Other long term (current) drug therapy: Secondary | ICD-10-CM

## 2021-04-15 HISTORY — PX: TOTAL HIP REVISION: SHX763

## 2021-04-15 LAB — TYPE AND SCREEN
ABO/RH(D): O POS
Antibody Screen: NEGATIVE

## 2021-04-15 SURGERY — TOTAL HIP REVISION
Anesthesia: Spinal | Site: Hip | Laterality: Right

## 2021-04-15 MED ORDER — DOCUSATE SODIUM 100 MG PO CAPS
100.0000 mg | ORAL_CAPSULE | Freq: Two times a day (BID) | ORAL | Status: DC
  Administered 2021-04-15 – 2021-04-16 (×2): 100 mg via ORAL
  Filled 2021-04-15 (×2): qty 1

## 2021-04-15 MED ORDER — PROPOFOL 10 MG/ML IV BOLUS
INTRAVENOUS | Status: AC
  Filled 2021-04-15: qty 20

## 2021-04-15 MED ORDER — ONDANSETRON HCL 4 MG PO TABS: 4.0000 mg | ORAL_TABLET | Freq: Four times a day (QID) | ORAL | Status: DC | PRN

## 2021-04-15 MED ORDER — DEXAMETHASONE SODIUM PHOSPHATE 10 MG/ML IJ SOLN
8.0000 mg | Freq: Once | INTRAMUSCULAR | Status: AC
  Administered 2021-04-15: 8 mg via INTRAVENOUS

## 2021-04-15 MED ORDER — PHENOL 1.4 % MT LIQD: 1.0000 | OROMUCOSAL | Status: DC | PRN

## 2021-04-15 MED ORDER — SODIUM CHLORIDE 0.9 % IV SOLN: INTRAVENOUS | Status: DC

## 2021-04-15 MED ORDER — MENTHOL 3 MG MT LOZG: 1.0000 | LOZENGE | OROMUCOSAL | Status: DC | PRN

## 2021-04-15 MED ORDER — CLOPIDOGREL BISULFATE 75 MG PO TABS
75.0000 mg | ORAL_TABLET | Freq: Every day | ORAL | Status: DC
  Administered 2021-04-16: 75 mg via ORAL
  Filled 2021-04-15: qty 1

## 2021-04-15 MED ORDER — METOCLOPRAMIDE HCL 5 MG/ML IJ SOLN: 5.0000 mg | Freq: Three times a day (TID) | INTRAMUSCULAR | Status: DC | PRN

## 2021-04-15 MED ORDER — POVIDONE-IODINE 10 % EX SWAB
2.0000 "application " | Freq: Once | CUTANEOUS | Status: AC
  Administered 2021-04-15: 2 via TOPICAL

## 2021-04-15 MED ORDER — ORAL CARE MOUTH RINSE
15.0000 mL | Freq: Once | OROMUCOSAL | Status: AC
Start: 2021-04-15 — End: 2021-04-15
  Administered 2021-04-15: 15 mL via OROMUCOSAL

## 2021-04-15 MED ORDER — FENTANYL CITRATE (PF) 100 MCG/2ML IJ SOLN
INTRAMUSCULAR | Status: DC | PRN
  Administered 2021-04-15: 50 ug via INTRAVENOUS

## 2021-04-15 MED ORDER — DEXAMETHASONE SODIUM PHOSPHATE 10 MG/ML IJ SOLN
10.0000 mg | Freq: Once | INTRAMUSCULAR | Status: DC
  Filled 2021-04-15: qty 1

## 2021-04-15 MED ORDER — BISACODYL 10 MG RE SUPP: 10.0000 mg | Freq: Every day | RECTAL | Status: DC | PRN

## 2021-04-15 MED ORDER — ASPIRIN EC 325 MG PO TBEC
325.0000 mg | DELAYED_RELEASE_TABLET | Freq: Every day | ORAL | Status: DC
  Administered 2021-04-16: 325 mg via ORAL
  Filled 2021-04-15: qty 1

## 2021-04-15 MED ORDER — PHENYLEPHRINE HCL-NACL 10-0.9 MG/250ML-% IV SOLN
INTRAVENOUS | Status: DC | PRN
  Administered 2021-04-15: 35 ug/min via INTRAVENOUS

## 2021-04-15 MED ORDER — CHLORHEXIDINE GLUCONATE 0.12 % MT SOLN: 15.0000 mL | Freq: Once | OROMUCOSAL | Status: AC

## 2021-04-15 MED ORDER — TRAMADOL HCL 50 MG PO TABS
50.0000 mg | ORAL_TABLET | Freq: Four times a day (QID) | ORAL | Status: DC | PRN
  Administered 2021-04-15 – 2021-04-16 (×2): 50 mg via ORAL
  Filled 2021-04-15 (×2): qty 1

## 2021-04-15 MED ORDER — PROPOFOL 500 MG/50ML IV EMUL
INTRAVENOUS | Status: DC | PRN
  Administered 2021-04-15: 75 ug/kg/min via INTRAVENOUS

## 2021-04-15 MED ORDER — CEFAZOLIN SODIUM-DEXTROSE 2-4 GM/100ML-% IV SOLN
2.0000 g | INTRAVENOUS | Status: AC
  Administered 2021-04-15: 2 g via INTRAVENOUS
  Filled 2021-04-15: qty 100

## 2021-04-15 MED ORDER — EPHEDRINE 5 MG/ML INJ
INTRAVENOUS | Status: AC
  Filled 2021-04-15: qty 10

## 2021-04-15 MED ORDER — MORPHINE SULFATE (PF) 2 MG/ML IV SOLN: 0.5000 mg | INTRAVENOUS | Status: DC | PRN

## 2021-04-15 MED ORDER — HYDROCODONE-ACETAMINOPHEN 5-325 MG PO TABS: 1.0000 | ORAL_TABLET | ORAL | Status: DC | PRN

## 2021-04-15 MED ORDER — MAGNESIUM CITRATE PO SOLN: 1.0000 | Freq: Once | ORAL | Status: DC | PRN

## 2021-04-15 MED ORDER — OXYCODONE HCL 5 MG PO TABS
5.0000 mg | ORAL_TABLET | Freq: Once | ORAL | Status: DC | PRN
Start: 2021-04-15 — End: 2021-04-15

## 2021-04-15 MED ORDER — LACTATED RINGERS IV SOLN: INTRAVENOUS | Status: DC

## 2021-04-15 MED ORDER — EPHEDRINE SULFATE-NACL 50-0.9 MG/10ML-% IV SOSY
PREFILLED_SYRINGE | INTRAVENOUS | Status: DC | PRN
  Administered 2021-04-15 (×2): 10 mg via INTRAVENOUS

## 2021-04-15 MED ORDER — METHOCARBAMOL 1000 MG/10ML IJ SOLN
500.0000 mg | Freq: Four times a day (QID) | INTRAVENOUS | Status: DC | PRN
  Filled 2021-04-15: qty 5

## 2021-04-15 MED ORDER — BUPIVACAINE HCL (PF) 0.25 % IJ SOLN
INTRAMUSCULAR | Status: AC
  Filled 2021-04-15: qty 30

## 2021-04-15 MED ORDER — MIDAZOLAM HCL 5 MG/5ML IJ SOLN
INTRAMUSCULAR | Status: DC | PRN
  Administered 2021-04-15 (×2): 1 mg via INTRAVENOUS

## 2021-04-15 MED ORDER — BUPIVACAINE IN DEXTROSE 0.75-8.25 % IT SOLN
INTRATHECAL | Status: DC | PRN
  Administered 2021-04-15: 15 mg via INTRATHECAL

## 2021-04-15 MED ORDER — MIDAZOLAM HCL 2 MG/2ML IJ SOLN: 0.5000 mg | Freq: Once | INTRAMUSCULAR | Status: DC | PRN

## 2021-04-15 MED ORDER — TAMSULOSIN HCL 0.4 MG PO CAPS
0.4000 mg | ORAL_CAPSULE | Freq: Every day | ORAL | Status: DC
  Filled 2021-04-15: qty 1

## 2021-04-15 MED ORDER — MIDAZOLAM HCL 2 MG/2ML IJ SOLN
INTRAMUSCULAR | Status: AC
  Filled 2021-04-15: qty 2

## 2021-04-15 MED ORDER — METOPROLOL SUCCINATE ER 25 MG PO TB24
25.0000 mg | ORAL_TABLET | Freq: Every day | ORAL | Status: DC
  Administered 2021-04-16: 25 mg via ORAL
  Filled 2021-04-15: qty 1

## 2021-04-15 MED ORDER — OXYCODONE HCL 5 MG/5ML PO SOLN: 5.0000 mg | Freq: Once | ORAL | Status: DC | PRN

## 2021-04-15 MED ORDER — SODIUM CHLORIDE 0.9 % IR SOLN
Status: DC | PRN
  Administered 2021-04-15: 1000 mL

## 2021-04-15 MED ORDER — METHOCARBAMOL 500 MG PO TABS
500.0000 mg | ORAL_TABLET | Freq: Four times a day (QID) | ORAL | Status: DC | PRN
  Administered 2021-04-15: 500 mg via ORAL
  Filled 2021-04-15: qty 1

## 2021-04-15 MED ORDER — FENTANYL CITRATE (PF) 100 MCG/2ML IJ SOLN
INTRAMUSCULAR | Status: AC
  Filled 2021-04-15: qty 2

## 2021-04-15 MED ORDER — ACETAMINOPHEN 500 MG PO TABS
1000.0000 mg | ORAL_TABLET | Freq: Once | ORAL | Status: AC
  Administered 2021-04-15: 1000 mg via ORAL
  Filled 2021-04-15: qty 2

## 2021-04-15 MED ORDER — POLYETHYLENE GLYCOL 3350 17 G PO PACK: 17.0000 g | PACK | Freq: Every day | ORAL | Status: DC | PRN

## 2021-04-15 MED ORDER — ONDANSETRON HCL 4 MG/2ML IJ SOLN
INTRAMUSCULAR | Status: DC | PRN
  Administered 2021-04-15: 4 mg via INTRAVENOUS

## 2021-04-15 MED ORDER — IRBESARTAN 150 MG PO TABS
300.0000 mg | ORAL_TABLET | Freq: Every day | ORAL | Status: DC
  Administered 2021-04-16: 300 mg via ORAL
  Filled 2021-04-15: qty 2

## 2021-04-15 MED ORDER — ONDANSETRON HCL 4 MG/2ML IJ SOLN: 4.0000 mg | Freq: Four times a day (QID) | INTRAMUSCULAR | Status: DC | PRN

## 2021-04-15 MED ORDER — NITROGLYCERIN 0.4 MG SL SUBL: 0.4000 mg | SUBLINGUAL_TABLET | SUBLINGUAL | Status: DC | PRN

## 2021-04-15 MED ORDER — LEVOTHYROXINE SODIUM 75 MCG PO TABS
150.0000 ug | ORAL_TABLET | Freq: Every day | ORAL | Status: DC
  Administered 2021-04-16: 150 ug via ORAL
  Filled 2021-04-15: qty 2

## 2021-04-15 MED ORDER — PROMETHAZINE HCL 25 MG/ML IJ SOLN: 6.2500 mg | INTRAMUSCULAR | Status: DC | PRN

## 2021-04-15 MED ORDER — MEPERIDINE HCL 50 MG/ML IJ SOLN: 6.2500 mg | INTRAMUSCULAR | Status: DC | PRN

## 2021-04-15 MED ORDER — ACETAMINOPHEN 325 MG PO TABS
325.0000 mg | ORAL_TABLET | Freq: Four times a day (QID) | ORAL | Status: DC | PRN
  Filled 2021-04-15: qty 2

## 2021-04-15 MED ORDER — TRANEXAMIC ACID-NACL 1000-0.7 MG/100ML-% IV SOLN
1000.0000 mg | INTRAVENOUS | Status: AC
  Administered 2021-04-15: 1000 mg via INTRAVENOUS
  Filled 2021-04-15: qty 100

## 2021-04-15 MED ORDER — METOCLOPRAMIDE HCL 5 MG PO TABS: 5.0000 mg | ORAL_TABLET | Freq: Three times a day (TID) | ORAL | Status: DC | PRN

## 2021-04-15 MED ORDER — ONDANSETRON HCL 4 MG/2ML IJ SOLN
INTRAMUSCULAR | Status: AC
  Filled 2021-04-15: qty 2

## 2021-04-15 MED ORDER — CEFAZOLIN SODIUM-DEXTROSE 2-4 GM/100ML-% IV SOLN
2.0000 g | Freq: Four times a day (QID) | INTRAVENOUS | Status: AC
  Administered 2021-04-15 – 2021-04-16 (×2): 2 g via INTRAVENOUS
  Filled 2021-04-15 (×2): qty 100

## 2021-04-15 MED ORDER — ATORVASTATIN CALCIUM 40 MG PO TABS: 80.0000 mg | ORAL_TABLET | Freq: Every day | ORAL | Status: DC

## 2021-04-15 MED ORDER — BUPIVACAINE HCL 0.25 % IJ SOLN
INTRAMUSCULAR | Status: DC | PRN
  Administered 2021-04-15: 30 mL

## 2021-04-15 MED ORDER — HYDROMORPHONE HCL 1 MG/ML IJ SOLN: 0.2500 mg | INTRAMUSCULAR | Status: DC | PRN

## 2021-04-15 MED ORDER — DEXAMETHASONE SODIUM PHOSPHATE 10 MG/ML IJ SOLN
INTRAMUSCULAR | Status: AC
  Filled 2021-04-15: qty 1

## 2021-04-15 SURGICAL SUPPLY — 52 items
BAG DECANTER FOR FLEXI CONT (MISCELLANEOUS) ×2 IMPLANT
BIT DRILL 2.8X128 (BIT) ×2 IMPLANT
COVER SURGICAL LIGHT HANDLE (MISCELLANEOUS) ×2 IMPLANT
DRAPE INCISE IOBAN 66X45 STRL (DRAPES) ×2 IMPLANT
DRAPE ORTHO SPLIT 77X108 STRL (DRAPES) ×2
DRAPE POUCH INSTRU U-SHP 10X18 (DRAPES) ×2 IMPLANT
DRAPE SURG ORHT 6 SPLT 77X108 (DRAPES) ×2 IMPLANT
DRAPE U-SHAPE 47X51 STRL (DRAPES) ×2 IMPLANT
DRSG AQUACEL AG ADV 3.5X10 (GAUZE/BANDAGES/DRESSINGS) ×2 IMPLANT
DRSG EMULSION OIL 3X16 NADH (GAUZE/BANDAGES/DRESSINGS) ×2 IMPLANT
DURAPREP 26ML APPLICATOR (WOUND CARE) ×2 IMPLANT
ELECT REM PT RETURN 15FT ADLT (MISCELLANEOUS) ×2 IMPLANT
EVACUATOR 1/8 PVC DRAIN (DRAIN) ×2 IMPLANT
FACESHIELD WRAPAROUND (MASK) ×8 IMPLANT
GAUZE SPONGE 4X4 12PLY STRL (GAUZE/BANDAGES/DRESSINGS) ×2 IMPLANT
GLOVE SRG 8 PF TXTR STRL LF DI (GLOVE) ×1 IMPLANT
GLOVE SURG ENC MOIS LTX SZ6.5 (GLOVE) ×2 IMPLANT
GLOVE SURG ENC MOIS LTX SZ8 (GLOVE) ×4 IMPLANT
GLOVE SURG UNDER POLY LF SZ7 (GLOVE) ×2 IMPLANT
GLOVE SURG UNDER POLY LF SZ8 (GLOVE) ×1
GOWN STRL REUS W/TWL LRG LVL3 (GOWN DISPOSABLE) ×4 IMPLANT
HANDPIECE INTERPULSE COAX TIP (DISPOSABLE)
HEAD FEM SROM 28 +0 (Hips) ×2 IMPLANT
IMMOBILIZER KNEE 20 (SOFTGOODS) ×2
IMMOBILIZER KNEE 20 THIGH 36 (SOFTGOODS) ×1 IMPLANT
INSERT DM PINNACLE 56X49 (Insert) ×2 IMPLANT
KIT BASIN OR (CUSTOM PROCEDURE TRAY) ×2 IMPLANT
KIT TURNOVER KIT A (KITS) ×2 IMPLANT
LINER ACET MENTUM 49X28 (Liner) ×2 IMPLANT
MANIFOLD NEPTUNE II (INSTRUMENTS) ×2 IMPLANT
NDL SAFETY ECLIPSE 18X1.5 (NEEDLE) ×1 IMPLANT
NEEDLE HYPO 18GX1.5 SHARP (NEEDLE) ×1
NS IRRIG 1000ML POUR BTL (IV SOLUTION) ×2 IMPLANT
PACK TOTAL JOINT (CUSTOM PROCEDURE TRAY) ×2 IMPLANT
PASSER SUT SWANSON 36MM LOOP (INSTRUMENTS) IMPLANT
PENCIL SMOKE EVACUATOR COATED (MISCELLANEOUS) ×2 IMPLANT
PROTECTOR NERVE ULNAR (MISCELLANEOUS) ×2 IMPLANT
SET HNDPC FAN SPRY TIP SCT (DISPOSABLE) IMPLANT
STAPLER VISISTAT 35W (STAPLE) IMPLANT
STRIP CLOSURE SKIN 1/2X4 (GAUZE/BANDAGES/DRESSINGS) ×2 IMPLANT
SUCTION FRAZIER HANDLE 12FR (TUBING) ×1
SUCTION TUBE FRAZIER 12FR DISP (TUBING) ×1 IMPLANT
SUT ETHIBOND NAB CT1 #1 30IN (SUTURE) ×4 IMPLANT
SUT STRATAFIX 0 PDS 27 VIOLET (SUTURE) ×2
SUT VIC AB 2-0 CT1 27 (SUTURE) ×3
SUT VIC AB 2-0 CT1 TAPERPNT 27 (SUTURE) ×3 IMPLANT
SUTURE STRATFX 0 PDS 27 VIOLET (SUTURE) ×1 IMPLANT
SWAB COLLECTION DEVICE MRSA (MISCELLANEOUS) IMPLANT
SYR 50ML LL SCALE MARK (SYRINGE) ×2 IMPLANT
TOWEL OR 17X26 10 PK STRL BLUE (TOWEL DISPOSABLE) ×4 IMPLANT
TRAY FOLEY MTR SLVR 16FR STAT (SET/KITS/TRAYS/PACK) ×2 IMPLANT
WATER STERILE IRR 1000ML POUR (IV SOLUTION) ×4 IMPLANT

## 2021-04-15 NOTE — Discharge Instructions (Signed)
Dr. Gaynelle Arabian Total Joint Specialist [facility] [address], [place], [place] [address] [phone]  POSTOPERATIVE DIRECTIONS  BLOOD CLOT PREVENTION . Take a 325 mg Aspirin once a day for three weeks following surgery with your usual Plavix dose. Then resume one 81 mg Aspirin once a day with your usual Plavix dose. Dennis Bast may resume your vitamins/supplements upon discharge from the hospital.  [place]  . Remove items at home which could result in a fall. This includes throw rugs or furniture in walking pathways.   ICE to the affected hip every three hours for 30 minutes at a time and then as needed for pain and swelling.  Continue to use ice on the hip for pain and swelling from surgery. You may notice swelling that will progress down to the foot and ankle.  This is normal after surgery.  Elevate the leg when you are not up walking on it.    Continue to use the breathing machine which will help keep your temperature down.  It is common for your temperature to cycle up and down following surgery, especially at night when you are not up moving around and exerting yourself.  The breathing machine keeps your lungs expanded and your temperature down.  DIET You may resume your previous home diet once your are discharged from the hospital.  DRESSING / WOUND CARE / SHOWERING Keep the surgical dressing until follow up.  The dressing is water proof, so you can shower without any extra covering.  IF THE DRESSING FALLS OFF or the wound gets wet inside, change the dressing with sterile gauze.  Please use good hand washing techniques before changing the dressing.  Do not use any lotions or creams on the incision until instructed by your surgeon.   You may start showering once you are discharged home but do not submerge the incision under water. Just pat the incision dry and apply a dry gauze dressing on daily. Change the surgical dressing daily and reapply a dry  dressing each time.  ACTIVITY Walk with your walker as instructed. Use walker as long as suggested by your caregivers. Avoid periods of inactivity such as sitting longer than an hour when not asleep. This helps prevent blood clots.  You may resume a sexual relationship in one month or when given the OK by your doctor.  You may return to work once you are cleared by your doctor.  Do not drive a car for 6 weeks or until released by you surgeon.  Do not drive while taking narcotics.  WEIGHT BEARING Weight bearing as tolerated with assist device (walker, cane, etc) as directed, use it as long as suggested by your surgeon or therapist, typically at least 4-6 weeks.  POSTOPERATIVE CONSTIPATION PROTOCOL Constipation - defined medically as fewer than three stools per week and severe constipation as less than one stool per week.  One of the most common issues patients have following surgery is constipation.  Even if you have a regular bowel pattern at home, your normal regimen is likely to be disrupted due to multiple reasons following surgery.  Combination of anesthesia, postoperative narcotics, change in appetite and fluid intake all can affect your bowels.  In order to avoid complications following surgery, here are some recommendations in order to help you during your recovery period.  Colace (docusate) - Pick up an over-the-counter form of Colace or another stool softener and take twice a day as long as you are requiring  postoperative pain medications.  Take with a full glass of water daily.  If you experience loose stools or diarrhea, hold the colace until you stool forms back up.  If your symptoms do not get better within 1 week or if they get worse, check with your doctor.  Dulcolax (bisacodyl) - Pick up over-the-counter and take as directed by the product packaging as needed to assist with the movement of your bowels.  Take with a full glass of water.  Use this product as needed if not relieved  by Colace only.   MiraLax (polyethylene glycol) - Pick up over-the-counter to have on hand.  MiraLax is a solution that will increase the amount of water in your bowels to assist with bowel movements.  Take as directed and can mix with a glass of water, juice, soda, coffee, or tea.  Take if you go more than two days without a movement. Do not use MiraLax more than once per day. Call your doctor if you are still constipated or irregular after using this medication for 7 days in a row.  If you continue to have problems with postoperative constipation, please contact the office for further assistance and recommendations.  If you experience "the worst abdominal pain ever" or develop nausea or vomiting, please contact the office immediatly for further recommendations for treatment.  ITCHING  If you experience itching with your medications, try taking only a single pain pill, or even half a pain pill at a time.  You can also use Benadryl over the counter for itching or also to help with sleep.   TED HOSE STOCKINGS Wear the elastic stockings on both legs for three weeks following surgery during the day but you may remove then at night for sleeping.  MEDICATIONS See your medication summary on the "After Visit Summary" that the nursing staff will review with you prior to discharge.  You may have some home medications which will be placed on hold until you complete the course of blood thinner medication.  It is important for you to complete the blood thinner medication as prescribed by your surgeon.  Continue your approved medications as instructed at time of discharge.  PRECAUTIONS If you experience chest pain or shortness of breath - call 911 immediately for transfer to the hospital emergency department.  If you develop a fever greater that 101 F, purulent drainage from wound, increased redness or drainage from wound, foul odor from the wound/dressing, or calf pain - CONTACT YOUR SURGEON.                                                    FOLLOW-UP APPOINTMENTS Make sure you keep all of your appointments after your operation with your surgeon and caregivers. You should call the office at the above phone number and make an appointment for approximately two weeks after the date of your surgery or on the date instructed by your surgeon outlined in the "After Visit Summary".  RANGE OF MOTION AND STRENGTHENING EXERCISES  These exercises are designed to help you keep full movement of your hip joint. Follow your caregiver's or physical therapist's instructions. Perform all exercises about fifteen times, three times per day or as directed. Exercise both hips, even if you have had only one joint replacement. These exercises can be done on a training (exercise) mat, on the  floor, on a table or on a bed. Use whatever works the best and is most comfortable for you. Use music or television while you are exercising so that the exercises are a pleasant break in your day. This will make your life better with the exercises acting as a break in routine you can look forward to.  . Lying on your back, slowly slide your foot toward your buttocks, raising your knee up off the floor. Then slowly slide your foot back down until your leg is straight again.  . Lying on your back spread your legs as far apart as you can without causing discomfort.  . Lying on your side, raise your upper leg and foot straight up from the floor as far as is comfortable. Slowly lower the leg and repeat.  . Lying on your back, tighten up the muscle in the front of your thigh (quadriceps muscles). You can do this by keeping your leg straight and trying to raise your heel off the floor. This helps strengthen the largest muscle supporting your knee.  . Lying on your back, tighten up the muscles of your buttocks both with the legs straight and with the knee bent at a comfortable angle while keeping your heel on the floor.   POST-OPERATIVE OPIOID TAPER  INSTRUCTIONS: . It is important to wean off of your opioid medication as soon as possible. If you do not need pain medication after your surgery it is ok to stop day one. Marland Kitchen Opioids include: o Codeine, Hydrocodone(Norco, Vicodin), Oxycodone(Percocet, oxycontin) and hydromorphone amongst others.  . Long term and even short term use of opiods can cause: o Increased pain response o Dependence o Constipation o Depression o Respiratory depression o And more.  . Withdrawal symptoms can include o Flu like symptoms o Nausea, vomiting o And more . Techniques to manage these symptoms o Hydrate well o Eat regular healthy meals o Stay active o Use relaxation techniques(deep breathing, meditating, yoga) . Do Not substitute Alcohol to help with tapering . If you have been on opioids for less than two weeks and do not have pain than it is ok to stop all together.  . Plan to wean off of opioids o This plan should start within one week post op of your joint replacement. o Maintain the same interval or time between taking each dose and first decrease the dose.  o Cut the total daily intake of opioids by one tablet each day o Next start to increase the time between doses. o The last dose that should be eliminated is the evening dose.      IF YOU ARE TRANSFERRED TO A SKILLED REHAB FACILITY If the patient is transferred to a skilled rehab facility following release from the hospital, a list of the current medications will be sent to the facility for the patient to continue.  When discharged from the skilled rehab facility, please have the facility set up the patient's [place] prior to being released. Also, the skilled facility will be responsible for providing the patient with their medications at time of release from the facility to include their pain medication, the muscle relaxants, and their blood thinner medication. If the patient is still at the rehab facility at time of the two  week follow up appointment, the skilled rehab facility will also need to assist the patient in arranging follow up appointment in our office and any transportation needs.  MAKE SURE YOU:  . Understand these instructions.  Marland Kitchen  Get help right away if you are not doing well or get worse.    Pick up stool softner and laxative for home use following surgery while on pain medications. Do not submerge incision under water. Please use good hand washing techniques while changing dressing each day. May shower starting three days after surgery. Please use a clean towel to pat the incision dry following showers. Continue to use ice for pain and swelling after surgery. Do not use any lotions or creams on the incision until instructed by your surgeon.

## 2021-04-15 NOTE — Anesthesia Procedure Notes (Signed)
Spinal  Patient location during procedure: OR End time: 04/15/2021 2:22 PM Reason for block: surgical anesthesia Staffing Performed: anesthesiologist  Anesthesiologist: Annye Asa, MD Preanesthetic Checklist Completed: patient identified, IV checked, site marked, risks and benefits discussed, surgical consent, monitors and equipment checked, pre-op evaluation and timeout performed Spinal Block Patient position: sitting Prep: DuraPrep and site prepped and draped Patient monitoring: blood pressure, continuous pulse ox, cardiac monitor and heart rate Approach: midline Location: L3-4 Injection technique: single-shot Needle Needle type: Pencan and Introducer  Needle gauge: 24 G Needle length: 9 cm Assessment Events: CSF return Additional Notes Pt identified in Operating room.  Monitors applied. Working IV access confirmed. Sterile prep, drape lumbar spine.  1% lido local L 3,4.  #24ga Pencan into clear CSF L 3,4 second pass.  15mg  0.75% Bupivacaine with dextrose injected with asp CSF beginning and end of injection.  Patient asymptomatic, VSS, no heme aspirated, tolerated well.  Jenita Seashore, MD

## 2021-04-15 NOTE — Anesthesia Postprocedure Evaluation (Signed)
Anesthesia Post Note  Patient: [name]  Procedure(s) Performed: Right hip bearing surface revision (Right Hip)     Patient location during evaluation: PACU Anesthesia Type: Spinal Level of consciousness: awake and alert, patient cooperative and oriented Pain management: pain level controlled Vital Signs Assessment: post-procedure vital signs reviewed and stable Respiratory status: nonlabored ventilation, spontaneous breathing and respiratory function stable Cardiovascular status: blood pressure returned to baseline and stable Postop Assessment: no apparent nausea or vomiting, patient able to bend at knees and spinal receding Anesthetic complications: no   No complications documented.  Last Vitals:  Vitals:   04/15/21 1645 04/15/21 1700  BP: (!) 151/82 (!) 171/81  Pulse: (!) 46 (!) 46  Resp: 18 19  Temp:  36.4 C  SpO2: 100% 100%    Last Pain:  Vitals:   04/15/21 1700  TempSrc:   PainSc: 0-No pain                 Marcy Bogosian,E. Parveen Freehling

## 2021-04-15 NOTE — Anesthesia Procedure Notes (Signed)
Procedure Name: MAC Date/Time: 04/15/2021 2:10 PM Performed by: Maxwell Caul, CRNA Pre-anesthesia Checklist: Patient identified, Emergency Drugs available, Suction available and Patient being monitored Oxygen Delivery Method: Simple face mask

## 2021-04-15 NOTE — Transfer of Care (Signed)
Immediate Anesthesia Transfer of Care Note  Patient: [name]  Procedure(s) Performed: Right hip bearing surface revision (Right Hip)  Patient Location: PACU  Anesthesia Type:Spinal  Level of Consciousness: awake, alert  and oriented  Airway & Oxygen Therapy: Patient Spontanous Breathing and Patient connected to face mask oxygen  Post-op Assessment: Report given to RN and Post -op Vital signs reviewed and stable  Post vital signs: Reviewed  Last Vitals:  Vitals Value Taken Time  BP    Temp    Pulse    Resp    SpO2      Last Pain:  Vitals:   04/15/21 1132  TempSrc: Oral         Complications: No complications documented.

## 2021-04-15 NOTE — Brief Op Note (Signed)
  04/15/2021  3:23 PM  PATIENT:  [name]  70 y.o. male  PRE-OPERATIVE DIAGNOSIS:  Recurrent right hip dislocation  POST-OPERATIVE DIAGNOSIS:  Recurrent right hip dislocation  PROCEDURE:  Procedure(s) with comments: Right hip bearing surface revision (Right) - 62min  SURGEON:  Surgeon(s) and Role:    Gaynelle Arabian, MD - Primary  PHYSICIAN ASSISTANT:   ASSISTANTS: Theresa Duty, PA-C   ANESTHESIA:   spinal  EBL:  200 mL   BLOOD ADMINISTERED:none  DRAINS: none   LOCAL MEDICATIONS USED:  MARCAINE     COUNTS:  YES  TOURNIQUET:  * No tourniquets in log *  DICTATION: .Other Dictation: Dictation Number [id number]  PLAN OF CARE: Admit for overnight observation  PATIENT DISPOSITION:  PACU - hemodynamically stable.

## 2021-04-15 NOTE — Interval H&P Note (Signed)
History and Physical Interval Note:  04/15/2021 1:05 PM  [name]  has presented today for surgery, with the diagnosis of Recurrent right hip dislocation.  The various methods of treatment have been discussed with the patient and family. After consideration of risks, benefits and other options for treatment, the patient has consented to  Procedure(s) with comments: Right hip bearing surface revision (Right) - 29min as a surgical intervention.  The patient's history has been reviewed, patient examined, no change in status, stable for surgery.  I have reviewed the patient's chart and labs.  Questions were answered to the patient's satisfaction.     Pilar Plate Remy Dia

## 2021-04-15 NOTE — Anesthesia Preprocedure Evaluation (Addendum)
Anesthesia Evaluation  Patient identified by MRN, date of birth, ID band Patient awake    Reviewed: Allergy & Precautions, NPO status , Patient's Chart, lab work & pertinent test results  History of Anesthesia Complications Negative for: history of anesthetic complications  Airway Mallampati: I  TM Distance: >3 FB Neck ROM: Full    Dental  (+) Dental Advisory Given   Pulmonary sleep apnea ,  04/11/2021 SARS coronavirus NEG   breath sounds clear to auscultation       Cardiovascular hypertension, Pt. on medications and Pt. on home beta blockers (-) angina+ CAD, + Past MI and + Cardiac Stents  + dysrhythmias Atrial Fibrillation  Rhythm:Regular Rate:Normal  '20 ECHO: EF 65-70%. Wall motion was normal; no regional wall motion abnormalities. grade 1DD, no significant valvular abnormalities '20 cath:Reduction of 95% PDA  to less than 20% with a 20 x 3.5 mm Synergy postdilated to 4.0 mm, Likely chronic occlusion of the distal circumflex receiving collaterals from the PDA and also from the first obtuse marginal.  Failed PCI on distal circumflex, 70% mid LAD best treated with medical therapy at this time.Normal left main, normal LV function with EF 60%.  Inferobasal hypokinesis      Neuro/Psych negative neurological ROS     GI/Hepatic Neg liver ROS, GERD  Controlled,  Endo/Other  diabetes (glu 115), Oral Hypoglycemic AgentsHypothyroidism Morbid obesity  Renal/GU negative Renal ROS     Musculoskeletal  (+) Arthritis , Osteoarthritis,    Abdominal   Peds  Hematology Plavix: last dose 04/10/2021   Anesthesia Other Findings   Reproductive/Obstetrics                            Anesthesia Physical Anesthesia Plan  ASA: III  Anesthesia Plan: Spinal   Post-op Pain Management:    Induction:   PONV Risk Score and Plan: 1 and Ondansetron and Treatment may vary due to age or medical condition  Airway  Management Planned: Natural Airway and Simple Face Mask  Additional Equipment: None  Intra-op Plan:   Post-operative Plan:   Informed Consent: I have reviewed the patients History and Physical, chart, labs and discussed the procedure including the risks, benefits and alternatives for the proposed anesthesia with the patient or authorized representative who has indicated his/her understanding and acceptance.     Dental advisory given  Plan Discussed with: CRNA and Surgeon  Anesthesia Plan Comments:         Anesthesia Quick Evaluation

## 2021-04-16 ENCOUNTER — Ambulatory Visit: Payer: Medicare Other | Admitting: Cardiology

## 2021-04-16 ENCOUNTER — Encounter (HOSPITAL_COMMUNITY): Payer: Self-pay | Admitting: Orthopedic Surgery

## 2021-04-16 LAB — CBC
HCT: 40.4 % (ref 39.0–52.0)
Hemoglobin: 13.8 g/dL (ref 13.0–17.0)
MCH: 30.9 pg (ref 26.0–34.0)
MCHC: 34.2 g/dL (ref 30.0–36.0)
MCV: 90.4 fL (ref 80.0–100.0)
Platelets: 124 10*3/uL — ABNORMAL LOW (ref 150–400)
RBC: 4.47 MIL/uL (ref 4.22–5.81)
RDW: 11.9 % (ref 11.5–15.5)
WBC: 7 10*3/uL (ref 4.0–10.5)
nRBC: 0 % (ref 0.0–0.2)

## 2021-04-16 LAB — BASIC METABOLIC PANEL
Anion gap: 6 (ref 5–15)
BUN: 12 mg/dL (ref 8–23)
CO2: 22 mmol/L (ref 22–32)
Calcium: 8.7 mg/dL — ABNORMAL LOW (ref 8.9–10.3)
Chloride: 110 mmol/L (ref 98–111)
Creatinine, Ser: 0.8 mg/dL (ref 0.61–1.24)
GFR, Estimated: >60 mL/min (ref >60)
Glucose, Bld: 157 mg/dL — ABNORMAL HIGH (ref 70–99)
Potassium: 3.7 mmol/L (ref 3.5–5.1)
Sodium: 138 mmol/L (ref 135–145)

## 2021-04-16 MED ORDER — TRAMADOL HCL 50 MG PO TABS: 50.0000 mg | ORAL_TABLET | Freq: Four times a day (QID) | ORAL | 0 refills | Status: DC | PRN

## 2021-04-16 MED ORDER — METHOCARBAMOL 500 MG PO TABS: 500.0000 mg | ORAL_TABLET | Freq: Four times a day (QID) | ORAL | 0 refills | Status: DC | PRN

## 2021-04-16 MED ORDER — HYDROCODONE-ACETAMINOPHEN 5-325 MG PO TABS: 1.0000 | ORAL_TABLET | Freq: Four times a day (QID) | ORAL | 0 refills | Status: DC | PRN

## 2021-04-16 MED ORDER — ASPIRIN 325 MG PO TBEC: DELAYED_RELEASE_TABLET | ORAL | 0 refills | Status: DC

## 2021-04-16 NOTE — TOC Transition Note (Signed)
Transition of Care [facility]) - CM/SW Discharge Note   Patient Details  Name: [name] MRN: [id number] Date of Birth: 02/11/1951  Transition of Care [facility]) CM/SW Contact:  Lennart Pall, LCSW Phone Number: 04/16/2021, 10:57 AM   Clinical Narrative:    Met with pt and spouse who are eager for dc today.  Pt reports good session with PT.  They confirm pt has all needed DME at home.  Plan HEP.  Wife available to assistance.  No TOC needs.   Final next level of care: Home/Self Care Barriers to Discharge: No Barriers Identified   Patient Goals and CMS Choice Patient states their goals for this hospitalization and ongoing recovery are:: return home      Discharge Placement                       Discharge Plan and Services                DME Arranged: N/A DME Agency: NA                  Social Determinants of Health (SDOH) Interventions     Readmission Risk Interventions Readmission Risk Prevention Plan 04/16/2021  Post Dischage Appt Complete  Medication Screening Complete  Transportation Screening Complete  Some recent data might be hidden

## 2021-04-16 NOTE — Evaluation (Signed)
Physical Therapy Evaluation Patient Details Name: [name] MRN: [id number] DOB: 1951-03-26 Today's Date: 04/16/2021   History of Present Illness  s/p R hip revision. PMH: right hip dislocation x2,  bil THA and bil TKA.  Clinical Impression  Patient evaluated by Physical Therapy with no further acute PT needs identified. All education has been completed and the patient has no further questions.  Pt doing well today. He is very active and independent at his baseline. Reviewed posterior THP with handout. Verbally reviewed HEP (with handout) and stairs. ptis familiar with techniques from previous surgeries   See below for any follow-up Physical Therapy or equipment needs. PT is signing off. Thank you for this referral.     Follow Up Recommendations Follow surgeon's recommendation for DC plan and follow-up therapies    Equipment Recommendations  None recommended by PT    Recommendations for Other Services       Precautions / Restrictions Precautions Precautions: Posterior Hip Restrictions Weight Bearing Restrictions: No      Mobility  Bed Mobility Overal bed mobility: Needs Assistance Bed Mobility: Supine to Sit     Supine to sit: Supervision;HOB elevated     General bed mobility comments: cues for THP/technique    Transfers Overall transfer level: Needs assistance Equipment used: Rolling walker (2 wheeled) Transfers: Sit to/from Stand Sit to Stand: Supervision         General transfer comment: cues to extend RLE with descent  Ambulation/Gait Ambulation/Gait assistance: Supervision Gait Distance (Feet): 180 Feet Assistive device: Rolling walker (2 wheeled) Gait Pattern/deviations: Step-through pattern;Decreased stance time - right     General Gait Details: good stability with RW, no LOB, pain controlled. improved wt shift with distance  Stairs            Wheelchair Mobility    Modified Rankin (Stroke Patients Only)       Balance Overall  balance assessment: Mild deficits observed, not formally tested                                           Pertinent Vitals/Pain Pain Assessment: 0-10 Pain Score: 2  Pain Location: right hip Pain Descriptors / Indicators: Discomfort Pain Intervention(s): Limited activity within patient's tolerance;Monitored during session    Home Living Family/patient expects to be discharged to:: Private residence Living Arrangements: Spouse/significant other Available Help at Discharge: Family;Available 24 hours/day Type of Home: House Home Access: Stairs to enter   [employer] of Steps: 2 Home Layout: One level Home Equipment: Walker - 2 wheels;Cane - single point;Crutches;Shower seat - built in;Grab bars - tub/shower      Prior Function Level of Independence: Independent         Comments: active and independent, enjoys swimming for exercise     Hand Dominance        Extremity/Trunk Assessment   Upper Extremity Assessment Upper Extremity Assessment: Overall WFL for tasks assessed    Lower Extremity Assessment Lower Extremity Assessment: Overall WFL for tasks assessed (AROM within precaution limits)       Communication   Communication: No difficulties  Cognition Arousal/Alertness: Awake/alert Behavior During Therapy: WFL for tasks assessed/performed Overall Cognitive Status: Within Functional Limits for tasks assessed  General Comments      Exercises     Assessment/Plan    PT Assessment Patent does not need any further PT services  PT Problem List         PT Treatment Interventions      PT Goals (Current goals can be found in the Care Plan section)  Acute Rehab PT Goals Patient Stated Goal: home today, get back to swimming PT Goal Formulation: All assessment and education complete, DC therapy    Frequency     Barriers to discharge        Co-evaluation                AM-PAC PT "6 Clicks" Mobility  Outcome Measure Help needed turning from your back to your side while in a flat bed without using bedrails?: A Little Help needed moving from lying on your back to sitting on the side of a flat bed without using bedrails?: A Little Help needed moving to and from a bed to a chair (including a wheelchair)?: A Little Help needed standing up from a chair using your arms (e.g., wheelchair or bedside chair)?: A Little Help needed to walk in hospital room?: A Little Help needed climbing 3-5 steps with a railing? : A Little 6 Click Score: 18    End of Session Equipment Utilized During Treatment: Gait belt Activity Tolerance: Patient tolerated treatment well Patient left: in chair;with call bell/phone within reach;with family/visitor present   PT Visit Diagnosis: Other abnormalities of gait and mobility (R26.89);Difficulty in walking, not elsewhere classified (R26.2)    Time: 4801-6553 PT Time Calculation (min) (ACUTE ONLY): 25 min   Charges:   PT Evaluation $PT Eval Low Complexity: 1 Low PT Treatments $Gait Training: 8-22 mins        Baxter Flattery, PT  Acute Rehab Dept ([place]) [phone] Pager [phone]  04/16/2021   [facility] 04/16/2021, 11:05 AM

## 2021-04-16 NOTE — Plan of Care (Signed)
Patient discharged home in stable condition 

## 2021-04-16 NOTE — Progress Notes (Signed)
   Subjective: 1 Day Post-Op Procedure(s) (LRB): Right hip bearing surface revision (Right) Patient reports pain as mild.   Patient seen in rounds by Dr. Wynelle Link. Patient is well, and has had no acute complaints or problems. States he is ready to go home. No issues overnight, foley catheter removed this AM. We will begin therapy today.   Objective: Vital signs in last 24 hours: Temp:  [97.6 F (36.4 C)-98.6 F (37 C)] 98.6 F (37 C) (04/19 0542) Pulse Rate:  [42-58] 42 (04/19 0542) Resp:  [9-20] 20 (04/19 0542) BP: (123-171)/(64-85) 123/72 (04/19 0542) SpO2:  [95 %-100 %] 95 % (04/19 0542) Weight:  [112.9 kg] 112.9 kg (04/18 2228)  Intake/Output from previous day:  Intake/Output Summary (Last 24 hours) at 04/16/2021 0731 Last data filed at 04/16/2021 4081 Gross per 24 hour  Intake 4160.79 ml  Output 3300 ml  Net 860.79 ml     Intake/Output this shift: No intake/output data recorded.  Labs: Recent Labs    04/16/21 0303  HGB 13.8   Recent Labs    04/16/21 0303  WBC 7.0  RBC 4.47  HCT 40.4  PLT 124*   Recent Labs    04/16/21 0303  NA 138  K 3.7  CL 110  CO2 22  BUN 12  CREATININE 0.80  GLUCOSE 157*  CALCIUM 8.7*   No results for input(s): LABPT, INR in the last 72 hours.  Exam: General - Patient is Alert and Oriented Extremity - Neurologically intact Neurovascular intact Sensation intact distally Dorsiflexion/Plantar flexion intact Dressing - dressing C/D/I Motor Function - intact, moving foot and toes well on exam.   Past Medical History:  Diagnosis Date  . Back pain   . BPH (benign prostatic hypertrophy)   . Chest pain   . COLONIC POLYPS, HX OF 2007   clear colo w/o polyps 04/2015: 65yr follow up  . Constipation   . Coronary artery disease   . Decreased mobility    due to BL knee and hip replacement  . Fatigue   . GERD (gastroesophageal reflux disease)   . Heartburn   . History of kidney stones    passed  . Hyperlipidemia   .  HYPERTENSION   . HYPOTHYROIDISM   . Joint pain   . Myocardial infarction ([place]) 12/2018   Inferior STEMI  . OA (osteoarthritis)    Knees  . OBESITY   . OSA treated with BiPAP   . PAF (paroxysmal atrial fibrillation) ([place])   . Pre-diabetes   . Shortness of breath on exertion   . Umbilical hernia   . Ventral hernia     Assessment/Plan: 1 Day Post-Op Procedure(s) (LRB): Right hip bearing surface revision (Right) Principal Problem:   Failed total hip arthroplasty (HCC) Active Problems:   Recurrent dislocation of right hip  Estimated body mass index is 33.77 kg/m as calculated from the following:   Height as of this encounter: 6' (1.829 m).   Weight as of this encounter: 112.9 kg. Advance diet Up with therapy D/C IV fluids  DVT Prophylaxis - Aspirin and Plavix Weight bearing as tolerated. Begin therapy - posterior hip precautions  Plan is to go Home after hospital stay. Plan for discharge with HEP after one session of therapy if meeting goals. Follow-up in the office in 2 weeks.  The PDMP database was reviewed today prior to any opioid medications being prescribed to this patient.  Theresa Duty, PA-C Orthopedic Surgery [phone] 04/16/2021, 7:31 AM

## 2021-04-16 NOTE — Op Note (Signed)
NAMEBENNO, BRENSINGER MEDICAL RECORD NO: [id number] ACCOUNT NO: [id number] DATE OF BIRTH: [birth year] FACILITY: [facility]: [id number] PHYSICIAN: Dione Plover. Kaylaann Mountz, MD  Operative Report   DATE OF PROCEDURE: 04/15/2021  PREOPERATIVE DIAGNOSIS:  Recurrent dislocation, right total hip arthroplasty.  POSTOPERATIVE DIAGNOSIS:  Recurrent dislocation, right total hip arthroplasty.  PROCEDURE:  Right hip bearing surface revision.  SURGEON:  Dione Plover. Lesta Limbert, MD  ASSISTANT:  Theresa Duty, PA-C  ANESTHESIA:  Spinal.  ESTIMATED BLOOD LOSS:  200.  DRAINS:  None.  COMPLICATIONS:  None.  CONDITION:  Stable to recovery.  BRIEF CLINICAL NOTE:  The patient is a 70 year old male who had a right total hip arthroplasty revision performed a year ago.  He had a metal-on-metal hip, which was converted to a metal-on-polyethylene.  He had 2 dislocations approximately a month apart  just recently.  It was felt that he had developed inherent instability in the hip and that he was at high risk for future further dislocations.  He presents now for revision to a dual mobility construct.  DESCRIPTION OF PROCEDURE:  After successful administration of spinal anesthetic, the patient was placed in the left lateral decubitus position, with the right side up and held with a hip positioner.  Right lower extremity was isolated from his perineum  with plastic drapes and prepped and draped in the usual sterile fashion.  A short posterolateral incision was made with a 10 blade through subcutaneous tissue through the fascia lata, which was incised in line with the skin incision.  Sciatic nerve was  palpated and protected and the joint entered.  There was some hematoma present, which is thoroughly irrigated.  The hip is dislocated and the femoral head is removed.  The femoral components is in excellent position and is well fixed.  I translated the  femoral component anteriorly to gain acetabular exposure.  Acetabular  retractors were placed in order to address the acetabulum.  It was in good position and it is well fixed.  The liner was then removed with the extraction device.  I cleared the edges of the acetabular shell and then placed the liner for the  dual mobility component.  I impacted this into position and it is well fixed.  We trialed a 28, 0 head with the 39 dual mobility head and reduced that with outstanding stability through full range of motion.  Hip is reduced and the trial removed.  The  dual mobility is assembled on the back table with a 28+0 head and the 39 polyethylene head.  They were impacted together and then the head is placed onto the femoral neck.  The hip is reduced with outstanding stability, full extension, full external  rotation, 70 degrees flexion, 40 degrees adduction, 90 degrees internal rotation, 90 degrees of flexion, 70 degrees of internal rotation.  By placing the right leg on top of the left, the leg lengths are equal.  The wound was then copiously irrigated  with saline solution.  The fascia lata is closed with a running 0 Stratafix suture.  30 mL of 0.25% Marcaine with epinephrine injected in the subcutaneous tissues.  Subcutaneous was closed in 2 layers with interrupted 2-0 Vicryl.  Subcuticular was closed  with a running 4-0 Monocryl.  Incision was cleaned and dried and Steri-Strips and a bulky sterile dressing applied.  He was then awakened and transported to recovery in stable condition.  Note that a surgical assistant was of medical necessity for this procedure to do it in a safe  and expeditious manner.  Surgical assistant was necessary for retraction of vital neurovascular structures and for proper positioning of the limb for safe  removal of the old implant and safe and accurate placement of new implant.   SHW D: 04/15/2021 3:28:47 pm T: 04/16/2021 6:01:00 am  JOB: [id number]/ [id number]

## 2021-04-17 NOTE — Discharge Summary (Signed)
Physician Discharge Summary   Patient ID: [name] MRN: [id number] DOB/AGE: 08/02/1951 70 y.o.  Admit date: 04/15/2021 Discharge date: 04/16/2021  Primary Diagnosis: Recurrent dislocation, right total hip arthroplasty   Admission Diagnoses:  Past Medical History:  Diagnosis Date  . Back pain   . BPH (benign prostatic hypertrophy)   . Chest pain   . COLONIC POLYPS, HX OF 2007   clear colo w/o polyps 04/2015: 43yr follow up  . Constipation   . Coronary artery disease   . Decreased mobility    due to BL knee and hip replacement  . Fatigue   . GERD (gastroesophageal reflux disease)   . Heartburn   . History of kidney stones    passed  . Hyperlipidemia   . HYPERTENSION   . HYPOTHYROIDISM   . Joint pain   . Myocardial infarction ([place]) 12/2018   Inferior STEMI  . OA (osteoarthritis)    Knees  . OBESITY   . OSA treated with BiPAP   . PAF (paroxysmal atrial fibrillation) ([place])   . Pre-diabetes   . Shortness of breath on exertion   . Umbilical hernia   . Ventral hernia    Discharge Diagnoses:   Principal Problem:   Failed total hip arthroplasty ([place]) Active Problems:   Recurrent dislocation of right hip  Estimated body mass index is 33.77 kg/m as calculated from the following:   Height as of this encounter: 6' (1.829 m).   Weight as of this encounter: 112.9 kg.  Procedure:  Procedure(s) (LRB): Right hip bearing surface revision (Right)   Consults: None  HPI: The patient is a 70 year old male who had a right total hip arthroplasty revision performed a year ago.  He had a metal-on-metal hip, which was converted to a metal-on-polyethylene.  He had 2 dislocations approximately a month apart  just recently.  It was felt that he had developed inherent instability in the hip and that he was at high risk for future further dislocations.  He presents now for revision to a dual mobility construct.  Laboratory Data: Admission on 04/15/2021, Discharged on 04/16/2021   Component Date Value Ref Range Status  . WBC 04/16/2021 7.0  4.0 - 10.5 K/uL Final  . RBC 04/16/2021 4.47  4.22 - 5.81 MIL/uL Final  . Hemoglobin 04/16/2021 13.8  13.0 - 17.0 g/dL Final  . HCT 04/16/2021 40.4  39.0 - 52.0 % Final  . MCV 04/16/2021 90.4  80.0 - 100.0 fL Final  . MCH 04/16/2021 30.9  26.0 - 34.0 pg Final  . MCHC 04/16/2021 34.2  30.0 - 36.0 g/dL Final  . RDW 04/16/2021 11.9  11.5 - 15.5 % Final  . Platelets 04/16/2021 124* 150 - 400 K/uL Final   Comment: Immature Platelet Fraction may be clinically indicated, consider ordering this additional test [id number] CONSISTENT WITH PREVIOUS RESULT REPEATED TO VERIFY   . nRBC 04/16/2021 0.0  0.0 - 0.2 % Final   Performed at [facility] [address], [place], [place] [address]  . Sodium 04/16/2021 138  135 - 145 mmol/L Final  . Potassium 04/16/2021 3.7  3.5 - 5.1 mmol/L Final  . Chloride 04/16/2021 110  98 - 111 mmol/L Final  . CO2 04/16/2021 22  22 - 32 mmol/L Final  . Glucose, Bld 04/16/2021 157* 70 - 99 mg/dL Final   Glucose reference range applies only to samples taken after fasting for at least 8 hours.  . BUN 04/16/2021 12  8 - 23 mg/dL Final  .  Creatinine, Ser 04/16/2021 0.80  0.61 - 1.24 mg/dL Final  . Calcium 04/16/2021 8.7* 8.9 - 10.3 mg/dL Final  . GFR, Estimated 04/16/2021 >60  >60 mL/min Final   Comment: (NOTE) Calculated using the CKD-EPI Creatinine Equation (2021)   . Anion gap 04/16/2021 6  5 - 15 Final   Performed at [facility], [place] [address], [place], [place] [address]  Hospital Outpatient Visit on 04/11/2021  Component Date Value Ref Range Status  . SARS Coronavirus 2 04/11/2021 NEGATIVE  NEGATIVE Final   Comment: (NOTE) SARS-CoV-2 target nucleic acids are NOT DETECTED.  The SARS-CoV-2 RNA is generally detectable in upper and lower respiratory specimens during the acute phase of infection. Negative results do not preclude SARS-CoV-2 infection, do not rule  out co-infections with other pathogens, and should not be used as the sole basis for treatment or other patient management decisions. Negative results must be combined with clinical observations, patient history, and epidemiological information. The expected result is Negative.  Fact Sheet for Patients: SugarRoll.be  Fact Sheet for Healthcare Providers: https://www.woods-mathews.com/  This test is not yet approved or cleared by the [place] FDA and  has been authorized for detection and/or diagnosis of SARS-CoV-2 by FDA under an Emergency Use Authorization (EUA). This EUA will remain  in effect (meaning this test can be used) for the duration of the COVID-19 declaration under Se                          ction 564(b)(1) of the Act, 21 U.S.C. section 360bbb-3(b)(1), unless the authorization is terminated or revoked sooner.  Performed at [facility], [place] [address], [place], [place] [address]   Hospital Outpatient Visit on 04/05/2021  Component Date Value Ref Range Status  . MRSA, PCR 04/05/2021 NEGATIVE  NEGATIVE Final  . Staphylococcus aureus 04/05/2021 NEGATIVE  NEGATIVE Final   Comment: (NOTE) The Xpert SA Assay (FDA approved for NASAL specimens in patients 17 years of age and older), is one component of a comprehensive surveillance program. It is not intended to diagnose infection nor to guide or monitor treatment. Performed at [facility], [place] [address] [place], [place] [address]   . WBC 04/05/2021 4.2  4.0 - 10.5 K/uL Final  . RBC 04/05/2021 4.94  4.22 - 5.81 MIL/uL Final  . Hemoglobin 04/05/2021 15.3  13.0 - 17.0 g/dL Final  . HCT 04/05/2021 44.7  39.0 - 52.0 % Final  . MCV 04/05/2021 90.5  80.0 - 100.0 fL Final  . MCH 04/05/2021 31.0  26.0 - 34.0 pg Final  . MCHC 04/05/2021 34.2  30.0 - 36.0 g/dL Final  . RDW 04/05/2021 12.3  11.5 - 15.5 % Final  . Platelets 04/05/2021 135* 150 - 400 K/uL  Final  . nRBC 04/05/2021 0.0  0.0 - 0.2 % Final   Performed at [facility], [place] [address], [place], [place] [address]  . Sodium 04/05/2021 139  135 - 145 mmol/L Final  . Potassium 04/05/2021 4.1  3.5 - 5.1 mmol/L Final  . Chloride 04/05/2021 110  98 - 111 mmol/L Final  . CO2 04/05/2021 22  22 - 32 mmol/L Final  . Glucose, Bld 04/05/2021 115* 70 - 99 mg/dL Final   Glucose reference range applies only to samples taken after fasting for at least 8 hours.  . BUN 04/05/2021 20  8 - 23 mg/dL Final  . Creatinine, Ser 04/05/2021 0.69  0.61 -  1.24 mg/dL Final  . Calcium 04/05/2021 9.3  8.9 - 10.3 mg/dL Final  . Total Protein 04/05/2021 7.3  6.5 - 8.1 g/dL Final  . Albumin 04/05/2021 4.3  3.5 - 5.0 g/dL Final  . AST 04/05/2021 36  15 - 41 U/L Final  . ALT 04/05/2021 56* 0 - 44 U/L Final  . Alkaline Phosphatase 04/05/2021 71  38 - 126 U/L Final  . Total Bilirubin 04/05/2021 0.6  0.3 - 1.2 mg/dL Final  . GFR, Estimated 04/05/2021 >60  >60 mL/min Final   Comment: (NOTE) Calculated using the CKD-EPI Creatinine Equation (2021)   . Anion gap 04/05/2021 7  5 - 15 Final   Performed at [facility], [place] [address], [place], [place] [address]  . Prothrombin Time 04/05/2021 13.6  11.4 - 15.2 seconds Final  . INR 04/05/2021 1.1  0.8 - 1.2 Final   Comment: (NOTE) INR goal varies based on device and disease states. Performed at [facility], [place] [address] [place], [place] [address]   . aPTT 04/05/2021 30  24 - 36 seconds Final   Performed at [facility], [place] [address], [place], [place] [address]  . ABO/RH(D) 04/05/2021 O POS   Final  . Antibody Screen 04/05/2021 NEG   Final  . Sample Expiration 04/05/2021 04/18/2021,2359   Final  . Extend sample reason 04/05/2021    Final                   Value:NO TRANSFUSIONS OR PREGNANCY IN THE PAST 3 MONTHS Performed at [facility], [place] [address]  [place], [place] [address]   Admission on 03/12/2021, Discharged on 03/13/2021  Component Date Value Ref Range Status  . WBC 03/12/2021 4.6  4.0 - 10.5 K/uL Final  . RBC 03/12/2021 4.47  4.22 - 5.81 MIL/uL Final  . Hemoglobin 03/12/2021 13.8  13.0 - 17.0 g/dL Final  . HCT 03/12/2021 40.0  39.0 - 52.0 % Final  . MCV 03/12/2021 89.5  80.0 - 100.0 fL Final  . MCH 03/12/2021 30.9  26.0 - 34.0 pg Final  . MCHC 03/12/2021 34.5  30.0 - 36.0 g/dL Final  . RDW 03/12/2021 12.6  11.5 - 15.5 % Final  . Platelets 03/12/2021 128* 150 - 400 K/uL Final  . nRBC 03/12/2021 0.0  0.0 - 0.2 % Final  . Neutrophils Relative % 03/12/2021 76  % Final  . Neutro Abs 03/12/2021 3.5  1.7 - 7.7 K/uL Final  . Lymphocytes Relative 03/12/2021 12  % Final  . Lymphs Abs 03/12/2021 0.5* 0.7 - 4.0 K/uL Final  . Monocytes Relative 03/12/2021 10  % Final  . Monocytes Absolute 03/12/2021 0.5  0.1 - 1.0 K/uL Final  . Eosinophils Relative 03/12/2021 2  % Final  . Eosinophils Absolute 03/12/2021 0.1  0.0 - 0.5 K/uL Final  . Basophils Relative 03/12/2021 0  % Final  . Basophils Absolute 03/12/2021 0.0  0.0 - 0.1 K/uL Final  . Immature Granulocytes 03/12/2021 0  % Final  . Abs Immature Granulocytes 03/12/2021 0.02  0.00 - 0.07 K/uL Final   Performed at [facility], [place] [address], [place], [place] [address]  . Sodium 03/12/2021 142  135 - 145 mmol/L Final  . Potassium 03/12/2021 3.7  3.5 - 5.1 mmol/L Final  . Chloride 03/12/2021 110  98 - 111 mmol/L Final  . CO2 03/12/2021 23  22 - 32 mmol/L Final  . Glucose, Bld 03/12/2021 132* 70 -  99 mg/dL Final   Glucose reference range applies only to samples taken after fasting for at least 8 hours.  . BUN 03/12/2021 15  8 - 23 mg/dL Final  . Creatinine, Ser 03/12/2021 0.77  0.61 - 1.24 mg/dL Final  . Calcium 03/12/2021 9.5  8.9 - 10.3 mg/dL Final  . GFR, Estimated 03/12/2021 >60  >60 mL/min Final   Comment: (NOTE) Calculated using the CKD-EPI Creatinine Equation  (2021)   . Anion gap 03/12/2021 9  5 - 15 Final   Performed at [facility], [place] [address], [place], [place] [address]  . SARS Coronavirus 2 by RT PCR 03/12/2021 NEGATIVE  NEGATIVE Final   Comment: (NOTE) SARS-CoV-2 target nucleic acids are NOT DETECTED.  The SARS-CoV-2 RNA is generally detectable in upper respiratory specimens during the acute phase of infection. The lowest concentration of SARS-CoV-2 viral copies this assay can detect is 138 copies/mL. A negative result does not preclude SARS-Cov-2 infection and should not be used as the sole basis for treatment or other patient management decisions. A negative result may occur with  improper specimen collection/handling, submission of specimen other than nasopharyngeal swab, presence of viral mutation(s) within the areas targeted by this assay, and inadequate number of viral copies(<138 copies/mL). A negative result must be combined with clinical observations, patient history, and epidemiological information. The expected result is Negative.  Fact Sheet for Patients:  EntrepreneurPulse.com.au  Fact Sheet for Healthcare Providers:  IncredibleEmployment.be  This test is no                          t yet approved or cleared by the [place] FDA and  has been authorized for detection and/or diagnosis of SARS-CoV-2 by FDA under an Emergency Use Authorization (EUA). This EUA will remain  in effect (meaning this test can be used) for the duration of the COVID-19 declaration under Section 564(b)(1) of the Act, 21 U.S.C.section 360bbb-3(b)(1), unless the authorization is terminated  or revoked sooner.      . Influenza A by PCR 03/12/2021 NEGATIVE  NEGATIVE Final  . Influenza B by PCR 03/12/2021 NEGATIVE  NEGATIVE Final   Comment: (NOTE) The Xpert Xpress SARS-CoV-2/FLU/RSV plus assay is intended as an aid in the diagnosis of influenza from Nasopharyngeal swab specimens  and should not be used as a sole basis for treatment. Nasal washings and aspirates are unacceptable for Xpert Xpress SARS-CoV-2/FLU/RSV testing.  Fact Sheet for Patients: EntrepreneurPulse.com.au  Fact Sheet for Healthcare Providers: IncredibleEmployment.be  This test is not yet approved or cleared by the [place] FDA and has been authorized for detection and/or diagnosis of SARS-CoV-2 by FDA under an Emergency Use Authorization (EUA). This EUA will remain in effect (meaning this test can be used) for the duration of the COVID-19 declaration under Section 564(b)(1) of the Act, 21 U.S.C. section 360bbb-3(b)(1), unless the authorization is terminated or revoked.  Performed at [facility], [place] [address], [place], [place] [address]   . Prostatic Specific Antigen 03/12/2021 10.67* 0.00 - 4.00 ng/mL Final   Comment: (NOTE) While PSA levels of <=4.0 ng/ml are reported as reference range, some men with levels below 4.0 ng/ml can have prostate cancer and many men with PSA above 4.0 ng/ml do not have prostate cancer.  Other tests such as free PSA, age specific reference ranges, PSA velocity and PSA doubling time may be helpful especially in men less than 41 years old. Performed at [facility], [place] [address], [place], [place] [address]   . HIV Screen 4th Generation wRfx 03/12/2021 Non Reactive  Non Reactive Final   Performed at [facility], [place] [address] [place], [place] [address]  . Sodium 03/13/2021 141  135 - 145 mmol/L Final  . Potassium 03/13/2021 3.7  3.5 - 5.1 mmol/L Final  . Chloride 03/13/2021 108  98 - 111 mmol/L Final  . CO2 03/13/2021 25  22 - 32 mmol/L Final  . Glucose, Bld 03/13/2021 107* 70 - 99 mg/dL Final   Glucose reference range applies only to samples taken after fasting for at least 8 hours.  . BUN 03/13/2021 12  8 - 23 mg/dL Final  . Creatinine, Ser 03/13/2021 0.91  0.61 -  1.24 mg/dL Final  . Calcium 03/13/2021 9.0  8.9 - 10.3 mg/dL Final  . GFR, Estimated 03/13/2021 >60  >60 mL/min Final   Comment: (NOTE) Calculated using the CKD-EPI Creatinine Equation (2021)   . Anion gap 03/13/2021 8  5 - 15 Final   Performed at [facility], [place] [address] [place], [place] [address]  . WBC 03/13/2021 5.3  4.0 - 10.5 K/uL Final  . RBC 03/13/2021 4.27  4.22 - 5.81 MIL/uL Final  . Hemoglobin 03/13/2021 13.4  13.0 - 17.0 g/dL Final  . HCT 03/13/2021 39.3  39.0 - 52.0 % Final  . MCV 03/13/2021 92.0  80.0 - 100.0 fL Final  . MCH 03/13/2021 31.4  26.0 - 34.0 pg Final  . MCHC 03/13/2021 34.1  30.0 - 36.0 g/dL Final  . RDW 03/13/2021 12.9  11.5 - 15.5 % Final  . Platelets 03/13/2021 126* 150 - 400 K/uL Final   Comment: SPECIMEN CHECKED FOR CLOTS Immature Platelet Fraction may be clinically indicated, consider ordering this additional test [id number] CONSISTENT WITH PREVIOUS RESULT   . nRBC 03/13/2021 0.0  0.0 - 0.2 % Final   Performed at [facility] [address], [place], [place] [address]  . Hgb A1c MFr Bld 03/12/2021 5.3  4.8 - 5.6 % Final   Comment: (NOTE) Pre diabetes:          5.7%-6.4%  Diabetes:              >6.4%  Glycemic control for   <7.0% adults with diabetes   . Mean Plasma Glucose 03/12/2021 105.41  mg/dL Final   Performed at [facility] [address], [place],  [address]  . Glucose-Capillary 03/12/2021 91  70 - 99 mg/dL Final   Glucose reference range applies only to samples taken after fasting for at least 8 hours.  . Glucose-Capillary 03/13/2021 103* 70 - 99 mg/dL Final   Glucose reference range applies only to samples taken after fasting for at least 8 hours.     X-Rays:DG Pelvis Portable  Result Date: 04/15/2021 CLINICAL DATA:  Post RIGHT hip prosthesis surface revision EXAM: PORTABLE PELVIS 1-2 VIEWS COMPARISON:  Portable exam 1617 hours compared to 01/04/2020 FINDINGS: BILATERAL hip  prostheses again identified. No fracture, dislocation, or bone destruction. Tip of RIGHT femoral component not imaged. IMPRESSION: BILATERAL hip prostheses. No acute abnormalities. Electronically Signed   By: Lavonia Dana M.D.   On: 04/15/2021 16:40    EKG: Orders placed or performed in visit on 01/01/21  . EKG 12-Lead     Hospital Course: [name] is a 70 y.o. who was admitted to [facility]. They were brought to the operating room on 04/15/2021  and underwent Procedure(s): Right hip bearing surface revision.  Patient tolerated the procedure well and was later transferred to the recovery room and then to the orthopaedic floor for postoperative care. They were given PO and IV analgesics for pain control following their surgery. They were given 24 hours of postoperative antibiotics of  Anti-infectives (From admission, onward)   Start     Dose/Rate Route Frequency Ordered Stop   04/15/21 2030  ceFAZolin (ANCEF) IVPB 2g/100 mL premix        2 g 200 mL/hr over 30 Minutes Intravenous Every 6 hours 04/15/21 1844 04/16/21 0259   04/15/21 1130  ceFAZolin (ANCEF) IVPB 2g/100 mL premix        2 g 200 mL/hr over 30 Minutes Intravenous On call to O.R. 04/15/21 1127 04/15/21 1433     and started on DVT prophylaxis in the form of Aspirin and Plavix.   PT and OT were ordered for total joint protocol. Discharge planning consulted to help with postop disposition and equipment needs.  Patient had a good night on the evening of surgery. They started to get up OOB with therapy on POD #1. Pt was seen during rounds and was ready to go home pending progress with therapy. He worked with therapy on POD #1 and was meeting his goals. Pt was discharged to home later that day in stable condition.  Diet: Cardiac diet Activity: WBAT; posterior hip precautions Follow-up: in 2 weeks Disposition: Home Discharged Condition: stable   Discharge Instructions    Call MD / Call 911   Complete by: As directed     If you experience chest pain or shortness of breath, CALL 911 and be transported to the hospital emergency room.  If you develope a fever above 101 F, pus (white drainage) or increased drainage or redness at the wound, or calf pain, call your surgeon's office.   Change dressing   Complete by: As directed    You have an adhesive waterproof bandage over the incision. Leave this in place until your first follow-up appointment. Once you remove this you will not need to place another bandage.   Constipation Prevention   Complete by: As directed    Drink plenty of fluids.  Prune juice may be helpful.  You may use a stool softener, such as Colace (over the counter) 100 mg twice a day.  Use MiraLax (over the counter) for constipation as needed.   Diet - low sodium heart healthy   Complete by: As directed    Do not sit on low chairs, stoools or toilet seats, as it may be difficult to get up from low surfaces   Complete by: As directed    Driving restrictions   Complete by: As directed    No driving for two weeks   Follow the hip precautions as taught in Physical Therapy   Complete by: As directed    TED hose   Complete by: As directed    Use stockings (TED hose) for three weeks on both leg(s).  You may remove them at night for sleeping.   Weight bearing as tolerated   Complete by: As directed      Allergies as of 04/16/2021   No Known Allergies     Medication List    TAKE these medications   aspirin 325 MG EC tablet Take a 325 mg aspirin once a day with your usual Plavix dose for 3 weeks following surgery. Then resume one 81 mg aspirin once a day  with your usual Plavix dose. What changed:   medication strength  how much to take  how to take this  when to take this  additional instructions   atorvastatin 80 MG tablet Commonly known as: LIPITOR TAKE 1 TABLET (80 MG TOTAL) BY MOUTH DAILY AT 6 PM.   cholecalciferol 25 MCG (1000 UNIT) tablet Commonly known as: VITAMIN D3 Take 1,000  Units by mouth daily.   clopidogrel 75 MG tablet Commonly known as: PLAVIX TAKE 1 TABLET BY MOUTH EVERY DAY   HYDROcodone-acetaminophen 5-325 MG tablet Commonly known as: NORCO/VICODIN Take 1-2 tablets by mouth every 6 (six) hours as needed for severe pain.   levothyroxine 150 MCG tablet Commonly known as: SYNTHROID TAKE 1 TABLET BY MOUTH EVERY DAY BEFORE BREAKFAST   metFORMIN 500 MG tablet Commonly known as: GLUCOPHAGE Take 1 tablet (500 mg total) by mouth daily with breakfast.   methocarbamol 500 MG tablet Commonly known as: ROBAXIN Take 1 tablet (500 mg total) by mouth every 6 (six) hours as needed for muscle spasms.   metoprolol succinate 25 MG 24 hr tablet Commonly known as: TOPROL-XL Take 1 tablet (25 mg total) by mouth daily.   nitroGLYCERIN 0.4 MG SL tablet Commonly known as: NITROSTAT PLACE 1 TABLET (0.4 MG TOTAL) UNDER THE TONGUE EVERY 5 (FIVE) MINUTES AS NEEDED FOR CHEST PAIN.   tamsulosin 0.4 MG Caps capsule Commonly known as: FLOMAX Take 0.4 mg by mouth daily.   telmisartan 80 MG tablet Commonly known as: MICARDIS TAKE 1 TABLET BY MOUTH EVERY DAY   traMADol 50 MG tablet Commonly known as: ULTRAM Take 1-2 tablets (50-100 mg total) by mouth every 6 (six) hours as needed for moderate pain.   Vitamin D (Ergocalciferol) 1.25 MG (50000 UNIT) Caps capsule Commonly known as: DRISDOL Take 1 capsule (50,000 Units total) by mouth every 7 (seven) days.            Discharge Care Instructions  (From admission, onward)         Start     Ordered   04/16/21 0000  Weight bearing as tolerated        04/16/21 0736   04/16/21 0000  Change dressing       Comments: You have an adhesive waterproof bandage over the incision. Leave this in place until your first follow-up appointment. Once you remove this you will not need to place another bandage.   04/16/21 0736          Follow-up Information    Gaynelle Arabian, MD. Schedule an appointment as soon as possible  for a visit on 04/30/2021.   Specialty: Orthopedic Surgery Contact information: [address] [place] [address] [phone]               Signed: Theresa Duty, PA-C Orthopedic Surgery 04/17/2021, 12:04 PM

## 2021-04-18 ENCOUNTER — Ambulatory Visit (INDEPENDENT_AMBULATORY_CARE_PROVIDER_SITE_OTHER): Payer: Medicare Other | Admitting: Family Medicine

## 2021-04-22 ENCOUNTER — Other Ambulatory Visit: Payer: Self-pay

## 2021-04-22 ENCOUNTER — Encounter (INDEPENDENT_AMBULATORY_CARE_PROVIDER_SITE_OTHER): Payer: Self-pay | Admitting: Family Medicine

## 2021-04-22 ENCOUNTER — Ambulatory Visit (INDEPENDENT_AMBULATORY_CARE_PROVIDER_SITE_OTHER): Payer: Medicare Other | Admitting: Family Medicine

## 2021-04-22 VITALS — BP 100/66 | HR 76 | Temp 98.0°F | Ht 71.0 in | Wt 242.0 lb

## 2021-04-22 DIAGNOSIS — E7849 Other hyperlipidemia: Secondary | ICD-10-CM | POA: Diagnosis not present

## 2021-04-22 DIAGNOSIS — E559 Vitamin D deficiency, unspecified: Secondary | ICD-10-CM | POA: Diagnosis not present

## 2021-04-22 DIAGNOSIS — R7303 Prediabetes: Secondary | ICD-10-CM | POA: Diagnosis not present

## 2021-04-22 DIAGNOSIS — Z6837 Body mass index (BMI) 37.0-37.9, adult: Secondary | ICD-10-CM

## 2021-04-22 DIAGNOSIS — E039 Hypothyroidism, unspecified: Secondary | ICD-10-CM

## 2021-04-22 DIAGNOSIS — E785 Hyperlipidemia, unspecified: Secondary | ICD-10-CM | POA: Diagnosis not present

## 2021-04-22 MED ORDER — VITAMIN D (ERGOCALCIFEROL) 1.25 MG (50000 UNIT) PO CAPS: 50000.0000 [IU] | ORAL_CAPSULE | ORAL | 0 refills | Status: DC

## 2021-04-23 ENCOUNTER — Encounter (INDEPENDENT_AMBULATORY_CARE_PROVIDER_SITE_OTHER): Payer: Self-pay | Admitting: Family Medicine

## 2021-04-23 LAB — LIPID PANEL WITH LDL/HDL RATIO
Cholesterol, Total: 117 mg/dL (ref 100–199)
HDL: 41 mg/dL (ref >39)
LDL Chol Calc (NIH): 61 mg/dL (ref 0–99)
LDL/HDL Ratio: 1.5 ratio (ref 0.0–3.6)
Triglycerides: 72 mg/dL (ref 0–149)
VLDL Cholesterol Cal: 15 mg/dL (ref 5–40)

## 2021-04-23 LAB — CMP14+EGFR
ALT: 34 IU/L (ref 0–44)
AST: 31 IU/L (ref 0–40)
Albumin/Globulin Ratio: 1.3 (ref 1.2–2.2)
Albumin: 4 g/dL (ref 3.8–4.8)
Alkaline Phosphatase: 99 IU/L (ref 44–121)
BUN/Creatinine Ratio: 19 (ref 10–24)
BUN: 16 mg/dL (ref 8–27)
Bilirubin Total: 1.1 mg/dL (ref 0.0–1.2)
CO2: 21 mmol/L (ref 20–29)
Calcium: 9.3 mg/dL (ref 8.6–10.2)
Chloride: 106 mmol/L (ref 96–106)
Creatinine, Ser: 0.84 mg/dL (ref 0.76–1.27)
Globulin, Total: 3.2 g/dL (ref 1.5–4.5)
Glucose: 113 mg/dL — ABNORMAL HIGH (ref 65–99)
Potassium: 4.3 mmol/L (ref 3.5–5.2)
Sodium: 142 mmol/L (ref 134–144)
Total Protein: 7.2 g/dL (ref 6.0–8.5)
eGFR: 96 mL/min/{1.73_m2} (ref >59)

## 2021-04-23 LAB — VITAMIN D 25 HYDROXY (VIT D DEFICIENCY, FRACTURES): Vit D, 25-Hydroxy: 67 ng/mL (ref 30.0–100.0)

## 2021-04-23 LAB — TSH: TSH: 0.581 u[IU]/mL (ref 0.450–4.500)

## 2021-04-23 LAB — T4, FREE: Free T4: 1.54 ng/dL (ref 0.82–1.77)

## 2021-04-23 LAB — INSULIN, RANDOM: INSULIN: 19.1 u[IU]/mL (ref 2.6–24.9)

## 2021-04-23 LAB — T3: T3, Total: 105 ng/dL (ref 71–180)

## 2021-04-23 NOTE — Progress Notes (Signed)
Chief Complaint:   OBESITY [name] is here to discuss his progress with his obesity treatment plan along with follow-up of his obesity related diagnoses. [name] is on the Category 3 Plan and states he is following his eating plan approximately 80% of the time. [name] states he is doing 0 minutes 0 times per week.  Today's visit was #: 7 Starting weight: 266 lbs Starting date: 01/01/2021 Today's weight: 242 lbs Today's date: 04/22/2021 Total lbs lost to date: 24 Total lbs lost since last in-office visit: 2  Interim History: [name] had hip surgery last week and he is doing well overall. He is getting back to his normal routine but he is not pushing himself too hard. He is working on increasing protein and meal planning.  Subjective:   1. Other hyperlipidemia [name] is working on diet and weight loss. He is due for labs.  2. Vitamin D deficiency [name] is on Vit D prescription, and he is due for labs.  3. Pre-diabetes [name] is working on diet, exercise, and weight loss. He is due for labs.  4. Acquired hypothyroidism [name] is on Synthroid and he feels well overall. He continues to lose weight and may need his dose adjusted.  Assessment/Plan:   1. Other hyperlipidemia Cardiovascular risk and specific lipid/LDL goals reviewed. We discussed several lifestyle modifications today. We will check labs today. Kache will continue to work on diet, exercise and weight loss efforts. Orders and follow up as documented in patient record.   - Lipid Panel With LDL/HDL Ratio  2. Vitamin D deficiency Low Vitamin D level contributes to fatigue and are associated with obesity, breast, and colon cancer. We will check labs today, and we will refill prescription Vitamin D for 1 month. [name] will follow-up for routine testing of Vitamin D, at least 2-3 times per year to avoid over-replacement.  - Vitamin D, Ergocalciferol, (DRISDOL) 1.25 MG (50000 UNIT) CAPS capsule; Take 1 capsule  (50,000 Units total) by mouth every 7 (seven) days.  Dispense: 4 capsule; Refill: 0 - VITAMIN D 25 Hydroxy (Vit-D Deficiency, Fractures)  3. Pre-diabetes [name] will continue to work on weight loss, diet, exercise, and decreasing simple carbohydrates to help decrease the risk of diabetes.  We will check labs today.  - Insulin, random  4. Acquired hypothyroidism We will check labs today, and [name] will continue to follow up as directed. Orders and follow up as documented in patient record.  - CMP14+EGFR - TSH - T4, free - T3  5. Class 2 severe obesity with serious comorbidity and body mass index (BMI) of 37.0 to 37.9 in adult, unspecified obesity type (HCC) [name] is currently in the action stage of change. As such, his goal is to continue with weight loss efforts. He has agreed to the Category 3 Plan.   Behavioral modification strategies: increasing lean protein intake and travel eating strategies.  [name] has agreed to follow-up with our clinic in 4 weeks. He was informed of the importance of frequent follow-up visits to maximize his success with intensive lifestyle modifications for his multiple health conditions.   [name] was informed we would discuss his lab results at his next visit unless there is a critical issue that needs to be addressed sooner. [name] agreed to keep his next visit at the agreed upon time to discuss these results.  Objective:   Blood pressure 100/66, pulse 76, temperature 98 F (36.7 C), height '5\' 11"'  (1.803 m), weight 242 lb (109.8 kg), SpO2 98 %. Body  mass index is 33.75 kg/m.  General: Cooperative, alert, well developed, in no acute distress. HEENT: Conjunctivae and lids unremarkable. Cardiovascular: Regular rhythm.  Lungs: Normal work of breathing. Neurologic: No focal deficits.   Lab Results  Component Value Date   CREATININE 0.84 04/22/2021   BUN 16 04/22/2021   NA 142 04/22/2021   K 4.3 04/22/2021   CL 106 04/22/2021   CO2 21  04/22/2021   Lab Results  Component Value Date   ALT 34 04/22/2021   AST 31 04/22/2021   ALKPHOS 99 04/22/2021   BILITOT 1.1 04/22/2021   Lab Results  Component Value Date   HGBA1C 5.3 03/12/2021   HGBA1C 6.0 12/11/2020   HGBA1C 5.6 12/29/2019   HGBA1C 6.1 11/03/2019   HGBA1C 5.4 12/31/2018   Lab Results  Component Value Date   INSULIN 19.1 04/22/2021   INSULIN 34.7 (H) 01/01/2021   Lab Results  Component Value Date   TSH 0.581 04/22/2021   Lab Results  Component Value Date   CHOL 117 04/22/2021   HDL 41 04/22/2021   LDLCALC 61 04/22/2021   LDLDIRECT 153.1 06/09/2011   TRIG 72 04/22/2021   CHOLHDL 3 12/11/2020   Lab Results  Component Value Date   WBC 7.0 04/16/2021   HGB 13.8 04/16/2021   HCT 40.4 04/16/2021   MCV 90.4 04/16/2021   PLT 124 (L) 04/16/2021   No results found for: IRON, TIBC, FERRITIN  Obesity Behavioral Intervention:   Approximately 15 minutes were spent on the discussion below.  ASK: We discussed the diagnosis of obesity with [name] today and [name] agreed to give [place] permission to discuss obesity behavioral modification therapy today.  ASSESS: [name] has the diagnosis of obesity and his BMI today is 33.77. [name] is in the action stage of change.   ADVISE: [name] was educated on the multiple health risks of obesity as well as the benefit of weight loss to improve his health. He was advised of the need for long term treatment and the importance of lifestyle modifications to improve his current health and to decrease his risk of future health problems.  AGREE: Multiple dietary modification options and treatment options were discussed and [name] agreed to follow the recommendations documented in the above note.  ARRANGE: [name] was educated on the importance of frequent visits to treat obesity as outlined per CMS and USPSTF guidelines and agreed to schedule his next follow up appointment today.  Attestation Statements:   Reviewed  by clinician on day of visit: allergies, medications, problem list, medical history, surgical history, family history, social history, and previous encounter notes.   I, Trixie Dredge, am acting as transcriptionist for Dennard Nip, MD.  I have reviewed the above documentation for accuracy and completeness, and I agree with the above. -  Dennard Nip, MD

## 2021-05-07 ENCOUNTER — Encounter (INDEPENDENT_AMBULATORY_CARE_PROVIDER_SITE_OTHER): Payer: Self-pay | Admitting: Adult Health

## 2021-05-07 ENCOUNTER — Ambulatory Visit (INDEPENDENT_AMBULATORY_CARE_PROVIDER_SITE_OTHER): Payer: Medicare Other | Admitting: Adult Health

## 2021-05-07 ENCOUNTER — Other Ambulatory Visit: Payer: Self-pay

## 2021-05-07 VITALS — BP 100/65 | HR 59 | Temp 97.1°F | Ht 71.0 in | Wt 240.0 lb

## 2021-05-07 DIAGNOSIS — E785 Hyperlipidemia, unspecified: Secondary | ICD-10-CM

## 2021-05-07 DIAGNOSIS — E559 Vitamin D deficiency, unspecified: Secondary | ICD-10-CM

## 2021-05-07 DIAGNOSIS — R7303 Prediabetes: Secondary | ICD-10-CM

## 2021-05-07 DIAGNOSIS — Z6837 Body mass index (BMI) 37.0-37.9, adult: Secondary | ICD-10-CM | POA: Diagnosis not present

## 2021-05-07 DIAGNOSIS — I25750 Atherosclerosis of native coronary artery of transplanted heart with unstable angina: Secondary | ICD-10-CM

## 2021-05-07 MED ORDER — METFORMIN HCL 500 MG PO TABS: 500.0000 mg | ORAL_TABLET | Freq: Every day | ORAL | 0 refills | Status: DC

## 2021-05-07 NOTE — Progress Notes (Signed)
Chief Complaint:   OBESITY [name] is here to discuss his progress with his obesity treatment plan along with follow-up of his obesity related diagnoses. [name] is on the Category 3 Plan and states he is following his eating plan approximately 85% of the time. [name] states he is doing yard work.  Today's visit was #: 8 Starting weight: 266 lbs Starting date: 01/01/2021 Today's weight: 240 lbs Today's date: 05/07/2021 Total lbs lost to date: 26 Total lbs lost since last in-office visit: 2  Interim History: On 04/15/2021, [name] had R hip bearing surface revision. He is not in outpatient PT. Ultimate goal is to lose 50 lbs within 12 months of starting the program.   Subjective:   1. Vitamin D deficiency Discussed labs with patient today. [name]'s Vitamin D level was at goal at  67.0 on 04/22/2021. He is currently taking prescription vitamin D 50,000 IU each week and OTC Vit D 1,000 IU daily.Marland Kitchen He denies nausea, vomiting or muscle weakness.  2. Pre-diabetes Discussed labs with patient today.  11/03/2019 A1c 6.1  04/22/2021 BG 113 and insulin level 19.1- both above goal. Multiple elevated BG readings in EPIC. He is on Metformin 500 mg every breakfast. He denies GI upset.  Lab Results  Component Value Date   HGBA1C 5.3 03/12/2021   Lab Results  Component Value Date   INSULIN 19.1 04/22/2021   INSULIN 34.7 (H) 01/01/2021   3. Dyslipidemia Discussed labs with patient today. 04/22/2021 lipid panel- all level at goal. [name] is on atorvastatin 80 mg QD. He is managed by cardiology.  Lab Results  Component Value Date   ALT 34 04/22/2021   AST 31 04/22/2021   ALKPHOS 99 04/22/2021   BILITOT 1.1 04/22/2021   Lab Results  Component Value Date   CHOL 117 04/22/2021   HDL 41 04/22/2021   LDLCALC 61 04/22/2021   LDLDIRECT 153.1 06/09/2011   TRIG 72 04/22/2021   CHOLHDL 3 12/11/2020    Assessment/Plan:   1. Vitamin D deficiency Low Vitamin D level contributes to fatigue  and are associated with obesity, breast, and colon cancer. He agrees to complete current prescription of Vitamin D @50 ,000 IU every week, then stop. Continue OTC Vit D3 1,000 IU daily  and will follow-up for routine testing of Vitamin D, at least 2-3 times per year to avoid over-replacement.  2. Pre-diabetes [name] will continue to work on weight loss, exercise, and decreasing simple carbohydrates to help decrease the risk of diabetes.   - metFORMIN (GLUCOPHAGE) 500 MG tablet; Take 1 tablet (500 mg total) by mouth daily with breakfast.  Dispense: 90 tablet; Refill: 0  3. Dyslipidemia Cardiovascular risk and specific lipid/LDL goals reviewed.  We discussed several lifestyle modifications today and [name] will continue to work on diet, exercise and weight loss efforts. Orders and follow up as documented in patient record. Continue current statin therapy as directed.  Counseling Intensive lifestyle modifications are the first line treatment for this issue. . Dietary changes: Increase soluble fiber. Decrease simple carbohydrates. . Exercise changes: Moderate to vigorous-intensity aerobic activity 150 minutes per week if tolerated. . Lipid-lowering medications: see documented in medical record.  4. Obesity with current BMI 33.5  [name] is currently in the action stage of change. As such, his goal is to continue with weight loss efforts. He has agreed to the Category 3 Plan.   Exercise goals: As is  Behavioral modification strategies: increasing lean protein intake, decreasing simple carbohydrates, meal planning and cooking strategies  and planning for success.  [name] has agreed to follow-up with our clinic in 3 weeks. He was informed of the importance of frequent follow-up visits to maximize his success with intensive lifestyle modifications for his multiple health conditions.   Objective:   Blood pressure 100/65, pulse (!) 59, temperature (!) 97.1 F (36.2 C), height 5\' 11"  (1.803 m),  weight 240 lb (108.9 kg), SpO2 97 %. Body mass index is 33.47 kg/m.  General: Cooperative, alert, well developed, in no acute distress. HEENT: Conjunctivae and lids unremarkable. Cardiovascular: Regular rhythm.  Lungs: Normal work of breathing. Neurologic: No focal deficits.   Lab Results  Component Value Date   CREATININE 0.84 04/22/2021   BUN 16 04/22/2021   NA 142 04/22/2021   K 4.3 04/22/2021   CL 106 04/22/2021   CO2 21 04/22/2021   Lab Results  Component Value Date   ALT 34 04/22/2021   AST 31 04/22/2021   ALKPHOS 99 04/22/2021   BILITOT 1.1 04/22/2021   Lab Results  Component Value Date   HGBA1C 5.3 03/12/2021   HGBA1C 6.0 12/11/2020   HGBA1C 5.6 12/29/2019   HGBA1C 6.1 11/03/2019   HGBA1C 5.4 12/31/2018   Lab Results  Component Value Date   INSULIN 19.1 04/22/2021   INSULIN 34.7 (H) 01/01/2021   Lab Results  Component Value Date   TSH 0.581 04/22/2021   Lab Results  Component Value Date   CHOL 117 04/22/2021   HDL 41 04/22/2021   LDLCALC 61 04/22/2021   LDLDIRECT 153.1 06/09/2011   TRIG 72 04/22/2021   CHOLHDL 3 12/11/2020   Lab Results  Component Value Date   WBC 7.0 04/16/2021   HGB 13.8 04/16/2021   HCT 40.4 04/16/2021   MCV 90.4 04/16/2021   PLT 124 (L) 04/16/2021   No results found for: IRON, TIBC, FERRITIN  Obesity Behavioral Intervention:   Approximately 15 minutes were spent on the discussion below.  ASK: We discussed the diagnosis of obesity with [name] today and [name] agreed to give [place] permission to discuss obesity behavioral modification therapy today.  ASSESS: [name] has the diagnosis of obesity and his BMI today is 33.5. [name] is in the action stage of change.   ADVISE: [name] was educated on the multiple health risks of obesity as well as the benefit of weight loss to improve his health. He was advised of the need for long term treatment and the importance of lifestyle modifications to improve his current health and  to decrease his risk of future health problems.  AGREE: Multiple dietary modification options and treatment options were discussed and [name] agreed to follow the recommendations documented in the above note.  ARRANGE: [name] was educated on the importance of frequent visits to treat obesity as outlined per CMS and USPSTF guidelines and agreed to schedule his next follow up appointment today.  Attestation Statements:   Reviewed by clinician on day of visit: allergies, medications, problem list, medical history, surgical history, family history, social history, and previous encounter notes.  Coral Ceo, CMA, am acting as transcriptionist for Mina Marble, NP.  I have reviewed the above documentation for accuracy and completeness, and I agree with the above. -  Gildardo Tickner d. Donnelle Rubey, NP-C

## 2021-05-14 ENCOUNTER — Telehealth: Payer: Medicare Other

## 2021-05-20 ENCOUNTER — Ambulatory Visit (INDEPENDENT_AMBULATORY_CARE_PROVIDER_SITE_OTHER): Payer: Medicare Other | Admitting: Family Medicine

## 2021-05-20 ENCOUNTER — Ambulatory Visit (INDEPENDENT_AMBULATORY_CARE_PROVIDER_SITE_OTHER): Payer: Medicare Other | Admitting: Cardiology

## 2021-05-20 ENCOUNTER — Encounter: Payer: Self-pay | Admitting: Cardiology

## 2021-05-20 ENCOUNTER — Other Ambulatory Visit: Payer: Self-pay

## 2021-05-20 VITALS — BP 128/72 | HR 52 | Ht 72.0 in | Wt 250.8 lb

## 2021-05-20 DIAGNOSIS — E7800 Pure hypercholesterolemia, unspecified: Secondary | ICD-10-CM | POA: Diagnosis not present

## 2021-05-20 DIAGNOSIS — G4733 Obstructive sleep apnea (adult) (pediatric): Secondary | ICD-10-CM

## 2021-05-20 DIAGNOSIS — I25750 Atherosclerosis of native coronary artery of transplanted heart with unstable angina: Secondary | ICD-10-CM | POA: Diagnosis not present

## 2021-05-20 DIAGNOSIS — I10 Essential (primary) hypertension: Secondary | ICD-10-CM | POA: Diagnosis not present

## 2021-05-20 DIAGNOSIS — I2510 Atherosclerotic heart disease of native coronary artery without angina pectoris: Secondary | ICD-10-CM | POA: Diagnosis not present

## 2021-05-20 MED ORDER — CLOPIDOGREL BISULFATE 75 MG PO TABS
1.0000 | ORAL_TABLET | Freq: Every day | ORAL | 3 refills | Status: DC
Start: 2021-05-20 — End: 2022-05-20

## 2021-05-20 MED ORDER — METOPROLOL SUCCINATE ER 25 MG PO TB24: 25.0000 mg | ORAL_TABLET | Freq: Every day | ORAL | 3 refills | Status: DC

## 2021-05-20 MED ORDER — ATORVASTATIN CALCIUM 80 MG PO TABS: 80.0000 mg | ORAL_TABLET | Freq: Every day | ORAL | 3 refills | Status: DC

## 2021-05-20 MED ORDER — TELMISARTAN 80 MG PO TABS: 80.0000 mg | ORAL_TABLET | Freq: Every day | ORAL | 3 refills | Status: DC

## 2021-05-20 NOTE — Progress Notes (Signed)
Cardiology Office Note:    Date:  05/20/2021   ID:  [name], DOB 1951/03/25, MRN [id number]  PCP:  Binnie Rail, MD  Cardiologist:  Fransico Him, MD    Referring MD: Binnie Rail, MD   Chief Complaint  Patient presents with  . Coronary Artery Disease  . Hypertension  . Sleep Apnea  . Hyperlipidemia    History of Present Illness:    [name] is a 70 y.o. male with a hx of ASCAD s/p inferior STEMI with cath showing 95% PDA that was felt to be the culprit vessel. This was treated with PCI-DES. Also noted was CTO CFX with collaterals from the PDA on OM1. There was an unsuccessful attempt at opening the CFX.Also has 70% mid LAD lesion, to be treated medically.EF was 60% with inferobasilar HK. Echo showed EF 65% with no WMA, grade 1 DD.He also has a hx of OSA on CPAP, obesity, HTN and PAF on anticoagulation.    He is here today for followup and is doing well.  He denies any chest pain or pressure, SOB, DOE, PND, orthopnea, LE edema, dizziness, palpitations or syncope. He is compliant with his meds and is tolerating meds with no SE.    He is doing well with his BiPAP device and thinks that he has gotten used to it.  He tolerates the mask and feels the pressure is adequate. Since going on PAP his feels rested in the am if he sleeps well the night before and has no significant daytime sleepiness.  He denies any significant mouth or nasal dryness or nasal congestion.  He does not think that he snores.     Past Medical History:  Diagnosis Date  . Back pain   . BPH (benign prostatic hypertrophy)   . Chest pain   . COLONIC POLYPS, HX OF 2007   clear colo w/o polyps 04/2015: 5yr follow up  . Constipation   . Coronary artery disease   . Decreased mobility    due to BL knee and hip replacement  . Fatigue   . GERD (gastroesophageal reflux disease)   . Heartburn   . History of kidney stones    passed  . Hyperlipidemia   . HYPERTENSION   . HYPOTHYROIDISM   . Joint pain    . Myocardial infarction ([place]) 12/2018   Inferior STEMI  . OA (osteoarthritis)    Knees  . OBESITY   . OSA treated with BiPAP   . PAF (paroxysmal atrial fibrillation) ([place])   . Pre-diabetes   . Shortness of breath on exertion   . Umbilical hernia   . Ventral hernia     Past Surgical History:  Procedure Laterality Date  . arthroscopic knee surgery     (R) 2003 & (L) 2010  . CARDIAC CATHETERIZATION    . COLONOSCOPY  04/2015   no polyps ([place])  . CORONARY/GRAFT ACUTE MI REVASCULARIZATION N/A 12/30/2018   Procedure: Coronary/Graft Acute MI Revascularization;  Surgeon: Belva Crome, MD;  Location: [facility];  Service: Cardiovascular;  Laterality: N/A;  . HIP CLOSED REDUCTION Right 03/12/2021   Procedure: CLOSED REDUCTION HIP;  Surgeon: Melina Schools, MD;  Location: [facility];  Service: Orthopedics;  Laterality: Right;  . LAMINECTOMY  1991  . LEFT HEART CATH AND CORONARY ANGIOGRAPHY N/A 12/30/2018   Procedure: LEFT HEART CATH AND CORONARY ANGIOGRAPHY;  Surgeon: Belva Crome, MD;  Location: [facility];  Service: Cardiovascular;  Laterality: N/A;  .  TONSILLECTOMY  1990   w/ adenoids and uvula  . TOTAL HIP ARTHROPLASTY Right 01/2011   [place]  . TOTAL HIP ARTHROPLASTY Left 04/16/2016   Procedure: TOTAL HIP ARTHROPLASTY ANTERIOR APPROACH;  Surgeon: Gaynelle Arabian, MD;  Location: [facility];  Service: Orthopedics;  Laterality: Left;  . TOTAL HIP REVISION Right 01/04/2020   Procedure: Right hip bearing surface;  Surgeon: Gaynelle Arabian, MD;  Location: [facility];  Service: Orthopedics;  Laterality: Right;  158min  . TOTAL HIP REVISION Right 04/15/2021   Procedure: Right hip bearing surface revision;  Surgeon: Gaynelle Arabian, MD;  Location: [facility];  Service: Orthopedics;  Laterality: Right;  61min  . TOTAL KNEE ARTHROPLASTY  12/27/2012   Procedure: TOTAL KNEE BILATERAL;  Surgeon: Gearlean Alf, MD;  Location: [facility];  Service: Orthopedics;  Laterality: Bilateral;    Current  Medications: Current Meds  Medication Sig  . aspirin 81 MG EC tablet Take 81 mg by mouth daily. Swallow whole.  Marland Kitchen atorvastatin (LIPITOR) 80 MG tablet TAKE 1 TABLET (80 MG TOTAL) BY MOUTH DAILY AT 6 PM.  . cholecalciferol (VITAMIN D3) 25 MCG (1000 UNIT) tablet Take 1,000 Units by mouth daily.  . clopidogrel (PLAVIX) 75 MG tablet TAKE 1 TABLET BY MOUTH EVERY DAY  . HYDROcodone-acetaminophen (NORCO/VICODIN) 5-325 MG tablet Take 1-2 tablets by mouth every 6 (six) hours as needed for severe pain.  Marland Kitchen levothyroxine (SYNTHROID) 150 MCG tablet TAKE 1 TABLET BY MOUTH EVERY DAY BEFORE BREAKFAST  . metFORMIN (GLUCOPHAGE) 500 MG tablet Take 1 tablet (500 mg total) by mouth daily with breakfast.  . metoprolol succinate (TOPROL-XL) 25 MG 24 hr tablet Take 1 tablet (25 mg total) by mouth daily.  . nitroGLYCERIN (NITROSTAT) 0.4 MG SL tablet PLACE 1 TABLET (0.4 MG TOTAL) UNDER THE TONGUE EVERY 5 (FIVE) MINUTES AS NEEDED FOR CHEST PAIN.  . tamsulosin (FLOMAX) 0.4 MG CAPS capsule Take 0.4 mg by mouth daily.  Marland Kitchen telmisartan (MICARDIS) 80 MG tablet TAKE 1 TABLET BY MOUTH EVERY DAY     Allergies:   Patient has no known allergies.   Social History   Socioeconomic History  . Marital status: Married    Spouse name: Tavish Gettis  . Number of children: Not on file  . Years of education: Not on file  . Highest education level: Not on file  Occupational History  . Occupation: Retired  Tobacco Use  . Smoking status: Never Smoker  . Smokeless tobacco: Never Used  Vaping Use  . Vaping Use: Never used  Substance and Sexual Activity  . Alcohol use: Yes    Alcohol/week: 0.0 standard drinks    Comment: rarely, social  . Drug use: No  . Sexual activity: Not on file  Other Topics Concern  . Not on file  Social History Narrative   Working part time, contemplating retirement end of 2016   Lives at home with spouse      Exercise: water exercises   Social Determinants of Health   Financial Resource Strain: Low  Risk   . Difficulty of Paying Living Expenses: Not hard at all  Food Insecurity: No Food Insecurity  . Worried About Charity fundraiser in the Last Year: Never true  . Ran Out of Food in the Last Year: Never true  Transportation Needs: No Transportation Needs  . Lack of Transportation (Medical): No  . Lack of Transportation (Non-Medical): No  Physical Activity: Sufficiently Active  . Days of Exercise per Week: 5 days  . Minutes of  Exercise per Session: 60 min  Stress: No Stress Concern Present  . Feeling of Stress : Not at all  Social Connections: Socially Integrated  . Frequency of Communication with Friends and Family: More than three times a week  . Frequency of Social Gatherings with Friends and Family: More than three times a week  . Attends Religious Services: More than 4 times per year  . Active Member of Clubs or Organizations: Yes  . Attends Archivist Meetings: More than 4 times per year  . Marital Status: Married     Family History: The patient's family history includes ALS in his maternal grandfather; Arthritis in an other family member; Cancer in his father; Dementia in his mother; Diabetes in his father; Heart disease (age of onset: 87) in his father; Hyperlipidemia in his father; Hypertension in his father. There is no history of Colon cancer.  ROS:   Please see the history of present illness.    ROS  All other systems reviewed and negative.   EKGs/Labs/Other Studies Reviewed:    The following studies were reviewed today: none  EKG:  EKG is not ordered today  Recent Labs: 04/16/2021: Hemoglobin 13.8; Platelets 124 04/22/2021: ALT 34; BUN 16; Creatinine, Ser 0.84; Potassium 4.3; Sodium 142; TSH 0.581   Recent Lipid Panel    Component Value Date/Time   CHOL 117 04/22/2021 0838   TRIG 72 04/22/2021 0838   HDL 41 04/22/2021 0838   CHOLHDL 3 12/11/2020 0946   VLDL 16.0 12/11/2020 0946   LDLCALC 61 04/22/2021 0838   LDLDIRECT 153.1 06/09/2011 0942     Physical Exam:    VS:  BP 128/72   Pulse (!) 52   Ht 6' (1.829 m)   Wt 250 lb 12.8 oz (113.8 kg)   SpO2 97%   BMI 34.01 kg/m     Wt Readings from Last 3 Encounters:  05/20/21 250 lb 12.8 oz (113.8 kg)  05/07/21 240 lb (108.9 kg)  04/22/21 242 lb (109.8 kg)    GEN: Well nourished, well developed in no acute distress HEENT: Normal NECK: No JVD; No carotid bruits LYMPHATICS: No lymphadenopathy CARDIAC:RRR, no murmurs, rubs, gallops RESPIRATORY:  Clear to auscultation without rales, wheezing or rhonchi  ABDOMEN: Soft, non-tender, non-distended MUSCULOSKELETAL:  No edema; No deformity  SKIN: Warm and dry NEUROLOGIC:  Alert and oriented x 3 PSYCHIATRIC:  Normal affect    ASSESSMENT:    1. Coronary artery disease involving native coronary artery of native heart without angina pectoris   2. Essential hypertension   3. Pure hypercholesterolemia   4. OSA treated with BiPAP    PLAN:    In order of problems listed above:  1.  ASCAD -s/p inferior STEMI with cath showing 95% PDA that was felt to be the culprit vessel. This was treated with PCI-DES. Also noted was CTO CFX with collaterals from the PDA on OM1. There was an unsuccessful attempt at opening the CFX.Also has 70% mid LAD lesion, to be treated medically.EF was 60% with inferobasilar HK. -he has not had any anginal symptoms since I saw him last -continue ASA, Plavix 75mg  daily, statin and BB  2.  HTN -Bp is controlled on exam today -Continue prescription drug management with Toprol XL 25mg  daily and Micardis 80mg  daily>>refilled for 1 year -I have personally reviewed and interpreted outside labs performed by patient's PCP which showed SCr 0.84, K+ 4.3 in April 2022  3.  HLD -LDL goal < 70 -I have personally reviewed and  interpreted outside labs performed by patient's PCP which showed LDL at goal at 61 and ALT 34 in April 2022 -Continue prescription drug management with Atorvastatin 40mg  daily  4.  OSA - The  patient is tolerating PAP therapy well without any problems. The PAP download performed by his DME was personally reviewed and interpreted by me today and showed an AHI of 6/hr on auto BiPAP ASV  with 94% compliance in using more than 4 hours nightly.  The patient has been using and benefiting from PAP use and will continue to benefit from therapy.    Medication Adjustments/Labs and Tests Ordered: Current medicines are reviewed at length with the patient today.  Concerns regarding medicines are outlined above.  No orders of the defined types were placed in this encounter.  No orders of the defined types were placed in this encounter.   Signed, Fransico Him, MD  05/20/2021 9:10 AM    [place]

## 2021-05-20 NOTE — Addendum Note (Signed)
Addended by: Antonieta Iba on: 05/20/2021 09:43 AM   Modules accepted: Orders

## 2021-05-20 NOTE — Patient Instructions (Signed)

## 2021-05-21 DIAGNOSIS — Z4789 Encounter for other orthopedic aftercare: Secondary | ICD-10-CM | POA: Diagnosis not present

## 2021-05-27 ENCOUNTER — Encounter: Payer: Self-pay | Admitting: Internal Medicine

## 2021-05-28 DIAGNOSIS — U071 COVID-19: Secondary | ICD-10-CM | POA: Insufficient documentation

## 2021-05-28 NOTE — Progress Notes (Signed)
Virtual Visit via Video Note  I connected with [name] on 05/28/21 at  7:50 AM EDT by a video enabled telemedicine application and verified that I am speaking with the correct person using two identifiers.   I discussed the limitations of evaluation and management by telemedicine and the availability of in person appointments. The patient expressed understanding and agreed to proceed.  Present for the visit:  Myself, Dr Billey Gosling, Ruby [employer]a.  The patient is currently at home and I am in the office.    No referring provider.    History of Present Illness: This is an acute visit for covid  Sunday, three days ago symptoms started.  The next day his symptoms were worse and tested positive.  He states nasal congestion, mild sinus pain, sore throat, postnasal drip, runny nose, cough that is productive of some of sputum, mild diarrhea this morning, some body aches and headaches.  He has not had any fevers or chills.  He has not had any shortness of breath, wheezing or loss of taste/smell.    He has been taking over-the-counter cold medications.  Review of Systems  Constitutional: Negative for chills and fever.  HENT: Positive for congestion, sinus pain (mild) and sore throat. Negative for ear pain.        PND, Runny nose,  No loss of taste/smell  Respiratory: Positive for cough and sputum production. Negative for shortness of breath and wheezing.   Gastrointestinal: Positive for diarrhea (mild this morning). Negative for nausea and vomiting.  Musculoskeletal: Positive for myalgias.  Neurological: Positive for headaches. Negative for dizziness.      Social History   Socioeconomic History  . Marital status: Married    Spouse name: Braxtin Bamba  . Number of children: Not on file  . Years of education: Not on file  . Highest education level: Not on file  Occupational History  . Occupation: Retired  Tobacco Use  . Smoking status: Never Smoker  . Smokeless tobacco: Never Used   Vaping Use  . Vaping Use: Never used  Substance and Sexual Activity  . Alcohol use: Yes    Alcohol/week: 0.0 standard drinks    Comment: rarely, social  . Drug use: No  . Sexual activity: Not on file  Other Topics Concern  . Not on file  Social History Narrative   Working part time, contemplating retirement end of 2016   Lives at home with spouse      Exercise: water exercises   Social Determinants of Health   Financial Resource Strain: Low Risk   . Difficulty of Paying Living Expenses: Not hard at all  Food Insecurity: No Food Insecurity  . Worried About Charity fundraiser in the Last Year: Never true  . Ran Out of Food in the Last Year: Never true  Transportation Needs: No Transportation Needs  . Lack of Transportation (Medical): No  . Lack of Transportation (Non-Medical): No  Physical Activity: Sufficiently Active  . Days of Exercise per Week: 5 days  . Minutes of Exercise per Session: 60 min  Stress: No Stress Concern Present  . Feeling of Stress : Not at all  Social Connections: Socially Integrated  . Frequency of Communication with Friends and Family: More than three times a week  . Frequency of Social Gatherings with Friends and Family: More than three times a week  . Attends Religious Services: More than 4 times per year  . Active Member of Clubs or Organizations: Yes  .  Attends Archivist Meetings: More than 4 times per year  . Marital Status: Married     Observations/Objective: Appears well in NAD Sounds congested Breathing normally  Assessment and Plan:  See Problem List for Assessment and Plan of chronic medical problems.   Follow Up Instructions:    I discussed the assessment and treatment plan with the patient. The patient was provided an opportunity to ask questions and all were answered. The patient agreed with the plan and demonstrated an understanding of the instructions.   The patient was advised to call back or seek an in-person  evaluation if the symptoms worsen or if the condition fails to improve as anticipated.    Binnie Rail, MD

## 2021-05-29 ENCOUNTER — Other Ambulatory Visit: Payer: Self-pay | Admitting: Internal Medicine

## 2021-05-29 ENCOUNTER — Ambulatory Visit (INDEPENDENT_AMBULATORY_CARE_PROVIDER_SITE_OTHER): Payer: Medicare Other | Admitting: Adult Health

## 2021-05-29 ENCOUNTER — Encounter: Payer: Self-pay | Admitting: Internal Medicine

## 2021-05-29 ENCOUNTER — Telehealth (INDEPENDENT_AMBULATORY_CARE_PROVIDER_SITE_OTHER): Payer: Medicare Other | Admitting: Internal Medicine

## 2021-05-29 DIAGNOSIS — I25750 Atherosclerosis of native coronary artery of transplanted heart with unstable angina: Secondary | ICD-10-CM

## 2021-05-29 DIAGNOSIS — U071 COVID-19: Secondary | ICD-10-CM

## 2021-05-29 MED ORDER — MOLNUPIRAVIR 200 MG PO CAPS: 800.0000 mg | ORAL_CAPSULE | Freq: Two times a day (BID) | ORAL | 0 refills | Status: AC

## 2021-05-29 NOTE — Assessment & Plan Note (Signed)
Acute Symptoms started 4 days ago, tested +3 days ago Symptoms mild-moderate in nature He is high risk given his age and medical problems and we did discuss antiviral medications Discussed that there is no approved treatment for COVID Recommend molnupiravir over Paxlovid given that he is on Plavix and atorvastatin Discussed possible side effects of diarrhea, nausea and dizziness Will start molnupiravir 800 mg twice daily x5 days Deferred any prescription cough medications Advised to take over-the-counter cold medications as needed, Tylenol or ibuprofen Rest, fluids Discussed quarantine recommendations and masking recommendations He will call with any questions or concerns

## 2021-06-10 NOTE — Progress Notes (Signed)
Subjective:    Patient ID: [name], male    DOB: 1951/10/13, 70 y.o.   MRN: [id number]  HPI The patient is here for follow up of their chronic medical problems, including CAD, htn, s/p STEMI, OSA, prediabetes, hypothyroidism, hld  He had covid ealier this month.  He still feels a little fatigued.  He still has a mild PND and congestion.  His symptoms are continuing to improve even  He is exercising regularly.   He swims.  Overall doing well and has no concerns.  Medications and allergies reviewed with patient and updated if appropriate.  Patient Active Problem List   Diagnosis Date Noted   COVID 05/28/2021   Recurrent dislocation of right hip 04/15/2021   Hip dislocation, right ([place]) 03/12/2021   Bradycardia 03/12/2021   Hepatic steatosis 01/03/2021   Other fatigue 01/01/2021   Shortness of breath on exertion 01/01/2021   Elevated LFTs 01/01/2021   History of ST elevation myocardial infarction (STEMI) 01/01/2021   Vitamin D deficiency 01/01/2021   Failed total hip arthroplasty ([place]) 01/04/2020   OSA treated with BiPAP 05/03/2019   CAD S/P percutaneous coronary angioplasty 01/11/2019   CAD (coronary artery disease), native coronary artery 01/11/2019   HLD (hyperlipidemia) 01/11/2019   Acute ST elevation myocardial infarction (STEMI) of inferior wall ([place]) 12/30/2018   ST elevation myocardial infarction (STEMI) ([place]) 12/30/2018   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 ([place])    Prediabetes 05/11/2017   Ventral hernia without obstruction or gangrene 50/53/9767   Umbilical hernia without obstruction or gangrene 05/11/2017   OA (osteoarthritis) of hip 04/16/2016   Pre-syncope 02/22/2016   Vertigo 02/22/2016   Knee joint replacement status 01/12/2013   OA (osteoarthritis) of knee 12/27/2012   Benign prostatic hyperplasia    Hypothyroidism 09/04/2009   Obesity 09/04/2009   Essential hypertension 09/04/2009    COLONIC POLYPS, HX OF 09/04/2009    Current Outpatient Medications on File Prior to Visit  Medication Sig Dispense Refill   aspirin 81 MG EC tablet Take 81 mg by mouth daily. Swallow whole.     atorvastatin (LIPITOR) 80 MG tablet Take 1 tablet (80 mg total) by mouth daily at 6 PM. 90 tablet 3   cholecalciferol (VITAMIN D3) 25 MCG (1000 UNIT) tablet Take 1,000 Units by mouth daily.     clopidogrel (PLAVIX) 75 MG tablet Take 1 tablet (75 mg total) by mouth daily. 90 tablet 3   levothyroxine (SYNTHROID) 150 MCG tablet TAKE 1 TABLET BY MOUTH EVERY DAY BEFORE BREAKFAST 60 tablet 2   metFORMIN (GLUCOPHAGE) 500 MG tablet Take 1 tablet (500 mg total) by mouth daily with breakfast. 90 tablet 0   metoprolol succinate (TOPROL-XL) 25 MG 24 hr tablet Take 1 tablet (25 mg total) by mouth daily. 90 tablet 3   nitroGLYCERIN (NITROSTAT) 0.4 MG SL tablet PLACE 1 TABLET (0.4 MG TOTAL) UNDER THE TONGUE EVERY 5 (FIVE) MINUTES AS NEEDED FOR CHEST PAIN. 25 tablet 3   tamsulosin (FLOMAX) 0.4 MG CAPS capsule Take 0.4 mg by mouth daily.     telmisartan (MICARDIS) 80 MG tablet Take 1 tablet (80 mg total) by mouth daily. 90 tablet 3   No current facility-administered medications on file prior to visit.    Past Medical History:  Diagnosis Date   Back pain    BPH (benign prostatic hypertrophy)    Chest pain    COLONIC POLYPS, HX OF 2007   clear colo w/o polyps  04/2015: 54yr follow up   Constipation    Coronary artery disease    Decreased mobility    due to BL knee and hip replacement   Fatigue    GERD (gastroesophageal reflux disease)    Heartburn    History of kidney stones    passed   Hyperlipidemia    HYPERTENSION    HYPOTHYROIDISM    Joint pain    Myocardial infarction ([place]) 12/2018   Inferior STEMI   OA (osteoarthritis)    Knees   OBESITY    OSA treated with BiPAP    PAF (paroxysmal atrial fibrillation) ([facility])    Pre-diabetes    Shortness of breath on exertion    Umbilical hernia    Ventral  hernia     Past Surgical History:  Procedure Laterality Date   arthroscopic knee surgery     (R) 2003 & (L) 2010   CARDIAC CATHETERIZATION     COLONOSCOPY  04/2015   no polyps ([place])   CORONARY/GRAFT ACUTE MI REVASCULARIZATION N/A 12/30/2018   Procedure: Coronary/Graft Acute MI Revascularization;  Surgeon: Belva Crome, MD;  Location: [facility];  Service: Cardiovascular;  Laterality: N/A;   HIP CLOSED REDUCTION Right 03/12/2021   Procedure: CLOSED REDUCTION HIP;  Surgeon: Melina Schools, MD;  Location: [facility];  Service: Orthopedics;  Laterality: Right;   LAMINECTOMY  1991   LEFT HEART CATH AND CORONARY ANGIOGRAPHY N/A 12/30/2018   Procedure: LEFT HEART CATH AND CORONARY ANGIOGRAPHY;  Surgeon: Belva Crome, MD;  Location: [facility];  Service: Cardiovascular;  Laterality: N/A;   TONSILLECTOMY  1990   w/ adenoids and uvula   TOTAL HIP ARTHROPLASTY Right 01/2011   [place]   TOTAL HIP ARTHROPLASTY Left 04/16/2016   Procedure: TOTAL HIP ARTHROPLASTY ANTERIOR APPROACH;  Surgeon: Gaynelle Arabian, MD;  Location: [facility];  Service: Orthopedics;  Laterality: Left;   TOTAL HIP REVISION Right 01/04/2020   Procedure: Right hip bearing surface;  Surgeon: Gaynelle Arabian, MD;  Location: [facility];  Service: Orthopedics;  Laterality: Right;  139min   TOTAL HIP REVISION Right 04/15/2021   Procedure: Right hip bearing surface revision;  Surgeon: Gaynelle Arabian, MD;  Location: [facility];  Service: Orthopedics;  Laterality: Right;  75min   TOTAL KNEE ARTHROPLASTY  12/27/2012   Procedure: TOTAL KNEE BILATERAL;  Surgeon: Gearlean Alf, MD;  Location: [facility];  Service: Orthopedics;  Laterality: Bilateral;    Social History   Socioeconomic History   Marital status: Married    Spouse name: Son Barkan   Number of children: Not on file   Years of education: Not on file   Highest education level: Not on file  Occupational History   Occupation: Retired  Tobacco Use   Smoking status: Never    Smokeless tobacco: Never  Vaping Use   Vaping Use: Never used  Substance and Sexual Activity   Alcohol use: Yes    Alcohol/week: 0.0 standard drinks    Comment: rarely, social   Drug use: No   Sexual activity: Not on file  Other Topics Concern   Not on file  Social History Narrative   Working part time, contemplating retirement end of 2016   Lives at home with spouse      Exercise: water exercises   Social Determinants of Health   Financial Resource Strain: Low Risk    Difficulty of Paying Living Expenses: Not hard at all  Food Insecurity: No Food Insecurity   Worried About Running  Out of Food in the Last Year: Never true   Ran Out of Food in the Last Year: Never true  Transportation Needs: No Transportation Needs   Lack of Transportation (Medical): No   Lack of Transportation (Non-Medical): No  Physical Activity: Sufficiently Active   Days of Exercise per Week: 5 days   Minutes of Exercise per Session: 60 min  Stress: No Stress Concern Present   Feeling of Stress : Not at all  Social Connections: Socially Integrated   Frequency of Communication with Friends and Family: More than three times a week   Frequency of Social Gatherings with Friends and Family: More than three times a week   Attends Religious Services: More than 4 times per year   Active Member of [employer] or Organizations: Yes   Attends Music therapist: More than 4 times per year   Marital Status: Married    Family History  Problem Relation Age of Onset   Heart disease Father 31       CABG age 16   Hypertension Father    Diabetes Father    Hyperlipidemia Father    Cancer Father    Dementia Mother        ?ALS vs SNP   ALS Maternal Grandfather    Arthritis Other        Parent & grandparents   Colon cancer Neg Hx     Review of Systems  Constitutional:  Negative for chills and fever.  Respiratory:  Positive for cough (to clear throat). Negative for shortness of breath and wheezing.    Cardiovascular:  Negative for chest pain, palpitations and leg swelling.  Neurological:  Positive for headaches (occ). Negative for light-headedness.      Objective:   Vitals:   06/11/21 0908  BP: 110/70  Pulse: 83  Temp: 98.4 F (36.9 C)  SpO2: 97%   BP Readings from Last 3 Encounters:  06/11/21 110/70  05/20/21 128/72  05/07/21 100/65   Wt Readings from Last 3 Encounters:  06/11/21 244 lb (110.7 kg)  05/20/21 250 lb 12.8 oz (113.8 kg)  05/07/21 240 lb (108.9 kg)   Body mass index is 33.09 kg/m.   Physical Exam    Constitutional: Appears well-developed and well-nourished. No distress.  HENT:  Head: Normocephalic and atraumatic.  Neck: Neck supple. No tracheal deviation present. No thyromegaly present.  No cervical lymphadenopathy Cardiovascular: Normal rate, regular rhythm and normal heart sounds.   No murmur heard. No carotid bruit .  No edema Pulmonary/Chest: Effort normal and breath sounds normal. No respiratory distress. No has no wheezes. No rales.  Skin: Skin is warm and dry. Not diaphoretic.  Psychiatric: Normal mood and affect. Behavior is normal.      Assessment & Plan:    See Problem List for Assessment and Plan of chronic medical problems.    This visit occurred during the SARS-CoV-2 public health emergency.  Safety protocols were in place, including screening questions prior to the visit, additional usage of staff PPE, and extensive cleaning of exam room while observing appropriate contact time as indicated for disinfecting solutions.

## 2021-06-10 NOTE — Patient Instructions (Addendum)
  Blood work was ordered.     Medications changes include :     Your prescription(s) have been submitted to your pharmacy. Please take as directed and contact our office if you believe you are having problem(s) with the medication(s).    Please followup in 1 year

## 2021-06-11 ENCOUNTER — Encounter: Payer: Self-pay | Admitting: Internal Medicine

## 2021-06-11 ENCOUNTER — Ambulatory Visit (INDEPENDENT_AMBULATORY_CARE_PROVIDER_SITE_OTHER): Payer: Medicare Other | Admitting: Internal Medicine

## 2021-06-11 ENCOUNTER — Other Ambulatory Visit: Payer: Self-pay

## 2021-06-11 VITALS — BP 110/70 | HR 83 | Temp 98.4°F | Ht 72.0 in | Wt 244.0 lb

## 2021-06-11 DIAGNOSIS — E782 Mixed hyperlipidemia: Secondary | ICD-10-CM | POA: Diagnosis not present

## 2021-06-11 DIAGNOSIS — Z6835 Body mass index (BMI) 35.0-35.9, adult: Secondary | ICD-10-CM

## 2021-06-11 DIAGNOSIS — E034 Atrophy of thyroid (acquired): Secondary | ICD-10-CM | POA: Diagnosis not present

## 2021-06-11 DIAGNOSIS — I25750 Atherosclerosis of native coronary artery of transplanted heart with unstable angina: Secondary | ICD-10-CM | POA: Diagnosis not present

## 2021-06-11 DIAGNOSIS — G4733 Obstructive sleep apnea (adult) (pediatric): Secondary | ICD-10-CM | POA: Diagnosis not present

## 2021-06-11 DIAGNOSIS — I2510 Atherosclerotic heart disease of native coronary artery without angina pectoris: Secondary | ICD-10-CM

## 2021-06-11 DIAGNOSIS — I10 Essential (primary) hypertension: Secondary | ICD-10-CM | POA: Diagnosis not present

## 2021-06-11 DIAGNOSIS — R7303 Prediabetes: Secondary | ICD-10-CM | POA: Diagnosis not present

## 2021-06-11 MED ORDER — LEVOTHYROXINE SODIUM 150 MCG PO TABS: ORAL_TABLET | ORAL | 3 refills | Status: DC

## 2021-06-11 NOTE — Assessment & Plan Note (Addendum)
Chronic Placed on metformin 500 mg qd with breakfast by a healthy weight and wellness clinic, which she is doing well with Low sugar / carb diet Stressed regular exercise

## 2021-06-11 NOTE — Assessment & Plan Note (Signed)
Chronic Up-to-date with cardiology visits No concerning chest pain or anginal-like symptoms Continue aspirin 81 mg daily, atorvastatin 80 mg daily, Plavix 75 mg daily and metoprolol XL 25 mg daily Blood pressure well controlled

## 2021-06-11 NOTE — Assessment & Plan Note (Signed)
Chronic Continue atorvastatin 80 mg daily Continue exercise and healthy diet encouraged

## 2021-06-11 NOTE — Assessment & Plan Note (Addendum)
Chronic  tsh in April normal Continue levothyroxine 150 mg qd

## 2021-06-11 NOTE — Assessment & Plan Note (Addendum)
Chronic BP well controlled Continue current medications-prescribed by cardiology

## 2021-06-11 NOTE — Assessment & Plan Note (Signed)
Chronic Using BiPAP nightly

## 2021-06-11 NOTE — Assessment & Plan Note (Signed)
Chronic Will return to the healthy weight and wellness clinic

## 2021-06-17 ENCOUNTER — Encounter (INDEPENDENT_AMBULATORY_CARE_PROVIDER_SITE_OTHER): Payer: Self-pay | Admitting: Adult Health

## 2021-06-17 ENCOUNTER — Other Ambulatory Visit: Payer: Self-pay

## 2021-06-17 ENCOUNTER — Ambulatory Visit (INDEPENDENT_AMBULATORY_CARE_PROVIDER_SITE_OTHER): Payer: Medicare Other | Admitting: Adult Health

## 2021-06-17 VITALS — BP 113/68 | HR 65 | Temp 97.8°F | Ht 71.0 in | Wt 239.0 lb

## 2021-06-17 DIAGNOSIS — R7303 Prediabetes: Secondary | ICD-10-CM | POA: Diagnosis not present

## 2021-06-17 DIAGNOSIS — I25750 Atherosclerosis of native coronary artery of transplanted heart with unstable angina: Secondary | ICD-10-CM

## 2021-06-17 DIAGNOSIS — U071 COVID-19: Secondary | ICD-10-CM

## 2021-06-17 DIAGNOSIS — Z6837 Body mass index (BMI) 37.0-37.9, adult: Secondary | ICD-10-CM | POA: Diagnosis not present

## 2021-06-18 NOTE — Progress Notes (Signed)
Chief Complaint:   OBESITY [name] is here to discuss his progress with his obesity treatment plan along with follow-up of his obesity related diagnoses. [name] is on the Category 3 Plan and states he is following his eating plan approximately 75% of the time. [name] states he is swimming and doing yard work 120 minutes 5 times per week.  Today's visit was #: 9 Starting weight: 266 lbs Starting date: 01/01/2021 Today's weight: 239 lbs Today's date: 06/17/2021 Total lbs lost to date: 27 Total lbs lost since last in-office visit: 1  Interim History: [name] recently recovered from COVID-19 infection and reports no residual symptoms. He completed course of molnupiravir (800 mg BID x 5 days).  Subjective:   1. Pre-diabetes  [name]'s A1c was 6.1 on 11/03/2019.  A1c on 12/29/2019 of 5.6 12/11/2020 of 6.0  03/12/2021 of 5.3 He is on Metformin 500mg  QD- denies GI upset.  2. COVID-19 virus infection [name] recently recovered from COVID-19 infection and reports no residual symptoms. He completed course of molnupiravir (800 mg BID x 5 days).  Assessment/Plan:   1. Pre-diabetes [name] will continue to work on weight loss, exercise, and decreasing simple carbohydrates to help decrease the risk of diabetes.  Continue morning Metformin 500 mg.  2. COVID-19 virus infection Increase fluids, increase rest, and advance activity as tolerated.  3. Obesity with current BMI 33.4  [name] is currently in the action stage of change. As such, his goal is to continue with weight loss efforts. He has agreed to the Category 3 Plan.   Increase fluids, increase rest, and advance activity as tolerated.  Exercise goals:  As is  Behavioral modification strategies: increasing lean protein intake, decreasing simple carbohydrates, meal planning and cooking strategies, keeping healthy foods in the home, and planning for success.  [name] has agreed to follow-up with our clinic in 3 weeks. He was  informed of the importance of frequent follow-up visits to maximize his success with intensive lifestyle modifications for his multiple health conditions.   Objective:   Blood pressure 113/68, pulse 65, temperature 97.8 F (36.6 C), height 5\' 11"  (1.803 m), weight 239 lb (108.4 kg), SpO2 97 %. Body mass index is 33.33 kg/m.  General: Cooperative, alert, well developed, in no acute distress. HEENT: Conjunctivae and lids unremarkable. Cardiovascular: Regular rhythm.  Lungs: Normal work of breathing. Neurologic: No focal deficits.   Lab Results  Component Value Date   CREATININE 0.84 04/22/2021   BUN 16 04/22/2021   NA 142 04/22/2021   K 4.3 04/22/2021   CL 106 04/22/2021   CO2 21 04/22/2021   Lab Results  Component Value Date   ALT 34 04/22/2021   AST 31 04/22/2021   ALKPHOS 99 04/22/2021   BILITOT 1.1 04/22/2021   Lab Results  Component Value Date   HGBA1C 5.3 03/12/2021   HGBA1C 6.0 12/11/2020   HGBA1C 5.6 12/29/2019   HGBA1C 6.1 11/03/2019   HGBA1C 5.4 12/31/2018   Lab Results  Component Value Date   INSULIN 19.1 04/22/2021   INSULIN 34.7 (H) 01/01/2021   Lab Results  Component Value Date   TSH 0.581 04/22/2021   Lab Results  Component Value Date   CHOL 117 04/22/2021   HDL 41 04/22/2021   LDLCALC 61 04/22/2021   LDLDIRECT 153.1 06/09/2011   TRIG 72 04/22/2021   CHOLHDL 3 12/11/2020   Lab Results  Component Value Date   WBC 7.0 04/16/2021   HGB 13.8 04/16/2021   HCT 40.4 04/16/2021  MCV 90.4 04/16/2021   PLT 124 (L) 04/16/2021   No results found for: IRON, TIBC, FERRITIN  Attestation Statements:   Reviewed by clinician on day of visit: allergies, medications, problem list, medical history, surgical history, family history, social history, and previous encounter notes.  Time spent on visit including pre-visit chart review and post-visit care and charting was 30 minutes.   Coral Ceo, CMA, am acting as transcriptionist for Mina Marble,  NP.  I have reviewed the above documentation for accuracy and completeness, and I agree with the above. -  Wilmoth Rasnic d. Ambre Kobayashi, NP-C

## 2021-07-07 DIAGNOSIS — Z20822 Contact with and (suspected) exposure to covid-19: Secondary | ICD-10-CM | POA: Diagnosis not present

## 2021-07-11 ENCOUNTER — Telehealth: Payer: Self-pay | Admitting: Pharmacist

## 2021-07-11 ENCOUNTER — Ambulatory Visit (INDEPENDENT_AMBULATORY_CARE_PROVIDER_SITE_OTHER): Payer: Medicare Other | Admitting: Adult Health

## 2021-07-11 ENCOUNTER — Encounter (INDEPENDENT_AMBULATORY_CARE_PROVIDER_SITE_OTHER): Payer: Self-pay | Admitting: Adult Health

## 2021-07-11 ENCOUNTER — Other Ambulatory Visit: Payer: Self-pay

## 2021-07-11 VITALS — BP 110/68 | HR 53 | Temp 97.5°F | Ht 71.0 in | Wt 239.0 lb

## 2021-07-11 DIAGNOSIS — Z6837 Body mass index (BMI) 37.0-37.9, adult: Secondary | ICD-10-CM

## 2021-07-11 DIAGNOSIS — I25750 Atherosclerosis of native coronary artery of transplanted heart with unstable angina: Secondary | ICD-10-CM

## 2021-07-11 DIAGNOSIS — R7303 Prediabetes: Secondary | ICD-10-CM | POA: Diagnosis not present

## 2021-07-11 DIAGNOSIS — E785 Hyperlipidemia, unspecified: Secondary | ICD-10-CM

## 2021-07-11 MED ORDER — METFORMIN HCL 500 MG PO TABS: 500.0000 mg | ORAL_TABLET | Freq: Every day | ORAL | 0 refills | Status: DC

## 2021-07-11 NOTE — Progress Notes (Addendum)
Chronic Care Management Pharmacy Assistant   Name: SHAWNDALE KILPATRICK  MRN: [id number] DOB: 02-20-1951  Reason for Encounter: Disease State General Adherence   Recent office visits:  06/11/21 Billey Gosling MD (PCP)- Patient seen for CAD. F/U 1 year.  05/29/21 Billey Gosling MD ( PCP) Video- Patient was seen for covid. Started on Molnupiravir 800 mg BID.  Recent consult visits:  07/11/21 Consuela Mimes NP (Weight mgmt) Pre-diabetes. No med changes. F/U in 3 weeks 06/17/21 Mina Marble NP(Weight MGMT)- Patient seen for Pre-diabetes. F/U in 3 weeks.  05/20/21 Fransico Him MD(Cardiology) Patient seen for Coronary Artery Disease  04/22/21 Dennard Nip MN(Family Medicine)- Patient seen for Hyperlipidemia. Labs were ordered and advised to Follow up in 4 weeks  02/26/21 Abby Potash PA-C [facility])- Patient was seen for Vitamin D Deficiency and Hypertension. Patient will follow up 2-3 times per year to avoid over replacement. Patient will follow up with Cardiology for Hypertension.    Hospital visits:   1.Medication Reconciliation was completed by comparing discharge summary, patient's EMR and Pharmacy list, and upon discussion with patient.  Admitted to the hospital on 04/15/21 due to Total Hip Replacement. Discharge date was 04/16/21. Discharged from [facility].    Medications that remain the same after Hospital Discharge:??  -All other medications will remain the same.    Start Medications: HYDROcodone-acetaminophen (NORCO/VICODIN) methocarbamol (ROBAXIN) traMADol (ULTRAM)  CHANGE how you take: Aspirin  2.Medication Reconciliation was completed by comparing discharge summary, patient's EMR and Pharmacy list, and upon discussion with patient.  Admitted to the hospital on 03/12/21 due to Hip Dislocation. Discharge date was 03/13/21. Discharged from [facility].    Medications that remain the same after Hospital Discharge:??  -All other  medications will remain the same.     Medications: Outpatient Encounter Medications as of 07/11/2021  Medication Sig   aspirin 81 MG EC tablet Take 81 mg by mouth daily. Swallow whole.   atorvastatin (LIPITOR) 80 MG tablet Take 1 tablet (80 mg total) by mouth daily at 6 PM.   cholecalciferol (VITAMIN D3) 25 MCG (1000 UNIT) tablet Take 1,000 Units by mouth daily.   clopidogrel (PLAVIX) 75 MG tablet Take 1 tablet (75 mg total) by mouth daily.   levothyroxine (SYNTHROID) 150 MCG tablet TAKE 1 TABLET BY MOUTH EVERY DAY BEFORE BREAKFAST   metFORMIN (GLUCOPHAGE) 500 MG tablet Take 1 tablet (500 mg total) by mouth daily with breakfast.   metoprolol succinate (TOPROL-XL) 25 MG 24 hr tablet Take 1 tablet (25 mg total) by mouth daily.   nitroGLYCERIN (NITROSTAT) 0.4 MG SL tablet PLACE 1 TABLET (0.4 MG TOTAL) UNDER THE TONGUE EVERY 5 (FIVE) MINUTES AS NEEDED FOR CHEST PAIN.   tamsulosin (FLOMAX) 0.4 MG CAPS capsule Take 0.4 mg by mouth daily.   telmisartan (MICARDIS) 80 MG tablet Take 1 tablet (80 mg total) by mouth daily.   No facility-administered encounter medications on file as of 07/11/2021.    Have you had any problems recently with your health? Patient stated no issues since Covid, he feels much better.  Have you had any problems with your pharmacy? Patient stated he's fine with his pharmacy   What issues or side effects are you having with your medications? Patient stated he's doing good on all of his med's.   What would you like me to pass along to [facility] for them to help you with?  Patient stated Nothing at this time.   What can we do  to take care of you better? Patient stated Nothing at this time.  Star Rating Drugs: Metformin 500 mg last filled 05/07/21 90 DS Atorvastatin 80 mg last filled 05/22/21 90 DS Telmisartan 80 mg last filled 07/05/21 90 DS  Orinda Kenner, RMA Clinical Pharmacists Assistant [phone]  Time Spent: [phone]

## 2021-07-18 NOTE — Progress Notes (Signed)
Chief Complaint:   OBESITY [name] is here to discuss his progress with his obesity treatment plan along with follow-up of his obesity related diagnoses. [name] is on the Category 3 Plan and states he is following his eating plan approximately 80% of the time. [name] states he is swimming and doing yard work 60 minutes 5 times per week.  Today's visit was #: 10 Starting weight: 266 lbs Starting date: 01/01/2021 Today's weight: 239 lbs Today's date: 07/11/2021 Total lbs lost to date: 27 Total lbs lost since last in-office visit: 0  Interim History: [name] feels he has been in "a rut" the last several weeks. He traveled to [place] to visit family and celebrated several family birthdays. He will be traveling to [place] soon to attend his 50th high school reunion. Of Note: 50th anniversary this February.  Subjective:   1. Dyslipidemia [name] is on atorvastatin 80 mg QD and denies myalgias. Statin is managed by cardiology. He denies acute cardiac sx's.  2. Pre-diabetes 04/22/21 BG was elevated at 113 and insulin level was 19.1.  He is on Metformin 500 mg QD at breakfast. He denies GI upset.  Assessment/Plan:   1. Dyslipidemia Continue statin therapy per cardiology. Cardiovascular risk and specific lipid/LDL goals reviewed.  We discussed several lifestyle modifications today and [name] will continue to work on diet, exercise and weight loss efforts. Orders and follow up as documented in patient record.   Counseling Intensive lifestyle modifications are the first line treatment for this issue. Dietary changes: Increase soluble fiber. Decrease simple carbohydrates. Exercise changes: Moderate to vigorous-intensity aerobic activity 150 minutes per week if tolerated. Lipid-lowering medications: see documented in medical record.  2. Pre-diabetes [name] will continue to work on weight loss, exercise, and decreasing simple carbohydrates to help decrease the risk of  diabetes.  Check fasting labs at next OV.  Refill- metFORMIN (GLUCOPHAGE) 500 MG tablet; Take 1 tablet (500 mg total) by mouth daily with breakfast.  Dispense: 90 tablet; Refill: 0  3. Obesity with current BMI 33.4  [name] is currently in the action stage of change. As such, his goal is to continue with weight loss efforts. He has agreed to the Category 3 Plan.   Exercise goals:  As is- Add in standing stretching  Behavioral modification strategies: increasing lean protein intake, decreasing simple carbohydrates, meal planning and cooking strategies, keeping healthy foods in the home, and planning for success.  [name] has agreed to follow-up with our clinic in 3 weeks, fasting. He was informed of the importance of frequent follow-up visits to maximize his success with intensive lifestyle modifications for his multiple health conditions.   Objective:   Blood pressure 110/68, pulse (!) 53, temperature (!) 97.5 F (36.4 C), height 5\' 11"  (1.803 m), weight 239 lb (108.4 kg), SpO2 97 %. Body mass index is 33.33 kg/m.  General: Cooperative, alert, well developed, in no acute distress. HEENT: Conjunctivae and lids unremarkable. Cardiovascular: Regular rhythm.  Lungs: Normal work of breathing. Neurologic: No focal deficits.   Lab Results  Component Value Date   CREATININE 0.84 04/22/2021   BUN 16 04/22/2021   NA 142 04/22/2021   K 4.3 04/22/2021   CL 106 04/22/2021   CO2 21 04/22/2021   Lab Results  Component Value Date   ALT 34 04/22/2021   AST 31 04/22/2021   ALKPHOS 99 04/22/2021   BILITOT 1.1 04/22/2021   Lab Results  Component Value Date   HGBA1C 5.3 03/12/2021   HGBA1C 6.0  12/11/2020   HGBA1C 5.6 12/29/2019   HGBA1C 6.1 11/03/2019   HGBA1C 5.4 12/31/2018   Lab Results  Component Value Date   INSULIN 19.1 04/22/2021   INSULIN 34.7 (H) 01/01/2021   Lab Results  Component Value Date   TSH 0.581 04/22/2021   Lab Results  Component Value Date   CHOL 117  04/22/2021   HDL 41 04/22/2021   LDLCALC 61 04/22/2021   LDLDIRECT 153.1 06/09/2011   TRIG 72 04/22/2021   CHOLHDL 3 12/11/2020   Lab Results  Component Value Date   VD25OH 67.0 04/22/2021   VD25OH 32.6 01/01/2021   Lab Results  Component Value Date   WBC 7.0 04/16/2021   HGB 13.8 04/16/2021   HCT 40.4 04/16/2021   MCV 90.4 04/16/2021   PLT 124 (L) 04/16/2021   No results found for: IRON, TIBC, FERRITIN  Obesity Behavioral Intervention:   Approximately 15 minutes were spent on the discussion below.  ASK: We discussed the diagnosis of obesity with [name] today and [name] agreed to give [place] permission to discuss obesity behavioral modification therapy today.  ASSESS: Souleymane has the diagnosis of obesity and his BMI today is 33.4. [name] is in the action stage of change.   ADVISE: [name] was educated on the multiple health risks of obesity as well as the benefit of weight loss to improve his health. He was advised of the need for long term treatment and the importance of lifestyle modifications to improve his current health and to decrease his risk of future health problems.  AGREE: Multiple dietary modification options and treatment options were discussed and [name] agreed to follow the recommendations documented in the above note.  ARRANGE: [name] was educated on the importance of frequent visits to treat obesity as outlined per CMS and USPSTF guidelines and agreed to schedule his next follow up appointment today.  Attestation Statements:   Reviewed by clinician on day of visit: allergies, medications, problem list, medical history, surgical history, family history, social history, and previous encounter notes.  Coral Ceo, CMA, am acting as transcriptionist for Mina Marble, NP.  I have reviewed the above documentation for accuracy and completeness, and I agree with the above. -  Jailyn Langhorst d. Cordelia Bessinger, NP-C

## 2021-07-30 ENCOUNTER — Ambulatory Visit (INDEPENDENT_AMBULATORY_CARE_PROVIDER_SITE_OTHER): Payer: Medicare Other | Admitting: Adult Health

## 2021-07-30 ENCOUNTER — Encounter (INDEPENDENT_AMBULATORY_CARE_PROVIDER_SITE_OTHER): Payer: Self-pay

## 2021-08-19 ENCOUNTER — Other Ambulatory Visit: Payer: Self-pay

## 2021-08-19 ENCOUNTER — Encounter (INDEPENDENT_AMBULATORY_CARE_PROVIDER_SITE_OTHER): Payer: Self-pay | Admitting: Adult Health

## 2021-08-19 ENCOUNTER — Ambulatory Visit (INDEPENDENT_AMBULATORY_CARE_PROVIDER_SITE_OTHER): Payer: Medicare Other | Admitting: Adult Health

## 2021-08-19 VITALS — BP 117/70 | HR 55 | Temp 98.0°F | Ht 71.0 in | Wt 241.0 lb

## 2021-08-19 DIAGNOSIS — K760 Fatty (change of) liver, not elsewhere classified: Secondary | ICD-10-CM | POA: Diagnosis not present

## 2021-08-19 DIAGNOSIS — R7303 Prediabetes: Secondary | ICD-10-CM

## 2021-08-19 DIAGNOSIS — I25750 Atherosclerosis of native coronary artery of transplanted heart with unstable angina: Secondary | ICD-10-CM

## 2021-08-19 DIAGNOSIS — Z6837 Body mass index (BMI) 37.0-37.9, adult: Secondary | ICD-10-CM | POA: Diagnosis not present

## 2021-08-20 LAB — COMPREHENSIVE METABOLIC PANEL
ALT: 105 IU/L — ABNORMAL HIGH (ref 0–44)
AST: 69 IU/L — ABNORMAL HIGH (ref 0–40)
Albumin/Globulin Ratio: 1.6 (ref 1.2–2.2)
Albumin: 4.4 g/dL (ref 3.8–4.8)
Alkaline Phosphatase: 89 IU/L (ref 44–121)
BUN/Creatinine Ratio: 13 (ref 10–24)
BUN: 11 mg/dL (ref 8–27)
Bilirubin Total: 0.7 mg/dL (ref 0.0–1.2)
CO2: 22 mmol/L (ref 20–29)
Calcium: 9.2 mg/dL (ref 8.6–10.2)
Chloride: 109 mmol/L — ABNORMAL HIGH (ref 96–106)
Creatinine, Ser: 0.85 mg/dL (ref 0.76–1.27)
Globulin, Total: 2.7 g/dL (ref 1.5–4.5)
Glucose: 110 mg/dL — ABNORMAL HIGH (ref 65–99)
Potassium: 4.7 mmol/L (ref 3.5–5.2)
Sodium: 143 mmol/L (ref 134–144)
Total Protein: 7.1 g/dL (ref 6.0–8.5)
eGFR: 95 mL/min/{1.73_m2} (ref >59)

## 2021-08-20 LAB — HEMOGLOBIN A1C
Est. average glucose Bld gHb Est-mCnc: 120 mg/dL
Hgb A1c MFr Bld: 5.8 % — ABNORMAL HIGH (ref 4.8–5.6)

## 2021-08-20 LAB — INSULIN, RANDOM: INSULIN: 22.7 u[IU]/mL (ref 2.6–24.9)

## 2021-08-20 NOTE — Progress Notes (Signed)
Chief Complaint:   OBESITY [name] is here to discuss his progress with his obesity treatment plan along with follow-up of his obesity related diagnoses. [name] is on the Category 3 Plan and states he is following his eating plan approximately 770% of the time. [name] states he is swimming and doing yard work 60 minutes 5 times per week.  Today's visit was #: 11 Starting weight: 266 lbs Starting date: 01/01/2021 Today's weight: 241 lbs Today's date: 08/19/2021 Total lbs lost to date: 25 Total lbs lost since last in-office visit: 0  Interim History:  [name] feels that he has not been as consistent with grocery shopping and meal planning and prepping the last several weeks.  Subjective:   1. Pre-diabetes [name] has had multiple elevated BG readings posted in Epic.  He is on Metformin 500 mg every morning with breakfast and denies GI upset.  Lab Results  Component Value Date   HGBA1C 5.3 03/12/2021   Lab Results  Component Value Date   INSULIN 19.1 04/22/2021   INSULIN 34.7 (H) 01/01/2021   2. NAFLD (nonalcoholic fatty liver disease) [name] denies RUQ pain.  Assessment/Plan:   1. Pre-diabetes [name] will continue to work on weight loss, exercise, and decreasing simple carbohydrates to help decrease the risk of diabetes.  Check labs today.  - Hemoglobin A1c - Insulin, random  2. NAFLD (nonalcoholic fatty liver disease) We discussed the likely diagnosis of non-alcoholic fatty liver disease today and how this condition is obesity related. [name] was educated the importance of weight loss. [name] agreed to continue with his weight loss efforts with healthier diet and exercise as an essential part of his treatment plan. Check labs today.  - Comprehensive metabolic panel  3. Obesity with current BMI 33.7  [name] is currently in the action stage of change. As such, his goal is to continue with weight loss efforts. He has agreed to the Category 3 Plan.    Handout: High Protein/Low Cal Food List; Protein Content of Food List; Category 3 Grocery List  Exercise goals:  As is  Behavioral modification strategies: increasing lean protein intake, decreasing simple carbohydrates, meal planning and cooking strategies, keeping healthy foods in the home, better snacking choices, and planning for success.  [name] has agreed to follow-up with our clinic in 3 weeks. He was informed of the importance of frequent follow-up visits to maximize his success with intensive lifestyle modifications for his multiple health conditions.   [name] was informed we would discuss his lab results at his next visit unless there is a critical issue that needs to be addressed sooner. [name] agreed to keep his next visit at the agreed upon time to discuss these results.  Objective:   Blood pressure 117/70, pulse (!) 55, temperature 98 F (36.7 C), height '5\' 11"'$  (1.803 m), weight 241 lb (109.3 kg), SpO2 98 %. Body mass index is 33.61 kg/m.  General: Cooperative, alert, well developed, in no acute distress. HEENT: Conjunctivae and lids unremarkable. Cardiovascular: Regular rhythm.  Lungs: Normal work of breathing. Neurologic: No focal deficits.   Lab Results  Component Value Date   CREATININE 0.84 04/22/2021   BUN 16 04/22/2021   NA 142 04/22/2021   K 4.3 04/22/2021   CL 106 04/22/2021   CO2 21 04/22/2021   Lab Results  Component Value Date   ALT 34 04/22/2021   AST 31 04/22/2021   ALKPHOS 99 04/22/2021   BILITOT 1.1 04/22/2021   Lab Results  Component Value Date  HGBA1C 5.3 03/12/2021   HGBA1C 6.0 12/11/2020   HGBA1C 5.6 12/29/2019   HGBA1C 6.1 11/03/2019   HGBA1C 5.4 12/31/2018   Lab Results  Component Value Date   INSULIN 19.1 04/22/2021   INSULIN 34.7 (H) 01/01/2021   Lab Results  Component Value Date   TSH 0.581 04/22/2021   Lab Results  Component Value Date   CHOL 117 04/22/2021   HDL 41 04/22/2021   LDLCALC 61 04/22/2021    LDLDIRECT 153.1 06/09/2011   TRIG 72 04/22/2021   CHOLHDL 3 12/11/2020   Lab Results  Component Value Date   VD25OH 67.0 04/22/2021   VD25OH 32.6 01/01/2021   Lab Results  Component Value Date   WBC 7.0 04/16/2021   HGB 13.8 04/16/2021   HCT 40.4 04/16/2021   MCV 90.4 04/16/2021   PLT 124 (L) 04/16/2021   No results found for: IRON, TIBC, FERRITIN  Obesity Behavioral Intervention:   Approximately 15 minutes were spent on the discussion below.  ASK: We discussed the diagnosis of obesity with [name] today and [name] agreed to give [place] permission to discuss obesity behavioral modification therapy today.  ASSESS: [name] has the diagnosis of obesity and his BMI today is 33.7. [name] is in the action stage of change.   ADVISE: [name] was educated on the multiple health risks of obesity as well as the benefit of weight loss to improve his health. He was advised of the need for long term treatment and the importance of lifestyle modifications to improve his current health and to decrease his risk of future health problems.  AGREE: Multiple dietary modification options and treatment options were discussed and [name] agreed to follow the recommendations documented in the above note.  ARRANGE: [name] was educated on the importance of frequent visits to treat obesity as outlined per CMS and USPSTF guidelines and agreed to schedule his next follow up appointment today.  Attestation Statements:   Reviewed by clinician on day of visit: allergies, medications, problem list, medical history, surgical history, family history, social history, and previous encounter notes.  Coral Ceo, CMA, am acting as transcriptionist for Mina Marble, NP.  I have reviewed the above documentation for accuracy and completeness, and I agree with the above. -  Sadik Piascik d. Terilynn Buresh, NP-C

## 2021-09-10 ENCOUNTER — Ambulatory Visit (INDEPENDENT_AMBULATORY_CARE_PROVIDER_SITE_OTHER): Payer: Medicare Other | Admitting: Adult Health

## 2021-09-13 ENCOUNTER — Ambulatory Visit (INDEPENDENT_AMBULATORY_CARE_PROVIDER_SITE_OTHER): Payer: Medicare Other | Admitting: Adult Health

## 2021-09-13 ENCOUNTER — Other Ambulatory Visit: Payer: Self-pay

## 2021-09-13 ENCOUNTER — Encounter (INDEPENDENT_AMBULATORY_CARE_PROVIDER_SITE_OTHER): Payer: Self-pay | Admitting: Adult Health

## 2021-09-13 VITALS — BP 112/67 | HR 56 | Temp 97.9°F | Ht 71.0 in | Wt 242.0 lb

## 2021-09-13 DIAGNOSIS — Z6837 Body mass index (BMI) 37.0-37.9, adult: Secondary | ICD-10-CM

## 2021-09-13 DIAGNOSIS — E7849 Other hyperlipidemia: Secondary | ICD-10-CM

## 2021-09-13 DIAGNOSIS — R7303 Prediabetes: Secondary | ICD-10-CM | POA: Diagnosis not present

## 2021-09-13 DIAGNOSIS — K760 Fatty (change of) liver, not elsewhere classified: Secondary | ICD-10-CM

## 2021-09-13 DIAGNOSIS — I25750 Atherosclerosis of native coronary artery of transplanted heart with unstable angina: Secondary | ICD-10-CM

## 2021-09-13 MED ORDER — METFORMIN HCL 500 MG PO TABS: 500.0000 mg | ORAL_TABLET | Freq: Two times a day (BID) | ORAL | 0 refills | Status: DC

## 2021-09-13 NOTE — Progress Notes (Signed)
Chief Complaint:   OBESITY [name] is here to discuss his progress with his obesity treatment plan along with follow-up of his obesity related diagnoses. [name] is on the Category 3 Plan and states he is following his eating plan approximately 70% of the time. Melo states he is swimming/mowing the lawn for 60 minutes 4-5 times per week.  Today's visit was #: 12 Starting weight: 266 lbs Starting date: 01/01/2021 Today's weight: 242 lbs Today's date: 09/13/2021 Total lbs lost to date: 0 Total lbs lost since last in-office visit: 24 lbs  Interim History: [name] is able to follow Category 3 easily at home, but it is more challenged to stay on plan when traveling to [place] and also attending funeral services for his son's mother.   He will be home and expects a consistent schedule for the next month.  Subjective:   1. NAFLD (nonalcoholic fatty liver disease) He denies RUQ pain. On 01/03/2021, US - hepatic steatosis. On 08/19/2021, CMP - ALT/AST levels both worsened.   He is on atorvastatin 80 mg.  Denies excessive ETOH.  Denies any acetaminophen use.  2. Pre-diabetes On 08/19/2021, BG 110, A1c 5.8, insulin level 22. He is on metformin 500 mg daily - denies GI upset. He will experience afternoon polyphagia.  Lab Results  Component Value Date   HGBA1C 5.8 (H) 08/19/2021   Lab Results  Component Value Date   INSULIN 22.7 08/19/2021   INSULIN 19.1 04/22/2021   INSULIN 34.7 (H) 01/01/2021   3. Other hyperlipidemia He is on atorvastatin 80 mg - managed by Cardiology. In 03/2021, lipids - stable. He denies myalgias. 08/19/21 bump in LFTs  Lab Results  Component Value Date   ALT 105 (H) 08/19/2021   AST 69 (H) 08/19/2021   ALKPHOS 89 08/19/2021   BILITOT 0.7 08/19/2021   Lab Results  Component Value Date   CHOL 117 04/22/2021   HDL 41 04/22/2021   LDLCALC 61 04/22/2021   LDLDIRECT 153.1 06/09/2011   TRIG 72 04/22/2021   CHOLHDL 3 12/11/2020    Assessment/Plan:   1. NAFLD (nonalcoholic fatty liver disease) Worsening.  Discussed labs with patient today.  Keep ETOH use to a minimum. Continue to avoid acetaminophen. Monitor labs and for GI sx's.  2. Pre-diabetes Discussed labs with patient today.  Increase metformin to 500 mg with breakfast and with lunch.   - Increase and refill metFORMIN (GLUCOPHAGE) 500 MG tablet; Take 1 tablet (500 mg total) by mouth 2 (two) times daily with a meal. Take with Breakfast and Lunch  Dispense: 120 tablet; Refill: 0  3. Other hyperlipidemia Continue statin therapy.  Remain well hydrated.   4. Obesity with current BMI 33.8  [name] is currently in the action stage of change. As such, his goal is to continue with weight loss efforts. He has agreed to the Category 3 Plan.   Exercise goals:  As is.  Behavioral modification strategies: increasing lean protein intake, decreasing simple carbohydrates, meal planning and cooking strategies, keeping healthy foods in the home, and planning for success.  [name] has agreed to follow-up with our clinic in 4-5 weeks. He was informed of the importance of frequent follow-up visits to maximize his success with intensive lifestyle modifications for his multiple health conditions.   Objective:   Blood pressure 112/67, pulse (!) 56, temperature 97.9 F (36.6 C), height '5\' 11"'$  (1.803 m), weight 242 lb (109.8 kg), SpO2 98 %. Body mass index is 33.75 kg/m.  General: Cooperative, alert, well  developed, in no acute distress. HEENT: Conjunctivae and lids unremarkable. Cardiovascular: Regular rhythm.  Lungs: Normal work of breathing. Neurologic: No focal deficits.   Lab Results  Component Value Date   CREATININE 0.85 08/19/2021   BUN 11 08/19/2021   NA 143 08/19/2021   K 4.7 08/19/2021   CL 109 (H) 08/19/2021   CO2 22 08/19/2021   Lab Results  Component Value Date   ALT 105 (H) 08/19/2021   AST 69 (H) 08/19/2021   ALKPHOS 89 08/19/2021   BILITOT  0.7 08/19/2021   Lab Results  Component Value Date   HGBA1C 5.8 (H) 08/19/2021   HGBA1C 5.3 03/12/2021   HGBA1C 6.0 12/11/2020   HGBA1C 5.6 12/29/2019   HGBA1C 6.1 11/03/2019   Lab Results  Component Value Date   INSULIN 22.7 08/19/2021   INSULIN 19.1 04/22/2021   INSULIN 34.7 (H) 01/01/2021   Lab Results  Component Value Date   TSH 0.581 04/22/2021   Lab Results  Component Value Date   CHOL 117 04/22/2021   HDL 41 04/22/2021   LDLCALC 61 04/22/2021   LDLDIRECT 153.1 06/09/2011   TRIG 72 04/22/2021   CHOLHDL 3 12/11/2020   Lab Results  Component Value Date   VD25OH 67.0 04/22/2021   VD25OH 32.6 01/01/2021   Lab Results  Component Value Date   WBC 7.0 04/16/2021   HGB 13.8 04/16/2021   HCT 40.4 04/16/2021   MCV 90.4 04/16/2021   PLT 124 (L) 04/16/2021   Obesity Behavioral Intervention:   Approximately 15 minutes were spent on the discussion below.  ASK: We discussed the diagnosis of obesity with [name] today and Benito agreed to give [place] permission to discuss obesity behavioral modification therapy today.  ASSESS: [name] has the diagnosis of obesity and his BMI today is 33.8. [name] is in the action stage of change.   ADVISE: [name] was educated on the multiple health risks of obesity as well as the benefit of weight loss to improve his health. He was advised of the need for long term treatment and the importance of lifestyle modifications to improve his current health and to decrease his risk of future health problems.  AGREE: Multiple dietary modification options and treatment options were discussed and [name] agreed to follow the recommendations documented in the above note.  ARRANGE: [name] was educated on the importance of frequent visits to treat obesity as outlined per CMS and USPSTF guidelines and agreed to schedule his next follow up appointment today.  Attestation Statements:   Reviewed by clinician on day of visit: allergies, medications,  problem list, medical history, surgical history, family history, social history, and previous encounter notes.  I, Water quality scientist, CMA, am acting as Location manager for [name] Marble, NP.  I have reviewed the above documentation for accuracy and completeness, and I agree with the above. -  Ulrick Methot d. Romonia Yanik, NP-C

## 2021-09-16 DIAGNOSIS — R972 Elevated prostate specific antigen [PSA]: Secondary | ICD-10-CM | POA: Diagnosis not present

## 2021-09-17 ENCOUNTER — Telehealth: Payer: Self-pay | Admitting: Pharmacist

## 2021-09-17 NOTE — Progress Notes (Signed)
Chronic Care Management Pharmacy Assistant   Name: [name]  MRN: [id number] DOB: [birth year]    Reason for Encounter: Disease State   Conditions to be addressed/monitored: HTN   Recent office visits:  None ID  Recent consult visits:  09/13/21 Mina Marble D, NP-Family MedicineNAFLD (nonalcoholic fatty liver disease)Increase metformin to 500 mg with breakfast and with lunch 09/13/21 Mina Marble D, NP-Family [facility] visits:  None in previous 6 months  Medications: Outpatient Encounter Medications as of 09/17/2021  Medication Sig   aspirin 81 MG EC tablet Take 81 mg by mouth daily. Swallow whole.   atorvastatin (LIPITOR) 80 MG tablet Take 1 tablet (80 mg total) by mouth daily at 6 PM.   cholecalciferol (VITAMIN D3) 25 MCG (1000 UNIT) tablet Take 1,000 Units by mouth daily.   clopidogrel (PLAVIX) 75 MG tablet Take 1 tablet (75 mg total) by mouth daily.   levothyroxine (SYNTHROID) 150 MCG tablet TAKE 1 TABLET BY MOUTH EVERY [name] BEFORE BREAKFAST   metFORMIN (GLUCOPHAGE) 500 MG tablet Take 1 tablet (500 mg total) by mouth 2 (two) times daily with a meal. Take with Breakfast and Lunch   metoprolol succinate (TOPROL-XL) 25 MG 24 hr tablet Take 1 tablet (25 mg total) by mouth daily.   nitroGLYCERIN (NITROSTAT) 0.4 MG SL tablet PLACE 1 TABLET (0.4 MG TOTAL) UNDER THE TONGUE EVERY 5 (FIVE) MINUTES AS NEEDED FOR CHEST PAIN.   tamsulosin (FLOMAX) 0.4 MG CAPS capsule Take 0.4 mg by mouth daily.   telmisartan (MICARDIS) 80 MG tablet Take 1 tablet (80 mg total) by mouth daily.   No facility-administered encounter medications on file as of 09/17/2021.    Recent Office Vitals: BP Readings from Last 3 Encounters:  09/13/21 112/67  08/19/21 117/70  07/11/21 110/68   Pulse Readings from Last 3 Encounters:  09/13/21 (!) 56  08/19/21 (!) 55  07/11/21 (!) 53    Wt Readings from Last 3 Encounters:  09/13/21 242 lb (109.8 kg)  08/19/21 241 lb (109.3 kg)   07/11/21 239 lb (108.4 kg)     Kidney Function Lab Results  Component Value Date/Time   CREATININE 0.85 08/19/2021 10:04 AM   CREATININE 0.84 04/22/2021 08:38 AM   GFR 92.31 12/11/2020 09:46 AM   GFRNONAA >60 04/16/2021 03:03 AM   GFRAA 102 01/01/2021 09:25 AM    BMP Latest Ref Rng & Units 08/19/2021 04/22/2021 04/16/2021  Glucose 65 - 99 mg/dL 110(H) 113(H) 157(H)  BUN 8 - 27 mg/dL 11 16 12   Creatinine 0.76 - 1.27 mg/dL 0.85 0.84 0.80  BUN/Creat Ratio 10 - 24 13 19  -  Sodium 134 - 144 mmol/L 143 142 138  Potassium 3.5 - 5.2 mmol/L 4.7 4.3 3.7  Chloride 96 - 106 mmol/L 109(H) 106 110  CO2 20 - 29 mmol/L 22 21 22   Calcium 8.6 - 10.2 mg/dL 9.2 9.3 8.7(L)     Contacted patient on 09/17/21 to discuss hypertension disease state  Current antihypertensive regimen:  Metoprolol succinate 25 mg daily Telmisartan 80 mg daily  Patient verbally confirms he is taking the above medications as directed. Yes  How often are you checking your Blood Pressure?  Patient states that he gets his blood [facility]  he checks his blood pressure in the morning after taking his medication.  Current home BP readings: Patient states last reading was 112/67  Wrist or arm cuff:uses arm cuff Caffeine intake: Salt intake:very little  OTC medications including pseudoephedrine or NSAIDs? none  Any readings above 180/120? No If yes any symptoms of hypertensive emergency? patient denies any symptoms of high blood pressure   What recent interventions/DTPs have been made by any provider to improve Blood Pressure control since last CPP Visit: none noted  Any recent hospitalizations or ED visits since last visit with CPP? No  What diet changes have been made to improve Blood Pressure Control?  Patient states he is following a diet [facility]  What exercise is being done to improve your Blood Pressure Control?  Patient  states he is active outside doing yard work  Adherence Review: Is the patient currently on ACE/ARB medication? Yes Does the patient have >5 [name] gap between last estimated fill dates? No   Star Rating Drugs:  Medication:  Last Fill: [name] Supply Atorvastatin 80 mg 08/17/21  90 Telmisartan 80 mg  09/14/21  90 Metformin 500 mg 09/13/21  60  Care Gaps: Annual wellness visit in last year? Yes Most Recent BP reading:  If Diabetic: Most recent A1C reading:5.3 03/12/21 Last eye exam / retinopathy screening:NA Last diabetic foot exam:NA   Appointment co up on 10/14/21 Weight management center  [place] Pharmacist Assistant [phone]   Time spent:33

## 2021-10-04 DIAGNOSIS — Z23 Encounter for immunization: Secondary | ICD-10-CM | POA: Diagnosis not present

## 2021-10-14 ENCOUNTER — Other Ambulatory Visit: Payer: Self-pay

## 2021-10-14 ENCOUNTER — Ambulatory Visit (INDEPENDENT_AMBULATORY_CARE_PROVIDER_SITE_OTHER): Payer: Medicare Other | Admitting: Adult Health

## 2021-10-14 ENCOUNTER — Encounter (INDEPENDENT_AMBULATORY_CARE_PROVIDER_SITE_OTHER): Payer: Self-pay | Admitting: Adult Health

## 2021-10-14 VITALS — BP 121/66 | HR 88 | Temp 97.8°F | Ht 71.0 in | Wt 246.0 lb

## 2021-10-14 DIAGNOSIS — Z6837 Body mass index (BMI) 37.0-37.9, adult: Secondary | ICD-10-CM

## 2021-10-14 DIAGNOSIS — R7303 Prediabetes: Secondary | ICD-10-CM

## 2021-10-14 DIAGNOSIS — I25750 Atherosclerosis of native coronary artery of transplanted heart with unstable angina: Secondary | ICD-10-CM

## 2021-10-14 NOTE — Progress Notes (Signed)
Chief Complaint:   OBESITY [name] is here to discuss his progress with his obesity treatment plan along with follow-up of his obesity related diagnoses. [name] is on the Category 3 Plan and states he is following his eating plan approximately 60% of the time. [name] states he is swimming and doing yard work 60 minutes 5 times per week.  Today's visit was #: 13 Starting weight: 266 lbs Starting date: 01/01/2021 Today's weight: 246 lbs Today's date: 10/14/2021 Total lbs lost to date: 20 Total lbs lost since last in-office visit: 0  Interim History: [name] started experiencing acute back pain 3 weeks ago and was unable to exercise and experienced increased polyphagia due to pain. He did increase Metformin 500 mg from QD to BID since last OV (09/13/2021). He still reports polyphagia, despite increase in Biguanide.  Subjective:   1. Pre-diabetes 08/19/2021 BG 110, A1c 5.8, and insulin level 22.7- all level elevated. Pt's Metformin 500 mg was increased from QD to BID at last OV (09/13/21) due to afternoon polyphagia- still experiencing cravings. His father was diagnosed with  T2D in his 84's. 08/19/21 CMP- GFR 95.  [name] denies 1st degree family history of MTC or personal history of pancreatitis.  He reports an aunt with thyroid cancer- he is unsure what type of thyroid cancer.  Assessment/Plan:   1. Pre-diabetes Continue Metformin as directed.  At pt's next OV, we will determine whether or not to stay on Metformin or convert to GLP-1 therapy.  2. Obesity with current BMI 34.4  [name] is currently in the action stage of change. As such, his goal is to continue with weight loss efforts. He has agreed to the Category 3 Plan.   Handout: Recipe Guide II  Exercise goals:  As is  Behavioral modification strategies: increasing lean protein intake, decreasing simple carbohydrates, meal planning and cooking strategies, keeping healthy foods in the home, and planning for  success.  [name] has agreed to follow-up with our clinic in 3 weeks. He was informed of the importance of frequent follow-up visits to maximize his success with intensive lifestyle modifications for his multiple health conditions.   Objective:   Blood pressure 121/66, pulse 88, temperature 97.8 F (36.6 C), height 5\' 11"  (1.803 m), weight 246 lb (111.6 kg), SpO2 99 %. Body mass index is 34.31 kg/m.  General: Cooperative, alert, well developed, in no acute distress. HEENT: Conjunctivae and lids unremarkable. Cardiovascular: Regular rhythm.  Lungs: Normal work of breathing. Neurologic: No focal deficits.   Lab Results  Component Value Date   CREATININE 0.85 08/19/2021   BUN 11 08/19/2021   NA 143 08/19/2021   K 4.7 08/19/2021   CL 109 (H) 08/19/2021   CO2 22 08/19/2021   Lab Results  Component Value Date   ALT 105 (H) 08/19/2021   AST 69 (H) 08/19/2021   ALKPHOS 89 08/19/2021   BILITOT 0.7 08/19/2021   Lab Results  Component Value Date   HGBA1C 5.8 (H) 08/19/2021   HGBA1C 5.3 03/12/2021   HGBA1C 6.0 12/11/2020   HGBA1C 5.6 12/29/2019   HGBA1C 6.1 11/03/2019   Lab Results  Component Value Date   INSULIN 22.7 08/19/2021   INSULIN 19.1 04/22/2021   INSULIN 34.7 (H) 01/01/2021   Lab Results  Component Value Date   TSH 0.581 04/22/2021   Lab Results  Component Value Date   CHOL 117 04/22/2021   HDL 41 04/22/2021   LDLCALC 61 04/22/2021   LDLDIRECT 153.1 06/09/2011   TRIG 72  04/22/2021   CHOLHDL 3 12/11/2020   Lab Results  Component Value Date   VD25OH 67.0 04/22/2021   VD25OH 32.6 01/01/2021   Lab Results  Component Value Date   WBC 7.0 04/16/2021   HGB 13.8 04/16/2021   HCT 40.4 04/16/2021   MCV 90.4 04/16/2021   PLT 124 (L) 04/16/2021    Attestation Statements:   Reviewed by clinician on day of visit: allergies, medications, problem list, medical history, surgical history, family history, social history, and previous encounter notes.  Time  spent on visit including pre-visit chart review and post-visit care and charting was 28 minutes.   Coral Ceo, CMA, am acting as transcriptionist for Mina Marble, NP.  I have reviewed the above documentation for accuracy and completeness, and I agree with the above. -  Juanetta Negash d. Mariyah Upshaw, NP-C

## 2021-10-16 ENCOUNTER — Telehealth: Payer: Self-pay

## 2021-10-16 NOTE — Progress Notes (Signed)
Chronic Care Management Pharmacy Assistant   Name: [name]  MRN: [id number] DOB: [birth year]   Reason for Encounter: Disease State   Conditions to be addressed/monitored: HTN   Recent office visits:  None ID  Recent consult visits:  10/14/21 [name] Grandchild, NP-Family Medicine (Pre-diabetes) no med changes  Hospital visits:  None in previous 6 months  Medications: Outpatient Encounter Medications as of 10/16/2021  Medication Sig   aspirin 81 MG EC tablet Take 81 mg by mouth daily. Swallow whole.   atorvastatin (LIPITOR) 80 MG tablet Take 1 tablet (80 mg total) by mouth daily at 6 PM.   cholecalciferol (VITAMIN D3) 25 MCG (1000 UNIT) tablet Take 1,000 Units by mouth daily.   clopidogrel (PLAVIX) 75 MG tablet Take 1 tablet (75 mg total) by mouth daily.   levothyroxine (SYNTHROID) 150 MCG tablet TAKE 1 TABLET BY MOUTH EVERY DAY BEFORE BREAKFAST   metFORMIN (GLUCOPHAGE) 500 MG tablet Take 1 tablet (500 mg total) by mouth 2 (two) times daily with a meal. Take with Breakfast and Lunch   metoprolol succinate (TOPROL-XL) 25 MG 24 hr tablet Take 1 tablet (25 mg total) by mouth daily.   nitroGLYCERIN (NITROSTAT) 0.4 MG SL tablet PLACE 1 TABLET (0.4 MG TOTAL) UNDER THE TONGUE EVERY 5 (FIVE) MINUTES AS NEEDED FOR CHEST PAIN.   tamsulosin (FLOMAX) 0.4 MG CAPS capsule Take 0.4 mg by mouth daily.   telmisartan (MICARDIS) 80 MG tablet Take 1 tablet (80 mg total) by mouth daily.   No facility-administered encounter medications on file as of 10/16/2021.   Reviewed chart prior to disease state call. Spoke with patient regarding BP  Recent Office Vitals: BP Readings from Last 3 Encounters:  10/14/21 121/66  09/13/21 112/67  08/19/21 117/70   Pulse Readings from Last 3 Encounters:  10/14/21 88  09/13/21 (!) 56  08/19/21 (!) 55    Wt Readings from Last 3 Encounters:  10/14/21 246 lb (111.6 kg)  09/13/21 242 lb (109.8 kg)  08/19/21 241 lb (109.3 kg)     Kidney Function Lab  Results  Component Value Date/Time   CREATININE 0.85 08/19/2021 10:04 AM   CREATININE 0.84 04/22/2021 08:38 AM   GFR 92.31 12/11/2020 09:46 AM   GFRNONAA >60 04/16/2021 03:03 AM   GFRAA 102 01/01/2021 09:25 AM    BMP Latest Ref Rng & Units 08/19/2021 04/22/2021 04/16/2021  Glucose 65 - 99 mg/dL 110(H) 113(H) 157(H)  BUN 8 - 27 mg/dL 11 16 12   Creatinine 0.76 - 1.27 mg/dL 0.85 0.84 0.80  BUN/Creat Ratio 10 - 24 13 19  -  Sodium 134 - 144 mmol/L 143 142 138  Potassium 3.5 - 5.2 mmol/L 4.7 4.3 3.7  Chloride 96 - 106 mmol/L 109(H) 106 110  CO2 20 - 29 mmol/L 22 21 22   Calcium 8.6 - 10.2 mg/dL 9.2 9.3 8.7(L)    Current antihypertensive regimen:  Metoprolol succinate 25 mg daily Telmisartan 80 mg daily  How often are you checking your Blood Pressure? infrequently  Current home BP readings: 121/66 on 10/14/21  What recent interventions/DTPs have been made by any provider to improve Blood Pressure control since last CPP Visit: None noted  Any recent hospitalizations or ED visits since last visit with CPP? No  What diet changes have been made to improve Blood Pressure Control?  Patient states he has not made any changes to diet  What exercise is being done to improve your Blood Pressure Control?  Patient states he stays active daily  Adherence Review: Is the patient currently on ACE/ARB medication? Yes Does the patient have >5 day gap between last estimated fill dates? No   Care Gaps: Colonoscopy-04/30/15 Diabetic Foot Exam-NA Ophthalmology-NA Dexa Scan - NA Annual Well Visit - NA Micro albumin-NA Hemoglobin A1c- 08/19/21  Star Rating Drugs: Atorvastatin 80 mg      08/17/21            90 Telmisartan 80 mg      09/14/21            90 Metformin 500 mg       09/13/21            [facility] Clinical Pharmacist Assistant [phone]

## 2021-11-04 ENCOUNTER — Ambulatory Visit (INDEPENDENT_AMBULATORY_CARE_PROVIDER_SITE_OTHER): Payer: Medicare Other | Admitting: Adult Health

## 2021-11-04 DIAGNOSIS — M5459 Other low back pain: Secondary | ICD-10-CM | POA: Diagnosis not present

## 2021-11-04 DIAGNOSIS — M5416 Radiculopathy, lumbar region: Secondary | ICD-10-CM | POA: Diagnosis not present

## 2021-11-05 ENCOUNTER — Other Ambulatory Visit (INDEPENDENT_AMBULATORY_CARE_PROVIDER_SITE_OTHER): Payer: Self-pay | Admitting: Adult Health

## 2021-11-05 DIAGNOSIS — R7303 Prediabetes: Secondary | ICD-10-CM

## 2021-11-05 NOTE — Telephone Encounter (Signed)
LAST APPOINTMENT DATE: 10/14/21 NEXT APPOINTMENT DATE: 11/07/21   [id number] - [place], [place] - [address]  [place] [address] Phone: [phone] Fax: [phone]  Patient is requesting a refill of the following medications: Pending Prescriptions:                       Disp   Refills   metFORMIN (GLUCOPHAGE) 500 MG tablet [Phar*120 ta*0       Sig: Take 1 tablet (500 mg total) by mouth 2 (two) times          daily with a meal. Take with Breakfast and Lunch   Date last filled: 09/08/21 Previously prescribed by [facility]  Lab Results      Component                Value               Date                      HGBA1C                   5.8 (H)             08/19/2021                HGBA1C                   5.3                 03/12/2021                HGBA1C                   6.0                 12/11/2020           Lab Results      Component                Value               Date                      LDLCALC                  61                  04/22/2021                CREATININE               0.85                08/19/2021           Lab Results      Component                Value               Date                      VD25OH                   67.0                04/22/2021  VD25OH                   32.6                01/01/2021            BP Readings from Last 3 Encounters: 10/14/21 : 121/66 09/13/21 : 112/67 08/19/21 : 117/70

## 2021-11-06 DIAGNOSIS — H35373 Puckering of macula, bilateral: Secondary | ICD-10-CM | POA: Diagnosis not present

## 2021-11-06 DIAGNOSIS — H5213 Myopia, bilateral: Secondary | ICD-10-CM | POA: Diagnosis not present

## 2021-11-06 DIAGNOSIS — H524 Presbyopia: Secondary | ICD-10-CM | POA: Diagnosis not present

## 2021-11-07 ENCOUNTER — Encounter (INDEPENDENT_AMBULATORY_CARE_PROVIDER_SITE_OTHER): Payer: Self-pay | Admitting: Adult Health

## 2021-11-07 ENCOUNTER — Ambulatory Visit (INDEPENDENT_AMBULATORY_CARE_PROVIDER_SITE_OTHER): Payer: Medicare Other | Admitting: Adult Health

## 2021-11-07 ENCOUNTER — Other Ambulatory Visit: Payer: Self-pay

## 2021-11-07 VITALS — BP 115/68 | HR 59 | Temp 98.0°F | Ht 71.0 in | Wt 242.0 lb

## 2021-11-07 DIAGNOSIS — R7303 Prediabetes: Secondary | ICD-10-CM | POA: Diagnosis not present

## 2021-11-07 DIAGNOSIS — Z6837 Body mass index (BMI) 37.0-37.9, adult: Secondary | ICD-10-CM | POA: Diagnosis not present

## 2021-11-07 DIAGNOSIS — I10 Essential (primary) hypertension: Secondary | ICD-10-CM | POA: Diagnosis not present

## 2021-11-07 DIAGNOSIS — I25750 Atherosclerosis of native coronary artery of transplanted heart with unstable angina: Secondary | ICD-10-CM

## 2021-11-07 MED ORDER — METFORMIN HCL 500 MG PO TABS: ORAL_TABLET | ORAL | 0 refills | Status: DC

## 2021-11-07 MED ORDER — OZEMPIC (0.25 OR 0.5 MG/DOSE) 2 MG/1.5ML ~~LOC~~ SOPN: 0.5000 mg | PEN_INJECTOR | SUBCUTANEOUS | 0 refills | Status: DC

## 2021-11-07 NOTE — Progress Notes (Signed)
Chief Complaint:   OBESITY [name] is here to discuss his progress with his obesity treatment plan along with follow-up of his obesity related diagnoses. [name] is on the Category 3 Plan and states he is following his eating plan approximately 80% of the time. [name] states he is swimming for 60 minutes 5 times per week.  Today's visit was #: 14 Starting weight: 266 lbs Starting date: 01/01/2021 Today's weight: 242 lbs Today's date: 11/07/2021 Total lbs lost to date: 24 lbs Total lbs lost since last in-office visit: 4 lbs  Interim History: [name] continues to experience polyphagia, even with the increased dose of metformin 500 mg from QD to BID.  Subjective:   1. Pre-diabetes On 08/19/2021, BG 110, A1c 5.8, insulin level 22.9, GFR 95. On 09/13/2021, metformin 500 mg changed from QD to BID. He is still experiencing polyphagia. He denies personal hx of pancreatitis. He reports an aunt with thyroid cancer- he is unsure what type of thyroid cancer.  2. Essential hypertension BP/HR excellent at office visit. He is on Toprol-XL 25 mg QD, Micardis 80 mg QD.  Assessment/Plan:   1. Pre-diabetes Refill metformin 500 mg 1 tablet with breakfast and 1 with lunch. Start Ozempic 0.25 mg once weekly. Stop metformin once starting GLP-1.  - Start Semaglutide,0.25 or 0.5MG /DOS, (OZEMPIC, 0.25 OR 0.5 MG/DOSE,) 2 MG/1.5ML SOPN; Inject 0.5 mg into the skin once a week.  Dispense: 2 mL; Refill: 0 - Refill metFORMIN (GLUCOPHAGE) 500 MG tablet; Take 1 tablet with Breakfast and Lunch  Dispense: 120 tablet; Refill: 0  2. Essential hypertension Continue current antihypertensive therapy per PCP.  3. Obesity with current BMI 34.4  [name] is currently in the action stage of change. As such, his goal is to continue with weight loss efforts. He has agreed to the Category 3 Plan.   Handout:  Thanksgiving Strategies.  Exercise goals:  As is.  Behavioral modification strategies: increasing lean  protein intake, decreasing simple carbohydrates, meal planning and cooking strategies, keeping healthy foods in the home, and planning for success.  [name] has agreed to follow-up with our clinic in 4 weeks. He was informed of the importance of frequent follow-up visits to maximize his success with intensive lifestyle modifications for his multiple health conditions.   Objective:   Blood pressure 115/68, pulse (!) 59, temperature 98 F (36.7 C), height 5\' 11"  (1.803 m), weight 242 lb (109.8 kg), SpO2 98 %. Body mass index is 33.75 kg/m.  General: Cooperative, alert, well developed, in no acute distress. HEENT: Conjunctivae and lids unremarkable. Cardiovascular: Regular rhythm.  Lungs: Normal work of breathing. Neurologic: No focal deficits.   Lab Results  Component Value Date   CREATININE 0.85 08/19/2021   BUN 11 08/19/2021   NA 143 08/19/2021   K 4.7 08/19/2021   CL 109 (H) 08/19/2021   CO2 22 08/19/2021   Lab Results  Component Value Date   ALT 105 (H) 08/19/2021   AST 69 (H) 08/19/2021   ALKPHOS 89 08/19/2021   BILITOT 0.7 08/19/2021   Lab Results  Component Value Date   HGBA1C 5.8 (H) 08/19/2021   HGBA1C 5.3 03/12/2021   HGBA1C 6.0 12/11/2020   HGBA1C 5.6 12/29/2019   HGBA1C 6.1 11/03/2019   Lab Results  Component Value Date   INSULIN 22.7 08/19/2021   INSULIN 19.1 04/22/2021   INSULIN 34.7 (H) 01/01/2021   Lab Results  Component Value Date   TSH 0.581 04/22/2021   Lab Results  Component Value Date  CHOL 117 04/22/2021   HDL 41 04/22/2021   LDLCALC 61 04/22/2021   LDLDIRECT 153.1 06/09/2011   TRIG 72 04/22/2021   CHOLHDL 3 12/11/2020   Lab Results  Component Value Date   VD25OH 67.0 04/22/2021   VD25OH 32.6 01/01/2021   Lab Results  Component Value Date   WBC 7.0 04/16/2021   HGB 13.8 04/16/2021   HCT 40.4 04/16/2021   MCV 90.4 04/16/2021   PLT 124 (L) 04/16/2021   Obesity Behavioral Intervention:   Approximately 15 minutes were spent  on the discussion below.  ASK: We discussed the diagnosis of obesity with [name] today and [name] agreed to give [place] permission to discuss obesity behavioral modification therapy today.  ASSESS: [name] has the diagnosis of obesity and his BMI today is 33.8. [name] is in the action stage of change.   ADVISE: [name] was educated on the multiple health risks of obesity as well as the benefit of weight loss to improve his health. He was advised of the need for long term treatment and the importance of lifestyle modifications to improve his current health and to decrease his risk of future health problems.  AGREE: Multiple dietary modification options and treatment options were discussed and [name] agreed to follow the recommendations documented in the above note.  ARRANGE: [name] was educated on the importance of frequent visits to treat obesity as outlined per CMS and USPSTF guidelines and agreed to schedule his next follow up appointment today.  Attestation Statements:   Reviewed by clinician on day of visit: allergies, medications, problem list, medical history, surgical history, family history, social history, and previous encounter notes.  I, Water quality scientist, CMA, am acting as Location manager for [name] Marble, NP.  I have reviewed the above documentation for accuracy and completeness, and I agree with the above. -  [name] Vinje d. Cleave Ternes, NP-C

## 2021-11-08 ENCOUNTER — Telehealth: Payer: Self-pay | Admitting: *Deleted

## 2021-11-08 NOTE — Telephone Encounter (Signed)
    Patient Name: [name]  DOB: 03/10/1951 MRN: [id number]  Primary Cardiologist: Fransico Him, MD  Chart reviewed as part of pre-operative protocol coverage.   Hx of  inferior STEMI  12/2018 with cath showing 95% PDA that was felt to be the culprit vessel. This was treated with PCI-DES. Also noted was CTO CFX with collaterals from the PDA on OM1. There was an unsuccessful attempt at opening the CFX. Also has 70% mid LAD lesion, to be treated medically. On DAPT with ASA and Plavix.   Can patient hold Plavix as requested?  Please forward your response to P [facility] PREOP.   Thank you   Leanor Kail, PA 11/08/2021, 1:38 PM

## 2021-11-08 NOTE — Telephone Encounter (Signed)
   Pre-operative Risk Assessment    Patient Name: [name]  DOB: 1951/03/22 MRN: [id number]      Request for Surgical Clearance   Procedure:   LUMBAR ESI  Date of Surgery: Clearance 11/16/21                                 Surgeon:  NOT LISTED  Surgeon's Group or Practice Name:  [facility] Phone number:  [phone] Fax number:  [phone] ATTN: Glenna Fellows EXT [phone]   Type of Clearance Requested: - Medical  - Pharmacy:  Hold Clopidogrel (Plavix) x 7 DAYS PRIOR TO INJECTION   Type of Anesthesia:   Not Indicated   Additional requests/questions:   Jiles Prows   11/08/2021, 10:03 AM

## 2021-11-14 DIAGNOSIS — M5459 Other low back pain: Secondary | ICD-10-CM | POA: Diagnosis not present

## 2021-11-14 DIAGNOSIS — M5451 Vertebrogenic low back pain: Secondary | ICD-10-CM | POA: Diagnosis not present

## 2021-11-16 DIAGNOSIS — M5416 Radiculopathy, lumbar region: Secondary | ICD-10-CM | POA: Diagnosis not present

## 2021-11-20 DIAGNOSIS — M5459 Other low back pain: Secondary | ICD-10-CM | POA: Diagnosis not present

## 2021-11-29 ENCOUNTER — Other Ambulatory Visit (INDEPENDENT_AMBULATORY_CARE_PROVIDER_SITE_OTHER): Payer: Self-pay | Admitting: Adult Health

## 2021-11-29 DIAGNOSIS — R7303 Prediabetes: Secondary | ICD-10-CM

## 2021-12-02 NOTE — Telephone Encounter (Signed)
LAST APPOINTMENT DATE: 11/07/21 NEXT APPOINTMENT DATE: 12/07/21   [id number] - [place], [place] - [address] [place] [address] Phone: [phone] Fax: [phone]  Patient is requesting a refill of the following medications: Pending Prescriptions:                       Disp   Refills   metFORMIN (GLUCOPHAGE) 500 MG tablet [Phar*60 tab*1       Sig: TAKE 1 TABLET (500 MG TOTAL) BY MOUTH 2 (TWO) TIMES          DAILY WITH A MEAL. TAKE WITH BREAKFAST AND LUNCH   Date last filled: 11/07/21 Previously prescribed by [facility]  Lab Results      Component                Value               Date                      HGBA1C                   5.8 (H)             08/19/2021                HGBA1C                   5.3                 03/12/2021                HGBA1C                   6.0                 12/11/2020           Lab Results      Component                Value               Date                      LDLCALC                  61                  04/22/2021                CREATININE               0.85                08/19/2021           Lab Results      Component                Value               Date                      VD25OH                   67.0                04/22/2021  VD25OH                   32.6                01/01/2021            BP Readings from Last 3 Encounters: 11/07/21 : 115/68 10/14/21 : 121/66 09/13/21 : 112/67

## 2021-12-09 ENCOUNTER — Other Ambulatory Visit: Payer: Self-pay

## 2021-12-09 ENCOUNTER — Ambulatory Visit (INDEPENDENT_AMBULATORY_CARE_PROVIDER_SITE_OTHER): Payer: Medicare Other | Admitting: Adult Health

## 2021-12-09 ENCOUNTER — Encounter (INDEPENDENT_AMBULATORY_CARE_PROVIDER_SITE_OTHER): Payer: Self-pay | Admitting: Adult Health

## 2021-12-09 VITALS — BP 111/64 | HR 58 | Temp 98.0°F | Ht 71.0 in | Wt 245.0 lb

## 2021-12-09 DIAGNOSIS — I10 Essential (primary) hypertension: Secondary | ICD-10-CM

## 2021-12-09 DIAGNOSIS — I25750 Atherosclerosis of native coronary artery of transplanted heart with unstable angina: Secondary | ICD-10-CM

## 2021-12-09 DIAGNOSIS — R7303 Prediabetes: Secondary | ICD-10-CM

## 2021-12-09 DIAGNOSIS — Z6837 Body mass index (BMI) 37.0-37.9, adult: Secondary | ICD-10-CM | POA: Diagnosis not present

## 2021-12-09 NOTE — Progress Notes (Signed)
Chief Complaint:   OBESITY [name] is here to discuss his progress with his obesity treatment plan along with follow-up of his obesity related diagnoses. [name] is on the Category 3 Plan and states he is following his eating plan approximately 60% of the time. [name] states he is swimming for 60 minutes 5 times per week.  Today's visit was #: 15 Starting weight: 266 lbs Starting date: 01/01/2021 Today's weight: 245 lbs Today's date: 12/09/2021 Total lbs lost to date: 21 lbs Total lbs lost since last in-office visit: 0  Interim History:  Due to continued polyphagia, he was started on Ozempic 0.25 mg at last office visit - 22/16/2022. Medication was on Backorder - then cost prohibitive - he never started GLP-1.  Subjective:   1. Pre-diabetes Due to continued polyphagia, he was started on Ozempic 0.25 mg at last office visit - 22/16/2022. Medication was on Backorder - then cost prohibitive - he never started GLP-1.   2. Essential hypertension He is on Micardis 80 mg daily, Toprol XL 25 mg daily - managed by Cardiology/Dr. Radford Pax. Ambulatory readings:  SBP 120s, DBP 70s - denies acute cardiac symptoms. He denies tobacco/vape use.  Assessment/Plan:   1. Pre-diabetes Take metformin with breakfast - dinner (instead of at lunch). We will discuss GLP-1 therapy after the new year.  2. Essential hypertension Continue with Cardiology as directed.  3. Obesity with current BMI 34.3  [name] is currently in the action stage of change. As such, his goal is to continue with weight loss efforts. He has agreed to the Category 3 Plan.   Exercise goals:  Swimming, walking.  Behavioral modification strategies: increasing lean protein intake, decreasing simple carbohydrates, meal planning and cooking strategies, keeping healthy foods in the home, and planning for success.  [name] has agreed to follow-up with our clinic in 4 weeks, fasting. He was informed of the importance of frequent  follow-up visits to maximize his success with intensive lifestyle modifications for his multiple health conditions.   Objective:   Blood pressure 111/64, pulse (!) 58, temperature 98 F (36.7 C), height 5\' 11"  (1.803 m), weight 245 lb (111.1 kg), SpO2 99 %. Body mass index is 34.17 kg/m.  General: Cooperative, alert, well developed, in no acute distress. HEENT: Conjunctivae and lids unremarkable. Cardiovascular: Regular rhythm.  Lungs: Normal work of breathing. Neurologic: No focal deficits.   Lab Results  Component Value Date   CREATININE 0.85 08/19/2021   BUN 11 08/19/2021   NA 143 08/19/2021   K 4.7 08/19/2021   CL 109 (H) 08/19/2021   CO2 22 08/19/2021   Lab Results  Component Value Date   ALT 105 (H) 08/19/2021   AST 69 (H) 08/19/2021   ALKPHOS 89 08/19/2021   BILITOT 0.7 08/19/2021   Lab Results  Component Value Date   HGBA1C 5.8 (H) 08/19/2021   HGBA1C 5.3 03/12/2021   HGBA1C 6.0 12/11/2020   HGBA1C 5.6 12/29/2019   HGBA1C 6.1 11/03/2019   Lab Results  Component Value Date   INSULIN 22.7 08/19/2021   INSULIN 19.1 04/22/2021   INSULIN 34.7 (H) 01/01/2021   Lab Results  Component Value Date   TSH 0.581 04/22/2021   Lab Results  Component Value Date   CHOL 117 04/22/2021   HDL 41 04/22/2021   LDLCALC 61 04/22/2021   LDLDIRECT 153.1 06/09/2011   TRIG 72 04/22/2021   CHOLHDL 3 12/11/2020   Lab Results  Component Value Date   VD25OH 67.0 04/22/2021   VD25OH  32.6 01/01/2021   Lab Results  Component Value Date   WBC 7.0 04/16/2021   HGB 13.8 04/16/2021   HCT 40.4 04/16/2021   MCV 90.4 04/16/2021   PLT 124 (L) 04/16/2021   Attestation Statements:   Reviewed by clinician on day of visit: allergies, medications, problem list, medical history, surgical history, family history, social history, and previous encounter notes.  Time spent on visit including pre-visit chart review and post-visit care and charting was 28 minutes.   I, Water quality scientist, CMA,  am acting as Location manager for Mina Marble, NP.  I have reviewed the above documentation for accuracy and completeness, and I agree with the above. -  [name] d. Pavneet Markwood, NP-C

## 2022-01-09 ENCOUNTER — Ambulatory Visit (INDEPENDENT_AMBULATORY_CARE_PROVIDER_SITE_OTHER): Payer: Medicare Other | Admitting: Adult Health

## 2022-01-09 ENCOUNTER — Other Ambulatory Visit: Payer: Self-pay

## 2022-01-09 ENCOUNTER — Encounter (INDEPENDENT_AMBULATORY_CARE_PROVIDER_SITE_OTHER): Payer: Self-pay | Admitting: Adult Health

## 2022-01-09 VITALS — BP 121/74 | HR 60 | Temp 97.8°F | Ht 71.0 in | Wt 244.0 lb

## 2022-01-09 DIAGNOSIS — Z6834 Body mass index (BMI) 34.0-34.9, adult: Secondary | ICD-10-CM

## 2022-01-09 DIAGNOSIS — R7989 Other specified abnormal findings of blood chemistry: Secondary | ICD-10-CM | POA: Diagnosis not present

## 2022-01-09 DIAGNOSIS — R7303 Prediabetes: Secondary | ICD-10-CM

## 2022-01-09 DIAGNOSIS — I10 Essential (primary) hypertension: Secondary | ICD-10-CM | POA: Diagnosis not present

## 2022-01-09 DIAGNOSIS — E559 Vitamin D deficiency, unspecified: Secondary | ICD-10-CM

## 2022-01-09 NOTE — Progress Notes (Signed)
Chief Complaint:   OBESITY [name] is here to discuss his progress with his obesity treatment plan along with follow-up of his obesity related diagnoses. [name] is on the Category 3 Plan and states he is following his eating plan approximately 50% of the time. [name] states he is swimming for 60 minutes 5 times per week.  Today's visit was #: 42 Starting weight: 266 lbs Starting date: 01/01/2021 Today's weight: 244 lbs Today's date: 01/09/2022 Total lbs lost to date: 22 lbs Total lbs lost since last in-office visit: 1 lb  Interim History:  [name] had 3 wisdom teeth extracted yesterday - 01/08/2022 - following soft diet. He denies current GI upset.  2023 goals - Accept current physical limitations due to bilateral knee pain/hip pain.   Total Right Hip replacement x3 - last surgery in April 2022.  Subjective:   1. Essential hypertension BP/HR stable at office visit. He denies CP when swimming.  2. Elevated LFTs On 01/08/2022 - 3 wisdom teeth extracted - pain control - 6 doses of acetaminophen in the last 24 hours. He denies RUQ pain.  3. Vitamin D deficiency He is on OTC vitamin D3 1000 IU daily.  4. Pre-diabetes His father had T2D - onset late in life. On 08/19/2021, A1c 5.8.  Assessment/Plan:   1. Essential hypertension Check labs today.  2. Elevated LFTs Will check labs today.  3. Vitamin D deficiency Check vitamin D level today.  4. Pre-diabetes Check labs today.  5. Obesity with current BMI 34.1  [name] is currently in the action stage of change. As such, his goal is to continue with weight loss efforts. He has agreed to the Category 3 Plan.   Exercise goals:  As is.  Behavioral modification strategies: increasing lean protein intake, decreasing simple carbohydrates, meal planning and cooking strategies, keeping healthy foods in the home, and planning for success.  [name] has agreed to follow-up with our clinic in 4 weeks. He was informed of the  importance of frequent follow-up visits to maximize his success with intensive lifestyle modifications for his multiple health conditions.   [name] was informed we would discuss his lab results at his next visit unless there is a critical issue that needs to be addressed sooner. [name] agreed to keep his next visit at the agreed upon time to discuss these results.  Objective:   Blood pressure 121/74, pulse 60, temperature 97.8 F (36.6 C), height 5\' 11"  (1.803 m), weight 244 lb (110.7 kg), SpO2 98 %. Body mass index is 34.03 kg/m.  General: Cooperative, alert, well developed, in no acute distress. HEENT: Conjunctivae and lids unremarkable. Cardiovascular: Regular rhythm.  Lungs: Normal work of breathing. Neurologic: No focal deficits.   Lab Results  Component Value Date   CREATININE 0.85 08/19/2021   BUN 11 08/19/2021   NA 143 08/19/2021   K 4.7 08/19/2021   CL 109 (H) 08/19/2021   CO2 22 08/19/2021   Lab Results  Component Value Date   ALT 105 (H) 08/19/2021   AST 69 (H) 08/19/2021   ALKPHOS 89 08/19/2021   BILITOT 0.7 08/19/2021   Lab Results  Component Value Date   HGBA1C 5.8 (H) 08/19/2021   HGBA1C 5.3 03/12/2021   HGBA1C 6.0 12/11/2020   HGBA1C 5.6 12/29/2019   HGBA1C 6.1 11/03/2019   Lab Results  Component Value Date   INSULIN 22.7 08/19/2021   INSULIN 19.1 04/22/2021   INSULIN 34.7 (H) 01/01/2021   Lab Results  Component Value Date  TSH 0.581 04/22/2021   Lab Results  Component Value Date   CHOL 117 04/22/2021   HDL 41 04/22/2021   LDLCALC 61 04/22/2021   LDLDIRECT 153.1 06/09/2011   TRIG 72 04/22/2021   CHOLHDL 3 12/11/2020   Lab Results  Component Value Date   VD25OH 67.0 04/22/2021   VD25OH 32.6 01/01/2021   Lab Results  Component Value Date   WBC 7.0 04/16/2021   HGB 13.8 04/16/2021   HCT 40.4 04/16/2021   MCV 90.4 04/16/2021   PLT 124 (L) 04/16/2021   Obesity Behavioral Intervention:   Approximately 15 minutes were spent on the  discussion below.  ASK: We discussed the diagnosis of obesity with [name] today and [name] agreed to give [place] permission to discuss obesity behavioral modification therapy today.  ASSESS: [name] has the diagnosis of obesity and his BMI today is 34.1. [name] is in the action stage of change.   ADVISE: [name] was educated on the multiple health risks of obesity as well as the benefit of weight loss to improve his health. He was advised of the need for long term treatment and the importance of lifestyle modifications to improve his current health and to decrease his risk of future health problems.  AGREE: Multiple dietary modification options and treatment options were discussed and [name] agreed to follow the recommendations documented in the above note.  ARRANGE: [name] was educated on the importance of frequent visits to treat obesity as outlined per CMS and USPSTF guidelines and agreed to schedule his next follow up appointment today.  Attestation Statements:   Reviewed by clinician on day of visit: allergies, medications, problem list, medical history, surgical history, family history, social history, and previous encounter notes.  I, Water quality scientist, CMA, am acting as Location manager for Mina Marble, NP.  I have reviewed the above documentation for accuracy and completeness, and I agree with the above. - Jarvin Ogren d. Javaya [place], NP-C

## 2022-01-09 NOTE — Addendum Note (Signed)
Addended by: Lebron Conners on: 01/09/2022 02:38 PM   Modules accepted: Orders

## 2022-01-10 LAB — HEMOGLOBIN A1C
Est. average glucose Bld gHb Est-mCnc: 111 mg/dL
Hgb A1c MFr Bld: 5.5 % (ref 4.8–5.6)

## 2022-01-10 LAB — COMPREHENSIVE METABOLIC PANEL
ALT: 41 IU/L (ref 0–44)
AST: 34 IU/L (ref 0–40)
Albumin/Globulin Ratio: 1.7 (ref 1.2–2.2)
Albumin: 4.5 g/dL (ref 3.8–4.8)
Alkaline Phosphatase: 87 IU/L (ref 44–121)
BUN/Creatinine Ratio: 18 (ref 10–24)
BUN: 15 mg/dL (ref 8–27)
Bilirubin Total: 0.8 mg/dL (ref 0.0–1.2)
CO2: 22 mmol/L (ref 20–29)
Calcium: 9.3 mg/dL (ref 8.6–10.2)
Chloride: 105 mmol/L (ref 96–106)
Creatinine, Ser: 0.84 mg/dL (ref 0.76–1.27)
Globulin, Total: 2.6 g/dL (ref 1.5–4.5)
Glucose: 89 mg/dL (ref 70–99)
Potassium: 4.5 mmol/L (ref 3.5–5.2)
Sodium: 141 mmol/L (ref 134–144)
Total Protein: 7.1 g/dL (ref 6.0–8.5)
eGFR: 95 mL/min/{1.73_m2} (ref >59)

## 2022-01-10 LAB — INSULIN, RANDOM: INSULIN: 8.4 u[IU]/mL (ref 2.6–24.9)

## 2022-01-10 LAB — VITAMIN D 25 HYDROXY (VIT D DEFICIENCY, FRACTURES): Vit D, 25-Hydroxy: 42.5 ng/mL (ref 30.0–100.0)

## 2022-01-28 ENCOUNTER — Other Ambulatory Visit (INDEPENDENT_AMBULATORY_CARE_PROVIDER_SITE_OTHER): Payer: Self-pay | Admitting: Adult Health

## 2022-01-28 DIAGNOSIS — R7303 Prediabetes: Secondary | ICD-10-CM

## 2022-01-28 NOTE — Telephone Encounter (Signed)
LAST APPOINTMENT DATE: 01/09/22 NEXT APPOINTMENT DATE: 02/06/22   [id number] - [place], [place] - [address]  [place] [address] Phone: [phone] Fax: [phone]  Patient is requesting a refill of the following medications: Pending Prescriptions:                       Disp   Refills   metFORMIN (GLUCOPHAGE) 500 MG tablet [Phar*120 ta*        Sig: TAKE 1 TABLET WITH BREAKFAST AND LUNCH   Date last filled: 12/02/21 Previously prescribed by [facility]  Lab Results      Component                Value               Date                      HGBA1C                   5.5                 01/09/2022                HGBA1C                   5.8 (H)             08/19/2021                HGBA1C                   5.3                 03/12/2021           Lab Results      Component                Value               Date                      LDLCALC                  61                  04/22/2021                CREATININE               0.84                01/09/2022           Lab Results      Component                Value               Date                      VD25OH                   42.5                01/09/2022                VD25OH  67.0                04/22/2021                VD25OH                   32.6                01/01/2021            BP Readings from Last 3 Encounters: 01/09/22 : 121/74 12/09/21 : 111/64 11/07/21 : 115/68

## 2022-02-06 ENCOUNTER — Encounter (INDEPENDENT_AMBULATORY_CARE_PROVIDER_SITE_OTHER): Payer: Self-pay | Admitting: Adult Health

## 2022-02-06 ENCOUNTER — Ambulatory Visit (INDEPENDENT_AMBULATORY_CARE_PROVIDER_SITE_OTHER): Payer: Medicare Other | Admitting: Adult Health

## 2022-02-06 ENCOUNTER — Other Ambulatory Visit: Payer: Self-pay

## 2022-02-06 VITALS — BP 117/68 | HR 58 | Temp 97.7°F | Ht 71.0 in | Wt 243.0 lb

## 2022-02-06 DIAGNOSIS — R7303 Prediabetes: Secondary | ICD-10-CM | POA: Diagnosis not present

## 2022-02-06 DIAGNOSIS — I10 Essential (primary) hypertension: Secondary | ICD-10-CM | POA: Diagnosis not present

## 2022-02-06 DIAGNOSIS — Z6833 Body mass index (BMI) 33.0-33.9, adult: Secondary | ICD-10-CM | POA: Diagnosis not present

## 2022-02-06 DIAGNOSIS — R7989 Other specified abnormal findings of blood chemistry: Secondary | ICD-10-CM

## 2022-02-06 DIAGNOSIS — E669 Obesity, unspecified: Secondary | ICD-10-CM | POA: Diagnosis not present

## 2022-02-06 DIAGNOSIS — E66812 Obesity, class 2: Secondary | ICD-10-CM

## 2022-02-06 DIAGNOSIS — E559 Vitamin D deficiency, unspecified: Secondary | ICD-10-CM

## 2022-02-06 NOTE — Progress Notes (Signed)
Chief Complaint:   OBESITY [name] is here to discuss his progress with his obesity treatment plan along with follow-up of his obesity related diagnoses. [name] is on the Category 3 Plan and states he is following his eating plan approximately 70% of the time. [name] states he is swimming for 60 minutes 5 times per week.  Today's visit was #: 33 Starting weight: 266 lbs Starting date: 01/01/2021 Today's weight: 243 lbs Today's date: 02/06/2022 Total lbs lost to date: 23 lbs Total lbs lost since last in-office visit: 1 lb  Interim History:  His sister (78 years old) was recently diagnosed with pancreatic cancer.   Grandson fractured his right arm - right hand dominant - [employer] at [employer].  Subjective:   1. Elevated LFTs Discussed labs with patient today.  On 01/09/2022, CMP - LFTs - normal! He denies RUQ pain.  2. Vitamin D deficiency Worsening.  Discussed labs with patient today.  On 01/09/2022, vitamin D level - 42.5 - below goal of 50-70. He is on OTC vitamin D3 1000 IU daily - he increased to 2000 IU daily last week.  3. Pre-diabetes Discussed labs with patient today.  On 01/09/2022, BG 89, A1c 5.5, insulin level 8.4 - all improved! On 01/09/2022, CMP - GFR >60. He is on metformin 500 mg BID.  4. Essential hypertension Discussed labs with patient today.  On 01/25/2022, CMP - electrolytes and kidney function - normal.  Assessment/Plan:   1. Elevated LFTs Avoid hepatotoxic substances. Continue with weight loss efforts.  2. Vitamin D deficiency Stop OTC Vit D supplementation. Start ergocalciferol 50,000 IU once weekly.  3. Pre-diabetes Continue Metformin 500mg  BID.  4. Essential hypertension Continue telmisartan 80 mg daily, metoprolol succinate XL 25 mg daily.  5. Obesity with current BMI 33.9  [name] is currently in the action stage of change. As such, his goal is to continue with weight loss efforts. He has agreed to the Category 3 Plan.   Exercise  goals:  As is.  Behavioral modification strategies: increasing lean protein intake, decreasing simple carbohydrates, meal planning and cooking strategies, keeping healthy foods in the home, and planning for success.  [name] has agreed to follow-up with our clinic in 4 weeks. He was informed of the importance of frequent follow-up visits to maximize his success with intensive lifestyle modifications for his multiple health conditions.   Objective:   Blood pressure 117/68, pulse (!) 58, temperature 97.7 F (36.5 C), height 5\' 11"  (1.803 m), weight 243 lb (110.2 kg), SpO2 96 %. Body mass index is 33.89 kg/m.  General: Cooperative, alert, well developed, in no acute distress. HEENT: Conjunctivae and lids unremarkable. Cardiovascular: Regular rhythm.  Lungs: Normal work of breathing. Neurologic: No focal deficits.   Lab Results  Component Value Date   CREATININE 0.84 01/09/2022   BUN 15 01/09/2022   NA 141 01/09/2022   K 4.5 01/09/2022   CL 105 01/09/2022   CO2 22 01/09/2022   Lab Results  Component Value Date   ALT 41 01/09/2022   AST 34 01/09/2022   ALKPHOS 87 01/09/2022   BILITOT 0.8 01/09/2022   Lab Results  Component Value Date   HGBA1C 5.5 01/09/2022   HGBA1C 5.8 (H) 08/19/2021   HGBA1C 5.3 03/12/2021   HGBA1C 6.0 12/11/2020   HGBA1C 5.6 12/29/2019   Lab Results  Component Value Date   INSULIN 8.4 01/09/2022   INSULIN 22.7 08/19/2021   INSULIN 19.1 04/22/2021   INSULIN 34.7 (H) 01/01/2021  Lab Results  Component Value Date   TSH 0.581 04/22/2021   Lab Results  Component Value Date   CHOL 117 04/22/2021   HDL 41 04/22/2021   LDLCALC 61 04/22/2021   LDLDIRECT 153.1 06/09/2011   TRIG 72 04/22/2021   CHOLHDL 3 12/11/2020   Lab Results  Component Value Date   VD25OH 42.5 01/09/2022   VD25OH 67.0 04/22/2021   VD25OH 32.6 01/01/2021   Lab Results  Component Value Date   WBC 7.0 04/16/2021   HGB 13.8 04/16/2021   HCT 40.4 04/16/2021   MCV 90.4  04/16/2021   PLT 124 (L) 04/16/2021   Obesity Behavioral Intervention:   Approximately 15 minutes were spent on the discussion below.  ASK: We discussed the diagnosis of obesity with [name] today and [name] agreed to give [place] permission to discuss obesity behavioral modification therapy today.  ASSESS: [name] has the diagnosis of obesity and his BMI today is 33.9. [name] is in the action stage of change.   ADVISE: [name] was educated on the multiple health risks of obesity as well as the benefit of weight loss to improve his health. He was advised of the need for long term treatment and the importance of lifestyle modifications to improve his current health and to decrease his risk of future health problems.  AGREE: Multiple dietary modification options and treatment options were discussed and [name] agreed to follow the recommendations documented in the above note.  ARRANGE: [name] was educated on the importance of frequent visits to treat obesity as outlined per CMS and USPSTF guidelines and agreed to schedule his next follow up appointment today.  Attestation Statements:   Reviewed by clinician on day of visit: allergies, medications, problem list, medical history, surgical history, family history, social history, and previous encounter notes.  I, Water quality scientist, CMA, am acting as Location manager for Mina Marble, NP.  I have reviewed the above documentation for accuracy and completeness, and I agree with the above. -  Gabrille Kilbride d. Elin Fenley, NP-C

## 2022-02-10 ENCOUNTER — Encounter (INDEPENDENT_AMBULATORY_CARE_PROVIDER_SITE_OTHER): Payer: Self-pay | Admitting: Adult Health

## 2022-02-10 NOTE — Telephone Encounter (Signed)
Katy ?

## 2022-02-11 ENCOUNTER — Other Ambulatory Visit (INDEPENDENT_AMBULATORY_CARE_PROVIDER_SITE_OTHER): Payer: Self-pay | Admitting: Adult Health

## 2022-02-11 MED ORDER — VITAMIN D (ERGOCALCIFEROL) 1.25 MG (50000 UNIT) PO CAPS: 50000.0000 [IU] | ORAL_CAPSULE | ORAL | 0 refills | Status: DC

## 2022-02-11 NOTE — Telephone Encounter (Signed)
LAST APPOINTMENT DATE: 02/06/22 NEXT APPOINTMENT DATE: 03/10/22   [id number] - [place], [place] - [address] [place] [address] Phone: [phone] Fax: [phone]  Patient is requesting a refill of the following medications: No prescriptions requested or ordered in this encounter   Date last filled: not filled was on OTC Previously prescribed by    Lab Results      Component                Value               Date                      HGBA1C                   5.5                 01/09/2022                HGBA1C                   5.8 (H)             08/19/2021                HGBA1C                   5.3                 03/12/2021           Lab Results      Component                Value               Date                      LDLCALC                  61                  04/22/2021                CREATININE               0.84                01/09/2022           Lab Results      Component                Value               Date                      VD25OH                   42.5                01/09/2022                VD25OH                   67.0                04/22/2021                VD25OH  32.6                01/01/2021            BP Readings from Last 3 Encounters: 02/06/22 : 117/68 01/09/22 : 121/74 12/09/21 : 111/64

## 2022-02-17 ENCOUNTER — Ambulatory Visit (INDEPENDENT_AMBULATORY_CARE_PROVIDER_SITE_OTHER): Payer: Medicare Other

## 2022-02-17 DIAGNOSIS — Z0000 Encounter for general adult medical examination without abnormal findings: Secondary | ICD-10-CM | POA: Diagnosis not present

## 2022-02-17 NOTE — Patient Instructions (Signed)
[name] , Thank you for taking time to come for your Medicare Wellness Visit. I appreciate your ongoing commitment to your health goals. Please review the following plan we discussed and let me know if I can assist you in the future.   Screening recommendations/referrals: Colonoscopy: 04/30/2015  due 2026 Recommended yearly ophthalmology/optometry visit for glaucoma screening and checkup Recommended yearly dental visit for hygiene and checkup  Vaccinations: Influenza vaccine: completed  Pneumococcal vaccine: completed  Tdap vaccine: 03/28/2021 Shingles vaccine: completed     Advanced directives: yes   Conditions/risks identified: none   Next appointment: none   Preventive Care 69 Years and Older, Male Preventive care refers to lifestyle choices and visits with your health care provider that can promote health and wellness. What does preventive care include? A yearly physical exam. This is also called an annual well check. Dental exams once or twice a year. Routine eye exams. Ask your health care provider how often you should have your eyes checked. Personal lifestyle choices, including: Daily care of your teeth and gums. Regular physical activity. Eating a healthy diet. Avoiding tobacco and drug use. Limiting alcohol use. Practicing safe sex. Taking low doses of aspirin every day. Taking vitamin and mineral supplements as recommended by your health care provider. What happens during an annual well check? The services and screenings done by your health care provider during your annual well check will depend on your age, overall health, lifestyle risk factors, and family history of disease. Counseling  Your health care provider may ask you questions about your: Alcohol use. Tobacco use. Drug use. Emotional well-being. Home and relationship well-being. Sexual activity. Eating habits. History of falls. Memory and ability to understand (cognition). Work and work  Statistician. Screening  You may have the following tests or measurements: Height, weight, and BMI. Blood pressure. Lipid and cholesterol levels. These may be checked every 5 years, or more frequently if you are over 69 years old. Skin check. Lung cancer screening. You may have this screening every year starting at age 69 if you have a 30-pack-year history of smoking and currently smoke or have quit within the past 15 years. Fecal occult blood test (FOBT) of the stool. You may have this test every year starting at age 69. Flexible sigmoidoscopy or colonoscopy. You may have a sigmoidoscopy every 5 years or a colonoscopy every 10 years starting at age 69. Prostate cancer screening. Recommendations will vary depending on your family history and other risks. Hepatitis C blood test. Hepatitis B blood test. Sexually transmitted disease (STD) testing. Diabetes screening. This is done by checking your blood sugar (glucose) after you have not eaten for a while (fasting). You may have this done every 1-3 years. Abdominal aortic aneurysm (AAA) screening. You may need this if you are a current or former smoker. Osteoporosis. You may be screened starting at age 69 if you are at high risk. Talk with your health care provider about your test results, treatment options, and if necessary, the need for more tests. Vaccines  Your health care provider may recommend certain vaccines, such as: Influenza vaccine. This is recommended every year. Tetanus, diphtheria, and acellular pertussis (Tdap, Td) vaccine. You may need a Td booster every 10 years. Zoster vaccine. You may need this after age 69. Pneumococcal 13-valent conjugate (PCV13) vaccine. One dose is recommended after age 69. Pneumococcal polysaccharide (PPSV23) vaccine. One dose is recommended after age 69. Talk to your health care provider about which screenings and vaccines you need and how  often you need them. This information is not intended to replace  advice given to you by your health care provider. Make sure you discuss any questions you have with your health care provider. Document Released: 01/11/2016 Document Revised: 09/03/2016 Document Reviewed: 10/16/2015 Elsevier Interactive Patient Education  2017 [place] Prevention in the Home Falls can cause injuries. They can happen to people of all ages. There are many things you can do to make your home safe and to help prevent falls. What can I do on the outside of my home? Regularly fix the edges of walkways and driveways and fix any cracks. Remove anything that might make you trip as you walk through a door, such as a raised step or threshold. Trim any bushes or trees on the path to your home. Use bright outdoor lighting. Clear any walking paths of anything that might make someone trip, such as rocks or tools. Regularly check to see if handrails are loose or broken. Make sure that both sides of any steps have handrails. Any raised decks and porches should have guardrails on the edges. Have any leaves, snow, or ice cleared regularly. Use sand or salt on walking paths during winter. Clean up any spills in your garage right away. This includes oil or grease spills. What can I do in the bathroom? Use night lights. Install grab bars by the toilet and in the tub and shower. Do not use towel bars as grab bars. Use non-skid mats or decals in the tub or shower. If you need to sit down in the shower, use a plastic, non-slip stool. Keep the floor dry. Clean up any water that spills on the floor as soon as it happens. Remove soap buildup in the tub or shower regularly. Attach bath mats securely with double-sided non-slip rug tape. Do not have throw rugs and other things on the floor that can make you trip. What can I do in the bedroom? Use night lights. Make sure that you have a light by your bed that is easy to reach. Do not use any sheets or blankets that are too big for your bed.  They should not hang down onto the floor. Have a firm chair that has side arms. You can use this for support while you get dressed. Do not have throw rugs and other things on the floor that can make you trip. What can I do in the kitchen? Clean up any spills right away. Avoid walking on wet floors. Keep items that you use a lot in easy-to-reach places. If you need to reach something above you, use a strong step stool that has a grab bar. Keep electrical cords out of the way. Do not use floor polish or wax that makes floors slippery. If you must use wax, use non-skid floor wax. Do not have throw rugs and other things on the floor that can make you trip. What can I do with my stairs? Do not leave any items on the stairs. Make sure that there are handrails on both sides of the stairs and use them. Fix handrails that are broken or loose. Make sure that handrails are as long as the stairways. Check any carpeting to make sure that it is firmly attached to the stairs. Fix any carpet that is loose or worn. Avoid having throw rugs at the top or bottom of the stairs. If you do have throw rugs, attach them to the floor with carpet tape. Make sure that you have a  light switch at the top of the stairs and the bottom of the stairs. If you do not have them, ask someone to add them for you. What else can I do to help prevent falls? Wear shoes that: Do not have high heels. Have rubber bottoms. Are comfortable and fit you well. Are closed at the toe. Do not wear sandals. If you use a stepladder: Make sure that it is fully opened. Do not climb a closed stepladder. Make sure that both sides of the stepladder are locked into place. Ask someone to hold it for you, if possible. Clearly mark and make sure that you can see: Any grab bars or handrails. First and last steps. Where the edge of each step is. Use tools that help you move around (mobility aids) if they are needed. These  include: Canes. Walkers. Scooters. Crutches. Turn on the lights when you go into a dark area. Replace any light bulbs as soon as they burn out. Set up your furniture so you have a clear path. Avoid moving your furniture around. If any of your floors are uneven, fix them. If there are any pets around you, be aware of where they are. Review your medicines with your doctor. Some medicines can make you feel dizzy. This can increase your chance of falling. Ask your doctor what other things that you can do to help prevent falls. This information is not intended to replace advice given to you by your health care provider. Make sure you discuss any questions you have with your health care provider. Document Released: 10/11/2009 Document Revised: 05/22/2016 Document Reviewed: 01/19/2015 Elsevier Interactive Patient Education  2017 [employer].

## 2022-02-17 NOTE — Progress Notes (Signed)
Subjective:   [name] is a 71 y.o. male who presents for an Subsequent  Medicare Annual Wellness Visit.  Review of Systems     Cardiac Risk Factors include: advanced age (>54men, >90 women);dyslipidemia;hypertension;male gender     Objective:    Today's Vitals   There is no height or weight on file to calculate BMI.  Advanced Directives 02/17/2022 04/15/2021 04/05/2021 03/12/2021 03/12/2021 01/29/2021 11/26/2020  Does Patient Have a Medical Advance Directive? Yes Yes Yes Yes Yes Yes No  Type of Paramedic of [place];Living will [place];Living will [place];Living will [place];Living will [place];Living will - -  Does patient want to make changes to medical advance directive? - No - Patient declined - No - Patient declined - No - Patient declined -  Copy of [place] in Chart? No - copy requested No - copy requested Yes - validated most recent copy scanned in chart (See row information) No - copy requested - - -  Would patient like information on creating a medical advance directive? - No - Patient declined - - - - No - Patient declined  Pre-existing out of facility DNR order (yellow form or pink MOST form) - - - - - - -    Current Medications (verified) Outpatient Encounter Medications as of 02/17/2022  Medication Sig   aspirin 81 MG EC tablet Take 81 mg by mouth daily. Swallow whole.   atorvastatin (LIPITOR) 80 MG tablet Take 1 tablet (80 mg total) by mouth daily at 6 PM.   clopidogrel (PLAVIX) 75 MG tablet Take 1 tablet (75 mg total) by mouth daily.   levothyroxine (SYNTHROID) 150 MCG tablet TAKE 1 TABLET BY MOUTH EVERY DAY BEFORE BREAKFAST   metFORMIN (GLUCOPHAGE) 500 MG tablet TAKE 1 TABLET WITH BREAKFAST AND LUNCH   metoprolol succinate (TOPROL-XL) 25 MG 24 hr tablet Take 1 tablet (25 mg total) by mouth daily.   nitroGLYCERIN (NITROSTAT) 0.4 MG SL  tablet PLACE 1 TABLET (0.4 MG TOTAL) UNDER THE TONGUE EVERY 5 (FIVE) MINUTES AS NEEDED FOR CHEST PAIN.   tamsulosin (FLOMAX) 0.4 MG CAPS capsule Take 0.4 mg by mouth daily.   telmisartan (MICARDIS) 80 MG tablet Take 1 tablet (80 mg total) by mouth daily.   Vitamin D, Ergocalciferol, (DRISDOL) 1.25 MG (50000 UNIT) CAPS capsule Take 1 capsule (50,000 Units total) by mouth every 7 (seven) days.   No facility-administered encounter medications on file as of 02/17/2022.    Allergies (verified) Patient has no known allergies.   History: Past Medical History:  Diagnosis Date   Back pain    BPH (benign prostatic hypertrophy)    Chest pain    COLONIC POLYPS, HX OF 2007   clear colo w/o polyps 04/2015: 58yr follow up   Constipation    Coronary artery disease    Decreased mobility    due to BL knee and hip replacement   Fatigue    GERD (gastroesophageal reflux disease)    Heartburn    History of kidney stones    passed   Hyperlipidemia    HYPERTENSION    HYPOTHYROIDISM    Joint pain    Myocardial infarction ([place]) 12/2018   Inferior STEMI   OA (osteoarthritis)    Knees   OBESITY    OSA treated with BiPAP    PAF (paroxysmal atrial fibrillation) (HCC)    Pre-diabetes    Shortness of breath on exertion  Umbilical hernia    Ventral hernia    Past Surgical History:  Procedure Laterality Date   arthroscopic knee surgery     (R) 2003 & (L) 2010   CARDIAC CATHETERIZATION     COLONOSCOPY  04/2015   no polyps ([place])   CORONARY/GRAFT ACUTE MI REVASCULARIZATION N/A 12/30/2018   Procedure: Coronary/Graft Acute MI Revascularization;  Surgeon: Belva Crome, MD;  Location: [facility];  Service: Cardiovascular;  Laterality: N/A;   HIP CLOSED REDUCTION Right 03/12/2021   Procedure: CLOSED REDUCTION HIP;  Surgeon: Melina Schools, MD;  Location: [facility];  Service: Orthopedics;  Laterality: Right;   LAMINECTOMY  1991   LEFT HEART CATH AND CORONARY ANGIOGRAPHY N/A 12/30/2018   Procedure:  LEFT HEART CATH AND CORONARY ANGIOGRAPHY;  Surgeon: Belva Crome, MD;  Location: [place] [facility];  Service: Cardiovascular;  Laterality: N/A;   TONSILLECTOMY  1990   w/ adenoids and uvula   TOTAL HIP ARTHROPLASTY Right 01/2011   [place]   TOTAL HIP ARTHROPLASTY Left 04/16/2016   Procedure: TOTAL HIP ARTHROPLASTY ANTERIOR APPROACH;  Surgeon: Gaynelle Arabian, MD;  Location: [facility];  Service: Orthopedics;  Laterality: Left;   TOTAL HIP REVISION Right 01/04/2020   Procedure: Right hip bearing surface;  Surgeon: Gaynelle Arabian, MD;  Location: [facility];  Service: Orthopedics;  Laterality: Right;  162min   TOTAL HIP REVISION Right 04/15/2021   Procedure: Right hip bearing surface revision;  Surgeon: Gaynelle Arabian, MD;  Location: [facility];  Service: Orthopedics;  Laterality: Right;  26min   TOTAL KNEE ARTHROPLASTY  12/27/2012   Procedure: TOTAL KNEE BILATERAL;  Surgeon: Gearlean Alf, MD;  Location: [facility];  Service: Orthopedics;  Laterality: Bilateral;   Family History  Problem Relation Age of Onset   Heart disease Father 97       CABG age 47   Hypertension Father    Diabetes Father    Hyperlipidemia Father    Cancer Father    Dementia Mother        ?ALS vs SNP   ALS Maternal Grandfather    Arthritis Other        Parent & grandparents   Colon cancer Neg Hx    Social History   Socioeconomic History   Marital status: Married    Spouse name: Rondell Pardon   Number of children: Not on file   Years of education: Not on file   Highest education level: Not on file  Occupational History   Occupation: Retired  Tobacco Use   Smoking status: Never   Smokeless tobacco: Never  Vaping Use   Vaping Use: Never used  Substance and Sexual Activity   Alcohol use: Yes    Alcohol/week: 0.0 standard drinks    Comment: rarely, social   Drug use: No   Sexual activity: Not on file  Other Topics Concern   Not on file  Social History Narrative   Working part time, contemplating retirement end of 2016    Lives at home with spouse      Exercise: water exercises   Social Determinants of Health   Financial Resource Strain: Low Risk    Difficulty of Paying Living Expenses: Not hard at all  Food Insecurity: No Food Insecurity   Worried About Charity fundraiser in the Last Year: Never true   Arboriculturist in the Last Year: Never true  Transportation Needs: No Transportation Needs   Lack of Transportation (Medical): No   Lack  of Transportation (Non-Medical): No  Physical Activity: Sufficiently Active   Days of Exercise per Week: 5 days   Minutes of Exercise per Session: 60 min  Stress: No Stress Concern Present   Feeling of Stress : Not at all  Social Connections: Socially Integrated   Frequency of Communication with Friends and Family: Twice a week   Frequency of Social Gatherings with Friends and Family: Twice a week   Attends Religious Services: More than 4 times per year   Active Member of [employer] or Organizations: Yes   Attends Music therapist: More than 4 times per year   Marital Status: Married    Tobacco Counseling Counseling given: Not Answered   Clinical Intake:  Pre-visit preparation completed: Yes  Pain : No/denies pain     Nutritional Risks: None  How often do you need to have someone help you when you read instructions, pamphlets, or other written materials from your doctor or pharmacy?: 1 - Never What is the last grade level you completed in school?: college  Diabetic?no  Interpreter Needed?: No  Information entered by :: L,Yailin Biederman,LPN   Activities of Daily Living In your present state of health, do you have any difficulty performing the following activities: 02/17/2022 04/15/2021  Hearing? N N  Vision? N N  Difficulty concentrating or making decisions? N N  Walking or climbing stairs? N Y  Comment - -  Dressing or bathing? N N  Doing errands, shopping? N N  Preparing Food and eating ? N -  Using the Toilet? N -  In the past six  months, have you accidently leaked urine? N -  Do you have problems with loss of bowel control? N -  Managing your Medications? N -  Managing your Finances? N -  Housekeeping or managing your Housekeeping? N -  Some recent data might be hidden    Patient Care Team: Binnie Rail, MD as PCP - General (Internal Medicine) Sueanne Margarita, MD as PCP - Cardiology (Cardiology) Gaynelle Arabian, MD as Consulting Physician (Orthopedic Surgery) Carolan Clines, MD (Inactive) (Urology) [place], Lajuan Lines, MD as Consulting Physician (Gastroenterology) Charlton Haws, [facility] as Pharmacist (Pharmacist) Melissa Noon, OD as Referring Physician (Optometry)  Indicate any recent Medical Services you may have received from other than [facility] providers in the past year (date may be approximate).     Assessment:   This is a routine wellness examination for [facility].  Hearing/Vision screen Vision Screening - Comments:: Annual eye exam wear glasses   Dietary issues and exercise activities discussed: Current Exercise Habits: Home exercise routine, Type of exercise: Other - see comments (swimming), Time (Minutes): 60, Frequency (Times/Week): 5, Weekly Exercise (Minutes/Week): 300, Intensity: Mild, Exercise limited by: None identified   Goals Addressed             This Visit's Progress    Patient Stated   On track    To maintain my current health status by continuing to eat healthy, stay physically and socially. Also I plan to continue swimming 4-5 days out of the week for 45 minutes-1 hours.       Depression Screen PHQ 2/9 Scores 02/17/2022 02/17/2022 01/29/2021 01/01/2021 12/11/2020 08/03/2019 07/18/2019  PHQ - 2 Score 0 0 0 3 0 0 0  PHQ- 9 Score - - - 15 - - -    Fall Risk Fall Risk  02/17/2022 01/29/2021 12/11/2020 01/20/2019 08/19/2018  Falls in the past year? 0 0 0 0 No  Number falls in  past yr: 0 0 0 0 -  Injury with Fall? 0 0 0 0 -  Risk for fall due to : - No Fall Risks No Fall Risks Other  (Comment) -  Risk for fall due to: Comment - - - Single Leg Stand  -  Follow up Falls evaluation completed Falls evaluation completed Falls evaluation completed Falls evaluation completed -    FALL RISK PREVENTION PERTAINING TO THE HOME:  Any stairs in or around the home? Yes  If so, are there any without handrails? No  Home free of loose throw rugs in walkways, pet beds, electrical cords, etc? Yes  Adequate lighting in your home to reduce risk of falls? Yes   ASSISTIVE DEVICES UTILIZED TO PREVENT FALLS:  Life alert? No  Use of a cane, walker or w/c? No  Grab bars in the bathroom? Yes  Shower chair or bench in shower? No  Elevated toilet seat or a handicapped toilet? No    Cognitive Function:  Normal cognitive status assessed by direct observation by this Nurse Health Advisor. No abnormalities found.        Immunizations Immunization History  Administered Date(s) Administered   Fluad Quad(high Dose 65+) 09/30/2019, 10/10/2020   Influenza,inj,Quad PF,6+ Mos 10/15/2017   Influenza,inj,quad, With Preservative 09/28/2018   Influenza-Unspecified 09/28/2012, 09/28/2013, 09/28/2017   PFIZER Comirnaty(Gray Top)Covid-19 Tri-Sucrose Vaccine 03/28/2021   PFIZER(Purple Top)SARS-COV-2 Vaccination 01/18/2020, 02/08/2020, 09/11/2020   Pneumococcal Conjugate-13 08/19/2018   Pneumococcal Polysaccharide-23 11/03/2019   Td 09/04/2009   Td,absorbed, Preservative Free, Adult Use, Lf Unspecified 03/28/2021   Zoster Recombinat (Shingrix) 01/20/2018, 03/22/2018   Zoster, Live 08/12/2013    TDAP status: Up to date  Flu Vaccine status: Up to date  Pneumococcal vaccine status: Up to date  Covid-19 vaccine status: Completed vaccines  Qualifies for Shingles Vaccine? Yes   Zostavax completed Yes   Shingrix Completed?: Yes  Screening Tests Health Maintenance  Topic Date Due   TETANUS/TDAP  09/05/2019   INFLUENZA VACCINE  07/29/2021   COLONOSCOPY (Pts 45-62yrs Insurance coverage will  need to be confirmed)  04/29/2025   Pneumonia Vaccine 82+ Years old  Completed   COVID-19 Vaccine  Completed   Hepatitis C Screening  Completed   Zoster Vaccines- Shingrix  Completed   HPV VACCINES  Aged Out    Health Maintenance  Health Maintenance Due  Topic Date Due   TETANUS/TDAP  09/05/2019   INFLUENZA VACCINE  07/29/2021    Colorectal cancer screening: Type of screening: Colonoscopy. Completed 04/30/2015. Repeat every 10 years  Lung Cancer Screening: (Low Dose CT Chest recommended if Age 80-80 years, 30 pack-year currently smoking OR have quit w/in 15years.) does not qualify.   Lung Cancer Screening Referral: n/a  Additional Screening:  Hepatitis C Screening: does not qualify; Completed 05/29/2015  Vision Screening: Recommended annual ophthalmology exams for early detection of glaucoma and other disorders of the eye. Is the patient up to date with their annual eye exam?  Yes  Who is the provider or what is the name of the office in which the patient attends annual eye exams? Dr.Gould  If pt is not established with a provider, would they like to be referred to a provider to establish care? No .   Dental Screening: Recommended annual dental exams for proper oral hygiene  Community Resource Referral / Chronic Care Management: CRR required this visit?  No   CCM required this visit?  No      Plan:     I have personally  reviewed and noted the following in the patients chart:   Medical and social history Use of alcohol, tobacco or illicit drugs  Current medications and supplements including opioid prescriptions. Patient is not currently taking opioid prescriptions. Functional ability and status Nutritional status Physical activity Advanced directives List of other physicians Hospitalizations, surgeries, and ER visits in previous 12 months Vitals Screenings to include cognitive, depression, and falls Referrals and appointments  In addition, I have reviewed and  discussed with patient certain preventive protocols, quality metrics, and best practice recommendations. A written personalized care plan for preventive services as well as general preventive health recommendations were provided to patient.     Randel Pigg, LPN   [id number]/[id number]   Nurse Notes: none

## 2022-02-20 ENCOUNTER — Other Ambulatory Visit (INDEPENDENT_AMBULATORY_CARE_PROVIDER_SITE_OTHER): Payer: Self-pay | Admitting: Adult Health

## 2022-02-20 DIAGNOSIS — R7303 Prediabetes: Secondary | ICD-10-CM

## 2022-02-20 NOTE — Telephone Encounter (Signed)
LAST APPOINTMENT DATE: 02/06/22 NEXT APPOINTMENT DATE: 03/10/22   [id number] - [place], [place] - [address] [place] [address] Phone: [phone] Fax: [phone]  Patient is requesting a refill of the following medications: Pending Prescriptions:                       Disp   Refills   metFORMIN (GLUCOPHAGE) 500 MG tablet [Phar*60 tab*1       Sig: TAKE 1 TABLET (500 MG TOTAL) BY MOUTH 2 (TWO) TIMES          DAILY WITH A MEAL. TAKE WITH BREAKFAST AND LUNCH   Date last filled: 01/28/22 Previously prescribed by [facility]  Lab Results      Component                Value               Date                      HGBA1C                   5.5                 01/09/2022                HGBA1C                   5.8 (H)             08/19/2021                HGBA1C                   5.3                 03/12/2021           Lab Results      Component                Value               Date                      LDLCALC                  61                  04/22/2021                CREATININE               0.84                01/09/2022           Lab Results      Component                Value               Date                      VD25OH                   42.5                01/09/2022  VD25OH                   67.0                04/22/2021                VD25OH                   32.6                01/01/2021            BP Readings from Last 3 Encounters: 02/06/22 : 117/68 01/09/22 : 121/74 12/09/21 : 111/64

## 2022-03-03 ENCOUNTER — Telehealth: Payer: Self-pay

## 2022-03-03 NOTE — Progress Notes (Signed)
? ? ?Chronic Care Management ?Pharmacy Assistant  ? ?Name: [name]  MRN: [id number] DOB: February 10, 1951 ? ?[name] is an 71 y.o. year old male who presents for his follow-up CCM visit with the clinical pharmacist. ? ?Reason for Encounter: Disease State ?  ?Conditions to be addressed/monitored: ?HTN ? ? ?Recent office visits:  ?None ID ? ?Recent consult visits:  ?02/06/22 Mina Marble D, NP-Family Medicine (Elevated LFTS) No orders or med changes ? ?01/09/22 Mina Marble D, NP-Family Medicine (Hypertension) Blood work ordered, no med changes ? ?12/09/21  Mina Marble D, NP-Family Medicine (Pre-diabetes) No orders or med changes ? ?11/07/21  Mina Marble D, NP-Family Medicine (Pre-diabetes) No orders or med changes;Semaglutide 0.5 mg ? ?Hospital visits:  ?None in previous 6 months ? ?Medications: ?Outpatient Encounter Medications as of 03/03/2022  ?Medication Sig  ? aspirin 81 MG EC tablet Take 81 mg by mouth daily. Swallow whole.  ? atorvastatin (LIPITOR) 80 MG tablet Take 1 tablet (80 mg total) by mouth daily at 6 PM.  ? clopidogrel (PLAVIX) 75 MG tablet Take 1 tablet (75 mg total) by mouth daily.  ? levothyroxine (SYNTHROID) 150 MCG tablet TAKE 1 TABLET BY MOUTH EVERY DAY BEFORE BREAKFAST  ? metFORMIN (GLUCOPHAGE) 500 MG tablet TAKE 1 TABLET (500 MG TOTAL) BY MOUTH 2 (TWO) TIMES DAILY WITH A MEAL. TAKE WITH BREAKFAST AND LUNCH  ? metoprolol succinate (TOPROL-XL) 25 MG 24 hr tablet Take 1 tablet (25 mg total) by mouth daily.  ? nitroGLYCERIN (NITROSTAT) 0.4 MG SL tablet PLACE 1 TABLET (0.4 MG TOTAL) UNDER THE TONGUE EVERY 5 (FIVE) MINUTES AS NEEDED FOR CHEST PAIN.  ? tamsulosin (FLOMAX) 0.4 MG CAPS capsule Take 0.4 mg by mouth daily.  ? telmisartan (MICARDIS) 80 MG tablet Take 1 tablet (80 mg total) by mouth daily.  ? Vitamin D, Ergocalciferol, (DRISDOL) 1.25 MG (50000 UNIT) CAPS capsule Take 1 capsule (50,000 Units total) by mouth every 7 (seven) days.  ? ?No facility-administered encounter medications on  file as of 03/03/2022.  ? ?Reviewed chart prior to disease state call. Spoke with patient regarding BP ? ?Recent Office Vitals: ?BP Readings from Last 3 Encounters:  ?02/06/22 117/68  ?01/09/22 121/74  ?12/09/21 111/64  ? ?Pulse Readings from Last 3 Encounters:  ?02/06/22 (!) 58  ?01/09/22 60  ?12/09/21 (!) 58  ?  ?Wt Readings from Last 3 Encounters:  ?02/06/22 243 lb (110.2 kg)  ?01/09/22 244 lb (110.7 kg)  ?12/09/21 245 lb (111.1 kg)  ?  ? ?Kidney Function ?Lab Results  ?Component Value Date/Time  ? CREATININE 0.84 01/09/2022 02:41 PM  ? CREATININE 0.85 08/19/2021 10:04 AM  ? GFR 92.31 12/11/2020 09:46 AM  ? GFRNONAA >60 04/16/2021 03:03 AM  ? GFRAA 102 01/01/2021 09:25 AM  ? ? ?BMP Latest Ref Rng & Units 01/09/2022 08/19/2021 04/22/2021  ?Glucose 70 - 99 mg/dL 89 110(H) 113(H)  ?BUN 8 - 27 mg/dL '15 11 16  '$ ?Creatinine 0.76 - 1.27 mg/dL 0.84 0.85 0.84  ?BUN/Creat Ratio 10 - '24 18 13 19  '$ ?Sodium 134 - 144 mmol/L 141 143 142  ?Potassium 3.5 - 5.2 mmol/L 4.5 4.7 4.3  ?Chloride 96 - 106 mmol/L 105 109(H) 106  ?CO2 20 - 29 mmol/L '22 22 21  '$ ?Calcium 8.6 - 10.2 mg/dL 9.3 9.2 9.3  ? ? ?Current antihypertensive regimen:  ?Telmisartan 80 mg ? ?How often are you checking your Blood Pressure?  Spoke with patient wife who states that patient does check blood pressure readings at  home ? ?Current home BP readings: Patient was not home so she does not have his reading but states that his reading have been good ? ?What recent interventions/DTPs have been made by any provider to improve Blood Pressure control since last CPP Visit: none noted ? ?Any recent hospitalizations or ED visits since last visit with CPP? No ? ?What diet changes have been made to improve Blood Pressure Control?  ?Patient is starting to eat healthier ? ?What exercise is being done to improve your Blood Pressure Control?  ?She states that patient has now joined a weight loss center and is exercising and they keep a check on his blood pressure ? ?Adherence Review: ?Is  the patient currently on ACE/ARB medication? Yes ?Does the patient have >5 day gap between last estimated fill dates? No ? ? ?Care Gaps: ?Colonoscopy-04/30/15 ?Diabetic Foot Exam-NA ?Ophthalmology-NA ?Dexa Scan - NA ?Annual Well Visit - NA ?Micro albumin-NA ?Hemoglobin A1c-01/09/22 ? ?Star Rating Drugs: ?Metformin 500 mg-last fill 01/26/22 30 ds ?Telmisartan 80 mg-last fill 12/09/21 90 ds ?Atorvastatin 80 mg-last fill 02/10/22 90 ds ?Ethelene Hal ?Clinical Pharmacist Assistant ?[phone]  ?

## 2022-03-10 ENCOUNTER — Other Ambulatory Visit: Payer: Self-pay

## 2022-03-10 ENCOUNTER — Ambulatory Visit (INDEPENDENT_AMBULATORY_CARE_PROVIDER_SITE_OTHER): Payer: Medicare Other | Admitting: Adult Health

## 2022-03-10 ENCOUNTER — Encounter (INDEPENDENT_AMBULATORY_CARE_PROVIDER_SITE_OTHER): Payer: Self-pay | Admitting: Adult Health

## 2022-03-10 VITALS — BP 124/71 | HR 58 | Temp 97.8°F | Ht 71.0 in | Wt 245.0 lb

## 2022-03-10 DIAGNOSIS — E669 Obesity, unspecified: Secondary | ICD-10-CM | POA: Diagnosis not present

## 2022-03-10 DIAGNOSIS — Z6834 Body mass index (BMI) 34.0-34.9, adult: Secondary | ICD-10-CM | POA: Diagnosis not present

## 2022-03-10 DIAGNOSIS — E66812 Obesity, class 2: Secondary | ICD-10-CM

## 2022-03-10 DIAGNOSIS — E559 Vitamin D deficiency, unspecified: Secondary | ICD-10-CM | POA: Diagnosis not present

## 2022-03-10 DIAGNOSIS — Z6837 Body mass index (BMI) 37.0-37.9, adult: Secondary | ICD-10-CM

## 2022-03-10 DIAGNOSIS — R7303 Prediabetes: Secondary | ICD-10-CM

## 2022-03-10 MED ORDER — VITAMIN D (ERGOCALCIFEROL) 1.25 MG (50000 UNIT) PO CAPS: 50000.0000 [IU] | ORAL_CAPSULE | ORAL | 0 refills | Status: DC

## 2022-03-10 NOTE — Progress Notes (Signed)
? ? ? ?Chief Complaint:  ? ?OBESITY ?Katrell is here to discuss his progress with his obesity treatment plan along with follow-up of his obesity related diagnoses. Aryeh is on the Category 3 Plan and states he is following his eating plan approximately 70% of the time. Kevyn states he is swimming for 60 minutes 5 times per week. ? ?Today's visit was #: 18 ?Starting weight: 266 lbs ?Starting date: 01/01/2021 ?Today's weight: 245 lbs ?Today's date: 03/10/2022 ?Total lbs lost to date: 21 lbs ?Total lbs lost since last in-office visit: 0 ? ?Interim History:  ?Saheed says he has been very consistent the last several weeks, then deviated from the eating plan over this last weekend via the following events: ?Family celebrated his daughter's birthday ?Martin Majestic out with friends on Friday ?Watching the  [employer] tournament - ? ?Reviewed bioimpedance with patient: ?Muscle mass 1.6+ ?Adipose mass 0.4+ ?Water weight 0.6+ ? ?Subjective:  ? ?1. Vitamin D deficiency ?On 01/09/2022, vitamin D level - 42.5 - below goal of 50-70. ?He is currently taking prescription ergocalciferol 50,000 IU each week. He denies nausea, vomiting or muscle weakness. ? ?2. Pre-diabetes ?Metformin 500 mg BID with meals - tolerating. ? ?Assessment/Plan:  ? ?1. Vitamin D deficiency ?Check labs next month. ?Refill ergocalciferol 50,000 IU once weekly. ? ?- Refill Vitamin D, Ergocalciferol, (DRISDOL) 1.25 MG (50000 UNIT) CAPS capsule; Take 1 capsule (50,000 Units total) by mouth every 7 (seven) days.  Dispense: 4 capsule; Refill: 0 ? ?2. Pre-diabetes ?Continue metformin as directed. ?Check labs next month ? ?3. Obesity with current BMI 34.2 ? ?Zaeden is currently in the action stage of change. As such, his goal is to continue with weight loss efforts. He has agreed to the Category 3 Plan.  ? ?Exercise goals:  As is. ? ?Behavioral modification strategies: increasing lean protein intake, decreasing simple carbohydrates, meal planning and cooking strategies, keeping  healthy foods in the home, better snacking choices, and planning for success. ? ?Winter has agreed to follow-up with our clinic in 4 weeks. He was informed of the importance of frequent follow-up visits to maximize his success with intensive lifestyle modifications for his multiple health conditions.  ? ?Objective:  ? ?Blood pressure 124/71, pulse (!) 58, temperature 97.8 ?F (36.6 ?C), height '5\' 11"'$  (1.803 m), weight 245 lb (111.1 kg), SpO2 96 %. ?Body mass index is 34.17 kg/m?. ? ?General: Cooperative, alert, well developed, in no acute distress. ?HEENT: Conjunctivae and lids unremarkable. ?Cardiovascular: Regular rhythm.  ?Lungs: Normal work of breathing. ?Neurologic: No focal deficits.  ? ?Lab Results  ?Component Value Date  ? CREATININE 0.84 01/09/2022  ? BUN 15 01/09/2022  ? NA 141 01/09/2022  ? K 4.5 01/09/2022  ? CL 105 01/09/2022  ? CO2 22 01/09/2022  ? ?Lab Results  ?Component Value Date  ? ALT 41 01/09/2022  ? AST 34 01/09/2022  ? ALKPHOS 87 01/09/2022  ? BILITOT 0.8 01/09/2022  ? ?Lab Results  ?Component Value Date  ? HGBA1C 5.5 01/09/2022  ? HGBA1C 5.8 (H) 08/19/2021  ? HGBA1C 5.3 03/12/2021  ? HGBA1C 6.0 12/11/2020  ? HGBA1C 5.6 12/29/2019  ? ?Lab Results  ?Component Value Date  ? INSULIN 8.4 01/09/2022  ? INSULIN 22.7 08/19/2021  ? INSULIN 19.1 04/22/2021  ? INSULIN 34.7 (H) 01/01/2021  ? ?Lab Results  ?Component Value Date  ? TSH 0.581 04/22/2021  ? ?Lab Results  ?Component Value Date  ? CHOL 117 04/22/2021  ? HDL 41 04/22/2021  ? [place] 61 04/22/2021  ?  LDLDIRECT 153.1 06/09/2011  ? TRIG 72 04/22/2021  ? CHOLHDL 3 12/11/2020  ? ?Lab Results  ?Component Value Date  ? VD25OH 42.5 01/09/2022  ? VD25OH 67.0 04/22/2021  ? VD25OH 32.6 01/01/2021  ? ?Lab Results  ?Component Value Date  ? WBC 7.0 04/16/2021  ? HGB 13.8 04/16/2021  ? HCT 40.4 04/16/2021  ? MCV 90.4 04/16/2021  ? PLT 124 (L) 04/16/2021  ? ?Obesity Behavioral Intervention:  ? ?Approximately 15 minutes were spent on the discussion  below. ? ?ASK: ?We discussed the diagnosis of obesity with [name] today and Amahri agreed to give [place] permission to discuss obesity behavioral modification therapy today. ? ?ASSESS: ?Prosper has the diagnosis of obesity and his BMI today is 34.2. Kristapher is in the action stage of change.  ? ?ADVISE: ?Garnie was educated on the multiple health risks of obesity as well as the benefit of weight loss to improve his health. He was advised of the need for long term treatment and the importance of lifestyle modifications to improve his current health and to decrease his risk of future health problems. ? ?AGREE: ?Multiple dietary modification options and treatment options were discussed and Denman agreed to follow the recommendations documented in the above note. ? ?ARRANGE: ?Grantley was educated on the importance of frequent visits to treat obesity as outlined per CMS and USPSTF guidelines and agreed to schedule his next follow up appointment today. ? ?Attestation Statements:  ? ?Reviewed by clinician on day of visit: allergies, medications, problem list, medical history, surgical history, family history, social history, and previous encounter notes. ? ?I, Water quality scientist, CMA, am acting as Location manager for Mina Marble, NP. ? ?I have reviewed the above documentation for accuracy and completeness, and I agree with the above. - Rolly Magri d. Mekel Haverstock, NP-C ?

## 2022-03-13 DIAGNOSIS — R972 Elevated prostate specific antigen [PSA]: Secondary | ICD-10-CM | POA: Diagnosis not present

## 2022-03-13 LAB — PSA: PSA: 10

## 2022-03-15 IMAGING — CR DG HIP (WITH OR WITHOUT PELVIS) 2-3V*R*
3 series · 3 of 3 positions shown · non-contrast
Comparison: Plain films right hip 01/29/2011.

CLINICAL DATA: Onset right hip pain when the patient felt a pop in
the hip when he was getting out of his car this morning. Initial
encounter.

EXAM:
DG HIP (WITH OR WITHOUT PELVIS) 2-3V RIGHT

[x pelvis (1 of 2)]
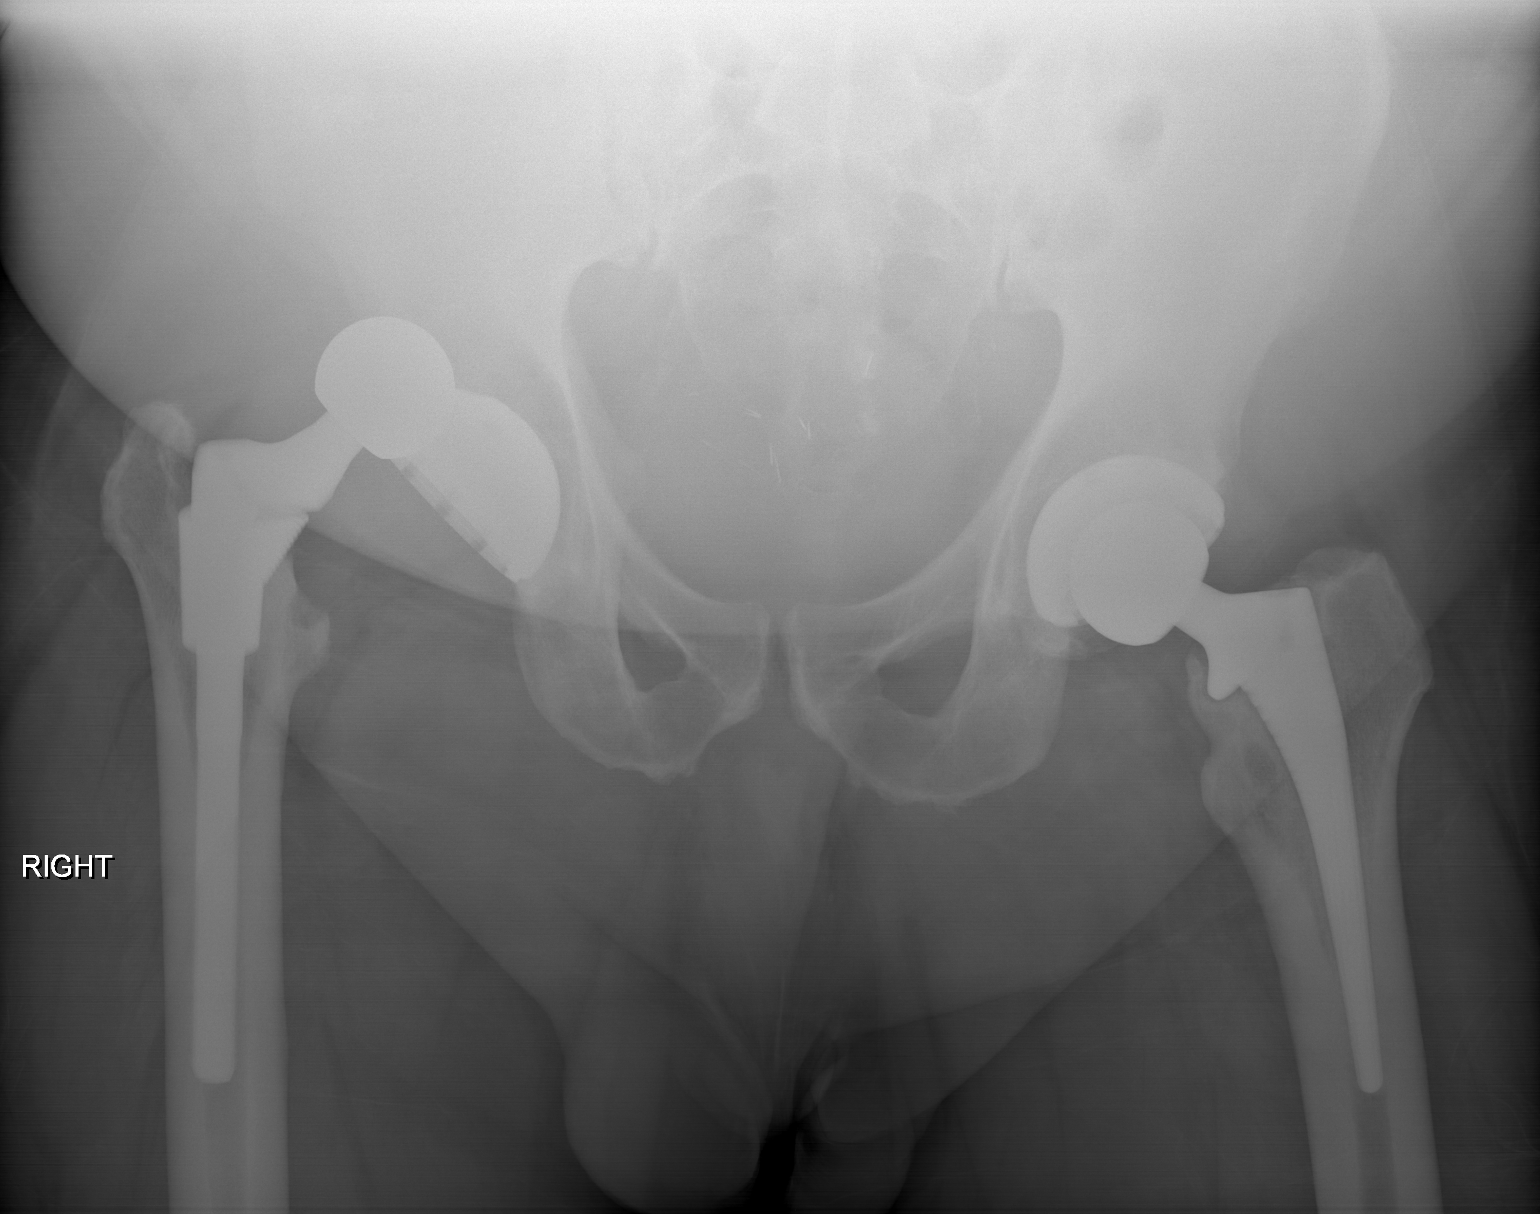

[x pelvis (2 of 2)]
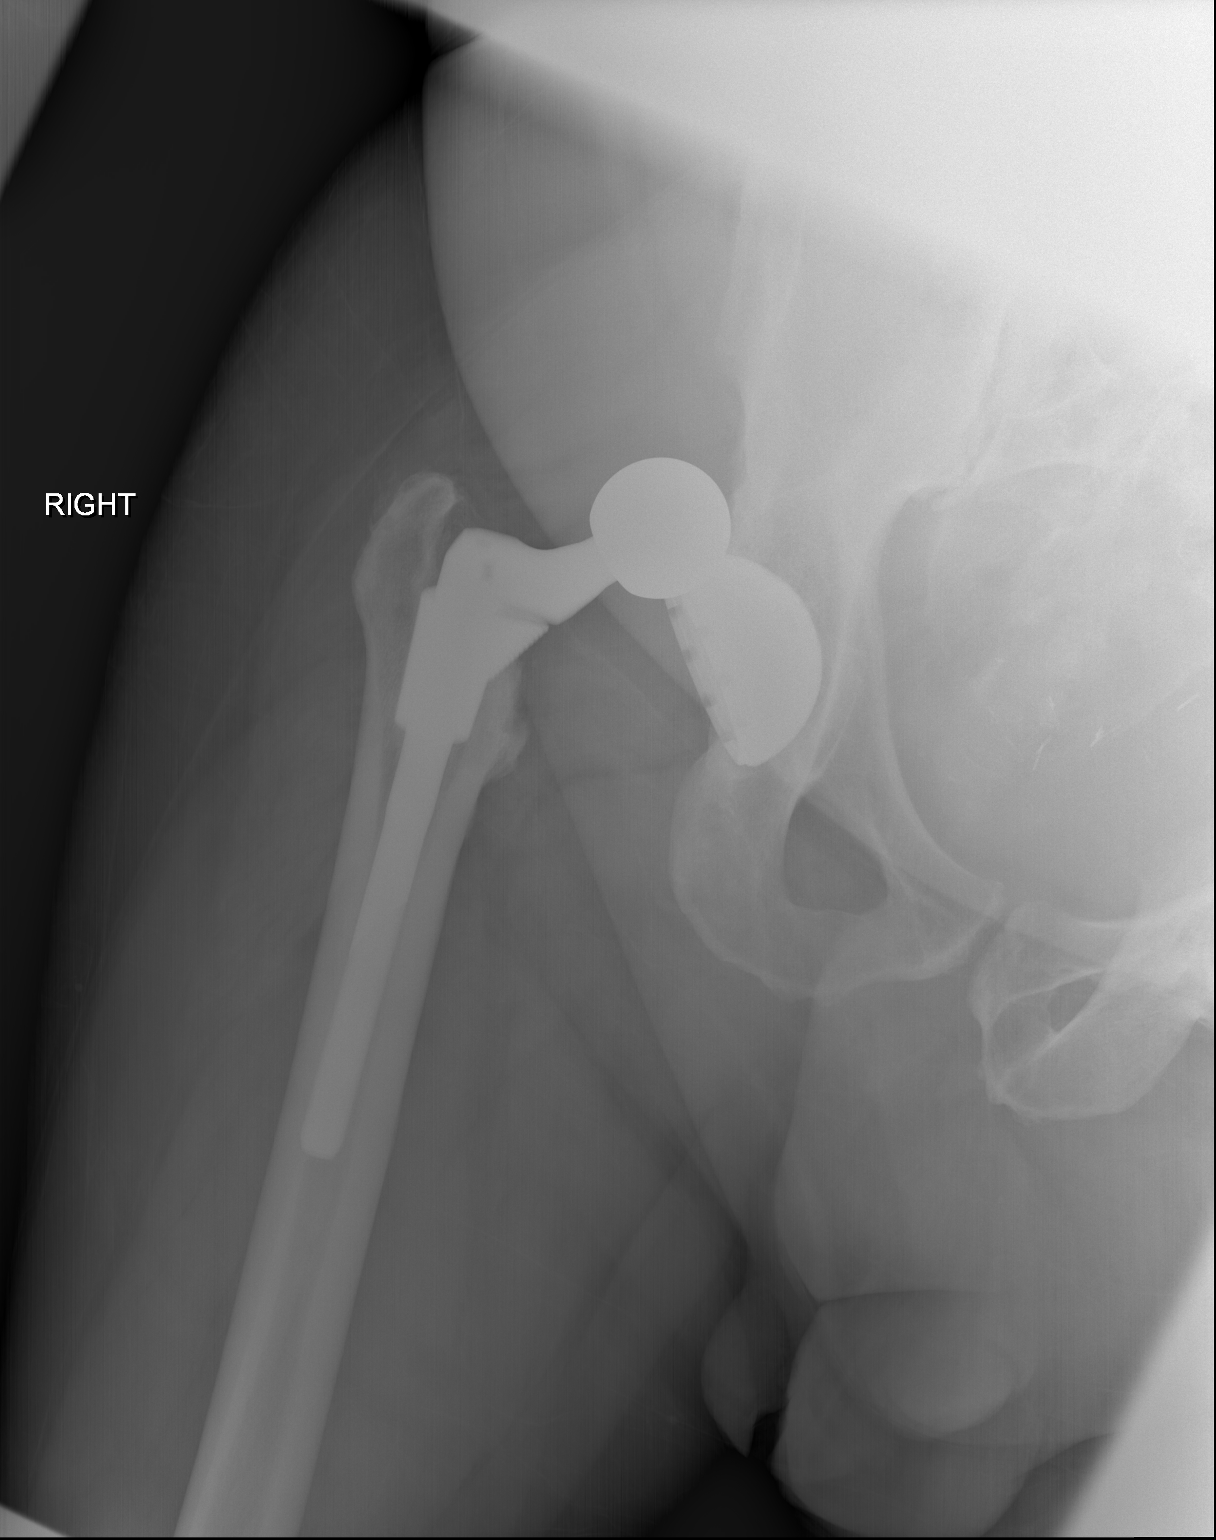

[w hip lat right]
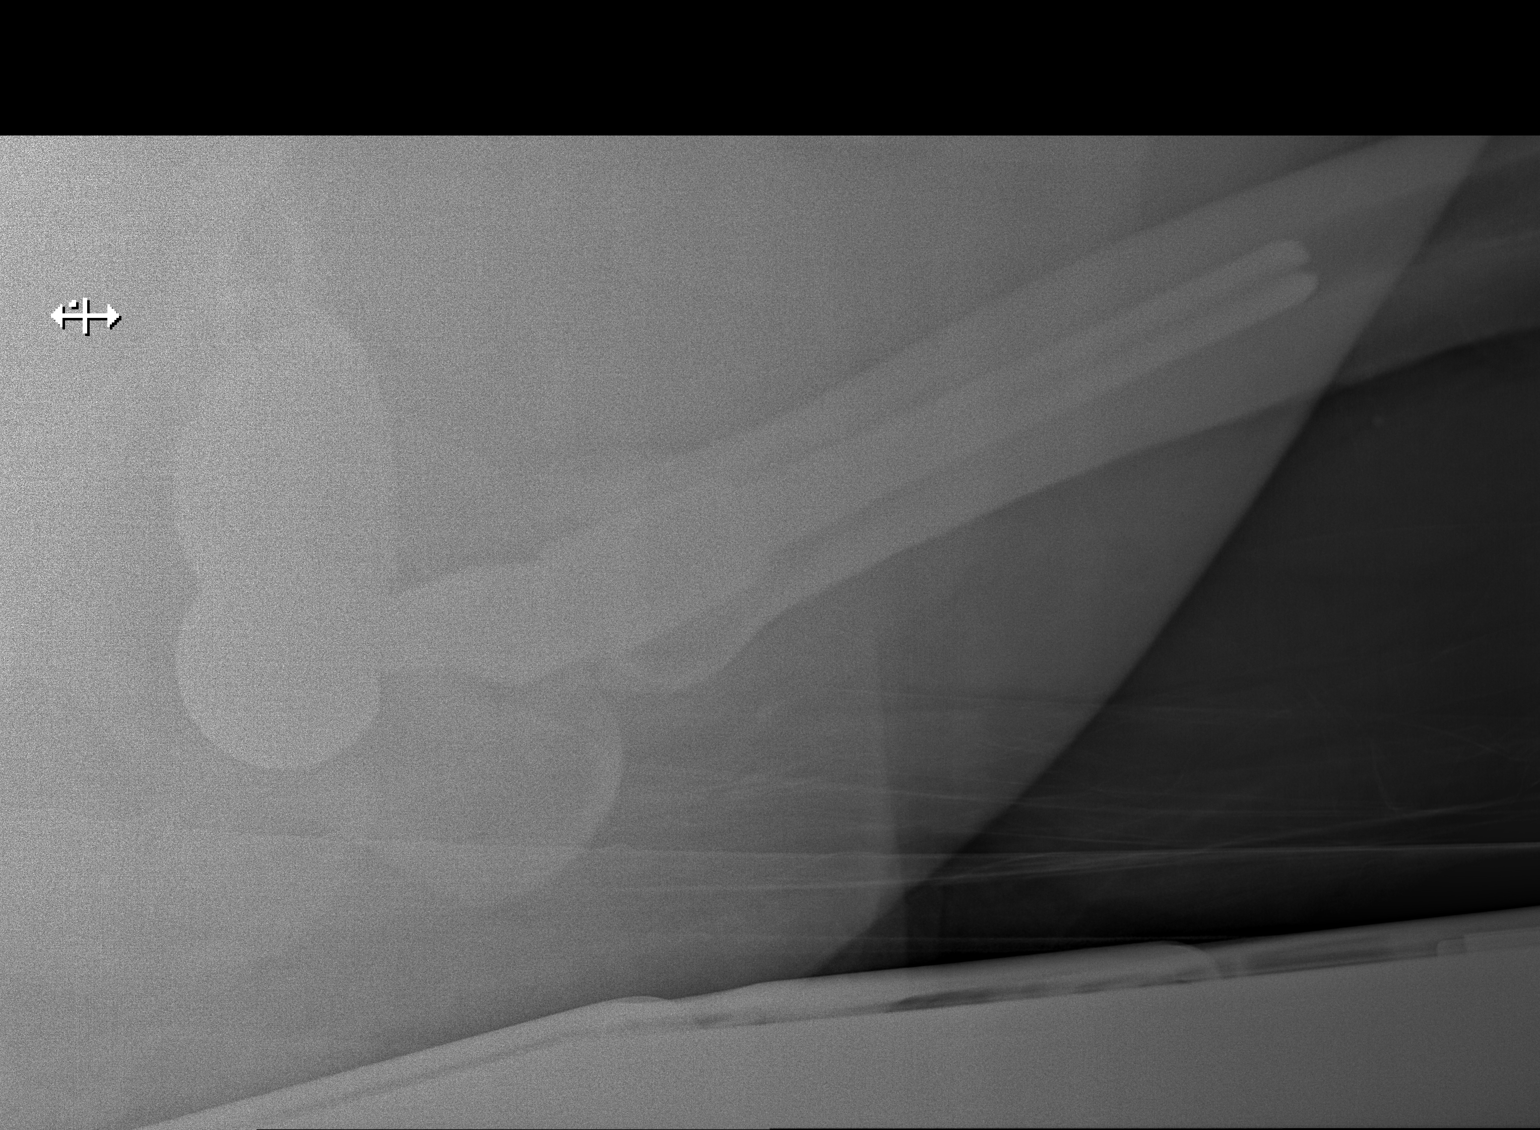

[3 of 3 positions shown; findings below may reference images not displayed]

FINDINGS: The patient has a right total hip arthroplasty. The device is
posterosuperiorly dislocated. No fracture.
IMPRESSION: Posterosuperior dislocation of a right hip arthroplasty.

## 2022-03-16 ENCOUNTER — Other Ambulatory Visit (INDEPENDENT_AMBULATORY_CARE_PROVIDER_SITE_OTHER): Payer: Self-pay | Admitting: Adult Health

## 2022-03-16 DIAGNOSIS — R7303 Prediabetes: Secondary | ICD-10-CM

## 2022-03-20 DIAGNOSIS — R351 Nocturia: Secondary | ICD-10-CM | POA: Diagnosis not present

## 2022-03-20 DIAGNOSIS — N401 Enlarged prostate with lower urinary tract symptoms: Secondary | ICD-10-CM | POA: Diagnosis not present

## 2022-03-20 DIAGNOSIS — R972 Elevated prostate specific antigen [PSA]: Secondary | ICD-10-CM | POA: Diagnosis not present

## 2022-04-04 ENCOUNTER — Other Ambulatory Visit (INDEPENDENT_AMBULATORY_CARE_PROVIDER_SITE_OTHER): Payer: Self-pay | Admitting: Adult Health

## 2022-04-04 DIAGNOSIS — E559 Vitamin D deficiency, unspecified: Secondary | ICD-10-CM

## 2022-04-11 DIAGNOSIS — Z96642 Presence of left artificial hip joint: Secondary | ICD-10-CM | POA: Diagnosis not present

## 2022-04-11 DIAGNOSIS — Z96643 Presence of artificial hip joint, bilateral: Secondary | ICD-10-CM | POA: Diagnosis not present

## 2022-04-11 DIAGNOSIS — Z96641 Presence of right artificial hip joint: Secondary | ICD-10-CM | POA: Diagnosis not present

## 2022-04-17 ENCOUNTER — Ambulatory Visit (INDEPENDENT_AMBULATORY_CARE_PROVIDER_SITE_OTHER): Payer: Medicare Other | Admitting: Adult Health

## 2022-04-22 DIAGNOSIS — M25552 Pain in left hip: Secondary | ICD-10-CM | POA: Diagnosis not present

## 2022-04-22 DIAGNOSIS — M5416 Radiculopathy, lumbar region: Secondary | ICD-10-CM | POA: Diagnosis not present

## 2022-04-30 DIAGNOSIS — M25552 Pain in left hip: Secondary | ICD-10-CM | POA: Diagnosis not present

## 2022-04-30 DIAGNOSIS — M5416 Radiculopathy, lumbar region: Secondary | ICD-10-CM | POA: Diagnosis not present

## 2022-05-03 ENCOUNTER — Other Ambulatory Visit: Payer: Self-pay | Admitting: Cardiology

## 2022-05-07 ENCOUNTER — Ambulatory Visit (INDEPENDENT_AMBULATORY_CARE_PROVIDER_SITE_OTHER): Payer: Medicare Other | Admitting: Adult Health

## 2022-05-07 ENCOUNTER — Encounter (INDEPENDENT_AMBULATORY_CARE_PROVIDER_SITE_OTHER): Payer: Self-pay | Admitting: Adult Health

## 2022-05-07 VITALS — BP 109/68 | HR 70 | Temp 97.6°F | Ht 71.0 in | Wt 241.0 lb

## 2022-05-07 DIAGNOSIS — Z6833 Body mass index (BMI) 33.0-33.9, adult: Secondary | ICD-10-CM | POA: Diagnosis not present

## 2022-05-07 DIAGNOSIS — E669 Obesity, unspecified: Secondary | ICD-10-CM

## 2022-05-07 DIAGNOSIS — E559 Vitamin D deficiency, unspecified: Secondary | ICD-10-CM | POA: Diagnosis not present

## 2022-05-07 DIAGNOSIS — Z6837 Body mass index (BMI) 37.0-37.9, adult: Secondary | ICD-10-CM

## 2022-05-07 DIAGNOSIS — R7301 Impaired fasting glucose: Secondary | ICD-10-CM

## 2022-05-07 MED ORDER — VITAMIN D (ERGOCALCIFEROL) 1.25 MG (50000 UNIT) PO CAPS: 50000.0000 [IU] | ORAL_CAPSULE | ORAL | 0 refills | Status: DC

## 2022-05-07 MED ORDER — OZEMPIC (0.25 OR 0.5 MG/DOSE) 2 MG/1.5ML ~~LOC~~ SOPN: 0.2500 mg | PEN_INJECTOR | SUBCUTANEOUS | Status: DC

## 2022-05-08 DIAGNOSIS — M25552 Pain in left hip: Secondary | ICD-10-CM | POA: Diagnosis not present

## 2022-05-08 DIAGNOSIS — M5416 Radiculopathy, lumbar region: Secondary | ICD-10-CM | POA: Diagnosis not present

## 2022-05-09 ENCOUNTER — Other Ambulatory Visit (INDEPENDENT_AMBULATORY_CARE_PROVIDER_SITE_OTHER): Payer: Self-pay | Admitting: Adult Health

## 2022-05-09 DIAGNOSIS — E559 Vitamin D deficiency, unspecified: Secondary | ICD-10-CM

## 2022-05-12 NOTE — Progress Notes (Signed)
? ? ? ?Chief Complaint:  ? ?OBESITY ?[name] is here to discuss his progress with his obesity treatment plan along with follow-up of his obesity related diagnoses. [name] is on the Category 3 Plan and states he is following his eating plan approximately 70% of the time. [name] states he is swimming and doing yard work 60 minutes 5 times per week. ? ?Today's visit was #: 19 ?Starting weight: 266 lbs ?Starting date: 01/01/2021 ?Today's weight: 241 lbs ?Today's date: 05/07/2022 ?Total lbs lost to date: 25 ?Total lbs lost since last in-office visit: 4 ? ?Interim History:  ?[name] enjoyed celebrating Easter with candy.  ?He estimates to sleep 6-7 hours a night. ? ?Subjective:  ? ?1. Vitamin D deficiency ?On 01/09/22 [name]'s Vit D level at 42.5- stable, yet still below goal of 50-70. ?He is on Ergocalciferol- denies N/V/muscle weakness. ? ?2. IFG (impaired fasting glucose) ?[name] is currently on [name] 500 mg twice a day for breakfast and lunch.  ?He feels that biguanide therapy is not effective.  ?[name] denies family history of [name] or [name]. ?He denies personal history of pancreatitic. ? ?Assessment/Plan:  ? ?1. Vitamin D deficiency ?We will refill ergocalciferol 50,000 IU once week for 1 month with no refills. ? ?-Refill Vitamin D, Ergocalciferol, (DRISDOL) 1.25 MG (50000 UNIT) CAPS capsule; Take 1 capsule (50,000 Units total) by mouth every 7 (seven) days.  Dispense: 4 capsule; Refill: 0 ? ?2. IFG (impaired fasting glucose) ?[name] will start [name] 0.25 mg once a week. Once he starts [name] he will stop [name]. ? ?-Start Semaglutide,0.25 or 0.'5MG'$ /DOS, ([name], 0.25 OR 0.5 MG/DOSE,) 2 MG/1.5ML SOPN; Inject 0.25 mg into the skin once a week.  Dispense: 2 mL; Refill: ML ? ?3. Obesity with current BMI 33.6 ?We will check fasting labs at next office visit. ?[name] is currently in the action stage of change. As such, his goal is to continue with weight loss efforts. He has agreed to the Category 3 Plan.   ? ?Exercise goals: As is. ? ?Behavioral modification strategies: increasing lean protein intake, decreasing simple carbohydrates, meal planning and cooking strategies, keeping healthy foods in the home, and planning for success. ? ?[name] has agreed to follow-up with our clinic in 4 weeks. He was informed of the importance of frequent follow-up visits to maximize his success with intensive lifestyle modifications for his multiple health conditions.  ? ?Objective:  ? ?Blood pressure 109/68, pulse 70, temperature 97.6 ?F (36.4 ?C), height '5\' 11"'$  (1.803 m), weight 241 lb (109.3 kg), SpO2 97 %. ?Body mass index is 33.61 kg/m?. ? ?General: Cooperative, alert, well developed, in no acute distress. ?HEENT: Conjunctivae and lids unremarkable. ?Cardiovascular: Regular rhythm.  ?Lungs: Normal work of breathing. ?Neurologic: No focal deficits.  ? ?Lab Results  ?Component Value Date  ? CREATININE 0.84 01/09/2022  ? BUN 15 01/09/2022  ? NA 141 01/09/2022  ? K 4.5 01/09/2022  ? CL 105 01/09/2022  ? CO2 22 01/09/2022  ? ?Lab Results  ?Component Value Date  ? ALT 41 01/09/2022  ? AST 34 01/09/2022  ? ALKPHOS 87 01/09/2022  ? BILITOT 0.8 01/09/2022  ? ?Lab Results  ?Component Value Date  ? HGBA1C 5.5 01/09/2022  ? HGBA1C 5.8 (H) 08/19/2021  ? HGBA1C 5.3 03/12/2021  ? HGBA1C 6.0 12/11/2020  ? HGBA1C 5.6 12/29/2019  ? ?Lab Results  ?Component Value Date  ? INSULIN 8.4 01/09/2022  ? INSULIN 22.7 08/19/2021  ? INSULIN 19.1 04/22/2021  ? INSULIN 34.7 (H) 01/01/2021  ? ?Lab Results  ?  Component Value Date  ? TSH 0.581 04/22/2021  ? ?Lab Results  ?Component Value Date  ? CHOL 117 04/22/2021  ? HDL 41 04/22/2021  ? [place] 61 04/22/2021  ? LDLDIRECT 153.1 06/09/2011  ? TRIG 72 04/22/2021  ? CHOLHDL 3 12/11/2020  ? ?Lab Results  ?Component Value Date  ? VD25OH 42.5 01/09/2022  ? VD25OH 67.0 04/22/2021  ? VD25OH 32.6 01/01/2021  ? ?Lab Results  ?Component Value Date  ? WBC 7.0 04/16/2021  ? HGB 13.8 04/16/2021  ? HCT 40.4 04/16/2021  ? MCV  90.4 04/16/2021  ? PLT 124 (L) 04/16/2021  ? ?No results found for: IRON, TIBC, FERRITIN ? ?Obesity Behavioral Intervention:  ? ?Approximately 15 minutes were spent on the discussion below. ? ?ASK: ?We discussed the diagnosis of obesity with [name] today and [name] agreed to give [place] permission to discuss obesity behavioral modification therapy today. ? ?ASSESS: ?[name] has the diagnosis of obesity and his BMI today is 33.6. [name] is in the action stage of change.  ? ?ADVISE: ?[name] was educated on the multiple health risks of obesity as well as the benefit of weight loss to improve his health. He was advised of the need for long term treatment and the importance of lifestyle modifications to improve his current health and to decrease his risk of future health problems. ? ?AGREE: ?Multiple dietary modification options and treatment options were discussed and [name] agreed to follow the recommendations documented in the above note. ? ?ARRANGE: ?[name] was educated on the importance of frequent visits to treat obesity as outlined per CMS and USPSTF guidelines and agreed to schedule his next follow up appointment today. ? ?Attestation Statements:  ? ?Reviewed by clinician on day of visit: allergies, medications, problem list, medical history, surgical history, family history, social history, and previous encounter notes. ? ?I, [name], RMA, am acting as transcriptionist for [name] Marble, NP. ? ?I have reviewed the above documentation for accuracy and completeness, and I agree with the above. -  [name] d. [name] Lemay, NP-C ?

## 2022-05-17 ENCOUNTER — Other Ambulatory Visit: Payer: Self-pay | Admitting: Cardiology

## 2022-05-27 ENCOUNTER — Encounter: Payer: Self-pay | Admitting: Cardiology

## 2022-05-27 ENCOUNTER — Ambulatory Visit (INDEPENDENT_AMBULATORY_CARE_PROVIDER_SITE_OTHER): Payer: Medicare Other

## 2022-05-27 ENCOUNTER — Ambulatory Visit (INDEPENDENT_AMBULATORY_CARE_PROVIDER_SITE_OTHER): Payer: Medicare Other | Admitting: Cardiology

## 2022-05-27 VITALS — BP 132/70 | HR 51 | Ht 71.0 in | Wt 255.6 lb

## 2022-05-27 DIAGNOSIS — I2510 Atherosclerotic heart disease of native coronary artery without angina pectoris: Secondary | ICD-10-CM

## 2022-05-27 DIAGNOSIS — G4733 Obstructive sleep apnea (adult) (pediatric): Secondary | ICD-10-CM

## 2022-05-27 DIAGNOSIS — I10 Essential (primary) hypertension: Secondary | ICD-10-CM | POA: Diagnosis not present

## 2022-05-27 DIAGNOSIS — Z9861 Coronary angioplasty status: Secondary | ICD-10-CM | POA: Diagnosis not present

## 2022-05-27 DIAGNOSIS — E7800 Pure hypercholesterolemia, unspecified: Secondary | ICD-10-CM

## 2022-05-27 DIAGNOSIS — E782 Mixed hyperlipidemia: Secondary | ICD-10-CM

## 2022-05-27 LAB — LIPID PANEL
Chol/HDL Ratio: 2.8 ratio (ref 0.0–5.0)
Cholesterol, Total: 117 mg/dL (ref 100–199)
HDL: 42 mg/dL (ref >39)
LDL Chol Calc (NIH): 62 mg/dL (ref 0–99)
Triglycerides: 59 mg/dL (ref 0–149)
VLDL Cholesterol Cal: 13 mg/dL (ref 5–40)

## 2022-05-27 LAB — ALT: ALT: 47 IU/L — ABNORMAL HIGH (ref 0–44)

## 2022-05-27 NOTE — Patient Instructions (Signed)
Medication Instructions:  Your physician recommends that you continue on your current medications as directed. Please refer to the Current Medication list given to you today.  *If you need a refill on your cardiac medications before your next appointment, please call your pharmacy*   Lab Work: TODAY: Fasting Lipids and ALT If you have labs (blood work) drawn today and your tests are completely normal, you will receive your results only by: [place] (if you have MyChart) OR A paper copy in the mail If you have any lab test that is abnormal or we need to change your treatment, we will call you to review the results.   Follow-Up: At [facility], you and your health needs are our priority.  As part of our continuing mission to provide you with exceptional heart care, we have created designated Provider Care Teams.  These Care Teams include your primary Cardiologist (physician) and Advanced Practice Providers (APPs -  Physician Assistants and Nurse Practitioners) who all work together to provide you with the care you need, when you need it.  Your next appointment:   1 year(s)  The format for your next appointment:   In Person  Provider:   Fransico Him, MD    Important Information About Sugar

## 2022-05-27 NOTE — Progress Notes (Signed)
Chronic Care Management Pharmacy Note  05/27/2022 Name:  [name] MRN:  [id number] DOB:  [birth year]  Summary: -Pt reports that he was unable to start ozempic - reports that cost was >$500 / month - continues on metformin at this time  -Endorses compliance with his current medications, denies any other issues or concerns with his medications  -BP controlled in office, reports that when he is checking at home, has been <130/80  Recommendations/Changes made from today's visit: -Recommending no changes to medications, explained to patient about PAP for ozempic which could be an option if he would want to start, plans to discuss with weight and wellness office at next appointment   Plan: -F/u in 6 months   Subjective: [name] is an 71 y.o. year old male who is a primary patient of Burns, Claudina Lick, MD.  The CCM team was consulted for assistance with disease management and care coordination needs.    Engaged with patient by telephone for follow up visit in response to provider referral for pharmacy case management and/or care coordination services.   Consent to Services:  The patient was given the following information about Chronic Care Management services today, agreed to services, and gave verbal consent: 1. CCM service includes personalized support from designated clinical staff supervised by the primary care provider, including individualized plan of care and coordination with other care providers 2. 24/7 contact phone numbers for assistance for urgent and routine care needs. 3. Service will only be billed when office clinical staff spend 20 minutes or more in a month to coordinate care. 4. Only one practitioner may furnish and bill the service in a calendar month. 5.The patient may stop CCM services at any time (effective at the end of the month) by phone call to the office staff. 6. The patient will be responsible for cost sharing (co-pay) of up to 20% of the service fee (after  annual deductible is met). Patient agreed to services and consent obtained.  Patient Care Team: Binnie Rail, MD as PCP - General (Internal Medicine) Sueanne Margarita, MD as PCP - Cardiology (Cardiology) Gaynelle Arabian, MD as Consulting Physician (Orthopedic Surgery) Carolan Clines, MD (Inactive) (Urology) Pyrtle, Lajuan Lines, MD as Consulting Physician (Gastroenterology) Charlton Haws, [facility] as Pharmacist (Pharmacist) Melissa Noon, OD as Referring Physician (Optometry)  Recent office visits: 06/11/2021 - Dr. Quay Burow - no changes to medications - f/u in 1 year   Recent consult visits: 05/27/2022 - Dr. Radford Pax - cardiology - no changes to medications  05/07/2022 - Mina Marble NP - Weight management - start ozempic 0.81m weekly - stop metformin once started - f/u in 4 weeks  03/10/2022 - KMina MarbleNP - no changes to medications, f/u in 4 weeks  02/06/2022 - KMina MarbleNP - weight management - no changes to medications - f/u in 4 weeks   Hospital visits: None in previous 6 months  Objective:  Lab Results  Component Value Date   CREATININE 0.84 01/09/2022   BUN 15 01/09/2022   GFR 92.31 12/11/2020   GFRNONAA >60 04/16/2021   GFRAA 102 01/01/2021   NA 141 01/09/2022   K 4.5 01/09/2022   CALCIUM 9.3 01/09/2022   CO2 22 01/09/2022   GLUCOSE 89 01/09/2022    Lab Results  Component Value Date/Time   HGBA1C 5.5 01/09/2022 02:41 PM   HGBA1C 5.8 (H) 08/19/2021 10:04 AM   GFR 92.31 12/11/2020 09:46 AM   GFR 99.48 11/03/2019 08:15 AM  Last diabetic Eye exam:  No results found for: HMDIABEYEEXA  Last diabetic Foot exam:  No results found for: HMDIABFOOTEX   Lab Results  Component Value Date   CHOL 117 04/22/2021   HDL 41 04/22/2021   LDLCALC 61 04/22/2021   LDLDIRECT 153.1 06/09/2011   TRIG 72 04/22/2021   CHOLHDL 3 12/11/2020       Latest Ref Rng & Units 01/09/2022    2:41 PM 08/19/2021   10:04 AM 04/22/2021    8:38 AM  Hepatic Function  Total Protein 6.0 - 8.5  g/dL 7.1   7.1   7.2    Albumin 3.8 - 4.8 g/dL 4.5   4.4   4.0    AST 0 - 40 IU/L 34   69   31    ALT 0 - 44 IU/L 41   105   34    Alk Phosphatase 44 - 121 IU/L 87   89   99    Total Bilirubin 0.0 - 1.2 mg/dL 0.8   0.7   1.1      Lab Results  Component Value Date/Time   TSH 0.581 04/22/2021 08:38 AM   TSH 2.57 12/11/2020 09:46 AM   FREET4 1.54 04/22/2021 08:38 AM   FREET4 0.88 08/19/2018 08:34 AM       Latest Ref Rng & Units 04/16/2021    3:03 AM 04/05/2021    8:18 AM 03/13/2021    5:17 AM  CBC  WBC 4.0 - 10.5 K/uL 7.0   4.2   5.3    Hemoglobin 13.0 - 17.0 g/dL 13.8   15.3   13.4    Hematocrit 39.0 - 52.0 % 40.4   44.7   39.3    Platelets 150 - 400 K/uL 124   135   126      Lab Results  Component Value Date/Time   VD25OH 42.5 01/09/2022 02:41 PM   VD25OH 67.0 04/22/2021 08:38 AM    Clinical ASCVD: Yes  The ASCVD Risk score (Arnett DK, et al., 2019) failed to calculate for the following reasons:   The patient has a prior MI or stroke diagnosis       02/17/2022    8:22 AM 02/17/2022    8:19 AM 01/29/2021    1:41 PM  Depression screen PHQ 2/9  Decreased Interest 0 0 0  Down, Depressed, Hopeless 0 0 0  PHQ - 2 Score 0 0 0     Social History   Tobacco Use  Smoking Status Never  Smokeless Tobacco Never   BP Readings from Last 3 Encounters:  05/27/22 132/70  05/07/22 109/68  03/10/22 124/71   Pulse Readings from Last 3 Encounters:  05/27/22 (!) 51  05/07/22 70  03/10/22 (!) 58   Wt Readings from Last 3 Encounters:  05/27/22 255 lb 10.1 oz (116 kg)  05/07/22 241 lb (109.3 kg)  03/10/22 245 lb (111.1 kg)   BMI Readings from Last 3 Encounters:  05/27/22 35.65 kg/m  05/07/22 33.61 kg/m  03/10/22 34.17 kg/m    Assessment/Interventions: Review of patient past medical history, allergies, medications, health status, including review of consultants reports, laboratory and other test data, was performed as part of comprehensive evaluation and provision of chronic  care management services.   SDOH:  (Social Determinants of Health) assessments and interventions performed: Yes  SDOH Screenings   Alcohol Screen: Low Risk    Last Alcohol Screening Score (AUDIT): 1  Depression (PHQ2-9): Low Risk    PHQ-2  Score: 0  Financial Resource Strain: Low Risk    Difficulty of Paying Living Expenses: Not hard at all  Food Insecurity: No Food Insecurity   Worried About Charity fundraiser in the Last Year: Never true   Ran Out of Food in the Last Year: Never true  Housing: Low Risk    Last Housing Risk Score: 0  Physical Activity: Sufficiently Active   Days of Exercise per Week: 5 days   Minutes of Exercise per Session: 60 min  Social Connections: Engineer, building services of Communication with Friends and Family: Twice a week   Frequency of Social Gatherings with Friends and Family: Twice a week   Attends Religious Services: More than 4 times per year   Active Member of [employer] or Organizations: Yes   Attends Music therapist: More than 4 times per year   Marital Status: Married  Stress: No Stress Concern Present   Feeling of Stress : Not at all  Tobacco Use: Low Risk    Smoking Tobacco Use: Never   Smokeless Tobacco Use: Never   Passive Exposure: Not on file  Transportation Needs: No Transportation Needs   Lack of Transportation (Medical): No   Lack of Transportation (Non-Medical): No    CCM Care Plan  No Known Allergies  Medications Reviewed Today     Reviewed by Tomasa Blase, [facility] (Pharmacist) on 05/27/22 at 1308  Med List Status: <None>   Medication Order Taking? Sig Documenting Provider Last Dose Status Informant  aspirin 81 MG EC tablet [id number] Yes Take 81 mg by mouth daily. Swallow whole. [provider] Taking Active   atorvastatin (LIPITOR) 80 MG tablet [id number] Yes TAKE 1 TABLET BY MOUTH DAILY AT 6 PM. Sueanne Margarita, MD Taking Active   clopidogrel (PLAVIX) 75 MG tablet [id number] Yes TAKE 1 TABLET BY  MOUTH EVERY DAY Sueanne Margarita, MD Taking Active   levothyroxine (SYNTHROID) 150 MCG tablet [id number] Yes TAKE 1 TABLET BY MOUTH EVERY DAY BEFORE BREAKFAST Burns, Claudina Lick, MD Taking Active   metFORMIN (GLUCOPHAGE) 500 MG tablet [id number] Yes TAKE 1 TABLET (500 MG TOTAL) BY MOUTH 2 (TWO) TIMES DAILY WITH A MEAL. TAKE WITH BREAKFAST AND LUNCH Danford, Valetta Fuller D, NP Taking Active   metoprolol succinate (TOPROL-XL) 25 MG 24 hr tablet [id number] Yes Take 1 tablet (25 mg total) by mouth daily. Sueanne Margarita, MD Taking Active   nitroGLYCERIN (NITROSTAT) 0.4 MG SL tablet [id number] Yes PLACE 1 TABLET (0.4 MG TOTAL) UNDER THE TONGUE EVERY 5 (FIVE) MINUTES AS NEEDED FOR CHEST PAIN. Sueanne Margarita, MD Taking Active Self  Semaglutide,0.25 or 0.5MG/DOS, (OZEMPIC, 0.25 OR 0.5 MG/DOSE,) 2 MG/1.5ML SOPN [id number] No Inject 0.25 mg into the skin once a week.  Patient not taking: Reported on 05/27/2022   Mina Marble D, NP Not Taking Active   tamsulosin (FLOMAX) 0.4 MG CAPS capsule [id number] Yes Take 0.4 mg by mouth daily. [provider] Taking Active Self  telmisartan (MICARDIS) 80 MG tablet [id number] Yes TAKE 1 TABLET BY MOUTH EVERY DAY Turner, Eber Hong, MD Taking Active   Vitamin D, Ergocalciferol, (DRISDOL) 1.25 MG (50000 UNIT) CAPS capsule [id number] Yes Take 1 capsule (50,000 Units total) by mouth every 7 (seven) days. Mina Marble D, NP Taking Active             Patient Active Problem List   Diagnosis Date Noted   COVID-19 virus infection 05/28/2021   Recurrent dislocation of right hip 04/15/2021  Hip dislocation, right ([place]) 03/12/2021   Bradycardia 03/12/2021   NAFLD (nonalcoholic fatty liver disease) 01/03/2021   Other fatigue 01/01/2021   Shortness of breath on exertion 01/01/2021   Elevated LFTs 01/01/2021   History of ST elevation myocardial infarction (STEMI) 01/01/2021   Vitamin D deficiency 01/01/2021   Failed total hip arthroplasty ([place]) 01/04/2020   OSA treated with BiPAP  05/03/2019   CAD S/P percutaneous coronary angioplasty 01/11/2019   CAD (coronary artery disease), native coronary artery 01/11/2019   Dyslipidemia 01/11/2019   Acute ST elevation myocardial infarction (STEMI) of inferior wall ([place]) 12/30/2018   ST elevation myocardial infarction (STEMI) ([place]) 12/30/2018   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 ([place])    Prediabetes 05/11/2017   Ventral hernia without obstruction or gangrene 31/54/0086   Umbilical hernia without obstruction or gangrene 05/11/2017   OA (osteoarthritis) of hip 04/16/2016   Pre-syncope 02/22/2016   Vertigo 02/22/2016   Knee joint replacement status 01/12/2013   OA (osteoarthritis) of knee 12/27/2012   Benign prostatic hyperplasia    Hypothyroidism 09/04/2009   Obesity 09/04/2009   Essential hypertension 09/04/2009   COLONIC POLYPS, HX OF 09/04/2009    Immunization History  Administered Date(s) Administered   Fluad Quad(high Dose 65+) 09/30/2019, 10/10/2020   Influenza,inj,Quad PF,6+ Mos 10/15/2017   Influenza,inj,quad, With Preservative 09/28/2018   Influenza-Unspecified 09/28/2012, 09/28/2013, 09/28/2017   PFIZER Comirnaty(Gray Top)Covid-19 Tri-Sucrose Vaccine 03/28/2021   PFIZER(Purple Top)SARS-COV-2 Vaccination 01/18/2020, 02/08/2020, 09/11/2020   Pneumococcal Conjugate-13 08/19/2018   Pneumococcal Polysaccharide-23 11/03/2019   Td 09/04/2009   Td,absorbed, Preservative Free, Adult Use, Lf Unspecified 03/28/2021   Tdap 03/28/2021   Zoster Recombinat (Shingrix) 01/20/2018, 03/22/2018   Zoster, Live 08/12/2013    Conditions to be addressed/monitored:  Hypertension, Hyperlipidemia, and Coronary Artery Disease  Care Plan : [place]  Updates made by Tomasa Blase, RPH since 05/27/2022 12:00 AM     Problem: HTN, HLD, CAD, previous STEMI   Priority: High  Onset Date: 05/27/2022     Long-Range Goal: Disease Management   Start Date:  05/27/2022  Expected End Date: 05/28/2023  This Visit's Progress: On track  Priority: High  Note:   Current Barriers:  Chronic Disease Management support, education, and care coordination needs related to Hypertension, Hyperlipidemia, and Coronary Artery Disease  Hypertension Controlled BP Readings from Last 3 Encounters:  05/27/22 132/70  05/07/22 109/68  03/10/22 124/71  Pharmacist Clinical Goal(s): Patient will work with PharmD and providers to achieve BP goal <140/90 Current regimen:  Telmisartan 25m daily  Metoprolol succinate 25 mg daily Interventions: Discussed BP goals and benefits of medications for prevention of heart attack / stroke Recommend no changes to medications  Patient self care activities - Patient will: Check BP daily, document, and provide at future appointments Ensure daily salt intake < 2300 mg/day  Hyperlipidemia / CAD Lab Results  Component Value Date/Time   LDLCALC 61 04/22/2021 08:38 AM   LDLDIRECT 153.1 06/09/2011 09:42 AM  Pharmacist Clinical Goal(s): Patient will work with PharmD and providers to maintain LDL goal < 70 Current regimen:  Atorvastatin 80 mg daily Nitroglycerin 0.4 mg SL as needed Aspirin 81 mg daily Clopidogrel 75 mg daily Interventions: Discussed cholesterol goals and benefits of medications for prevention of heart attack / stroke Discussed bleeding risks associated with clopidogrel + aspirin and when to seek medical attention Patient self care activities - Patient will: Continue current medications Avoid NSAIDs Seek medical attention if bleeding in urine  or stool, or for head trauma  Medication management Pharmacist Clinical Goal(s): Patient will work with PharmD and providers to maintain optimal medication adherence Current pharmacy: [facility] Interventions Comprehensive medication review performed. Continue current medication management strategy Patient self care activities - Patient will: Focus on medication adherence by  pill box Take medications as prescribed Report any questions or concerns to PharmD and/or provider(s)       Medication Assistance: None required.  Patient affirms current coverage meets needs.  Care Gaps: N/a  Patient's preferred pharmacy is:  [id number] [place] [address] [id number]: [phone]: [phone]  Uses pill box? Yes Pt endorses 100% compliance  Care Plan and Follow Up Patient Decision:  Patient agrees to Care Plan and Follow-up.  Plan: Telephone follow up appointment with care management team member scheduled for:  6 months The patient has been provided with contact information for the care management team and has been advised to call with any health related questions or concerns.   DTomasa Blase PharmD Clinical Pharmacist, [place]

## 2022-05-27 NOTE — Addendum Note (Signed)
Addended by: Antonieta Iba on: 05/27/2022 08:39 AM   Modules accepted: Orders

## 2022-05-27 NOTE — Patient Instructions (Signed)
Visit Information  Following are the goals we discussed today:   Manage My Medicine   Timeframe:  Long-Range Goal Priority:  Medium Start Date:   05/27/2022                          Expected End Date: 05/28/2023                      Follow Up Date 10/2022   - call for medicine refill 2 or 3 days before it runs out - call if I am sick and can't take my medicine - keep a list of all the medicines I take; vitamins and herbals too - learn to read medicine labels - use a pillbox to sort medicine    Why is this important?   These steps will help you keep on track with your medicines.  Plan: Telephone follow up appointment with care management team member scheduled for:  6 months The patient has been provided with contact information for the care management team and has been advised to call with any health related questions or concerns.   Tomasa Blase, PharmD Clinical Pharmacist, [facility]   Please call the care guide team at [phone] if you need to cancel or reschedule your appointment.   Patient verbalizes understanding of instructions and care plan provided today and agrees to view in [place]. Active MyChart status and patient understanding of how to access instructions and care plan via MyChart confirmed with patient.

## 2022-05-27 NOTE — Progress Notes (Addendum)
Cardiology Office Note:    Date:  05/27/2022   ID:  [name], DOB 1951-08-14, MRN [id number]  PCP:  Binnie Rail, MD  Cardiologist:  Fransico Him, MD    Referring MD: Binnie Rail, MD   Chief Complaint  Patient presents with   Coronary Artery Disease   Hypertension   Hyperlipidemia    History of Present Illness:    [name] is a 71 y.o. male with a hx of ASCAD s/p inferior STEMI with cath showing 95% PDA that was felt to be the culprit vessel. This was treated with PCI-DES. Also noted was CTO CFX with collaterals from the PDA on OM1. There was an unsuccessful attempt at opening the CFX. Also has 70% mid LAD lesion, to be treated medically. EF was 60% with inferobasilar HK. Echo showed EF 65% with no WMA, grade 1 DD.  He also has a hx of OSA on CPAP, obesity, HTN and PAF on anticoagulation.    He is here today for followup and is doing well.  He denies any chest pain or pressure, SOB, DOE, PND, orthopnea, LE edema, dizziness or syncope. He tells me that on Super Bowl Sunday he had taken his meds and then drank 2 beers and noticed that his heart was racing.  He drank some water and the heart rate settled out.  It was not irregular and has not any it since. He is compliant with his meds and is tolerating meds with no SE.    He is doing well with his BiPAP device and thinks that he has gotten used to it.  He tolerates the mask and feels the pressure is adequate. Since going on PAP his feels rested in the am if he sleeps well the night before and has no significant daytime sleepiness.  He denies any significant mouth or nasal dryness or nasal congestion.  He does not think that he snores.     Past Medical History:  Diagnosis Date   Back pain    BPH (benign prostatic hypertrophy)    Chest pain    COLONIC POLYPS, HX OF 2007   clear colo w/o polyps 04/2015: 68yrfollow up   Constipation    Coronary artery disease    Decreased mobility    due to BL knee and hip replacement    Fatigue    GERD (gastroesophageal reflux disease)    Heartburn    History of kidney stones    passed   Hyperlipidemia    HYPERTENSION    HYPOTHYROIDISM    Joint pain    [facility] [place] [place]   Inferior STEMI   OA (osteoarthritis)    Knees   OBESITY    OSA treated with BiPAP    PAF (paroxysmal atrial fibrillation) (HCC)    Pre-diabetes    Shortness of breath on exertion    Umbilical hernia    Ventral hernia     Past Surgical History:  Procedure Laterality Date   arthroscopic knee surgery     (R) 2003 & (L) 2010   CARDIAC CATHETERIZATION     COLONOSCOPY  04/2015   no polyps ([place])   CORONARY/GRAFT ACUTE MI REVASCULARIZATION N/A 12/30/2018   Procedure: Coronary/Graft Acute MI Revascularization;  Surgeon: SBelva Crome MD;  [facility];  Service: Cardiovascular;  Laterality: N/A;   HIP CLOSED REDUCTION Right 03/12/2021   Procedure: CLOSED REDUCTION HIP;  Surgeon: BMelina Schools MD;  Location: [facility];  Service: Orthopedics;  Laterality: Right;   LAMINECTOMY  1991   LEFT HEART CATH AND CORONARY ANGIOGRAPHY N/A 12/30/2018   Procedure: LEFT HEART CATH AND CORONARY ANGIOGRAPHY;  Surgeon: Belva Crome, MD;  Location: [facility];  Service: Cardiovascular;  Laterality: N/A;   TONSILLECTOMY  1990   w/ adenoids and uvula   TOTAL HIP ARTHROPLASTY Right 01/2011   [place]   TOTAL HIP ARTHROPLASTY Left 04/16/2016   Procedure: TOTAL HIP ARTHROPLASTY ANTERIOR APPROACH;  Surgeon: Gaynelle Arabian, MD;  Location: [facility];  Service: Orthopedics;  Laterality: Left;   TOTAL HIP REVISION Right 01/04/2020   Procedure: Right hip bearing surface;  Surgeon: Gaynelle Arabian, MD;  Location: [facility];  Service: Orthopedics;  Laterality: Right;  151mn   TOTAL HIP REVISION Right 04/15/2021   Procedure: Right hip bearing surface revision;  Surgeon: AGaynelle Arabian MD;  Location: [facility];  Service: Orthopedics;  Laterality: Right;  988m   TOTAL KNEE ARTHROPLASTY  12/27/2012    Procedure: TOTAL KNEE BILATERAL;  Surgeon: FrGearlean AlfMD;  Location: [facility];  Service: Orthopedics;  Laterality: Bilateral;    Current Medications: Current Meds  Medication Sig   aspirin 81 MG EC tablet Take 81 mg by mouth daily. Swallow whole.   atorvastatin (LIPITOR) 80 MG tablet TAKE 1 TABLET BY MOUTH DAILY AT 6 PM.   clopidogrel (PLAVIX) 75 MG tablet TAKE 1 TABLET BY MOUTH EVERY DAY   levothyroxine (SYNTHROID) 150 MCG tablet TAKE 1 TABLET BY MOUTH EVERY DAY BEFORE BREAKFAST   metFORMIN (GLUCOPHAGE) 500 MG tablet TAKE 1 TABLET (500 MG TOTAL) BY MOUTH 2 (TWO) TIMES DAILY WITH A MEAL. TAKE WITH BREAKFAST AND LUNCH   metoprolol succinate (TOPROL-XL) 25 MG 24 hr tablet Take 1 tablet (25 mg total) by mouth daily.   nitroGLYCERIN (NITROSTAT) 0.4 MG SL tablet PLACE 1 TABLET (0.4 MG TOTAL) UNDER THE TONGUE EVERY 5 (FIVE) MINUTES AS NEEDED FOR CHEST PAIN.   tamsulosin (FLOMAX) 0.4 MG CAPS capsule Take 0.4 mg by mouth daily.   telmisartan (MICARDIS) 80 MG tablet TAKE 1 TABLET BY MOUTH EVERY DAY   Vitamin D, Ergocalciferol, (DRISDOL) 1.25 MG (50000 UNIT) CAPS capsule Take 1 capsule (50,000 Units total) by mouth every 7 (seven) days.     Allergies:   Patient has no known allergies.   Social History   Socioeconomic History   Marital status: Married    Spouse name: [name] Number of children: Not on file   Years of education: Not on file   Highest education level: Not on file  Occupational History   Occupation: Retired  Tobacco Use   Smoking status: Never   Smokeless tobacco: Never  Vaping Use   Vaping Use: Never used  Substance and Sexual Activity   Alcohol use: Yes    Alcohol/week: 0.0 standard drinks    Comment: rarely, social   Drug use: No   Sexual activity: Not on file  Other Topics Concern   Not on file  Social History Narrative   Working part time, contemplating retirement end of 2016   Lives at home with spouse      Exercise: water exercises   Social  Determinants of Health   Financial Resource Strain: Low Risk    Difficulty of Paying Living Expenses: Not hard at all  Food Insecurity: No Food Insecurity   Worried About RuCharity fundraisern the Last Year: Never true   RaSeven Milen the Last Year: Never true  Transportation Needs: No Data processing manager (Medical): No   Lack of Transportation (Non-Medical): No  Physical Activity: Sufficiently Active   Days of Exercise per Week: 5 days   Minutes of Exercise per Session: 60 min  Stress: No Stress Concern Present   Feeling of Stress : Not at all  Social Connections: Socially Integrated   Frequency of Communication with Friends and Family: Twice a week   Frequency of Social Gatherings with Friends and Family: Twice a week   Attends Religious Services: More than 4 times per year   Active Member of [employer] or Organizations: Yes   Attends Music therapist: More than 4 times per year   Marital Status: Married     Family History: The patient's family history includes ALS in his maternal grandfather; Arthritis in an other family member; Cancer in his father; Dementia in his mother; Diabetes in his father; Heart disease (age of onset: 33) in his father; Hyperlipidemia in his father; Hypertension in his father. There is no history of Colon cancer.  ROS:   Please see the history of present illness.    ROS  All other systems reviewed and negative.   EKGs/Labs/Other Studies Reviewed:    The following studies were reviewed today: none  EKG:  EKG is ordered today and demonstrates sinus bradycardia at 51bpm with no ST changes  Recent Labs: 01/09/2022: ALT 41; BUN 15; Creatinine, Ser 0.84; Potassium 4.5; Sodium 141   Recent Lipid Panel    Component Value Date/Time   CHOL 117 04/22/2021 0838   TRIG 72 04/22/2021 0838   HDL 41 04/22/2021 0838   CHOLHDL 3 12/11/2020 0946   VLDL 16.0 12/11/2020 0946   LDLCALC 61 04/22/2021 0838   LDLDIRECT 153.1  06/09/2011 0942    Physical Exam:    VS:  BP 132/70   Pulse (!) 51   Ht '5\' 11"'$  (1.803 m)   Wt 255 lb 10.1 oz (116 kg)   SpO2 98%   BMI 35.65 kg/m     Wt Readings from Last 3 Encounters:  05/27/22 255 lb 10.1 oz (116 kg)  05/07/22 241 lb (109.3 kg)  03/10/22 245 lb (111.1 kg)    GEN: Well nourished, well developed in no acute distress HEENT: Normal NECK: No JVD; No carotid bruits LYMPHATICS: No lymphadenopathy CARDIAC:RRR, no murmurs, rubs, gallops RESPIRATORY:  Clear to auscultation without rales, wheezing or rhonchi  ABDOMEN: Soft, non-tender, non-distended MUSCULOSKELETAL:  No edema; No deformity  SKIN: Warm and dry NEUROLOGIC:  Alert and oriented x 3 PSYCHIATRIC:  Normal affect    ASSESSMENT:    1. CAD S/P percutaneous coronary angioplasty   2. Essential hypertension   3. Pure hypercholesterolemia   4. OSA treated with BiPAP    PLAN:    In order of problems listed above:  1.  ASCAD -s/p inferior STEMI with cath showing 95% PDA that was felt to be the culprit vessel. This was treated with PCI-DES. Also noted was CTO CFX with collaterals from the PDA on OM1. There was an unsuccessful attempt at opening the CFX. Also has 70% mid LAD lesion, to be treated medically. EF was 60% with inferobasilar HK. -He denies any anginal symptoms -Continue prescription drug management with aspirin 81 mg daily, Plavix 25 mg daily, Toprol-XL 25 mg daily and atorvastatin 40 mg daily with as needed refills   2.  HTN -BP is well controlled on exam today -Continue prescription drug management with Toprol-XL 25 mg daily  and telmisartan 80 mg daily with as needed refills -I have personally reviewed and interpreted outside labs performed by patient's PCP which showed serum creatinine 0.84 and potassium 4.5 on 01/09/2022   3.  HLD -LDL goal < 70 -Check FLP and ALT -Continue prescription drug management with atorvastatin 40 mg daily with as needed refills  4.  OSA - The patient is  tolerating PAP therapy well without any problems. The PAP download performed by his DME was personally reviewed and interpreted by me today and showed an AHI of 4.7/hr on auto BiPAP  with 92% compliance in using more than 4 hours nightly.  The patient has been using and benefiting from PAP use and will continue to benefit from therapy.     Medication Adjustments/Labs and Tests Ordered: Current medicines are reviewed at length with the patient today.  Concerns regarding medicines are outlined above.  Orders Placed This Encounter  Procedures   EKG 12-Lead   No orders of the defined types were placed in this encounter.   Signed, Fransico Him, MD  05/27/2022 8:35 AM    [place]

## 2022-05-28 DIAGNOSIS — I10 Essential (primary) hypertension: Secondary | ICD-10-CM

## 2022-05-28 DIAGNOSIS — E785 Hyperlipidemia, unspecified: Secondary | ICD-10-CM | POA: Diagnosis not present

## 2022-05-28 DIAGNOSIS — I2510 Atherosclerotic heart disease of native coronary artery without angina pectoris: Secondary | ICD-10-CM

## 2022-06-04 DIAGNOSIS — Z23 Encounter for immunization: Secondary | ICD-10-CM | POA: Diagnosis not present

## 2022-06-09 ENCOUNTER — Other Ambulatory Visit (INDEPENDENT_AMBULATORY_CARE_PROVIDER_SITE_OTHER): Payer: Self-pay | Admitting: Adult Health

## 2022-06-09 ENCOUNTER — Ambulatory Visit (INDEPENDENT_AMBULATORY_CARE_PROVIDER_SITE_OTHER): Payer: Medicare Other | Admitting: Adult Health

## 2022-06-09 ENCOUNTER — Telehealth (INDEPENDENT_AMBULATORY_CARE_PROVIDER_SITE_OTHER): Payer: Self-pay

## 2022-06-09 ENCOUNTER — Encounter (INDEPENDENT_AMBULATORY_CARE_PROVIDER_SITE_OTHER): Payer: Self-pay

## 2022-06-09 ENCOUNTER — Encounter (INDEPENDENT_AMBULATORY_CARE_PROVIDER_SITE_OTHER): Payer: Self-pay | Admitting: Adult Health

## 2022-06-09 VITALS — BP 119/72 | HR 60 | Temp 97.8°F | Ht 71.0 in | Wt 247.0 lb

## 2022-06-09 DIAGNOSIS — E559 Vitamin D deficiency, unspecified: Secondary | ICD-10-CM

## 2022-06-09 DIAGNOSIS — R7989 Other specified abnormal findings of blood chemistry: Secondary | ICD-10-CM

## 2022-06-09 DIAGNOSIS — E669 Obesity, unspecified: Secondary | ICD-10-CM

## 2022-06-09 DIAGNOSIS — R7301 Impaired fasting glucose: Secondary | ICD-10-CM

## 2022-06-09 DIAGNOSIS — Z6834 Body mass index (BMI) 34.0-34.9, adult: Secondary | ICD-10-CM | POA: Diagnosis not present

## 2022-06-09 NOTE — Telephone Encounter (Signed)
Thank you for the update!

## 2022-06-09 NOTE — Telephone Encounter (Signed)
Patient sent a message via mychart requesting current copy of prescription drug card. We don't have a current copy on file to complete prior authorization request. Thanks!

## 2022-06-10 ENCOUNTER — Other Ambulatory Visit (INDEPENDENT_AMBULATORY_CARE_PROVIDER_SITE_OTHER): Payer: Self-pay | Admitting: Adult Health

## 2022-06-10 ENCOUNTER — Telehealth (INDEPENDENT_AMBULATORY_CARE_PROVIDER_SITE_OTHER): Payer: Self-pay | Admitting: Adult Health

## 2022-06-10 ENCOUNTER — Encounter (INDEPENDENT_AMBULATORY_CARE_PROVIDER_SITE_OTHER): Payer: Self-pay | Admitting: Adult Health

## 2022-06-10 ENCOUNTER — Encounter (INDEPENDENT_AMBULATORY_CARE_PROVIDER_SITE_OTHER): Payer: Self-pay

## 2022-06-10 LAB — VITAMIN B12: Vitamin B-12: 410 pg/mL (ref 232–1245)

## 2022-06-10 NOTE — Progress Notes (Unsigned)
Chief Complaint:   OBESITY [name] is here to discuss his progress with his obesity treatment plan along with follow-up of his obesity related diagnoses. [name] is on the Category 3 Plan and states he is following his eating plan approximately 60% of the time. [name] states he is swimming and lifting weights for 60 minutes 5 times per week.  Today's visit was #: 20 Starting weight: 266 lbs Starting date: 01/01/2021 Today's weight: 247 lbs Today's date: 06/09/2022 Total lbs lost to date: 21 Total lbs lost since last in-office visit: 0  Interim History: On 05/27/2021 office visit with cardiologist, Dr. Radford Pax, [name]'s ALT/lipid panel was completed.  No change to medication regimen recommended.  He endorses increased difficulty with plan compliance in spring/summer months.  Subjective:   1. IFG (impaired fasting glucose) Ozempic 0.5 mg prescription was sent in on 05/07/2021-never filled due to prior authorization not completed.  He remained on metformin 500 mg twice daily with meals.  2. Vitamin D deficiency On 01/09/2022 vitamin D level was 42.5, stable.  3. Elevated LFTs [name] denies right upper quadrant pain, and he denies nausea or vomiting.  Assessment/Plan:   1. IFG (impaired fasting glucose) Submit prior authorization for Ozempic, continue metformin therapy as directed.  We will check labs today.  - Hemoglobin A1c - Insulin, random - Vitamin B12  2. Vitamin D deficiency We will check labs today then MyChart the patient with results.  We will refill ergo as appropriate.  - VITAMIN D 25 Hydroxy (Vit-D Deficiency, Fractures)  3. Elevated LFTs We will check labs today.  We will follow-up with the patient at his next office visit.  - Comprehensive metabolic panel  4. Obesity with current BMI 34.4 [name] is currently in the action stage of change. As such, his goal is to continue with weight loss efforts. He has agreed to the Category 3 Plan.   Exercise goals:  As is.  Behavioral modification strategies: increasing lean protein intake, decreasing simple carbohydrates, meal planning and cooking strategies, keeping healthy foods in the home, and planning for success.  [name] has agreed to follow-up with our clinic in 4 weeks. He was informed of the importance of frequent follow-up visits to maximize his success with intensive lifestyle modifications for his multiple health conditions.   [name] was informed we would discuss his lab results at his next visit unless there is a critical issue that needs to be addressed sooner. [name] agreed to keep his next visit at the agreed upon time to discuss these results.  Objective:   Blood pressure 119/72, pulse 60, temperature 97.8 F (36.6 C), height '5\' 11"'$  (1.803 m), weight 247 lb (112 kg), SpO2 97 %. Body mass index is 34.45 kg/m.  General: Cooperative, alert, well developed, in no acute distress. HEENT: Conjunctivae and lids unremarkable. Cardiovascular: Regular rhythm.  Lungs: Normal work of breathing. Neurologic: No focal deficits.   Lab Results  Component Value Date   CREATININE 0.71 (L) 06/09/2022   BUN 15 06/09/2022   NA 139 06/09/2022   K 4.5 06/09/2022   CL 105 06/09/2022   CO2 20 06/09/2022   Lab Results  Component Value Date   ALT 41 06/09/2022   AST 35 06/09/2022   ALKPHOS 82 06/09/2022   BILITOT 0.7 06/09/2022   Lab Results  Component Value Date   HGBA1C 5.8 (H) 06/09/2022   HGBA1C 5.5 01/09/2022   HGBA1C 5.8 (H) 08/19/2021   HGBA1C 5.3 03/12/2021   HGBA1C 6.0 12/11/2020  Lab Results  Component Value Date   INSULIN WILL FOLLOW 06/09/2022   INSULIN 8.4 01/09/2022   INSULIN 22.7 08/19/2021   INSULIN 19.1 04/22/2021   INSULIN 34.7 (H) 01/01/2021   Lab Results  Component Value Date   TSH 0.581 04/22/2021   Lab Results  Component Value Date   CHOL 117 05/27/2022   HDL 42 05/27/2022   LDLCALC 62 05/27/2022   LDLDIRECT 153.1 06/09/2011   TRIG 59 05/27/2022    CHOLHDL 2.8 05/27/2022   Lab Results  Component Value Date   VD25OH 80.1 06/09/2022   VD25OH 42.5 01/09/2022   VD25OH 67.0 04/22/2021   Lab Results  Component Value Date   WBC 7.0 04/16/2021   HGB 13.8 04/16/2021   HCT 40.4 04/16/2021   MCV 90.4 04/16/2021   PLT 124 (L) 04/16/2021   No results found for: "IRON", "TIBC", "FERRITIN"  Obesity Behavioral Intervention:   Approximately 15 minutes were spent on the discussion below.  ASK: We discussed the diagnosis of obesity with [name] today and [name] agreed to give [place] permission to discuss obesity behavioral modification therapy today.  ASSESS: [name] has the diagnosis of obesity and his BMI today is 34.4. [name] is in the action stage of change.   ADVISE: [name] was educated on the multiple health risks of obesity as well as the benefit of weight loss to improve his health. He was advised of the need for long term treatment and the importance of lifestyle modifications to improve his current health and to decrease his risk of future health problems.  AGREE: Multiple dietary modification options and treatment options were discussed and [name] agreed to follow the recommendations documented in the above note.  ARRANGE: [name] was educated on the importance of frequent visits to treat obesity as outlined per CMS and USPSTF guidelines and agreed to schedule his next follow up appointment today.  Attestation Statements:   Reviewed by clinician on day of visit: allergies, medications, problem list, medical history, surgical history, family history, social history, and previous encounter notes.   Wilhemena Durie, am acting as transcriptionist for Mina Marble, NP.  I have reviewed the above documentation for accuracy and completeness, and I agree with the above. -  ***

## 2022-06-10 NOTE — Telephone Encounter (Signed)
Mina Marble - Prior authorization for Ozempic is not required per insurance. Patient sent message via mychart.

## 2022-06-11 ENCOUNTER — Encounter: Payer: Self-pay | Admitting: Internal Medicine

## 2022-06-11 DIAGNOSIS — R7301 Impaired fasting glucose: Secondary | ICD-10-CM | POA: Insufficient documentation

## 2022-06-11 NOTE — Patient Instructions (Addendum)
     Blood work was ordered.     Medications changes include-- none   Your prescription(s) have been sent to your pharmacy.     Return in about 1 year (around 06/13/2023) for follow up.

## 2022-06-11 NOTE — Progress Notes (Signed)
Subjective:    Patient ID: [name], male    DOB: 10-28-1951, 71 y.o.   MRN: [id number]     HPI [name] is here for follow up of his chronic medical problems, including CAD s/p STEMI, htn, OSA, prediabetes, hypothyroidism, Hld  Swims 5 times a week.  He is lifting weights.  [facility]   Medications and allergies reviewed with patient and updated if appropriate.  Current Outpatient Medications on File Prior to Visit  Medication Sig Dispense Refill   aspirin 81 MG EC tablet Take 81 mg by mouth daily. Swallow whole.     atorvastatin (LIPITOR) 80 MG tablet TAKE 1 TABLET BY MOUTH DAILY AT 6 PM. 90 tablet 3   clopidogrel (PLAVIX) 75 MG tablet TAKE 1 TABLET BY MOUTH EVERY DAY 90 tablet 0   levothyroxine (SYNTHROID) 150 MCG tablet TAKE 1 TABLET BY MOUTH EVERY DAY BEFORE BREAKFAST 90 tablet 3   metFORMIN (GLUCOPHAGE) 500 MG tablet TAKE 1 TABLET (500 MG TOTAL) BY MOUTH 2 (TWO) TIMES DAILY WITH A MEAL. TAKE WITH BREAKFAST AND LUNCH 60 tablet 1   metoprolol succinate (TOPROL-XL) 25 MG 24 hr tablet Take 1 tablet (25 mg total) by mouth daily. 90 tablet 3   nitroGLYCERIN (NITROSTAT) 0.4 MG SL tablet PLACE 1 TABLET (0.4 MG TOTAL) UNDER THE TONGUE EVERY 5 (FIVE) MINUTES AS NEEDED FOR CHEST PAIN. 25 tablet 3   tamsulosin (FLOMAX) 0.4 MG CAPS capsule Take 0.4 mg by mouth daily.     telmisartan (MICARDIS) 80 MG tablet TAKE 1 TABLET BY MOUTH EVERY DAY 90 tablet 0   No current facility-administered medications on file prior to visit.     Review of Systems  Constitutional:  Negative for fatigue and fever.  HENT:         Throat clearing - ? allergies  Respiratory:  Negative for cough, shortness of breath and wheezing.   Cardiovascular:  Negative for chest pain, palpitations and leg swelling.  Neurological:  Negative for light-headedness and headaches.       Objective:   Vitals:   06/12/22 0943  BP: 112/68  Pulse: 60  Temp: 98.4 F (36.9 C)   SpO2: 96%   BP Readings from Last 3 Encounters:  06/12/22 112/68  06/09/22 119/72  05/27/22 132/70   Wt Readings from Last 3 Encounters:  06/12/22 250 lb (113.4 kg)  06/09/22 247 lb (112 kg)  05/27/22 255 lb 10.1 oz (116 kg)   Body mass index is 34.87 kg/m.    Physical Exam Constitutional:      General: He is not in acute distress.    Appearance: Normal appearance. He is not ill-appearing.  HENT:     Head: Normocephalic and atraumatic.  Eyes:     Conjunctiva/sclera: Conjunctivae normal.  Cardiovascular:     Rate and Rhythm: Normal rate and regular rhythm.     Heart sounds: Normal heart sounds. No murmur heard. Pulmonary:     Effort: Pulmonary effort is normal. No respiratory distress.     Breath sounds: Normal breath sounds. No wheezing or rales.  Musculoskeletal:     Right lower leg: No edema.     Left lower leg: No edema.  Skin:    General: Skin is warm and dry.     Findings: No rash.  Neurological:     Mental Status: He is alert. Mental status is at baseline.  Psychiatric:        Mood  and Affect: Mood normal.        Lab Results  Component Value Date   WBC 7.0 04/16/2021   HGB 13.8 04/16/2021   HCT 40.4 04/16/2021   PLT 124 (L) 04/16/2021   GLUCOSE 94 06/09/2022   CHOL 117 05/27/2022   TRIG 59 05/27/2022   HDL 42 05/27/2022   LDLDIRECT 153.1 06/09/2011   LDLCALC 62 05/27/2022   ALT 41 06/09/2022   AST 35 06/09/2022   NA 139 06/09/2022   K 4.5 06/09/2022   CL 105 06/09/2022   CREATININE 0.71 (L) 06/09/2022   BUN 15 06/09/2022   CO2 20 06/09/2022   TSH 0.581 04/22/2021   PSA 5.72 (H) 12/15/2011   INR 1.1 04/05/2021   HGBA1C 5.8 (H) 06/09/2022     Assessment & Plan:    See Problem List for Assessment and Plan of chronic medical problems.

## 2022-06-12 ENCOUNTER — Ambulatory Visit (INDEPENDENT_AMBULATORY_CARE_PROVIDER_SITE_OTHER): Payer: Medicare Other | Admitting: Internal Medicine

## 2022-06-12 VITALS — BP 112/68 | HR 60 | Temp 98.4°F | Ht 71.0 in | Wt 250.0 lb

## 2022-06-12 DIAGNOSIS — I10 Essential (primary) hypertension: Secondary | ICD-10-CM

## 2022-06-12 DIAGNOSIS — E034 Atrophy of thyroid (acquired): Secondary | ICD-10-CM | POA: Diagnosis not present

## 2022-06-12 DIAGNOSIS — R7303 Prediabetes: Secondary | ICD-10-CM | POA: Diagnosis not present

## 2022-06-12 DIAGNOSIS — E785 Hyperlipidemia, unspecified: Secondary | ICD-10-CM

## 2022-06-12 DIAGNOSIS — Z9861 Coronary angioplasty status: Secondary | ICD-10-CM

## 2022-06-12 DIAGNOSIS — I2510 Atherosclerotic heart disease of native coronary artery without angina pectoris: Secondary | ICD-10-CM | POA: Diagnosis not present

## 2022-06-12 DIAGNOSIS — I25750 Atherosclerosis of native coronary artery of transplanted heart with unstable angina: Secondary | ICD-10-CM

## 2022-06-12 LAB — TSH: TSH: 0.14 u[IU]/mL — ABNORMAL LOW (ref 0.35–5.50)

## 2022-06-12 LAB — T3, FREE: T3, Free: 3.7 pg/mL (ref 2.3–4.2)

## 2022-06-12 LAB — T4, FREE: Free T4: 1.35 ng/dL (ref 0.60–1.60)

## 2022-06-12 MED ORDER — LEVOTHYROXINE SODIUM 150 MCG PO TABS: ORAL_TABLET | ORAL | 3 refills | Status: DC

## 2022-06-12 MED ORDER — LEVOTHYROXINE SODIUM 137 MCG PO TABS
137.0000 ug | ORAL_TABLET | Freq: Every day | ORAL | 3 refills | Status: DC
Start: 2022-06-12 — End: 2023-08-26

## 2022-06-12 NOTE — Assessment & Plan Note (Signed)
Chronic Managed by cardiology Continue atorvastatin 80 mg daily

## 2022-06-12 NOTE — Assessment & Plan Note (Signed)
Chronic Lab Results  Component Value Date   HGBA1C 5.8 (H) 06/09/2022   On metformin 500 mg twice daily

## 2022-06-12 NOTE — Assessment & Plan Note (Signed)
Chronic Blood pressure well controlled Continue continue metoprolol XL 25 mg daily, telmisartan 80 mg daily

## 2022-06-12 NOTE — Assessment & Plan Note (Signed)
Chronic  Clinically euthyroid Currently taking levothyroxine 150 mcg daily Check tsh  Titrate med dose if needed

## 2022-06-12 NOTE — Addendum Note (Signed)
Addended by: Binnie Rail on: 06/12/2022 08:36 PM   Modules accepted: Orders

## 2022-06-12 NOTE — Assessment & Plan Note (Addendum)
Chronic S/p STEMI Following with cardiology Continue aspirin 81 mg daily, atorvastatin 80 mg daily, Plavix 75 mg daily Blood pressure well controlled-continue good BP medications

## 2022-06-12 NOTE — Assessment & Plan Note (Signed)
Chronic S/p STEMI Following with cardiology Continue aspirin 81 mg daily, atorvastatin 80 mg daily, Plavix 75 mg daily Blood pressure well controlled-continue good BP medications

## 2022-06-16 ENCOUNTER — Other Ambulatory Visit (INDEPENDENT_AMBULATORY_CARE_PROVIDER_SITE_OTHER): Payer: Self-pay | Admitting: Adult Health

## 2022-06-16 ENCOUNTER — Encounter (INDEPENDENT_AMBULATORY_CARE_PROVIDER_SITE_OTHER): Payer: Self-pay | Admitting: Adult Health

## 2022-06-16 MED ORDER — SEMAGLUTIDE(0.25 OR 0.5MG/DOS) 2 MG/3ML ~~LOC~~ SOPN: 0.2500 mg | PEN_INJECTOR | SUBCUTANEOUS | 0 refills | Status: DC

## 2022-06-16 NOTE — Telephone Encounter (Signed)
FYI

## 2022-06-17 LAB — VITAMIN D 25 HYDROXY (VIT D DEFICIENCY, FRACTURES): Vit D, 25-Hydroxy: 80.1 ng/mL (ref 30.0–100.0)

## 2022-06-17 LAB — COMPREHENSIVE METABOLIC PANEL
ALT: 41 IU/L (ref 0–44)
AST: 35 IU/L (ref 0–40)
Albumin/Globulin Ratio: 1.7 (ref 1.2–2.2)
Albumin: 4.5 g/dL (ref 3.8–4.8)
Alkaline Phosphatase: 82 IU/L (ref 44–121)
BUN/Creatinine Ratio: 21 (ref 10–24)
BUN: 15 mg/dL (ref 8–27)
Bilirubin Total: 0.7 mg/dL (ref 0.0–1.2)
CO2: 20 mmol/L (ref 20–29)
Calcium: 9.5 mg/dL (ref 8.6–10.2)
Chloride: 105 mmol/L (ref 96–106)
Creatinine, Ser: 0.71 mg/dL — ABNORMAL LOW (ref 0.76–1.27)
Globulin, Total: 2.6 g/dL (ref 1.5–4.5)
Glucose: 94 mg/dL (ref 70–99)
Potassium: 4.5 mmol/L (ref 3.5–5.2)
Sodium: 139 mmol/L (ref 134–144)
Total Protein: 7.1 g/dL (ref 6.0–8.5)
eGFR: 100 mL/min/{1.73_m2} (ref >59)

## 2022-06-17 LAB — INSULIN, RANDOM: INSULIN: 15.2 u[IU]/mL (ref 2.6–24.9)

## 2022-06-17 LAB — HEMOGLOBIN A1C
Est. average glucose Bld gHb Est-mCnc: 120 mg/dL
Hgb A1c MFr Bld: 5.8 % — ABNORMAL HIGH (ref 4.8–5.6)

## 2022-06-22 ENCOUNTER — Other Ambulatory Visit (INDEPENDENT_AMBULATORY_CARE_PROVIDER_SITE_OTHER): Payer: Self-pay | Admitting: Adult Health

## 2022-06-22 DIAGNOSIS — R7303 Prediabetes: Secondary | ICD-10-CM

## 2022-06-24 ENCOUNTER — Other Ambulatory Visit: Payer: Self-pay | Admitting: Cardiology

## 2022-07-08 ENCOUNTER — Ambulatory Visit (INDEPENDENT_AMBULATORY_CARE_PROVIDER_SITE_OTHER): Payer: Medicare Other | Admitting: Adult Health

## 2022-07-08 ENCOUNTER — Encounter (INDEPENDENT_AMBULATORY_CARE_PROVIDER_SITE_OTHER): Payer: Self-pay | Admitting: Adult Health

## 2022-07-08 VITALS — BP 106/66 | HR 82 | Temp 97.6°F | Ht 71.0 in | Wt 245.0 lb

## 2022-07-08 DIAGNOSIS — Z6834 Body mass index (BMI) 34.0-34.9, adult: Secondary | ICD-10-CM

## 2022-07-08 DIAGNOSIS — R7303 Prediabetes: Secondary | ICD-10-CM

## 2022-07-08 DIAGNOSIS — E038 Other specified hypothyroidism: Secondary | ICD-10-CM

## 2022-07-08 DIAGNOSIS — E559 Vitamin D deficiency, unspecified: Secondary | ICD-10-CM

## 2022-07-08 DIAGNOSIS — E669 Obesity, unspecified: Secondary | ICD-10-CM | POA: Diagnosis not present

## 2022-07-08 MED ORDER — METFORMIN HCL 500 MG PO TABS: ORAL_TABLET | ORAL | 1 refills | Status: DC

## 2022-07-09 NOTE — Progress Notes (Signed)
Chief Complaint:   OBESITY [name] is here to discuss his progress with his obesity treatment plan along with follow-up of his obesity related diagnoses. [name] is on the Category 3 Plan and states he is following his eating plan approximately 65% of the time. [name] states he is swimming and lifting weights for 45-60 minutes 5 times per week.  Today's visit was #: 21 Starting weight: 266 lbs Starting date: 01/01/2021 Today's weight: 245 lbs Today's date: 07/08/2022 Total lbs lost to date: 21 Total lbs lost since last in-office visit: 2  Interim History:  [name]'s 69 year old sister passed away on 10-Jul-2022, from pancreatic cancer (5 months to the day of initial diagnosis).   He and his wife will travel to [place] on 07/12/2022, for his sister's funeral. She and he were very close, and they spoke 5 times per week via telephone.  Subjective:   1. Pre-diabetes [name] is on metformin 500 mg twice daily with meals, and he is tolerating it well.   His insurance will cover Ozempic, however Rx is cost prohibitive at >$500 per month.   On 06/09/2022 his blood glucose was 94, A1c 5.8, and insulin 15.2.   I discussed labs with the patient today.  2. Vitamin D deficiency [name] is on OTC vitamin D3 1,000 IU, 3 capsules/day = 3,000 IU QD. On 06/09/2022 his vitamin D level was 80.1.   I discussed labs with the patient today.  3. Other specified hypothyroidism On 06/12/2022 [name]'s TSH was 0.14, suppressed.   PCP decreased his levothyroxine from 150 mcg to 137 mcg once daily.   I discussed labs with the patient today.  Assessment/Plan:   1. Pre-diabetes We will refill metformin 500 mg twice daily with meals for 2 months.  - metFORMIN (GLUCOPHAGE) 500 MG tablet; TAKE 1 TABLET (500 MG TOTAL) BY MOUTH 2 (TWO) TIMES DAILY WITH A MEAL. TAKE WITH BREAKFAST AND LUNCH  Dispense: 60 tablet; Refill: 1  2. Vitamin D deficiency [name] is to decrease from 3 capsules to 2 capsules of  vitamin D3 1,000 IU = 2000 IU QD.  3. Other specified hypothyroidism [name] will continue to reduce levothyroxine dose, and he will follow-up with his PCP as directed.  4. Obesity with current BMI 34.3 [name] is currently in the action stage of change. As such, his goal is to continue with weight loss efforts. He has agreed to the Category 3 Plan.   Exercise goals: As is.   Behavioral modification strategies: increasing lean protein intake, decreasing simple carbohydrates, meal planning and cooking strategies, keeping healthy foods in the home, and planning for success.  [name] has agreed to follow-up with our clinic in 4 weeks. He was informed of the importance of frequent follow-up visits to maximize his success with intensive lifestyle modifications for his multiple health conditions.   Objective:   Blood pressure 106/66, pulse 82, temperature 97.6 F (36.4 C), height '5\' 11"'$  (1.803 m), weight 245 lb (111.1 kg), SpO2 97 %. Body mass index is 34.17 kg/m.  General: Cooperative, alert, well developed, in no acute distress. HEENT: Conjunctivae and lids unremarkable. Cardiovascular: Regular rhythm.  Lungs: Normal work of breathing. Neurologic: No focal deficits.   Lab Results  Component Value Date   CREATININE 0.71 (L) 06/09/2022   BUN 15 06/09/2022   NA 139 06/09/2022   K 4.5 06/09/2022   CL 105 06/09/2022   CO2 20 06/09/2022   Lab Results  Component Value Date   ALT 41 06/09/2022  AST 35 06/09/2022   ALKPHOS 82 06/09/2022   BILITOT 0.7 06/09/2022   Lab Results  Component Value Date   HGBA1C 5.8 (H) 06/09/2022   HGBA1C 5.5 01/09/2022   HGBA1C 5.8 (H) 08/19/2021   HGBA1C 5.3 03/12/2021   HGBA1C 6.0 12/11/2020   Lab Results  Component Value Date   INSULIN 15.2 06/09/2022   INSULIN 8.4 01/09/2022   INSULIN 22.7 08/19/2021   INSULIN 19.1 04/22/2021   INSULIN 34.7 (H) 01/01/2021   Lab Results  Component Value Date   TSH 0.14 (L) 06/12/2022   Lab Results   Component Value Date   CHOL 117 05/27/2022   HDL 42 05/27/2022   LDLCALC 62 05/27/2022   LDLDIRECT 153.1 06/09/2011   TRIG 59 05/27/2022   CHOLHDL 2.8 05/27/2022   Lab Results  Component Value Date   VD25OH 80.1 06/09/2022   VD25OH 42.5 01/09/2022   VD25OH 67.0 04/22/2021   Lab Results  Component Value Date   WBC 7.0 04/16/2021   HGB 13.8 04/16/2021   HCT 40.4 04/16/2021   MCV 90.4 04/16/2021   PLT 124 (L) 04/16/2021   No results found for: "IRON", "TIBC", "FERRITIN"  Obesity Behavioral Intervention:   Approximately 15 minutes were spent on the discussion below.  ASK: We discussed the diagnosis of obesity with [name] today and [name] agreed to give [place] permission to discuss obesity behavioral modification therapy today.  ASSESS: [name] has the diagnosis of obesity and his BMI today is 34.3. [name] is in the action stage of change.   ADVISE: [name] was educated on the multiple health risks of obesity as well as the benefit of weight loss to improve his health. He was advised of the need for long term treatment and the importance of lifestyle modifications to improve his current health and to decrease his risk of future health problems.  AGREE: Multiple dietary modification options and treatment options were discussed and [name] agreed to follow the recommendations documented in the above note.  ARRANGE: [name] was educated on the importance of frequent visits to treat obesity as outlined per CMS and USPSTF guidelines and agreed to schedule his next follow up appointment today.  Attestation Statements:   Reviewed by clinician on day of visit: allergies, medications, problem list, medical history, surgical history, family history, social history, and previous encounter notes.  Wilhemena Durie, am acting as transcriptionist for Mina Marble, NP.  I have reviewed the above documentation for accuracy and completeness, and I agree with the above. -  Vadhir Mcnay d. Saraiya Kozma,  NP-C

## 2022-08-05 ENCOUNTER — Ambulatory Visit (INDEPENDENT_AMBULATORY_CARE_PROVIDER_SITE_OTHER): Payer: Medicare Other | Admitting: Adult Health

## 2022-08-06 ENCOUNTER — Other Ambulatory Visit (INDEPENDENT_AMBULATORY_CARE_PROVIDER_SITE_OTHER): Payer: Self-pay | Admitting: Adult Health

## 2022-08-06 ENCOUNTER — Encounter (INDEPENDENT_AMBULATORY_CARE_PROVIDER_SITE_OTHER): Payer: Self-pay

## 2022-08-06 DIAGNOSIS — R7303 Prediabetes: Secondary | ICD-10-CM

## 2022-08-19 ENCOUNTER — Other Ambulatory Visit: Payer: Self-pay | Admitting: Cardiology

## 2022-08-19 ENCOUNTER — Other Ambulatory Visit: Payer: Self-pay

## 2022-08-19 DIAGNOSIS — M5136 Other intervertebral disc degeneration, lumbar region: Secondary | ICD-10-CM | POA: Insufficient documentation

## 2022-08-19 DIAGNOSIS — M47816 Spondylosis without myelopathy or radiculopathy, lumbar region: Secondary | ICD-10-CM | POA: Insufficient documentation

## 2022-08-27 ENCOUNTER — Ambulatory Visit (INDEPENDENT_AMBULATORY_CARE_PROVIDER_SITE_OTHER): Payer: Medicare Other | Admitting: Adult Health

## 2022-08-27 ENCOUNTER — Encounter (INDEPENDENT_AMBULATORY_CARE_PROVIDER_SITE_OTHER): Payer: Self-pay | Admitting: Adult Health

## 2022-08-27 VITALS — BP 108/68 | HR 65 | Temp 97.8°F | Ht 71.0 in | Wt 249.0 lb

## 2022-08-27 DIAGNOSIS — Z6834 Body mass index (BMI) 34.0-34.9, adult: Secondary | ICD-10-CM

## 2022-08-27 DIAGNOSIS — E669 Obesity, unspecified: Secondary | ICD-10-CM

## 2022-08-27 DIAGNOSIS — R7303 Prediabetes: Secondary | ICD-10-CM | POA: Diagnosis not present

## 2022-08-27 MED ORDER — METFORMIN HCL 500 MG PO TABS: ORAL_TABLET | ORAL | 1 refills | Status: DC

## 2022-08-31 NOTE — Progress Notes (Unsigned)
Chief Complaint:   OBESITY Fabricio is here to discuss his progress with his obesity treatment plan along with follow-up of his obesity related diagnoses. Rourke is on the Category 3 Plan and states he is following his eating plan approximately 50% of the time. Yuta states he is swimming and lifting weights 45-60 minutes 3-5 times per week.  Today's visit was #: 57 Starting weight: 266 lbs Starting date: 01/01/2021 Today's weight: 249 lbs Today's date: 08/27/2022 Total lbs lost to date: 17 lbs Total lbs lost since last in-office visit: +4 lbs  Interim History: He swims daily in the late afternoon.  He lifts weights in the early morning.  Busy summer= ready to refocus on category 3 meal plan.   Subjective:   1. Pre-diabetes 06/09/2022, A1c 5.8, CMP ***, GFR ***. His father had T2DM.   Assessment/Plan:   1. Pre-diabetes Refill - metFORMIN (GLUCOPHAGE) 500 MG tablet; TAKE 1 TABLET (500 MG TOTAL) BY MOUTH 2 (TWO) TIMES DAILY WITH A MEAL. TAKE WITH BREAKFAST AND LUNCH  Dispense: 60 tablet; Refill: 1  2. Obesity with current BMI 34.8 Jehad is currently in the action stage of change. As such, his goal is to continue with weight loss efforts. He has agreed to the Category 3 Plan.   Exercise goals:  as is.   Behavioral modification strategies: keeping a strict food journal.  Kongmeng has agreed to follow-up with our clinic in 4 weeks. He was informed of the importance of frequent follow-up visits to maximize his success with intensive lifestyle modifications for his multiple health conditions.   Objective:   Blood pressure 108/68, pulse 65, temperature 97.8 F (36.6 C), height '5\' 11"'$  (1.803 m), weight 249 lb (112.9 kg), SpO2 97 %. Body mass index is 34.73 kg/m.  General: Cooperative, alert, well developed, in no acute distress. HEENT: Conjunctivae and lids unremarkable. Cardiovascular: Regular rhythm.  Lungs: Normal work of breathing. Neurologic: No focal deficits.    Lab Results  Component Value Date   CREATININE 0.71 (L) 06/09/2022   BUN 15 06/09/2022   NA 139 06/09/2022   K 4.5 06/09/2022   CL 105 06/09/2022   CO2 20 06/09/2022   Lab Results  Component Value Date   ALT 41 06/09/2022   AST 35 06/09/2022   ALKPHOS 82 06/09/2022   BILITOT 0.7 06/09/2022   Lab Results  Component Value Date   HGBA1C 5.8 (H) 06/09/2022   HGBA1C 5.5 01/09/2022   HGBA1C 5.8 (H) 08/19/2021   HGBA1C 5.3 03/12/2021   HGBA1C 6.0 12/11/2020   Lab Results  Component Value Date   INSULIN 15.2 06/09/2022   INSULIN 8.4 01/09/2022   INSULIN 22.7 08/19/2021   INSULIN 19.1 04/22/2021   INSULIN 34.7 (H) 01/01/2021   Lab Results  Component Value Date   TSH 0.14 (L) 06/12/2022   Lab Results  Component Value Date   CHOL 117 05/27/2022   HDL 42 05/27/2022   LDLCALC 62 05/27/2022   LDLDIRECT 153.1 06/09/2011   TRIG 59 05/27/2022   CHOLHDL 2.8 05/27/2022   Lab Results  Component Value Date   VD25OH 80.1 06/09/2022   VD25OH 42.5 01/09/2022   VD25OH 67.0 04/22/2021   Lab Results  Component Value Date   WBC 7.0 04/16/2021   HGB 13.8 04/16/2021   HCT 40.4 04/16/2021   MCV 90.4 04/16/2021   PLT 124 (L) 04/16/2021   No results found for: "IRON", "TIBC", "FERRITIN"  Obesity Behavioral Intervention:   Approximately 15 minutes were spent  on the discussion below.  ASK: We discussed the diagnosis of obesity with [name] today and Jedrek agreed to give [place] permission to discuss obesity behavioral modification therapy today.  ASSESS: Dugan has the diagnosis of obesity and his BMI today is 34.8. Javarion is in the action stage of change.   ADVISE: Brandin was educated on the multiple health risks of obesity as well as the benefit of weight loss to improve his health. He was advised of the need for long term treatment and the importance of lifestyle modifications to improve his current health and to decrease his risk of future health  problems.  AGREE: Multiple dietary modification options and treatment options were discussed and Shakur agreed to follow the recommendations documented in the above note.  ARRANGE: Ab was educated on the importance of frequent visits to treat obesity as outlined per CMS and USPSTF guidelines and agreed to schedule his next follow up appointment today.  Attestation Statements:   Reviewed by clinician on day of visit: allergies, medications, problem list, medical history, surgical history, family history, social history, and previous encounter notes.  I, Davy Pique, RMA, am acting as Location manager for Mina Marble, NP.  I have reviewed the above documentation for accuracy and completeness, and I agree with the above. -  ***

## 2022-09-03 DIAGNOSIS — H0012 Chalazion right lower eyelid: Secondary | ICD-10-CM | POA: Diagnosis not present

## 2022-09-19 ENCOUNTER — Other Ambulatory Visit (INDEPENDENT_AMBULATORY_CARE_PROVIDER_SITE_OTHER): Payer: Self-pay | Admitting: Adult Health

## 2022-09-19 DIAGNOSIS — R7303 Prediabetes: Secondary | ICD-10-CM

## 2022-09-24 ENCOUNTER — Encounter (INDEPENDENT_AMBULATORY_CARE_PROVIDER_SITE_OTHER): Payer: Self-pay | Admitting: Adult Health

## 2022-09-24 ENCOUNTER — Ambulatory Visit (INDEPENDENT_AMBULATORY_CARE_PROVIDER_SITE_OTHER): Payer: Medicare Other | Admitting: Adult Health

## 2022-09-24 VITALS — BP 108/69 | HR 57 | Temp 97.6°F | Ht 71.0 in | Wt 248.0 lb

## 2022-09-24 DIAGNOSIS — G4733 Obstructive sleep apnea (adult) (pediatric): Secondary | ICD-10-CM | POA: Diagnosis not present

## 2022-09-24 DIAGNOSIS — Z6834 Body mass index (BMI) 34.0-34.9, adult: Secondary | ICD-10-CM

## 2022-09-24 DIAGNOSIS — E669 Obesity, unspecified: Secondary | ICD-10-CM

## 2022-09-24 DIAGNOSIS — R7303 Prediabetes: Secondary | ICD-10-CM

## 2022-09-24 MED ORDER — METFORMIN HCL 500 MG PO TABS
ORAL_TABLET | ORAL | 1 refills | Status: DC
Start: 2022-09-24 — End: 2022-10-23

## 2022-09-26 NOTE — Progress Notes (Unsigned)
Chief Complaint:   OBESITY [name] is here to discuss his progress with his obesity treatment plan along with follow-up of his obesity related diagnoses. [name] is on the Category 3 Plan and states he is following his eating plan approximately 70% of the time. [name] states he is swimming and weights 45-50 minutes 3-5 times per week.  Today's visit was #: 23 Starting weight: 266 lbs Starting date: 01/01/2021 Today's weight: 248 lbs Today's date: 09/24/2022 Total lbs lost to date: 18 lbs Total lbs lost since last in-office visit: 1 lb  Interim History: Bioimpedance results:  Muscle mass ***, Adipose mass ***.  He has upped rep count with weight lifting. Bedtime 2130, rise 0430.  Cardiologist manages CPAP/OSA.   Subjective:   1. Pre-diabetes 06/09/2022, CMP GFR 100.  A1c ***.  He is on Metformin 500 mg BID with meals, denies GI upset.   2. OSA (obstructive sleep apnea) He reports 100% compliance of CPAP.  OSA managed by Cardiology.  He denies excessive daytime fatigue.   Assessment/Plan:   1. Pre-diabetes Refill - metFORMIN (GLUCOPHAGE) 500 MG tablet; TAKE 1 TABLET (500 MG TOTAL) BY MOUTH 2 (TWO) TIMES DAILY WITH A MEAL  Dispense: 60 tablet; Refill: 1  2. OSA (obstructive sleep apnea) Continue nightly CPAP use and with weight loss efforts.   3. Obesity with current BMI 34.6 Handouts:  High protein, low calorie handout.   Lajuane is currently in the action stage of change. As such, his goal is to continue with weight loss efforts. He has agreed to the Category 3 Plan.   Exercise goals:  As is.   Behavioral modification strategies: increasing lean protein intake, decreasing simple carbohydrates, meal planning and cooking strategies, keeping healthy foods in the home, and planning for success.  Kamaury has agreed to follow-up with our clinic in 4 weeks. He was informed of the importance of frequent follow-up visits to maximize his success with intensive lifestyle  modifications for his multiple health conditions.   Objective:   Blood pressure 108/69, pulse (!) 57, temperature 97.6 F (36.4 C), height '5\' 11"'$  (1.803 m), weight 248 lb (112.5 kg), SpO2 96 %. Body mass index is 34.59 kg/m.  General: Cooperative, alert, well developed, in no acute distress. HEENT: Conjunctivae and lids unremarkable. Cardiovascular: Regular rhythm.  Lungs: Normal work of breathing. Neurologic: No focal deficits.   Lab Results  Component Value Date   CREATININE 0.71 (L) 06/09/2022   BUN 15 06/09/2022   NA 139 06/09/2022   K 4.5 06/09/2022   CL 105 06/09/2022   CO2 20 06/09/2022   Lab Results  Component Value Date   ALT 41 06/09/2022   AST 35 06/09/2022   ALKPHOS 82 06/09/2022   BILITOT 0.7 06/09/2022   Lab Results  Component Value Date   HGBA1C 5.8 (H) 06/09/2022   HGBA1C 5.5 01/09/2022   HGBA1C 5.8 (H) 08/19/2021   HGBA1C 5.3 03/12/2021   HGBA1C 6.0 12/11/2020   Lab Results  Component Value Date   INSULIN 15.2 06/09/2022   INSULIN 8.4 01/09/2022   INSULIN 22.7 08/19/2021   INSULIN 19.1 04/22/2021   INSULIN 34.7 (H) 01/01/2021   Lab Results  Component Value Date   TSH 0.14 (L) 06/12/2022   Lab Results  Component Value Date   CHOL 117 05/27/2022   HDL 42 05/27/2022   LDLCALC 62 05/27/2022   LDLDIRECT 153.1 06/09/2011   TRIG 59 05/27/2022   CHOLHDL 2.8 05/27/2022   Lab Results  Component Value  Date   VD25OH 80.1 06/09/2022   VD25OH 42.5 01/09/2022   VD25OH 67.0 04/22/2021   Lab Results  Component Value Date   WBC 7.0 04/16/2021   HGB 13.8 04/16/2021   HCT 40.4 04/16/2021   MCV 90.4 04/16/2021   PLT 124 (L) 04/16/2021   No results found for: "IRON", "TIBC", "FERRITIN"  Obesity Behavioral Intervention:   Approximately 15 minutes were spent on the discussion below.  ASK: We discussed the diagnosis of obesity with [name] today and Domnique agreed to give [place] permission to discuss obesity behavioral modification therapy  today.  ASSESS: Ewald has the diagnosis of obesity and his BMI today is 34.6. Zaeden is in the action stage of change.   ADVISE: Eliya was educated on the multiple health risks of obesity as well as the benefit of weight loss to improve his health. He was advised of the need for long term treatment and the importance of lifestyle modifications to improve his current health and to decrease his risk of future health problems.  AGREE: Multiple dietary modification options and treatment options were discussed and Ryler agreed to follow the recommendations documented in the above note.  ARRANGE: Jivan was educated on the importance of frequent visits to treat obesity as outlined per CMS and USPSTF guidelines and agreed to schedule his next follow up appointment today.  Attestation Statements:   Reviewed by clinician on day of visit: allergies, medications, problem list, medical history, surgical history, family history, social history, and previous encounter notes.  I, Davy Pique, RMA, am acting as Location manager for Mina Marble, NP.  I have reviewed the above documentation for accuracy and completeness, and I agree with the above. -  ***

## 2022-10-21 ENCOUNTER — Other Ambulatory Visit (INDEPENDENT_AMBULATORY_CARE_PROVIDER_SITE_OTHER): Payer: Self-pay | Admitting: Adult Health

## 2022-10-21 DIAGNOSIS — R7303 Prediabetes: Secondary | ICD-10-CM

## 2022-10-23 ENCOUNTER — Encounter (INDEPENDENT_AMBULATORY_CARE_PROVIDER_SITE_OTHER): Payer: Self-pay | Admitting: Adult Health

## 2022-10-23 ENCOUNTER — Ambulatory Visit (INDEPENDENT_AMBULATORY_CARE_PROVIDER_SITE_OTHER): Payer: Medicare Other | Admitting: Adult Health

## 2022-10-23 VITALS — BP 122/77 | HR 53 | Temp 97.6°F | Ht 71.0 in | Wt 247.0 lb

## 2022-10-23 DIAGNOSIS — E038 Other specified hypothyroidism: Secondary | ICD-10-CM | POA: Diagnosis not present

## 2022-10-23 DIAGNOSIS — Z6834 Body mass index (BMI) 34.0-34.9, adult: Secondary | ICD-10-CM | POA: Diagnosis not present

## 2022-10-23 DIAGNOSIS — R7303 Prediabetes: Secondary | ICD-10-CM | POA: Diagnosis not present

## 2022-10-23 DIAGNOSIS — E559 Vitamin D deficiency, unspecified: Secondary | ICD-10-CM

## 2022-10-23 DIAGNOSIS — E669 Obesity, unspecified: Secondary | ICD-10-CM

## 2022-10-23 MED ORDER — METFORMIN HCL 500 MG PO TABS: ORAL_TABLET | ORAL | 1 refills | Status: DC

## 2022-10-24 LAB — COMPREHENSIVE METABOLIC PANEL
ALT: 61 IU/L — ABNORMAL HIGH (ref 0–44)
AST: 51 IU/L — ABNORMAL HIGH (ref 0–40)
Albumin/Globulin Ratio: 1.6 (ref 1.2–2.2)
Albumin: 4.7 g/dL (ref 3.9–4.9)
Alkaline Phosphatase: 91 IU/L (ref 44–121)
BUN/Creatinine Ratio: 16 (ref 10–24)
BUN: 14 mg/dL (ref 8–27)
Bilirubin Total: 0.8 mg/dL (ref 0.0–1.2)
CO2: 23 mmol/L (ref 20–29)
Calcium: 9.9 mg/dL (ref 8.6–10.2)
Chloride: 103 mmol/L (ref 96–106)
Creatinine, Ser: 0.85 mg/dL (ref 0.76–1.27)
Globulin, Total: 2.9 g/dL (ref 1.5–4.5)
Glucose: 100 mg/dL — ABNORMAL HIGH (ref 70–99)
Potassium: 4.7 mmol/L (ref 3.5–5.2)
Sodium: 140 mmol/L (ref 134–144)
Total Protein: 7.6 g/dL (ref 6.0–8.5)
eGFR: 94 mL/min/{1.73_m2} (ref >59)

## 2022-10-24 LAB — TSH+FREE T4
Free T4: 1.34 ng/dL (ref 0.82–1.77)
TSH: 0.851 u[IU]/mL (ref 0.450–4.500)

## 2022-10-24 LAB — INSULIN, RANDOM: INSULIN: 19.4 u[IU]/mL (ref 2.6–24.9)

## 2022-10-24 LAB — HEMOGLOBIN A1C
Est. average glucose Bld gHb Est-mCnc: 117 mg/dL
Hgb A1c MFr Bld: 5.7 % — ABNORMAL HIGH (ref 4.8–5.6)

## 2022-10-24 LAB — VITAMIN D 25 HYDROXY (VIT D DEFICIENCY, FRACTURES): Vit D, 25-Hydroxy: 60.2 ng/mL (ref 30.0–100.0)

## 2022-10-27 NOTE — Progress Notes (Unsigned)
Chief Complaint:   OBESITY [name] is here to discuss his progress with his obesity treatment plan along with follow-up of his obesity related diagnoses. [name] is on the Category 3 Plan and states he is following his eating plan approximately 70% of the time. [name] states he is swimming and lifting weights 45-60 minutes 3-5 times per week.  Today's visit was #: 24 Starting weight: 266 lbs Starting date: 01/01/2021 Today's weight: 247 lbs Today's date: 10/23/2022 Total lbs lost to date: 19 lbs Total lbs lost since last in-office visit: 1 lb  Interim History: Bioimpedance results discussed with patient.  Muscle mass +1.2 lbs, adipose mass -2.6 lbs.  When he is eating off plan, he is eating out or getting take out.   Subjective:   1. Pre-diabetes A1c *** He is currently on  metformin 500 mg BID with meals, 6/6.  2. Vitamin D deficiency He is on OTC Vitamin D-3 1,000 =2 caps per day =2,000 IU daily.  Very tan complexion.   3. Other specified hypothyroidism 06/12/2022, PCP checked thyroid panel, TSH 0.14.  Dr Quay Burow decreased levothyroxine from 150-137 mcg ***  Assessment/Plan:   1. Pre-diabetes Refill - metFORMIN (GLUCOPHAGE) 500 MG tablet; TAKE 1 TABLET (500 MG TOTAL) BY MOUTH 2 (TWO) TIMES DAILY WITH A MEAL  Dispense: 60 tablet; Refill: 1  Check labs today.   - Comprehensive metabolic panel - Hemoglobin A1c - Insulin, random  2. Vitamin D deficiency Check labs today.   - VITAMIN D 25 Hydroxy (Vit-D Deficiency, Fractures)  3. Other specified hypothyroidism Check labs today and forward result to Dr Burn, PCP.   - TSH + free T4  4. Obesity with current BMI 34.4 Handout:  Eating out guide.   [name] is currently in the action stage of change. As such, his goal is to continue with weight loss efforts. He has agreed to the Category 3 Plan.   Exercise goals:  As is.   Behavioral modification strategies: increasing lean protein intake, decreasing simple  carbohydrates, decreasing eating out, meal planning and cooking strategies, keeping healthy foods in the home, and planning for success.  [name] has agreed to follow-up with our clinic in 4 weeks. He was informed of the importance of frequent follow-up visits to maximize his success with intensive lifestyle modifications for his multiple health conditions.   [name] was informed we would discuss his lab results at his next visit unless there is a critical issue that needs to be addressed sooner. [name] agreed to keep his next visit at the agreed upon time to discuss these results.  Objective:   Blood pressure 122/77, pulse (!) 53, temperature 97.6 F (36.4 C), height '5\' 11"'$  (1.803 m), weight 247 lb (112 kg), SpO2 97 %. Body mass index is 34.45 kg/m.  General: Cooperative, alert, well developed, in no acute distress. HEENT: Conjunctivae and lids unremarkable. Cardiovascular: Regular rhythm.  Lungs: Normal work of breathing. Neurologic: No focal deficits.   Lab Results  Component Value Date   CREATININE 0.85 10/23/2022   BUN 14 10/23/2022   NA 140 10/23/2022   K 4.7 10/23/2022   CL 103 10/23/2022   CO2 23 10/23/2022   Lab Results  Component Value Date   ALT 61 (H) 10/23/2022   AST 51 (H) 10/23/2022   ALKPHOS 91 10/23/2022   BILITOT 0.8 10/23/2022   Lab Results  Component Value Date   HGBA1C 5.7 (H) 10/23/2022   HGBA1C 5.8 (H) 06/09/2022   HGBA1C 5.5 01/09/2022  HGBA1C 5.8 (H) 08/19/2021   HGBA1C 5.3 03/12/2021   Lab Results  Component Value Date   INSULIN 19.4 10/23/2022   INSULIN 15.2 06/09/2022   INSULIN 8.4 01/09/2022   INSULIN 22.7 08/19/2021   INSULIN 19.1 04/22/2021   Lab Results  Component Value Date   TSH 0.851 10/23/2022   Lab Results  Component Value Date   CHOL 117 05/27/2022   HDL 42 05/27/2022   LDLCALC 62 05/27/2022   LDLDIRECT 153.1 06/09/2011   TRIG 59 05/27/2022   CHOLHDL 2.8 05/27/2022   Lab Results  Component Value Date   VD25OH  60.2 10/23/2022   VD25OH 80.1 06/09/2022   VD25OH 42.5 01/09/2022   Lab Results  Component Value Date   WBC 7.0 04/16/2021   HGB 13.8 04/16/2021   HCT 40.4 04/16/2021   MCV 90.4 04/16/2021   PLT 124 (L) 04/16/2021   No results found for: "IRON", "TIBC", "FERRITIN"  Attestation Statements:   Reviewed by clinician on day of visit: allergies, medications, problem list, medical history, surgical history, family history, social history, and previous encounter notes.  I, Davy Pique, RMA, am acting as Location manager for Mina Marble, NP.  I have reviewed the above documentation for accuracy and completeness, and I agree with the above. -  ***

## 2022-10-28 DIAGNOSIS — Z23 Encounter for immunization: Secondary | ICD-10-CM | POA: Diagnosis not present

## 2022-10-31 ENCOUNTER — Ambulatory Visit (INDEPENDENT_AMBULATORY_CARE_PROVIDER_SITE_OTHER): Payer: Medicare Other | Admitting: Podiatry

## 2022-10-31 DIAGNOSIS — L600 Ingrowing nail: Secondary | ICD-10-CM | POA: Diagnosis not present

## 2022-10-31 NOTE — Progress Notes (Signed)
Subjective:  Patient ID: [name], male    DOB: 15-Feb-1951,  MRN: [id number]  Chief Complaint  Patient presents with   Ingrown Toenail    Rt hallux     71 y.o. male presents with the above complaint.  Patient presents with right hallux lateral border ingrown painful to touch is progressive gotten worse worse with ambulation hurts with pressure has not seen anyone as prior to seeing me he would like to have removed pain scale is 5 out of 10 dull achy in nature.  Bothers him in tight shoes.   Review of Systems: Negative except as noted in the HPI. Denies N/V/F/Ch.  Past Medical History:  Diagnosis Date   Back pain    BPH (benign prostatic hypertrophy)    Chest pain    COLONIC POLYPS, HX OF 2007   clear colo w/o polyps 04/2015: 24yrfollow up   Constipation    Coronary artery disease    Decreased mobility    due to BL knee and hip replacement   Fatigue    GERD (gastroesophageal reflux disease)    Heartburn    History of kidney stones    passed   Hyperlipidemia    HYPERTENSION    HYPOTHYROIDISM    Joint pain    [place] 12[place]   Inferior STEMI   OA (osteoarthritis)    Knees   OBESITY    OSA treated with BiPAP    PAF (paroxysmal atrial fibrillation) (HCC)    Pre-diabetes    Shortness of breath on exertion    Umbilical hernia    Ventral hernia     Current Outpatient Medications:    aspirin 81 MG EC tablet, Take 81 mg by mouth daily. Swallow whole., Disp: , Rfl:    atorvastatin (LIPITOR) 80 MG tablet, TAKE 1 TABLET BY MOUTH DAILY AT 6 PM., Disp: 90 tablet, Rfl: 3   clopidogrel (PLAVIX) 75 MG tablet, TAKE 1 TABLET BY MOUTH EVERY DAY, Disp: 90 tablet, Rfl: 0   levothyroxine (SYNTHROID) 137 MCG tablet, Take 1 tablet (137 mcg total) by mouth daily before breakfast., Disp: 90 tablet, Rfl: 3   metFORMIN (GLUCOPHAGE) 500 MG tablet, TAKE 1 TABLET (500 MG TOTAL) BY MOUTH 2 (TWO) TIMES DAILY WITH A MEAL, Disp: 60 tablet, Rfl: 1   metoprolol succinate  (TOPROL-XL) 25 MG 24 hr tablet, TAKE 1 TABLET (25 MG TOTAL) BY MOUTH DAILY., Disp: 90 tablet, Rfl: 3   nitroGLYCERIN (NITROSTAT) 0.4 MG SL tablet, PLACE 1 TABLET (0.4 MG TOTAL) UNDER THE TONGUE EVERY 5 (FIVE) MINUTES AS NEEDED FOR CHEST PAIN., Disp: 25 tablet, Rfl: 3   tamsulosin (FLOMAX) 0.4 MG CAPS capsule, Take 0.4 mg by mouth daily., Disp: , Rfl:    telmisartan (MICARDIS) 80 MG tablet, TAKE 1 TABLET BY MOUTH EVERY DAY, Disp: 90 tablet, Rfl: 1  Social History   Tobacco Use  Smoking Status Never  Smokeless Tobacco Never    No Known Allergies Objective:  There were no vitals filed for this visit. There is no height or weight on file to calculate BMI. Constitutional Well developed. Well nourished.  Vascular Dorsalis pedis pulses palpable bilaterally. Posterior tibial pulses palpable bilaterally. Capillary refill normal to all digits.  No cyanosis or clubbing noted. Pedal hair growth normal.  Neurologic Normal speech. Oriented to person, place, and time. Epicritic sensation to light touch grossly present bilaterally.  Dermatologic Painful ingrowing nail at lateral nail borders of the hallux nail right. No other open wounds. No skin  lesions.  Orthopedic: Normal joint ROM without pain or crepitus bilaterally. No visible deformities. No bony tenderness.   Radiographs: None Assessment:   1. Ingrown toenail of right foot    Plan:  Patient was evaluated and treated and all questions answered.  Ingrown Nail, right -Patient elects to proceed with minor surgery to remove ingrown toenail removal today. Consent reviewed and signed by patient. -Ingrown nail excised. See procedure note. -Educated on post-procedure care including soaking. Written instructions provided and reviewed. -Patient to follow up in 2 weeks for nail check.  Procedure: Excision of Ingrown Toenail Location: Right 1st toe lateral nail borders. Anesthesia: Lidocaine 1% plain; 1.5 mL and Marcaine 0.5% plain; 1.5  mL, digital block. Skin Prep: Betadine. Dressing: Silvadene; telfa; dry, sterile, compression dressing. Technique: Following skin prep, the toe was exsanguinated and a tourniquet was secured at the base of the toe. The affected nail border was freed, split with a nail splitter, and excised. Chemical matrixectomy was then performed with phenol and irrigated out with alcohol. The tourniquet was then removed and sterile dressing applied. Disposition: Patient tolerated procedure well. Patient to return in 2 weeks for follow-up.   No follow-ups on file.

## 2022-11-10 DIAGNOSIS — H5213 Myopia, bilateral: Secondary | ICD-10-CM | POA: Diagnosis not present

## 2022-11-10 DIAGNOSIS — H43813 Vitreous degeneration, bilateral: Secondary | ICD-10-CM | POA: Diagnosis not present

## 2022-11-11 ENCOUNTER — Telehealth: Payer: Self-pay | Admitting: *Deleted

## 2022-11-11 ENCOUNTER — Telehealth: Payer: Self-pay

## 2022-11-11 NOTE — Telephone Encounter (Signed)
...     Pre-operative Risk Assessment    Patient Name: [name]  DOB: [birth year] MRN: [id number]      Request for Surgical Clearance    Procedure:   lumbar esi  Date of Surgery:  Clearance 11/25/22                                 Surgeon:  Myer Haff Surgeon's Group or Practice Name:  [facility] Phone number:  [phone] Fax number:  [phone]   Type of Clearance Requested:   - Medical  - Pharmacy:  Hold Aspirin and Clopidogrel (Plavix) request to hold plavix for seven days   Type of Anesthesia:  Not Indicated   Additional requests/questions:    [name]   11/11/2022, 9:29 AM

## 2022-11-11 NOTE — Telephone Encounter (Signed)
Pt returning nurses call regarding Pre Op appt. Please advise 

## 2022-11-11 NOTE — Telephone Encounter (Signed)
   Name: [name]  DOB: June 09, 1951  MRN: [id number]  Primary Cardiologist: Fransico Him, MD  Chart reviewed as part of pre-operative protocol coverage. Because of [name] past medical history and time since last visit, he will require a follow-up telephone visit in order to better assess preoperative cardiovascular risk.  Pre-op covering staff: - Please schedule appointment and call patient to inform them. If patient already had an upcoming appointment within acceptable timeframe, please add "pre-op clearance" to the appointment notes so provider is aware. - Please contact requesting surgeon's office via preferred method (i.e, phone, fax) to inform them of need for appointment prior to surgery.  This message will also be routed to pharmacy pool and/or Dr Radford Pax for input on holding Plavix x 7 days as requested below so that this information is available to the clearing provider at time of patient's appointment.   Elgie Collard, PA-C  11/11/2022, 11:17 AM

## 2022-11-11 NOTE — Telephone Encounter (Signed)
Left message to call back to set up tele pre op appt.  

## 2022-11-11 NOTE — Telephone Encounter (Signed)
Pt set for tele pre op appt 11/13/22 @ 9:40. Med rec and consent are done.     Patient Consent for Virtual Visit        [name] has provided verbal consent on 11/11/2022 for a virtual visit (video or telephone).   CONSENT FOR VIRTUAL VISIT FOR:  [name]  By participating in this virtual visit I agree to the following:  I hereby voluntarily request, consent and authorize [facility] and its employed or contracted physicians, physician assistants, nurse practitioners or other licensed health care professionals (the Practitioner), to provide me with telemedicine health care services (the "Services") as deemed necessary by the treating Practitioner. I acknowledge and consent to receive the Services by the Practitioner via telemedicine. I understand that the telemedicine visit will involve communicating with the Practitioner through live audiovisual communication technology and the disclosure of certain medical information by electronic transmission. I acknowledge that I have been given the opportunity to request an in-person assessment or other available alternative prior to the telemedicine visit and am voluntarily participating in the telemedicine visit.  I understand that I have the right to withhold or withdraw my consent to the use of telemedicine in the course of my care at any time, without affecting my right to future care or treatment, and that the Practitioner or I may terminate the telemedicine visit at any time. I understand that I have the right to inspect all information obtained and/or recorded in the course of the telemedicine visit and may receive copies of available information for a reasonable fee.  I understand that some of the potential risks of receiving the Services via telemedicine include:  Delay or interruption in medical evaluation due to technological equipment failure or disruption; Information transmitted may not be sufficient (e.g. poor resolution  of images) to allow for appropriate medical decision making by the Practitioner; and/or  In rare instances, security protocols could fail, causing a breach of personal health information.  Furthermore, I acknowledge that it is my responsibility to provide information about my medical history, conditions and care that is complete and accurate to the best of my ability. I acknowledge that Practitioner's advice, recommendations, and/or decision may be based on factors not within their control, such as incomplete or inaccurate data provided by me or distortions of diagnostic images or specimens that may result from electronic transmissions. I understand that the practice of medicine is not an exact science and that Practitioner makes no warranties or guarantees regarding treatment outcomes. I acknowledge that a copy of this consent can be made available to me via my patient portal ([facility]), or I can request a printed copy by calling the office of [facility].    I understand that my insurance will be billed for this visit.   I have read or had this consent read to me. I understand the contents of this consent, which adequately explains the benefits and risks of the Services being provided via telemedicine.  I have been provided ample opportunity to ask questions regarding this consent and the Services and have had my questions answered to my satisfaction. I give my informed consent for the services to be provided through the use of telemedicine in my medical care

## 2022-11-11 NOTE — Telephone Encounter (Signed)
Pt set for tele pre op appt 11/13/22 @ 9:40. Med rec and consent are done.

## 2022-11-13 ENCOUNTER — Ambulatory Visit: Payer: Medicare Other | Attending: Cardiology | Admitting: Nurse Practitioner

## 2022-11-13 DIAGNOSIS — Z01810 Encounter for preprocedural cardiovascular examination: Secondary | ICD-10-CM | POA: Diagnosis not present

## 2022-11-13 NOTE — Progress Notes (Signed)
Virtual Visit via Telephone Note   Because of [name] co-morbid illnesses, he is at least at moderate risk for complications without adequate follow up.  This format is felt to be most appropriate for this patient at this time.  The patient did not have access to video technology/had technical difficulties with video requiring transitioning to audio format only (telephone).  All issues noted in this document were discussed and addressed.  No physical exam could be performed with this format.  Please refer to the patient's chart for his consent to telehealth for [name].  Evaluation Performed:  Preoperative cardiovascular risk assessment _____________   Date:  11/13/2022   Patient ID:  [name], DOB June 12, 1951, MRN [id number] Patient Location:  Home Provider location:   Office  Primary Care Provider:  Binnie Rail, [name] Primary Cardiologist:  [name] Him, [name]  Chief Complaint / Patient Profile   71 y.o. y/o male with a h/o CAD s/p DES-PDA, CTO-Circumflex, hypertension, hyperlipidemia, and OSA who is pending lumbar ESI on 11/25/2022 with Dr. Nelva [name] of [name] and presents today for telephonic preoperative cardiovascular risk assessment.  Past Medical History    Past Medical History:  Diagnosis Date   Back pain    BPH (benign prostatic hypertrophy)    Chest pain    COLONIC POLYPS, HX OF 2007   clear colo w/o polyps 04/2015: 16yrfollow up   Constipation    Coronary artery disease    Decreased mobility    due to BL knee and hip replacement   Fatigue    GERD (gastroesophageal reflux disease)    Heartburn    History of kidney stones    passed   Hyperlipidemia    HYPERTENSION    HYPOTHYROIDISM    Joint pain    [place] [place]   Inferior STEMI   OA (osteoarthritis)    Knees   OBESITY    OSA treated with BiPAP    PAF (paroxysmal atrial fibrillation) (HCC)    Pre-diabetes    Shortness of breath on exertion    Umbilical  hernia    Ventral hernia    Past Surgical History:  Procedure Laterality Date   arthroscopic knee surgery     (R) 2003 & (L) 2010   CARDIAC CATHETERIZATION     COLONOSCOPY  04/2015   no polyps ([name])   CORONARY/GRAFT ACUTE MI REVASCULARIZATION N/A 12/30/2018   Procedure: Coronary/Graft Acute MI Revascularization;  Surgeon: [name];  [facility];  Service: Cardiovascular;  Laterality: N/A;   HIP CLOSED REDUCTION Right 03/12/2021   Procedure: CLOSED REDUCTION HIP;  Surgeon: BMelina Schools [name];  Location: [facility];  Service: Orthopedics;  Laterality: Right;   LAMINECTOMY  1991   LEFT HEART CATH AND CORONARY ANGIOGRAPHY N/A 12/30/2018   Procedure: LEFT HEART CATH AND CORONARY ANGIOGRAPHY;  Surgeon: [name];  [facility];  Service: Cardiovascular;  Laterality: N/A;   TONSILLECTOMY  1990   w/ adenoids and uvula   TOTAL HIP ARTHROPLASTY Right 01/2011   [place]   TOTAL HIP ARTHROPLASTY Left 04/16/2016   Procedure: TOTAL HIP ARTHROPLASTY ANTERIOR APPROACH;  Surgeon: [name] Arabian [name];  Location: [facility];  Service: Orthopedics;  Laterality: Left;   TOTAL HIP REVISION Right 01/04/2020   Procedure: Right hip bearing surface;  Surgeon: [name] Arabian [name];  Location: [facility];  Service: Orthopedics;  Laterality: Right;  124m   TOTAL HIP REVISION Right 04/15/2021  Procedure: Right hip bearing surface revision;  Surgeon: [name];  Location: [facility];  Service: Orthopedics;  Laterality: Right;  66mn   TOTAL KNEE ARTHROPLASTY  12/27/2012   Procedure: TOTAL KNEE BILATERAL;  Surgeon: FGearlean Alf [name];  Location: [facility];  Service: Orthopedics;  Laterality: Bilateral;    Allergies  No Known Allergies  History of Present Illness    [name] a 71y.o. male who presents via audio/video conferencing for a telehealth visit today.  Pt was last seen in cardiology clinic on 05/27/2022 by Dr. TRadford Pax  At that time [name] TURNBAUGHwas doing well.  The patient is now pending procedure as outlined above. Since his last visit, he has done well from a cardiac standpoint.  He is very active and swims for an hour each day.  He denies chest pain, palpitations, dyspnea, pnd, orthopnea, n, v, dizziness, syncope, edema, weight gain, or early satiety. All other systems reviewed and are otherwise negative except as noted above.    Home Medications    Prior to Admission medications   Medication Sig Start Date End Date Taking? Authorizing Provider  aspirin 81 MG EC tablet Take 81 mg by mouth daily. Swallow whole.    Provider, Historical, [name]  atorvastatin (LIPITOR) 80 MG tablet TAKE 1 TABLET BY MOUTH DAILY AT 6 PM. 05/05/22   [name], TEber Hong [name]  clopidogrel (PLAVIX) 75 MG tablet TAKE 1 TABLET BY MOUTH EVERY DAY 05/20/22   TSueanne Margarita [name]  levothyroxine (SYNTHROID) 137 MCG tablet Take 1 tablet (137 mcg total) by mouth daily before breakfast. 06/12/22   Burns, [name] Lick [name]  metFORMIN (GLUCOPHAGE) 500 MG tablet TAKE 1 TABLET (500 MG TOTAL) BY MOUTH 2 (TWO) TIMES DAILY WITH A MEAL 10/23/22   [name], KValetta FullerD, NP  metoprolol succinate (TOPROL-XL) 25 MG 24 hr tablet TAKE 1 TABLET (25 MG TOTAL) BY MOUTH DAILY. 06/24/22   TSueanne Margarita [name]  nitroGLYCERIN (NITROSTAT) 0.4 MG SL tablet PLACE 1 TABLET (0.4 MG TOTAL) UNDER THE TONGUE EVERY 5 (FIVE) MINUTES AS NEEDED FOR CHEST PAIN. 11/19/20   TSueanne Margarita [name]  tamsulosin (FLOMAX) 0.4 MG CAPS capsule Take 0.4 mg by mouth daily. 03/21/20   Provider, Historical, [name]  telmisartan (MICARDIS) 80 MG tablet TAKE 1 TABLET BY MOUTH EVERY DAY 08/19/22   TSueanne Margarita [name]    Physical Exam    Vital Signs:  [name] WIGHTdoes not have vital signs available for review today.  Given telephonic nature of communication, physical exam is limited. AAOx3. NAD. Normal affect.  Speech and respirations are unlabored.  Accessory Clinical Findings    None  Assessment & Plan    1.  Preoperative Cardiovascular Risk  Assessment:  According to the Revised Cardiac Risk Index (RCRI), his Perioperative Risk of Major Cardiac Event is (%): 0.9. His Functional Capacity in METs is: 7.01 according to the Duke Activity Status Index (DASI).Therefore, based on ACC/AHA guidelines, patient would be at acceptable risk for the planned procedure without further cardiovascular testing.   The patient was advised that if he develops new symptoms prior to surgery to contact our office to arrange for a follow-up visit, and he verbalized understanding.  Per office protocol, patient may hold Aspirin and Plavix for 5-7 days prior to procedure. Please resume Aspiring and Plavix as soon as possible postprocedure, at the discretion of the surgeon.   A copy of this note will be routed to requesting surgeon.  Time:  Today, I have spent 5 minutes with the patient with telehealth technology discussing medical history, symptoms, and management plan.     Lenna Sciara, NP  11/13/2022, 9:56 AM

## 2022-11-24 ENCOUNTER — Ambulatory Visit (INDEPENDENT_AMBULATORY_CARE_PROVIDER_SITE_OTHER): Payer: Medicare Other | Admitting: Family Medicine

## 2022-11-24 ENCOUNTER — Encounter (INDEPENDENT_AMBULATORY_CARE_PROVIDER_SITE_OTHER): Payer: Self-pay | Admitting: Family Medicine

## 2022-11-24 VITALS — BP 125/75 | HR 62 | Temp 97.6°F | Ht 71.0 in | Wt 250.0 lb

## 2022-11-24 DIAGNOSIS — Z6834 Body mass index (BMI) 34.0-34.9, adult: Secondary | ICD-10-CM

## 2022-11-24 DIAGNOSIS — E669 Obesity, unspecified: Secondary | ICD-10-CM

## 2022-11-24 DIAGNOSIS — R7303 Prediabetes: Secondary | ICD-10-CM | POA: Diagnosis not present

## 2022-11-24 DIAGNOSIS — I10 Essential (primary) hypertension: Secondary | ICD-10-CM | POA: Diagnosis not present

## 2022-11-24 MED ORDER — METFORMIN HCL 500 MG PO TABS: 1000.0000 mg | ORAL_TABLET | Freq: Every day | ORAL | 0 refills | Status: DC

## 2022-11-25 ENCOUNTER — Telehealth: Payer: Medicare Other

## 2022-11-25 DIAGNOSIS — M5416 Radiculopathy, lumbar region: Secondary | ICD-10-CM | POA: Diagnosis not present

## 2022-11-28 ENCOUNTER — Ambulatory Visit (INDEPENDENT_AMBULATORY_CARE_PROVIDER_SITE_OTHER): Payer: Medicare Other

## 2022-11-28 ENCOUNTER — Encounter: Payer: Self-pay | Admitting: Internal Medicine

## 2022-11-28 ENCOUNTER — Telehealth: Payer: Self-pay

## 2022-11-28 ENCOUNTER — Ambulatory Visit (INDEPENDENT_AMBULATORY_CARE_PROVIDER_SITE_OTHER): Payer: Medicare Other | Admitting: Internal Medicine

## 2022-11-28 VITALS — BP 120/72 | HR 64 | Temp 98.1°F | Ht 71.0 in | Wt 261.0 lb

## 2022-11-28 DIAGNOSIS — I2510 Atherosclerotic heart disease of native coronary artery without angina pectoris: Secondary | ICD-10-CM

## 2022-11-28 DIAGNOSIS — Z9861 Coronary angioplasty status: Secondary | ICD-10-CM

## 2022-11-28 DIAGNOSIS — R051 Acute cough: Secondary | ICD-10-CM | POA: Diagnosis not present

## 2022-11-28 DIAGNOSIS — J189 Pneumonia, unspecified organism: Secondary | ICD-10-CM

## 2022-11-28 DIAGNOSIS — R059 Cough, unspecified: Secondary | ICD-10-CM | POA: Diagnosis not present

## 2022-11-28 MED ORDER — HYDROCODONE BIT-HOMATROP MBR 5-1.5 MG/5ML PO SOLN: 5.0000 mL | Freq: Three times a day (TID) | ORAL | 0 refills | Status: AC | PRN

## 2022-11-28 MED ORDER — AMOXICILLIN-POT CLAVULANATE 875-125 MG PO TABS: 1.0000 | ORAL_TABLET | Freq: Two times a day (BID) | ORAL | 0 refills | Status: AC

## 2022-11-28 NOTE — Telephone Encounter (Signed)
Dr. Gery Pray patient to be seen with him today.   Patient contacted.

## 2022-11-28 NOTE — Progress Notes (Signed)
Subjective:  Patient ID: [name], male    DOB: [birth year]  Age: 71 y.o. MRN: [id number]  CC: Cough   HPI [name] presents for f/up -  He complains of a 5-day history of cough that is productive of a scant amount of phlegm.  His at home test for COVID was negative.  He has been vaccinated against COVID, flu, and RSV.  He denies fever, chills, night sweats, or chest pain.  Outpatient Medications Prior to Visit  Medication Sig Dispense Refill   aspirin 81 MG EC tablet Take 81 mg by mouth daily. Swallow whole.     atorvastatin (LIPITOR) 80 MG tablet TAKE 1 TABLET BY MOUTH DAILY AT 6 PM. 90 tablet 3   clopidogrel (PLAVIX) 75 MG tablet TAKE 1 TABLET BY MOUTH EVERY DAY 90 tablet 0   levothyroxine (SYNTHROID) 137 MCG tablet Take 1 tablet (137 mcg total) by mouth daily before breakfast. 90 tablet 3   metFORMIN (GLUCOPHAGE) 500 MG tablet Take 2 tablets (1,000 mg total) by mouth daily with breakfast. TAKE 1 TABLET (500 MG TOTAL) BY MOUTH 2 (TWO) TIMES DAILY WITH A MEAL 60 tablet 0   metoprolol succinate (TOPROL-XL) 25 MG 24 hr tablet TAKE 1 TABLET (25 MG TOTAL) BY MOUTH DAILY. 90 tablet 3   nitroGLYCERIN (NITROSTAT) 0.4 MG SL tablet PLACE 1 TABLET (0.4 MG TOTAL) UNDER THE TONGUE EVERY 5 (FIVE) MINUTES AS NEEDED FOR CHEST PAIN. 25 tablet 3   tamsulosin (FLOMAX) 0.4 MG CAPS capsule Take 0.4 mg by mouth daily.     telmisartan (MICARDIS) 80 MG tablet TAKE 1 TABLET BY MOUTH EVERY DAY 90 tablet 1   No facility-administered medications prior to visit.    ROS Review of Systems  Constitutional: Negative.  Negative for chills, diaphoresis, fatigue and fever.  HENT: Negative.    Eyes: Negative.   Respiratory:  Positive for cough and shortness of breath. Negative for chest tightness and wheezing.   Cardiovascular:  Negative for chest pain, palpitations and leg swelling.  Gastrointestinal:  Negative for abdominal pain, diarrhea, nausea and vomiting.  Endocrine: Negative.   Genitourinary:  Negative.  Negative for difficulty urinating.  Musculoskeletal: Negative.  Negative for arthralgias and myalgias.  Skin: Negative.   Neurological: Negative.  Negative for dizziness and weakness.  Hematological:  Negative for adenopathy. Does not bruise/bleed easily.  Psychiatric/Behavioral: Negative.      Objective:  BP 120/72 (BP Location: Right Arm, Patient Position: Sitting, Cuff Size: Normal)   Pulse 64   Temp 98.1 F (36.7 C) (Oral)   Ht '5\' 11"'$  (1.803 m)   Wt 261 lb (118.4 kg)   SpO2 97%   BMI 36.40 kg/m   BP Readings from Last 3 Encounters:  11/28/22 120/72  11/24/22 125/75  10/23/22 122/77    Wt Readings from Last 3 Encounters:  11/28/22 261 lb (118.4 kg)  11/24/22 250 lb (113.4 kg)  10/23/22 247 lb (112 kg)    Physical Exam Vitals reviewed.  Constitutional:      General: He is not in acute distress.    Appearance: He is not ill-appearing, toxic-appearing or diaphoretic.  HENT:     Mouth/Throat:     Mouth: Mucous membranes are moist.  Eyes:     General: No scleral icterus.    Conjunctiva/sclera: Conjunctivae normal.  Cardiovascular:     Rate and Rhythm: Normal rate and regular rhythm.     Heart sounds: No murmur heard.    No gallop.  Pulmonary:  Effort: Pulmonary effort is normal.     Breath sounds: Examination of the right-upper field reveals rhonchi. Examination of the right-middle field reveals rhonchi. Examination of the right-lower field reveals rhonchi. Rhonchi present. No decreased breath sounds, wheezing or rales.  Abdominal:     General: Abdomen is flat.     Palpations: There is no mass.     Tenderness: There is no abdominal tenderness. There is no guarding.     Hernia: No hernia is present.  Musculoskeletal:        General: Normal range of motion.     Cervical back: Neck supple.     Right lower leg: No edema.     Left lower leg: No edema.  Lymphadenopathy:     Cervical: No cervical adenopathy.  Skin:    General: Skin is warm and dry.   Neurological:     General: No focal deficit present.     Mental Status: He is alert and oriented to person, place, and time.  Psychiatric:        Mood and Affect: Mood normal.        Behavior: Behavior normal.     Lab Results  Component Value Date   WBC 7.0 04/16/2021   HGB 13.8 04/16/2021   HCT 40.4 04/16/2021   PLT 124 (L) 04/16/2021   GLUCOSE 100 (H) 10/23/2022   CHOL 117 05/27/2022   TRIG 59 05/27/2022   HDL 42 05/27/2022   LDLDIRECT 153.1 06/09/2011   LDLCALC 62 05/27/2022   ALT 61 (H) 10/23/2022   AST 51 (H) 10/23/2022   NA 140 10/23/2022   K 4.7 10/23/2022   CL 103 10/23/2022   CREATININE 0.85 10/23/2022   BUN 14 10/23/2022   CO2 23 10/23/2022   TSH 0.851 10/23/2022   PSA 5.72 (H) 12/15/2011   INR 1.1 04/05/2021   HGBA1C 5.7 (H) 10/23/2022    DG Pelvis Portable  Result Date: 04/15/2021 CLINICAL DATA:  Post RIGHT hip prosthesis surface revision EXAM: PORTABLE PELVIS 1-2 VIEWS COMPARISON:  Portable exam 1617 hours compared to 01/04/2020 FINDINGS: BILATERAL hip prostheses again identified. No fracture, dislocation, or bone destruction. Tip of RIGHT femoral component not imaged. IMPRESSION: BILATERAL hip prostheses. No acute abnormalities. Electronically Signed   By: Lavonia Dana M.D.   On: 04/15/2021 16:40    DG Chest 2 View  Result Date: 11/28/2022 CLINICAL DATA:  cough for 5 days EXAM: CHEST - 2 VIEW COMPARISON:  12/30/2018 chest radiograph. FINDINGS: Stable cardiomediastinal silhouette with normal heart size. No pneumothorax. No pleural effusion. New mild patchy right mid to upper lung opacity. Clear left lung. IMPRESSION: New mild patchy right mid to upper lung opacity, suspicious for pneumonia. Recommend follow-up chest radiographs to resolution. Electronically Signed   By: Ilona Sorrel M.D.   On: 11/28/2022 09:06     Assessment & Plan:   [name] was seen today for cough.  Diagnoses and all orders for this visit:  Acute cough- Chest x-ray is positive for  pneumonia.  Will treat with Augmentin and will offer a cough suppressant. -     DG Chest 2 View; Future  Pneumonia of right upper lobe due to infectious organism -     amoxicillin-clavulanate (AUGMENTIN) 875-125 MG tablet; Take 1 tablet by mouth 2 (two) times daily for 10 days. -     HYDROcodone bit-homatropine (HYCODAN) 5-1.5 MG/5ML syrup; Take 5 mLs by mouth every 8 (eight) hours as needed for up to 8 days for cough.   I am  having [name] "[name]" start on amoxicillin-clavulanate and HYDROcodone bit-homatropine. I am also having him maintain his tamsulosin, nitroGLYCERIN, aspirin EC, atorvastatin, clopidogrel, levothyroxine, metoprolol succinate, telmisartan, and metFORMIN.  Meds ordered this encounter  Medications   amoxicillin-clavulanate (AUGMENTIN) 875-125 MG tablet    Sig: Take 1 tablet by mouth 2 (two) times daily for 10 days.    Dispense:  20 tablet    Refill:  0   HYDROcodone bit-homatropine (HYCODAN) 5-1.5 MG/5ML syrup    Sig: Take 5 mLs by mouth every 8 (eight) hours as needed for up to 8 days for cough.    Dispense:  120 mL    Refill:  0     Follow-up: Return in about 3 weeks (around 12/19/2022).  Scarlette Calico, MD

## 2022-11-28 NOTE — Patient Instructions (Signed)
Community-Acquired Pneumonia, Adult Pneumonia is a lung infection that causes inflammation and the buildup of mucus and fluids in the lungs. This may cause coughing and difficulty breathing. Community-acquired pneumonia is pneumonia that develops in people who are not, and have not recently been, in a hospital or other health care facility. Usually, pneumonia develops as a result of an illness that is caused by a virus, such as the common cold and the flu (influenza). It can also be caused by bacteria or fungi. While the common cold and influenza can pass from person to person (are contagious), pneumonia itself is not considered contagious. What are the causes? This condition may be caused by: Viruses. Bacteria. Fungi. What increases the risk? The following factors may make you more likely to develop this condition: Being over age 65 or having certain medical conditions, such as: A long-term (chronic) disease, such as: chronic obstructive pulmonary disease (COPD), asthma, heart failure, diabetes, or kidney disease. A condition that increases the risk of breathing in (aspirating) mucus and other fluids from your mouth and nose. A weakened body defense system (immune system). Having had your spleen removed (splenectomy). The spleen is the organ that helps fight germs and infections. Not cleaning your teeth and gums well (poor dental hygiene). Using tobacco products. Traveling to places where germs that cause pneumonia are present or being near certain animals or animal habitats that could have germs that cause pneumonia. What are the signs or symptoms? Symptoms of this condition include: A dry cough or a wet (productive) cough. A fever, sweating, or chills. Chest pain, especially when breathing deeply or coughing. Fast breathing, difficulty breathing, or shortness of breath. Tiredness (fatigue) and muscle aches. How is this diagnosed? This condition may be diagnosed based on your medical  history or a physical exam. You may also have tests, including: Imaging, such as a chest X-ray or lung ultrasound. Tests of: The level of oxygen and other gases in your blood. Mucus from your lungs (sputum). Fluid around your lungs (pleural fluid). Your urine. How is this treated? Treatment for this condition depends on many factors, such as the cause of your pneumonia, your medicines, and other medical conditions that you have. For most adults, pneumonia may be treated at home. In some cases, treatment must happen in a hospital and may include: Medicines that are given by mouth (orally) or through an IV, including: Antibiotic medicines, if bacteria caused the pneumonia. Medicines that kill viruses (antiviral medicines), if a virus caused the pneumonia. Oxygen therapy. Severe pneumonia, although rare, may require the following treatments: Mechanical ventilation.This procedure uses a machine to help you breathe if you cannot breathe well on your own or maintain a safe level of blood oxygen. Thoracentesis. This procedure removes any buildup of pleural fluid to help with breathing. Follow these instructions at home:  Medicines Take over-the-counter and prescription medicines only as told by your health care provider. Take cough medicine only if you have trouble sleeping. Cough medicine can prevent your body from removing mucus from your lungs. If you were prescribed antibiotics, take them as told by your health care provider. Do not stop taking the antibiotic even if you start to feel better. Lifestyle     Do not drink alcohol. Do not use any products that contain nicotine or tobacco. These products include cigarettes, chewing tobacco, and vaping devices, such as e-cigarettes. If you need help quitting, ask your health care provider. Eat a healthy diet. This includes plenty of vegetables, fruits, whole grains, low-fat   dairy products, and lean protein. General instructions Rest a lot and  get at least 8 hours of sleep each night. Sleep in a partly upright position at night. Place a few pillows under your head or sleep in a reclining chair. Return to your normal activities as told by your health care provider. Ask your health care provider what activities are safe for you. Drink enough fluid to keep your urine pale yellow. This helps to thin the mucus in your lungs. If your throat is sore, gargle with a mixture of salt and water 3-4 times a day or as needed. To make salt water, completely dissolve -1 tsp (3-6 g) of salt in 1 cup (237 mL) of warm water. Keep all follow-up visits. How is this prevented? You can lower your risk of developing community-acquired pneumonia by: Getting the pneumonia vaccine. There are different types and schedules of pneumonia vaccines. Ask your health care provider which option is best for you. Consider getting the pneumonia vaccine if: You are older than 69 years of age. You are 19-65 years of age and are receiving cancer treatment, have chronic lung disease, or have other medical conditions that affect your immune system. Ask your health care provider if this applies to you. Getting your influenza vaccine every year. Ask your health care provider which type of vaccine is best for you. Getting regular dental checkups. Washing your hands often with soap and water for at least 20 seconds. If soap and water are not available, use hand sanitizer. Contact a health care provider if: You have a fever. You have trouble sleeping because you cannot control your cough with cough medicine. Get help right away if: Your shortness of breath becomes worse. Your chest pain increases. Your sickness becomes worse, especially if you are an older adult or have a weak immune system. You cough up blood. These symptoms may be an emergency. Get help right away. Call 911. Do not wait to see if the symptoms will go away. Do not drive yourself to the  hospital. Summary Pneumonia is an infection of the lungs. Community-acquired pneumonia develops in people who have not been in the hospital. It can be caused by bacteria, viruses, or fungi. This condition may be treated with antibiotics or antiviral medicines. Severe pneumonia may require a hospital stay and treatment to help with breathing. This information is not intended to replace advice given to you by your health care provider. Make sure you discuss any questions you have with your health care provider. Document Revised: 02/12/2022 Document Reviewed: 02/12/2022 Elsevier Patient Education  2023 [employer] Inc.  

## 2022-11-28 NOTE — Telephone Encounter (Signed)
Pt has stated have a cough, sore throat, some congestion and a little wheezing since Sunday. Pt states he has had these sxs since Sunday.  Pt wanted to come in today however, we do not have any avail apptmnts. Pt asked that I send provider a message for her advice.

## 2022-12-02 NOTE — Progress Notes (Unsigned)
Chief Complaint:   OBESITY [name] is here to discuss his progress with his obesity treatment plan along with follow-up of his obesity related diagnoses. [name] is on the Category 3 Plan and states he is following his eating plan approximately 60% of the time. [name] states he is swimming for 50 minutes 5 times per week, and weights for 45 minutes 3 times per week.  Today's visit was #: 25 Starting weight: 266 lbs Starting date: 01/01/2021 Today's weight: 250 lbs Today's date: 11/24/2022 Total lbs lost to date: 16 Total lbs lost since last in-office visit: 0  Interim History: [name] feels he did well on Thanksgiving, but struggled with eating the leftovers. He is making strategies to minimize Christmas weight gain.   Subjective:   1. Pre-diabetes [name]'s recent A1c was 5.7. He is struggling to remember the second dose of metformin. He has no history of GI upset.   2. Essential hypertension [name]'s blood pressure is stable on his medications, and he is working on his diet. No side effects noted. He has no signs of hypotension.   Assessment/Plan:   1. Pre-diabetes [name] agreed to change metformin to 500 mg 2 tablets daily, and we will refill for 1 month.   - metFORMIN (GLUCOPHAGE) 500 MG tablet; Take 2 tablets (1,000 mg total) by mouth daily with breakfast. TAKE 1 TABLET (500 MG TOTAL) BY MOUTH 2 (TWO) TIMES DAILY WITH A MEAL  Dispense: 60 tablet; Refill: 0  2. Essential hypertension [name] will continue her diet, exercise, and medications and we will continue to follow.   3. Obesity, Current BMI 34.9 [name] is currently in the action stage of change. As such, his goal is to continue with weight loss efforts. He has agreed to the Category 3 Plan.   Exercise goals: As is.   Behavioral modification strategies: meal planning and cooking strategies and holiday eating strategies .  [name] has agreed to follow-up with our clinic in 3 weeks. He was informed of the  importance of frequent follow-up visits to maximize his success with intensive lifestyle modifications for his multiple health conditions.   Objective:   Blood pressure 125/75, pulse 62, temperature 97.6 F (36.4 C), height '5\' 11"'$  (1.803 m), weight 250 lb (113.4 kg), SpO2 95 %. Body mass index is 34.87 kg/m.  General: Cooperative, alert, well developed, in no acute distress. HEENT: Conjunctivae and lids unremarkable. Cardiovascular: Regular rhythm.  Lungs: Normal work of breathing. Neurologic: No focal deficits.   Lab Results  Component Value Date   CREATININE 0.85 10/23/2022   BUN 14 10/23/2022   NA 140 10/23/2022   K 4.7 10/23/2022   CL 103 10/23/2022   CO2 23 10/23/2022   Lab Results  Component Value Date   ALT 61 (H) 10/23/2022   AST 51 (H) 10/23/2022   ALKPHOS 91 10/23/2022   BILITOT 0.8 10/23/2022   Lab Results  Component Value Date   HGBA1C 5.7 (H) 10/23/2022   HGBA1C 5.8 (H) 06/09/2022   HGBA1C 5.5 01/09/2022   HGBA1C 5.8 (H) 08/19/2021   HGBA1C 5.3 03/12/2021   Lab Results  Component Value Date   INSULIN 19.4 10/23/2022   INSULIN 15.2 06/09/2022   INSULIN 8.4 01/09/2022   INSULIN 22.7 08/19/2021   INSULIN 19.1 04/22/2021   Lab Results  Component Value Date   TSH 0.851 10/23/2022   Lab Results  Component Value Date   CHOL 117 05/27/2022   HDL 42 05/27/2022   LDLCALC 62 05/27/2022   LDLDIRECT  153.1 06/09/2011   TRIG 59 05/27/2022   CHOLHDL 2.8 05/27/2022   Lab Results  Component Value Date   VD25OH 60.2 10/23/2022   VD25OH 80.1 06/09/2022   VD25OH 42.5 01/09/2022   Lab Results  Component Value Date   WBC 7.0 04/16/2021   HGB 13.8 04/16/2021   HCT 40.4 04/16/2021   MCV 90.4 04/16/2021   PLT 124 (L) 04/16/2021   No results found for: "IRON", "TIBC", "FERRITIN"  Attestation Statements:   Reviewed by clinician on day of visit: allergies, medications, problem list, medical history, surgical history, family history, social history, and  previous encounter notes.   I, Trixie Dredge, am acting as transcriptionist for Dennard Nip, MD.  I have reviewed the above documentation for accuracy and completeness, and I agree with the above. -  Dennard Nip, MD

## 2022-12-15 ENCOUNTER — Other Ambulatory Visit (INDEPENDENT_AMBULATORY_CARE_PROVIDER_SITE_OTHER): Payer: Self-pay | Admitting: Physician Assistant

## 2022-12-15 ENCOUNTER — Ambulatory Visit (INDEPENDENT_AMBULATORY_CARE_PROVIDER_SITE_OTHER): Payer: Medicare Other | Admitting: Physician Assistant

## 2022-12-15 ENCOUNTER — Encounter (INDEPENDENT_AMBULATORY_CARE_PROVIDER_SITE_OTHER): Payer: Self-pay | Admitting: Physician Assistant

## 2022-12-15 VITALS — BP 135/84 | HR 58 | Temp 97.6°F | Ht 71.0 in | Wt 249.0 lb

## 2022-12-15 DIAGNOSIS — E559 Vitamin D deficiency, unspecified: Secondary | ICD-10-CM | POA: Diagnosis not present

## 2022-12-15 DIAGNOSIS — Z6834 Body mass index (BMI) 34.0-34.9, adult: Secondary | ICD-10-CM

## 2022-12-15 DIAGNOSIS — I10 Essential (primary) hypertension: Secondary | ICD-10-CM

## 2022-12-15 DIAGNOSIS — E669 Obesity, unspecified: Secondary | ICD-10-CM | POA: Diagnosis not present

## 2022-12-15 DIAGNOSIS — R7303 Prediabetes: Secondary | ICD-10-CM | POA: Diagnosis not present

## 2022-12-15 MED ORDER — METFORMIN HCL 500 MG PO TABS: 1000.0000 mg | ORAL_TABLET | Freq: Every day | ORAL | 0 refills | Status: DC

## 2022-12-18 ENCOUNTER — Ambulatory Visit (INDEPENDENT_AMBULATORY_CARE_PROVIDER_SITE_OTHER): Payer: Medicare Other | Admitting: Internal Medicine

## 2022-12-18 ENCOUNTER — Encounter: Payer: Self-pay | Admitting: Internal Medicine

## 2022-12-18 ENCOUNTER — Ambulatory Visit (INDEPENDENT_AMBULATORY_CARE_PROVIDER_SITE_OTHER): Payer: Medicare Other

## 2022-12-18 VITALS — BP 136/82 | HR 65 | Temp 97.8°F | Ht 71.0 in | Wt 258.0 lb

## 2022-12-18 DIAGNOSIS — J189 Pneumonia, unspecified organism: Secondary | ICD-10-CM

## 2022-12-18 DIAGNOSIS — I2510 Atherosclerotic heart disease of native coronary artery without angina pectoris: Secondary | ICD-10-CM | POA: Diagnosis not present

## 2022-12-18 DIAGNOSIS — D696 Thrombocytopenia, unspecified: Secondary | ICD-10-CM | POA: Diagnosis not present

## 2022-12-18 DIAGNOSIS — Z9861 Coronary angioplasty status: Secondary | ICD-10-CM | POA: Diagnosis not present

## 2022-12-18 DIAGNOSIS — I10 Essential (primary) hypertension: Secondary | ICD-10-CM | POA: Diagnosis not present

## 2022-12-18 NOTE — Progress Notes (Unsigned)
Subjective:  Patient ID: [name], male    DOB: May 27, 1951  Age: 71 y.o. MRN: [id number]  CC: Cough   HPI [name] presents for f/up -  His cough has improved.  He complains of fatigue but denies fever, chills, night sweats, or wheezing.  He is active with water aerobics and denies chest pain or diaphoresis.  Outpatient Medications Prior to Visit  Medication Sig Dispense Refill   aspirin 81 MG EC tablet Take 81 mg by mouth daily. Swallow whole.     atorvastatin (LIPITOR) 80 MG tablet TAKE 1 TABLET BY MOUTH DAILY AT 6 PM. 90 tablet 3   clopidogrel (PLAVIX) 75 MG tablet TAKE 1 TABLET BY MOUTH EVERY DAY 90 tablet 0   levothyroxine (SYNTHROID) 137 MCG tablet Take 1 tablet (137 mcg total) by mouth daily before breakfast. 90 tablet 3   metFORMIN (GLUCOPHAGE) 500 MG tablet Take 2 tablets (1,000 mg total) by mouth daily with breakfast. TAKE 1 TABLET (500 MG TOTAL) BY MOUTH 2 (TWO) TIMES DAILY WITH A MEAL 60 tablet 0   metoprolol succinate (TOPROL-XL) 25 MG 24 hr tablet TAKE 1 TABLET (25 MG TOTAL) BY MOUTH DAILY. 90 tablet 3   nitroGLYCERIN (NITROSTAT) 0.4 MG SL tablet PLACE 1 TABLET (0.4 MG TOTAL) UNDER THE TONGUE EVERY 5 (FIVE) MINUTES AS NEEDED FOR CHEST PAIN. 25 tablet 3   tamsulosin (FLOMAX) 0.4 MG CAPS capsule Take 0.4 mg by mouth daily.     telmisartan (MICARDIS) 80 MG tablet TAKE 1 TABLET BY MOUTH EVERY DAY 90 tablet 1   No facility-administered medications prior to visit.    ROS Review of Systems  Constitutional:  Positive for fatigue. Negative for chills, fever and unexpected weight change.  HENT: Negative.    Eyes: Negative.   Respiratory:  Positive for cough. Negative for chest tightness and wheezing.   Cardiovascular:  Negative for chest pain, palpitations and leg swelling.  Gastrointestinal:  Negative for abdominal pain, diarrhea and nausea.  Endocrine: Negative.   Genitourinary: Negative.  Negative for difficulty urinating.  Musculoskeletal:  Positive for  arthralgias. Negative for myalgias.  Skin: Negative.   Neurological: Negative.  Negative for dizziness.  Hematological:  Negative for adenopathy. Does not bruise/bleed easily.  Psychiatric/Behavioral: Negative.      Objective:  BP 136/82 (BP Location: Left Arm, Patient Position: Sitting, Cuff Size: Large)   Pulse 65   Temp 97.8 F (36.6 C) (Oral)   Ht '5\' 11"'$  (1.803 m)   Wt 258 lb (117 kg)   SpO2 93%   BMI 35.98 kg/m   BP Readings from Last 3 Encounters:  12/18/22 136/82  12/15/22 135/84  11/28/22 120/72    Wt Readings from Last 3 Encounters:  12/18/22 258 lb (117 kg)  12/15/22 249 lb (112.9 kg)  11/28/22 261 lb (118.4 kg)    Physical Exam Vitals reviewed.  Constitutional:      Appearance: He is not ill-appearing.  HENT:     Nose: Nose normal.     Mouth/Throat:     Mouth: Mucous membranes are moist.  Eyes:     General: No scleral icterus.    Conjunctiva/sclera: Conjunctivae normal.  Cardiovascular:     Rate and Rhythm: Normal rate and regular rhythm.     Heart sounds: No murmur heard. Pulmonary:     Effort: Pulmonary effort is normal.     Breath sounds: No stridor. No wheezing, rhonchi or rales.  Abdominal:     General: Abdomen is protuberant. There  is no distension.     Palpations: There is no mass.     Tenderness: There is no abdominal tenderness. There is no guarding.     Hernia: No hernia is present.  Musculoskeletal:        General: Normal range of motion.     Cervical back: Neck supple.     Right lower leg: No edema.     Left lower leg: No edema.  Lymphadenopathy:     Cervical: No cervical adenopathy.  Skin:    General: Skin is warm and dry.     Findings: No rash.  Neurological:     General: No focal deficit present.     Mental Status: He is alert.  Psychiatric:        Mood and Affect: Mood normal.        Behavior: Behavior normal.     Lab Results  Component Value Date   WBC 4.4 12/19/2022   HGB 15.1 12/19/2022   HCT 43.9 12/19/2022    PLT 122.0 (L) 12/19/2022   GLUCOSE 100 (H) 10/23/2022   CHOL 117 05/27/2022   TRIG 59 05/27/2022   HDL 42 05/27/2022   LDLDIRECT 153.1 06/09/2011   LDLCALC 62 05/27/2022   ALT 61 (H) 10/23/2022   AST 51 (H) 10/23/2022   NA 140 10/23/2022   K 4.7 10/23/2022   CL 103 10/23/2022   CREATININE 0.85 10/23/2022   BUN 14 10/23/2022   CO2 23 10/23/2022   TSH 0.851 10/23/2022   PSA 10 03/13/2022   INR 1.1 04/05/2021   HGBA1C 5.7 (H) 10/23/2022    DG Pelvis Portable  Result Date: 04/15/2021 CLINICAL DATA:  Post RIGHT hip prosthesis surface revision EXAM: PORTABLE PELVIS 1-2 VIEWS COMPARISON:  Portable exam 1617 hours compared to 01/04/2020 FINDINGS: BILATERAL hip prostheses again identified. No fracture, dislocation, or bone destruction. Tip of RIGHT femoral component not imaged. IMPRESSION: BILATERAL hip prostheses. No acute abnormalities. Electronically Signed   By: Lavonia Dana M.D.   On: 04/15/2021 16:40   DG Chest 2 View  Result Date: 12/21/2022 CLINICAL DATA:  Pneumonia follow-up EXAM: CHEST - 2 VIEW COMPARISON:  11/28/2022 FINDINGS: Stable cardiomediastinal silhouette. No pleural effusion or pneumothorax. The previous patchy right mid to upper lung opacity is no longer visualized. No new focal consolidation. IMPRESSION: No active cardiopulmonary disease. The previous right lung opacity has resolved. Electronically Signed   By: Placido Sou M.D.   On: 12/21/2022 23:50   DG Chest 2 View  Result Date: 11/28/2022 CLINICAL DATA:  cough for 5 days EXAM: CHEST - 2 VIEW COMPARISON:  12/30/2018 chest radiograph. FINDINGS: Stable cardiomediastinal silhouette with normal heart size. No pneumothorax. No pleural effusion. New mild patchy right mid to upper lung opacity. Clear left lung. IMPRESSION: New mild patchy right mid to upper lung opacity, suspicious for pneumonia. Recommend follow-up chest radiographs to resolution. Electronically Signed   By: Ilona Sorrel M.D.   On: 11/28/2022 09:06       No results found.  Assessment & Plan:   [name] was seen today for cough.  Diagnoses and all orders for this visit:  Pneumonia of right upper lobe due to infectious organism- His chest x-ray is normal now.  This has resolved. -     DG Chest 2 View; Future  Thrombocytopenia ([place])- His platelets are stable.  There is no history of bleeding or bruising.  B12 and folate are normal. -     CBC with Differential/Platelet; Future -  Vitamin B12; Future -     Folate; Future  Essential hypertension- His blood pressure is adequately well-controlled.   I am having [name] "[name]" maintain his tamsulosin, nitroGLYCERIN, aspirin EC, atorvastatin, clopidogrel, levothyroxine, metoprolol succinate, telmisartan, and metFORMIN.  No orders of the defined types were placed in this encounter.    Follow-up: No follow-ups on file.  Scarlette Calico, MD

## 2022-12-19 ENCOUNTER — Other Ambulatory Visit (INDEPENDENT_AMBULATORY_CARE_PROVIDER_SITE_OTHER): Payer: Medicare Other

## 2022-12-19 DIAGNOSIS — D696 Thrombocytopenia, unspecified: Secondary | ICD-10-CM | POA: Diagnosis not present

## 2022-12-19 LAB — CBC WITH DIFFERENTIAL/PLATELET
Basophils Absolute: 0.1 10*3/uL (ref 0.0–0.1)
Basophils Relative: 1.2 % (ref 0.0–3.0)
Eosinophils Absolute: 0.2 10*3/uL (ref 0.0–0.7)
Eosinophils Relative: 5.4 % — ABNORMAL HIGH (ref 0.0–5.0)
HCT: 43.9 % (ref 39.0–52.0)
Hemoglobin: 15.1 g/dL (ref 13.0–17.0)
Lymphocytes Relative: 27 % (ref 12.0–46.0)
Lymphs Abs: 1.2 10*3/uL (ref 0.7–4.0)
MCHC: 34.4 g/dL (ref 30.0–36.0)
MCV: 89.6 fl (ref 78.0–100.0)
Monocytes Absolute: 0.6 10*3/uL (ref 0.1–1.0)
Monocytes Relative: 14.6 % — ABNORMAL HIGH (ref 3.0–12.0)
Neutro Abs: 2.3 10*3/uL (ref 1.4–7.7)
Neutrophils Relative %: 51.8 % (ref 43.0–77.0)
Platelets: 122 10*3/uL — ABNORMAL LOW (ref 150.0–400.0)
RBC: 4.9 Mil/uL (ref 4.22–5.81)
RDW: 13.3 % (ref 11.5–15.5)
WBC: 4.4 10*3/uL (ref 4.0–10.5)

## 2022-12-20 LAB — FOLATE: Folate: 13.5 ng/mL (ref >5.9)

## 2022-12-20 LAB — VITAMIN B12: Vitamin B-12: 369 pg/mL (ref 211–911)

## 2023-01-01 NOTE — Progress Notes (Signed)
Chief Complaint:   OBESITY [name] is here to discuss his progress with his obesity treatment plan along with follow-up of his obesity related diagnoses. [name] is on the Category 3 Plan and states he is following his eating plan approximately 60% of the time. [name] states he is exercising 0 minutes 0 times per week.  Today's visit was #: 26 Starting weight: 266 lbs Starting date: 01/01/2021 Today's weight: 249 lbs Today's date: 12/15/2022 Total lbs lost to date: 17 lbs Total lbs lost since last in-office visit: 1  Interim History: [name] recently developed pneumonia and required antibiotics which he completed a couple of weeks ago. Starting to resume normal physical activity. Reports improving with decreased cough, but some fatigue persists.  Has done well overall with weight loss.   Subjective:   1. Pre-diabetes A1c 5.7. Taking Metformin 1000 mg with breakfast. Denies any side effects.  2. Essential hypertension Montario's blood pressure increased slightly today. Taking Toprol XL 20 mg daily and Micardis 80 mg daily. Denies any side effects. No symptoms of hypotension.  3. Vitamin D deficiency Donivan is taking over the counter Vit D 2,000 IU daily. Denies any side effects. Vit D level of 60.2 on 10/23/22.  Assessment/Plan:   1. Pre-diabetes Continue/Refill Metformin 500 mg twice a day for 1 month with 0 refills. Continue healthy eating plan to decrease simple carbohydrates, increase lean protein and exercise to promote weight loss.  -Refill metFORMIN (GLUCOPHAGE) 500 MG tablet; Take 2 tablets (1,000 mg total) by mouth daily with breakfast. TAKE 1 TABLET (500 MG TOTAL) BY MOUTH 2 (TWO) TIMES DAILY WITH A MEAL  Dispense: 60 tablet; Refill: 0  2. Essential hypertension Continue Toprol and Micardis--will monitor closely. Continue decreasing simple carbs, healthy eating, exercise to promote weight loss.  3. Vitamin D deficiency Continue over the counter Vit D 2,000 IU  daily. Recheck level in 2-3 months.  4. Obesity, Current BMI 34.8 [name] is currently in the action stage of change. As such, his goal is to continue with weight loss efforts. He has agreed to the Category 3 Plan.   Exercise goals: As is.  Behavioral modification strategies: increasing lean protein intake, decreasing simple carbohydrates, meal planning and cooking strategies, and holiday eating strategies .  [name] has agreed to follow-up with our clinic in 4 weeks. He was informed of the importance of frequent follow-up visits to maximize his success with intensive lifestyle modifications for his multiple health conditions.   Objective:   Blood pressure 135/84, pulse (!) 58, temperature 97.6 F (36.4 C), height '5\' 11"'$  (1.803 m), weight 249 lb (112.9 kg), SpO2 95 %. Body mass index is 34.73 kg/m.  General: Cooperative, alert, well developed, in no acute distress. HEENT: Conjunctivae and lids unremarkable. Cardiovascular: Regular rhythm.  Lungs: Normal work of breathing. Neurologic: No focal deficits.   Lab Results  Component Value Date   CREATININE 0.85 10/23/2022   BUN 14 10/23/2022   NA 140 10/23/2022   K 4.7 10/23/2022   CL 103 10/23/2022   CO2 23 10/23/2022   Lab Results  Component Value Date   ALT 61 (H) 10/23/2022   AST 51 (H) 10/23/2022   ALKPHOS 91 10/23/2022   BILITOT 0.8 10/23/2022   Lab Results  Component Value Date   HGBA1C 5.7 (H) 10/23/2022   HGBA1C 5.8 (H) 06/09/2022   HGBA1C 5.5 01/09/2022   HGBA1C 5.8 (H) 08/19/2021   HGBA1C 5.3 03/12/2021   Lab Results  Component Value Date   INSULIN  19.4 10/23/2022   INSULIN 15.2 06/09/2022   INSULIN 8.4 01/09/2022   INSULIN 22.7 08/19/2021   INSULIN 19.1 04/22/2021   Lab Results  Component Value Date   TSH 0.851 10/23/2022   Lab Results  Component Value Date   CHOL 117 05/27/2022   HDL 42 05/27/2022   LDLCALC 62 05/27/2022   LDLDIRECT 153.1 06/09/2011   TRIG 59 05/27/2022   CHOLHDL 2.8  05/27/2022   Lab Results  Component Value Date   VD25OH 60.2 10/23/2022   VD25OH 80.1 06/09/2022   VD25OH 42.5 01/09/2022   Lab Results  Component Value Date   WBC 4.4 12/19/2022   HGB 15.1 12/19/2022   HCT 43.9 12/19/2022   MCV 89.6 12/19/2022   PLT 122.0 (L) 12/19/2022   No results found for: "IRON", "TIBC", "FERRITIN"  Attestation Statements:   Reviewed by clinician on day of visit: allergies, medications, problem list, medical history, surgical history, family history, social history, and previous encounter notes.  I, Brendell Tyus, am acting as transcriptionist for [employer], PA.  I have reviewed the above documentation for accuracy and completeness, and I agree with the above. -  Bobbye Reinitz,PA-C

## 2023-01-05 DIAGNOSIS — M5416 Radiculopathy, lumbar region: Secondary | ICD-10-CM | POA: Diagnosis not present

## 2023-01-11 ENCOUNTER — Other Ambulatory Visit: Payer: Self-pay | Admitting: Cardiology

## 2023-01-14 ENCOUNTER — Ambulatory Visit (INDEPENDENT_AMBULATORY_CARE_PROVIDER_SITE_OTHER): Payer: Medicare Other | Admitting: Adult Health

## 2023-01-19 ENCOUNTER — Encounter (INDEPENDENT_AMBULATORY_CARE_PROVIDER_SITE_OTHER): Payer: Self-pay | Admitting: Adult Health

## 2023-01-19 ENCOUNTER — Ambulatory Visit (INDEPENDENT_AMBULATORY_CARE_PROVIDER_SITE_OTHER): Payer: Medicare Other | Admitting: Adult Health

## 2023-01-19 VITALS — BP 129/77 | HR 57 | Temp 97.8°F | Ht 71.0 in | Wt 254.0 lb

## 2023-01-19 DIAGNOSIS — R7303 Prediabetes: Secondary | ICD-10-CM | POA: Diagnosis not present

## 2023-01-19 DIAGNOSIS — E669 Obesity, unspecified: Secondary | ICD-10-CM

## 2023-01-19 DIAGNOSIS — D696 Thrombocytopenia, unspecified: Secondary | ICD-10-CM | POA: Diagnosis not present

## 2023-01-19 DIAGNOSIS — Z6835 Body mass index (BMI) 35.0-35.9, adult: Secondary | ICD-10-CM | POA: Diagnosis not present

## 2023-01-20 ENCOUNTER — Telehealth: Payer: Self-pay | Admitting: Internal Medicine

## 2023-01-20 NOTE — Telephone Encounter (Signed)
[facility] called to follow up on  a fax they sent 01/14/23, I dint see where we received it , so I got them to refax.

## 2023-01-21 ENCOUNTER — Ambulatory Visit (INDEPENDENT_AMBULATORY_CARE_PROVIDER_SITE_OTHER): Payer: Medicare Other | Admitting: Podiatry

## 2023-01-21 DIAGNOSIS — L600 Ingrowing nail: Secondary | ICD-10-CM | POA: Diagnosis not present

## 2023-01-21 NOTE — Progress Notes (Signed)
Subjective: [name] is a 72 y.o.  male returns to office today for follow up evaluation after having right Hallux Lateral border nail avulsion performed. Patient has been soaking using epsom salt and applying topical antibiotic covered with bandaid daily. Patient denies fevers, chills, nausea, vomiting. Denies any calf pain, chest pain, SOB.   Objective:  Vitals: Reviewed  General: Well developed, nourished, in no acute distress, alert and oriented x3   Dermatology: Skin is warm, dry and supple bilateral. Lateral hallux nail border appears to be clean, dry, with mild granular tissue and surrounding scab. There is no surrounding erythema, edema, drainage/purulence. The remaining nails appear unremarkable at this time. There are no other lesions or other signs of infection present.  Neurovascular status: Intact. No lower extremity swelling; No pain with calf compression bilateral.  Musculoskeletal: Decreased tenderness to palpation of the Lateral hallux nail fold(s). Muscular strength within normal limits bilateral.   Assesement and Plan: S/p partial nail avulsion, doing well.   -Discontinue soaking in epsom salts twice a day followed by antibiotic ointment and a band-aid as needed. Can leave uncovered at night. .  -If the area does not heal properply, call the office for follow-up appointment, or sooner if any problems arise.  -Monitor for any signs/symptoms of infection. Call the office immediately if any occur or go directly to the emergency room. Call with any questions/concerns.  Boneta Lucks, DPM

## 2023-01-22 ENCOUNTER — Encounter (HOSPITAL_BASED_OUTPATIENT_CLINIC_OR_DEPARTMENT_OTHER): Payer: Self-pay | Admitting: Cardiology

## 2023-01-28 ENCOUNTER — Ambulatory Visit: Payer: Medicare Other | Admitting: Podiatry

## 2023-02-02 NOTE — Progress Notes (Unsigned)
Chief Complaint:   OBESITY [name] is here to discuss his progress with his obesity treatment plan along with follow-up of his obesity related diagnoses. [name] is on the Category 3 Plan and states he is following his eating plan approximately 60% of the time. [name] states he is swimming, weights 60+ minutes 3-5 times per week.  Today's visit was #: 57 Starting weight: 266 lbs Starting date: 01/01/2021 Today's weight: 254 lbs Today's date: 01/19/2023 Total lbs lost to date: 12 lbs Total lbs lost since last in-office visit: +5 lbs  Interim History: ***  Subjective:   1. Pre-diabetes A1c *** Metformin 500 mg 2 tablets at breakfast.  He endorses break through polyphagia in the late afternoon.  He was inconsistent with lunch metformin splitting 500 mg to BID.   2. Low platelet count ([name]) He endorses easy bruising, he denies prolonged bleeding.  He is taking ASA 81 mg daily, Plavix 75 mg daily.  Inferior STEMI, PCI of PDA.  Patient denies SOB.   Assessment/Plan:   1. Pre-diabetes Continue with metformin 500 mg 2 tablets at breakfast.    2. Low platelet count ([name]) Epic review platelet trending 120's, since 02/2021. Follow up with Dr Radford Pax in cardiology.   3. Obesity, Current BMI 35.4 [name] is currently in the action stage of change. As such, his goal is to continue with weight loss efforts. He has agreed to the Category 3 Plan.   Exercise goals:  As is.   Behavioral modification strategies: increasing lean protein intake, decreasing simple carbohydrates, meal planning and cooking strategies, keeping healthy foods in the home, and planning for success.  [name] has agreed to follow-up with our clinic in 4 weeks. He was informed of the importance of frequent follow-up visits to maximize his success with intensive lifestyle modifications for his multiple health conditions.   Objective:   Blood pressure 129/77, pulse (!) 57, temperature 97.8 F (36.6 C), height 5'  11" (1.803 m), weight 254 lb (115.2 kg), SpO2 97 %. Body mass index is 35.43 kg/m.  General: Cooperative, alert, well developed, in no acute distress. HEENT: Conjunctivae and lids unremarkable. Cardiovascular: Regular rhythm.  Lungs: Normal work of breathing. Neurologic: No focal deficits.   Lab Results  Component Value Date   CREATININE 0.85 10/23/2022   BUN 14 10/23/2022   NA 140 10/23/2022   K 4.7 10/23/2022   CL 103 10/23/2022   CO2 23 10/23/2022   Lab Results  Component Value Date   ALT 61 (H) 10/23/2022   AST 51 (H) 10/23/2022   ALKPHOS 91 10/23/2022   BILITOT 0.8 10/23/2022   Lab Results  Component Value Date   HGBA1C 5.7 (H) 10/23/2022   HGBA1C 5.8 (H) 06/09/2022   HGBA1C 5.5 01/09/2022   HGBA1C 5.8 (H) 08/19/2021   HGBA1C 5.3 03/12/2021   Lab Results  Component Value Date   INSULIN 19.4 10/23/2022   INSULIN 15.2 06/09/2022   INSULIN 8.4 01/09/2022   INSULIN 22.7 08/19/2021   INSULIN 19.1 04/22/2021   Lab Results  Component Value Date   TSH 0.851 10/23/2022   Lab Results  Component Value Date   CHOL 117 05/27/2022   HDL 42 05/27/2022   LDLCALC 62 05/27/2022   LDLDIRECT 153.1 06/09/2011   TRIG 59 05/27/2022   CHOLHDL 2.8 05/27/2022   Lab Results  Component Value Date   VD25OH 60.2 10/23/2022   VD25OH 80.1 06/09/2022   VD25OH 42.5 01/09/2022   Lab Results  Component Value Date  WBC 4.4 12/19/2022   HGB 15.1 12/19/2022   HCT 43.9 12/19/2022   MCV 89.6 12/19/2022   PLT 122.0 (L) 12/19/2022   No results found for: "IRON", "TIBC", "FERRITIN"  Attestation Statements:   Reviewed by clinician on day of visit: allergies, medications, problem list, medical history, surgical history, family history, social history, and previous encounter notes.  I, Davy Pique, RMA, am acting as Location manager for Mina Marble, NP.  I have reviewed the above documentation for accuracy and completeness, and I agree with the above. -  ***

## 2023-02-05 ENCOUNTER — Telehealth: Payer: Self-pay | Admitting: Internal Medicine

## 2023-02-05 NOTE — Telephone Encounter (Signed)
[facility] called in reference to a fax for an auto immune test for patient, states it was faxed on 01/28/23. Best callback is [phone].

## 2023-02-05 NOTE — Telephone Encounter (Signed)
We will not be completing this form if they call back.

## 2023-02-11 DIAGNOSIS — N401 Enlarged prostate with lower urinary tract symptoms: Secondary | ICD-10-CM | POA: Diagnosis not present

## 2023-02-11 DIAGNOSIS — R310 Gross hematuria: Secondary | ICD-10-CM | POA: Diagnosis not present

## 2023-02-11 DIAGNOSIS — R338 Other retention of urine: Secondary | ICD-10-CM | POA: Diagnosis not present

## 2023-02-12 ENCOUNTER — Other Ambulatory Visit: Payer: Self-pay | Admitting: Cardiology

## 2023-02-12 DIAGNOSIS — R972 Elevated prostate specific antigen [PSA]: Secondary | ICD-10-CM | POA: Diagnosis not present

## 2023-02-12 DIAGNOSIS — K429 Umbilical hernia without obstruction or gangrene: Secondary | ICD-10-CM | POA: Diagnosis not present

## 2023-02-12 DIAGNOSIS — R338 Other retention of urine: Secondary | ICD-10-CM | POA: Diagnosis not present

## 2023-02-13 ENCOUNTER — Other Ambulatory Visit: Payer: Self-pay | Admitting: Urology

## 2023-02-13 ENCOUNTER — Telehealth: Payer: Self-pay | Admitting: Cardiology

## 2023-02-13 NOTE — Telephone Encounter (Signed)
   Pre-operative Risk Assessment    Patient Name: [name]  DOB: May 24, 1951 MRN: [id number]      Request for Surgical Clearance    Procedure:   Robotic simple prostatectomy and Umbilical Hernia Repair  Date of Surgery:  Clearance 03/11/23                                 Surgeon:  Dr. Alexis Frock Surgeon's Group or Practice Name:  [facility] Phone number:  [phone]  Fax number:  [phone]   Type of Clearance Requested:   - Medical  - Pharmacy:  Hold Aspirin and Clopidogrel (Plavix)     Type of Anesthesia:  General    Additional requests/questions:   Caller noted patient will need to stop Aspirin 5 days prior to surgery and Plavix 5-7 days prior to surgery.  Signed, Heloise Beecham   02/13/2023, 1:23 PM

## 2023-02-13 NOTE — Telephone Encounter (Signed)
[facility] called and I was able to inform them that Dr. Quay Burow will not be filling this form out.

## 2023-02-16 ENCOUNTER — Telehealth: Payer: Self-pay | Admitting: *Deleted

## 2023-02-16 NOTE — Telephone Encounter (Signed)
I s/w the pt and he has been scheduled for tele pre op appt 02/23/23 @ 3 pm. Med rec and consent are done.      Patient Consent for Virtual Visit        [name] has provided verbal consent on 02/16/2023 for a virtual visit (video or telephone).   CONSENT FOR VIRTUAL VISIT FOR:  [name]  By participating in this virtual visit I agree to the following:  I hereby voluntarily request, consent and authorize [facility] and its employed or contracted physicians, physician assistants, nurse practitioners or other licensed health care professionals (the Practitioner), to provide me with telemedicine health care services (the "Services") as deemed necessary by the treating Practitioner. I acknowledge and consent to receive the Services by the Practitioner via telemedicine. I understand that the telemedicine visit will involve communicating with the Practitioner through live audiovisual communication technology and the disclosure of certain medical information by electronic transmission. I acknowledge that I have been given the opportunity to request an in-person assessment or other available alternative prior to the telemedicine visit and am voluntarily participating in the telemedicine visit.  I understand that I have the right to withhold or withdraw my consent to the use of telemedicine in the course of my care at any time, without affecting my right to future care or treatment, and that the Practitioner or I may terminate the telemedicine visit at any time. I understand that I have the right to inspect all information obtained and/or recorded in the course of the telemedicine visit and may receive copies of available information for a reasonable fee.  I understand that some of the potential risks of receiving the Services via telemedicine include:  Delay or interruption in medical evaluation due to technological equipment failure or disruption; Information transmitted may not be  sufficient (e.g. poor resolution of images) to allow for appropriate medical decision making by the Practitioner; and/or  In rare instances, security protocols could fail, causing a breach of personal health information.  Furthermore, I acknowledge that it is my responsibility to provide information about my medical history, conditions and care that is complete and accurate to the best of my ability. I acknowledge that Practitioner's advice, recommendations, and/or decision may be based on factors not within their control, such as incomplete or inaccurate data provided by me or distortions of diagnostic images or specimens that may result from electronic transmissions. I understand that the practice of medicine is not an exact science and that Practitioner makes no warranties or guarantees regarding treatment outcomes. I acknowledge that a copy of this consent can be made available to me via my patient portal ([facility]), or I can request a printed copy by calling the office of [facility].    I understand that my insurance will be billed for this visit.   I have read or had this consent read to me. I understand the contents of this consent, which adequately explains the benefits and risks of the Services being provided via telemedicine.  I have been provided ample opportunity to ask questions regarding this consent and the Services and have had my questions answered to my satisfaction. I give my informed consent for the services to be provided through the use of telemedicine in my medical care

## 2023-02-16 NOTE — Telephone Encounter (Signed)
I s/w the pt and he has been scheduled for tele pre op appt 02/23/23 @ 3 pm. Med rec and consent are done.

## 2023-02-16 NOTE — Telephone Encounter (Signed)
Pt spouse returning call

## 2023-02-16 NOTE — Telephone Encounter (Signed)
Primary Cardiologist:Traci Radford Pax, MD   Preoperative team, please contact this patient and set up a phone call appointment for further preoperative risk assessment. Please obtain consent and complete medication review. Thank you for your help.   Pending no symptoms of ACS at the time of virtual assessment, he may hold aspirin and Plavix for 5 days prior to surgery. He should resume DAPT as soon as hemodynamically stable post procedure.   Emmaline Life, NP-C  02/16/2023, 10:31 AM [address] Office [phone] Fax [phone]

## 2023-02-23 ENCOUNTER — Ambulatory Visit: Payer: Medicare Other | Attending: Cardiology | Admitting: Physician Assistant

## 2023-02-23 DIAGNOSIS — Z01810 Encounter for preprocedural cardiovascular examination: Secondary | ICD-10-CM | POA: Diagnosis not present

## 2023-02-23 NOTE — Progress Notes (Signed)
Virtual Visit via Telephone Note   Because of [name] co-morbid illnesses, he is at least at moderate risk for complications without adequate follow up.  This format is felt to be most appropriate for this patient at this time.  The patient did not have access to video technology/had technical difficulties with video requiring transitioning to audio format only (telephone).  All issues noted in this document were discussed and addressed.  No physical exam could be performed with this format.  Please refer to the patient's chart for his consent to telehealth for [facility].  Evaluation Performed:  Preoperative cardiovascular risk assessment _____________   Date:  02/23/2023   Patient ID:  [name], DOB 06/16/1951, MRN [id number] Patient Location:  Home Provider location:   Office  Primary Care Provider:  Binnie Rail, MD Primary Cardiologist:  Fransico Him, MD  Chief Complaint / Patient Profile   72 y.o. y/o male with a h/o CAD with inferior STEMI 2020 s/p DES to PDA with residual disease (chronic total occlusion of Cx with collaterals with unsuccessful attempt at opening, mid LAD lesion to be treated medically), PAF (?remote dx, see notes below), OSA on CPAP, HTN who is pending robotic simple prostatectomy and umbilical hernia repair and presents today for telephonic preoperative cardiovascular risk assessment.  History of Present Illness    [name] is a 72 y.o. male who presents via audio/video conferencing for a telehealth visit today.  Pt was last seen in cardiology clinic on 05/27/22 by Dr. Radford Pax.  At that time [name] was doing well. The last note indicates h/o PAF on anticoagulation. I do not see that is the case. I reached out to Dr. Radford Pax who confirmed we have not been seeing atrial fibrillation in recent years therefore not on [place]. This stems from a remote referral in 2019 from PCP for "Episode of sleep apnea and Afib during urological  procedure." He has not had recurrence and confirms he has never been on a DOAC. The patient is now pending procedure as outlined above. Since his last visit, he reports he's been doing great from a cardiac standpoint. He was swimming 5 days a week up until 2 weeks ago when he had to have a catheter placed by urology. However, he's remained physically active in other ways at the gym since then without any anginal symptoms.   Past Medical History    Past Medical History:  Diagnosis Date   Back pain    BPH (benign prostatic hypertrophy)    Chest pain    COLONIC POLYPS, HX OF 2007   clear colo w/o polyps 04/2015: 14yrfollow up   Constipation    Coronary artery disease    Decreased mobility    due to BL knee and hip replacement   Fatigue    GERD (gastroesophageal reflux disease)    Heartburn    History of kidney stones    passed   Hyperlipidemia    HYPERTENSION    HYPOTHYROIDISM    Joint pain    Myocardial infarction ([facility] 12/2018   Inferior STEMI   OA (osteoarthritis)    Knees   OBESITY    OSA treated with BiPAP    PAF (paroxysmal atrial fibrillation) (HCC)    Pre-diabetes    Shortness of breath on exertion    Umbilical hernia    Ventral hernia    Past Surgical History:  Procedure Laterality Date   arthroscopic knee surgery     (  R) 2003 & (L) 2010   CARDIAC CATHETERIZATION     COLONOSCOPY  04/2015   no polyps ([place])   CORONARY/GRAFT ACUTE MI REVASCULARIZATION N/A 12/30/2018   Procedure: Coronary/Graft Acute MI Revascularization;  Surgeon: Belva Crome, MD;  Location: [facility];  Service: Cardiovascular;  Laterality: N/A;   HIP CLOSED REDUCTION Right 03/12/2021   Procedure: CLOSED REDUCTION HIP;  Surgeon: Melina Schools, MD;  Location: [facility];  Service: Orthopedics;  Laterality: Right;   LAMINECTOMY  1991   LEFT HEART CATH AND CORONARY ANGIOGRAPHY N/A 12/30/2018   Procedure: LEFT HEART CATH AND CORONARY ANGIOGRAPHY;  Surgeon: Belva Crome, MD;  Location: [facility];  Service: Cardiovascular;  Laterality: N/A;   TONSILLECTOMY  1990   w/ adenoids and uvula   TOTAL HIP ARTHROPLASTY Right 01/2011   [place]   TOTAL HIP ARTHROPLASTY Left 04/16/2016   Procedure: TOTAL HIP ARTHROPLASTY ANTERIOR APPROACH;  Surgeon: Gaynelle Arabian, MD;  Location: [facility];  Service: Orthopedics;  Laterality: Left;   TOTAL HIP REVISION Right 01/04/2020   Procedure: Right hip bearing surface;  Surgeon: Gaynelle Arabian, MD;  Location: [facility];  Service: Orthopedics;  Laterality: Right;  110mn   TOTAL HIP REVISION Right 04/15/2021   Procedure: Right hip bearing surface revision;  Surgeon: AGaynelle Arabian MD;  Location: [facility];  Service: Orthopedics;  Laterality: Right;  924m   TOTAL KNEE ARTHROPLASTY  12/27/2012   Procedure: TOTAL KNEE BILATERAL;  Surgeon: FrGearlean AlfMD;  Location: [facility];  Service: Orthopedics;  Laterality: Bilateral;    Allergies  No Known Allergies  Home Medications    Prior to Admission medications   Medication Sig Start Date End Date Taking? Authorizing Provider  aspirin 81 MG EC tablet Take 81 mg by mouth daily. Swallow whole.    [provider]  atorvastatin (LIPITOR) 80 MG tablet TAKE 1 TABLET BY MOUTH DAILY AT 6 PM. 05/05/22   Turner, TrEber HongMD  clopidogrel (PLAVIX) 75 MG tablet TAKE 1 TABLET BY MOUTH EVERY DAY 01/12/23   TuSueanne MargaritaMD  finasteride (PROSCAR) 5 MG tablet Take 5 mg by mouth daily. 02/11/23   [provider]  levothyroxine (SYNTHROID) 137 MCG tablet Take 1 tablet (137 mcg total) by mouth daily before breakfast. 06/12/22   BuBinnie RailMD  metFORMIN (GLUCOPHAGE) 500 MG tablet Take 2 tablets (1,000 mg total) by mouth daily with breakfast. 01/12/23   Rayburn, ShNeta MendsPA-C  metoprolol succinate (TOPROL-XL) 25 MG 24 hr tablet TAKE 1 TABLET (25 MG TOTAL) BY MOUTH DAILY. 06/24/22   TuSueanne MargaritaMD  nitroGLYCERIN (NITROSTAT) 0.4 MG SL tablet PLACE 1 TABLET (0.4 MG TOTAL) UNDER THE TONGUE EVERY 5  (FIVE) MINUTES AS NEEDED FOR CHEST PAIN. 11/19/20   TuSueanne MargaritaMD  tamsulosin (FLOMAX) 0.4 MG CAPS capsule Take 0.4 mg by mouth daily. 03/21/20   [provider]  telmisartan (MICARDIS) 80 MG tablet TAKE 1 TABLET BY MOUTH EVERY DAY 02/12/23   TuSueanne MargaritaMD    Physical Exam    Vital Signs:  ViNATHANUAL RASKoes not have vital signs available for review today but reports BP/HR trends have otherwise been normal.  Given telephonic nature of communication, physical exam is limited. AAOx3. NAD. Normal affect.  Speech and respirations are unlabored.  Accessory Clinical Findings    None  Assessment & Plan    1.  Preoperative Cardiovascular Risk Assessment: RCRI 0.9% indicating low CV risk.  The patient affirms he has been doing well without any new cardiac symptoms. They are able to achieve over 4 METS without cardiac limitations - exercising regularly without angina or dyspnea. Therefore, based on ACC/AHA guidelines, the patient would be at acceptable risk for the planned procedure without further cardiovascular testing. The patient was advised that if he develops new symptoms prior to surgery to contact our office to arrange for a follow-up visit, and he verbalized understanding.  Per my review with Dr. Radford Pax today, patient may hold aspirin and Plavix as requested. Intake stated needing to hold aspirin for 5 days prior to surgery and Plavix 5-7 days prior to surgery, patient/wife confirmed their instructions stated this today. Please resume antiplatelet therapy when felt safe by performing surgeon.  A copy of this note will be routed to requesting surgeon.  Time:   Today, I have spent 5 minutes with the patient with telehealth technology discussing medical history, symptoms, and management plan.     Charlie Pitter, PA-C  02/23/2023, 11:20 AM

## 2023-02-24 ENCOUNTER — Encounter (INDEPENDENT_AMBULATORY_CARE_PROVIDER_SITE_OTHER): Payer: Self-pay | Admitting: Adult Health

## 2023-02-24 ENCOUNTER — Ambulatory Visit (INDEPENDENT_AMBULATORY_CARE_PROVIDER_SITE_OTHER): Payer: Medicare Other | Admitting: Adult Health

## 2023-02-24 VITALS — BP 111/72 | HR 66 | Temp 97.7°F | Ht 71.0 in | Wt 249.0 lb

## 2023-02-24 DIAGNOSIS — R7303 Prediabetes: Secondary | ICD-10-CM

## 2023-02-24 DIAGNOSIS — I10 Essential (primary) hypertension: Secondary | ICD-10-CM | POA: Diagnosis not present

## 2023-02-24 DIAGNOSIS — Z6834 Body mass index (BMI) 34.0-34.9, adult: Secondary | ICD-10-CM

## 2023-02-24 DIAGNOSIS — E559 Vitamin D deficiency, unspecified: Secondary | ICD-10-CM | POA: Diagnosis not present

## 2023-02-24 DIAGNOSIS — N400 Enlarged prostate without lower urinary tract symptoms: Secondary | ICD-10-CM | POA: Diagnosis not present

## 2023-02-24 DIAGNOSIS — E669 Obesity, unspecified: Secondary | ICD-10-CM | POA: Diagnosis not present

## 2023-02-24 MED ORDER — METFORMIN HCL 500 MG PO TABS: 1000.0000 mg | ORAL_TABLET | Freq: Every day | ORAL | 0 refills | Status: DC

## 2023-02-24 MED ORDER — METFORMIN HCL 500 MG PO TABS: 1000.0000 mg | ORAL_TABLET | Freq: Every day | ORAL | 1 refills | Status: DC

## 2023-02-24 NOTE — Progress Notes (Signed)
Chief Complaint:   OBESITY [name] is here to discuss his progress with his obesity treatment plan along with follow-up of his obesity related diagnoses. [name] is on the Category 3 Plan and states he is following his eating plan approximately 60% of the time.  [name] states he is not currently exercising- upcoming prostatectomy.  Today's visit was #: 28 Starting weight: 266 lbs Starting date: 01/01/2021 Today's weight: 249 lbs Today's date: 02/24/2023 Total lbs lost to date: 17 lbs Total lbs lost since last in-office visit: - 5 lbs  Interim History:  He and his wife travelled to [place]- all inclusive resort. Well on holiday- he experienced urological complications. He has hx of BPH- experienced complete blockage- required catheterization.  He has been seen by Urology since home- new catheter placed and he is scheduled for Prostatectomy early March 2024.  He denies N/V/fever/malaise.  Subjective:   1. Enlarged prostate 03/11/2023-  Dr. Clint Lipps- Procedure Laterality Anesthesia  XI ROBOTIC ASSISTED SIMPLE PROSTATECTOMY N/A Choice  3 HRS    HERNIA REPAIR UMBILICAL ADULT      2. Pre-diabetes Lab Results  Component Value Date   HGBA1C 5.7 (H) 10/23/2022   HGBA1C 5.8 (H) 06/09/2022   HGBA1C 5.5 01/09/2022   He is taking Metformin '500mg'$  2- tabs QAM   He denies GI upset  3. Essential hypertension BP at goal at OV He is on Toprol XL '25mg'$  QD, Micardis 16m QD  4. Vit D Def  Latest Reference Range & Units 10/23/22 10:40  Vitamin D, 25-Hydroxy 30.0 - 100.0 ng/mL 60.2   Assessment/Plan:   1. Enlarged prostate 03/11/23  Procedure Laterality Anesthesia  XI ROBOTIC ASSISTED SIMPLE PROSTATECTOMY N/A Choice  3 HRS    HERNIA REPAIR UMBILICAL ADULT    Check Labs -CMP  2. Pre-diabetes Check Labs -A1c -insulin level Refill - metFORMIN (GLUCOPHAGE) 500 MG tablet; Take 2 tablets (1,000 mg total) by mouth daily with breakfast.  Dispense: 60 tablet; Refill: 0  3.  Essential hypertension [facility] Labs -CMP  Limit Na+ intake. Remain well hydrated.  4. Vit D Def Check Labs Vit D Level  5. Obesity, Current BMI 34.8  [name] currently in the action stage of change. As such, his goal is to continue with weight loss efforts. He has agreed to the Category 3 Plan.   Strive for 30g protein at each meal.  Exercise goals: Older adults with chronic conditions should understand whether and how their conditions affect their ability to do regular physical activity safely.  Behavioral modification strategies: increasing lean protein intake, decreasing simple carbohydrates, increasing vegetables, increasing water intake, meal planning and cooking strategies, and planning for success.  [name] agreed to follow-up with our clinic in 6 weeks. He was informed of the importance of frequent follow-up visits to maximize his success with intensive lifestyle modifications for his multiple health conditions.   Review Lab Results at f/u OV  Objective:   Blood pressure 111/72, pulse 66, temperature 97.7 F (36.5 C), height '5\' 11"'$  (1.803 m), weight 249 lb (112.9 kg), SpO2 97 %. Body mass index is 34.73 kg/m.  General: Cooperative, alert, well developed, in no acute distress. HEENT: Conjunctivae and lids unremarkable. Cardiovascular: Regular rhythm.  Lungs: Normal work of breathing. Neurologic: No focal deficits.   Lab Results  Component Value Date   CREATININE 0.85 10/23/2022   BUN 14 10/23/2022   NA 140 10/23/2022   K 4.7 10/23/2022   CL 103 10/23/2022   CO2 23  10/23/2022   Lab Results  Component Value Date   ALT 61 (H) 10/23/2022   AST 51 (H) 10/23/2022   ALKPHOS 91 10/23/2022   BILITOT 0.8 10/23/2022   Lab Results  Component Value Date   HGBA1C 5.7 (H) 10/23/2022   HGBA1C 5.8 (H) 06/09/2022   HGBA1C 5.5 01/09/2022   HGBA1C 5.8 (H) 08/19/2021   HGBA1C 5.3 03/12/2021   Lab Results  Component Value Date   INSULIN 19.4 10/23/2022   INSULIN  15.2 06/09/2022   INSULIN 8.4 01/09/2022   INSULIN 22.7 08/19/2021   INSULIN 19.1 04/22/2021   Lab Results  Component Value Date   TSH 0.851 10/23/2022   Lab Results  Component Value Date   CHOL 117 05/27/2022   HDL 42 05/27/2022   LDLCALC 62 05/27/2022   LDLDIRECT 153.1 06/09/2011   TRIG 59 05/27/2022   CHOLHDL 2.8 05/27/2022   Lab Results  Component Value Date   VD25OH 60.2 10/23/2022   VD25OH 80.1 06/09/2022   VD25OH 42.5 01/09/2022   Lab Results  Component Value Date   WBC 4.4 12/19/2022   HGB 15.1 12/19/2022   HCT 43.9 12/19/2022   MCV 89.6 12/19/2022   PLT 122.0 (L) 12/19/2022   No results found for: "IRON", "TIBC", "FERRITIN"  Attestation Statements:   Reviewed by clinician on day of visit: allergies, medications, problem list, medical history, surgical history, family history, social history, and previous encounter notes.  I have reviewed the above documentation for accuracy and completeness, and I agree with the above. -  Scottlyn Mchaney d. Anne Sebring, NP-C

## 2023-02-25 LAB — COMPREHENSIVE METABOLIC PANEL
ALT: 47 IU/L — ABNORMAL HIGH (ref 0–44)
AST: 42 IU/L — ABNORMAL HIGH (ref 0–40)
Albumin/Globulin Ratio: 1.5 (ref 1.2–2.2)
Albumin: 4.4 g/dL (ref 3.9–4.9)
Alkaline Phosphatase: 96 IU/L (ref 44–121)
BUN/Creatinine Ratio: 18 (ref 10–24)
BUN: 15 mg/dL (ref 8–27)
Bilirubin Total: 0.7 mg/dL (ref 0.0–1.2)
CO2: 21 mmol/L (ref 20–29)
Calcium: 9.5 mg/dL (ref 8.6–10.2)
Chloride: 103 mmol/L (ref 96–106)
Creatinine, Ser: 0.84 mg/dL (ref 0.76–1.27)
Globulin, Total: 2.9 g/dL (ref 1.5–4.5)
Glucose: 101 mg/dL — ABNORMAL HIGH (ref 70–99)
Potassium: 4.5 mmol/L (ref 3.5–5.2)
Sodium: 138 mmol/L (ref 134–144)
Total Protein: 7.3 g/dL (ref 6.0–8.5)
eGFR: 94 mL/min/{1.73_m2} (ref >59)

## 2023-02-25 LAB — HEMOGLOBIN A1C
Est. average glucose Bld gHb Est-mCnc: 123 mg/dL
Hgb A1c MFr Bld: 5.9 % — ABNORMAL HIGH (ref 4.8–5.6)

## 2023-02-25 LAB — INSULIN, RANDOM: INSULIN: 15.6 u[IU]/mL (ref 2.6–24.9)

## 2023-02-25 LAB — VITAMIN D 25 HYDROXY (VIT D DEFICIENCY, FRACTURES): Vit D, 25-Hydroxy: 46.8 ng/mL (ref 30.0–100.0)

## 2023-02-25 LAB — VITAMIN B12: Vitamin B-12: 462 pg/mL (ref 232–1245)

## 2023-03-04 NOTE — Progress Notes (Addendum)
COVID Vaccine Completed: yes  Date of COVID positive in last 90 days:  PCP - Billey Gosling, MD Cardiologist - Fransico Him, MD  Cardiac clearance by Melina Copa 02/23/23 in [facility] [place]  Chest x-ray - 12/18/22 Epic EKG - 05/27/22 Epic Stress Test - 2019 ECHO - 2020 Cardiac Cath - 2020 Pacemaker/ICD device last checked: Spinal Cord Stimulator:  Bowel Prep -   Sleep Study -  CPAP - BiPAP  Fasting Blood Sugar - pre DM Checks Blood Sugar _____ times a day  Last dose of GLP1 agonist-  N/A GLP1 instructions:  N/A   Last dose of SGLT-2 inhibitors-  N/A SGLT-2 instructions: N/A   Blood Thinner Instructions: Plavix, hold 5-7 days Aspirin Instructions: ASA 81, hold 5 days Last Dose:  Activity level:  Can go up a flight of stairs and perform activities of daily living without stopping and without symptoms of chest pain or shortness of breath.  Able to exercise without symptoms  Unable to go up a flight of stairs without symptoms of     Anesthesia review: HTN, STEMI, CAD, OSA, fatty liver, thrombocytopenia, a fib  Patient denies shortness of breath, fever, cough and chest pain at PAT appointment  Patient verbalized understanding of instructions that were given to them at the PAT appointment. Patient was also instructed that they will need to review over the PAT instructions again at home before surgery.

## 2023-03-04 NOTE — Patient Instructions (Signed)
SURGICAL WAITING ROOM VISITATION  Patients having surgery or a procedure may have no more than 2 support people in the waiting area - these visitors may rotate.    Children under the age of 108 must have an adult with them who is not the patient.  Due to an increase in RSV and influenza rates and associated hospitalizations, children ages 45 and under may not visit patients in [place].  If the patient needs to stay at the hospital during part of their recovery, the visitor guidelines for inpatient rooms apply. Pre-op nurse will coordinate an appropriate time for 1 support person to accompany patient in pre-op.  This support person may not rotate.    Please refer to the [facility] website for the visitor guidelines for Inpatients (after your surgery is over and you are in a regular room).    Your procedure is scheduled on: 03/11/23   Report to [facility] Main Entrance    Report to admitting at 9:45 AM   Call this number if you have problems the morning of surgery [phone]   Follow a clear liquid diet the day before surgery.  Water Non-Citrus Juices (without pulp, NO RED-Apple, White grape, White cranberry) Black Coffee (NO MILK/CREAM OR CREAMERS, sugar ok)  Clear Tea (NO MILK/CREAM OR CREAMERS, sugar ok) regular and decaf                             Plain Jell-O (NO RED)                                           Fruit ices (not with fruit pulp, NO RED)                                     Popsicles (NO RED)                                                               Sports drinks like Gatorade (NO RED)   Do not drink any liquids :After Midnight.                      If you have questions, please contact your surgeon's office.   FOLLOW BOWEL PREP AND ANY ADDITIONAL PRE OP INSTRUCTIONS YOU RECEIVED FROM YOUR SURGEON'S OFFICE!!!     Oral Hygiene is also important to reduce your risk of infection.                                    Remember - BRUSH YOUR  TEETH THE MORNING OF SURGERY WITH YOUR REGULAR TOOTHPASTE  DENTURES WILL BE REMOVED PRIOR TO SURGERY PLEASE DO NOT APPLY "Poly grip" OR ADHESIVES!!!   Take these medicines the morning of surgery with A SIP OF WATER: Finasteride, Levothyroxine, Metoprolol, Tamsulosin   These are anesthesia recommendations for holding your anticoagulants.  Please contact your prescribing physician to confirm IF it is safe to hold your anticoagulants for  this length of time.   Eliquis Apixaban   72 hours   Xarelto Rivaroxaban   72 hours  Plavix Clopidogrel   120 hours  Pletal Cilostazol   120 hours    DO NOT TAKE ANY ORAL DIABETIC MEDICATIONS DAY OF YOUR SURGERY  How to Manage Your Diabetes Before and After Surgery  Why is it important to control my blood sugar before and after surgery? Improving blood sugar levels before and after surgery helps healing and can limit problems. A way of improving blood sugar control is eating a healthy diet by:  Eating less sugar and carbohydrates  Increasing activity/exercise  Talking with your doctor about reaching your blood sugar goals High blood sugars (greater than 180 mg/dL) can raise your risk of infections and slow your recovery, so you will need to focus on controlling your diabetes during the weeks before surgery. Make sure that the doctor who takes care of your diabetes knows about your planned surgery including the date and location.  How do I manage my blood sugar before surgery? Check your blood sugar at least 4 times a day, starting 2 days before surgery, to make sure that the level is not too high or low. Check your blood sugar the morning of your surgery when you wake up and every 2 hours until you get to the Short Stay unit. If your blood sugar is less than 70 mg/dL, you will need to treat for low blood sugar: Do not take insulin. Treat a low blood sugar (less than 70 mg/dL) with  cup of clear juice (cranberry or apple), 4 glucose tablets, OR glucose  gel. Recheck blood sugar in 15 minutes after treatment (to make sure it is greater than 70 mg/dL). If your blood sugar is not greater than 70 mg/dL on recheck, call [phone] for further instructions. Report your blood sugar to the short stay nurse when you get to Short Stay.  If you are admitted to the hospital after surgery: Your blood sugar will be checked by the staff and you will probably be given insulin after surgery (instead of oral diabetes medicines) to make sure you have good blood sugar levels. The goal for blood sugar control after surgery is 80-180 mg/dL.   WHAT DO I DO ABOUT MY DIABETES MEDICATION?  Do not take oral diabetes medicines (pills) the morning of surgery.  THE DAY BEFORE SURGERY, take Metformin as prescribed.     THE MORNING OF SURGERY, do not take Metformin   DO NOT TAKE THE FOLLOWING 7 DAYS PRIOR TO SURGERY: Ozempic, Wegovy, Rybelsus (Semaglutide), Byetta (exenatide), Bydureon (exenatide ER), Victoza, Saxenda (liraglutide), or Trulicity (dulaglutide) Mounjaro (Tirzepatide) Adlyxin (Lixisenatide), Polyethylene Glycol Loxenatide.  Reviewed and Endorsed by [facility] Patient Education Committee, August 2015  Bring BiPAP mask and tubing day of surgery.                              You may not have any metal on your body including hair pins, jewelry, and body piercing             Do not wear make-up, lotions, powders, perfumes/cologne, or deodorant  Do not shave  48 hours prior to surgery.               Men may shave face and neck.   Do not bring valuables to the hospital. [facility] IS NOT  RESPONSIBLE   FOR VALUABLES.   Contacts, glasses, dentures or bridgework may not be worn into surgery.   Bring small overnight bag day of surgery.   DO NOT [place]. PHARMACY WILL DISPENSE MEDICATIONS LISTED ON YOUR MEDICATION LIST TO YOU DURING YOUR ADMISSION [place]!              Please read over the  following fact sheets you were given: IF [place] [id number] Schneiders    If you received a COVID test during your pre-op visit  it is requested that you wear a mask when out in public, stay away from anyone that may not be feeling well and notify your surgeon if you develop symptoms. If you test positive for Covid or have been in contact with anyone that has tested positive in the last 10 days please notify you surgeon.    [facility] - Preparing for Surgery Before surgery, you can play an important role.  Because skin is not sterile, your skin needs to be as free of germs as possible.  You can reduce the number of germs on your skin by washing with CHG (chlorahexidine gluconate) soap before surgery.  CHG is an antiseptic cleaner which kills germs and bonds with the skin to continue killing germs even after washing. Please DO NOT use if you have an allergy to CHG or antibacterial soaps.  If your skin becomes reddened/irritated stop using the CHG and inform your nurse when you arrive at Short Stay. Do not shave (including legs and underarms) for at least 48 hours prior to the first CHG shower.  You may shave your face/neck.  Please follow these instructions carefully:  1.  Shower with CHG Soap the night before surgery and the  morning of surgery.  2.  If you choose to wash your hair, wash your hair first as usual with your normal  shampoo.  3.  After you shampoo, rinse your hair and body thoroughly to remove the shampoo.                             4.  Use CHG as you would any other liquid soap.  You can apply chg directly to the skin and wash.  Gently with a scrungie or clean washcloth.  5.  Apply the CHG Soap to your body ONLY FROM THE NECK DOWN.   Do   not use on face/ open                           Wound or open sores. Avoid contact with eyes, ears mouth and   genitals (private parts).                       Wash face,  Genitals (private parts)  with your normal soap.             6.  Wash thoroughly, paying special attention to the area where your    surgery  will be performed.  7.  Thoroughly rinse your body with warm water from the neck down.  8.  DO NOT shower/wash with your normal soap after using and rinsing off the CHG Soap.                9.  Pat yourself dry with a clean towel.  10.  Wear clean pajamas.            11.  Place clean sheets on your bed the night of your first shower and do not  sleep with pets. Day of Surgery : Do not apply any lotions/deodorants the morning of surgery.  Please wear clean clothes to the hospital/surgery center.  FAILURE TO FOLLOW THESE INSTRUCTIONS MAY RESULT IN THE CANCELLATION OF YOUR SURGERY  PATIENT SIGNATURE_________________________________  NURSE SIGNATURE__________________________________  ________________________________________________________________________

## 2023-03-05 ENCOUNTER — Other Ambulatory Visit: Payer: Self-pay

## 2023-03-05 ENCOUNTER — Encounter (HOSPITAL_COMMUNITY)
Admission: RE | Admit: 2023-03-05 | Discharge: 2023-03-05 | Disposition: A | Discharge status: Home / Self Care | Payer: Medicare Other | Source: Ambulatory Visit | Attending: Urology | Admitting: Urology

## 2023-03-05 ENCOUNTER — Encounter (HOSPITAL_COMMUNITY): Payer: Self-pay

## 2023-03-05 VITALS — BP 134/88 | HR 60 | Temp 98.2°F | Resp 14 | Ht 72.0 in | Wt 255.0 lb

## 2023-03-05 DIAGNOSIS — Z01812 Encounter for preprocedural laboratory examination: Secondary | ICD-10-CM | POA: Diagnosis not present

## 2023-03-05 DIAGNOSIS — I2510 Atherosclerotic heart disease of native coronary artery without angina pectoris: Secondary | ICD-10-CM | POA: Insufficient documentation

## 2023-03-05 DIAGNOSIS — I252 Old myocardial infarction: Secondary | ICD-10-CM | POA: Diagnosis not present

## 2023-03-05 DIAGNOSIS — I480 Paroxysmal atrial fibrillation: Secondary | ICD-10-CM | POA: Insufficient documentation

## 2023-03-05 DIAGNOSIS — R7303 Prediabetes: Secondary | ICD-10-CM | POA: Insufficient documentation

## 2023-03-05 DIAGNOSIS — G4733 Obstructive sleep apnea (adult) (pediatric): Secondary | ICD-10-CM | POA: Diagnosis not present

## 2023-03-05 DIAGNOSIS — I10 Essential (primary) hypertension: Secondary | ICD-10-CM | POA: Diagnosis not present

## 2023-03-05 DIAGNOSIS — K429 Umbilical hernia without obstruction or gangrene: Secondary | ICD-10-CM | POA: Diagnosis not present

## 2023-03-05 HISTORY — DX: Pneumonia, unspecified organism: J18.9

## 2023-03-05 LAB — CBC
HCT: 41.9 % (ref 39.0–52.0)
Hemoglobin: 13.7 g/dL (ref 13.0–17.0)
MCH: 29.7 pg (ref 26.0–34.0)
MCHC: 32.7 g/dL (ref 30.0–36.0)
MCV: 90.7 fL (ref 80.0–100.0)
Platelets: 165 10*3/uL (ref 150–400)
RBC: 4.62 MIL/uL (ref 4.22–5.81)
RDW: 12.7 % (ref 11.5–15.5)
WBC: 5.7 10*3/uL (ref 4.0–10.5)
nRBC: 0 % (ref 0.0–0.2)

## 2023-03-05 LAB — GLUCOSE, CAPILLARY: Glucose-Capillary: 113 mg/dL — ABNORMAL HIGH (ref 70–99)

## 2023-03-06 NOTE — Progress Notes (Signed)
Anesthesia Chart Review   Case: [id number] Date/Time: 03/11/23 1145   Procedures:      XI ROBOTIC ASSISTED SIMPLE PROSTATECTOMY - 3 HRS     HERNIA REPAIR UMBILICAL ADULT   Anesthesia type: Choice   Pre-op diagnosis: MASSIVE PROSTATE WITH RETENTION AND UMBILICAL HERNIA   [facility] 03 / [facility]   Surgeons: Alexis Frock, MD       DISCUSSION:72 y.o. never msoker with h/o HTN, CAD inferior STEMI 2020 s/p DES to PDA with residual disease (chronic total occlusion of Cx with collaterals with unsuccessful attempt at opening, mid LAD lesion to be treated medically), remote h/o PAF, OSA on bipap, massive prostate with retention and umbilical hernia scheduled for above procedure 03/11/2023 with Dr. Alexis Frock.   Per cardiology note 02/23/2023, "Preoperative Cardiovascular Risk Assessment: RCRI 0.9% indicating low CV risk. The patient affirms he has been doing well without any new cardiac symptoms. They are able to achieve over 4 METS without cardiac limitations - exercising regularly without angina or dyspnea. Therefore, based on ACC/AHA guidelines, the patient would be at acceptable risk for the planned procedure without further cardiovascular testing. The patient was advised that if he develops new symptoms prior to surgery to contact our office to arrange for a follow-up visit, and he verbalized understanding.   Per my review with Dr. Radford Pax today, patient may hold aspirin and Plavix as requested. Intake stated needing to hold aspirin for 5 days prior to surgery and Plavix 5-7 days prior to surgery, patient/wife confirmed their instructions stated this today. Please resume antiplatelet therapy when felt safe by performing surgeon."  Pt reports last dose of Plavix 03/04/2023.   Anticipate pt can proceed with planned procedure barring acute status change.   VS: BP 134/88   Pulse 60   Temp 36.8 C (Oral)   Resp 14   Ht 6' (1.829 m)   Wt 115.7 kg   SpO2 97%   BMI 34.58 kg/m    PROVIDERS: Binnie Rail, MD is PCP   Cardiologist - Fransico Him, MD  LABS: Labs reviewed: Acceptable for surgery. (all labs ordered are listed, but only abnormal results are displayed)  Labs Reviewed  GLUCOSE, CAPILLARY - Abnormal; Notable for the following components:      Result Value   Glucose-Capillary 113 (*)    All other components within normal limits  CBC     IMAGES:   EKG:   CV: Echo 12/31/2018 Study Conclusions   - Left ventricle: The cavity size was normal. Wall thickness was    increased in a pattern of mild LVH. Systolic function was    vigorous. The estimated ejection fraction was in the range of 65%    to 70%. Wall motion was normal; there were no regional wall    motion abnormalities. Doppler parameters are consistent with    abnormal left ventricular relaxation (grade 1 diastolic    dysfunction).  - Ventricular septum: The contour showed systolic flattening.  - Mitral valve: There was trivial regurgitation.  - Left atrium: The atrium was mildly dilated.   Stress Test 10/06/2018 Blood pressure demonstrated a normal response to exercise. Normal sinus rhythm with no ST segment changes. 7 minutes of exercise, no chest pain Overall low risk exercise treadmill test with no electrocardiographic evidence of ischemia Past Medical History:  Diagnosis Date   Back pain    BPH (benign prostatic hypertrophy)    Chest pain    COLONIC POLYPS, HX OF 2007   clear  colo w/o polyps 04/2015: 29yrfollow up   Constipation    Coronary artery disease    Decreased mobility    due to BL knee and hip replacement   Fatigue    GERD (gastroesophageal reflux disease)    Heartburn    History of kidney stones    passed   Hyperlipidemia    HYPERTENSION    HYPOTHYROIDISM    Joint pain    Myocardial infarction ([place] 12/2018   Inferior STEMI   OA (osteoarthritis)    Knees   OBESITY    OSA treated with BiPAP    PAF (paroxysmal atrial fibrillation) (HCC)    Pneumonia     Pre-diabetes    Shortness of breath on exertion    Umbilical hernia    Ventral hernia     Past Surgical History:  Procedure Laterality Date   arthroscopic knee surgery     (R) 2003 & (L) 2010   CARDIAC CATHETERIZATION     COLONOSCOPY  04/2015   no polyps ([place])   CORONARY/GRAFT ACUTE MI REVASCULARIZATION N/A 12/30/2018   Procedure: Coronary/Graft Acute MI Revascularization;  Surgeon: SBelva Crome MD;  [facility];  Service: Cardiovascular;  Laterality: N/A;   HIP CLOSED REDUCTION Right 03/12/2021   Procedure: CLOSED REDUCTION HIP;  Surgeon: BMelina Schools MD;  [facility];  Service: Orthopedics;  Laterality: Right;   LAMINECTOMY  1991   LEFT HEART CATH AND CORONARY ANGIOGRAPHY N/A 12/30/2018   Procedure: LEFT HEART CATH AND CORONARY ANGIOGRAPHY;  Surgeon: SBelva Crome MD;  [facility];  Service: Cardiovascular;  Laterality: N/A;   TONSILLECTOMY  1990   w/ adenoids and uvula   TONSILLECTOMY     TOTAL HIP ARTHROPLASTY Right 01/2011   [place]   TOTAL HIP ARTHROPLASTY Left 04/16/2016   Procedure: TOTAL HIP ARTHROPLASTY ANTERIOR APPROACH;  Surgeon: FGaynelle Arabian MD;  [facility];  Service: Orthopedics;  Laterality: Left;   TOTAL HIP REVISION Right 01/04/2020   Procedure: Right hip bearing surface;  Surgeon: AGaynelle Arabian MD;  [facility];  Service: Orthopedics;  Laterality: Right;  1220m   TOTAL HIP REVISION Right 04/15/2021   Procedure: Right hip bearing surface revision;  Surgeon: AlGaynelle ArabianMD;  [facility];  Service: Orthopedics;  Laterality: Right;  9055m  TOTAL KNEE ARTHROPLASTY  12/27/2012   Procedure: TOTAL KNEE BILATERAL;  Surgeon: FraGearlean AlfD;  [facility];  Service: Orthopedics;  Laterality: Bilateral;   WISDOM TOOTH EXTRACTION      MEDICATIONS:  aspirin 81 MG EC tablet   atorvastatin (LIPITOR) 80 MG tablet   cholecalciferol (VITAMIN D3) 25 MCG (1000 UNIT) tablet   clopidogrel  (PLAVIX) 75 MG tablet   finasteride (PROSCAR) 5 MG tablet   levothyroxine (SYNTHROID) 137 MCG tablet   metFORMIN (GLUCOPHAGE) 500 MG tablet   metoprolol succinate (TOPROL-XL) 25 MG 24 hr tablet   nitroGLYCERIN (NITROSTAT) 0.4 MG SL tablet   tamsulosin (FLOMAX) 0.4 MG CAPS capsule   telmisartan (MICARDIS) 80 MG tablet   No current facility-administered medications for this encounter.   JesKonrad Felixrd, PA-C [facility]-Surgical Testing ([phone]

## 2023-03-06 NOTE — Anesthesia Preprocedure Evaluation (Signed)
Anesthesia Evaluation  Patient identified by MRN, date of birth, ID band Patient awake    Reviewed: Allergy & Precautions, NPO status , Patient's Chart, lab work & pertinent test results  History of Anesthesia Complications Negative for: history of anesthetic complications  Airway Mallampati: II  TM Distance: >3 FB Neck ROM: Full    Dental  (+) Dental Advisory Given, Teeth Intact   Pulmonary sleep apnea , pneumonia 04/11/2021 SARS coronavirus NEG   Pulmonary exam normal breath sounds clear to auscultation       Cardiovascular hypertension, Pt. on medications and Pt. on home beta blockers (-) angina + CAD, + Past MI and + Cardiac Stents  Normal cardiovascular exam+ dysrhythmias Atrial Fibrillation  Rhythm:Regular Rate:Normal  Echo 12/2018 - Left ventricle: The cavity size was normal. Wall thickness was increased in a pattern of mild LVH. Systolic function was vigorous. The estimated ejection fraction was in the range of 65% to 70%. Wall motion was normal; there were no regional wall motion abnormalities. Doppler parameters are consistent with abnormal left ventricular relaxation (grade 1 diastolic dysfunction).  - Ventricular septum: The contour showed systolic flattening.  - Mitral valve: There was trivial regurgitation.  - Left atrium: The atrium was mildly dilated.    '20 cath:Reduction of 95% PDA  to less than 20% with a 20 x 3.5 mm Synergy postdilated to 4.0 mm, Likely chronic occlusion of the distal circumflex receiving collaterals from the PDA and also from the first obtuse marginal.  Failed PCI on distal circumflex, 70% mid LAD best treated with medical therapy at this time. Normal left main, normal LV function with EF 60%.  Inferobasal hypokinesis      Neuro/Psych  Neuromuscular disease    GI/Hepatic Neg liver ROS,GERD  Controlled,,  Endo/Other  diabetes (glu 115), Oral Hypoglycemic AgentsHypothyroidism  Morbid obesity   Renal/GU negative Renal ROS     Musculoskeletal  (+) Arthritis , Osteoarthritis,    Abdominal  (+) + obese  Peds  Hematology Plavix: last dose 04/10/2021   Anesthesia Other Findings   Reproductive/Obstetrics                             Anesthesia Physical Anesthesia Plan  ASA: 3  Anesthesia Plan: General   Post-op Pain Management: Tylenol PO (pre-op)*   Induction:   PONV Risk Score and Plan: 4 or greater and Ondansetron, Treatment may vary due to age or medical condition and Dexamethasone  Airway Management Planned: Oral ETT  Additional Equipment: ClearSight  Intra-op Plan:   Post-operative Plan: Extubation in OR  Informed Consent: I have reviewed the patients History and Physical, chart, labs and discussed the procedure including the risks, benefits and alternatives for the proposed anesthesia with the patient or authorized representative who has indicated his/her understanding and acceptance.     Dental advisory given  Plan Discussed with: CRNA  Anesthesia Plan Comments: (See PAT note 03/05/2023)        Anesthesia Quick Evaluation

## 2023-03-11 ENCOUNTER — Inpatient Hospital Stay (HOSPITAL_COMMUNITY)
Admission: RE | Admit: 2023-03-11 | Discharge: 2023-03-12 | DRG: 717 | Disposition: A | Discharge status: Home / Self Care | Payer: Medicare Other | Attending: Urology | Admitting: Urology

## 2023-03-11 ENCOUNTER — Inpatient Hospital Stay (HOSPITAL_COMMUNITY): Payer: Medicare Other | Admitting: Physician Assistant

## 2023-03-11 ENCOUNTER — Encounter (HOSPITAL_COMMUNITY): Payer: Self-pay | Admitting: Urology

## 2023-03-11 ENCOUNTER — Encounter (HOSPITAL_COMMUNITY)
Admission: RE | Disposition: A | Discharge status: Home / Self Care | Payer: Self-pay | Source: Home / Self Care | Attending: Urology

## 2023-03-11 ENCOUNTER — Other Ambulatory Visit: Payer: Self-pay

## 2023-03-11 ENCOUNTER — Inpatient Hospital Stay (HOSPITAL_COMMUNITY): Payer: Medicare Other | Admitting: Anesthesiology

## 2023-03-11 DIAGNOSIS — R338 Other retention of urine: Secondary | ICD-10-CM | POA: Diagnosis present

## 2023-03-11 DIAGNOSIS — R7303 Prediabetes: Secondary | ICD-10-CM | POA: Diagnosis present

## 2023-03-11 DIAGNOSIS — G4733 Obstructive sleep apnea (adult) (pediatric): Secondary | ICD-10-CM | POA: Diagnosis present

## 2023-03-11 DIAGNOSIS — N400 Enlarged prostate without lower urinary tract symptoms: Secondary | ICD-10-CM | POA: Diagnosis not present

## 2023-03-11 DIAGNOSIS — K429 Umbilical hernia without obstruction or gangrene: Secondary | ICD-10-CM | POA: Diagnosis present

## 2023-03-11 DIAGNOSIS — I2510 Atherosclerotic heart disease of native coronary artery without angina pectoris: Secondary | ICD-10-CM | POA: Diagnosis present

## 2023-03-11 DIAGNOSIS — I252 Old myocardial infarction: Secondary | ICD-10-CM

## 2023-03-11 DIAGNOSIS — Z833 Family history of diabetes mellitus: Secondary | ICD-10-CM | POA: Diagnosis not present

## 2023-03-11 DIAGNOSIS — N401 Enlarged prostate with lower urinary tract symptoms: Secondary | ICD-10-CM | POA: Diagnosis present

## 2023-03-11 DIAGNOSIS — Z955 Presence of coronary angioplasty implant and graft: Secondary | ICD-10-CM

## 2023-03-11 DIAGNOSIS — E669 Obesity, unspecified: Secondary | ICD-10-CM | POA: Diagnosis present

## 2023-03-11 DIAGNOSIS — Z79899 Other long term (current) drug therapy: Secondary | ICD-10-CM

## 2023-03-11 DIAGNOSIS — Z8261 Family history of arthritis: Secondary | ICD-10-CM

## 2023-03-11 DIAGNOSIS — E785 Hyperlipidemia, unspecified: Secondary | ICD-10-CM | POA: Diagnosis present

## 2023-03-11 DIAGNOSIS — Z83438 Family history of other disorder of lipoprotein metabolism and other lipidemia: Secondary | ICD-10-CM | POA: Diagnosis not present

## 2023-03-11 DIAGNOSIS — Z8249 Family history of ischemic heart disease and other diseases of the circulatory system: Secondary | ICD-10-CM | POA: Diagnosis not present

## 2023-03-11 DIAGNOSIS — N138 Other obstructive and reflux uropathy: Secondary | ICD-10-CM | POA: Diagnosis present

## 2023-03-11 DIAGNOSIS — K4020 Bilateral inguinal hernia, without obstruction or gangrene, not specified as recurrent: Secondary | ICD-10-CM | POA: Diagnosis present

## 2023-03-11 DIAGNOSIS — Z820 Family history of epilepsy and other diseases of the nervous system: Secondary | ICD-10-CM

## 2023-03-11 DIAGNOSIS — K219 Gastro-esophageal reflux disease without esophagitis: Secondary | ICD-10-CM | POA: Diagnosis present

## 2023-03-11 DIAGNOSIS — Z6834 Body mass index (BMI) 34.0-34.9, adult: Secondary | ICD-10-CM | POA: Diagnosis not present

## 2023-03-11 DIAGNOSIS — Z96653 Presence of artificial knee joint, bilateral: Secondary | ICD-10-CM | POA: Diagnosis present

## 2023-03-11 DIAGNOSIS — E039 Hypothyroidism, unspecified: Secondary | ICD-10-CM | POA: Diagnosis present

## 2023-03-11 DIAGNOSIS — I10 Essential (primary) hypertension: Secondary | ICD-10-CM

## 2023-03-11 DIAGNOSIS — Z809 Family history of malignant neoplasm, unspecified: Secondary | ICD-10-CM

## 2023-03-11 DIAGNOSIS — Z8616 Personal history of COVID-19: Secondary | ICD-10-CM | POA: Diagnosis not present

## 2023-03-11 DIAGNOSIS — I480 Paroxysmal atrial fibrillation: Secondary | ICD-10-CM | POA: Diagnosis present

## 2023-03-11 DIAGNOSIS — Z96643 Presence of artificial hip joint, bilateral: Secondary | ICD-10-CM | POA: Diagnosis present

## 2023-03-11 HISTORY — PX: UMBILICAL HERNIA REPAIR: SHX196

## 2023-03-11 HISTORY — PX: XI ROBOTIC ASSISTED SIMPLE PROSTATECTOMY: SHX6713

## 2023-03-11 LAB — HEMOGLOBIN AND HEMATOCRIT, BLOOD
HCT: 40.9 % (ref 39.0–52.0)
Hemoglobin: 13.4 g/dL (ref 13.0–17.0)

## 2023-03-11 LAB — TYPE AND SCREEN
ABO/RH(D): O POS
Antibody Screen: NEGATIVE

## 2023-03-11 LAB — GLUCOSE, CAPILLARY
Glucose-Capillary: 111 mg/dL — ABNORMAL HIGH (ref 70–99)
Glucose-Capillary: 196 mg/dL — ABNORMAL HIGH (ref 70–99)
Glucose-Capillary: 185 mg/dL — ABNORMAL HIGH (ref 70–99)

## 2023-03-11 SURGERY — PROSTATECTOMY, SIMPLE, ROBOT-ASSISTED
Anesthesia: General

## 2023-03-11 MED ORDER — LEVOTHYROXINE SODIUM 25 MCG PO TABS
137.0000 ug | ORAL_TABLET | Freq: Every day | ORAL | Status: DC
  Administered 2023-03-12: 137 ug via ORAL
  Filled 2023-03-11: qty 1

## 2023-03-11 MED ORDER — HYDROMORPHONE HCL 1 MG/ML IJ SOLN
0.5000 mg | INTRAMUSCULAR | Status: DC | PRN
  Administered 2023-03-11 – 2023-03-12 (×2): 1 mg via INTRAVENOUS
  Filled 2023-03-11 (×2): qty 1

## 2023-03-11 MED ORDER — MAGNESIUM CITRATE PO SOLN: 1.0000 | Freq: Once | ORAL | Status: DC

## 2023-03-11 MED ORDER — TRIPLE ANTIBIOTIC 3.5-400-5000 EX OINT: 1.0000 | TOPICAL_OINTMENT | Freq: Three times a day (TID) | CUTANEOUS | Status: DC | PRN

## 2023-03-11 MED ORDER — AMISULPRIDE (ANTIEMETIC) 5 MG/2ML IV SOLN: 10.0000 mg | Freq: Once | INTRAVENOUS | Status: DC | PRN

## 2023-03-11 MED ORDER — ACETAMINOPHEN 500 MG PO TABS
1000.0000 mg | ORAL_TABLET | Freq: Four times a day (QID) | ORAL | Status: DC
  Administered 2023-03-11 – 2023-03-12 (×3): 1000 mg via ORAL
  Filled 2023-03-11 (×3): qty 2

## 2023-03-11 MED ORDER — FENTANYL CITRATE (PF) 100 MCG/2ML IJ SOLN
INTRAMUSCULAR | Status: DC | PRN
  Administered 2023-03-11 (×4): 50 ug via INTRAVENOUS

## 2023-03-11 MED ORDER — LACTATED RINGERS IV SOLN: INTRAVENOUS | Status: DC | PRN

## 2023-03-11 MED ORDER — DEXAMETHASONE SODIUM PHOSPHATE 10 MG/ML IJ SOLN
INTRAMUSCULAR | Status: AC
  Filled 2023-03-11: qty 2

## 2023-03-11 MED ORDER — PROMETHAZINE HCL 25 MG/ML IJ SOLN: 6.2500 mg | INTRAMUSCULAR | Status: DC | PRN

## 2023-03-11 MED ORDER — ROCURONIUM BROMIDE 10 MG/ML (PF) SYRINGE
PREFILLED_SYRINGE | INTRAVENOUS | Status: AC
  Filled 2023-03-11: qty 20

## 2023-03-11 MED ORDER — PROPOFOL 10 MG/ML IV BOLUS
INTRAVENOUS | Status: AC
  Filled 2023-03-11: qty 20

## 2023-03-11 MED ORDER — SODIUM CHLORIDE (PF) 0.9 % IJ SOLN
INTRAMUSCULAR | Status: DC | PRN
  Administered 2023-03-11: 20 mL

## 2023-03-11 MED ORDER — EPHEDRINE 5 MG/ML INJ
INTRAVENOUS | Status: AC
  Filled 2023-03-11: qty 10

## 2023-03-11 MED ORDER — OXYCODONE HCL 5 MG PO TABS: 5.0000 mg | ORAL_TABLET | ORAL | Status: DC | PRN

## 2023-03-11 MED ORDER — FENTANYL CITRATE (PF) 250 MCG/5ML IJ SOLN
INTRAMUSCULAR | Status: DC | PRN
  Administered 2023-03-11 (×3): 50 ug via INTRAVENOUS
  Administered 2023-03-11: 100 ug via INTRAVENOUS

## 2023-03-11 MED ORDER — ROCURONIUM BROMIDE 10 MG/ML (PF) SYRINGE
PREFILLED_SYRINGE | INTRAVENOUS | Status: DC | PRN
  Administered 2023-03-11: 10 mg via INTRAVENOUS
  Administered 2023-03-11: 5 mg via INTRAVENOUS
  Administered 2023-03-11 (×2): 10 mg via INTRAVENOUS
  Administered 2023-03-11: 70 mg via INTRAVENOUS
  Administered 2023-03-11: 20 mg via INTRAVENOUS

## 2023-03-11 MED ORDER — BUPIVACAINE LIPOSOME 1.3 % IJ SUSP
INTRAMUSCULAR | Status: DC | PRN
  Administered 2023-03-11: 20 mL

## 2023-03-11 MED ORDER — MIDAZOLAM HCL 2 MG/2ML IJ SOLN
INTRAMUSCULAR | Status: AC
  Filled 2023-03-11: qty 2

## 2023-03-11 MED ORDER — INSULIN ASPART 100 UNIT/ML IJ SOLN: 0.0000 [IU] | Freq: Every day | INTRAMUSCULAR | Status: DC

## 2023-03-11 MED ORDER — DIPHENHYDRAMINE HCL 12.5 MG/5ML PO ELIX: 12.5000 mg | ORAL_SOLUTION | Freq: Four times a day (QID) | ORAL | Status: DC | PRN

## 2023-03-11 MED ORDER — FINASTERIDE 5 MG PO TABS
5.0000 mg | ORAL_TABLET | Freq: Every day | ORAL | Status: DC
  Administered 2023-03-12: 5 mg via ORAL
  Filled 2023-03-11: qty 1

## 2023-03-11 MED ORDER — SUGAMMADEX SODIUM 500 MG/5ML IV SOLN
INTRAVENOUS | Status: AC
  Filled 2023-03-11: qty 5

## 2023-03-11 MED ORDER — ONDANSETRON HCL 4 MG/2ML IJ SOLN: 4.0000 mg | INTRAMUSCULAR | Status: DC | PRN

## 2023-03-11 MED ORDER — HYDROMORPHONE HCL 1 MG/ML IJ SOLN
INTRAMUSCULAR | Status: AC
  Administered 2023-03-11: 0.5 mg via INTRAVENOUS
  Filled 2023-03-11: qty 2

## 2023-03-11 MED ORDER — LACTATED RINGERS IR SOLN
Status: DC | PRN
  Administered 2023-03-11: 1000 mL

## 2023-03-11 MED ORDER — DIPHENHYDRAMINE HCL 50 MG/ML IJ SOLN: 12.5000 mg | Freq: Four times a day (QID) | INTRAMUSCULAR | Status: DC | PRN

## 2023-03-11 MED ORDER — PHENYLEPHRINE HCL-NACL 20-0.9 MG/250ML-% IV SOLN
INTRAVENOUS | Status: DC | PRN
  Administered 2023-03-11: 25 ug/min via INTRAVENOUS

## 2023-03-11 MED ORDER — ATORVASTATIN CALCIUM 40 MG PO TABS
80.0000 mg | ORAL_TABLET | Freq: Every day | ORAL | Status: DC
  Administered 2023-03-11: 80 mg via ORAL
  Filled 2023-03-11: qty 2

## 2023-03-11 MED ORDER — NITROGLYCERIN 0.4 MG SL SUBL: 0.4000 mg | SUBLINGUAL_TABLET | SUBLINGUAL | Status: DC | PRN

## 2023-03-11 MED ORDER — FENTANYL CITRATE (PF) 250 MCG/5ML IJ SOLN
INTRAMUSCULAR | Status: AC
  Filled 2023-03-11: qty 5

## 2023-03-11 MED ORDER — FENTANYL CITRATE (PF) 100 MCG/2ML IJ SOLN
INTRAMUSCULAR | Status: AC
  Filled 2023-03-11: qty 2

## 2023-03-11 MED ORDER — HYDROMORPHONE HCL 1 MG/ML IJ SOLN
0.2500 mg | INTRAMUSCULAR | Status: DC | PRN
  Administered 2023-03-11 (×3): 0.5 mg via INTRAVENOUS

## 2023-03-11 MED ORDER — PROPOFOL 10 MG/ML IV BOLUS
INTRAVENOUS | Status: DC | PRN
  Administered 2023-03-11: 160 mg via INTRAVENOUS
  Administered 2023-03-11: 40 mg via INTRAVENOUS

## 2023-03-11 MED ORDER — LIDOCAINE 2% (20 MG/ML) 5 ML SYRINGE
INTRAMUSCULAR | Status: DC | PRN
  Administered 2023-03-11: 100 mg via INTRAVENOUS

## 2023-03-11 MED ORDER — HYOSCYAMINE SULFATE 0.125 MG SL SUBL
0.1250 mg | SUBLINGUAL_TABLET | SUBLINGUAL | Status: DC | PRN
  Administered 2023-03-12: 0.125 mg via SUBLINGUAL
  Filled 2023-03-11: qty 1

## 2023-03-11 MED ORDER — PHENYLEPHRINE HCL (PRESSORS) 10 MG/ML IV SOLN
INTRAVENOUS | Status: AC
  Filled 2023-03-11: qty 1

## 2023-03-11 MED ORDER — DOCUSATE SODIUM 100 MG PO CAPS: 100.0000 mg | ORAL_CAPSULE | Freq: Two times a day (BID) | ORAL | Status: DC

## 2023-03-11 MED ORDER — SODIUM CHLORIDE 0.45 % IV SOLN: INTRAVENOUS | Status: DC

## 2023-03-11 MED ORDER — PHENYLEPHRINE 80 MCG/ML (10ML) SYRINGE FOR IV PUSH (FOR BLOOD PRESSURE SUPPORT)
PREFILLED_SYRINGE | INTRAVENOUS | Status: AC
  Filled 2023-03-11: qty 10

## 2023-03-11 MED ORDER — ONDANSETRON HCL 4 MG/2ML IJ SOLN
INTRAMUSCULAR | Status: DC | PRN
  Administered 2023-03-11: 4 mg via INTRAVENOUS

## 2023-03-11 MED ORDER — SODIUM CHLORIDE 0.9 % IV BOLUS
1000.0000 mL | Freq: Once | INTRAVENOUS | Status: AC
  Administered 2023-03-11: 1000 mL via INTRAVENOUS

## 2023-03-11 MED ORDER — INSULIN ASPART 100 UNIT/ML IJ SOLN: 0.0000 [IU] | Freq: Three times a day (TID) | INTRAMUSCULAR | Status: DC

## 2023-03-11 MED ORDER — IRBESARTAN 300 MG PO TABS
300.0000 mg | ORAL_TABLET | Freq: Every day | ORAL | Status: DC
  Administered 2023-03-11 – 2023-03-12 (×2): 300 mg via ORAL
  Filled 2023-03-11 (×2): qty 1

## 2023-03-11 MED ORDER — DEXAMETHASONE SODIUM PHOSPHATE 10 MG/ML IJ SOLN
INTRAMUSCULAR | Status: DC | PRN
  Administered 2023-03-11: 10 mg via INTRAVENOUS

## 2023-03-11 MED ORDER — MEPERIDINE HCL 50 MG/ML IJ SOLN
6.2500 mg | INTRAMUSCULAR | Status: DC | PRN
  Administered 2023-03-11: 12.5 mg via INTRAVENOUS

## 2023-03-11 MED ORDER — CEFAZOLIN SODIUM-DEXTROSE 2-4 GM/100ML-% IV SOLN
2.0000 g | INTRAVENOUS | Status: AC
  Administered 2023-03-11: 2 g via INTRAVENOUS
  Filled 2023-03-11: qty 100

## 2023-03-11 MED ORDER — METOPROLOL SUCCINATE ER 25 MG PO TB24
25.0000 mg | ORAL_TABLET | Freq: Every day | ORAL | Status: DC
  Administered 2023-03-12: 25 mg via ORAL
  Filled 2023-03-11: qty 1

## 2023-03-11 MED ORDER — SULFAMETHOXAZOLE-TRIMETHOPRIM 800-160 MG PO TABS: 1.0000 | ORAL_TABLET | Freq: Two times a day (BID) | ORAL | 0 refills | Status: DC

## 2023-03-11 MED ORDER — SODIUM CHLORIDE (PF) 0.9 % IJ SOLN
INTRAMUSCULAR | Status: AC
  Filled 2023-03-11: qty 20

## 2023-03-11 MED ORDER — SUGAMMADEX SODIUM 500 MG/5ML IV SOLN
INTRAVENOUS | Status: DC | PRN
  Administered 2023-03-11: 400 mg via INTRAVENOUS

## 2023-03-11 MED ORDER — CEFAZOLIN SODIUM 1 G IJ SOLR
INTRAMUSCULAR | Status: AC
  Filled 2023-03-11: qty 20

## 2023-03-11 MED ORDER — BUPIVACAINE LIPOSOME 1.3 % IJ SUSP
INTRAMUSCULAR | Status: AC
  Filled 2023-03-11: qty 20

## 2023-03-11 MED ORDER — MEPERIDINE HCL 50 MG/ML IJ SOLN
INTRAMUSCULAR | Status: AC
  Filled 2023-03-11: qty 1

## 2023-03-11 MED ORDER — LACTATED RINGERS IV SOLN: INTRAVENOUS | Status: DC

## 2023-03-11 MED ORDER — DOCUSATE SODIUM 100 MG PO CAPS
100.0000 mg | ORAL_CAPSULE | Freq: Two times a day (BID) | ORAL | Status: DC
  Administered 2023-03-11 – 2023-03-12 (×2): 100 mg via ORAL
  Filled 2023-03-11 (×2): qty 1

## 2023-03-11 MED ORDER — MIDAZOLAM HCL 2 MG/2ML IJ SOLN
INTRAMUSCULAR | Status: DC | PRN
  Administered 2023-03-11: 2 mg via INTRAVENOUS

## 2023-03-11 MED ORDER — LIDOCAINE HCL (PF) 2 % IJ SOLN
INTRAMUSCULAR | Status: AC
  Filled 2023-03-11: qty 15

## 2023-03-11 MED ORDER — ONDANSETRON HCL 4 MG/2ML IJ SOLN
INTRAMUSCULAR | Status: AC
  Filled 2023-03-11: qty 4

## 2023-03-11 MED ORDER — CHLORHEXIDINE GLUCONATE 0.12 % MT SOLN
15.0000 mL | Freq: Once | OROMUCOSAL | Status: AC
  Administered 2023-03-11: 15 mL via OROMUCOSAL

## 2023-03-11 MED ORDER — HYDROCODONE-ACETAMINOPHEN 5-325 MG PO TABS: 1.0000 | ORAL_TABLET | Freq: Four times a day (QID) | ORAL | 0 refills | Status: DC | PRN

## 2023-03-11 MED ORDER — ORAL CARE MOUTH RINSE: 15.0000 mL | Freq: Once | OROMUCOSAL | Status: AC

## 2023-03-11 SURGICAL SUPPLY — 76 items
ADH SKN CLS APL DERMABOND .7 (GAUZE/BANDAGES/DRESSINGS) ×2
APL PRP STRL LF DISP 70% ISPRP (MISCELLANEOUS) ×2
APL SWBSTK 6 STRL LF DISP (MISCELLANEOUS) ×2
APPLICATOR COTTON TIP 6 STRL (MISCELLANEOUS) ×2 IMPLANT
APPLICATOR COTTON TIP 6IN STRL (MISCELLANEOUS) ×2
BAG COUNTER SPONGE SURGICOUNT (BAG) IMPLANT
BAG SPNG CNTER NS LX DISP (BAG)
CATH FOLEY 2WAY SLVR 18FR 30CC (CATHETERS) ×2 IMPLANT
CATH FOLEY 3WAY 30CC 24FR (CATHETERS) ×2
CATH TIEMANN FOLEY 18FR 5CC (CATHETERS) IMPLANT
CATH URTH STD 24FR FL 3W 2 (CATHETERS) ×2 IMPLANT
CHLORAPREP W/TINT 26 (MISCELLANEOUS) ×2 IMPLANT
CLIP LIGATING HEM O LOK PURPLE (MISCELLANEOUS) IMPLANT
CLOTH BEACON ORANGE TIMEOUT ST (SAFETY) ×2 IMPLANT
COVER SURGICAL LIGHT HANDLE (MISCELLANEOUS) ×2 IMPLANT
COVER TIP SHEARS 8 DVNC (MISCELLANEOUS) ×2 IMPLANT
COVER TIP SHEARS 8MM DA VINCI (MISCELLANEOUS) ×2
CUTTER ECHEON FLEX ENDO 45 340 (ENDOMECHANICALS) IMPLANT
DERMABOND ADVANCED .7 DNX12 (GAUZE/BANDAGES/DRESSINGS) ×2 IMPLANT
DRAIN CHANNEL RND F F (WOUND CARE) IMPLANT
DRAPE ARM DVNC X/XI (DISPOSABLE) ×8 IMPLANT
DRAPE COLUMN DVNC XI (DISPOSABLE) ×2 IMPLANT
DRAPE DA VINCI XI ARM (DISPOSABLE) ×8
DRAPE DA VINCI XI COLUMN (DISPOSABLE) ×2
DRAPE SURG IRRIG POUCH 19X23 (DRAPES) ×2 IMPLANT
DRSG TEGADERM 4X4.75 (GAUZE/BANDAGES/DRESSINGS) ×2 IMPLANT
ELECT PENCIL ROCKER SW 15FT (MISCELLANEOUS) ×2 IMPLANT
ELECT REM PT RETURN 15FT ADLT (MISCELLANEOUS) ×2 IMPLANT
GAUZE SPONGE 4X4 12PLY STRL (GAUZE/BANDAGES/DRESSINGS) ×2 IMPLANT
GLOVE BIO SURGEON STRL SZ 6.5 (GLOVE) ×2 IMPLANT
GLOVE BIOGEL PI IND STRL 7.5 (GLOVE) ×2 IMPLANT
GLOVE SURG LX STRL 7.5 STRW (GLOVE) ×4 IMPLANT
GOWN SRG XL LVL 4 BRTHBL STRL (GOWNS) ×2 IMPLANT
GOWN STRL NON-REIN XL LVL4 (GOWNS) ×2
GOWN STRL REUS W/ TWL XL LVL3 (GOWN DISPOSABLE) ×4 IMPLANT
GOWN STRL REUS W/TWL XL LVL3 (GOWN DISPOSABLE) ×6
HOLDER FOLEY CATH W/STRAP (MISCELLANEOUS) ×2 IMPLANT
IRRIG SUCT STRYKERFLOW 2 WTIP (MISCELLANEOUS) ×2
IRRIGATION SUCT STRKRFLW 2 WTP (MISCELLANEOUS) ×2 IMPLANT
IV LACTATED RINGERS 1000ML (IV SOLUTION) ×2 IMPLANT
KIT TURNOVER KIT A (KITS) IMPLANT
NDL INSUFFLATION 14GA 120MM (NEEDLE) ×2 IMPLANT
NEEDLE INSUFFLATION 14GA 120MM (NEEDLE) ×2 IMPLANT
PACK ROBOT UROLOGY CUSTOM (CUSTOM PROCEDURE TRAY) ×2 IMPLANT
PAD POSITIONING PINK XL (MISCELLANEOUS) ×2 IMPLANT
PORT ACCESS TROCAR AIRSEAL 12 (TROCAR) ×2 IMPLANT
RELOAD STAPLE 45 4.1 GRN THCK (STAPLE) IMPLANT
SEAL CANN UNIV 5-8 DVNC XI (MISCELLANEOUS) ×8 IMPLANT
SEAL XI 5MM-8MM UNIVERSAL (MISCELLANEOUS) ×8
SET TRI-LUMEN FLTR TB AIRSEAL (TUBING) ×2 IMPLANT
SOL ELECTROSURG ANTI STICK (MISCELLANEOUS) ×2
SOLUTION ELECTROSURG ANTI STCK (MISCELLANEOUS) ×2 IMPLANT
SPIKE FLUID TRANSFER (MISCELLANEOUS) ×2 IMPLANT
SPONGE T-LAP 4X18 ~~LOC~~+RFID (SPONGE) IMPLANT
STAPLE RELOAD 45 GRN (STAPLE) ×2 IMPLANT
STAPLE RELOAD 45MM GREEN (STAPLE) ×2
SUT ETHIBOND NAB CT1 #1 30IN (SUTURE) IMPLANT
SUT ETHILON 3 0 PS 1 (SUTURE) ×2 IMPLANT
SUT MNCRL AB 4-0 PS2 18 (SUTURE) ×4 IMPLANT
SUT NOVA 0 T19/GS 22DT (SUTURE) IMPLANT
SUT PDS AB 1 CT1 27 (SUTURE) ×4 IMPLANT
SUT VIC AB 0 CT1 27 (SUTURE) ×8
SUT VIC AB 0 CT1 27XBRD ANTBC (SUTURE) ×6 IMPLANT
SUT VIC AB 2-0 SH 27 (SUTURE) ×2
SUT VIC AB 2-0 SH 27X BRD (SUTURE) ×2 IMPLANT
SUT VIC AB 3-0 SH 27 (SUTURE) ×2
SUT VIC AB 3-0 SH 27XBRD (SUTURE) IMPLANT
SUT VICRYL 0 UR6 27IN ABS (SUTURE) ×2 IMPLANT
SUT VLOC 180 2-0 9IN GS21 (SUTURE) ×4 IMPLANT
SUT VLOC BARB 180 ABS3/0GR12 (SUTURE) ×4
SUTURE VLOC BRB 180 ABS3/0GR12 (SUTURE) ×4 IMPLANT
SYS BAG RETRIEVAL 10MM (BASKET) ×2
SYSTEM BAG RETRIEVAL 10MM (BASKET) ×2 IMPLANT
TROCAR Z-THREAD FIOS 5X100MM (TROCAR) IMPLANT
TROCAR Z-THREAD OPTICAL 5X100M (TROCAR) IMPLANT
WATER STERILE IRR 1000ML POUR (IV SOLUTION) ×2 IMPLANT

## 2023-03-11 NOTE — H&P (Signed)
[name] is an 72 y.o. male.    Chief Complaint: Pre-Op Simple Prostatectomy / Umbilical Hernia Repair  HPI:   1 -  Lower Urinary Tract Symptoms / Urinary Retention - s/p Urolift by [place] 2019. On tamsulosin daily. Develped frank retention early 2024 on facation and failed trial of void x several. Some hematuria as expected on blood thinners. Protate vol 145m 2021 on MRI with small median. Had acceptable PVR's previously.   2 - Small Umbilcal Hernias - abtou 1.5cm umbilcal defecton on exam 2024. No h/o strangulation.   PMH sig for CAD/Stent/Plavix (Follows TFransico HimMD cards), bilateral hip and knee replacement, large obesity. He is personal friends with [facility]. His wife BHassan Rowanis retired RTherapist, sportswho worked previously at [employer] [place] His PCP is VGwendolyn GrantMD.   Today "[name] is seen to proceed with simple prostatectomy / umbilical hernia repair. No interval fevers. Has been on bactrim pre-op to reduce urinary colonization. Most recent Cr 0.8, Hgb 14, A1c <6. Cards clearance on file, has held ASA and plavix.   Past Medical History:  Diagnosis Date   Back pain    BPH (benign prostatic hypertrophy)    Chest pain    COLONIC POLYPS, HX OF 2007   clear colo w/o polyps 04/2015: 131yrollow up   Constipation    Coronary artery disease    Decreased mobility    due to BL knee and hip replacement   Fatigue    GERD (gastroesophageal reflux disease)    Heartburn    History of kidney stones    passed   Hyperlipidemia    HYPERTENSION    HYPOTHYROIDISM    Joint pain    [place]ocardial infarction ([facility]   Inferior STEMI   OA (osteoarthritis)    Knees   OBESITY    OSA treated with BiPAP    PAF (paroxysmal atrial fibrillation) (HCC)    Pneumonia    Pre-diabetes    Shortness of breath on exertion    Umbilical hernia    Ventral hernia     Past Surgical History:  Procedure Laterality Date    arthroscopic knee surgery     (R) 2003 & (L) 2010   CARDIAC CATHETERIZATION     COLONOSCOPY  04/2015   no polyps ([place])   CORONARY/GRAFT ACUTE MI REVASCULARIZATION N/A 12/30/2018   Procedure: Coronary/Graft Acute MI Revascularization;  Surgeon: SmBelva CromeMD;  [facility];  Service: Cardiovascular;  Laterality: N/A;   HIP CLOSED REDUCTION Right 03/12/2021   Procedure: CLOSED REDUCTION HIP;  Surgeon: BrMelina SchoolsMD;  [facility];  Service: Orthopedics;  Laterality: Right;   LAMINECTOMY  1991   LEFT HEART CATH AND CORONARY ANGIOGRAPHY N/A 12/30/2018   Procedure: LEFT HEART CATH AND CORONARY ANGIOGRAPHY;  Surgeon: SmBelva CromeMD;  [facility];  Service: Cardiovascular;  Laterality: N/A;   TONSILLECTOMY  1990   w/ adenoids and uvula   TONSILLECTOMY     TOTAL HIP ARTHROPLASTY Right 01/2011   [place]   TOTAL HIP ARTHROPLASTY Left 04/16/2016   Procedure: TOTAL HIP ARTHROPLASTY ANTERIOR APPROACH;  Surgeon: FrGaynelle ArabianMD;  [facility];  Service: Orthopedics;  Laterality: Left;   TOTAL HIP REVISION Right 01/04/2020   Procedure: Right hip bearing surface;  Surgeon: AlGaynelle ArabianMD;  [facility];  Service: Orthopedics;  Laterality: Right;  12065m  TOTAL HIP REVISION Right 04/15/2021   Procedure: Right hip bearing surface revision;  Surgeon: Gaynelle Arabian, MD;  [facility];  Service: Orthopedics;  Laterality: Right;  49mn   TOTAL KNEE ARTHROPLASTY  12/27/2012   Procedure: TOTAL KNEE BILATERAL;  Surgeon: FGearlean Alf MD;  [facility];  Service: Orthopedics;  Laterality: Bilateral;   WISDOM TOOTH EXTRACTION      Family History  Problem Relation Age of Onset   Heart disease Father 711      CABG age 70  Hypertension Father    Diabetes Father    Hyperlipidemia Father    Cancer Father    Dementia Mother        ?ALS vs SNP   ALS Maternal Grandfather    Arthritis Other        Parent & grandparents   Colon  cancer Neg Hx    Social History:  reports that he has never smoked. He has never used smokeless tobacco. He reports that he does not currently use alcohol. He reports that he does not use drugs.  Allergies: No Known Allergies  No medications prior to admission.    No results found for this or any previous visit (from the past 48 hour(s)). No results found.  Review of Systems  Constitutional:  Negative for chills and fever.  All other systems reviewed and are negative.   There were no vitals taken for this visit. Physical Exam Vitals reviewed.  HENT:     Head: Normocephalic.  Eyes:     Pupils: Pupils are equal, round, and reactive to light.  Cardiovascular:     Rate and Rhythm: Normal rate.  Abdominal:     General: Abdomen is flat.     Comments: Stable significant truncal obesity  Genitourinary:    Comments: Catheter in situ with non-foul urine Musculoskeletal:        General: Normal range of motion.     Cervical back: Normal range of motion.  Neurological:     General: No focal deficit present.     Mental Status: He is alert.      Assessment/Plan  Proceed as planned with robotic simple prostatectomy / umbilical hernia repair. Risks, benefits, alternatives, expected peri-op course discussed previousliy and reiterated today.   TAlexis Frock MD 03/11/2023, 6:52 AM

## 2023-03-11 NOTE — Brief Op Note (Signed)
03/11/2023  3:39 PM  PATIENT:  [name]  72 y.o. male  PRE-OPERATIVE DIAGNOSIS:  MASSIVE PROSTATE WITH RETENTION AND UMBILICAL HERNIA  POST-OPERATIVE DIAGNOSIS:  MASSIVE PROSTATE WITH RETENTION AND UMBILICAL HERNIA  PROCEDURE:  Procedure(s) with comments: XI ROBOTIC ASSISTED SIMPLE PROSTATECTOMY (N/A) - 3 HRS HERNIA REPAIR UMBILICAL ADULT (N/A) XI ROBOTIC ASSISTED BILATERAL INGUINAL HERNIA (Bilateral)  SURGEON:  Surgeon(s) and Role:    Alexis Frock, MD - Primary  PHYSICIAN ASSISTANT:   ASSISTANTS: Clemetine Marker PA   ANESTHESIA:   local and general  EBL:  250 mL   BLOOD ADMINISTERED:none  DRAINS:  1 - JP to bulb; 2 - Foley to gravity    LOCAL MEDICATIONS USED:  MARCAINE     SPECIMEN:  Source of Specimen:  prostate adenoma  DISPOSITION OF SPECIMEN:  PATHOLOGY  COUNTS:  YES  TOURNIQUET:  * No tourniquets in log *  DICTATION: .Other Dictation: Dictation Number [id number]  PLAN OF CARE: Admit to inpatient   PATIENT DISPOSITION:  PACU - hemodynamically stable.   Delay start of Pharmacological VTE agent (>24hrs) due to surgical blood loss or risk of bleeding: yes

## 2023-03-11 NOTE — Anesthesia Postprocedure Evaluation (Signed)
Anesthesia Post Note  Patient: [name]  Procedure(s) Performed: XI ROBOTIC ASSISTED SIMPLE PROSTATECTOMY HERNIA REPAIR UMBILICAL ADULT XI ROBOTIC ASSISTED BILATERAL INGUINAL HERNIA (Bilateral)     Patient location during evaluation: PACU Anesthesia Type: General Level of consciousness: sedated and patient cooperative Pain management: pain level controlled Vital Signs Assessment: post-procedure vital signs reviewed and stable Respiratory status: spontaneous breathing Cardiovascular status: stable Anesthetic complications: no   No notable events documented.  Last Vitals:  Vitals:   03/11/23 1830 03/11/23 1945  BP: (!) 156/84 (!) 154/73  Pulse: 80 68  Resp: 18   Temp: (!) 38.2 C 37.2 C  SpO2: 96% 93%    Last Pain:  Vitals:   03/11/23 1945  TempSrc: Oral  PainSc:                  Nolon Nations

## 2023-03-11 NOTE — Transfer of Care (Signed)
Immediate Anesthesia Transfer of Care Note  Patient: [name]  Procedure(s) Performed: XI ROBOTIC ASSISTED SIMPLE PROSTATECTOMY HERNIA REPAIR UMBILICAL ADULT XI ROBOTIC ASSISTED BILATERAL INGUINAL HERNIA (Bilateral)  Patient Location: PACU  Anesthesia Type:General  Level of Consciousness: awake  Airway & Oxygen Therapy: Patient Spontanous Breathing and Patient connected to face mask oxygen  Post-op Assessment: Report given to RN and Post -op Vital signs reviewed and stable  Post vital signs: Reviewed and stable  Last Vitals:  Vitals Value Taken Time  BP 176/84 03/11/23 1555  Temp    Pulse 70 03/11/23 1557  Resp 16 03/11/23 1557  SpO2 100 % 03/11/23 1557  Vitals shown include unvalidated device data.  Last Pain:  Vitals:   03/11/23 0958  TempSrc:   PainSc: 0-No pain         Complications: No notable events documented.

## 2023-03-11 NOTE — Op Note (Signed)
[name], [name] MEDICAL RECORD NO: [id number] ACCOUNT NO: [id number] DATE OF BIRTH: 10/21/1951 FACILITY: [facility]ATION: [id number] PHYSICIAN: Alexis Frock, MD  Operative Report   DATE OF PROCEDURE: 03/11/2023  PREOPERATIVE DIAGNOSIS:  Very large prostate with recurrent urinary retention and umbilical hernia.  POSTOPERATIVE DIAGNOSIS:  Very large prostate with recurrent urinary retention and umbilical hernia plus bilateral inguinal hernias.  PROCEDURES:   1.  Robotic-assisted laparoscopic simple prostatectomy. 2.  Laparoscopic bilateral inguinal hernia repair. 3.  Open umbilical hernia repair.  ESTIMATED BLOOD LOSS:  250 mL.  COMPLICATIONS:  None.  SPECIMEN:  Prostate adenoma for permanent pathology.  FINDINGS:   1.  Small umbilical hernia approximately 1 cm fascial defect. 2.  Left greater than right indirect inguinal hernias. Left fascial defect approximately 3 cm, right fascial defect approximately 2 cm. 3.  Predominantly bilobar prostatic hypertrophy, evidence of prior UroLift.  INDICATIONS:  This is a 72 year old man with longstanding history of obstructive voiding due to very large prostate.  He is status post outlet procedure with UroLift years ago, which helped the symptoms somewhat for a period of time.  He has been on  medical therapy as well.  He unfortunately developed a frank urinary retention while on vacation and has been temporized with a catheters at that time.  He has been on maximal medical therapy and failed trial of void times several.  Previously, he is  noted to have normal bladder contractility. Prostate volume maybe 140 grams based on prior imaging.  Options discussed for management including continued medical therapy versus self catheterization versus outlet procedures with his prostate greater than  100 grams, simple prostatectomy being the ideal modalities and he wished to proceed.  He does have a small umbilical hernia as well and wishes to have this  repaired concomitantly.  Informed consent was obtained and placed in medical record.  PROCEDURE DETAILS:  The patient being identified and verified, procedure being robotic simple prostatectomy with umbilical hernia repair was confirmed.  Procedure timeout was performed.  Intravenous administered.  General endotracheal anesthesia induced.   The patient was placed into a low lithotomy position.  Sterile field was created, prepped and draped the patient's penis, perineum, and proximal thighs using iodine after his in situ Foley catheter was removed and his infra-xiphoid abdomen using  chlorhexidine gluconate.  He was further fastened to operating table using 3-inch tape over foam padding across supraxiphoid chest.  Arms were tucked to the side with gel rolls.  He was placed in steep Trendelenburg positioning found to be suitably  positioned.  Next, a high flow, low pressure, pneumoperitoneum was obtained with Veress technique in the supraumbilical midline having passed the aspiration drop test.  An 8 mm robotic camera port was then placed in the same location.  Laparoscopic  examination of peritoneal cavity no significant adhesions, no visceral injury.  There was significant amount of intraabdominal adiposity as anticipated.  Additional ports were placed follows:  Right paramedian 8 mm robotic port, right far lateral 12 mm  AirSeal assist port, right paramedian 5 mm suction port, left paramedian 8 mm robotic port, left far lateral 8 mm robotic port.  Robot was docked and passed the electronic checks.  Some loose colonic adhesions from the sigmoid in the low pelvis were taken  down, allowing this to fall away making more room in the pelvis.  The space of Retzius was developed by incising lateral to the right median umbilical ligament from the midline towards the internal ring and a  mirror image dissection on the left side.   Anterior attachments were taken down with cautery scissors to expose the anterior base  of the prostate, which was defatted to better denote the bladder, prostate junction.  Next, the endopelvic fascia was carefully swept away from the apical aspect of  the prostate just enough to expose the dorsal venous complex was controlled using green load stapler, which resulted in excellent hemostatic control without membranous or urethral injury. During the development of space of Retzius it became immediately  evident.  The patient actually also had significant bilateral indirect inguinal hernias, left greater than right.  The left fascial defect approximately 3 cm, right fascial defect approximately 2 cm.  I was quite concerned about possible future bowel  strangulation in the future.  It was clearly indicated and this was addressed later in the procedure.  Next, the bladder neck was identified moving the Foley catheter back and forth and an inverted U cystotomy was made approximately 2 cm proximal to this  via the true bladder neck for approximately 60% circumference of the bladder, allowing the bladder to fall posteriorly and self retract. The very large bilobar hypertrophy was appreciated.  There is a small median lobe component noted.  Next, 0 Vicryl  stay sutures were placed in the right lobe, left lobe and median lobes respectively in figure-of-eight fashion to be used for traction on the adenoma. The trigonal ridge was identified in the ureteral orifices and the posterior dissection was performed  by incising approximately 2 cm distal to the trigonal ridge creating a mucosal flap posteriorly.  The adenoma plane was entered and then carried all the way towards the area of the prostate apex and the inferior plane as verified by the prostate, curving  back anteriorly.  This was then developed laterally to the 3 o'clock and 9 o'clock positions respectively.  The left lobe was then placed on traction and then swept away from the left prostatic capsule in a posterior to anterior direction from the 9   o'clock to the 12 o'clock position respectively and a mirror image dissection was performed on the right side.  The adenoma was placed on maximal superior traction and then a butterfly type incision was made at the area of the anterior commissure of the  adenoma to the prostatic apex to better visualize the area of the true apex, which was released from the membranous urethra using cautery scissors.  This completely freed up the very large adenoma specimen was placed in EndoCatch bag for later retrieval.   The prostate fossa was carefully inspected circumferentially.  No evidence of capsular violation.  Additional punctate hemostasis was achieved with point coagulation current.  Next, attention was directed at mucosal advancement, 3-0 double arm V-Loc  suture was used to reapproximate the posterior bladder neck to the posterior membranous urethra performing essentially 50% posterior circumference anastomosis between the true bladder neck mucosa and the membranous urethra.  This resulted in excellent  mucosal coverage of the posterior prostate fossa and ureteral orifices were made visibly patent of copious urine.  Next, the cystotomy was reapproximated using two separate running suture lines of 2-0 V-Loc meeting at 12 o'clock position fashioned to  each other.  A new 3-way type Foley catheter was then placed per urethra.  30 mL total water in the balloon, irrigated quantitatively.  I was quite happy with the extirpated portions of the simple prostatectomy. The area of the inguinal ring was once  again inspected.  Again, there  is left greater than right indirect inguinal hernias noted and these were of a size that was quite concerned about possible strangulation especially of the small bowel.  Therefore, laparoscopic repair was performed first on  the left side, reapproximating the pectinate line to the true fascia of the internal ring with figure-of-eight Ethibond x3.  This resulted in excellent resolution  of the hernia defect, such that it would not be possible for mallet to traverse the  hernia.  A mirror image hernia repair was performed on the right side using figure-of-eight Ethibond x2 reapproximating the pectinate line to the internal ring.  Again, this completely resolved the hernia defect on the right side.  I was quite happy with  this portion as well.  Next, a closed suction drain was brought out the previous left lateral most robotic port site into the peritoneal cavity.  Robot was then undocked.  The previous right lateral assistant port site was closed at the level of the  fascia using Carter-Thomason suture passer and 0 Vicryl.  Specimen was retrieved by extending the previous camera port site inferiorly for a distance of approximately 6 cm in total.  Purposely entering the small umbilical hernia.  Hernia sac was  dissected away from the hernia defect.  Hernia defect was circumferentially visualized the level of fascia.  The fascia was reapproximated using interrupted Novafil x10.  Thus performing hernia repair.  Second layer of Scarpa's was reapproximated using  running Vicryl.  All incision sites were infiltrated with dilute lipolyzed Marcaine and closed at the level of the skin using subcuticular Monocryl followed by Dermabond.  Procedure was terminated.  The patient tolerated the procedure well, no immediate  perioperative complications.  The patient taken to the postanesthesia care in stable condition.  Please note, first assistant, Debbrah Alar, was crucial for all portions of the surgery today.  She provided invaluable retraction, suctioning, vascular stapling, vascular clipping, robotic instrument exchange and general first assistance.   PUS D: 03/11/2023 3:49:34 pm T: 03/11/2023 6:43:00 pm  JOB: [id number]

## 2023-03-11 NOTE — Discharge Instructions (Signed)
Activity:  You are encouraged to ambulate frequently (about every hour during waking hours) to help prevent blood clots from forming in your legs or lungs.  However, you should not engage in any heavy lifting (> 10-15 lbs), strenuous activity, or straining. Diet: You should continue a clear liquid diet until passing gas from below.  Once this occurs, you may advance your diet to a soft diet that would be easy to digest (i.e soups, scrambled eggs, mashed potatoes, etc.) for 24 hours just as you would if getting over a bad stomach flu.  If tolerating this diet well for 24 hours, you may then begin eating regular food.  It will be normal to have some amount of bloating, nausea, and abdominal discomfort intermittently. Prescriptions:  You will be provided a prescription for pain medication to take as needed.  If your pain is not severe enough to require the prescription pain medication, you may take Tylenol instead.  You should also take an over the counter stool softener (Colace 100 mg twice daily) to avoid straining with bowel movements as the pain medication may constipate you. Finally, you will also be provided a prescription for an antibiotic to begin the day prior to your return visit in the office for catheter removal. Catheter care: You will be taught how to take care of the catheter by the nursing staff prior to discharge from the hospital.  You may use both a leg bag and the larger bedside bag but it is recommended to at least use the bigger bedside bag at nighttime as the leg bag is small and will fill up overnight and also does not drain as well when lying flat. You may periodically feel a strong urge to void with the catheter in place.  This is a bladder spasm and most often can occur when having a bowel movement or when you are moving around. It is typically self-limited and usually will stop after a few minutes.  You may use some Vaseline or Neosporin around the tip of the catheter to reduce friction  at the tip of the penis. Incisions: You may remove your dressing bandages the 2nd day after surgery.  You most likely will have a few small staples in each of the incisions and once the bandages are removed, the incisions may stay open to air.  You may start showering (not soaking or bathing in water) 48 hours after surgery and the incisions simply need to be patted dry after the shower.  No additional care is needed. What to call us about: You should call the office [phone]) if you develop fever > 101, persistent vomiting, or the catheter stops draining. Also, feel free to call with any other questions you may have and remember the handout that was provided to you as a reference preoperatively which answers many of the common questions that arise after surgery. You may resume aspirin, advil, aleve, vitamins, and supplements 7 days after surgery.  You may resume Plavix 3 days after surgery.

## 2023-03-11 NOTE — Anesthesia Procedure Notes (Signed)
Procedure Name: Intubation Date/Time: 03/11/2023 12:24 PM  Performed by: Eben Burow, CRNAPre-anesthesia Checklist: Patient identified, Emergency Drugs available, Suction available, Patient being monitored and Timeout performed Patient Re-evaluated:Patient Re-evaluated prior to induction Oxygen Delivery Method: Circle system utilized Preoxygenation: Pre-oxygenation with 100% oxygen Induction Type: IV induction Ventilation: Mask ventilation without difficulty Laryngoscope Size: Mac and 4 Grade View: Grade I Tube type: Oral Tube size: 7.5 mm Number of attempts: 1 Airway Equipment and Method: Stylet Placement Confirmation: ETT inserted through vocal cords under direct vision, positive ETCO2 and breath sounds checked- equal and bilateral Secured at: 23 cm Tube secured with: Tape Dental Injury: Teeth and Oropharynx as per pre-operative assessment

## 2023-03-12 ENCOUNTER — Encounter (HOSPITAL_COMMUNITY): Payer: Self-pay | Admitting: Urology

## 2023-03-12 DIAGNOSIS — N401 Enlarged prostate with lower urinary tract symptoms: Secondary | ICD-10-CM | POA: Diagnosis not present

## 2023-03-12 LAB — HEMOGLOBIN AND HEMATOCRIT, BLOOD
HCT: 36.8 % — ABNORMAL LOW (ref 39.0–52.0)
Hemoglobin: 12.2 g/dL — ABNORMAL LOW (ref 13.0–17.0)

## 2023-03-12 LAB — BASIC METABOLIC PANEL
Anion gap: 8 (ref 5–15)
BUN: 14 mg/dL (ref 8–23)
CO2: 26 mmol/L (ref 22–32)
Calcium: 8.4 mg/dL — ABNORMAL LOW (ref 8.9–10.3)
Chloride: 103 mmol/L (ref 98–111)
Creatinine, Ser: 1.06 mg/dL (ref 0.61–1.24)
GFR, Estimated: >60 mL/min (ref >60)
Glucose, Bld: 141 mg/dL — ABNORMAL HIGH (ref 70–99)
Potassium: 4.8 mmol/L (ref 3.5–5.1)
Sodium: 137 mmol/L (ref 135–145)

## 2023-03-12 LAB — GLUCOSE, CAPILLARY: Glucose-Capillary: 159 mg/dL — ABNORMAL HIGH (ref 70–99)

## 2023-03-12 MED ORDER — HYDROMORPHONE HCL 1 MG/ML IJ SOLN: 0.5000 mg | INTRAMUSCULAR | Status: DC | PRN

## 2023-03-12 MED ORDER — OXYCODONE HCL 5 MG PO TABS: 5.0000 mg | ORAL_TABLET | ORAL | Status: DC | PRN

## 2023-03-12 MED ORDER — OXYCODONE HCL 5 MG PO TABS
10.0000 mg | ORAL_TABLET | ORAL | Status: DC | PRN
  Administered 2023-03-12: 10 mg via ORAL
  Filled 2023-03-12: qty 2

## 2023-03-12 NOTE — Discharge Summary (Signed)
[facility] Discharge Summary  Admit date: 03/11/2023  Discharge date and time: 03/12/23   Discharge to: Home  Discharge Service: Urology  Discharge Attending Physician:  Dr. Tresa Moore, MD  Discharge  Diagnoses: BPH with obstruction/lower urinary tract symptoms  Secondary Diagnosis: Principal Problem:   BPH with obstruction/lower urinary tract symptoms   OR Procedures: Procedure(s): XI ROBOTIC ASSISTED SIMPLE PROSTATECTOMY HERNIA REPAIR UMBILICAL ADULT XI ROBOTIC ASSISTED BILATERAL INGUINAL HERNIA 03/11/2023   Ancillary Procedures: None   Discharge Day Services: The patient was seen and examined by the Urology team both in the morning and immediately prior to discharge.  Vital signs and laboratory values were stable and within normal limits.  The physical exam was benign and unchanged and all surgical wounds were examined.  Discharge instructions were explained and all questions answered.  Subjective  No acute events overnight. Pain Controlled. No fever or chills.  Objective Patient Vitals for the past 8 hrs:  BP Temp Temp src Pulse Resp SpO2  03/12/23 0646 138/74 98.9 F (37.2 C) Oral 64 20 96 %   No intake/output data recorded.  General Appearance:        No acute distress Lungs:                       Normal work of breathing on room air Heart:                                Regular rate and rhythm Abdomen:                         Soft, appropriately-tender, non-distended, incisions c/d/I with surrounding bruising Extremities:                      Warm and well perfused   Hospital Course:  The patient underwent robotic simple prostatectomy with catheter placement on 03/11/2023.  The patient tolerated the procedure well, was extubated in the OR, and afterwards was taken to the PACU for routine post-surgical care. When stable the patient was transferred to the floor.   The patient did well postoperatively.  The patient's diet was slowly advanced and at the time of  discharge was tolerating a regular diet. His JP drain had low output and was removed on POD1. The patient was discharged home 1 Day Post-Op, at which point was tolerating a regular solid diet, was able to make adequate urine, have adequate pain control with P.O. pain medication, and could ambulate without difficulty. The patient will follow up with [place] for post op check and catheter removal.   Condition at Discharge: Improved  Discharge Medications:  Allergies as of 03/12/2023   No Known Allergies      Medication List     STOP taking these medications    aspirin EC 81 MG tablet   cholecalciferol 25 MCG (1000 UNIT) tablet Commonly known as: VITAMIN D3   clopidogrel 75 MG tablet Commonly known as: PLAVIX   tamsulosin 0.4 MG Caps capsule Commonly known as: FLOMAX       TAKE these medications    atorvastatin 80 MG tablet Commonly known as: LIPITOR TAKE 1 TABLET BY MOUTH DAILY AT 6 PM.   docusate sodium 100 MG capsule Commonly known as: COLACE Take 1 capsule (100 mg total) by mouth 2 (two) times daily.   finasteride 5 MG tablet Commonly known as: PROSCAR Take  5 mg by mouth daily.   HYDROcodone-acetaminophen 5-325 MG tablet Commonly known as: Norco Take 1-2 tablets by mouth every 6 (six) hours as needed for moderate pain or severe pain.   levothyroxine 137 MCG tablet Commonly known as: SYNTHROID Take 1 tablet (137 mcg total) by mouth daily before breakfast.   metFORMIN 500 MG tablet Commonly known as: GLUCOPHAGE Take 2 tablets (1,000 mg total) by mouth daily with breakfast.   metoprolol succinate 25 MG 24 hr tablet Commonly known as: TOPROL-XL TAKE 1 TABLET (25 MG TOTAL) BY MOUTH DAILY.   nitroGLYCERIN 0.4 MG SL tablet Commonly known as: NITROSTAT PLACE 1 TABLET (0.4 MG TOTAL) UNDER THE TONGUE EVERY 5 (FIVE) MINUTES AS NEEDED FOR CHEST PAIN.   sulfamethoxazole-trimethoprim 800-160 MG tablet Commonly known as: BACTRIM DS Take 1 tablet by mouth 2 (two) times  daily. Start the day prior to foley removal appointment   telmisartan 80 MG tablet Commonly known as: MICARDIS TAKE 1 TABLET BY MOUTH EVERY DAY

## 2023-03-12 NOTE — Progress Notes (Signed)
Pt discharged to home. Prior to discharge, IV was removed. Pt was given dc instructions regarding medications, appointments, and care. Pt verbalized understanding along with wife and both stated no other concerns at this time. Pt stable at time of DC and left in vehicle driven by wife.

## 2023-03-12 NOTE — Progress Notes (Cosign Needed)
  Transition of Care [facility]) Screening Note   Patient Details  Name: [name] Date of Birth: 19-Jul-1951   Transition of Care [facility]) CM/SW Contact:    Henrietta Dine, RN Phone Number: 03/12/2023, 9:52 AM    Transition of Care Department [facility]) has reviewed patient and no TOC needs have been identified at this time. We will continue to monitor patient advancement through interdisciplinary progression rounds. If new patient transition needs arise, please place a TOC consult.

## 2023-03-13 ENCOUNTER — Encounter: Payer: Self-pay | Admitting: *Deleted

## 2023-03-13 ENCOUNTER — Telehealth: Payer: Self-pay | Admitting: *Deleted

## 2023-03-13 LAB — SURGICAL PATHOLOGY

## 2023-03-13 NOTE — Transitions of Care (Post Inpatient/ED Visit) (Signed)
   03/13/2023  Name: [name] MRN: [id number] DOB: 01/14/1951  Today's TOC FU Call Status: Today's TOC FU Call Status:: Unsuccessul Call (1st Attempt) Unsuccessful Call (1st Attempt) Date: 03/13/23  Attempted to reach the patient regarding the most recent Inpatient visit; left HIPAA compliant voice message requesting call back  Follow Up Plan: Additional outreach attempts will be made to reach the patient to complete the Transitions of Care (Post Inpatient visit) call.   Oneta Rack, RN, BSN, CCRN Alumnus RN CM Care Coordination/ Transition of [place] Management [phone]: direct office

## 2023-03-16 ENCOUNTER — Encounter: Payer: Self-pay | Admitting: *Deleted

## 2023-03-16 ENCOUNTER — Telehealth: Payer: Self-pay | Admitting: *Deleted

## 2023-03-16 NOTE — Transitions of Care (Post Inpatient/ED Visit) (Signed)
   03/16/2023  Name: [name] MRN: [id number] DOB: 1951/05/15  Today's TOC FU Call Status: Today's TOC FU Call Status:: Unsuccessful Call (2nd Attempt) Unsuccessful Call (2nd Attempt) Date: 03/16/23  Attempted to reach the patient regarding the most recent Inpatient visit; left HIPAA compliant voice message requesting call back  Follow Up Plan: Additional outreach attempts will be made to reach the patient to complete the Transitions of Care (Post Inpatient visit) call.   Oneta Rack, RN, BSN, CCRN Alumnus RN CM Care Coordination/ Transition of [place] Management [phone]: direct office

## 2023-03-17 ENCOUNTER — Telehealth: Payer: Self-pay | Admitting: *Deleted

## 2023-03-17 ENCOUNTER — Encounter: Payer: Self-pay | Admitting: *Deleted

## 2023-03-17 ENCOUNTER — Ambulatory Visit: Payer: Self-pay | Admitting: *Deleted

## 2023-03-17 NOTE — Transitions of Care (Post Inpatient/ED Visit) (Signed)
   03/17/2023  Name: [name] MRN: [id number] DOB: [birth year]  Today's TOC FU Call Status: Today's TOC FU Call Status:: Unsuccessful Call (3rd Attempt) Unsuccessful Call (3rd Attempt) Date: 03/17/23  Attempted to reach the patient regarding the most recent Inpatient visit; left HIPAA compliant voice message requesting call back  Follow Up Plan: No further outreach attempts will be made at this time. We have been unable to contact the patient.  Oneta Rack, RN, BSN, CCRN Alumnus RN CM Care Coordination/ Transition of [place] Management [phone]: direct office

## 2023-03-17 NOTE — Chronic Care Management (AMB) (Signed)
   03/17/2023  [place] 1951/06/07 [id number]   Enrollment status changed to previously enrolled.  Jacqlyn Larsen [facility] BSN RN Case Manager [phone]

## 2023-03-23 DIAGNOSIS — R338 Other retention of urine: Secondary | ICD-10-CM | POA: Diagnosis not present

## 2023-04-02 DIAGNOSIS — Z96641 Presence of right artificial hip joint: Secondary | ICD-10-CM | POA: Diagnosis not present

## 2023-04-06 ENCOUNTER — Ambulatory Visit (INDEPENDENT_AMBULATORY_CARE_PROVIDER_SITE_OTHER): Payer: Medicare Other | Admitting: Adult Health

## 2023-04-06 ENCOUNTER — Encounter (INDEPENDENT_AMBULATORY_CARE_PROVIDER_SITE_OTHER): Payer: Self-pay | Admitting: Adult Health

## 2023-04-06 DIAGNOSIS — N400 Enlarged prostate without lower urinary tract symptoms: Secondary | ICD-10-CM | POA: Diagnosis not present

## 2023-04-06 DIAGNOSIS — K760 Fatty (change of) liver, not elsewhere classified: Secondary | ICD-10-CM

## 2023-04-06 DIAGNOSIS — Z6834 Body mass index (BMI) 34.0-34.9, adult: Secondary | ICD-10-CM | POA: Diagnosis not present

## 2023-04-06 DIAGNOSIS — I10 Essential (primary) hypertension: Secondary | ICD-10-CM | POA: Diagnosis not present

## 2023-04-06 DIAGNOSIS — E669 Obesity, unspecified: Secondary | ICD-10-CM | POA: Diagnosis not present

## 2023-04-06 DIAGNOSIS — R7303 Prediabetes: Secondary | ICD-10-CM

## 2023-04-06 MED ORDER — METFORMIN HCL 500 MG PO TABS: 1000.0000 mg | ORAL_TABLET | Freq: Every day | ORAL | 1 refills | Status: DC

## 2023-04-06 NOTE — Progress Notes (Signed)
WEIGHT SUMMARY AND BIOMETRICS  Vitals Temp: 98.5 F (36.9 C) BP: 116/77 Pulse Rate: 69 SpO2: 97 %   Anthropometric Measurements Height: 5\' 11"  (1.803 m) Weight: 244 lb (110.7 kg) BMI (Calculated): 34.05 Weight at Last Visit: 249lb Weight Lost Since Last Visit: 5lb Weight Gained Since Last Visit: 0 Starting Weight: 266lb Total Weight Loss (lbs): 22 lb (9.979 kg)   Body Composition  Body Fat %: 35.2 % Fat Mass (lbs): 86 lbs Muscle Mass (lbs): 150.8 lbs Total Body Water (lbs): 110.4 lbs Visceral Fat Rating : 21   Other Clinical Data Fasting: No Labs: No Today's Visit #: 18 Starting Date: 01/01/21    Chief Complaint:   OBESITY [name] [name] here to discuss his progress with his obesity treatment plan. He [name] on the the Category 3 Plan and states he [name] following his eating plan approximately 60 % of the time.  He states he [name] not currently exercising due to recent surgery- 03/11/2023 prostatectomy with hernia repair.   Interim History:  03/11/2023- XI ROBOTIC ASSISTED SIMPLE PROSTATECTOMY HERNIA REPAIR UMBILICAL ADULT XI ROBOTIC ASSISTED BILATERAL INGUINAL HERNIA   He was hospitalized for one night, d/c'd stable home. He reports low appetite for the first week or so post op, slowly been increasing intake. He has not been swimming, however remained active with house work.  He reports all surgical sites are well healed and denies any systemic s/s of infection.  Subjective:   1. NAFLD (nonalcoholic fatty liver disease) Discussed Labs  Latest Reference Range & Units 02/24/23 08:41  Alkaline Phosphatase 44 - 121 IU/L 96  Albumin 3.9 - 4.9 g/dL 4.4  Albumin/Globulin Ratio 1.2 - 2.2  1.5  AST 0 - 40 IU/L 42 (H)  ALT 0 - 44 IU/L 47 (H)  (H): Data [name] abnormally high He denies RUQ pain  2. Pre-diabetes Discussed Labs- worsening Lab Results  Component Value Date   HGBA1C 5.9 (H) 02/24/2023   HGBA1C 5.7 (H) 10/23/2022   HGBA1C 5.8 (H) 06/09/2022   His  father was dx'd with T2D in his late 22s. He [name] currently on Metformin 500mg - 2 tabs QAM- denies GI upset  3. Essential hypertension Discussed Labs BP excellent at OV. 02/24/23- CMP- Electrolytes and kidney fx both stable at OV.  4. Enlarged prostate 03/11/2023- XI ROBOTIC ASSISTED SIMPLE PROSTATECTOMY HERNIA REPAIR UMBILICAL ADULT XI ROBOTIC ASSISTED BILATERAL INGUINAL HERNIA   He reports nocturia prior to procedure 4 times per night, now reduced to 2 times with less time for initiating stream.  Assessment/Plan:   1. NAFLD (nonalcoholic fatty liver disease) Continue with weight loss efforts. Continue to avoid Hepatotoxic substances  2. Pre-diabetes Refill - metFORMIN (GLUCOPHAGE) 500 MG tablet; Take 2 tablets (1,000 mg total) by mouth daily with breakfast.  Dispense: 60 tablet; Refill: 1  3. Essential hypertension Continue daily Micradis 80mg  and Toprol XL 25mg    4. Enlarged prostate F/u with surgical team as directed.  5. Obesity, Current BMI 34.0.5  [name] currently in the action stage of change. As such, his goal [name] to continue with weight loss efforts. He has agreed to the Category 3 Plan.   Exercise goals: Older adults with chronic conditions should understand whether and how their conditions affect their ability to do regular physical activity safely.  Behavioral modification strategies: increasing lean protein intake, decreasing simple carbohydrates, increasing vegetables, increasing water intake, no skipping meals, meal planning and cooking strategies, and planning for success.  [name] has agreed to follow-up with  our clinic in 4 weeks. He was informed of the importance of frequent follow-up visits to maximize his success with intensive lifestyle modifications for his multiple health conditions.   Objective:   Blood pressure 116/77, pulse 69, temperature 98.5 F (36.9 C), height 5\' 11"  (1.803 m), weight 244 lb (110.7 kg), SpO2 97 %. Body mass index [name] 34.03  kg/m.  General: Cooperative, alert, well developed, in no acute distress. HEENT: Conjunctivae and lids unremarkable. Cardiovascular: Regular rhythm.  Lungs: Normal work of breathing. Neurologic: No focal deficits.   Lab Results  Component Value Date   CREATININE 1.06 03/12/2023   BUN 14 03/12/2023   NA 137 03/12/2023   K 4.8 03/12/2023   CL 103 03/12/2023   CO2 26 03/12/2023   Lab Results  Component Value Date   ALT 47 (H) 02/24/2023   AST 42 (H) 02/24/2023   ALKPHOS 96 02/24/2023   BILITOT 0.7 02/24/2023   Lab Results  Component Value Date   HGBA1C 5.9 (H) 02/24/2023   HGBA1C 5.7 (H) 10/23/2022   HGBA1C 5.8 (H) 06/09/2022   HGBA1C 5.5 01/09/2022   HGBA1C 5.8 (H) 08/19/2021   Lab Results  Component Value Date   INSULIN 15.6 02/24/2023   INSULIN 19.4 10/23/2022   INSULIN 15.2 06/09/2022   INSULIN 8.4 01/09/2022   INSULIN 22.7 08/19/2021   Lab Results  Component Value Date   TSH 0.851 10/23/2022   Lab Results  Component Value Date   CHOL 117 05/27/2022   HDL 42 05/27/2022   LDLCALC 62 05/27/2022   LDLDIRECT 153.1 06/09/2011   TRIG 59 05/27/2022   CHOLHDL 2.8 05/27/2022   Lab Results  Component Value Date   VD25OH 46.8 02/24/2023   VD25OH 60.2 10/23/2022   VD25OH 80.1 06/09/2022   Lab Results  Component Value Date   WBC 5.7 03/05/2023   HGB 12.2 (L) 03/12/2023   HCT 36.8 (L) 03/12/2023   MCV 90.7 03/05/2023   PLT 165 03/05/2023   No results found for: "IRON", "TIBC", "FERRITIN"  Attestation Statements:   Reviewed by clinician on day of visit: allergies, medications, problem list, medical history, surgical history, family history, social history, and previous encounter notes.  I have reviewed the above documentation for accuracy and completeness, and I agree with the above. -  Brently Voorhis d. Karlon Schlafer, NP-C

## 2023-04-23 ENCOUNTER — Other Ambulatory Visit: Payer: Self-pay | Admitting: Cardiology

## 2023-04-28 DIAGNOSIS — M25551 Pain in right hip: Secondary | ICD-10-CM | POA: Diagnosis not present

## 2023-05-04 ENCOUNTER — Ambulatory Visit (INDEPENDENT_AMBULATORY_CARE_PROVIDER_SITE_OTHER): Payer: Medicare Other | Admitting: Adult Health

## 2023-05-04 ENCOUNTER — Encounter (INDEPENDENT_AMBULATORY_CARE_PROVIDER_SITE_OTHER): Payer: Self-pay | Admitting: Adult Health

## 2023-05-04 DIAGNOSIS — I10 Essential (primary) hypertension: Secondary | ICD-10-CM

## 2023-05-04 DIAGNOSIS — E669 Obesity, unspecified: Secondary | ICD-10-CM | POA: Diagnosis not present

## 2023-05-04 DIAGNOSIS — Z6834 Body mass index (BMI) 34.0-34.9, adult: Secondary | ICD-10-CM | POA: Diagnosis not present

## 2023-05-04 DIAGNOSIS — R7303 Prediabetes: Secondary | ICD-10-CM

## 2023-05-04 MED ORDER — METFORMIN HCL 500 MG PO TABS: 1000.0000 mg | ORAL_TABLET | Freq: Every day | ORAL | 1 refills | Status: DC

## 2023-05-04 NOTE — Progress Notes (Signed)
WEIGHT SUMMARY AND BIOMETRICS  Vitals Temp: 97.7 F (36.5 C) BP: 108/70 Pulse Rate: 61 SpO2: 98 %   Anthropometric Measurements Height: 5\' 11"  (1.803 m) Weight: 247 lb (112 kg) BMI (Calculated): 34.46 Weight at Last Visit: 244lb Weight Lost Since Last Visit: 0 Weight Gained Since Last Visit: 3lb Starting Weight: 266lb Total Weight Loss (lbs): 19 lb (8.618 kg)   Body Composition  Body Fat %: 36 % Fat Mass (lbs): 89.2 lbs Muscle Mass (lbs): 150.6 lbs Total Body Water (lbs): 112 lbs Visceral Fat Rating : 22   Other Clinical Data Fasting: no Labs: no Today's Visit #: 19 Starting Date: 01/01/21    Chief Complaint:   OBESITY [name] is here to discuss his progress with his obesity treatment plan. He is on the the Category 3 Plan and states he is following his eating plan approximately 70 % of the time. He states he is exercising Swimming 60 minutes 5 times per week.   Interim History:  [name] has resumed Swimming Regime: He will walk a lap, then swim a lap- alternating for 12 laps He will then perform leg and hop exercises on side of pool. He will exercise in this manner for 60 minutes. He denies CP with exertion.  Of Note- He is followed annually by Cards/Dr. Mayford Knife- May- unable to be seen until Sept 2024. He denies any acute CP complaints at present  Subjective:   1. Essential hypertension BP/HR excellent at OV. He is currently on: metoprolol XL 25 mg daily, telmisartan 80 mg daily  Dr. Royston Cowper evaluates him annually- typically May. Unable to be seen  until Sept 2024 He denies any acute CP complaints at present  2. Pre-diabetes Lab Results  Component Value Date   HGBA1C 5.9 (H) 02/24/2023   HGBA1C 5.7 (H) 10/23/2022   HGBA1C 5.8 (H) 06/09/2022   He is on Metformin 500mg  - 2 tabs at breakfast. His insurance will not cover any GLP-1 therapy.  Assessment/Plan:   1. Essential hypertension Continue regular exercise and metoprolol XL 25  mg daily, telmisartan 80 mg daily   2. Pre-diabetes Refill - metFORMIN (GLUCOPHAGE) 500 MG tablet; Take 2 tablets (1,000 mg total) by mouth daily with breakfast.  Dispense: 60 tablet; Refill: 1  3. Obesity, Current BMI 34.46  [name] is currently in the action stage of change. As such, his goal is to continue with weight loss efforts. He has agreed to the Category 3 Plan.   Exercise goals: Older adults should follow the adult guidelines. When older adults cannot meet the adult guidelines, they should be as physically active as their abilities and conditions will allow.  Older adults should do exercises that maintain or improve balance if they are at risk of falling.  Older adults with chronic conditions should understand whether and how their conditions affect their ability to do regular physical activity safely.  Behavioral modification strategies: increasing lean protein intake, decreasing simple carbohydrates, increasing vegetables, increasing water intake, no skipping meals, meal planning and cooking strategies, and planning for success.  [name] has agreed to follow-up with our clinic in 4 weeks. He was informed of the importance of frequent follow-up visits to maximize his success with intensive lifestyle modifications for his multiple health conditions.   Objective:   Blood pressure 108/70, pulse 61, temperature 97.7 F (36.5 C), height 5\' 11"  (1.803 m), weight 247 lb (112 kg), SpO2 98 %. Body mass index is 34.45 kg/m.  General: Cooperative, alert, well developed, in no  acute distress. HEENT: Conjunctivae and lids unremarkable. Cardiovascular: Regular rhythm.  Lungs: Normal work of breathing. Neurologic: No focal deficits.   Lab Results  Component Value Date   CREATININE 1.06 03/12/2023   BUN 14 03/12/2023   NA 137 03/12/2023   K 4.8 03/12/2023   CL 103 03/12/2023   CO2 26 03/12/2023   Lab Results  Component Value Date   ALT 47 (H) 02/24/2023   AST 42 (H) 02/24/2023    ALKPHOS 96 02/24/2023   BILITOT 0.7 02/24/2023   Lab Results  Component Value Date   HGBA1C 5.9 (H) 02/24/2023   HGBA1C 5.7 (H) 10/23/2022   HGBA1C 5.8 (H) 06/09/2022   HGBA1C 5.5 01/09/2022   HGBA1C 5.8 (H) 08/19/2021   Lab Results  Component Value Date   INSULIN 15.6 02/24/2023   INSULIN 19.4 10/23/2022   INSULIN 15.2 06/09/2022   INSULIN 8.4 01/09/2022   INSULIN 22.7 08/19/2021   Lab Results  Component Value Date   TSH 0.851 10/23/2022   Lab Results  Component Value Date   CHOL 117 05/27/2022   HDL 42 05/27/2022   LDLCALC 62 05/27/2022   LDLDIRECT 153.1 06/09/2011   TRIG 59 05/27/2022   CHOLHDL 2.8 05/27/2022   Lab Results  Component Value Date   VD25OH 46.8 02/24/2023   VD25OH 60.2 10/23/2022   VD25OH 80.1 06/09/2022   Lab Results  Component Value Date   WBC 5.7 03/05/2023   HGB 12.2 (L) 03/12/2023   HCT 36.8 (L) 03/12/2023   MCV 90.7 03/05/2023   PLT 165 03/05/2023   No results found for: "IRON", "TIBC", "FERRITIN"   Attestation Statements:   Reviewed by clinician on day of visit: allergies, medications, problem list, medical history, surgical history, family history, social history, and previous encounter notes.  I have reviewed the above documentation for accuracy and completeness, and I agree with the above. -  Ilhan Debenedetto d. Dietrich Ke, NP-C

## 2023-05-27 ENCOUNTER — Other Ambulatory Visit: Payer: Self-pay | Admitting: Cardiology

## 2023-06-16 ENCOUNTER — Encounter: Payer: Self-pay | Admitting: Internal Medicine

## 2023-06-16 ENCOUNTER — Ambulatory Visit (INDEPENDENT_AMBULATORY_CARE_PROVIDER_SITE_OTHER): Payer: Medicare Other | Admitting: Adult Health

## 2023-06-16 NOTE — Patient Instructions (Addendum)
      Blood work was ordered.   The lab is on the first floor.    Medications changes include :   none      Return in about 1 year (around 06/16/2024) for follow up.  

## 2023-06-16 NOTE — Progress Notes (Unsigned)
Subjective:    Patient ID: [name], male    DOB: 1951-06-03, 72 y.o.   MRN: [id number]     HPI [name] is here for follow up of his chronic medical problems.   Hernia surgery - mid march - still a little sensitive. He does not have aspirin and Plavix on his medication list but states he is taking them-restart them after surgery.  Swimming now for exercise.  Working on weight loss.  Going to the [facility] wellness [facility]  Medications and allergies reviewed with patient and updated if appropriate.  Current Outpatient Medications on File Prior to Visit  Medication Sig Dispense Refill   atorvastatin (LIPITOR) 80 MG tablet TAKE 1 TABLET BY MOUTH DAILY AT 6 PM. 90 tablet 1   finasteride (PROSCAR) 5 MG tablet Take 5 mg by mouth daily.     levothyroxine (SYNTHROID) 137 MCG tablet Take 1 tablet (137 mcg total) by mouth daily before breakfast. 90 tablet 3   metFORMIN (GLUCOPHAGE) 500 MG tablet Take 2 tablets (1,000 mg total) by mouth daily with breakfast. 60 tablet 1   metoprolol succinate (TOPROL-XL) 25 MG 24 hr tablet TAKE 1 TABLET (25 MG TOTAL) BY MOUTH DAILY. 90 tablet 3   nitroGLYCERIN (NITROSTAT) 0.4 MG SL tablet PLACE 1 TABLET (0.4 MG TOTAL) UNDER THE TONGUE EVERY 5 (FIVE) MINUTES AS NEEDED FOR CHEST PAIN. 25 tablet 3   telmisartan (MICARDIS) 80 MG tablet TAKE 1 TABLET BY MOUTH EVERY DAY 90 tablet 3   No current facility-administered medications on file prior to visit.     Review of Systems  Constitutional:  Negative for fever.  Eyes:  Negative for visual disturbance.  Respiratory:  Negative for cough, shortness of breath and wheezing.   Cardiovascular:  Negative for chest pain, palpitations and leg swelling.  Gastrointestinal:  Negative for abdominal pain, blood in stool, constipation and diarrhea.       No gerd  Neurological:  Negative for light-headedness and headaches.       Objective:   Vitals:   06/17/23 1031  BP: 124/80  Pulse: (!) 59   Temp: 97.6 F (36.4 C)  SpO2: 96%   BP Readings from Last 3 Encounters:  06/17/23 124/80  05/04/23 108/70  04/06/23 116/77   Wt Readings from Last 3 Encounters:  06/17/23 253 lb 8 oz (115 kg)  05/04/23 247 lb (112 kg)  04/06/23 244 lb (110.7 kg)   Body mass index is 35.36 kg/m.    Physical Exam Constitutional:      General: He is not in acute distress.    Appearance: Normal appearance. He is not ill-appearing.  HENT:     Head: Normocephalic and atraumatic.  Eyes:     Conjunctiva/sclera: Conjunctivae normal.  Cardiovascular:     Rate and Rhythm: Normal rate and regular rhythm.     Heart sounds: Normal heart sounds.  Pulmonary:     Effort: Pulmonary effort is normal. No respiratory distress.     Breath sounds: Normal breath sounds. No wheezing or rales.  Musculoskeletal:     Right lower leg: Edema (trace) present.     Left lower leg: Edema (trace) present.  Skin:    General: Skin is warm and dry.     Findings: No rash.  Neurological:     Mental Status: He is alert. Mental status is at baseline.  Psychiatric:        Mood and Affect: Mood normal.  Lab Results  Component Value Date   WBC 5.7 03/05/2023   HGB 12.2 (L) 03/12/2023   HCT 36.8 (L) 03/12/2023   PLT 165 03/05/2023   GLUCOSE 141 (H) 03/12/2023   CHOL 117 05/27/2022   TRIG 59 05/27/2022   HDL 42 05/27/2022   LDLDIRECT 153.1 06/09/2011   LDLCALC 62 05/27/2022   ALT 47 (H) 02/24/2023   AST 42 (H) 02/24/2023   NA 137 03/12/2023   K 4.8 03/12/2023   CL 103 03/12/2023   CREATININE 1.06 03/12/2023   BUN 14 03/12/2023   CO2 26 03/12/2023   TSH 0.851 10/23/2022   PSA 10 03/13/2022   INR 1.1 04/05/2021   HGBA1C 5.9 (H) 02/24/2023     Assessment & Plan:    See Problem List for Assessment and Plan of chronic medical problems.

## 2023-06-17 ENCOUNTER — Ambulatory Visit: Payer: Medicare Other | Admitting: Internal Medicine

## 2023-06-17 ENCOUNTER — Ambulatory Visit (INDEPENDENT_AMBULATORY_CARE_PROVIDER_SITE_OTHER): Payer: Medicare Other | Admitting: Internal Medicine

## 2023-06-17 VITALS — BP 124/80 | HR 59 | Temp 97.6°F | Ht 71.0 in | Wt 253.5 lb

## 2023-06-17 DIAGNOSIS — E034 Atrophy of thyroid (acquired): Secondary | ICD-10-CM

## 2023-06-17 DIAGNOSIS — R7989 Other specified abnormal findings of blood chemistry: Secondary | ICD-10-CM

## 2023-06-17 DIAGNOSIS — E785 Hyperlipidemia, unspecified: Secondary | ICD-10-CM | POA: Diagnosis not present

## 2023-06-17 DIAGNOSIS — I10 Essential (primary) hypertension: Secondary | ICD-10-CM | POA: Diagnosis not present

## 2023-06-17 DIAGNOSIS — I2510 Atherosclerotic heart disease of native coronary artery without angina pectoris: Secondary | ICD-10-CM

## 2023-06-17 DIAGNOSIS — R7303 Prediabetes: Secondary | ICD-10-CM | POA: Diagnosis not present

## 2023-06-17 DIAGNOSIS — Z9861 Coronary angioplasty status: Secondary | ICD-10-CM | POA: Diagnosis not present

## 2023-06-17 DIAGNOSIS — Z6835 Body mass index (BMI) 35.0-35.9, adult: Secondary | ICD-10-CM

## 2023-06-17 LAB — LIPID PANEL
Cholesterol: 123 mg/dL (ref 0–200)
HDL: 42.9 mg/dL (ref >39.00)
LDL Cholesterol: 65 mg/dL (ref 0–99)
NonHDL: 80.45
Total CHOL/HDL Ratio: 3
Triglycerides: 75 mg/dL (ref 0.0–149.0)
VLDL: 15 mg/dL (ref 0.0–40.0)

## 2023-06-17 LAB — CBC WITH DIFFERENTIAL/PLATELET
Basophils Absolute: 0 10*3/uL (ref 0.0–0.1)
Basophils Relative: 0.8 % (ref 0.0–3.0)
Eosinophils Absolute: 0.1 10*3/uL (ref 0.0–0.7)
Eosinophils Relative: 3 % (ref 0.0–5.0)
HCT: 41.6 % (ref 39.0–52.0)
Hemoglobin: 13.5 g/dL (ref 13.0–17.0)
Lymphocytes Relative: 23.9 % (ref 12.0–46.0)
Lymphs Abs: 1 10*3/uL (ref 0.7–4.0)
MCHC: 32.3 g/dL (ref 30.0–36.0)
MCV: 82.8 fl (ref 78.0–100.0)
Monocytes Absolute: 0.7 10*3/uL (ref 0.1–1.0)
Monocytes Relative: 15 % — ABNORMAL HIGH (ref 3.0–12.0)
Neutro Abs: 2.5 10*3/uL (ref 1.4–7.7)
Neutrophils Relative %: 57.3 % (ref 43.0–77.0)
Platelets: 183 10*3/uL (ref 150.0–400.0)
RBC: 5.03 Mil/uL (ref 4.22–5.81)
RDW: 15.3 % (ref 11.5–15.5)
WBC: 4.3 10*3/uL (ref 4.0–10.5)

## 2023-06-17 LAB — TSH: TSH: 0.65 u[IU]/mL (ref 0.35–5.50)

## 2023-06-17 LAB — HEMOGLOBIN A1C: Hgb A1c MFr Bld: 6.2 % (ref 4.6–6.5)

## 2023-06-17 LAB — COMPREHENSIVE METABOLIC PANEL
ALT: 155 U/L — ABNORMAL HIGH (ref 0–53)
AST: 117 U/L — ABNORMAL HIGH (ref 0–37)
Albumin: 4.4 g/dL (ref 3.5–5.2)
Alkaline Phosphatase: 74 U/L (ref 39–117)
BUN: 17 mg/dL (ref 6–23)
CO2: 26 mEq/L (ref 19–32)
Calcium: 9.8 mg/dL (ref 8.4–10.5)
Chloride: 103 mEq/L (ref 96–112)
Creatinine, Ser: 0.9 mg/dL (ref 0.40–1.50)
GFR: 86.86 mL/min (ref >60.00)
Glucose, Bld: 97 mg/dL (ref 70–99)
Potassium: 4.6 mEq/L (ref 3.5–5.1)
Sodium: 137 mEq/L (ref 135–145)
Total Bilirubin: 0.5 mg/dL (ref 0.2–1.2)
Total Protein: 8 g/dL (ref 6.0–8.3)

## 2023-06-17 MED ORDER — CLOPIDOGREL BISULFATE 75 MG PO TABS: 75.0000 mg | ORAL_TABLET | Freq: Every day | ORAL | 3 refills | Status: DC

## 2023-06-17 MED ORDER — ASPIRIN 81 MG PO CHEW: 81.0000 mg | CHEWABLE_TABLET | Freq: Every day | ORAL | Status: AC

## 2023-06-17 NOTE — Assessment & Plan Note (Addendum)
Chronic Blood pressure well controlled CMP Continue continue metoprolol XL 25 mg daily, telmisartan 80 mg daily

## 2023-06-17 NOTE — Assessment & Plan Note (Signed)
Chronic Follow with cardiology Check lipid panel, CMP Continue atorvastatin 80 mg daily

## 2023-06-17 NOTE — Assessment & Plan Note (Addendum)
Chronic Lab Results  Component Value Date   HGBA1C 5.9 (H) 02/24/2023   On metformin 1000 mg with breakfast Check A1c

## 2023-06-17 NOTE — Assessment & Plan Note (Signed)
Chronic Going to the healthy weight and wellness clinic Currently on metformin for his prediabetes He is exercising regularly-swimming

## 2023-06-17 NOTE — Assessment & Plan Note (Signed)
Chronic  Clinically euthyroid Currently taking levothyroxine 137 mcg daily Check tsh  Titrate med dose if needed  

## 2023-06-17 NOTE — Assessment & Plan Note (Signed)
Chronic S/p STEMI Following with cardiology Continue atorvastatin 80 mg daily Stopped temporarily aspirin 81 mg daily and Plavix 75 mg daily for surgery in the fall- Blood pressure well controlled-continue good BP medications

## 2023-06-18 ENCOUNTER — Telehealth: Payer: Self-pay | Admitting: Internal Medicine

## 2023-06-18 NOTE — Telephone Encounter (Signed)
Please call patient and make sure he does review his most recent blood work/has read my MyChart message regarding the blood work.  He needs to return to the lab for repeat blood work.

## 2023-06-18 NOTE — Addendum Note (Signed)
Addended by: Pincus Sanes on: 06/18/2023 07:00 PM   Modules accepted: Orders

## 2023-06-19 NOTE — Telephone Encounter (Signed)
Spoke with pt and he did see lab results/ Dr. Lawerance Bach message. He had no further questions and was made aware about getting labs done again on the first floor.

## 2023-06-23 ENCOUNTER — Ambulatory Visit (INDEPENDENT_AMBULATORY_CARE_PROVIDER_SITE_OTHER): Payer: Medicare Other | Admitting: Family Medicine

## 2023-06-23 ENCOUNTER — Encounter (INDEPENDENT_AMBULATORY_CARE_PROVIDER_SITE_OTHER): Payer: Self-pay | Admitting: Family Medicine

## 2023-06-23 VITALS — BP 129/78 | HR 58 | Temp 97.9°F | Ht 71.0 in | Wt 249.0 lb

## 2023-06-23 DIAGNOSIS — Z6834 Body mass index (BMI) 34.0-34.9, adult: Secondary | ICD-10-CM

## 2023-06-23 DIAGNOSIS — I213 ST elevation (STEMI) myocardial infarction of unspecified site: Secondary | ICD-10-CM | POA: Diagnosis not present

## 2023-06-23 DIAGNOSIS — K760 Fatty (change of) liver, not elsewhere classified: Secondary | ICD-10-CM | POA: Diagnosis not present

## 2023-06-23 DIAGNOSIS — R7303 Prediabetes: Secondary | ICD-10-CM

## 2023-06-23 DIAGNOSIS — E669 Obesity, unspecified: Secondary | ICD-10-CM | POA: Diagnosis not present

## 2023-06-23 MED ORDER — METFORMIN HCL 500 MG PO TABS: 1000.0000 mg | ORAL_TABLET | Freq: Every day | ORAL | 1 refills | Status: DC

## 2023-06-23 NOTE — Progress Notes (Signed)
Office: [phone]  /  Fax: [phone]  WEIGHT SUMMARY AND BIOMETRICS  Starting Date: 01/01/21  Starting Weight: 166lb   Weight Lost Since Last Visit: 0lb   Vitals Temp: 97.9 F (36.6 C) BP: 129/78 Pulse Rate: (!) 58 SpO2: 98 %   Body Composition  Body Fat %: 35.1 % Fat Mass (lbs): 87.6 lbs Muscle Mass (lbs): 154 lbs Total Body Water (lbs): 111.2 lbs Visceral Fat Rating : 22    HPI  Chief Complaint: OBESITY  [name] is here to discuss his progress with his obesity treatment plan. He is on the the Category 3 Plan and states he is following his eating plan approximately 50 % of the time. He states he is swimming 60 minutes 5 times per week.   Interval History:  Since last office visit he is up 2 lb He gained 3.4 lb of muscle mass and lost 1.8 lb body  He has a net weight loss of 17 lb in the past 2+ years This is a 6.3% total body weight loss in 2 years of medically supervised weight management without use of antiobesity medication He had labs updated with PCP 6/19 LFTS were up with sweets, beer,  A1c rose to 6.2 from 5.9 He had prostate surgery in March, able to void well He has been swimming but has held off on weight training as he is recovering from the hernia repair  Pharmacotherapy: metformin 1000 mg once daily for PDM  PHYSICAL EXAM:  Blood pressure 129/78, pulse (!) 58, temperature 97.9 F (36.6 C), height 5\' 11"  (1.803 m), weight 249 lb (112.9 kg), SpO2 98 %. Body mass index is 34.73 kg/m.  General: He is overweight, cooperative, alert, well developed, and in no acute distress. PSYCH: Has normal mood, affect and thought process.   Lungs: Normal breathing effort, no conversational dyspnea.   ASSESSMENT AND PLAN  TREATMENT PLAN FOR OBESITY:  Recommended Dietary Goals  Tabb is currently in the action stage of change. As such, his goal is to continue weight management plan. He has agreed to the Category 3 Plan. -Mediterranean diet handout  provided  Behavioral Intervention  We discussed the following Behavioral Modification Strategies today: increasing lean protein intake, decreasing simple carbohydrates , increasing vegetables, increasing lower glycemic fruits, increasing fiber rich foods, increasing water intake, avoiding temptations and identifying enticing environmental cues, continue to work on implementation of reduced calorie nutritional plan, continue to practice mindfulness when eating, and planning for success.  Additional resources provided today: NA  Recommended Physical Activity Goals  Aveion has been advised to work up to 150 minutes of moderate intensity aerobic activity a week and strengthening exercises 2-3 times per week for cardiovascular health, weight loss maintenance and preservation of muscle mass.   He has agreed to Continue current level of physical activity   Pharmacotherapy changes for the treatment of obesity: No changes  ASSOCIATED CONDITIONS ADDRESSED TODAY  Pre-diabetes Assessment & Plan: Lab Results  Component Value Date   HGBA1C 6.2 06/17/2023   Worsening.  Reviewed his labs drawn by his PCP dated 6/19.  His A1c did increase.  He is currently on metformin 1000 mg once daily with food.  He denies GI upset.  His renal function is stable.  He has been swimming 1 hour 5 days a week.  He has lost 6.3% total body weight loss in the past 2+ years.  Continue metformin 1000 mg once daily with food.  Consider use of Ozempic/Wegovy given cardiac improvements and rise in  A1c.  Orders: -     metFORMIN HCl; Take 2 tablets (1,000 mg total) by mouth daily with breakfast.  Dispense: 60 tablet; Refill: 1  Obesity, Starting BMI 37.10  BMI 34.0-34.9,adult  NAFLD (nonalcoholic fatty liver disease) Assessment & Plan: Patient has had a confirmed diagnosis of hepatic steatosis on ultrasound January 2021.  His most recent CMP dated 06/17/2023 shows an AST of 117 and ALT of 155.  He is scheduled for repeat  hepatic function panel tomorrow with his PCP.  He denies issues with right upper quadrant pain, change in bowel movements or feeling ill.  He had just returned from a beach vacation where he was indulging more on beer and sweets.  Continue to work on weight reduction for the treatment of fatty liver disease.  Avoid consumption of alcohol.  Keep upcoming visit for repeat hepatic function panel.   ST elevation myocardial infarction (STEMI), unspecified artery [facility]) Assessment & Plan: Patient reports good compliance taking telmisartan 80 mg once daily, metoprolol XL 25 mg once daily, atorvastatin 80 mg once daily and aspirin 81 mg daily.  He denies recent issues with chest pain, dyspnea on exertion.  He is working on medically supervised weight loss.  Plan to transition him long-term to a reduced calorie Mediterranean style diet which is more heart healthy. Consider use of Ozempic given his history of MI according to select trial details.       He was informed of the importance of frequent follow up visits to maximize his success with intensive lifestyle modifications for his multiple health conditions.   ATTESTASTION STATEMENTS:  Reviewed by clinician on day of visit: allergies, medications, problem list, medical history, surgical history, family history, social history, and previous encounter notes pertinent to obesity diagnosis.   I have personally spent 30 minutes total time today in preparation, patient care, nutritional counseling and documentation for this visit, including the following: review of clinical lab tests; review of medical tests/procedures/services.      Glennis Brink, DO DABFM, DABOM [facility] [address] [place], [place] [address] [phone]

## 2023-06-23 NOTE — Assessment & Plan Note (Addendum)
Lab Results  Component Value Date   HGBA1C 6.2 06/17/2023   Worsening.  Reviewed his labs drawn by his PCP dated 6/19.  His A1c did increase.  He is currently on metformin 1000 mg once daily with food.  He denies GI upset.  His renal function is stable.  He has been swimming 1 hour 5 days a week.  He has lost 6.3% total body weight loss in the past 2+ years.  Continue metformin 1000 mg once daily with food.  Consider use of Ozempic/Wegovy given cardiac improvements and rise in A1c.

## 2023-06-23 NOTE — Assessment & Plan Note (Signed)
Patient has had a confirmed diagnosis of hepatic steatosis on ultrasound January 2021.  His most recent CMP dated 06/17/2023 shows an AST of 117 and ALT of 155.  He is scheduled for repeat hepatic function panel tomorrow with his PCP.  He denies issues with right upper quadrant pain, change in bowel movements or feeling ill.  He had just returned from a beach vacation where he was indulging more on beer and sweets.  Continue to work on weight reduction for the treatment of fatty liver disease.  Avoid consumption of alcohol.  Keep upcoming visit for repeat hepatic function panel.

## 2023-06-23 NOTE — Assessment & Plan Note (Signed)
Patient reports good compliance taking telmisartan 80 mg once daily, metoprolol XL 25 mg once daily, atorvastatin 80 mg once daily and aspirin 81 mg daily.  He denies recent issues with chest pain, dyspnea on exertion.  He is working on medically supervised weight loss.  Plan to transition him long-term to a reduced calorie Mediterranean style diet which is more heart healthy. Consider use of Ozempic given his history of MI according to select trial details.

## 2023-06-25 ENCOUNTER — Other Ambulatory Visit (INDEPENDENT_AMBULATORY_CARE_PROVIDER_SITE_OTHER): Payer: Medicare Other

## 2023-06-25 DIAGNOSIS — R7989 Other specified abnormal findings of blood chemistry: Secondary | ICD-10-CM | POA: Diagnosis not present

## 2023-06-25 LAB — HEPATIC FUNCTION PANEL
ALT: 181 U/L — ABNORMAL HIGH (ref 0–53)
AST: 118 U/L — ABNORMAL HIGH (ref 0–37)
Albumin: 4.2 g/dL (ref 3.5–5.2)
Alkaline Phosphatase: 71 U/L (ref 39–117)
Bilirubin, Direct: 0.1 mg/dL (ref 0.0–0.3)
Total Bilirubin: 0.6 mg/dL (ref 0.2–1.2)
Total Protein: 7.5 g/dL (ref 6.0–8.3)

## 2023-06-29 DIAGNOSIS — R972 Elevated prostate specific antigen [PSA]: Secondary | ICD-10-CM | POA: Diagnosis not present

## 2023-06-29 DIAGNOSIS — R338 Other retention of urine: Secondary | ICD-10-CM | POA: Diagnosis not present

## 2023-06-29 DIAGNOSIS — R8271 Bacteriuria: Secondary | ICD-10-CM | POA: Diagnosis not present

## 2023-06-29 DIAGNOSIS — K429 Umbilical hernia without obstruction or gangrene: Secondary | ICD-10-CM | POA: Diagnosis not present

## 2023-07-03 ENCOUNTER — Encounter: Payer: Self-pay | Admitting: Internal Medicine

## 2023-07-03 ENCOUNTER — Ambulatory Visit (INDEPENDENT_AMBULATORY_CARE_PROVIDER_SITE_OTHER): Payer: Medicare Other | Admitting: Internal Medicine

## 2023-07-03 ENCOUNTER — Telehealth: Payer: Self-pay | Admitting: Internal Medicine

## 2023-07-03 VITALS — BP 112/80 | HR 70 | Ht 71.0 in | Wt 246.0 lb

## 2023-07-03 DIAGNOSIS — R7989 Other specified abnormal findings of blood chemistry: Secondary | ICD-10-CM | POA: Diagnosis not present

## 2023-07-03 DIAGNOSIS — L519 Erythema multiforme, unspecified: Secondary | ICD-10-CM | POA: Insufficient documentation

## 2023-07-03 LAB — HEPATIC FUNCTION PANEL
ALT: 104 U/L — ABNORMAL HIGH (ref 0–53)
AST: 63 U/L — ABNORMAL HIGH (ref 0–37)
Albumin: 4.5 g/dL (ref 3.5–5.2)
Alkaline Phosphatase: 81 U/L (ref 39–117)
Bilirubin, Direct: 0.2 mg/dL (ref 0.0–0.3)
Total Bilirubin: 0.8 mg/dL (ref 0.2–1.2)
Total Protein: 8.3 g/dL (ref 6.0–8.3)

## 2023-07-03 MED ORDER — NYSTATIN 100000 UNIT/ML MT SUSP: 5.0000 mL | Freq: Four times a day (QID) | OROMUCOSAL | 1 refills | Status: DC | PRN

## 2023-07-03 MED ORDER — PREDNISONE 50 MG PO TABS
50.0000 mg | ORAL_TABLET | Freq: Every day | ORAL | 0 refills | Status: DC
Start: 2023-07-03 — End: 2023-07-21

## 2023-07-03 MED ORDER — TRIAMCINOLONE ACETONIDE 0.1 % MT PSTE: 1.0000 | PASTE | Freq: Two times a day (BID) | OROMUCOSAL | 1 refills | Status: DC

## 2023-07-03 NOTE — Telephone Encounter (Signed)
Should ideally be evaluated.  I can send in some mouthwash that will help numb it, but will not heal it.

## 2023-07-03 NOTE — Telephone Encounter (Signed)
Appointment made for this afternoon

## 2023-07-03 NOTE — Telephone Encounter (Signed)
Patient's wife [name] called and said patient has canker sores in his mouth that won't go away. She would like to know if Dr. Lawerance Bach could recommend anything for him. [name] would like a call back at [phone].

## 2023-07-03 NOTE — Assessment & Plan Note (Signed)
Acute Took Bactrim earlier this week for UTI and developed sore throat and ulcers in his mouth-has several ulcerations on tongue, cheeks, inside of mouth Tongue feels swollen Denies lip swelling, throat swelling, no SOB Has lost weight No other mucosal involvement, no other symptoms Likely related to Vern Claude has finished this and advised to never take it again Start prednisone 50 mg daily x 5 days Magic mouthwash Triamcinolone paste Advised that if he gets any throat swelling or difficulty breathing he needs to go to the emergency room

## 2023-07-03 NOTE — Progress Notes (Signed)
Subjective:    Patient ID: [name], male    DOB: 1951/01/01, 72 y.o.   MRN: [id number]      HPI [name] is here for  Chief Complaint  Patient presents with   Mouth Lesions    Saw urology earlier this week and had a urinary tract infection.  He was prescribed Bactrim and the day (three days ago) after he started the medication he had a little bit of a sore throat and a couple of days later developed ulcers throughout his tongue and mouth.   He is rinsing with salt water.  It is difficulty and he has lost some weight.     Medications and allergies reviewed with patient and updated if appropriate.  Current Outpatient Medications on File Prior to Visit  Medication Sig Dispense Refill   aspirin 81 MG chewable tablet Chew 1 tablet (81 mg total) by mouth daily.     atorvastatin (LIPITOR) 80 MG tablet TAKE 1 TABLET BY MOUTH DAILY AT 6 PM. 90 tablet 1   clopidogrel (PLAVIX) 75 MG tablet Take 1 tablet (75 mg total) by mouth daily. 90 tablet 3   finasteride (PROSCAR) 5 MG tablet Take 5 mg by mouth daily.     levothyroxine (SYNTHROID) 137 MCG tablet Take 1 tablet (137 mcg total) by mouth daily before breakfast. 90 tablet 3   metFORMIN (GLUCOPHAGE) 500 MG tablet Take 2 tablets (1,000 mg total) by mouth daily with breakfast. 60 tablet 1   metoprolol succinate (TOPROL-XL) 25 MG 24 hr tablet TAKE 1 TABLET (25 MG TOTAL) BY MOUTH DAILY. 90 tablet 3   nitroGLYCERIN (NITROSTAT) 0.4 MG SL tablet PLACE 1 TABLET (0.4 MG TOTAL) UNDER THE TONGUE EVERY 5 (FIVE) MINUTES AS NEEDED FOR CHEST PAIN. 25 tablet 3   telmisartan (MICARDIS) 80 MG tablet TAKE 1 TABLET BY MOUTH EVERY DAY 90 tablet 3   No current facility-administered medications on file prior to visit.    Review of Systems  Constitutional:  Negative for chills and fever.  HENT:  Positive for mouth sores, sore throat and trouble swallowing (Due to pain, but can swallow-throat does not feel swollen). Negative for drooling and  nosebleeds.        No nasal sores  Eyes:  Negative for visual disturbance.  Respiratory:  Negative for cough, chest tightness, shortness of breath and wheezing.   Cardiovascular:  Negative for chest pain and palpitations.  Gastrointestinal:  Negative for abdominal pain, constipation and diarrhea.  Genitourinary:  Negative for difficulty urinating, dysuria and hematuria.  Neurological:  Negative for light-headedness and headaches.       Objective:   Vitals:   07/03/23 1504  BP: 112/80  Pulse: 70  SpO2: 95%   BP Readings from Last 3 Encounters:  07/03/23 112/80  06/23/23 129/78  06/17/23 124/80   Wt Readings from Last 3 Encounters:  07/03/23 246 lb (111.6 kg)  06/23/23 249 lb (112.9 kg)  06/17/23 253 lb 8 oz (115 kg)   Body mass index is 34.31 kg/m.    Physical Exam Constitutional:      General: He is not in acute distress.    Appearance: Normal appearance. He is not ill-appearing.  HENT:     Head: Normocephalic and atraumatic.     Mouth/Throat:     Mouth: Mucous membranes are dry.     Comments: Tongue with white coating, ulcerations on sides of tongue.  Ulcerations inside of lower lip Eyes:     Conjunctiva/sclera: Conjunctivae normal.  Cardiovascular:     Rate and Rhythm: Normal rate and regular rhythm.     Heart sounds: Normal heart sounds.  Pulmonary:     Effort: Pulmonary effort is normal. No respiratory distress.     Breath sounds: Normal breath sounds. No wheezing or rales.  Musculoskeletal:     Cervical back: Neck supple. No tenderness.     Right lower leg: No edema.     Left lower leg: No edema.  Lymphadenopathy:     Cervical: No cervical adenopathy.  Skin:    General: Skin is warm and dry.     Findings: No rash.  Neurological:     Mental Status: He is alert. Mental status is at baseline.  Psychiatric:        Mood and Affect: Mood normal.            Assessment & Plan:    See Problem List for Assessment and Plan of chronic medical  problems.

## 2023-07-03 NOTE — Patient Instructions (Addendum)
      Medications changes include :   prednisone 50 mg daily x 5 days.  Triamcinolone paste to ulcers, magic mouth wash       Return if symptoms worsen or fail to improve.

## 2023-07-08 ENCOUNTER — Ambulatory Visit
Admission: RE | Admit: 2023-07-08 | Discharge: 2023-07-08 | Disposition: A | Discharge status: Home / Self Care | Payer: Medicare Other | Source: Ambulatory Visit | Attending: Internal Medicine | Admitting: Internal Medicine

## 2023-07-08 DIAGNOSIS — R7989 Other specified abnormal findings of blood chemistry: Secondary | ICD-10-CM

## 2023-07-09 LAB — ANTI-SMOOTH MUSCLE ANTIBODY, IGG: Actin (Smooth Muscle) Antibody (IGG): 41 U — ABNORMAL HIGH (ref <20)

## 2023-07-09 LAB — HEPATITIS B SURFACE ANTIBODY,QUALITATIVE: Hep B S Ab: NONREACTIVE

## 2023-07-09 LAB — ANTI-NUCLEAR AB-TITER (ANA TITER)
ANA TITER: 1:320 {titer} — ABNORMAL HIGH
ANA Titer 1: 1:1280 {titer} — ABNORMAL HIGH

## 2023-07-09 LAB — HEPATITIS A ANTIBODY, IGM: Hep A IgM: NONREACTIVE

## 2023-07-09 LAB — HEPATITIS C ANTIBODY: Hepatitis C Ab: NONREACTIVE

## 2023-07-09 LAB — HEPATITIS B CORE ANTIBODY, IGM: Hep B C IgM: NONREACTIVE

## 2023-07-09 LAB — HEPATITIS B SURFACE ANTIGEN: Hepatitis B Surface Ag: NONREACTIVE

## 2023-07-09 LAB — ANA: Anti Nuclear Antibody (ANA): POSITIVE — AB

## 2023-07-13 ENCOUNTER — Encounter: Payer: Self-pay | Admitting: Internal Medicine

## 2023-07-13 NOTE — Addendum Note (Signed)
Addended by: Pincus Sanes on: 07/13/2023 08:43 PM   Modules accepted: Orders

## 2023-07-13 NOTE — Addendum Note (Signed)
Addended by: Pincus Sanes on: 07/13/2023 08:39 PM   Modules accepted: Orders

## 2023-07-14 ENCOUNTER — Telehealth: Payer: Self-pay | Admitting: Internal Medicine

## 2023-07-14 NOTE — Telephone Encounter (Signed)
Called and left a detailed message on patient's voicemail to confirm appointment on 07/21/23

## 2023-07-14 NOTE — Telephone Encounter (Signed)
Pt scheduled to see Hyacinth Meeker PA 07/21/23 at 9am.

## 2023-07-14 NOTE — Telephone Encounter (Signed)
Urgent referral in [place] from PCP for elevated LFT's.  Last saw Dr. Rhea Belton in 2016.  Please review and advise urgency and scheduling.  Thanks

## 2023-07-21 ENCOUNTER — Telehealth: Payer: Self-pay | Admitting: *Deleted

## 2023-07-21 ENCOUNTER — Encounter: Payer: Self-pay | Admitting: Physician Assistant

## 2023-07-21 ENCOUNTER — Ambulatory Visit (INDEPENDENT_AMBULATORY_CARE_PROVIDER_SITE_OTHER): Payer: Medicare Other | Admitting: Physician Assistant

## 2023-07-21 ENCOUNTER — Other Ambulatory Visit (INDEPENDENT_AMBULATORY_CARE_PROVIDER_SITE_OTHER): Payer: Medicare Other

## 2023-07-21 VITALS — BP 128/72 | HR 66 | Ht 71.0 in | Wt 248.0 lb

## 2023-07-21 DIAGNOSIS — K760 Fatty (change of) liver, not elsewhere classified: Secondary | ICD-10-CM

## 2023-07-21 DIAGNOSIS — R7989 Other specified abnormal findings of blood chemistry: Secondary | ICD-10-CM

## 2023-07-21 LAB — FERRITIN: Ferritin: 20.6 ng/mL — ABNORMAL LOW (ref 22.0–322.0)

## 2023-07-21 NOTE — Progress Notes (Signed)
Chief Complaint: Elevated LFTs  HPI:    Mr. [name] is a 72 year old male with a past medical history as listed below including reflux, A-fib on Plavix, CHF (12/31/2018 echo with LVEF 65-70%), osteoarthritis and multiple others, known to Dr. Rhea Belton, who was referred to me by Pincus Sanes, MD for a complaint of elevated LFTs.      04/30/2015 colonoscopy for surveillance due to prior colonic neoplasia was normal.  Repeat recommended in 10 years.    06/17/2023 CMP with AST 117, ALT 155 (02/24/2023 AST 42 and ALT 47, 10/23/2022 AST 51 and ALT 61, 06/09/2022 normal).    07/03/2023 AST 63 and ALT 104.  ANA positive, ASMA elevated at 41.  Hepatitis testing negative.    07/08/2023 right upper quadrant ultrasound with increased hepatic parenchymal echogenicity suggestive of steatosis and otherwise normal.    Today, patient presents to clinic accompanied by his wife who assists with his history.  He explains to me that he really feels completely fine but was told that he had elevated liver enzymes.  Apparently these were first noticed when he went to a weight loss clinic a couple of years ago and then after he lost about 20 pounds they normalized and recently have started to go back up per him.  He has no family history of liver disease or prior history of hepatitis or tattoos.  They have had testing as above and have some questions.    Denies fever, chills or weight loss.  Past Medical History:  Diagnosis Date   Back pain    BPH (benign prostatic hypertrophy)    Chest pain    COLONIC POLYPS, HX OF 2007   clear colo w/o polyps 04/2015: 52yr follow up   Constipation    Coronary artery disease    Decreased mobility    due to BL knee and hip replacement   Fatigue    GERD (gastroesophageal reflux disease)    Heartburn    History of kidney stones    passed   Hyperlipidemia    HYPERTENSION    HYPOTHYROIDISM    Joint pain    Myocardial infarction (HCC) 12/2018   Inferior STEMI   OA (osteoarthritis)     Knees   OBESITY    OSA treated with BiPAP    PAF (paroxysmal atrial fibrillation) (HCC)    Pneumonia    Pre-diabetes    Shortness of breath on exertion    Umbilical hernia    Ventral hernia     Past Surgical History:  Procedure Laterality Date   arthroscopic knee surgery     (R) 2003 & (L) 2010   CARDIAC CATHETERIZATION     COLONOSCOPY  04/2015   no polyps ([place])   CORONARY/GRAFT ACUTE MI REVASCULARIZATION N/A 12/30/2018   Procedure: Coronary/Graft Acute MI Revascularization;  Surgeon: Lyn Records, MD;  Location: [facility];  Service: Cardiovascular;  Laterality: N/A;   HIP CLOSED REDUCTION Right 03/12/2021   Procedure: CLOSED REDUCTION HIP;  Surgeon: Venita Lick, MD;  Location: [facility];  Service: Orthopedics;  Laterality: Right;   LAMINECTOMY  1991   LEFT HEART CATH AND CORONARY ANGIOGRAPHY N/A 12/30/2018   Procedure: LEFT HEART CATH AND CORONARY ANGIOGRAPHY;  Surgeon: Lyn Records, MD;  Location: [facility];  Service: Cardiovascular;  Laterality: N/A;   TONSILLECTOMY  1990   w/ adenoids and uvula   TONSILLECTOMY     TOTAL HIP ARTHROPLASTY Right 01/2011   [place]   TOTAL HIP  ARTHROPLASTY Left 04/16/2016   Procedure: TOTAL HIP ARTHROPLASTY ANTERIOR APPROACH;  Surgeon: Ollen Gross, MD;  Location: [facility];  Service: Orthopedics;  Laterality: Left;   TOTAL HIP REVISION Right 01/04/2020   Procedure: Right hip bearing surface;  Surgeon: Ollen Gross, MD;  Location: [facility];  Service: Orthopedics;  Laterality: Right;    TOTAL HIP REVISION Right 04/15/2021   Procedure: Right hip bearing surface revision;  Surgeon: Ollen Gross, MD;  Location: [facility];  Service: Orthopedics;  Laterality: Right;    TOTAL KNEE ARTHROPLASTY  12/27/2012   Procedure: TOTAL KNEE BILATERAL;  Surgeon: Loanne Drilling, MD;  Location: [facility];  Service: Orthopedics;  Laterality: Bilateral;   UMBILICAL HERNIA REPAIR N/A 03/11/2023   Procedure: HERNIA REPAIR UMBILICAL ADULT;   Surgeon: Sebastian Ache, MD;  Location: [facility];  Service: Urology;  Laterality: N/A;   WISDOM TOOTH EXTRACTION     XI ROBOTIC ASSISTED SIMPLE PROSTATECTOMY N/A 03/11/2023   Procedure: XI ROBOTIC ASSISTED SIMPLE PROSTATECTOMY;  Surgeon: Sebastian Ache, MD;  Location: [facility];  Service: Urology;  Laterality: N/A;  3 HRS    Current Outpatient Medications  Medication Sig Dispense Refill   aspirin 81 MG chewable tablet Chew 1 tablet (81 mg total) by mouth daily.     atorvastatin (LIPITOR) 80 MG tablet TAKE 1 TABLET BY MOUTH DAILY AT 6 PM. 90 tablet 1   clopidogrel (PLAVIX) 75 MG tablet Take 1 tablet (75 mg total) by mouth daily. 90 tablet 3   finasteride (PROSCAR) 5 MG tablet Take 5 mg by mouth daily.     levothyroxine (SYNTHROID) 137 MCG tablet Take 1 tablet (137 mcg total) by mouth daily before breakfast. 90 tablet 3   metFORMIN (GLUCOPHAGE) 500 MG tablet Take 2 tablets (1,000 mg total) by mouth daily with breakfast. 60 tablet 1   metoprolol succinate (TOPROL-XL) 25 MG 24 hr tablet TAKE 1 TABLET (25 MG TOTAL) BY MOUTH DAILY. 90 tablet 3   nitroGLYCERIN (NITROSTAT) 0.4 MG SL tablet PLACE 1 TABLET (0.4 MG TOTAL) UNDER THE TONGUE EVERY 5 (FIVE) MINUTES AS NEEDED FOR CHEST PAIN. 25 tablet 3   telmisartan (MICARDIS) 80 MG tablet TAKE 1 TABLET BY MOUTH EVERY DAY 90 tablet 3   triamcinolone (KENALOG) 0.1 % paste Use as directed 1 Application in the mouth or throat 2 (two) times daily. 5 g 1   No current facility-administered medications for this visit.    Allergies as of 07/21/2023 - Review Complete 07/21/2023  Allergen Reaction Noted   Bactrim [sulfamethoxazole-trimethoprim] Other (See Comments) 07/03/2023    Family History  Problem Relation Age of Onset   Heart disease Father 45       CABG age 27   Hypertension Father    Diabetes Father    Hyperlipidemia Father    Cancer Father    Dementia Mother        ?ALS vs SNP   ALS Maternal Grandfather    Arthritis Other        Parent &  grandparents   Colon cancer Neg Hx     Social History   Socioeconomic History   Marital status: Married    Spouse name: Langley Ingalls   Number of children: Not on file   Years of education: Not on file   Highest education level: Some college, no degree  Occupational History   Occupation: Retired  Tobacco Use   Smoking status: Never   Smokeless tobacco: Never  Vaping Use   Vaping status: Never  Used  Substance and Sexual Activity   Alcohol use: Not Currently    Comment: rarely, social   Drug use: No   Sexual activity: Not on file  Other Topics Concern   Not on file  Social History Narrative   Working part time, contemplating retirement end of 2016   Lives at home with spouse      Exercise: water exercises   Social Determinants of Health   Financial Resource Strain: Low Risk  (02/17/2022)   Overall Financial Resource Strain (CARDIA)    Difficulty of Paying Living Expenses: Not hard at all  Food Insecurity: No Food Insecurity (06/13/2023)   Hunger Vital Sign    Worried About Running Out of Food in the Last Year: Never true    Ran Out of Food in the Last Year: Never true  Transportation Needs: No Transportation Needs (06/13/2023)   PRAPARE - Administrator, Civil Service (Medical): No    Lack of Transportation (Non-Medical): No  Physical Activity: Sufficiently Active (06/13/2023)   Exercise Vital Sign    Days of Exercise per Week: 5 days    Minutes of Exercise per Session: 60 min  Stress: No Stress Concern Present (06/13/2023)   [employer] of Occupational Health - Occupational Stress Questionnaire    Feeling of Stress : Only a little  Social Connections: Socially Integrated (06/13/2023)   Social Connection and Isolation Panel [NHANES]    Frequency of Communication with Friends and Family: Twice a week    Frequency of Social Gatherings with Friends and Family: Once a week    Attends Religious Services: More than 4 times per year    Active Member of [employer]  or Organizations: Yes    Attends Banker Meetings: Not on file    Marital Status: Married  Intimate Partner Violence: Not At Risk (03/11/2023)   Humiliation, Afraid, Rape, and Kick questionnaire    Fear of Current or Ex-Partner: No    Emotionally Abused: No    Physically Abused: No    Sexually Abused: No    Review of Systems:    Constitutional: No weight loss, fever or chills Skin: No rash Cardiovascular: No chest pain Respiratory: No SOB Gastrointestinal: See HPI and otherwise negative Genitourinary: No dysuria  Neurological: No headache, dizziness or syncope Musculoskeletal: No new muscle or joint pain Hematologic: No bleeding  Psychiatric: No history of depression or anxiety   Physical Exam:  Vital signs: BP 128/72   Pulse 66   Ht 5\' 11"  (1.803 m)   Wt 248 lb (112.5 kg)   SpO2 96%   BMI 34.59 kg/m    Constitutional:   Pleasant Caucasian male appears to be in NAD, Well developed, Well nourished, alert and cooperative Head:  Normocephalic and atraumatic. Eyes:   PEERL, EOMI. No icterus. Conjunctiva pink. Ears:  Normal auditory acuity. Neck:  Supple Throat: Oral cavity and pharynx without inflammation, swelling or lesion.  Respiratory: Respirations even and unlabored. Lungs clear to auscultation bilaterally.   No wheezes, crackles, or rhonchi.  Cardiovascular: Normal S1, S2. No MRG. Regular rate and rhythm. No peripheral edema, cyanosis or pallor.  Gastrointestinal:  Soft, nondistended, nontender. No rebound or guarding. Normal bowel sounds. No appreciable masses or hepatomegaly. Rectal:  Not performed.  Msk:  Symmetrical without gross deformities. Without edema, no deformity or joint abnormality.  Neurologic:  Alert and  oriented x4;  grossly normal neurologically.  Skin:   Dry and intact without significant lesions or rashes. Psychiatric:  Demonstrates  good judgement and reason without abnormal affect or behaviors.  RELEVANT LABS AND IMAGING: CBC     Component Value Date/Time   WBC 4.3 06/17/2023 1128   RBC 5.03 06/17/2023 1128   HGB 13.5 06/17/2023 1128   HCT 41.6 06/17/2023 1128   PLT 183.0 06/17/2023 1128   MCV 82.8 06/17/2023 1128   MCH 29.7 03/05/2023 0837   MCHC 32.3 06/17/2023 1128   RDW 15.3 06/17/2023 1128   LYMPHSABS 1.0 06/17/2023 1128   MONOABS 0.7 06/17/2023 1128   EOSABS 0.1 06/17/2023 1128   BASOSABS 0.0 06/17/2023 1128    CMP     Component Value Date/Time   NA 137 06/17/2023 1128   NA 138 02/24/2023 0841   K 4.6 06/17/2023 1128   CL 103 06/17/2023 1128   CO2 26 06/17/2023 1128   GLUCOSE 97 06/17/2023 1128   BUN 17 06/17/2023 1128   BUN 15 02/24/2023 0841   CREATININE 0.90 06/17/2023 1128   CALCIUM 9.8 06/17/2023 1128   PROT 8.3 07/03/2023 1540   PROT 7.3 02/24/2023 0841   ALBUMIN 4.5 07/03/2023 1540   ALBUMIN 4.4 02/24/2023 0841   AST 63 (H) 07/03/2023 1540   ALT 104 (H) 07/03/2023 1540   ALKPHOS 81 07/03/2023 1540   BILITOT 0.8 07/03/2023 1540   BILITOT 0.7 02/24/2023 0841   GFRNONAA >60 03/12/2023 0405   GFRAA 102 01/01/2021 0925    Assessment: 1.  Elevated LFTs: Elevated consistently since October of last year, recent ultrasound with fatty liver but ASMA and ANA positive; concern for autoimmune hepatitis versus fatty liver versus other 2.  Fatty liver  Plan: 1. Ordered labs to include IGG, Ferritin and AMA 2.  Scheduled patient for ultrasound-guided liver biopsy for further evaluation of possible autoimmune hepatitis versus fatty liver.  Patient was advised that he will need to hold his Plavix for 5 days prior to time of this procedure and hold it for 48 hours after.  We will communicate with his prescribing physician to ensure this is acceptable for him. 3.  Answered all the patient's questions. 4.  Patient to follow in clinic per recommendations after imaging/labs above.  Hyacinth Meeker, PA-C [facility] 07/21/2023, 8:51 AM  Cc: Pincus Sanes, MD

## 2023-07-21 NOTE — Patient Instructions (Signed)
Your provider has requested that you go to the basement level for lab work before leaving today. Press "B" on the elevator. The lab is located at the first door on the left as you exit the elevator.   You will be contacted by [facility] Scheduling (Your caller ID will indicate phone # [phone]) within the next business 7-10 business days to schedule your Liver Biopsy. If you have not heard from them within 7-10 business days, please call [facility] Scheduling at [phone] to follow up on the status of your appointment.    Hold Plavix 5 days prior to biopsy and 48 hours afterwards (clearance will be obtained prior to biopsy).   _______________________________________________________  If your blood pressure at your visit was 140/90 or greater, please contact your primary care physician to follow up on this.  _______________________________________________________  If you are age 13 or older, your body mass index should be between 23-30. Your Body mass index is 34.59 kg/m. If this is out of the aforementioned range listed, please consider follow up with your Primary Care Provider.  If you are age 16 or younger, your body mass index should be between 19-25. Your Body mass index is 34.59 kg/m. If this is out of the aformentioned range listed, please consider follow up with your Primary Care Provider.   ________________________________________________________  The [facility] providers would like to encourage you to use [facility] to communicate with providers for non-urgent requests or questions.  Due to long hold times on the telephone, sending your provider a message by [facility] may be a faster and more efficient way to get a response.  Please allow 48 business hours for a response.  Please remember that this is for non-urgent requests.  _______________________________________________________

## 2023-07-21 NOTE — Telephone Encounter (Signed)
S/w pt's spouse per (DPR). Scheduled appt with Jari Favre, PA on 07/31/23.  Pre op added to appt notes.  Will remove from pre op pool.

## 2023-07-21 NOTE — Telephone Encounter (Signed)
   Pre-operative Risk Assessment    Patient Name: [name]  DOB: [birth year] MRN: [id number]      Request for Surgical Clearance    Procedure:   [place] live BX  Date of Surgery:  Clearance 08/05/23                                 Surgeon:  [facility] Surgeon's Group or Practice Name:  [facility] Phone number:  [phone] Fax number:  [phone]   Type of Clearance Requested:   - Medical  - Pharmacy:  Hold Aspirin and Clopidogrel (Plavix) 48 hours prior.   Type of Anesthesia:  Not Indicated   Additional requests/questions:    Signed, Emmit Pomfret   07/21/2023, 12:11 PM

## 2023-07-21 NOTE — Telephone Encounter (Signed)
   Name: [name]  DOB: 01/02/1951  MRN: [id number]  Primary Cardiologist: Armanda Magic, MD  Chart reviewed as part of pre-operative protocol coverage. Because of [name] past medical history and time since last visit, he will require a follow-up in-office visit in order to better assess preoperative cardiovascular risk.  Patient is overdue for an office follow-up.  Pre-op covering staff: - Please schedule appointment and call patient to inform them. If patient already had an upcoming appointment within acceptable timeframe, please add "pre-op clearance" to the appointment notes so provider is aware. - Please contact requesting surgeon's office via preferred method (i.e, phone, fax) to inform them of need for appointment prior to surgery.  Per office protocol, he may hold Plavix for 5 days prior to procedure. Please resume Plavix as soon as possible postprocedure, at the discretion of the surgeon. Regarding ASA therapy, we recommend continuation of ASA throughout the perioperative period.  However, if the surgeon feels that cessation of ASA is required in the perioperative period, it may be stopped 5-7 days prior to surgery with a plan to resume it as soon as felt to be feasible from a surgical standpoint in the post-operative period.    Joylene Grapes, NP  07/21/2023, 12:39 PM

## 2023-07-21 NOTE — Progress Notes (Signed)
Addendum: Reviewed and agree with assessment and management plan. Please include me on the pathology results once available. Thanks Aleshia Cartelli, Carie Caddy, MD

## 2023-07-22 LAB — IGG: IgG (Immunoglobin G), Serum: 1495 mg/dL (ref 600–1540)

## 2023-07-30 NOTE — Progress Notes (Signed)
Cardiology Office Note:  .   Date:  07/31/2023  ID:  [name], DOB Apr 19, 1951, MRN [id number] PCP: Pincus Sanes, MD  [facility] Providers Cardiologist:  Armanda Magic, MD   History of Present Illness: Marland Kitchen   [name] is a 72 y.o. male with past medical history of CAD status post DES PDA in 12/2018, CTO circumflex, hypertension, hyperlipidemia and OSA who is here for follow-up appointment.  Seen by Dr. Mayford Knife 05/27/2022.  At that time he was doing well.  Was having episodes of sleep apnea and A-fib during a urologic procedure.  He had not had a recurrence and confirmed that he has never been on a DOAC.  Patient has undergone several different procedures this past year and has seen [place] for preop clearance.  Today, he presents for preop clearance of a ultrasound-guided biopsy by [facility] imaging.  This is to take place on 08/05/2023. He did take plavix this morning but is asked to stop until the biopsy. No chest pains or SOB. Feels good today and working out lifting weights regularly.  He surpasses 4 METS on the DASI.  Plavix 5 day hold, continue asa.  Please restart Plavix when medically safe to do so.  Reports no shortness of breath nor dyspnea on exertion. Reports no chest pain, pressure, or tightness. No edema, orthopnea, PND. Reports no palpitations.   ROS: Pertinent ROS in HPI.  Studies Reviewed: Marland Kitchen   EKG Interpretation Date/Time:  Friday July 31 2023 08:47:26 EDT Ventricular Rate:  65 PR Interval:  176 QRS Duration:  92 QT Interval:  414 QTC Calculation: 430 R Axis:   -6  Text Interpretation: Normal sinus rhythm Normal ECG When compared with ECG of 31-Dec-2018 06:56, No significant change was found Confirmed by Jari Favre [phone]) on 07/31/2023 9:11:23 AM   Echocardiogram 12/31/18 Study Conclusions   - Left ventricle: The cavity size was normal. Wall thickness was    increased in a pattern of mild LVH. Systolic function was    vigorous. The estimated ejection  fraction was in the range of 65%    to 70%. Wall motion was normal; there were no regional wall    motion abnormalities. Doppler parameters are consistent with    abnormal left ventricular relaxation (grade 1 diastolic    dysfunction).  - Ventricular septum: The contour showed systolic flattening.  - Mitral valve: There was trivial regurgitation.  - Left atrium: The atrium was mildly dilated.   Physical Exam:   VS:  BP 126/64   Pulse 65   Ht 5\' 11"  (1.803 m)   Wt 251 lb 12.8 oz (114.2 kg)   SpO2 97%   BMI 35.12 kg/m    Wt Readings from Last 3 Encounters:  07/31/23 251 lb 12.8 oz (114.2 kg)  07/21/23 248 lb (112.5 kg)  07/03/23 246 lb (111.6 kg)    GEN: Well nourished, well developed in no acute distress NECK: No JVD; No carotid bruits CARDIAC: RRR, no murmurs, rubs, gallops RESPIRATORY:  Clear to auscultation without rales, wheezing or rhonchi  ABDOMEN: Soft, non-tender, non-distended EXTREMITIES:  No edema; No deformity   ASSESSMENT AND PLAN: .   Preop clearance  [name] perioperative risk of a major cardiac event is 0.9% according to the Revised Cardiac Risk Index (RCRI).  Therefore, he is at low risk for perioperative complications.   His functional capacity is excellent at 8.27 METs according to the Duke Activity Status Index (DASI). Recommendations: According to ACC/AHA guidelines, no  further cardiovascular testing needed.  The patient may proceed to surgery at acceptable risk.   Antiplatelet and/or Anticoagulation Recommendations: The patient should remain on Aspirin without interruption.   Clopidogrel (Plavix) can be held for 5 days prior to his surgery and resumed as soon as possible post op.  CAD s/p STEMI -Continue aspirin 81 mg daily, Lipitor 80 mg daily, Plavix 75 mg daily, metoprolol succinate 25 mg daily, nitro as needed, myocarditis 80 mg daily -No chest pain  PAF? -Not to his knowledge  CPAP/OSA -compliant, continue  HTN -Blood pressure  well-controlled, 126/64 -Continue current medications -continue low sodium diet     Dispo: Cleared for surgery.  He can follow-up as scheduled with Dr. Mayford Knife  Signed, Sharlene Dory, PA-C

## 2023-07-31 ENCOUNTER — Ambulatory Visit: Payer: Medicare Other | Attending: Physician Assistant | Admitting: Physician Assistant

## 2023-07-31 ENCOUNTER — Encounter: Payer: Self-pay | Admitting: Physician Assistant

## 2023-07-31 VITALS — BP 126/64 | HR 65 | Ht 71.0 in | Wt 251.8 lb

## 2023-07-31 DIAGNOSIS — I2510 Atherosclerotic heart disease of native coronary artery without angina pectoris: Secondary | ICD-10-CM

## 2023-07-31 DIAGNOSIS — Z9861 Coronary angioplasty status: Secondary | ICD-10-CM | POA: Insufficient documentation

## 2023-07-31 DIAGNOSIS — G4733 Obstructive sleep apnea (adult) (pediatric): Secondary | ICD-10-CM

## 2023-07-31 DIAGNOSIS — I10 Essential (primary) hypertension: Secondary | ICD-10-CM

## 2023-07-31 NOTE — Patient Instructions (Signed)
Medication Instructions:  Your physician recommends that you continue on your current medications as directed. Please refer to the Current Medication list given to you today.  *If you need a refill on your cardiac medications before your next appointment, please call your pharmacy*  Lab Work: If you have labs (blood work) drawn today and your tests are completely normal, you will receive your results only by: MyChart Message (if you have MyChart) OR A paper copy in the mail If you have any lab test that is abnormal or we need to change your treatment, we will call you to review the results.  Testing/Procedures: None ordered today.  Follow-Up: At [facility], you and your health needs are our priority.  As part of our continuing mission to provide you with exceptional heart care, we have created designated Provider Care Teams.  These Care Teams include your primary Cardiologist (physician) and Advanced Practice Providers (APPs -  Physician Assistants and Nurse Practitioners) who all work together to provide you with the care you need, when you need it.  We recommend signing up for the patient portal called "MyChart".  Sign up information is provided on this After Visit Summary.  MyChart is used to connect with patients for Virtual Visits (Telemedicine).  Patients are able to view lab/test results, encounter notes, upcoming appointments, etc.  Non-urgent messages can be sent to your provider as well.   To learn more about what you can do with MyChart, go to [email or web address].    Your next appointment:   Keep your already scheduled appointment  Provider:   Armanda Magic, MD

## 2023-08-04 ENCOUNTER — Other Ambulatory Visit: Payer: Self-pay | Admitting: Radiology

## 2023-08-04 DIAGNOSIS — R7989 Other specified abnormal findings of blood chemistry: Secondary | ICD-10-CM

## 2023-08-04 NOTE — Progress Notes (Signed)
Chief Complaint: Patient was seen in consultation today for image guided random core liver biopsy  Referring Physician(s): Lemmon,[name] Orlie Pollen  Supervising Physician: Roanna Banning  Patient Status: [facility] - Out-pt  History of Present Illness: [name] is a 73 y.o. male with past medical history significant for BPH, colon polyps, coronary artery disease with prior MI/stenting/CABG, CHF, GERD, nephrolithiasis, hypertension, hyperlipidemia, hypothyroidism, osteoarthritis, sleep apnea, paroxysmal atrial fibrillation, prediabetes who presents now with persistently elevated LFTs and fatty liver by imaging along with + ASMA/ANA.  He is scheduled today for image guided random core liver biopsy for further evaluation/rule out autoimmune hepatitis.  Past Medical History:  Diagnosis Date   Back pain    BPH (benign prostatic hypertrophy)    Chest pain    COLONIC POLYPS, HX OF 2007   clear colo w/o polyps 04/2015: 43yr follow up   Constipation    Coronary artery disease    Decreased mobility    due to BL knee and hip replacement   Fatigue    GERD (gastroesophageal reflux disease)    Heartburn    History of kidney stones    passed   Hyperlipidemia    HYPERTENSION    HYPOTHYROIDISM    Joint pain    Myocardial infarction (HCC) 12/2018   Inferior STEMI   OA (osteoarthritis)    Knees   OBESITY    OSA treated with BiPAP    PAF (paroxysmal atrial fibrillation) (HCC)    Pneumonia    Pre-diabetes    Shortness of breath on exertion    Umbilical hernia    Ventral hernia     Past Surgical History:  Procedure Laterality Date   arthroscopic knee surgery     (R) 2003 & (L) 2010   CARDIAC CATHETERIZATION     COLONOSCOPY  04/2015   no polyps ([place])   CORONARY/GRAFT ACUTE MI REVASCULARIZATION N/A 12/30/2018   Procedure: Coronary/Graft Acute MI Revascularization;  Surgeon: Lyn Records, MD;  Location: [facility] INVASIVE CV LAB;  Service: Cardiovascular;  Laterality: N/A;   HIP CLOSED  REDUCTION Right 03/12/2021   Procedure: CLOSED REDUCTION HIP;  Surgeon: Venita Lick, MD;  Location: [facility] ORS;  Service: Orthopedics;  Laterality: Right;   LAMINECTOMY  1991   LEFT HEART CATH AND CORONARY ANGIOGRAPHY N/A 12/30/2018   Procedure: LEFT HEART CATH AND CORONARY ANGIOGRAPHY;  Surgeon: Lyn Records, MD;  Location: [facility] INVASIVE CV LAB;  Service: Cardiovascular;  Laterality: N/A;   TONSILLECTOMY  1990   w/ adenoids and uvula   TONSILLECTOMY     TOTAL HIP ARTHROPLASTY Right 01/2011   [place]   TOTAL HIP ARTHROPLASTY Left 04/16/2016   Procedure: TOTAL HIP ARTHROPLASTY ANTERIOR APPROACH;  Surgeon: Ollen Gross, MD;  Location: [facility];  Service: Orthopedics;  Laterality: Left;   TOTAL HIP REVISION Right 01/04/2020   Procedure: Right hip bearing surface;  Surgeon: Ollen Gross, MD;  Location: [facility];  Service: Orthopedics;  Laterality: Right;    TOTAL HIP REVISION Right 04/15/2021   Procedure: Right hip bearing surface revision;  Surgeon: Ollen Gross, MD;  Location: [facility];  Service: Orthopedics;  Laterality: Right;    TOTAL KNEE ARTHROPLASTY  12/27/2012   Procedure: TOTAL KNEE BILATERAL;  Surgeon: Loanne Drilling, MD;  Location: [facility];  Service: Orthopedics;  Laterality: Bilateral;   UMBILICAL HERNIA REPAIR N/A 03/11/2023   Procedure: HERNIA REPAIR UMBILICAL ADULT;  Surgeon: Sebastian Ache, MD;  Location: [facility];  Service: Urology;  Laterality: N/A;  WISDOM TOOTH EXTRACTION     XI ROBOTIC ASSISTED SIMPLE PROSTATECTOMY N/A 03/11/2023   Procedure: XI ROBOTIC ASSISTED SIMPLE PROSTATECTOMY;  Surgeon: Sebastian Ache, MD;  Location: [facility];  Service: Urology;  Laterality: N/A;  3 HRS    Allergies: Bactrim [sulfamethoxazole-trimethoprim]  Medications: Prior to Admission medications   Medication Sig Start Date End Date Taking? Authorizing Provider  aspirin 81 MG chewable tablet Chew 1 tablet (81 mg total) by mouth daily. 06/17/23   Pincus Sanes, MD  atorvastatin  (LIPITOR) 80 MG tablet TAKE 1 TABLET BY MOUTH DAILY AT 6 PM. 04/23/23   Quintella Reichert, MD  clopidogrel (PLAVIX) 75 MG tablet Take 1 tablet (75 mg total) by mouth daily. 06/17/23   Pincus Sanes, MD  docusate sodium (COLACE) 100 MG capsule Take 100 mg by mouth daily.    [provider]  finasteride (PROSCAR) 5 MG tablet Take 5 mg by mouth daily. 02/11/23   [provider]  levothyroxine (SYNTHROID) 137 MCG tablet Take 1 tablet (137 mcg total) by mouth daily before breakfast. 06/12/22   Pincus Sanes, MD  metFORMIN (GLUCOPHAGE) 500 MG tablet Take 2 tablets (1,000 mg total) by mouth daily with breakfast. 06/23/23   Bowen, Scot Jun, DO  metoprolol succinate (TOPROL-XL) 25 MG 24 hr tablet TAKE 1 TABLET (25 MG TOTAL) BY MOUTH DAILY. 05/27/23   Quintella Reichert, MD  nitroGLYCERIN (NITROSTAT) 0.4 MG SL tablet PLACE 1 TABLET (0.4 MG TOTAL) UNDER THE TONGUE EVERY 5 (FIVE) MINUTES AS NEEDED FOR CHEST PAIN. 11/19/20   Quintella Reichert, MD  telmisartan (MICARDIS) 80 MG tablet TAKE 1 TABLET BY MOUTH EVERY DAY 02/12/23   Quintella Reichert, MD     Family History  Problem Relation Age of Onset   Dementia Mother        ?ALS vs SNP   Heart disease Father 83       CABG age 70   Hypertension Father    Diabetes Father    Hyperlipidemia Father    Melanoma Father    Pancreatic cancer Sister    ALS Maternal Grandfather    Arthritis Other        Parent & grandparents   Colon cancer Neg Hx    Stomach cancer Neg Hx    Esophageal cancer Neg Hx     Social History   Socioeconomic History   Marital status: Married    Spouse name: Gabe Gensemer   Number of children: Not on file   Years of education: Not on file   Highest education level: Some college, no degree  Occupational History   Occupation: Retired  Tobacco Use   Smoking status: Never   Smokeless tobacco: Never  Vaping Use   Vaping status: Never Used  Substance and Sexual Activity   Alcohol use: Yes    Comment: rarely, social   Drug use:  No   Sexual activity: Not on file  Other Topics Concern   Not on file  Social History Narrative   Working part time, contemplating retirement end of 2016   Lives at home with spouse      Exercise: water exercises   Social Determinants of Health   Financial Resource Strain: Low Risk  (02/17/2022)   Overall Financial Resource Strain (CARDIA)    Difficulty of Paying Living Expenses: Not hard at all  Food Insecurity: No Food Insecurity (06/13/2023)   Hunger Vital Sign    Worried About Running Out of Food in the Last  Year: Never true    Ran Out of Food in the Last Year: Never true  Transportation Needs: No Transportation Needs (06/13/2023)   PRAPARE - Administrator, Civil Service (Medical): No    Lack of Transportation (Non-Medical): No  Physical Activity: Sufficiently Active (06/13/2023)   Exercise Vital Sign    Days of Exercise per Week: 5 days    Minutes of Exercise per Session: 60 min  Stress: No Stress Concern Present (06/13/2023)   [employer] of Occupational Health - Occupational Stress Questionnaire    Feeling of Stress : Only a little  Social Connections: Socially Integrated (06/13/2023)   Social Connection and Isolation Panel [NHANES]    Frequency of Communication with Friends and Family: Twice a week    Frequency of Social Gatherings with Friends and Family: Once a week    Attends Religious Services: More than 4 times per year    Active Member of [employer] or Organizations: Yes    Attends Banker Meetings: Not on file    Marital Status: Married      Review of Systems  Vital Signs:   Code Status:   Advance Care Plan: {Advance Care [id number]    Physical Exam  Imaging: US Abdomen Limited RUQ (LIVER/GB)  Result Date: 07/08/2023 CLINICAL DATA:  Elevated LFTs EXAM: ULTRASOUND ABDOMEN LIMITED RIGHT UPPER QUADRANT COMPARISON:  None Available. FINDINGS: Gallbladder: No gallstones or wall thickening visualized. No sonographic Murphy sign  noted by sonographer. Common bile duct: Diameter: 6 mm, prominent. Liver: Increased echogenicity. No focal lesion. Portal vein is patent on color Doppler imaging with normal direction of blood flow towards the liver. Other: None. IMPRESSION: 1. Increased hepatic parenchymal echogenicity suggestive of steatosis. 2. No cholelithiasis or sonographic evidence for acute cholecystitis. Electronically Signed   By: Annia Belt M.D.   On: 07/08/2023 11:08    Labs:  CBC: Recent Labs    12/19/22 0834 03/05/23 0837 03/11/23 1615 03/12/23 0405 06/17/23 1128  WBC 4.4 5.7  --   --  4.3  HGB 15.1 13.7 13.4 12.2* 13.5  HCT 43.9 41.9 40.9 36.8* 41.6  PLT 122.0* 165  --   --  183.0    COAGS: No results for input(s): "INR", "APTT" in the last 8760 hours.  BMP: Recent Labs    10/23/22 1040 02/24/23 0841 03/12/23 0405 06/17/23 1128  NA 140 138 137 137  K 4.7 4.5 4.8 4.6  CL 103 103 103 103  CO2 23 21 26 26   GLUCOSE 100* 101* 141* 97  BUN 14 15 14 17   CALCIUM 9.9 9.5 8.4* 9.8  CREATININE 0.85 0.84 1.06 0.90  GFRNONAA  --   --  >60  --     LIVER FUNCTION TESTS: Recent Labs    02/24/23 0841 06/17/23 1128 06/25/23 0804 07/03/23 1540  BILITOT 0.7 0.5 0.6 0.8  AST 42* 117* 118* 63*  ALT 47* 155* 181* 104*  ALKPHOS 96 74 71 81  PROT 7.3 8.0 7.5 8.3  ALBUMIN 4.4 4.4 4.2 4.5    TUMOR MARKERS: No results for input(s): "AFPTM", "CEA", "CA199", "CHROMGRNA" in the last 8760 hours.  Assessment and Plan: 72 y.o. male with past medical history significant for BPH, colon polyps, coronary artery disease with prior MI/stenting/CABG, GERD, nephrolithiasis, hypertension, hyperlipidemia, hypothyroidism, osteoarthritis, sleep apnea, paroxysmal atrial fibrillation, prediabetes who presents now with persistently elevated LFTs and fatty liver by imaging along with positive ASMA and ANA.  He is scheduled today for image guided random core  liver biopsy for further evaluation/rule out autoimmune  hepatitis.Risks and benefits of procedure was discussed with the patient  including, but not limited to bleeding, infection, damage to adjacent structures or low yield requiring additional tests.  All of the questions were answered and there is agreement to proceed.  Consent signed and in chart.    Thank you for this interesting consult.  I greatly enjoyed meeting [name] and look forward to participating in their care.  A copy of this report was sent to the requesting provider on this date.  Electronically Signed: D. Jeananne Rama, PA-C 08/04/2023, 5:17 PM   I spent a total of  25 minutes   in face to face in clinical consultation, greater than 50% of which was counseling/coordinating care for image guided random core liver biopsy

## 2023-08-05 ENCOUNTER — Ambulatory Visit (HOSPITAL_COMMUNITY)
Admission: RE | Admit: 2023-08-05 | Discharge: 2023-08-05 | Disposition: A | Discharge status: Home / Self Care | Payer: Medicare Other | Source: Ambulatory Visit | Attending: Physician Assistant | Admitting: Physician Assistant

## 2023-08-05 ENCOUNTER — Other Ambulatory Visit: Payer: Self-pay

## 2023-08-05 DIAGNOSIS — I2510 Atherosclerotic heart disease of native coronary artery without angina pectoris: Secondary | ICD-10-CM | POA: Insufficient documentation

## 2023-08-05 DIAGNOSIS — K7400 Hepatic fibrosis, unspecified: Secondary | ICD-10-CM | POA: Diagnosis not present

## 2023-08-05 DIAGNOSIS — R7989 Other specified abnormal findings of blood chemistry: Secondary | ICD-10-CM

## 2023-08-05 DIAGNOSIS — Z955 Presence of coronary angioplasty implant and graft: Secondary | ICD-10-CM | POA: Diagnosis not present

## 2023-08-05 DIAGNOSIS — I480 Paroxysmal atrial fibrillation: Secondary | ICD-10-CM | POA: Insufficient documentation

## 2023-08-05 DIAGNOSIS — E785 Hyperlipidemia, unspecified: Secondary | ICD-10-CM | POA: Diagnosis not present

## 2023-08-05 DIAGNOSIS — R7303 Prediabetes: Secondary | ICD-10-CM | POA: Insufficient documentation

## 2023-08-05 DIAGNOSIS — K219 Gastro-esophageal reflux disease without esophagitis: Secondary | ICD-10-CM | POA: Insufficient documentation

## 2023-08-05 DIAGNOSIS — K739 Chronic hepatitis, unspecified: Secondary | ICD-10-CM | POA: Diagnosis not present

## 2023-08-05 DIAGNOSIS — Z8719 Personal history of other diseases of the digestive system: Secondary | ICD-10-CM | POA: Insufficient documentation

## 2023-08-05 DIAGNOSIS — E039 Hypothyroidism, unspecified: Secondary | ICD-10-CM | POA: Insufficient documentation

## 2023-08-05 DIAGNOSIS — I252 Old myocardial infarction: Secondary | ICD-10-CM | POA: Diagnosis not present

## 2023-08-05 DIAGNOSIS — I110 Hypertensive heart disease with heart failure: Secondary | ICD-10-CM | POA: Insufficient documentation

## 2023-08-05 DIAGNOSIS — I509 Heart failure, unspecified: Secondary | ICD-10-CM | POA: Insufficient documentation

## 2023-08-05 DIAGNOSIS — G4733 Obstructive sleep apnea (adult) (pediatric): Secondary | ICD-10-CM | POA: Insufficient documentation

## 2023-08-05 DIAGNOSIS — K760 Fatty (change of) liver, not elsewhere classified: Secondary | ICD-10-CM | POA: Diagnosis not present

## 2023-08-05 DIAGNOSIS — M1990 Unspecified osteoarthritis, unspecified site: Secondary | ICD-10-CM | POA: Insufficient documentation

## 2023-08-05 DIAGNOSIS — N400 Enlarged prostate without lower urinary tract symptoms: Secondary | ICD-10-CM | POA: Insufficient documentation

## 2023-08-05 LAB — COMPREHENSIVE METABOLIC PANEL
ALT: 43 U/L (ref 0–44)
AST: 35 U/L (ref 15–41)
Albumin: 3.9 g/dL (ref 3.5–5.0)
Alkaline Phosphatase: 67 U/L (ref 38–126)
Anion gap: 8 (ref 5–15)
BUN: 15 mg/dL (ref 8–23)
CO2: 22 mmol/L (ref 22–32)
Calcium: 9.1 mg/dL (ref 8.9–10.3)
Chloride: 107 mmol/L (ref 98–111)
Creatinine, Ser: 0.77 mg/dL (ref 0.61–1.24)
GFR, Estimated: >60 mL/min (ref >60)
Glucose, Bld: 91 mg/dL (ref 70–99)
Potassium: 4.2 mmol/L (ref 3.5–5.1)
Sodium: 137 mmol/L (ref 135–145)
Total Bilirubin: 0.6 mg/dL (ref 0.3–1.2)
Total Protein: 7.1 g/dL (ref 6.5–8.1)

## 2023-08-05 LAB — CBC WITH DIFFERENTIAL/PLATELET
Abs Immature Granulocytes: 0.02 10*3/uL (ref 0.00–0.07)
Basophils Absolute: 0 10*3/uL (ref 0.0–0.1)
Basophils Relative: 1 %
Eosinophils Absolute: 0.1 10*3/uL (ref 0.0–0.5)
Eosinophils Relative: 3 %
HCT: 41.1 % (ref 39.0–52.0)
Hemoglobin: 13.2 g/dL (ref 13.0–17.0)
Immature Granulocytes: 0 %
Lymphocytes Relative: 27 %
Lymphs Abs: 1.3 10*3/uL (ref 0.7–4.0)
MCH: 27 pg (ref 26.0–34.0)
MCHC: 32.1 g/dL (ref 30.0–36.0)
MCV: 84.2 fL (ref 80.0–100.0)
Monocytes Absolute: 0.8 10*3/uL (ref 0.1–1.0)
Monocytes Relative: 17 %
Neutro Abs: 2.4 10*3/uL (ref 1.7–7.7)
Neutrophils Relative %: 52 %
Platelets: 145 10*3/uL — ABNORMAL LOW (ref 150–400)
RBC: 4.88 MIL/uL (ref 4.22–5.81)
RDW: 16 % — ABNORMAL HIGH (ref 11.5–15.5)
WBC: 4.6 10*3/uL (ref 4.0–10.5)
nRBC: 0 % (ref 0.0–0.2)

## 2023-08-05 LAB — PROTIME-INR
INR: 1 (ref 0.8–1.2)
Prothrombin Time: 13.9 seconds (ref 11.4–15.2)

## 2023-08-05 MED ORDER — LIDOCAINE HCL (PF) 1 % IJ SOLN
INTRAMUSCULAR | Status: AC | PRN
  Administered 2023-08-05: 10 mL via INTRADERMAL

## 2023-08-05 MED ORDER — FENTANYL CITRATE (PF) 100 MCG/2ML IJ SOLN
INTRAMUSCULAR | Status: AC | PRN
  Administered 2023-08-05: 50 ug via INTRAVENOUS

## 2023-08-05 MED ORDER — MIDAZOLAM HCL 2 MG/2ML IJ SOLN
INTRAMUSCULAR | Status: AC | PRN
  Administered 2023-08-05: 1 mg via INTRAVENOUS

## 2023-08-05 MED ORDER — LIDOCAINE HCL 1 % IJ SOLN
INTRAMUSCULAR | Status: AC
  Filled 2023-08-05: qty 20

## 2023-08-05 MED ORDER — MIDAZOLAM HCL 2 MG/2ML IJ SOLN
INTRAMUSCULAR | Status: AC
  Filled 2023-08-05: qty 2

## 2023-08-05 MED ORDER — GELATIN ABSORBABLE 12-7 MM EX MISC
CUTANEOUS | Status: AC | PRN
  Administered 2023-08-05: 1 via TOPICAL

## 2023-08-05 MED ORDER — FENTANYL CITRATE (PF) 100 MCG/2ML IJ SOLN
INTRAMUSCULAR | Status: AC
  Filled 2023-08-05: qty 2

## 2023-08-05 MED ORDER — GELATIN ABSORBABLE 12-7 MM EX MISC
CUTANEOUS | Status: AC
  Filled 2023-08-05: qty 1

## 2023-08-05 MED ORDER — SODIUM CHLORIDE 0.9 % IV SOLN: INTRAVENOUS | Status: DC

## 2023-08-05 NOTE — Discharge Instructions (Signed)
Liver Biopsy, Care After  May remove dressing or bandaid and shower tomorrow.  Keep site clean and dry. Replace with clean dressing or bandaid as necessary.   Urgent needs - Interventional Radiology clinic [phone]  After a liver biopsy, it is common to have these things in the area where the biopsy was done. You may: Have pain. Feel sore. Have bruising. You may also feel tired for a few days. Follow these instructions at home: Medicines Take over-the-counter and prescription medicines only as told by your doctor. If you were prescribed an antibiotic medicine, take it as told by your doctor. Do not stop taking the antibiotic, even if you start to feel better. Do not take medicines that may thin your blood. These medicines include aspirin and ibuprofen. Take them only if your doctor tells you to. If told, take steps to prevent problems with pooping (constipation). You may need to: Drink enough fluid to keep your pee (urine) pale yellow. Take medicines. You will be told what medicines to take. Eat foods that are high in fiber. These include beans, whole grains, and fresh fruits and vegetables. Limit foods that are high in fat and sugar. These include fried or sweet foods. Ask your doctor if you should avoid driving or using machines while you are taking your medicine. Caring for your incision Follow instructions from your doctor about how to take care of your cut from surgery (incisions). Make sure you: Wash your hands with soap and water for at least 20 seconds before and after you change your bandage. If you cannot use soap and water, use hand sanitizer. Change your bandage. Leavestitches or skin glue in place for at least two weeks. Leave tape strips alone unless you are told to take them off. You may trim the edges of the tape strips if they curl up. Check your incision every day for signs of infection. Check for: Redness, swelling, or more pain. Fluid or blood. Warmth. Pus or a  bad smell. Do not take baths, swim, or use a hot tub. Ask your doctor about taking showers or sponge baths. Activity Rest at home for 1-2 days, or as told by your doctor. Get up to take short walks every 1 to 2 hours. Ask for help if you feel weak or unsteady. Do not lift anything that is heavier than 10 lb (4.5 kg), or the limit that you are told. Do not play contact sports for 2 weeks after the procedure. Return to your normal activities as told by your doctor. Ask what activities are safe for you. General instructions Do not drink alcohol in the first week after the procedure. Plan to have a responsible adult care for you for the time you are told after you leave the hospital or clinic. This is important. It is up to you to get the results of your procedure. Ask how to get your results when they are ready. Keep all follow-up visits.   Contact a doctor if: You have more bleeding in your incision. Your incision swells, or is red and more painful. You have fluid that comes from your incision. You develop a rash. You have fever or chills. Get help right away if: You have swelling, bloating, or pain in your belly (abdomen). You get dizzy or faint. You vomit or you feel like vomiting. You have trouble breathing or feel short of breath. You have chest pain. You have problems talking or seeing. You have trouble with your balance or moving your arms or   legs. These symptoms may be an emergency. Get help right away. Call your local emergency services (911 in the [place] Do not wait to see if the symptoms will go away. Do not drive yourself to the hospital. Summary After the procedure, it is common to have pain, soreness, bruising, and tiredness. Your doctor will tell you how to take care of yourself at home. Change your bandage, take your medicines, and limit your activities as told by your doctor. Call your doctor if you have symptoms of infection. Get help right away if your belly swells,  your cut bleeds a lot, or you have trouble talking or breathing. This information is not intended to replace advice given to you by your health care provider. Make sure you discuss any questions you have with your healthcare provider. Document Revised: 10/29/2020 Document Reviewed: 10/29/2020 Elsevier Patient Education  2022 Elsevier Inc.  Moderate Conscious Sedation, Adult, Care After This sheet gives you information about how to care for yourself after your procedure. Your health care provider may also give you more specific instructions. If you have problems or questions, contact your health careprovider. What can I expect after the procedure? After the procedure, it is common to have: Sleepiness for several hours. Impaired judgment for several hours. Difficulty with balance. Vomiting if you eat too soon. Follow these instructions at home: For the time period you were told by your health care provider: Rest. Do not participate in activities where you could fall or become injured. Do not drive or use machinery. Do not drink alcohol. Do not take sleeping pills or medicines that cause drowsiness. Do not make important decisions or sign legal documents. Do not take care of children on your own. Eating and drinking  Follow the diet recommended by your health care provider. Drink enough fluid to keep your urine pale yellow. If you vomit: Drink water, juice, or soup when you can drink without vomiting. Make sure you have little or no nausea before eating solid foods.  General instructions Take over-the-counter and prescription medicines only as told by your health care provider. Have a responsible adult stay with you for the time you are told. It is important to have someone help care for you until you are awake and alert. Do not smoke. Keep all follow-up visits as told by your health care provider. This is important. Contact a health care provider if: You are still sleepy or having  trouble with balance after 24 hours. You feel light-headed. You keep feeling nauseous or you keep vomiting. You develop a rash. You have a fever. You have redness or swelling around the IV site. Get help right away if: You have trouble breathing. You have new-onset confusion at home. Summary After the procedure, it is common to feel sleepy, have impaired judgment, or feel nauseous if you eat too soon. Rest after you get home. Know the things you should not do after the procedure. Follow the diet recommended by your health care provider and drink enough fluid to keep your urine pale yellow. Get help right away if you have trouble breathing or new-onset confusion at home. This information is not intended to replace advice given to you by your health care provider. Make sure you discuss any questions you have with your healthcare provider. Document Revised: 04/13/2020 Document Reviewed: 11/10/2019 Elsevier Patient Education  2022 Elsevier Inc.        

## 2023-08-05 NOTE — Procedures (Signed)
Vascular and Interventional Radiology Procedure Note  Patient: [name] DOB: 07/16/1951 Medical Record Number: [id number] Note Date/Time: 08/05/23 12:57 PM   Performing Physician: Roanna Banning, MD Assistant(s): None  Diagnosis: > LFTs   Procedure: LIVER BIOPSY, NON-TARGETED  Anesthesia: Conscious Sedation Complications: None Estimated Blood Loss: Minimal Specimens: Sent for Pathology  Findings:  Successful Ultrasound-guided biopsy of liver. A total of 3 samples were obtained. Hemostasis of the tract was achieved using Gelfoam Slurry Embolization.  Plan: Bed rest for 2 hours.  See detailed procedure note with images in PACS. The patient tolerated the procedure well without incident or complication and was returned to Recovery in stable condition.    Roanna Banning, MD [facility] Radiology   Pager. [phone] Clinic. [phone]

## 2023-08-06 ENCOUNTER — Other Ambulatory Visit (INDEPENDENT_AMBULATORY_CARE_PROVIDER_SITE_OTHER): Payer: Self-pay | Admitting: Adult Health

## 2023-08-06 ENCOUNTER — Ambulatory Visit (INDEPENDENT_AMBULATORY_CARE_PROVIDER_SITE_OTHER): Payer: Medicare Other | Admitting: Adult Health

## 2023-08-06 ENCOUNTER — Telehealth: Payer: Self-pay

## 2023-08-06 ENCOUNTER — Encounter (INDEPENDENT_AMBULATORY_CARE_PROVIDER_SITE_OTHER): Payer: Self-pay | Admitting: Adult Health

## 2023-08-06 DIAGNOSIS — E669 Obesity, unspecified: Secondary | ICD-10-CM | POA: Diagnosis not present

## 2023-08-06 DIAGNOSIS — K760 Fatty (change of) liver, not elsewhere classified: Secondary | ICD-10-CM

## 2023-08-06 DIAGNOSIS — Z6834 Body mass index (BMI) 34.0-34.9, adult: Secondary | ICD-10-CM | POA: Diagnosis not present

## 2023-08-06 DIAGNOSIS — R7303 Prediabetes: Secondary | ICD-10-CM

## 2023-08-06 MED ORDER — WEGOVY 0.25 MG/0.5ML ~~LOC~~ SOAJ: 0.2500 mg | SUBCUTANEOUS | 0 refills | Status: DC

## 2023-08-06 NOTE — Telephone Encounter (Signed)
PA submited

## 2023-08-06 NOTE — Telephone Encounter (Signed)
Per Cover My Meds: [name] has approved a request from you or your doctor for [facility] Soln Auto-inj  0.25MG /0.5ML. This approval is for 07/23/2023 until further notice.

## 2023-08-06 NOTE — Progress Notes (Signed)
WEIGHT SUMMARY AND BIOMETRICS  Vitals Temp: 98.2 F (36.8 C) BP: 113/73 Pulse Rate: (!) 57 SpO2: 97 %   Anthropometric Measurements Height: 5\' 11"  (1.803 m) Weight: 249 lb (112.9 kg) BMI (Calculated): 34.74 Weight at Last Visit: 249lbs Weight Lost Since Last Visit: 0 Weight Gained Since Last Visit: 0 Starting Weight: 266lbs Total Weight Loss (lbs): 17 lb (7.711 kg)   Body Composition  Body Fat %: 35.7 % Fat Mass (lbs): 89.2 lbs Muscle Mass (lbs): 152.4 lbs Total Body Water (lbs): 110.6 lbs Visceral Fat Rating : 22   Other Clinical Data Fasting: no Labs: no Today's Visit #: 21 Starting Date: 01/01/21    Chief Complaint:   OBESITY [name] is here to discuss his progress with his obesity treatment plan. He is on the the Category 3 Plan and states he is following his eating plan approximately 60 % of the time. He states he is exercising Swimming, Strength Training 45-60 minutes 5 times per week.   Interim History:  Since last OV at [facility] on 06/23/2023 -He has seen Urologist- treated for UTI with Bactrim, experienced reaction- Treated with prednisone 50 mg daily x 5 days Magic mouthwash Triamcinolone paste  - Referred to Gastroenterology, re: elevated liver enzymes  - OV with Cards, re:CAD  Subjective:   1. NAFLD (nonalcoholic fatty liver disease) 2/95/2841 [place] ABD LIMITED RUQ IMPRESSION: 1. Increased hepatic parenchymal echogenicity suggestive of steatosis. 2. No cholelithiasis or sonographic evidence for acute cholecystitis.  07/21/2023 Laurette Schimke OV Notes: HPI:    Mr. [name] is a 72 year old male with a past medical history as listed below including reflux, A-fib on Plavix, CHF (12/31/2018 echo with LVEF 65-70%), osteoarthritis and multiple others, known to Dr. Rhea Belton, who was referred to me by Pincus Sanes, MD for a complaint of elevated LFTs.      04/30/2015 colonoscopy for surveillance due to prior colonic neoplasia was normal.  Repeat recommended in  10 years.    06/17/2023 CMP with AST 117, ALT 155 (02/24/2023 AST 42 and ALT 47, 10/23/2022 AST 51 and ALT 61, 06/09/2022 normal).    07/03/2023 AST 63 and ALT 104.  ANA positive, ASMA elevated at 41.  Hepatitis testing negative.    07/08/2023 right upper quadrant ultrasound with increased hepatic parenchymal echogenicity suggestive of steatosis and otherwise normal.    Today, patient presents to clinic accompanied by his wife who assists with his history.  He explains to me that he really feels completely fine but was told that he had elevated liver enzymes.  Apparently these were first noticed when he went to a weight loss clinic a couple of years ago and then after he lost about 20 pounds they normalized and recently have started to go back up per him.  He has no family history of liver disease or prior history of hepatitis or tattoos.  They have had testing as above and have some questions.    Denies fever, chills or weight loss. Assessment: 1.  Elevated LFTs: Elevated consistently since October of last year, recent ultrasound with fatty liver but ASMA and ANA positive; concern for autoimmune hepatitis versus fatty liver versus other 2.  Fatty liver   Plan: 1. Ordered labs to include IGG, Ferritin and AMA 2.  Scheduled patient for ultrasound-guided liver biopsy for further evaluation of possible autoimmune hepatitis versus fatty liver.  Patient was advised that he will need to hold his Plavix for 5 days prior to time of this procedure and hold it  for 48 hours after.  We will communicate with his prescribing physician to ensure this is acceptable for him. 3.  Answered all the patient's questions. 4.  Patient to follow in clinic per recommendations after imaging/labs above.  2. Pre-diabetes Lab Results  Component Value Date   HGBA1C 6.2 06/17/2023   HGBA1C 5.9 (H) 02/24/2023   HGBA1C 5.7 (H) 10/23/2022     Latest Reference Range & Units 08/05/23 12:01  GFR, Estimated >60 mL/min >60   He is on  daily Metformin 500mg  2 tabs- taking both tabs at breakfast  He denies hx of MENS 2 or MTC. He denies personal hx of pancreatitis. Discussed/benefits of Wegovy therapy- previously it was cost prohibitive. New FDA heart disease indication with [facility]- may be able to achieve GLP-1 therapy. Dicussed how to complete an appeal if Wegovy denied- pt verbalized understanding/agreement.   Assessment/Plan:   1. NAFLD (nonalcoholic fatty liver disease) F/u with GI Continue with weight loss efforts and avoid Nephrotoxic substances  2. Pre-diabetes Continue Metformin therapy and start GLP-1 therapy  3. Obesity, Current BMI 34.75 Semaglutide-Weight Management (WEGOVY) 0.25 MG/0.5ML SOAJ Inject 0.25 mg into the skin once a week. Dispense: 2 mL, Refills: 0 ordered   [name] is currently in the action stage of change. As such, his goal is to continue with weight loss efforts. He has agreed to the Category 3 Plan.   Exercise goals: Older adults should follow the adult guidelines. When older adults cannot meet the adult guidelines, they should be as physically active as their abilities and conditions will allow.  Older adults should do exercises that maintain or improve balance if they are at risk of falling.  Older adults should determine their level of effort for physical activity relative to their level of fitness.   Behavioral modification strategies: increasing lean protein intake, decreasing simple carbohydrates, increasing vegetables, increasing water intake, no skipping meals, meal planning and cooking strategies, and planning for success.  [name] has agreed to follow-up with our clinic in 3 weeks. He was informed of the importance of frequent follow-up visits to maximize his success with intensive lifestyle modifications for his multiple health conditions.   Objective:   Blood pressure 113/73, pulse (!) 57, temperature 98.2 F (36.8 C), height 5\' 11"  (1.803 m), weight 249 lb (112.9 kg), SpO2  97%. Body mass index is 34.73 kg/m.  General: Cooperative, alert, well developed, in no acute distress. HEENT: Conjunctivae and lids unremarkable. Cardiovascular: Regular rhythm.  Lungs: Normal work of breathing. Neurologic: No focal deficits.   Lab Results  Component Value Date   CREATININE 0.77 08/05/2023   BUN 15 08/05/2023   NA 137 08/05/2023   K 4.2 08/05/2023   CL 107 08/05/2023   CO2 22 08/05/2023   Lab Results  Component Value Date   ALT 43 08/05/2023   AST 35 08/05/2023   ALKPHOS 67 08/05/2023   BILITOT 0.6 08/05/2023   Lab Results  Component Value Date   HGBA1C 6.2 06/17/2023   HGBA1C 5.9 (H) 02/24/2023   HGBA1C 5.7 (H) 10/23/2022   HGBA1C 5.8 (H) 06/09/2022   HGBA1C 5.5 01/09/2022   Lab Results  Component Value Date   INSULIN 15.6 02/24/2023   INSULIN 19.4 10/23/2022   INSULIN 15.2 06/09/2022   INSULIN 8.4 01/09/2022   INSULIN 22.7 08/19/2021   Lab Results  Component Value Date   TSH 0.65 06/17/2023   Lab Results  Component Value Date   CHOL 123 06/17/2023   HDL 42.90 06/17/2023  LDLCALC 65 06/17/2023   LDLDIRECT 153.1 06/09/2011   TRIG 75.0 06/17/2023   CHOLHDL 3 06/17/2023   Lab Results  Component Value Date   VD25OH 46.8 02/24/2023   VD25OH 60.2 10/23/2022   VD25OH 80.1 06/09/2022   Lab Results  Component Value Date   WBC 4.6 08/05/2023   HGB 13.2 08/05/2023   HCT 41.1 08/05/2023   MCV 84.2 08/05/2023   PLT 145 (L) 08/05/2023   Lab Results  Component Value Date   FERRITIN 20.6 (L) 07/21/2023    Attestation Statements:   Reviewed by clinician on day of visit: allergies, medications, problem list, medical history, surgical history, family history, social history, and previous encounter notes.  I have reviewed the above documentation for accuracy and completeness, and I agree with the above. -   d. , NP-C

## 2023-08-06 NOTE — Telephone Encounter (Signed)
PA submitted through Cover My Meds for [facility]. Awaiting insurance determination. Key: [id number]

## 2023-08-11 DIAGNOSIS — K760 Fatty (change of) liver, not elsewhere classified: Secondary | ICD-10-CM

## 2023-08-11 DIAGNOSIS — Z7952 Long term (current) use of systemic steroids: Secondary | ICD-10-CM

## 2023-08-11 DIAGNOSIS — Z6834 Body mass index (BMI) 34.0-34.9, adult: Secondary | ICD-10-CM

## 2023-08-11 DIAGNOSIS — K754 Autoimmune hepatitis: Secondary | ICD-10-CM

## 2023-08-11 DIAGNOSIS — R7989 Other specified abnormal findings of blood chemistry: Secondary | ICD-10-CM

## 2023-08-11 MED ORDER — BUDESONIDE 3 MG PO CPEP
9.0000 mg | ORAL_CAPSULE | Freq: Every day | ORAL | 2 refills | Status: DC
Start: 2023-08-11 — End: 2023-11-20
  Filled 2023-08-12: qty 90, 30d supply, fill #0
  Filled 2023-09-07: qty 90, 30d supply, fill #1
  Filled 2023-10-26: qty 90, 30d supply, fill #2

## 2023-08-11 NOTE — Telephone Encounter (Signed)
Dr. Rhea Belton, please review and advise in [name]'s absence please. Thanks

## 2023-08-11 NOTE — Telephone Encounter (Signed)
Referral, records, patient's demographic, and insurance information have been faxed to Annamarie Major, NP at [facility] (P: [phone], F: [phone])  Lab orders in epic. Bone density test is scheduled for Friday, 08/14/23 at 10 am.   Budesonide RX sent to [facility] pharmacy on file.

## 2023-08-11 NOTE — Telephone Encounter (Signed)
[place], this biopsy is consistent with autoimmune hepatitis This is in the setting of a very positive ANA and positive antismooth muscle antibody Persistently elevated liver enzymes  Please refer him to see [facility] here locally, attention Annamarie Major, NP  In the meantime I would like to perform several additional blood test but also began treatment Please have the patient come for TPMT, vitamin D level, and he needs a bone mineral density test given that we will start steroids  Please begin Entocort/budesonide 9 mg daily After TPMT is obtained we will add azathioprine to his treatment regimen as we wean off steroids  We need to repeat hepatic function panel in 4 weeks after starting steroids  Please also let him know that we will ensure that the hepatology clinic agrees with my treatment recommendations and if so he can return to [place] for management  Thanks JMP

## 2023-08-12 ENCOUNTER — Telehealth: Payer: Self-pay | Admitting: Pharmacy Technician

## 2023-08-12 ENCOUNTER — Other Ambulatory Visit (HOSPITAL_COMMUNITY): Payer: Self-pay

## 2023-08-12 NOTE — Telephone Encounter (Signed)
Pharmacy Patient Advocate Encounter   Received notification from CoverMyMeds that prior authorization for BUDESONIDE 3MG  is required/requested.   Insurance verification completed.   The patient is insured through [facility] .   Per test claim: PA required; PA submitted to [facility] via CoverMyMeds Key/confirmation #/EOC [id number] Status is pending

## 2023-08-12 NOTE — Telephone Encounter (Signed)
Pharmacy Patient Advocate Encounter  Received notification from [facility] that Prior Authorization for BUDESONIDE 3MG  has been APPROVED from 8.14.24 to 12.31.2099. Ran test claim, Copay is $45.85 for 30 day supply. This test claim was processed through [facility]- copay amounts may vary at other pharmacies due to pharmacy/plan contracts, or as the patient moves through the different stages of their insurance plan.   PA #/Case ID/Reference #: [id number]

## 2023-08-14 ENCOUNTER — Other Ambulatory Visit (INDEPENDENT_AMBULATORY_CARE_PROVIDER_SITE_OTHER): Payer: Medicare Other

## 2023-08-14 ENCOUNTER — Ambulatory Visit
Admission: RE | Admit: 2023-08-14 | Discharge: 2023-08-14 | Disposition: A | Discharge status: Home / Self Care | Payer: Medicare Other | Source: Ambulatory Visit | Attending: Internal Medicine | Admitting: Internal Medicine

## 2023-08-14 DIAGNOSIS — K760 Fatty (change of) liver, not elsewhere classified: Secondary | ICD-10-CM

## 2023-08-14 DIAGNOSIS — R7989 Other specified abnormal findings of blood chemistry: Secondary | ICD-10-CM

## 2023-08-14 DIAGNOSIS — K754 Autoimmune hepatitis: Secondary | ICD-10-CM

## 2023-08-14 DIAGNOSIS — Z7952 Long term (current) use of systemic steroids: Secondary | ICD-10-CM

## 2023-08-14 DIAGNOSIS — Z6834 Body mass index (BMI) 34.0-34.9, adult: Secondary | ICD-10-CM

## 2023-08-14 LAB — VITAMIN D 25 HYDROXY (VIT D DEFICIENCY, FRACTURES): VITD: 61.69 ng/mL (ref 30.00–100.00)

## 2023-08-21 LAB — THIOPURINE METHYLTRANSFERASE (TPMT), RBC: Thiopurine Methyltransferase, RBC: 16 nmol/h/mL

## 2023-08-24 ENCOUNTER — Other Ambulatory Visit: Payer: Self-pay

## 2023-08-24 DIAGNOSIS — K754 Autoimmune hepatitis: Secondary | ICD-10-CM

## 2023-08-24 MED ORDER — AZATHIOPRINE 50 MG PO TABS: 50.0000 mg | ORAL_TABLET | Freq: Every day | ORAL | 6 refills | Status: DC

## 2023-08-25 ENCOUNTER — Other Ambulatory Visit: Payer: Self-pay | Admitting: Internal Medicine

## 2023-08-25 ENCOUNTER — Encounter: Payer: Self-pay | Admitting: Internal Medicine

## 2023-08-26 ENCOUNTER — Other Ambulatory Visit: Payer: Self-pay

## 2023-08-26 MED ORDER — LEVOTHYROXINE SODIUM 137 MCG PO TABS: 137.0000 ug | ORAL_TABLET | Freq: Every day | ORAL | 3 refills | Status: DC

## 2023-09-02 ENCOUNTER — Encounter (INDEPENDENT_AMBULATORY_CARE_PROVIDER_SITE_OTHER): Payer: Self-pay | Admitting: Adult Health

## 2023-09-02 ENCOUNTER — Ambulatory Visit (INDEPENDENT_AMBULATORY_CARE_PROVIDER_SITE_OTHER): Payer: Medicare Other | Admitting: Adult Health

## 2023-09-02 VITALS — BP 125/71 | HR 53 | Temp 97.9°F | Ht 71.0 in | Wt 245.0 lb

## 2023-09-02 DIAGNOSIS — E669 Obesity, unspecified: Secondary | ICD-10-CM | POA: Diagnosis not present

## 2023-09-02 DIAGNOSIS — Z6834 Body mass index (BMI) 34.0-34.9, adult: Secondary | ICD-10-CM | POA: Diagnosis not present

## 2023-09-02 DIAGNOSIS — R7303 Prediabetes: Secondary | ICD-10-CM | POA: Diagnosis not present

## 2023-09-02 DIAGNOSIS — K754 Autoimmune hepatitis: Secondary | ICD-10-CM | POA: Diagnosis not present

## 2023-09-02 NOTE — Progress Notes (Signed)
WEIGHT SUMMARY AND BIOMETRICS  Vitals Temp: 97.9 F (36.6 C) BP: 125/71 Pulse Rate: (!) 53 SpO2: 96 %   Anthropometric Measurements Height: 5\' 11"  (1.803 m) Weight: 245 lb (111.1 kg) BMI (Calculated): 34.19 Weight at Last Visit: 249 lb Weight Lost Since Last Visit: 4 lb Weight Gained Since Last Visit: 0 lb Starting Weight: 266 lb Total Weight Loss (lbs): 21 lb (9.526 kg)   Body Composition  Body Fat %: 35.7 % Fat Mass (lbs): 87.8 lbs Muscle Mass (lbs): 150.2 lbs Total Body Water (lbs): 111.2 lbs Visceral Fat Rating : 22   Other Clinical Data Fasting: no Labs: no Today's Visit #: 22 Starting Date: 01/01/21    Chief Complaint:   OBESITY [name] is here to discuss his progress with his obesity treatment plan. He is on the the Category 3 Plan and states he is following his eating plan approximately 60 % of the time. 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   Interim History:  Since last OV at [facility] on 08/06/2023 1) PA [facility] APPROVED- awaiting fill from pharmacy 2) Liver Bx- This biopsy is consistent with autoimmune hepatitis This is in the setting of a very positive ANA, positive antismooth muscle antibody, persistently elevated liver enzymes  Subjective:   1. Pre-diabetes Lab Results  Component Value Date   HGBA1C 6.2 06/17/2023   HGBA1C 5.9 (H) 02/24/2023   HGBA1C 5.7 (H) 10/23/2022   He is currently on Metformin 500mg  2 tabs at breakfast. PA for [facility] 0.25mg  was recently approved- he has yet to receive loading dose GLP-1 due to [employer].  2. Autoimmune hepatitis (HCC) He had persistent elevated liver enzymes. PCP sent him for GI work-up to include a liver bx. Results: biopsy is consistent with autoimmune hepatitis This is in the setting of a very positive ANA and positive antismooth muscle antibody Persistently elevated liver enzymes   Please refer him to see [facility]  here locally, attention Annamarie Major, NP   In the meantime I would like to perform several additional blood test but also began treatment Please have the patient come for TPMT, vitamin D level, and he needs a bone mineral density test given that we will start steroids   Please begin Entocort/budesonide 9 mg daily After TPMT is obtained we will add azathioprine to his treatment regimen as we wean off steroids  Repeat CMP on 08/05/2023  Latest Reference Range & Units 08/05/23 12:01  AST 15 - 41 U/L 35  ALT 0 - 44 U/L 43  Total Protein 6.5 - 8.1 g/dL 7.1  Total Bilirubin 0.3 - 1.2 mg/dL 0.6  GFR, Estimated >95 mL/min >60    Assessment/Plan:   1. Pre-diabetes Continue Metformin therapy as directed. Call pharmacy weekly and inquire about [facility] supply. Once GLP-1 injection available- start once weekly 0.25mg   2. Autoimmune hepatitis (HCC) Avoid hepatotoxic substances F/u with GI and PCP as directed  3. Obesity, Current BMI 34.3  [name] is currently in the action stage of change. As such, his goal is to continue with weight loss efforts. He has agreed to the Category 3 Plan.   Exercise goals: Older adults should follow the adult guidelines. When older adults cannot meet the adult guidelines, they should be as physically active as their abilities and conditions will allow.  Older adults should do exercises that maintain or improve balance if they are at risk of falling.  Older adults should determine  their level of effort for physical activity relative to their level of fitness.  Older adults with chronic conditions should understand whether and how their conditions affect their ability to do regular physical activity safely.  Behavioral modification strategies: increasing lean protein intake, decreasing simple carbohydrates, increasing vegetables, increasing water intake, no skipping meals, meal planning and cooking strategies, better snacking choices, and planning for success.  [name]  has agreed to follow-up with our clinic in 4 weeks. He was informed of the importance of frequent follow-up visits to maximize his success with intensive lifestyle modifications for his multiple health conditions.   Objective:   Blood pressure 125/71, pulse (!) 53, temperature 97.9 F (36.6 C), height 5\' 11"  (1.803 m), weight 245 lb (111.1 kg), SpO2 96%. Body mass index is 34.17 kg/m.  General: Cooperative, alert, well developed, in no acute distress. HEENT: Conjunctivae and lids unremarkable. Cardiovascular: Regular rhythm.  Lungs: Normal work of breathing. Neurologic: No focal deficits.   Lab Results  Component Value Date   CREATININE 0.77 08/05/2023   BUN 15 08/05/2023   NA 137 08/05/2023   K 4.2 08/05/2023   CL 107 08/05/2023   CO2 22 08/05/2023   Lab Results  Component Value Date   ALT 43 08/05/2023   AST 35 08/05/2023   ALKPHOS 67 08/05/2023   BILITOT 0.6 08/05/2023   Lab Results  Component Value Date   HGBA1C 6.2 06/17/2023   HGBA1C 5.9 (H) 02/24/2023   HGBA1C 5.7 (H) 10/23/2022   HGBA1C 5.8 (H) 06/09/2022   HGBA1C 5.5 01/09/2022   Lab Results  Component Value Date   INSULIN 15.6 02/24/2023   INSULIN 19.4 10/23/2022   INSULIN 15.2 06/09/2022   INSULIN 8.4 01/09/2022   INSULIN 22.7 08/19/2021   Lab Results  Component Value Date   TSH 0.65 06/17/2023   Lab Results  Component Value Date   CHOL 123 06/17/2023   HDL 42.90 06/17/2023   LDLCALC 65 06/17/2023   LDLDIRECT 153.1 06/09/2011   TRIG 75.0 06/17/2023   CHOLHDL 3 06/17/2023   Lab Results  Component Value Date   VD25OH 61.69 08/14/2023   VD25OH 46.8 02/24/2023   VD25OH 60.2 10/23/2022   Lab Results  Component Value Date   WBC 4.6 08/05/2023   HGB 13.2 08/05/2023   HCT 41.1 08/05/2023   MCV 84.2 08/05/2023   PLT 145 (L) 08/05/2023   Lab Results  Component Value Date   FERRITIN 20.6 (L) 07/21/2023   Attestation Statements:   Reviewed by clinician on day of visit: allergies,  medications, problem list, medical history, surgical history, family history, social history, and previous encounter notes.  Time spent on visit including pre-visit chart review and post-visit care and charting was 28 minutes.   I have reviewed the above documentation for accuracy and completeness, and I agree with the above. -  Alexiana Laverdure d. Jonika Critz, NP-C

## 2023-09-03 ENCOUNTER — Ambulatory Visit: Payer: Medicare Other | Attending: Cardiology | Admitting: Cardiology

## 2023-09-03 ENCOUNTER — Encounter: Payer: Self-pay | Admitting: Cardiology

## 2023-09-03 VITALS — BP 142/88 | HR 54 | Ht 71.0 in | Wt 250.2 lb

## 2023-09-03 DIAGNOSIS — E7800 Pure hypercholesterolemia, unspecified: Secondary | ICD-10-CM | POA: Diagnosis not present

## 2023-09-03 DIAGNOSIS — I10 Essential (primary) hypertension: Secondary | ICD-10-CM | POA: Diagnosis not present

## 2023-09-03 DIAGNOSIS — G4733 Obstructive sleep apnea (adult) (pediatric): Secondary | ICD-10-CM | POA: Diagnosis not present

## 2023-09-03 DIAGNOSIS — I2510 Atherosclerotic heart disease of native coronary artery without angina pectoris: Secondary | ICD-10-CM | POA: Insufficient documentation

## 2023-09-03 MED ORDER — NITROGLYCERIN 0.4 MG SL SUBL: 0.4000 mg | SUBLINGUAL_TABLET | SUBLINGUAL | 3 refills | Status: AC | PRN

## 2023-09-03 NOTE — Patient Instructions (Signed)
Medication Instructions:  Your physician recommends that you continue on your current medications as directed. Please refer to the Current Medication list given to you today.  *If you need a refill on your cardiac medications before your next appointment, please call your pharmacy*   Lab Work: None.  If you have labs (blood work) drawn today and your tests are completely normal, you will receive your results only by: MyChart Message (if you have MyChart) OR A paper copy in the mail If you have any lab test that is abnormal or we need to change your treatment, we will call you to review the results.   Testing/Procedures: None.   Follow-Up: At [facility], you and your health needs are our priority.  As part of our continuing mission to provide you with exceptional heart care, we have created designated Provider Care Teams.  These Care Teams include your primary Cardiologist (physician) and Advanced Practice Providers (APPs -  Physician Assistants and Nurse Practitioners) who all work together to provide you with the care you need, when you need it.  We recommend signing up for the patient portal called "MyChart".  Sign up information is provided on this After Visit Summary.  MyChart is used to connect with patients for Virtual Visits (Telemedicine).  Patients are able to view lab/test results, encounter notes, upcoming appointments, etc.  Non-urgent messages can be sent to your provider as well.   To learn more about what you can do with MyChart, go to [email or web address].    Your next appointment:   1 year(s)  Provider:   Traci Turner, MD     

## 2023-09-03 NOTE — Progress Notes (Signed)
Cardiology Office Note:    Date:  09/03/2023   ID:  [name], DOB 1951-01-13, MRN [id number]  PCP:  Pincus Sanes, MD  Cardiologist:  Armanda Magic, MD    Referring MD: Pincus Sanes, MD   Chief Complaint  Patient presents with   Coronary Artery Disease   Hypertension   Hyperlipidemia   Sleep Apnea    History of Present Illness:    [name] is a 72 y.o. male with a hx of ASCAD s/p inferior STEMI with cath showing 95% PDA that was felt to be the culprit vessel. This was treated with PCI-DES. Also noted was CTO CFX with collaterals from the PDA on OM1. There was an unsuccessful attempt at opening the CFX. Also has 70% mid LAD lesion, to be treated medically. EF was 60% with inferobasilar HK. Echo showed EF 65% with no WMA, grade 1 DD.  He also has a hx of OSA on CPAP, obesity, HTN and PAF on anticoagulation.  He is meds and is tolerating meds with no SE.    Since I saw him last he had prostate cancer surgery. He was seen by his PCP recently and had elevated LFTs.  He was seen by GI for possible autoimmune hepatitis.  He had a liver bx done and was most consistent with autoimmune hepatitis.  He was started on steroids and now is on Imuran and Entocort.    He is here today for followup and is doing well.  He denies any chest pain or pressure, SOB, DOE, PND, orthopnea, LE edema, dizziness, palpitations or syncope. He is compliant with his meds and is tolerating meds with no SE.    He is doing well with his PAP device and thinks that he has gotten used to it.  He tolerates the mask and feels the pressure is adequate.  Since going on PAP he feels rested in the am and has no significant daytime sleepiness.  He denies any significant mouth or nasal dryness or nasal congestion.  He does not think that he snores.    Past Medical History:  Diagnosis Date   Back pain    BPH (benign prostatic hypertrophy)    Chest pain    COLONIC POLYPS, HX OF 2007   clear colo w/o polyps 04/2015: 61yr  follow up   Constipation    Coronary artery disease    Decreased mobility    due to BL knee and hip replacement   Fatigue    GERD (gastroesophageal reflux disease)    Heartburn    History of kidney stones    passed   Hyperlipidemia    HYPERTENSION    HYPOTHYROIDISM    Joint pain    Myocardial infarction (HCC) 12/2018   Inferior STEMI   OA (osteoarthritis)    Knees   OBESITY    OSA treated with BiPAP    PAF (paroxysmal atrial fibrillation) (HCC)    Pneumonia    Pre-diabetes    Shortness of breath on exertion    Umbilical hernia    Ventral hernia     Past Surgical History:  Procedure Laterality Date   arthroscopic knee surgery     (R) 2003 & (L) 2010   CARDIAC CATHETERIZATION     COLONOSCOPY  04/2015   no polyps ([place])   CORONARY/GRAFT ACUTE MI REVASCULARIZATION N/A 12/30/2018   Procedure: Coronary/Graft Acute MI Revascularization;  Surgeon: Lyn Records, MD;  Location: [facility];  Service: Cardiovascular;  Laterality: N/A;   HIP CLOSED REDUCTION Right 03/12/2021   Procedure: CLOSED REDUCTION HIP;  Surgeon: Venita Lick, MD;  Location: [facility];  Service: Orthopedics;  Laterality: Right;   LAMINECTOMY  1991   LEFT HEART CATH AND CORONARY ANGIOGRAPHY N/A 12/30/2018   Procedure: LEFT HEART CATH AND CORONARY ANGIOGRAPHY;  Surgeon: Lyn Records, MD;  Location: [facility];  Service: Cardiovascular;  Laterality: N/A;   TONSILLECTOMY  1990   w/ adenoids and uvula   TONSILLECTOMY     TOTAL HIP ARTHROPLASTY Right 01/2011   [place]   TOTAL HIP ARTHROPLASTY Left 04/16/2016   Procedure: TOTAL HIP ARTHROPLASTY ANTERIOR APPROACH;  Surgeon: Ollen Gross, MD;  Location: [facility];  Service: Orthopedics;  Laterality: Left;   TOTAL HIP REVISION Right 01/04/2020   Procedure: Right hip bearing surface;  Surgeon: Ollen Gross, MD;  Location: [facility];  Service: Orthopedics;  Laterality: Right;    TOTAL HIP REVISION Right 04/15/2021   Procedure: Right hip  bearing surface revision;  Surgeon: Ollen Gross, MD;  Location: [facility];  Service: Orthopedics;  Laterality: Right;    TOTAL KNEE ARTHROPLASTY  12/27/2012   Procedure: TOTAL KNEE BILATERAL;  Surgeon: Loanne Drilling, MD;  Location: [facility];  Service: Orthopedics;  Laterality: Bilateral;   UMBILICAL HERNIA REPAIR N/A 03/11/2023   Procedure: HERNIA REPAIR UMBILICAL ADULT;  Surgeon: Sebastian Ache, MD;  Location: [facility];  Service: Urology;  Laterality: N/A;   WISDOM TOOTH EXTRACTION     XI ROBOTIC ASSISTED SIMPLE PROSTATECTOMY N/A 03/11/2023   Procedure: XI ROBOTIC ASSISTED SIMPLE PROSTATECTOMY;  Surgeon: Sebastian Ache, MD;  Location: [facility];  Service: Urology;  Laterality: N/A;  3 HRS    Current Medications: Current Meds  Medication Sig   aspirin 81 MG chewable tablet Chew 1 tablet (81 mg total) by mouth daily.   atorvastatin (LIPITOR) 80 MG tablet TAKE 1 TABLET BY MOUTH DAILY AT 6 PM.   azaTHIOprine (IMURAN) 50 MG tablet Take 1 tablet (50 mg total) by mouth daily.   budesonide (ENTOCORT EC) 3 MG 24 hr capsule Take 3 capsules (9 mg total) by mouth daily.   clopidogrel (PLAVIX) 75 MG tablet Take 1 tablet (75 mg total) by mouth daily.   docusate sodium (COLACE) 100 MG capsule Take 100 mg by mouth daily.   finasteride (PROSCAR) 5 MG tablet Take 5 mg by mouth daily.   levothyroxine (SYNTHROID) 137 MCG tablet Take 1 tablet (137 mcg total) by mouth daily before breakfast.   metFORMIN (GLUCOPHAGE) 500 MG tablet Take 2 tablets (1,000 mg total) by mouth daily with breakfast.   metoprolol succinate (TOPROL-XL) 25 MG 24 hr tablet TAKE 1 TABLET (25 MG TOTAL) BY MOUTH DAILY.   telmisartan (MICARDIS) 80 MG tablet TAKE 1 TABLET BY MOUTH EVERY DAY   [DISCONTINUED] nitroGLYCERIN (NITROSTAT) 0.4 MG SL tablet PLACE 1 TABLET (0.4 MG TOTAL) UNDER THE TONGUE EVERY 5 (FIVE) MINUTES AS NEEDED FOR CHEST PAIN.   [DISCONTINUED] Semaglutide-Weight Management (WEGOVY) 0.25 MG/0.5ML SOAJ Inject 0.25 mg into the  skin once a week.     Allergies:   Bactrim [sulfamethoxazole-trimethoprim]   Social History   Socioeconomic History   Marital status: Married    Spouse name: Prajwal Zurick   Number of children: Not on file   Years of education: Not on file   Highest education level: Some college, no degree  Occupational History   Occupation: Retired  Tobacco Use   Smoking status: Never   Smokeless tobacco:  Never  Vaping Use   Vaping status: Never Used  Substance and Sexual Activity   Alcohol use: Yes    Comment: rarely, social   Drug use: No   Sexual activity: Not on file  Other Topics Concern   Not on file  Social History Narrative   Working part time, contemplating retirement end of 2016   Lives at home with spouse      Exercise: water exercises   Social Determinants of Health   Financial Resource Strain: Low Risk  (02/17/2022)   Overall Financial Resource Strain (CARDIA)    Difficulty of Paying Living Expenses: Not hard at all  Food Insecurity: No Food Insecurity (06/13/2023)   Hunger Vital Sign    Worried About Running Out of Food in the Last Year: Never true    Ran Out of Food in the Last Year: Never true  Transportation Needs: No Transportation Needs (06/13/2023)   PRAPARE - Administrator, Civil Service (Medical): No    Lack of Transportation (Non-Medical): No  Physical Activity: Sufficiently Active (06/13/2023)   Exercise Vital Sign    Days of Exercise per Week: 5 days    Minutes of Exercise per Session: 60 min  Stress: No Stress Concern Present (06/13/2023)   [employer] of Occupational Health - Occupational Stress Questionnaire    Feeling of Stress : Only a little  Social Connections: Socially Integrated (06/13/2023)   Social Connection and Isolation Panel [NHANES]    Frequency of Communication with Friends and Family: Twice a week    Frequency of Social Gatherings with Friends and Family: Once a week    Attends Religious Services: More than 4 times per year     Active Member of [employer] or Organizations: Yes    Attends Engineer, structural: Not on file    Marital Status: Married     Family History: The patient's family history includes ALS in his maternal grandfather; Arthritis in an other family member; Dementia in his mother; Diabetes in his father; Heart disease (age of onset: 7) in his father; Hyperlipidemia in his father; Hypertension in his father; Melanoma in his father; Pancreatic cancer in his sister. There is no history of Colon cancer, Stomach cancer, or Esophageal cancer.  ROS:   Please see the history of present illness.    ROS  All other systems reviewed and negative.   EKGs/Labs/Other Studies Reviewed:    The following studies were reviewed today: none   Recent Labs: 06/17/2023: TSH 0.65 08/05/2023: ALT 43; BUN 15; Creatinine, Ser 0.77; Hemoglobin 13.2; Platelets 145; Potassium 4.2; Sodium 137   Recent Lipid Panel    Component Value Date/Time   CHOL 123 06/17/2023 1128   CHOL 117 05/27/2022 0849   TRIG 75.0 06/17/2023 1128   HDL 42.90 06/17/2023 1128   HDL 42 05/27/2022 0849   CHOLHDL 3 06/17/2023 1128   VLDL 15.0 06/17/2023 1128   LDLCALC 65 06/17/2023 1128   LDLCALC 62 05/27/2022 0849   LDLDIRECT 153.1 06/09/2011 0942    Physical Exam:    VS:  BP (!) 142/88   Pulse (!) 54   Ht 5\' 11"  (1.803 m)   Wt 250 lb 3.2 oz (113.5 kg)   SpO2 98%   BMI 34.90 kg/m     Wt Readings from Last 3 Encounters:  09/03/23 250 lb 3.2 oz (113.5 kg)  09/02/23 245 lb (111.1 kg)  08/06/23 249 lb (112.9 kg)    GEN: Well nourished, well developed in no  acute distress HEENT: Normal NECK: No JVD; No carotid bruits LYMPHATICS: No lymphadenopathy CARDIAC:RRR, no murmurs, rubs, gallops RESPIRATORY:  Clear to auscultation without rales, wheezing or rhonchi  ABDOMEN: Soft, non-tender, non-distended MUSCULOSKELETAL:  No edema; No deformity  SKIN: Warm and dry NEUROLOGIC:  Alert and oriented x 3 PSYCHIATRIC:  Normal affect    ASSESSMENT:    1. Coronary artery disease involving native coronary artery of native heart without angina pectoris   2. Essential hypertension   3. Pure hypercholesterolemia   4. OSA treated with BiPAP     PLAN:    In order of problems listed above:  1.  ASCAD -s/p inferior STEMI with cath showing 95% PDA that was felt to be the culprit vessel. This was treated with PCI-DES. Also noted was CTO CFX with collaterals from the PDA on OM1. There was an unsuccessful attempt at opening the CFX. Also has 70% mid LAD lesion, to be treated medically. EF was 60% with inferobasilar HK. -He has not had any anginal symptoms since I saw him last -Continue prescription drug management with aspirin 81 mg daily, atorvastatin 80 mg daily, Plavix 75 mg daily, Toprol-XL 25 mg daily with as needed refills  2.  HTN -BP is well-controlled on exam today -Continue prescription drug management with telmisartan 80 mg daily and Toprol-XL 25 mg daily with as needed refills -I have personally reviewed and interpreted outside labs performed by patient's PCP which showed serum creatinine 0.77 and potassium 4.2 on 08/05/2023   3.  HLD -LDL goal < 70 -I have personally reviewed and interpreted outside labs performed by patient's PCP which showed LDL 65 and HDL 42 on 06/17/2023 -Continue prescription drug management with atorvastatin 80 mg daily with as needed refills>>he was recently dx with autoimmune hepatitis and is on Imuran and steroids  4.  OSA - The patient is tolerating PAP therapy well without any problems. The PAP download performed by his DME was personally reviewed and interpreted by me today and showed an AHI of 4/hr on auto BiPAP  with 94% compliance in using more than 4 hours nightly.  The patient has been using and benefiting from PAP use and will continue to benefit from therapy.     Medication Adjustments/Labs and Tests Ordered: Current medicines are reviewed at length with the patient today.   Concerns regarding medicines are outlined above.  No orders of the defined types were placed in this encounter.  Meds ordered this encounter  Medications   nitroGLYCERIN (NITROSTAT) 0.4 MG SL tablet    Sig: Place 1 tablet (0.4 mg total) under the tongue every 5 (five) minutes as needed for chest pain.    Dispense:  25 tablet    Refill:  3    Signed, Armanda Magic, MD  09/03/2023 8:25 AM     [facility]

## 2023-09-04 ENCOUNTER — Ambulatory Visit: Payer: Medicare Other | Admitting: Cardiology

## 2023-09-07 NOTE — Telephone Encounter (Signed)
Refill the budesonide and continue that with azathioprine until blood work

## 2023-09-14 ENCOUNTER — Other Ambulatory Visit: Payer: Self-pay

## 2023-09-14 ENCOUNTER — Other Ambulatory Visit (INDEPENDENT_AMBULATORY_CARE_PROVIDER_SITE_OTHER): Payer: Medicare Other

## 2023-09-14 DIAGNOSIS — K754 Autoimmune hepatitis: Secondary | ICD-10-CM

## 2023-09-14 LAB — HEPATIC FUNCTION PANEL
ALT: 17 U/L (ref 0–53)
AST: 20 U/L (ref 0–37)
Albumin: 3.8 g/dL (ref 3.5–5.2)
Alkaline Phosphatase: 61 U/L (ref 39–117)
Bilirubin, Direct: 0.2 mg/dL (ref 0.0–0.3)
Total Bilirubin: 0.7 mg/dL (ref 0.2–1.2)
Total Protein: 6.6 g/dL (ref 6.0–8.3)

## 2023-09-14 LAB — CBC WITH DIFFERENTIAL/PLATELET
Basophils Absolute: 0 10*3/uL (ref 0.0–0.1)
Basophils Relative: 0.8 % (ref 0.0–3.0)
Eosinophils Absolute: 0.1 10*3/uL (ref 0.0–0.7)
Eosinophils Relative: 2 % (ref 0.0–5.0)
HCT: 41 % (ref 39.0–52.0)
Hemoglobin: 13.3 g/dL (ref 13.0–17.0)
Lymphocytes Relative: 23.7 % (ref 12.0–46.0)
Lymphs Abs: 1.2 10*3/uL (ref 0.7–4.0)
MCHC: 32.3 g/dL (ref 30.0–36.0)
MCV: 85.4 fl (ref 78.0–100.0)
Monocytes Absolute: 0.6 10*3/uL (ref 0.1–1.0)
Monocytes Relative: 11.8 % (ref 3.0–12.0)
Neutro Abs: 3.1 10*3/uL (ref 1.4–7.7)
Neutrophils Relative %: 61.7 % (ref 43.0–77.0)
Platelets: 135 10*3/uL — ABNORMAL LOW (ref 150.0–400.0)
RBC: 4.81 Mil/uL (ref 4.22–5.81)
RDW: 17.2 % — ABNORMAL HIGH (ref 11.5–15.5)
WBC: 5 10*3/uL (ref 4.0–10.5)

## 2023-09-15 ENCOUNTER — Other Ambulatory Visit: Payer: Self-pay | Admitting: Internal Medicine

## 2023-10-01 ENCOUNTER — Encounter (INDEPENDENT_AMBULATORY_CARE_PROVIDER_SITE_OTHER): Payer: Self-pay | Admitting: Adult Health

## 2023-10-01 ENCOUNTER — Ambulatory Visit (INDEPENDENT_AMBULATORY_CARE_PROVIDER_SITE_OTHER): Payer: Medicare Other | Admitting: Adult Health

## 2023-10-01 VITALS — BP 122/75 | HR 57 | Temp 97.8°F | Ht 71.0 in | Wt 248.0 lb

## 2023-10-01 DIAGNOSIS — K760 Fatty (change of) liver, not elsewhere classified: Secondary | ICD-10-CM

## 2023-10-01 DIAGNOSIS — E6689 Other obesity not elsewhere classified: Secondary | ICD-10-CM

## 2023-10-01 DIAGNOSIS — Z6834 Body mass index (BMI) 34.0-34.9, adult: Secondary | ICD-10-CM

## 2023-10-01 DIAGNOSIS — R7303 Prediabetes: Secondary | ICD-10-CM

## 2023-10-01 DIAGNOSIS — E66812 Morbid (severe) obesity due to excess calories: Secondary | ICD-10-CM

## 2023-10-01 NOTE — Progress Notes (Signed)
WEIGHT SUMMARY AND BIOMETRICS  Vitals Temp: 97.8 F (36.6 C) BP: 122/75 Pulse Rate: (!) 57 SpO2: 96 %   Anthropometric Measurements Height: 5\' 11"  (1.803 m) Weight: 248 lb (112.5 kg) BMI (Calculated): 34.6 Weight at Last Visit: 245lb Weight Lost Since Last Visit: 0 Weight Gained Since Last Visit: 3lb Starting Weight: 266lb Total Weight Loss (lbs): 18 lb (8.165 kg)   Body Composition  Body Fat %: 35.3 % Fat Mass (lbs): 87.8 lbs Muscle Mass (lbs): 153 lbs Total Body Water (lbs): 110.8 lbs Visceral Fat Rating : 22   Other Clinical Data Fasting: no Labs: no Today's Visit #: 23 Starting Date: 01/01/21    Chief Complaint:   OBESITY [name] is here to discuss his progress with his obesity treatment plan. He is on the the Category 3 Plan and states he is following his eating plan approximately 70 % of the time. He states he is exercising Swim/Weight 60/60 minutes 5/21 times per week.   Interim History:  08/06/2023 [name] Rx sent in- STILL UNABLE TO FIND TO START  [name] provided the following food recall that is typical of a day- Breakfast: Scrambled eggs/toast OR Kodiak waffles OR Greek yogurt Snack: Fruit Lunch: [place] sandwich with cheese, 45 cal bread OR Lean Cuisine frozen meals Dinner- varies, however he is mindful of portion size and protein content  Reviewed Bioimpedance results with pt: Muscle Mass: +2.8 lbs Adipose Mass: No change  Subjective:   1. NAFLD (nonalcoholic fatty liver disease) 9/52/8413 US Abdomen Limited RUQ  IMPRESSION: 1. Increased hepatic parenchymal echogenicity suggestive of steatosis. 2. No cholelithiasis or sonographic evidence for acute cholecystitis.  2. Pre-diabetes Lab Results  Component Value Date   HGBA1C 6.2 06/17/2023   HGBA1C 5.9 (H) 02/24/2023   HGBA1C 5.7 (H) 10/23/2022    He is on Metformin 500mg - 2 tabs at breakfast. He denies GI upset. MAR reflects last Refill from [facility] in June 2024- pt states hat  he has been receiving auto refills from Pharmacy ???  Assessment/Plan:   1. NAFLD (nonalcoholic fatty liver disease) Avoid Hepatotoxic substances Monitor Labs  2. Pre-diabetes Continue Metformin therapy and Cat 3 Meal Plan  3. Obesity, Current BMI 34.6  [name] is currently in the action stage of change. As such, his goal is to continue with weight loss efforts. He has agreed to the Category 3 Plan.   Exercise goals: Older adults should follow the adult guidelines. When older adults cannot meet the adult guidelines, they should be as physically active as their abilities and conditions will allow.  Older adults should do exercises that maintain or improve balance if they are at risk of falling.  Older adults should determine their level of effort for physical activity relative to their level of fitness.  Older adults with chronic conditions should understand whether and how their conditions affect their ability to do regular physical activity safely.  Behavioral modification strategies: increasing lean protein intake, decreasing simple carbohydrates, increasing vegetables, increasing water intake, no skipping meals, meal planning and cooking strategies, and planning for success.  [name] has agreed to follow-up with our clinic in 4 weeks. He was informed of the importance of frequent follow-up visits to maximize his success with intensive lifestyle modifications for his multiple health conditions.   Objective:   Blood pressure 122/75, pulse (!) 57, temperature 97.8 F (36.6 C), height 5\' 11"  (1.803 m), weight 248 lb (112.5 kg), SpO2 96%. Body mass index is 34.59 kg/m.  General: Cooperative, alert, well  developed, in no acute distress. HEENT: Conjunctivae and lids unremarkable. Cardiovascular: Regular rhythm.  Lungs: Normal work of breathing. Neurologic: No focal deficits.   Lab Results  Component Value Date   CREATININE 0.77 08/05/2023   BUN 15 08/05/2023   NA 137 08/05/2023   K  4.2 08/05/2023   CL 107 08/05/2023   CO2 22 08/05/2023   Lab Results  Component Value Date   ALT 17 09/14/2023   AST 20 09/14/2023   ALKPHOS 61 09/14/2023   BILITOT 0.7 09/14/2023   Lab Results  Component Value Date   HGBA1C 6.2 06/17/2023   HGBA1C 5.9 (H) 02/24/2023   HGBA1C 5.7 (H) 10/23/2022   HGBA1C 5.8 (H) 06/09/2022   HGBA1C 5.5 01/09/2022   Lab Results  Component Value Date   INSULIN 15.6 02/24/2023   INSULIN 19.4 10/23/2022   INSULIN 15.2 06/09/2022   INSULIN 8.4 01/09/2022   INSULIN 22.7 08/19/2021   Lab Results  Component Value Date   TSH 0.65 06/17/2023   Lab Results  Component Value Date   CHOL 123 06/17/2023   HDL 42.90 06/17/2023   LDLCALC 65 06/17/2023   LDLDIRECT 153.1 06/09/2011   TRIG 75.0 06/17/2023   CHOLHDL 3 06/17/2023   Lab Results  Component Value Date   VD25OH 61.69 08/14/2023   VD25OH 46.8 02/24/2023   VD25OH 60.2 10/23/2022   Lab Results  Component Value Date   WBC 5.0 09/14/2023   HGB 13.3 09/14/2023   HCT 41.0 09/14/2023   MCV 85.4 09/14/2023   PLT 135.0 (L) 09/14/2023   Lab Results  Component Value Date   FERRITIN 20.6 (L) 07/21/2023   Attestation Statements:   Reviewed by clinician on day of visit: allergies, medications, problem list, medical history, surgical history, family history, social history, and previous encounter notes.  Time spent on visit including pre-visit chart review and post-visit care and charting was 27 minutes.   I have reviewed the above documentation for accuracy and completeness, and I agree with the above. -  Avanell Banwart d. Arius Harnois, NP-C

## 2023-10-14 ENCOUNTER — Other Ambulatory Visit: Payer: Self-pay | Admitting: Cardiology

## 2023-10-16 ENCOUNTER — Other Ambulatory Visit (INDEPENDENT_AMBULATORY_CARE_PROVIDER_SITE_OTHER): Payer: Medicare Other

## 2023-10-16 DIAGNOSIS — K754 Autoimmune hepatitis: Secondary | ICD-10-CM | POA: Diagnosis not present

## 2023-10-16 LAB — HEPATIC FUNCTION PANEL
ALT: 18 U/L (ref 0–53)
AST: 22 U/L (ref 0–37)
Albumin: 3.9 g/dL (ref 3.5–5.2)
Alkaline Phosphatase: 59 U/L (ref 39–117)
Bilirubin, Direct: 0.1 mg/dL (ref 0.0–0.3)
Total Bilirubin: 0.6 mg/dL (ref 0.2–1.2)
Total Protein: 6.6 g/dL (ref 6.0–8.3)

## 2023-10-17 LAB — IGG: IgG (Immunoglobin G), Serum: 1170 mg/dL (ref 600–1540)

## 2023-10-18 DIAGNOSIS — Z23 Encounter for immunization: Secondary | ICD-10-CM | POA: Diagnosis not present

## 2023-10-20 ENCOUNTER — Other Ambulatory Visit: Payer: Self-pay

## 2023-10-20 DIAGNOSIS — R7989 Other specified abnormal findings of blood chemistry: Secondary | ICD-10-CM

## 2023-10-20 DIAGNOSIS — K754 Autoimmune hepatitis: Secondary | ICD-10-CM

## 2023-10-27 ENCOUNTER — Other Ambulatory Visit (HOSPITAL_COMMUNITY): Payer: Self-pay

## 2023-10-30 ENCOUNTER — Other Ambulatory Visit (HOSPITAL_COMMUNITY): Payer: Self-pay

## 2023-10-30 DIAGNOSIS — K760 Fatty (change of) liver, not elsewhere classified: Secondary | ICD-10-CM | POA: Diagnosis not present

## 2023-10-30 DIAGNOSIS — K754 Autoimmune hepatitis: Secondary | ICD-10-CM | POA: Diagnosis not present

## 2023-10-30 MED ORDER — AZATHIOPRINE 50 MG PO TABS
50.0000 mg | ORAL_TABLET | Freq: Every day | ORAL | 3 refills | Status: DC
  Filled 2023-10-30: qty 90, 90d supply, fill #0

## 2023-11-05 ENCOUNTER — Ambulatory Visit (INDEPENDENT_AMBULATORY_CARE_PROVIDER_SITE_OTHER): Payer: Medicare Other | Admitting: Adult Health

## 2023-11-11 ENCOUNTER — Encounter (INDEPENDENT_AMBULATORY_CARE_PROVIDER_SITE_OTHER): Payer: Self-pay | Admitting: Adult Health

## 2023-11-11 ENCOUNTER — Ambulatory Visit (INDEPENDENT_AMBULATORY_CARE_PROVIDER_SITE_OTHER): Payer: Medicare Other | Admitting: Adult Health

## 2023-11-11 ENCOUNTER — Other Ambulatory Visit (HOSPITAL_COMMUNITY): Payer: Self-pay

## 2023-11-11 VITALS — BP 128/74 | HR 62 | Temp 98.6°F | Ht 71.0 in | Wt 254.0 lb

## 2023-11-11 DIAGNOSIS — Z6835 Body mass index (BMI) 35.0-35.9, adult: Secondary | ICD-10-CM | POA: Diagnosis not present

## 2023-11-11 DIAGNOSIS — E669 Obesity, unspecified: Secondary | ICD-10-CM | POA: Diagnosis not present

## 2023-11-11 DIAGNOSIS — K754 Autoimmune hepatitis: Secondary | ICD-10-CM

## 2023-11-11 DIAGNOSIS — R7303 Prediabetes: Secondary | ICD-10-CM | POA: Diagnosis not present

## 2023-11-11 DIAGNOSIS — H2513 Age-related nuclear cataract, bilateral: Secondary | ICD-10-CM | POA: Diagnosis not present

## 2023-11-11 DIAGNOSIS — H31002 Unspecified chorioretinal scars, left eye: Secondary | ICD-10-CM | POA: Diagnosis not present

## 2023-11-11 DIAGNOSIS — H353121 Nonexudative age-related macular degeneration, left eye, early dry stage: Secondary | ICD-10-CM | POA: Diagnosis not present

## 2023-11-11 DIAGNOSIS — H5213 Myopia, bilateral: Secondary | ICD-10-CM | POA: Diagnosis not present

## 2023-11-11 MED ORDER — WEGOVY 0.25 MG/0.5ML ~~LOC~~ SOAJ
0.2500 mg | SUBCUTANEOUS | 0 refills | Status: DC
Start: 2023-11-11 — End: 2024-01-21
  Filled 2023-11-11: qty 2, 28d supply, fill #0

## 2023-11-11 NOTE — Progress Notes (Signed)
WEIGHT SUMMARY AND BIOMETRICS  Vitals Temp: 98.6 F (37 C) BP: 128/74 Pulse Rate: 62 SpO2: 97 %   Anthropometric Measurements Height: 5\' 11"  (1.803 m) Weight: 254 lb (115.2 kg) BMI (Calculated): 35.44 Weight at Last Visit: 248 lb Weight Lost Since Last Visit: 0 Weight Gained Since Last Visit: 6 lb Starting Weight: 266 lb Total Weight Loss (lbs): 12 lb (5.443 kg)   Body Composition  Body Fat %: 36 % Fat Mass (lbs): 91.6 lbs Muscle Mass (lbs): 154.6 lbs Total Body Water (lbs): 112 lbs Visceral Fat Rating : 23   Other Clinical Data Fasting: No Labs: No Today's Visit #: 24 Starting Date: 01/01/21    Chief Complaint:   OBESITY [name] is here to discuss his progress with his obesity treatment plan. He is on the the Category 3 Plan and states he is following his eating plan approximately 50 % of the time.  He states he is exercising Swimming, Weight Lifting 60 minutes 5 times per week.   Interim History:  He is on Imuran and Entocort for Autoimmune Hepatitis He established with [facility] on 10/30/2023- reviewed encounter notes  Wegovy was APPROVED Aug 2024- he never started due to [employer] ([employer]) WILL SEND IN TODAY  Subjective:   1. Pre-diabetes Lab Results  Component Value Date   HGBA1C 6.2 06/17/2023   HGBA1C 5.9 (H) 02/24/2023   HGBA1C 5.7 (H) 10/23/2022    He is on Metformin 500mg - 2 tabs every day He denies GI upset  He denies family hx of MEN 2 or MTC He denies personal hx of pancreatitis  Reginal Lutes was APPROVED Aug 2024- he never started due to [employer] ([employer])  2. Autoimmune hepatitis [facility]) HPI:    [name] is a 72 year old male with a past medical history as listed below including reflux, A-fib on Plavix, CHF (12/31/2018 echo with LVEF 65-70%), osteoarthritis and multiple others, known to Dr. Rhea Belton, who was referred to me by Pincus Sanes, MD for a complaint of elevated LFTs.       04/30/2015 colonoscopy for surveillance due to prior colonic neoplasia was normal.  Repeat recommended in 10 years.    06/17/2023 CMP with AST 117, ALT 155 (02/24/2023 AST 42 and ALT 47, 10/23/2022 AST 51 and ALT 61, 06/09/2022 normal).    07/03/2023 AST 63 and ALT 104.  ANA positive, ASMA elevated at 41.  Hepatitis testing negative.    07/08/2023 right upper quadrant ultrasound with increased hepatic parenchymal echogenicity suggestive of steatosis and otherwise normal.    Today, patient presents to clinic accompanied by his wife who assists with his history.  He explains to me that he really feels completely fine but was told that he had elevated liver enzymes.  Apparently these were first noticed when he went to a weight loss clinic a couple of years ago and then after he lost about 20 pounds they normalized and recently have started to go back up per him.  He has no family history of liver disease or prior history of hepatitis or tattoos.  They have had testing as above and have some questions.    Denies fever, chills or weight loss. Assessment: 1.  Elevated LFTs: Elevated consistently since October of last year, recent ultrasound with fatty liver but ASMA and ANA positive; concern for autoimmune hepatitis versus fatty liver versus other 2.  Fatty liver   Plan: 1. Ordered labs to include IGG, Ferritin and AMA  2.  Scheduled patient for ultrasound-guided liver biopsy for further evaluation of possible autoimmune hepatitis versus fatty liver.  Patient was advised that he will need to hold his Plavix for 5 days prior to time of this procedure and hold it for 48 hours after.  We will communicate with his prescribing physician to ensure this is acceptable for him. 3.  Answered all the patient's questions. 4.  Patient to follow in clinic per recommendations after imaging/labs above.   10/30/2023- he established with [facility]- reviewed encounter notes  He will stop budesonide  approximately 11/13/2023   Assessment/Plan:   1. Pre-diabetes Continue Metformin 500mg - 2 tabs every day [facility] as directed  2. Autoimmune hepatitis (HCC) Start  Semaglutide-Weight Management (WEGOVY) 0.25 MG/0.5ML SOAJ Inject 0.25 mg into the skin once a week. Dispense: 2 mL, Refills: 0 of 0 remaining   3. Obesity, Starting BMI 37.10 Start  Semaglutide-Weight Management (WEGOVY) 0.25 MG/0.5ML SOAJ Inject 0.25 mg into the skin once a week. Dispense: 2 mL, Refills: 0 of 0 remaining   [name] is currently in the action stage of change. As such, his goal is to continue with weight loss efforts. He has agreed to the Category 3 Plan.   Exercise goals: Older adults should follow the adult guidelines. When older adults cannot meet the adult guidelines, they should be as physically active as their abilities and conditions will allow.  Older adults should do exercises that maintain or improve balance if they are at risk of falling.  Older adults should determine their level of effort for physical activity relative to their level of fitness.  Older adults with chronic conditions should understand whether and how their conditions affect their ability to do regular physical activity safely.  Behavioral modification strategies: increasing lean protein intake, decreasing simple carbohydrates, increasing vegetables, increasing water intake, no skipping meals, meal planning and cooking strategies, and planning for success.  [name] has agreed to follow-up with our clinic in 4 weeks. He was informed of the importance of frequent follow-up visits to maximize his success with intensive lifestyle modifications for his multiple health conditions.   Objective:   Blood pressure 128/74, pulse 62, temperature 98.6 F (37 C), height 5\' 11"  (1.803 m), weight 254 lb (115.2 kg), SpO2 97%. Body mass index is 35.43 kg/m.  General: Cooperative, alert, well developed, in no acute distress. HEENT: Conjunctivae  and lids unremarkable. Cardiovascular: Regular rhythm.  Lungs: Normal work of breathing. Neurologic: No focal deficits.   Lab Results  Component Value Date   CREATININE 0.77 08/05/2023   BUN 15 08/05/2023   NA 137 08/05/2023   K 4.2 08/05/2023   CL 107 08/05/2023   CO2 22 08/05/2023   Lab Results  Component Value Date   ALT 18 10/16/2023   AST 22 10/16/2023   ALKPHOS 59 10/16/2023   BILITOT 0.6 10/16/2023   Lab Results  Component Value Date   HGBA1C 6.2 06/17/2023   HGBA1C 5.9 (H) 02/24/2023   HGBA1C 5.7 (H) 10/23/2022   HGBA1C 5.8 (H) 06/09/2022   HGBA1C 5.5 01/09/2022   Lab Results  Component Value Date   INSULIN 15.6 02/24/2023   INSULIN 19.4 10/23/2022   INSULIN 15.2 06/09/2022   INSULIN 8.4 01/09/2022   INSULIN 22.7 08/19/2021   Lab Results  Component Value Date   TSH 0.65 06/17/2023   Lab Results  Component Value Date   CHOL 123 06/17/2023   HDL 42.90 06/17/2023   LDLCALC 65 06/17/2023  LDLDIRECT 153.1 06/09/2011   TRIG 75.0 06/17/2023   CHOLHDL 3 06/17/2023   Lab Results  Component Value Date   VD25OH 61.69 08/14/2023   VD25OH 46.8 02/24/2023   VD25OH 60.2 10/23/2022   Lab Results  Component Value Date   WBC 5.0 09/14/2023   HGB 13.3 09/14/2023   HCT 41.0 09/14/2023   MCV 85.4 09/14/2023   PLT 135.0 (L) 09/14/2023   Lab Results  Component Value Date   FERRITIN 20.6 (L) 07/21/2023   Attestation Statements:   Reviewed by clinician on day of visit: allergies, medications, problem list, medical history, surgical history, family history, social history, and previous encounter notes.  I have reviewed the above documentation for accuracy and completeness, and I agree with the above. -  Paislie Tessler d. Ebin Palazzi, NP-C

## 2023-11-12 ENCOUNTER — Other Ambulatory Visit (HOSPITAL_COMMUNITY): Payer: Self-pay

## 2023-11-12 ENCOUNTER — Telehealth (INDEPENDENT_AMBULATORY_CARE_PROVIDER_SITE_OTHER): Payer: Self-pay | Admitting: Adult Health

## 2023-11-12 ENCOUNTER — Telehealth: Payer: Self-pay | Admitting: *Deleted

## 2023-11-12 ENCOUNTER — Telehealth: Payer: Self-pay

## 2023-11-12 ENCOUNTER — Encounter (INDEPENDENT_AMBULATORY_CARE_PROVIDER_SITE_OTHER): Payer: Self-pay | Admitting: Adult Health

## 2023-11-12 DIAGNOSIS — K759 Inflammatory liver disease, unspecified: Secondary | ICD-10-CM | POA: Diagnosis not present

## 2023-11-12 NOTE — Telephone Encounter (Signed)
   Name: [name]  DOB: 08/30/1951  MRN: [id number]  Primary Cardiologist: Armanda Magic, MD   Preoperative team, please contact this patient and set up a phone call appointment for further preoperative risk assessment. Please obtain consent and complete medication review. Thank you for your help.  I confirm that guidance regarding antiplatelet and oral anticoagulation therapy has been completed and, if necessary, noted below.  Per office protocol, if patient is without any new symptoms or concerns at the time of their virtual visit, he may hold Plavix for 5 days prior to procedure. Please resume Plavix as soon as possible postprocedure, at the discretion of the surgeon.   Regarding ASA therapy, we recommend continuation of ASA throughout the perioperative period.  However, if the surgeon feels that cessation of ASA is required in the perioperative period, it may be stopped 5-7 days prior to surgery with a plan to resume it as soon as felt to be feasible from a surgical standpoint in the post-operative period.  I also confirmed the patient resides in the state of [place]. As per [employer] Medical Board telemedicine laws, the patient must reside in the state in which the provider is licensed.   Joylene Grapes, NP 11/12/2023, 3:56 PM [facility]

## 2023-11-12 NOTE — Telephone Encounter (Signed)
Pt  has been added on 12/17/23 ok per [name], NP due to procedure date and med hold.    Med rec and consent are done.

## 2023-11-12 NOTE — Telephone Encounter (Addendum)
   Pre-operative Risk Assessment    Patient Name: [name]  DOB: [birth year] MRN: [id number]  Last office visit : 09/03/23 Upcoming appointment : NA     Request for Surgical Clearance    Procedure:   Lumbar Epidural Steroid Injection  Date of Surgery:  Clearance 11/24/23                                   Surgeon:  Dr. Ethelene Hal Surgeon's Group or Practice Name:  [facility]  Phone number:  [phone] Fax number:  [phone]   Type of Clearance Requested:   - Medical  - Pharmacy:  Hold Aspirin and Clopidogrel (Plavix) 7 days before procedure   Type of Anesthesia:  Not Indicated   Additional requests/questions:    Elyse Jarvis   11/12/2023, 3:42 PM

## 2023-11-12 NOTE — Telephone Encounter (Signed)
Patient called stating that he needs to speak with someone regarding his [facility] RX. Pt states that Baxter Hire had left him a message a few weeks ago. Please call pt at [phone].

## 2023-11-12 NOTE — Telephone Encounter (Signed)
Pt  has been added on 12/17/23 ok per [name], NP due to procedure date and med hold.   Med rec and consent are done.     Patient Consent for Virtual Visit        [name] has provided verbal consent on 11/12/2023 for a virtual visit (video or telephone).   CONSENT FOR VIRTUAL VISIT FOR:  [name]  By participating in this virtual visit I agree to the following:  I hereby voluntarily request, consent and authorize [facility] and its employed or contracted physicians, physician assistants, nurse practitioners or other licensed health care professionals (the Practitioner), to provide me with telemedicine health care services (the "Services") as deemed necessary by the treating Practitioner. I acknowledge and consent to receive the Services by the Practitioner via telemedicine. I understand that the telemedicine visit will involve communicating with the Practitioner through live audiovisual communication technology and the disclosure of certain medical information by electronic transmission. I acknowledge that I have been given the opportunity to request an in-person assessment or other available alternative prior to the telemedicine visit and am voluntarily participating in the telemedicine visit.  I understand that I have the right to withhold or withdraw my consent to the use of telemedicine in the course of my care at any time, without affecting my right to future care or treatment, and that the Practitioner or I may terminate the telemedicine visit at any time. I understand that I have the right to inspect all information obtained and/or recorded in the course of the telemedicine visit and may receive copies of available information for a reasonable fee.  I understand that some of the potential risks of receiving the Services via telemedicine include:  Delay or interruption in medical evaluation due to technological equipment failure or disruption; Information  transmitted may not be sufficient (e.g. poor resolution of images) to allow for appropriate medical decision making by the Practitioner; and/or  In rare instances, security protocols could fail, causing a breach of personal health information.  Furthermore, I acknowledge that it is my responsibility to provide information about my medical history, conditions and care that is complete and accurate to the best of my ability. I acknowledge that Practitioner's advice, recommendations, and/or decision may be based on factors not within their control, such as incomplete or inaccurate data provided by me or distortions of diagnostic images or specimens that may result from electronic transmissions. I understand that the practice of medicine is not an exact science and that Practitioner makes no warranties or guarantees regarding treatment outcomes. I acknowledge that a copy of this consent can be made available to me via my patient portal [facility] MyChart), or I can request a printed copy by calling the office of [facility].    I understand that my insurance will be billed for this visit.   I have read or had this consent read to me. I understand the contents of this consent, which adequately explains the benefits and risks of the Services being provided via telemedicine.  I have been provided ample opportunity to ask questions regarding this consent and the Services and have had my questions answered to my satisfaction. I give my informed consent for the services to be provided through the use of telemedicine in my medical care

## 2023-11-13 DIAGNOSIS — K754 Autoimmune hepatitis: Secondary | ICD-10-CM | POA: Diagnosis not present

## 2023-11-16 ENCOUNTER — Other Ambulatory Visit (HOSPITAL_COMMUNITY): Payer: Self-pay

## 2023-11-17 ENCOUNTER — Ambulatory Visit: Payer: Medicare Other | Attending: Cardiology | Admitting: Cardiology

## 2023-11-17 DIAGNOSIS — Z01810 Encounter for preprocedural cardiovascular examination: Secondary | ICD-10-CM | POA: Diagnosis not present

## 2023-11-17 NOTE — Progress Notes (Signed)
Virtual Visit via Telephone Note   Because of [name] co-morbid illnesses, he is at least at moderate risk for complications without adequate follow up.  This format is felt to be most appropriate for this patient at this time.  The patient did not have access to video technology/had technical difficulties with video requiring transitioning to audio format only (telephone).  All issues noted in this document were discussed and addressed.  No physical exam could be performed with this format.  Please refer to the patient's chart for his consent to telehealth for [facility].  Evaluation Performed:  Preoperative cardiovascular risk assessment _____________   Date:  11/17/2023   Patient ID:  [name], DOB 06-Nov-1951, MRN [id number] Patient Location:  Home Provider location:   Office  Primary Care Provider:  Pincus Sanes, MD Primary Cardiologist:  Armanda Magic, MD  Chief Complaint / Patient Profile  72 y.o. y/o male with a h/o CAD s/p DES to PDA in 12/2018, CTO circumflex, hypertension, hyperlipidemia and OSA who is pending lumbar epidural steroid injection and presents today for telephonic preoperative cardiovascular risk assessment. History of Present Illness   [name] is a 72 y.o. male who presents via audio/video conferencing for a telehealth visit today.  Pt was last seen in cardiology clinic on 09/03/23 by Dr. Armanda Magic.  At that time [name] was doing well.  The patient is now pending procedure as outlined above. Since his last visit, he has remained stable from a cardiac perspective. He denies chest pain, palpitations, dyspnea, pnd, orthopnea, dizziness, syncope, edema, weight gain, or early satiety.  Past Medical History    Past Medical History:  Diagnosis Date   Back pain    BPH (benign prostatic hypertrophy)    Chest pain    COLONIC POLYPS, HX OF 2007   clear colo w/o polyps 04/2015: 97yr follow up   Constipation    Coronary artery  disease    Decreased mobility    due to BL knee and hip replacement   Fatigue    GERD (gastroesophageal reflux disease)    Heartburn    History of kidney stones    passed   Hyperlipidemia    HYPERTENSION    HYPOTHYROIDISM    Joint pain    Myocardial infarction (HCC) 12/2018   Inferior STEMI   OA (osteoarthritis)    Knees   OBESITY    OSA treated with BiPAP    PAF (paroxysmal atrial fibrillation) (HCC)    Pneumonia    Pre-diabetes    Shortness of breath on exertion    Umbilical hernia    Ventral hernia    Past Surgical History:  Procedure Laterality Date   arthroscopic knee surgery     (R) 2003 & (L) 2010   CARDIAC CATHETERIZATION     COLONOSCOPY  04/2015   no polyps (Pyrtle)   CORONARY/GRAFT ACUTE MI REVASCULARIZATION N/A 12/30/2018   Procedure: Coronary/Graft Acute MI Revascularization;  Surgeon: Lyn Records, MD;  Location: [facility];  Service: Cardiovascular;  Laterality: N/A;   HIP CLOSED REDUCTION Right 03/12/2021   Procedure: CLOSED REDUCTION HIP;  Surgeon: Venita Lick, MD;  Location: [facility];  Service: Orthopedics;  Laterality: Right;   LAMINECTOMY  1991   LEFT HEART CATH AND CORONARY ANGIOGRAPHY N/A 12/30/2018   Procedure: LEFT HEART CATH AND CORONARY ANGIOGRAPHY;  Surgeon: Lyn Records, MD;  Location: [facility];  Service: Cardiovascular;  Laterality: N/A;  TONSILLECTOMY  1990   w/ adenoids and uvula   TONSILLECTOMY     TOTAL HIP ARTHROPLASTY Right 01/2011   [place]   TOTAL HIP ARTHROPLASTY Left 04/16/2016   Procedure: TOTAL HIP ARTHROPLASTY ANTERIOR APPROACH;  Surgeon: Ollen Gross, MD;  Location: [facility];  Service: Orthopedics;  Laterality: Left;   TOTAL HIP REVISION Right 01/04/2020   Procedure: Right hip bearing surface;  Surgeon: Ollen Gross, MD;  Location: [facility];  Service: Orthopedics;  Laterality: Right;    TOTAL HIP REVISION Right 04/15/2021   Procedure: Right hip bearing surface revision;  Surgeon: Ollen Gross,  MD;  Location: [facility];  Service: Orthopedics;  Laterality: Right;    TOTAL KNEE ARTHROPLASTY  12/27/2012   Procedure: TOTAL KNEE BILATERAL;  Surgeon: Loanne Drilling, MD;  Location: [facility];  Service: Orthopedics;  Laterality: Bilateral;   UMBILICAL HERNIA REPAIR N/A 03/11/2023   Procedure: HERNIA REPAIR UMBILICAL ADULT;  Surgeon: Sebastian Ache, MD;  Location: [facility];  Service: Urology;  Laterality: N/A;   WISDOM TOOTH EXTRACTION     XI ROBOTIC ASSISTED SIMPLE PROSTATECTOMY N/A 03/11/2023   Procedure: XI ROBOTIC ASSISTED SIMPLE PROSTATECTOMY;  Surgeon: Sebastian Ache, MD;  Location: [facility];  Service: Urology;  Laterality: N/A;  3 HRS   Allergies Allergies  Allergen Reactions   Bactrim [Sulfamethoxazole-Trimethoprim] Other (See Comments)    ulcers   Home Medications    Prior to Admission medications   Medication Sig Start Date End Date Taking? Authorizing Provider  aspirin 81 MG chewable tablet Chew 1 tablet (81 mg total) by mouth daily. 06/17/23   Pincus Sanes, MD  atorvastatin (LIPITOR) 80 MG tablet Take 1 tablet (80 mg total) by mouth daily. 10/14/23   Quintella Reichert, MD  azaTHIOprine (IMURAN) 50 MG tablet TAKE 1 TABLET BY MOUTH EVERY DAY 09/15/23   Pyrtle, Carie Caddy, MD  azaTHIOprine (IMURAN) 50 MG tablet Take 1 tablet (50 mg total) by mouth daily. 10/30/23     budesonide (ENTOCORT EC) 3 MG 24 hr capsule Take 3 capsules (9 mg total) by mouth daily. 08/11/23   Pyrtle, Carie Caddy, MD  clopidogrel (PLAVIX) 75 MG tablet Take 1 tablet (75 mg total) by mouth daily. 06/17/23   Pincus Sanes, MD  docusate sodium (COLACE) 100 MG capsule Take 100 mg by mouth daily.    [provider]  finasteride (PROSCAR) 5 MG tablet Take 5 mg by mouth daily. 02/11/23   [provider]  levothyroxine (SYNTHROID) 137 MCG tablet Take 1 tablet (137 mcg total) by mouth daily before breakfast. 08/26/23   Pincus Sanes, MD  metFORMIN (GLUCOPHAGE) 500 MG tablet Take 2 tablets (1,000 mg total) by mouth  daily with breakfast. 06/23/23   Bowen, Scot Jun, DO  metoprolol succinate (TOPROL-XL) 25 MG 24 hr tablet TAKE 1 TABLET (25 MG TOTAL) BY MOUTH DAILY. 05/27/23   Quintella Reichert, MD  nitroGLYCERIN (NITROSTAT) 0.4 MG SL tablet Place 1 tablet (0.4 mg total) under the tongue every 5 (five) minutes as needed for chest pain. 09/03/23   Quintella Reichert, MD  Semaglutide-Weight Management (WEGOVY) 0.25 MG/0.5ML SOAJ Inject 0.25 mg into the skin once a week. 11/11/23   Danford, Orpha Bur D, NP  telmisartan (MICARDIS) 80 MG tablet TAKE 1 TABLET BY MOUTH EVERY DAY 02/12/23   Quintella Reichert, MD    Physical Exam    Vital Signs:  [name] does not have vital signs available for review today.  Given telephonic  nature of communication, physical exam is limited. AAOx3. NAD. Normal affect.  Speech and respirations are unlabored.  Accessory Clinical Findings   None Assessment & Plan    1.  Preoperative Cardiovascular Risk Assessment: Lumbar epidural steroid injection [name] perioperative risk of a major cardiac event is 0.9% according to the Revised Cardiac Risk Index (RCRI).  Therefore, he is at low risk for perioperative complications.   His functional capacity is good at 7.25 METs according to the [facility] Activity Status Index (DASI). Recommendations: According to ACC/AHA guidelines, no further cardiovascular testing needed.  The patient may proceed to surgery at acceptable risk.   Antiplatelet and/or Anticoagulation Recommendations:Per office protocol he may hold Aspirin for 5-7 days and Plavix for 5 days prior to procedure. Please resume aspirin and Plavix as soon as possible postprocedure, at the discretion of the surgeon.    The patient was advised that if he develops new symptoms prior to surgery to contact our office to arrange for a follow-up visit, and he verbalized understanding.  A copy of this note will be routed to requesting surgeon.  Time:   Today, I have spent 5 minutes with the patient with  telehealth technology discussing medical history, symptoms, and management plan.     Rip Harbour, NP  11/17/2023, 11:05 AM

## 2023-11-19 ENCOUNTER — Telehealth (INDEPENDENT_AMBULATORY_CARE_PROVIDER_SITE_OTHER): Payer: Self-pay

## 2023-11-19 ENCOUNTER — Telehealth: Payer: Self-pay

## 2023-11-19 NOTE — Telephone Encounter (Signed)
Encounter created as requested.

## 2023-11-19 NOTE — Telephone Encounter (Signed)
-----   Message from Carie Caddy Pyrtle sent at 11/19/2023 12:23 PM EST ----- Regarding: RE: Labs Please copy to chart I contacted Annamarie Major, NP with [facility] AST and ALT markedly increased after budesonide was withdrawn on labs based 11/13/2023 She is recommending to patient that he increase azathioprine to 100 and restart budesonide She will be following labs over the coming weeks  Having increased AST and ALT off budesonide further supports AIH diagnosis  JMP ----- Message ----- From: Chrystie Nose, RN Sent: 11/19/2023  11:26 AM EST To: Beverley Fiedler, MD Subject: FW: Labs                                       Pt was due for LFT's and IGG. Looks like he just had them drawn by [facility]. When would you like repeat labs? ----- Message ----- From: Chrystie Nose, RN Sent: 11/17/2023  12:00 AM EST To: Chrystie Nose, RN Subject: Labs                                           Pt needs labs

## 2023-11-19 NOTE — Telephone Encounter (Signed)
I returned patients call and he said his wife may have made a mistake about who called. He said he was approved for [facility] but was not getting it due to the cost, so he was unsure why anyone would be calling anyway. I don't see any notes in reference to anyone calling him about his [facility] either.

## 2023-11-20 ENCOUNTER — Other Ambulatory Visit (HOSPITAL_COMMUNITY): Payer: Self-pay

## 2023-11-20 MED ORDER — BUDESONIDE 3 MG PO CPEP
6.0000 mg | ORAL_CAPSULE | Freq: Every day | ORAL | 2 refills | Status: DC
  Filled 2023-11-20: qty 180, 90d supply, fill #0
  Filled 2024-02-16: qty 180, 90d supply, fill #1

## 2023-11-23 ENCOUNTER — Other Ambulatory Visit (HOSPITAL_COMMUNITY): Payer: Self-pay

## 2023-11-24 DIAGNOSIS — M5416 Radiculopathy, lumbar region: Secondary | ICD-10-CM | POA: Diagnosis not present

## 2023-11-25 ENCOUNTER — Other Ambulatory Visit (HOSPITAL_COMMUNITY): Payer: Self-pay

## 2023-12-01 DIAGNOSIS — K754 Autoimmune hepatitis: Secondary | ICD-10-CM | POA: Diagnosis not present

## 2023-12-11 ENCOUNTER — Other Ambulatory Visit (INDEPENDENT_AMBULATORY_CARE_PROVIDER_SITE_OTHER): Payer: Self-pay | Admitting: Adult Health

## 2023-12-11 DIAGNOSIS — R7303 Prediabetes: Secondary | ICD-10-CM

## 2023-12-12 ENCOUNTER — Other Ambulatory Visit: Payer: Self-pay | Admitting: Internal Medicine

## 2023-12-14 ENCOUNTER — Ambulatory Visit (INDEPENDENT_AMBULATORY_CARE_PROVIDER_SITE_OTHER): Payer: Medicare Other | Admitting: Adult Health

## 2023-12-15 DIAGNOSIS — K754 Autoimmune hepatitis: Secondary | ICD-10-CM | POA: Diagnosis not present

## 2023-12-29 DIAGNOSIS — K754 Autoimmune hepatitis: Secondary | ICD-10-CM | POA: Diagnosis not present

## 2023-12-30 ENCOUNTER — Other Ambulatory Visit: Payer: Self-pay | Admitting: Cardiology

## 2024-01-06 ENCOUNTER — Ambulatory Visit (INDEPENDENT_AMBULATORY_CARE_PROVIDER_SITE_OTHER): Payer: Medicare Other | Admitting: Adult Health

## 2024-01-07 DIAGNOSIS — H1033 Unspecified acute conjunctivitis, bilateral: Secondary | ICD-10-CM | POA: Diagnosis not present

## 2024-01-19 ENCOUNTER — Other Ambulatory Visit (HOSPITAL_COMMUNITY): Payer: Self-pay

## 2024-01-21 ENCOUNTER — Ambulatory Visit (INDEPENDENT_AMBULATORY_CARE_PROVIDER_SITE_OTHER): Payer: Medicare Other | Admitting: Adult Health

## 2024-01-21 ENCOUNTER — Encounter (INDEPENDENT_AMBULATORY_CARE_PROVIDER_SITE_OTHER): Payer: Self-pay | Admitting: Adult Health

## 2024-01-21 VITALS — BP 126/80 | HR 59 | Temp 97.5°F | Ht 71.0 in | Wt 257.0 lb

## 2024-01-21 DIAGNOSIS — K754 Autoimmune hepatitis: Secondary | ICD-10-CM

## 2024-01-21 DIAGNOSIS — Z6835 Body mass index (BMI) 35.0-35.9, adult: Secondary | ICD-10-CM | POA: Diagnosis not present

## 2024-01-21 DIAGNOSIS — I213 ST elevation (STEMI) myocardial infarction of unspecified site: Secondary | ICD-10-CM

## 2024-01-21 DIAGNOSIS — R7303 Prediabetes: Secondary | ICD-10-CM | POA: Diagnosis not present

## 2024-01-21 DIAGNOSIS — K760 Fatty (change of) liver, not elsewhere classified: Secondary | ICD-10-CM | POA: Diagnosis not present

## 2024-01-21 DIAGNOSIS — E669 Obesity, unspecified: Secondary | ICD-10-CM | POA: Diagnosis not present

## 2024-01-21 MED ORDER — METFORMIN HCL 500 MG PO TABS: 1000.0000 mg | ORAL_TABLET | Freq: Every day | ORAL | 1 refills | Status: DC

## 2024-01-21 MED ORDER — WEGOVY 0.25 MG/0.5ML ~~LOC~~ SOAJ: 0.2500 mg | SUBCUTANEOUS | 0 refills | Status: DC

## 2024-01-21 NOTE — Progress Notes (Signed)
WEIGHT SUMMARY AND BIOMETRICS  Vitals Temp: (!) 97.5 F (36.4 C) BP: 126/80 Pulse Rate: (!) 59 SpO2: 96 %   Anthropometric Measurements Height: 5\' 11"  (1.803 m) Weight: 257 lb (116.6 kg) BMI (Calculated): 35.86 Weight at Last Visit: 254lb Weight Lost Since Last Visit: 0 Weight Gained Since Last Visit: 3lb Starting Weight: 266lb Total Weight Loss (lbs): 9 lb (4.082 kg)   Body Composition  Body Fat %: 35.6 % Fat Mass (lbs): 91.6 lbs Muscle Mass (lbs): 157.6 lbs Total Body Water (lbs): 111.2 lbs Visceral Fat Rating : 23   Other Clinical Data Fasting: no Labs: no Today's Visit #: 25 Starting Date: 01/01/21    Chief Complaint:   OBESITY [name] is here to discuss his progress with his obesity treatment plan. He is on the the Category 3 Plan and states he is following his eating plan approximately 50 % of the time.  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   Interim History:  [name] was approved, however after insurance - out of pocket is >$700/month [name] is cost prohibitive, therefore he did not start.  He has been regularly exercising, less stringent on the eating plan.  Reviewed Bioimpedance results with pt: Muscle Mass: + 3 lbs Adipose Mass: No Change  Subjective:   1. Autoimmune hepatitis (HCC) He is on Imuran and Entocort for Autoimmune Hepatitis  He started consuming two cups of coffee each morning- on recommendation of [facility] He denies current GI upset  2. ST elevation myocardial infarction (STEMI), unspecified artery (HCC) History of CAD status post DES PDA in 12/2018,  Followed by Cards/Dr. Mayford Knife- annual follow-up He again confirmed that he dos not have family hx of MEN 2 or MTC He denies personal hx of pancreatitis Will again attempt FULL coverage of Wegovy  3. NAFLD (nonalcoholic fatty liver disease) 8/65/7846 CLINICAL DATA:  Elevated LFTs   EXAM: ULTRASOUND ABDOMEN  LIMITED RIGHT UPPER QUADRANT   COMPARISON:  None Available.   FINDINGS: Gallbladder:   No gallstones or wall thickening visualized. No sonographic Murphy sign noted by sonographer.   Common bile duct:   Diameter: 6 mm, prominent.   Liver:   Increased echogenicity. No focal lesion. Portal vein is patent on color Doppler imaging with normal direction of blood flow towards the liver.   Other: None.   IMPRESSION: 1. Increased hepatic parenchymal echogenicity suggestive of steatosis. 2. No cholelithiasis or sonographic evidence for acute cholecystitis.  4. Pre-diabetes Lab Results  Component Value Date   HGBA1C 6.2 06/17/2023   HGBA1C 5.9 (H) 02/24/2023   HGBA1C 5.7 (H) 10/23/2022    He is currently on Metformin 500mg - 2 tabs (1000mg ) each morning. He denies GI upset. Will try for FULL [facility] coverage with following indications: Hx of MI Hepatic Steatosis Obesity  Assessment/Plan:   1. Autoimmune hepatitis (HCC) Continue tx regime per hepatology specialist  2. ST elevation myocardial infarction (STEMI), unspecified artery (HCC) Start Semaglutide-Weight Management (WEGOVY) 0.25 MG/0.5ML SOAJ Inject 0.25 mg into the skin once a week. Dispense: 2 mL, Refills: 0 ordered   3. NAFLD (nonalcoholic fatty liver disease) (Primary) Start Semaglutide-Weight Management (WEGOVY) 0.25 MG/0.5ML SOAJ Inject 0.25 mg into the skin once a week. Dispense: 2 mL, Refills: 0 ordered   4. Pre-diabetes Refill   metFORMIN (GLUCOPHAGE) 500 MG tablet Take 2 tablets (1,000 mg total) by mouth daily with breakfast. Dispense: 60 tablet, Refills: 1 ordered    5. Obesity, Current BMI  35.86 Start Semaglutide-Weight Management (WEGOVY) 0.25 MG/0.5ML SOAJ Inject 0.25 mg into the skin once a week. Dispense: 2 mL, Refills: 0 ordered   [name] is currently in the action stage of change. As such, his goal is to continue with weight loss efforts. He has agreed to the Category 3 Plan.   Exercise  goals: Older adults should follow the adult guidelines. When older adults cannot meet the adult guidelines, they should be as physically active as their abilities and conditions will allow.  Older adults should do exercises that maintain or improve balance if they are at risk of falling.  Older adults should determine their level of effort for physical activity relative to their level of fitness.  Older adults with chronic conditions should understand whether and how their conditions affect their ability to do regular physical activity safely.  Behavioral modification strategies: increasing lean protein intake, decreasing simple carbohydrates, increasing vegetables, increasing water intake, meal planning and cooking strategies, keeping healthy foods in the home, ways to avoid boredom eating, ways to avoid night time snacking, and planning for success.  [name] has agreed to follow-up with our clinic in 4 weeks. He was informed of the importance of frequent follow-up visits to maximize his success with intensive lifestyle modifications for his multiple health conditions.   Check Fasting Labs and IC at next OV- pt aware to arrive 30 mins early and to be fasting.  Objective:   Blood pressure 126/80, pulse (!) 59, temperature (!) 97.5 F (36.4 C), height 5\' 11"  (1.803 m), weight 257 lb (116.6 kg), SpO2 96%. Body mass index is 35.84 kg/m.  General: Cooperative, alert, well developed, in no acute distress. HEENT: Conjunctivae and lids unremarkable. Cardiovascular: Regular rhythm.  Lungs: Normal work of breathing. Neurologic: No focal deficits.   Lab Results  Component Value Date   CREATININE 0.77 08/05/2023   BUN 15 08/05/2023   NA 137 08/05/2023   K 4.2 08/05/2023   CL 107 08/05/2023   CO2 22 08/05/2023   Lab Results  Component Value Date   ALT 18 10/16/2023   AST 22 10/16/2023   ALKPHOS 59 10/16/2023   BILITOT 0.6 10/16/2023   Lab Results  Component Value Date   HGBA1C 6.2  06/17/2023   HGBA1C 5.9 (H) 02/24/2023   HGBA1C 5.7 (H) 10/23/2022   HGBA1C 5.8 (H) 06/09/2022   HGBA1C 5.5 01/09/2022   Lab Results  Component Value Date   INSULIN 15.6 02/24/2023   INSULIN 19.4 10/23/2022   INSULIN 15.2 06/09/2022   INSULIN 8.4 01/09/2022   INSULIN 22.7 08/19/2021   Lab Results  Component Value Date   TSH 0.65 06/17/2023   Lab Results  Component Value Date   CHOL 123 06/17/2023   HDL 42.90 06/17/2023   LDLCALC 65 06/17/2023   LDLDIRECT 153.1 06/09/2011   TRIG 75.0 06/17/2023   CHOLHDL 3 06/17/2023   Lab Results  Component Value Date   VD25OH 61.69 08/14/2023   VD25OH 46.8 02/24/2023   VD25OH 60.2 10/23/2022   Lab Results  Component Value Date   WBC 5.0 09/14/2023   HGB 13.3 09/14/2023   HCT 41.0 09/14/2023   MCV 85.4 09/14/2023   PLT 135.0 (L) 09/14/2023   Lab Results  Component Value Date   FERRITIN 20.6 (L) 07/21/2023   Attestation Statements:   Reviewed by clinician on day of visit: allergies, medications, problem list, medical history, surgical history, family history, social history, and previous encounter notes.  I have reviewed the above documentation for accuracy  and completeness, and I agree with the above. -  [name] d. [name] Miceli, NP-C

## 2024-01-22 ENCOUNTER — Encounter (INDEPENDENT_AMBULATORY_CARE_PROVIDER_SITE_OTHER): Payer: Self-pay | Admitting: Adult Health

## 2024-01-22 ENCOUNTER — Other Ambulatory Visit (HOSPITAL_COMMUNITY): Payer: Self-pay

## 2024-01-26 DIAGNOSIS — K754 Autoimmune hepatitis: Secondary | ICD-10-CM | POA: Diagnosis not present

## 2024-02-03 ENCOUNTER — Other Ambulatory Visit: Payer: Self-pay | Admitting: Cardiology

## 2024-02-16 ENCOUNTER — Other Ambulatory Visit (HOSPITAL_COMMUNITY): Payer: Self-pay

## 2024-02-17 ENCOUNTER — Other Ambulatory Visit (HOSPITAL_COMMUNITY): Payer: Self-pay

## 2024-02-18 ENCOUNTER — Ambulatory Visit (INDEPENDENT_AMBULATORY_CARE_PROVIDER_SITE_OTHER): Payer: Medicare Other | Admitting: Adult Health

## 2024-02-19 ENCOUNTER — Other Ambulatory Visit (INDEPENDENT_AMBULATORY_CARE_PROVIDER_SITE_OTHER): Payer: Self-pay | Admitting: Adult Health

## 2024-02-19 DIAGNOSIS — R7303 Prediabetes: Secondary | ICD-10-CM

## 2024-02-23 DIAGNOSIS — K760 Fatty (change of) liver, not elsewhere classified: Secondary | ICD-10-CM | POA: Diagnosis not present

## 2024-02-23 DIAGNOSIS — K754 Autoimmune hepatitis: Secondary | ICD-10-CM | POA: Diagnosis not present

## 2024-03-07 DIAGNOSIS — K754 Autoimmune hepatitis: Secondary | ICD-10-CM | POA: Diagnosis not present

## 2024-03-07 DIAGNOSIS — K760 Fatty (change of) liver, not elsewhere classified: Secondary | ICD-10-CM | POA: Diagnosis not present

## 2024-03-14 ENCOUNTER — Ambulatory Visit (INDEPENDENT_AMBULATORY_CARE_PROVIDER_SITE_OTHER): Payer: Medicare Other

## 2024-03-14 VITALS — Ht 68.5 in | Wt 257.0 lb

## 2024-03-14 DIAGNOSIS — Z0000 Encounter for general adult medical examination without abnormal findings: Secondary | ICD-10-CM

## 2024-03-14 NOTE — Progress Notes (Signed)
 Subjective:   [name] is a 73 y.o. who presents for a Medicare Wellness preventive visit.  Visit Complete: Virtual I connected with  AJMAL KATHAN on 03/14/24 by a audio enabled telemedicine application and verified that I am speaking with the correct person using two identifiers.  Patient Location: Home  Provider Location: Home Office  I discussed the limitations of evaluation and management by telemedicine. The patient expressed understanding and agreed to proceed.  Vital Signs: Because this visit was a virtual/telehealth visit, some criteria may be missing or patient reported. Any vitals not documented were not able to be obtained and vitals that have been documented are patient reported.  VideoDeclined- This patient declined Librarian, academic. Therefore the visit was completed with audio only.  Persons Participating in Visit: Patient.  AWV Questionnaire: Yes: Patient Medicare AWV questionnaire was completed by the patient on 03/10/2024; I have confirmed that all information answered by patient is correct and no changes since this date.  Cardiac Risk Factors include: advanced age (>50men, >76 women);hypertension;male gender     Objective:    Today's Vitals   03/14/24 0938  Weight: 257 lb (116.6 kg)  Height: 5' 8.5" (1.74 m)   Body mass index is 38.51 kg/m.     03/14/2024    9:44 AM 03/11/2023    6:55 PM 03/11/2023   10:03 AM 03/05/2023    8:53 AM 02/17/2022    8:20 AM 04/15/2021   11:51 AM 04/05/2021    8:05 AM  Advanced Directives  Does Patient Have a Medical Advance Directive? Yes Yes Yes Yes Yes Yes Yes  Type of Estate agent of [place];Living will Living will;Healthcare Power of [employer] Power of [place];Living will Healthcare Power of [place];Living will Healthcare Power of [place];Living will Healthcare Power of [place];Living will Healthcare Power of [place];Living will  Does patient want to  make changes to medical advance directive? No - Patient declined No - Patient declined No - Patient declined   No - Patient declined   Copy of Healthcare Power of Attorney in Chart? Yes - validated most recent copy scanned in chart (See row information) No - copy requested No - copy requested  No - copy requested No - copy requested Yes - validated most recent copy scanned in chart (See row information)  Would patient like information on creating a medical advance directive?      No - Patient declined     Current Medications (verified) Outpatient Encounter Medications as of 03/14/2024  Medication Sig   aspirin 81 MG chewable tablet Chew 1 tablet (81 mg total) by mouth daily.   atorvastatin (LIPITOR) 80 MG tablet Take 1 tablet (80 mg total) by mouth daily.   azaTHIOprine (IMURAN) 50 MG tablet Take 2 tablets (100 mg total) by mouth daily.   budesonide (ENTOCORT EC) 3 MG 24 hr capsule Take 2 capsules (6 mg total) by mouth daily.   clopidogrel (PLAVIX) 75 MG tablet TAKE 1 TABLET BY MOUTH EVERY DAY   docusate sodium (COLACE) 100 MG capsule Take 100 mg by mouth daily.   finasteride (PROSCAR) 5 MG tablet Take 5 mg by mouth daily.   levothyroxine (SYNTHROID) 137 MCG tablet Take 1 tablet (137 mcg total) by mouth daily before breakfast.   metFORMIN (GLUCOPHAGE) 500 MG tablet Take 2 tablets (1,000 mg total) by mouth daily with breakfast.   metoprolol succinate (TOPROL-XL) 25 MG 24 hr tablet TAKE 1 TABLET (25 MG TOTAL) BY MOUTH DAILY.  nitroGLYCERIN (NITROSTAT) 0.4 MG SL tablet Place 1 tablet (0.4 mg total) under the tongue every 5 (five) minutes as needed for chest pain.   Semaglutide-Weight Management (WEGOVY) 0.25 MG/0.5ML SOAJ Inject 0.25 mg into the skin once a week.   telmisartan (MICARDIS) 80 MG tablet TAKE 1 TABLET BY MOUTH EVERY DAY   No facility-administered encounter medications on file as of 03/14/2024.    Allergies (verified) Bactrim [sulfamethoxazole-trimethoprim]   History: Past  Medical History:  Diagnosis Date   Back pain    BPH (benign prostatic hypertrophy)    Chest pain    COLONIC POLYPS, HX OF 2007   clear colo w/o polyps 04/2015: 31yr follow up   Constipation    Coronary artery disease    Decreased mobility    due to BL knee and hip replacement   Fatigue    GERD (gastroesophageal reflux disease)    Heartburn    History of kidney stones    passed   Hyperlipidemia    HYPERTENSION    HYPOTHYROIDISM    Joint pain    Myocardial infarction (HCC) 12/2018   Inferior STEMI   OA (osteoarthritis)    Knees   OBESITY    OSA treated with BiPAP    PAF (paroxysmal atrial fibrillation) (HCC)    Pneumonia    Pre-diabetes    Shortness of breath on exertion    Umbilical hernia    Ventral hernia    Past Surgical History:  Procedure Laterality Date   arthroscopic knee surgery     (R) 2003 & (L) 2010   CARDIAC CATHETERIZATION     COLONOSCOPY  04/2015   no polyps (Pyrtle)   CORONARY/GRAFT ACUTE MI REVASCULARIZATION N/A 12/30/2018   Procedure: Coronary/Graft Acute MI Revascularization;  Surgeon: Lyn Records, MD;  Location: [facility];  Service: Cardiovascular;  Laterality: N/A;   HIP CLOSED REDUCTION Right 03/12/2021   Procedure: CLOSED REDUCTION HIP;  Surgeon: Venita Lick, MD;  Location: [facility];  Service: Orthopedics;  Laterality: Right;   LAMINECTOMY  1991   LEFT HEART CATH AND CORONARY ANGIOGRAPHY N/A 12/30/2018   Procedure: LEFT HEART CATH AND CORONARY ANGIOGRAPHY;  Surgeon: Lyn Records, MD;  Location: [facility];  Service: Cardiovascular;  Laterality: N/A;   TONSILLECTOMY  1990   w/ adenoids and uvula   TONSILLECTOMY     TOTAL HIP ARTHROPLASTY Right 01/2011   [place]   TOTAL HIP ARTHROPLASTY Left 04/16/2016   Procedure: TOTAL HIP ARTHROPLASTY ANTERIOR APPROACH;  Surgeon: Ollen Gross, MD;  Location: [facility];  Service: Orthopedics;  Laterality: Left;   TOTAL HIP REVISION Right 01/04/2020   Procedure: Right hip bearing surface;   Surgeon: Ollen Gross, MD;  Location: [facility];  Service: Orthopedics;  Laterality: Right;    TOTAL HIP REVISION Right 04/15/2021   Procedure: Right hip bearing surface revision;  Surgeon: Ollen Gross, MD;  Location: [facility];  Service: Orthopedics;  Laterality: Right;    TOTAL KNEE ARTHROPLASTY  12/27/2012   Procedure: TOTAL KNEE BILATERAL;  Surgeon: Loanne Drilling, MD;  Location: [facility];  Service: Orthopedics;  Laterality: Bilateral;   UMBILICAL HERNIA REPAIR N/A 03/11/2023   Procedure: HERNIA REPAIR UMBILICAL ADULT;  Surgeon: Sebastian Ache, MD;  Location: [facility];  Service: Urology;  Laterality: N/A;   WISDOM TOOTH EXTRACTION     XI ROBOTIC ASSISTED SIMPLE PROSTATECTOMY N/A 03/11/2023   Procedure: XI ROBOTIC ASSISTED SIMPLE PROSTATECTOMY;  Surgeon: Sebastian Ache, MD;  Location: [facility];  Service: Urology;  Laterality: N/A;  3 HRS   Family History  Problem Relation Age of Onset   Dementia Mother        ?ALS vs SNP   Heart disease Father 63       CABG age 77   Hypertension Father    Diabetes Father    Hyperlipidemia Father    Melanoma Father    Pancreatic cancer Sister    ALS Maternal Grandfather    Arthritis Other        Parent & grandparents   Colon cancer Neg Hx    Stomach cancer Neg Hx    Esophageal cancer Neg Hx    Social History   Socioeconomic History   Marital status: Married    Spouse name: Breyson Kelm   Number of children: Not on file   Years of education: Not on file   Highest education level: Some college, no degree  Occupational History   Occupation: Retired  Tobacco Use   Smoking status: Never   Smokeless tobacco: Never  Vaping Use   Vaping status: Never Used  Substance and Sexual Activity   Alcohol use: Yes    Comment: rarely, social   Drug use: No   Sexual activity: Not on file  Other Topics Concern   Not on file  Social History Narrative   Working part time, contemplating retirement end of 2016   Lives at home with spouse/2025       Exercise: water exercises   Social Drivers of Health   Financial Resource Strain: Low Risk  (03/10/2024)   Overall Financial Resource Strain (CARDIA)    Difficulty of Paying Living Expenses: Not hard at all  Food Insecurity: No Food Insecurity (03/10/2024)   Hunger Vital Sign    Worried About Running Out of Food in the Last Year: Never true    Ran Out of Food in the Last Year: Never true  Transportation Needs: No Transportation Needs (03/10/2024)   PRAPARE - Administrator, Civil Service (Medical): No    Lack of Transportation (Non-Medical): No  Physical Activity: Sufficiently Active (03/10/2024)   Exercise Vital Sign    Days of Exercise per Week: 5 days    Minutes of Exercise per Session: 60 min  Stress: No Stress Concern Present (03/10/2024)   [employer] of Occupational Health - Occupational Stress Questionnaire    Feeling of Stress : Only a little  Social Connections: Socially Integrated (03/10/2024)   Social Connection and Isolation Panel [NHANES]    Frequency of Communication with Friends and Family: More than three times a week    Frequency of Social Gatherings with Friends and Family: Once a week    Attends Religious Services: More than 4 times per year    Active Member of [employer] or Organizations: Yes    Attends Engineer, structural: More than 4 times per year    Marital Status: Married    Tobacco Counseling Counseling given: Not Answered    Clinical Intake:  Pre-visit preparation completed: Yes  Pain : No/denies pain     BMI - recorded: 38.51 Nutritional Status: BMI > 30  Obese Nutritional Risks: None Diabetes: No  How often do you need to have someone help you when you read instructions, pamphlets, or other written materials from your doctor or pharmacy?: 1 - Never     Information entered by :: Eliodoro Gullett, RMA  Activities of Daily Living     03/14/2024    9:42 AM  In your present state of health, do you have any difficulty  performing the following activities:  Hearing? 0  Vision? 0  Difficulty concentrating or making decisions? 0  Walking or climbing stairs? 0  Dressing or bathing? 0  Doing errands, shopping? 0  Preparing Food and eating ? N  Using the Toilet? N  In the past six months, have you accidently leaked urine? N  Do you have problems with loss of bowel control? N  Managing your Medications? N  Managing your Finances? N  Housekeeping or managing your Housekeeping? N    Patient Care Team: Pincus Sanes, MD as PCP - General (Internal Medicine) Quintella Reichert, MD as PCP - Cardiology (Cardiology) Ollen Gross, MD as Consulting Physician (Orthopedic Surgery) Jethro Bolus, MD (Inactive) (Urology) Pyrtle, Carie Caddy, MD as Consulting Physician (Gastroenterology) Kathyrn Sheriff, [facility] (Inactive) as Pharmacist (Pharmacist) Manning Charity, OD as Referring Physician (Optometry)  Indicate any recent Medical Services you may have received from other than [facility] providers in the past year (date may be approximate).     Assessment:   This is a routine wellness examination for [facility].  Hearing/Vision screen Hearing Screening - Comments:: Denies hearing difficulties   Vision Screening - Comments:: Wears eyeglasses   Goals Addressed             This Visit's Progress    Patient Stated   On track    To maintain my current health status by continuing to eat healthy, stay physically and socially. Also I plan to continue swimming 4-5 days out of the week for 45 minutes-1 hours.       Depression Screen     03/14/2024    9:48 AM 06/17/2023   10:34 AM 11/28/2022    8:35 AM 02/17/2022    8:22 AM 02/17/2022    8:19 AM 01/29/2021    1:41 PM 01/01/2021    8:29 AM  PHQ 2/9 Scores  PHQ - 2 Score 0 0 0 0 0 0 3  PHQ- 9 Score 1      15    Fall Risk     03/14/2024    9:44 AM 06/17/2023   10:34 AM 11/28/2022    8:35 AM 02/17/2022    8:21 AM 01/29/2021    1:42 PM  Fall Risk   Falls in the past year?  0 0 0 0 0  Number falls in past yr: 0 0 0 0 0  Injury with Fall? 0 0 0 0 0  Risk for fall due to : No Fall Risks No Fall Risks No Fall Risks  No Fall Risks  Follow up Falls prevention discussed;Falls evaluation completed Falls evaluation completed Falls evaluation completed Falls evaluation completed Falls evaluation completed    MEDICARE RISK AT HOME: Medicare Risk at Home Any stairs in or around the home?: Yes (2 steps at front door) If so, are there any without handrails?: Yes Home free of loose throw rugs in walkways, pet beds, electrical cords, etc?: Yes Adequate lighting in your home to reduce risk of falls?: Yes Life alert?: No Use of a cane, walker or w/c?: No Grab bars in the bathroom?: Yes Shower chair or bench in shower?: Yes Elevated toilet seat or a handicapped toilet?: Yes  TIMED UP AND GO:  Was the test performed?  No  Cognitive Function: 6CIT completed        03/14/2024    9:45 AM  6CIT Screen  What Year? 0 points  What month?  0 points  What time? 0 points  Count back from 20 0 points  Months in reverse 0 points  Repeat phrase 0 points  Total Score 0 points    Immunizations Immunization History  Administered Date(s) Administered   Fluad Quad(high Dose 65+) 09/30/2019, 10/10/2020, 10/28/2022   Influenza, High Dose Seasonal PF 10/04/2021   Influenza,inj,Quad PF,6+ Mos 10/15/2017   Influenza,inj,quad, With Preservative 09/28/2018   Influenza-Unspecified 09/28/2012, 09/28/2013, 09/28/2017   PFIZER Comirnaty(Gray Top)Covid-19 Tri-Sucrose Vaccine 03/28/2021   PFIZER(Purple Top)SARS-COV-2 Vaccination 01/18/2020, 02/08/2020, 09/11/2020   Pfizer Covid-19 Vaccine Bivalent Booster 68yrs & up 10/04/2021   Pfizer(Comirnaty)Fall Seasonal Vaccine 12 years and older 10/28/2022, 10/18/2023   Pneumococcal Conjugate-13 08/19/2018   Pneumococcal Polysaccharide-23 11/03/2019   Respiratory Syncytial Virus Vaccine,Recomb Aduvanted(Arexvy) 11/11/2022   Td 09/04/2009   Td  (Adult), 2 Lf Tetanus Toxid, Preservative Free 09/04/2009   Td,absorbed, Preservative Free, Adult Use, Lf Unspecified 03/28/2021   Tdap 03/28/2021   Zoster Recombinant(Shingrix) 01/20/2018, 03/22/2018   Zoster, Live 08/12/2013    Screening Tests Health Maintenance  Topic Date Due   COVID-19 Vaccine (8 - Pfizer risk 2024-25 season) 04/17/2024   Medicare Annual Wellness (AWV)  03/14/2025   Colonoscopy  04/29/2025   DTaP/Tdap/Td (5 - Td or Tdap) 03/29/2031   Pneumonia Vaccine 40+ Years old  Completed   INFLUENZA VACCINE  Completed   Hepatitis C Screening  Completed   Zoster Vaccines- Shingrix  Completed   HPV VACCINES  Aged Out    Health Maintenance  There are no preventive care reminders to display for this patient.  Health Maintenance Items Addressed: See Nurse Notes  Additional Screening:  Vision Screening: Recommended annual ophthalmology exams for early detection of glaucoma and other disorders of the eye.  Dental Screening: Recommended annual dental exams for proper oral hygiene  Community Resource Referral / Chronic Care Management: CRR required this visit?  No   CCM required this visit?  No     Plan:     I have personally reviewed and noted the following in the patient's chart:   Medical and social history Use of alcohol, tobacco or illicit drugs  Current medications and supplements including opioid prescriptions. Patient is not currently taking opioid prescriptions. Functional ability and status Nutritional status Physical activity Advanced directives List of other physicians Hospitalizations, surgeries, and ER visits in previous 12 months Vitals Screenings to include cognitive, depression, and falls Referrals and appointments  In addition, I have reviewed and discussed with patient certain preventive protocols, quality metrics, and best practice recommendations. A written personalized care plan for preventive services as well as general preventive  health recommendations were provided to patient.     Gerhard Rappaport L Toccara Alford, CMA   03/14/2024   After Visit Summary: (MyChart) Due to this being a telephonic visit, the after visit summary with patients personalized plan was offered to patient via MyChart   Notes: Nothing significant to report at this time.

## 2024-03-14 NOTE — Patient Instructions (Signed)
 [name] , Thank you for taking time to come for your Medicare Wellness Visit. I appreciate your ongoing commitment to your health goals. Please review the following plan we discussed and let me know if I can assist you in the future.   Referrals/Orders/Follow-Ups/Clinician Recommendations: It was nice to speak with you today.  Keep up the good work.  This is a list of the screening recommended for you and due dates:  Health Maintenance  Topic Date Due   COVID-19 Vaccine (8 - Pfizer risk 2024-25 season) 04/17/2024   Medicare Annual Wellness Visit  03/14/2025   Colon Cancer Screening  04/29/2025   DTaP/Tdap/Td vaccine (5 - Td or Tdap) 03/29/2031   Pneumonia Vaccine  Completed   Flu Shot  Completed   Hepatitis C Screening  Completed   Zoster (Shingles) Vaccine  Completed   HPV Vaccine  Aged Out    Advanced directives: (In Chart) A copy of your advanced directives are scanned into your chart should your provider ever need it.  Next Medicare Annual Wellness Visit scheduled for next year: Yes

## 2024-03-16 ENCOUNTER — Encounter (INDEPENDENT_AMBULATORY_CARE_PROVIDER_SITE_OTHER): Payer: Self-pay | Admitting: Adult Health

## 2024-03-16 ENCOUNTER — Ambulatory Visit (INDEPENDENT_AMBULATORY_CARE_PROVIDER_SITE_OTHER): Payer: Medicare Other | Admitting: Adult Health

## 2024-03-16 VITALS — BP 128/79 | HR 61 | Temp 98.3°F | Ht 71.0 in | Wt 263.0 lb

## 2024-03-16 DIAGNOSIS — E669 Obesity, unspecified: Secondary | ICD-10-CM | POA: Diagnosis not present

## 2024-03-16 DIAGNOSIS — E559 Vitamin D deficiency, unspecified: Secondary | ICD-10-CM | POA: Diagnosis not present

## 2024-03-16 DIAGNOSIS — K754 Autoimmune hepatitis: Secondary | ICD-10-CM | POA: Diagnosis not present

## 2024-03-16 DIAGNOSIS — R0602 Shortness of breath: Secondary | ICD-10-CM

## 2024-03-16 DIAGNOSIS — R7303 Prediabetes: Secondary | ICD-10-CM

## 2024-03-16 DIAGNOSIS — I10 Essential (primary) hypertension: Secondary | ICD-10-CM | POA: Diagnosis not present

## 2024-03-16 DIAGNOSIS — E66812 Obesity, class 2: Secondary | ICD-10-CM

## 2024-03-16 DIAGNOSIS — E612 Magnesium deficiency: Secondary | ICD-10-CM | POA: Diagnosis not present

## 2024-03-16 DIAGNOSIS — Z6836 Body mass index (BMI) 36.0-36.9, adult: Secondary | ICD-10-CM

## 2024-03-16 MED ORDER — METFORMIN HCL 500 MG PO TABS: 1000.0000 mg | ORAL_TABLET | Freq: Every day | ORAL | 0 refills | Status: DC

## 2024-03-16 MED ORDER — ZEPBOUND 2.5 MG/0.5ML ~~LOC~~ SOAJ: 2.5000 mg | SUBCUTANEOUS | 0 refills | Status: DC

## 2024-03-16 NOTE — Progress Notes (Signed)
 WEIGHT SUMMARY AND BIOMETRICS  Vitals Temp: 98.3 F (36.8 C) BP: 128/79 Pulse Rate: 61 SpO2: 95 %   Anthropometric Measurements Height: 5\' 11"  (1.803 m) Weight: 263 lb (119.3 kg) BMI (Calculated): 36.7 Weight at Last Visit: 257 lb Weight Lost Since Last Visit: 0 lb Weight Gained Since Last Visit: 6 lb Starting Weight: 266 lb Total Weight Loss (lbs): 3 lb (1.361 kg)   Body Composition  Body Fat %: 36.3 % Fat Mass (lbs): 95.4 lbs Muscle Mass (lbs): 159.4 lbs Total Body Water (lbs): 116 lbs Visceral Fat Rating : 23   Other Clinical Data RMR: 2146 Fasting: yes Labs: yes Today's Visit #: 26 Starting Date: 01/01/21    Chief Complaint:   OBESITY [name] is here to discuss his progress with his obesity treatment plan.  He is on the the Category 3 Plan and states he is following his eating plan approximately 50 % of the time.  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   Interim History:  Reviewed Bioimpedance results with pt: Muscle Mass: +1.8 lbs Adipose Mass: +3.8 lbs Have attempted mulitple prescriptions for GLP-1 therapy- all denied by insurance.  With his hx of HTN, Obesity, HLD, Hx of MI, NAFLD- strongly recommend GLP-1 or GIP/GLP-1 therapy-  Discussed ESSENCE Trial-  Semaglutide: ESSENCE is a phase 3 trial evaluating the effect of once-weekly subcutaneous semaglutide 2.4 mg in adults with MASH and F2-F3 fibrosis. ESSENCE is a two-part trial with participants randomized 2:1 to receive semaglutide 2.4 mg or placebo, on top of standard of care for 240 weeks. In part 1, the objective was to demonstrate that treatment with semaglutide improves liver histology at 72 weeks based on biopsy sampling; in part 2, the objective is to demonstrate that treatment with semaglutide lowers the risk of liver-related clinical events compared to placebo at 240 weeks. Results from part 1 of the ongoing ESSENCE trial achieved its  primary endpoints by demonstrating a statistically significant and superior improvement in liver fibrosis with no worsening of steatohepatitis, as well as resolution of steatohepatitis with no worsening of liver fibrosis with semaglutide 2.4 mg compared to placebo. At week 72, 37.0% of people treated with semaglutide 2.4 mg achieved improvement in liver fibrosis with no worsening of steatohepatitis compared to 22.5% on placebo2. 62.9% of people treated with semaglutide 2.4 mg achieved resolution of steatohepatitis with no worsening of liver fibrosis compared to 34.1% on placebo. [employer] Release 10/30/2023, data presented at [employer] 10/2023.  Tirzepatide: Data was published in July 2024 of a Phase 2 multicenter, dose-finding, double-blind, placebo-controlled, randomized controlled trial (RCT) evaluating if tirzepatide will decrease fibrosis in and resolve metabolic-dysfunction associated steatohepatitis (MASH). Primary endpoint was resolution of MASH without worsening of fibrosis at week 52. Outcomes were assessed by central, independent review by 2 pathologists. Participants randomized 1:1:1:1 to 1 of 4 study arms: 1) tirzepatide 5 mg once weekly; 2) tirzepatide 10 mg once weekly; 3) tirzepatide 15 mg once weekly; or 4) placebo once weekly, all administered subcutaneously for 52 weeks. Tirzepatide starting dose was 2.5 mg once weekly and increased by 2.5 mg every 4 weeks until target dose attained (based on assigned study arm). At week 52, MASH resolution without worsening fibrosis was achieved in 44% of participants in the tirzepatide 5 mg arm, 56% in the tirzepatide 10 mg arm and 62% in the tirzepatide 15 mg arm compared to 10% in the placebo arm. The percentage of  participants who had an improvement of at least one fibrosis stage without worsening of MASH was 30% in the placebo group, 55% in the 5-mg tirzepatide group (difference vs. placebo, 25 percentage points; 95% CI, 5 to 46), 51% in  the 10-mg tirzepatide group (difference, 22 percentage points; 95% CI, 1 to 42), and 51% in the 15-mg tirzepatide group (difference, 21 percentage points; 95% CI, 1 to 42). The most common adverse events in the tirzepatide groups were gastrointestinal events, and most were mild or moderate in severity. Ivor Costa ML, Lawitz EJ. et al. Tirzepatide for metabolic-dysfunction associated steatohepatitis with liver fibrosis. NEJM 2024 Jun 8. Online ahead of print.  Summary of GLP-1s trials:  Semagluitde (phase 3): at 72-- 62.9% of patients in semaglutide had resolution of MASH compared to 34% on placebo; 37% had improvement by at least one stage of fibrosis compared to 22% on placebo Tirzepatide (phase 2): at 52 weeks 44% - 62% of patients in tirzepatide had resolution of MASH compared to 10% on placebo; 51%-55% had improvement by at least one stage of fibrosis compared to 30% on placebo   He denies family hx of MENS 2 or MTC He denies personal hx of pancreatitis   Subjective:   1. SOB (shortness of breath) on exertion He endorses dyspnea with extreme exertion, denies CP   01/01/21 08:00  RMR 1930  01/01/2021 RMR slower than expected   03/16/24 09:00  RMR 2146   03/16/2024 RMR increased, however still slower than expected  2. Autoimmune hepatitis [facility])  [facility]  03/07/2024 OV NOTES Subjective  [name] is a 73 y.o. White or Caucasian male who is accompanied by wife, [name]. Patient serves as his own historian. [name] was last seen by me at [facility] on 10/30/2023 and is here today for follow-up.  Patient with AIH.  Previous serologic evaluation notable for a strongly positive ANA and ASMA, negative for hepatitis B and C with no immunity to hepatitis B. No testing for previous hepatitis B exposure or immunity to hepatitis A has been conducted.  Patient underwent liver biopsy on 08/05/2023 showing  plasma cells and eosinophils with no interface hepatitis and reported as autoimmune hepatitis versus drug-induced liver injury.  In December 2021 patient had elevated aminotransferases with AST 177 and ALT 305 these had improved to normal limits by April 2022. He had evidence of steatosis on ultrasound in 2021. In June 2024 he again presented with elevated liver chemistries with AST 118 and ALT 181, his liver chemistries returned to normal limits by August 2024 prior to starting intervention. He has had intermittent aminotransferase elevation <2 times upper limits of normal since February 2021. Patient had undergone prostate surgery in March 2024. He received a short course of Ancef and Bactrim at the time of his prostatectomy and was treated with Bactrim again in July 2024 which resulted in a reaction that caused the patient's significant mouth pain. He also started finasteride in February 2024. We had planned to discontinue his budesonide however as it appeared it was more likely that he had a drug-induced liver injury however he had repeat elevation of his liver chemistries at the time of his initial visit. Assessment / Plan:   Problem List Items Addressed This Visit   Metabolic dysfunction-associated steatotic liver disease (MASLD)  Patient with evidence of hepatic steatosis on ultrasound but not confirmed on biopsy.  MASH risk factors include:  obesity (BMI >  25) Pre-type 2 diabetes hypertension (blood pressure >130/85 or on antihypertensive drugs) hyperlipidemia (HDL <40 mg/dL for male or <62 mg/dL for male OR on lipid-lowering agent)  Patient with possible metabolic dysfunction associated steatotic liver disease (MASLD)   MASH is a diagnosis of exclusion; serologic evaluation for other etiologies of liver disease raises concern for autoimmune hepatitis  Most recent fibrosis staging with F1 fibrosis by biopsy  Based on revised original score for autoimmune hepatitis diagnosis of  autoimmune hepatitis is possible but not definitive.  Pathology question drug-induced liver injury as there were significant eosinophils on biopsy. It is possible that he may have had a drug-induced liver injury as he did receive a Bactrim and Ancef in March 2024 however his second course of Bactrim was in July and his liver chemistries had already been downtrending. Finasteride has not been associated with drug-induced liver injury.   He had rise in LFT with decrease in immunosuppression suggesting he does indeed have AIH.  Because of his previous elevations and the presence of lymphoplasmacytic hepatitis I discussed with the patient and his wife that we can plan to continue immunosuppression and as long as his liver chemistries remain within normal limits in 2 years we can attempt to discontinue immunosuppression. This would prevent flare if there is potentially underlying autoimmune liver disease.  Patient is now on aza 100 mg daily and budesonide 3 mg daily. Will plan to continue budesonide 3 mg daily for a total of 1 month, repeat labs at that time, and if stable plan to discontinue budesonide.   3. Pre-diabetes Lab Results  Component Value Date   HGBA1C 6.2 06/17/2023   HGBA1C 5.9 (H) 02/24/2023   HGBA1C 5.7 (H) 10/23/2022     4. Essential hypertension BP stable at OV He is able to exercise at high intensity without CP He is currently on metoprolol succinate (TOPROL-XL) 25 MG 24 hr tablet  aspirin 81 MG chewable tablet  nitroGLYCERIN (NITROSTAT) 0.4 MG SL tablet  atorvastatin (LIPITOR) 80 MG tablet  telmisartan (MICARDIS) 80 MG tablet   5. Vitamin D deficiency  Latest Reference Range & Units 08/14/23 10:16  VITD 30.00 - 100.00 ng/mL 61.69   He endorses stable energy levels  Assessment/Plan:   1. SOB (shortness of breath) on exertion (Primary) Convert from Cat 3 to Cat 4 Meal Plan  2. Autoimmune hepatitis (HCC) Check Labs - Comprehensive metabolic panel  ESSENCE  TRIAL Tirzepatide: Data was published in July 2024 of a Phase 2 multicenter, dose-finding, double-blind, placebo-controlled, randomized controlled trial (RCT) evaluating if tirzepatide will decrease fibrosis in and resolve metabolic-dysfunction associated steatohepatitis (MASH). Primary endpoint was resolution of MASH without worsening of fibrosis at week 52. Outcomes were assessed by central, independent review by 2 pathologists. Participants randomized 1:1:1:1 to 1 of 4 study arms: 1) tirzepatide 5 mg once weekly; 2) tirzepatide 10 mg once weekly; 3) tirzepatide 15 mg once weekly; or 4) placebo once weekly, all administered subcutaneously for 52 weeks. Tirzepatide starting dose was 2.5 mg once weekly and increased by 2.5 mg every 4 weeks until target dose attained (based on assigned study arm). At week 52, MASH resolution without worsening fibrosis was achieved in 44% of participants in the tirzepatide 5 mg arm, 56% in the tirzepatide 10 mg arm and 62% in the tirzepatide 15 mg arm compared to 10% in the placebo arm. The percentage of participants who had an improvement of at least one fibrosis stage without worsening of MASH was 30% in the placebo group, 55%  in the 5-mg tirzepatide group (difference vs. placebo, 25 percentage points; 95% CI, 5 to 46), 51% in the 10-mg tirzepatide group (difference, 22 percentage points; 95% CI, 1 to 42), and 51% in the 15-mg tirzepatide group (difference, 21 percentage points; 95% CI, 1 to 42). The most common adverse events in the tirzepatide groups were gastrointestinal events, and most were mild or moderate in severity. Ivor Costa ML, Lawitz EJ. et al. Tirzepatide for metabolic-dysfunction associated steatohepatitis with liver fibrosis. NEJM 2024 Jun 8. Online ahead of print.  Summary of GLP-1s trials:  Semagluitde (phase 3): at 72-- 62.9% of patients in semaglutide had resolution of MASH compared to 34% on placebo; 37% had improvement by at least one stage of  fibrosis compared to 22% on placebo Tirzepatide (phase 2): at 52 weeks 44% - 62% of patients in tirzepatide had resolution of MASH compared to 10% on placebo; 51%-55% had improvement by at least one stage of fibrosis compared to 30% on placebo   Start  tirzepatide (ZEPBOUND) 2.5 MG/0.5ML Pen Inject 2.5 mg into the skin once a week. Dispense: 3 mL, Refills: 0 ordered   3. Pre-diabetes Check Labs - Hemoglobin A1c - Insulin, random - Vitamin B12 -Mg++  4. Essential hypertension Continue regular exercise and  metoprolol succinate (TOPROL-XL) 25 MG 24 hr tablet  aspirin 81 MG chewable tablet  nitroGLYCERIN (NITROSTAT) 0.4 MG SL tablet  atorvastatin (LIPITOR) 80 MG tablet  telmisartan (MICARDIS) 80 MG tablet   5. Vitamin D deficiency Check Labs - VITAMIN D 25 Hydroxy (Vit-D Deficiency, Fractures)  6. Obesity, Current BMI 36.7 If Zepbound denied- pt provided information on how to begin an appeal  [place] is currently in the action stage of change. As such, his goal is to continue with weight loss efforts. He has agreed to the Category 4 Plan.   Exercise goals: Older adults should follow the adult guidelines. When older adults cannot meet the adult guidelines, they should be as physically active as their abilities and conditions will allow.  Older adults should do exercises that maintain or improve balance if they are at risk of falling.  Older adults should determine their level of effort for physical activity relative to their level of fitness.  Older adults with chronic conditions should understand whether and how their conditions affect their ability to do regular physical activity safely.  Behavioral modification strategies: increasing lean protein intake, decreasing simple carbohydrates, increasing vegetables, increasing water intake, no skipping meals, meal planning and cooking strategies, keeping healthy foods in the home, ways to avoid boredom eating, and planning for  success.  Dario has agreed to follow-up with our clinic in 4 weeks. He was informed of the importance of frequent follow-up visits to maximize his success with intensive lifestyle modifications for his multiple health conditions.   Nomar was informed we would discuss his lab results at his next visit unless there is a critical issue that needs to be addressed sooner. Elan agreed to keep his next visit at the agreed upon time to discuss these results.  Objective:   Blood pressure 128/79, pulse 61, temperature 98.3 F (36.8 C), height 5\' 11"  (1.803 m), weight 263 lb (119.3 kg), SpO2 95%. Body mass index is 36.68 kg/m.  General: Cooperative, alert, well developed, in no acute distress. HEENT: Conjunctivae and lids unremarkable. Cardiovascular: Regular rhythm.  Lungs: Normal work of breathing. Neurologic: No focal deficits.   Lab Results  Component Value Date   CREATININE 0.77 08/05/2023   BUN 15 08/05/2023  NA 137 08/05/2023   K 4.2 08/05/2023   CL 107 08/05/2023   CO2 22 08/05/2023   Lab Results  Component Value Date   ALT 18 10/16/2023   AST 22 10/16/2023   ALKPHOS 59 10/16/2023   BILITOT 0.6 10/16/2023   Lab Results  Component Value Date   HGBA1C 6.2 06/17/2023   HGBA1C 5.9 (H) 02/24/2023   HGBA1C 5.7 (H) 10/23/2022   HGBA1C 5.8 (H) 06/09/2022   HGBA1C 5.5 01/09/2022   Lab Results  Component Value Date   INSULIN 15.6 02/24/2023   INSULIN 19.4 10/23/2022   INSULIN 15.2 06/09/2022   INSULIN 8.4 01/09/2022   INSULIN 22.7 08/19/2021   Lab Results  Component Value Date   TSH 0.65 06/17/2023   Lab Results  Component Value Date   CHOL 123 06/17/2023   HDL 42.90 06/17/2023   LDLCALC 65 06/17/2023   LDLDIRECT 153.1 06/09/2011   TRIG 75.0 06/17/2023   CHOLHDL 3 06/17/2023   Lab Results  Component Value Date   VD25OH 61.69 08/14/2023   VD25OH 46.8 02/24/2023   VD25OH 60.2 10/23/2022   Lab Results  Component Value Date   WBC 5.0 09/14/2023   HGB  13.3 09/14/2023   HCT 41.0 09/14/2023   MCV 85.4 09/14/2023   PLT 135.0 (L) 09/14/2023   Lab Results  Component Value Date   FERRITIN 20.6 (L) 07/21/2023   Attestation Statements:   Reviewed by clinician on day of visit: allergies, medications, problem list, medical history, surgical history, family history, social history, and previous encounter notes.  I have reviewed the above documentation for accuracy and completeness, and I agree with the above. -  Lavaun Greenfield d. Elara Cocke, NP-C

## 2024-03-17 LAB — MAGNESIUM: Magnesium: 2 mg/dL (ref 1.6–2.3)

## 2024-03-18 LAB — INSULIN, RANDOM: INSULIN: 16 u[IU]/mL (ref 2.6–24.9)

## 2024-03-18 LAB — COMPREHENSIVE METABOLIC PANEL
ALT: 19 IU/L (ref 0–44)
AST: 23 IU/L (ref 0–40)
Albumin: 4.4 g/dL (ref 3.9–4.9)
Alkaline Phosphatase: 78 IU/L (ref 44–121)
BUN/Creatinine Ratio: 16 (ref 10–24)
BUN: 14 mg/dL (ref 8–27)
Bilirubin Total: 0.4 mg/dL (ref 0.0–1.2)
CO2: 20 mmol/L (ref 20–29)
Calcium: 9.3 mg/dL (ref 8.6–10.2)
Chloride: 107 mmol/L — ABNORMAL HIGH (ref 96–106)
Creatinine, Ser: 0.9 mg/dL (ref 0.76–1.27)
Globulin, Total: 2.3 g/dL (ref 1.5–4.5)
Glucose: 102 mg/dL — ABNORMAL HIGH (ref 70–99)
Potassium: 4.8 mmol/L (ref 3.5–5.2)
Sodium: 143 mmol/L (ref 134–144)
Total Protein: 6.7 g/dL (ref 6.0–8.5)
eGFR: 92 mL/min/{1.73_m2} (ref >59)

## 2024-03-18 LAB — VITAMIN D 25 HYDROXY (VIT D DEFICIENCY, FRACTURES): Vit D, 25-Hydroxy: 52 ng/mL (ref 30.0–100.0)

## 2024-03-18 LAB — HEMOGLOBIN A1C
Est. average glucose Bld gHb Est-mCnc: 120 mg/dL
Hgb A1c MFr Bld: 5.8 % — ABNORMAL HIGH (ref 4.8–5.6)

## 2024-03-18 LAB — VITAMIN B12: Vitamin B-12: 998 pg/mL (ref 232–1245)

## 2024-03-22 DIAGNOSIS — K760 Fatty (change of) liver, not elsewhere classified: Secondary | ICD-10-CM | POA: Diagnosis not present

## 2024-03-22 DIAGNOSIS — K754 Autoimmune hepatitis: Secondary | ICD-10-CM | POA: Diagnosis not present

## 2024-03-24 ENCOUNTER — Telehealth: Payer: Self-pay | Admitting: Cardiology

## 2024-03-24 MED ORDER — AMLODIPINE BESYLATE 5 MG PO TABS: 5.0000 mg | ORAL_TABLET | Freq: Every day | ORAL | 3 refills | Status: DC

## 2024-03-24 NOTE — Telephone Encounter (Signed)
 Called patient back about message and getting his HR readings. Patient stated his HR is in the 60's during the day, except for when he is active, Moving the grass or at the gym, then it can be around 110's. Patient stated when he is sleeping his HR is somewhere between 40 and 50. Will let Dr. Mayford Knife know.

## 2024-03-24 NOTE — Telephone Encounter (Signed)
 Quintella Reichert, MD to Me    03/24/24  2:10 PM Add amlodipine 5 mg daily and check blood pressure and heart rate twice daily for a week and call with results   Patient verbalized understanding.

## 2024-03-24 NOTE — Telephone Encounter (Signed)
  Per MyChart scheduling message:  Pt c/o BP issue: STAT if pt c/o blurred vision, one-sided weakness or slurred speech.  STAT if BP is GREATER than 180/120 TODAY.  STAT if BP is LESS than 90/60 and SYMPTOMATIC TODAY  1. What is your BP concern?   2. Have you taken any BP medication today?  3. What are your last 5 BP readings?  4. Are you having any other symptoms (ex. Dizziness, headache, blurred vision, passed out)?    My blood pressure has been going up over the last few months.   Last 5 readings. 03/23/24  147/80  hone. 03/16/24 146/83  home.      128/79 [facility] 03/11/24  163/82. home. 03/06/24 147/82 home 3/4 25  180/89 home   I am not feeling any of the symptoms listed.

## 2024-03-26 ENCOUNTER — Other Ambulatory Visit (INDEPENDENT_AMBULATORY_CARE_PROVIDER_SITE_OTHER): Payer: Self-pay | Admitting: Adult Health

## 2024-03-26 DIAGNOSIS — R7303 Prediabetes: Secondary | ICD-10-CM

## 2024-03-31 ENCOUNTER — Telehealth: Payer: Self-pay | Admitting: Cardiology

## 2024-03-31 DIAGNOSIS — N453 Epididymo-orchitis: Secondary | ICD-10-CM | POA: Diagnosis not present

## 2024-03-31 DIAGNOSIS — R8271 Bacteriuria: Secondary | ICD-10-CM | POA: Diagnosis not present

## 2024-03-31 NOTE — Telephone Encounter (Signed)
 Spoke with patient and he states below hs his BP after starting amlodipine. However he states when he is at the doctor its never as high as his home monitor shows

## 2024-03-31 NOTE — Telephone Encounter (Signed)
  Per MyChart scheduling message:  Patient was put on amlodipine per phone note 03/24/24  These are my BP readings since starting on Amlodipine.   3/28  AM 145/85  pulse 58. PM  148/81. 68 3/29  AM 149/83  55.  PM 136/79  60 3/30. AM 152/78  50.  PM159/82. 67 3/31  AM  149/84. 56   PM 148/84. 77 4/1.   AM  161/82. 54.  PM  150/85. 63 4/2   AM   163/81  50.   PM. 153/83  62     These we taken with my home BP monitor that always reads higher than when I have it checked at a Doctors office. When taken at the [facility] it averages about 127/78.   Thanks [employer]

## 2024-04-05 NOTE — Telephone Encounter (Signed)
 Spoke with patient and he is scheduled for a nurse visit

## 2024-04-08 ENCOUNTER — Ambulatory Visit: Attending: Cardiology

## 2024-04-08 VITALS — BP 128/76 | HR 71 | Wt 271.2 lb

## 2024-04-08 DIAGNOSIS — I10 Essential (primary) hypertension: Secondary | ICD-10-CM

## 2024-04-08 NOTE — Progress Notes (Signed)
   Nurse Visit   Date of Encounter: 04/08/2024 ID: [name], DOB June 03, 1951, MRN [id number]  PCP:  Pincus Sanes, MD   [facility] Providers Cardiologist:  Armanda Magic, MD      Visit Details   VS:  BP 128/76   Pulse 71   Wt 271 lb 3.2 oz (123 kg)   BMI 37.82 kg/m  , BMI Body mass index is 37.82 kg/m.  Wt Readings from Last 3 Encounters:  04/08/24 271 lb 3.2 oz (123 kg)  03/16/24 263 lb (119.3 kg)  03/14/24 257 lb (116.6 kg)     Reason for visit: BP check and compare to home monitoring machine Performed today: Vitals, Dr Jimmey Ralph, DOD consulted, patient education Changes (medications, testing, etc.) : none Length of Visit: 15 minutes    Medications Adjustments/Labs and Tests Ordered: No orders of the defined types were placed in this encounter.  No orders of the defined types were placed in this encounter.  Pt presented to day for BP check with comparison to his home machine as his BP appears to always be higher at home then in the office.  Manual BP of 128/76 today.  Pt's BP machine registers 144/86 with HR 71.  Encouraged pt to consider purchasing a new BP monitor for home use as he reports his current machine is older.   Signed, Alois Cliche, RN  04/08/2024 11:22 AM

## 2024-04-08 NOTE — Patient Instructions (Signed)
 Medication Instructions:  Your physician recommends that you continue on your current medications as directed. Please refer to the Current Medication list given to you today.  *If you need a refill on your cardiac medications before your next appointment, please call your pharmacy*  Lab Work: None ordered.  If you have labs (blood work) drawn today and your tests are completely normal, you will receive your results only by: MyChart Message (if you have MyChart) OR A paper copy in the mail If you have any lab test that is abnormal or we need to change your treatment, we will call you to review the results.  Testing/Procedures: None ordered.   Follow-Up: At [facility], you and your health needs are our priority.  As part of our continuing mission to provide you with exceptional heart care, our providers are all part of one team.  This team includes your primary Cardiologist (physician) and Advanced Practice Providers or APPs (Physician Assistants and Nurse Practitioners) who all work together to provide you with the care you need, when you need it.  Your next appointment:   As planned      1st Floor: - Lobby - Registration  - Pharmacy  - Lab - Cafe  2nd Floor: - PV Lab - Diagnostic Testing (echo, CT, nuclear med)  3rd Floor: - Vacant  4th Floor: - TCTS (cardiothoracic surgery) - AFib Clinic - Structural Heart Clinic - Vascular Surgery  - Vascular Ultrasound  5th Floor: - HeartCare Cardiology (general and EP) - Clinical Pharmacy for coumadin, hypertension, lipid, weight-loss medications, and med management appointments    Valet parking services will be available as well.

## 2024-04-21 DIAGNOSIS — N453 Epididymo-orchitis: Secondary | ICD-10-CM | POA: Diagnosis not present

## 2024-04-21 DIAGNOSIS — R338 Other retention of urine: Secondary | ICD-10-CM | POA: Diagnosis not present

## 2024-04-26 DIAGNOSIS — K754 Autoimmune hepatitis: Secondary | ICD-10-CM | POA: Diagnosis not present

## 2024-04-28 ENCOUNTER — Ambulatory Visit (INDEPENDENT_AMBULATORY_CARE_PROVIDER_SITE_OTHER): Admitting: Adult Health

## 2024-05-02 ENCOUNTER — Other Ambulatory Visit: Payer: Self-pay | Admitting: Cardiology

## 2024-05-09 ENCOUNTER — Other Ambulatory Visit (INDEPENDENT_AMBULATORY_CARE_PROVIDER_SITE_OTHER): Payer: Self-pay | Admitting: Adult Health

## 2024-05-09 DIAGNOSIS — R7303 Prediabetes: Secondary | ICD-10-CM

## 2024-05-17 ENCOUNTER — Ambulatory Visit (INDEPENDENT_AMBULATORY_CARE_PROVIDER_SITE_OTHER): Admitting: Adult Health

## 2024-05-17 ENCOUNTER — Encounter (INDEPENDENT_AMBULATORY_CARE_PROVIDER_SITE_OTHER): Payer: Self-pay

## 2024-05-17 ENCOUNTER — Telehealth (INDEPENDENT_AMBULATORY_CARE_PROVIDER_SITE_OTHER): Payer: Self-pay

## 2024-05-17 ENCOUNTER — Encounter (INDEPENDENT_AMBULATORY_CARE_PROVIDER_SITE_OTHER): Payer: Self-pay | Admitting: Adult Health

## 2024-05-17 VITALS — BP 112/73 | HR 61 | Temp 97.5°F | Ht 71.0 in | Wt 265.0 lb

## 2024-05-17 DIAGNOSIS — R7303 Prediabetes: Secondary | ICD-10-CM | POA: Diagnosis not present

## 2024-05-17 DIAGNOSIS — E669 Obesity, unspecified: Secondary | ICD-10-CM | POA: Diagnosis not present

## 2024-05-17 DIAGNOSIS — E66812 Obesity, class 2: Secondary | ICD-10-CM

## 2024-05-17 DIAGNOSIS — K754 Autoimmune hepatitis: Secondary | ICD-10-CM | POA: Diagnosis not present

## 2024-05-17 DIAGNOSIS — I213 ST elevation (STEMI) myocardial infarction of unspecified site: Secondary | ICD-10-CM | POA: Diagnosis not present

## 2024-05-17 DIAGNOSIS — Z6836 Body mass index (BMI) 36.0-36.9, adult: Secondary | ICD-10-CM | POA: Diagnosis not present

## 2024-05-17 DIAGNOSIS — G4733 Obstructive sleep apnea (adult) (pediatric): Secondary | ICD-10-CM | POA: Diagnosis not present

## 2024-05-17 MED ORDER — METFORMIN HCL 500 MG PO TABS: 1000.0000 mg | ORAL_TABLET | Freq: Every day | ORAL | 0 refills | Status: DC

## 2024-05-17 MED ORDER — ZEPBOUND 2.5 MG/0.5ML ~~LOC~~ SOAJ: 2.5000 mg | SUBCUTANEOUS | 0 refills | Status: DC

## 2024-05-17 MED ORDER — ONDANSETRON HCL 4 MG PO TABS: 4.0000 mg | ORAL_TABLET | Freq: Three times a day (TID) | ORAL | 0 refills | Status: AC | PRN

## 2024-05-17 NOTE — Progress Notes (Signed)
 WEIGHT SUMMARY AND BIOMETRICS  Vitals Temp: (!) 97.5 F (36.4 C) BP: 112/73 Pulse Rate: 61 SpO2: 96 %   Anthropometric Measurements Height: 5\' 11"  (1.803 m) Weight: 265 lb (120.2 kg) BMI (Calculated): 36.98 Weight at Last Visit: 263lb Weight Lost Since Last Visit: 0 Weight Gained Since Last Visit: 2lb Starting Weight: 266lb Total Weight Loss (lbs): 1 lb (0.454 kg)   Body Composition  Body Fat %: 37 % Fat Mass (lbs): 98.4 lbs Muscle Mass (lbs): 159.2 lbs Total Body Water  (lbs): 123 lbs Visceral Fat Rating : 24   Other Clinical Data Fasting: no Labs: no Today's Visit #: 27 Starting Date: 01/01/21    Chief Complaint:   OBESITY [name] is here to discuss his progress with his obesity treatment plan.  He is on the the Category 3 Plan and states he is following his eating plan approximately 50 % of the time.  He states he is exercising Swimming/Strength Training 60+ minutes 5/2 times per week.  Interim History:  Due to improving labs and lack of acute liver sx's, his Liver Specialist Recommended: Stop budesonide  approximately 03/22/2024 Continue azathioprine  100 mg daily  He continues to exercise at high intensity without limitation  [place] the [place] this [place]. Plans include sight seeing, hiking, boat travel, and enjoying local seafood  Travel Strategies discussed  Subjective:   1. OSA (obstructive sleep apnea) He denies family hx of MENS 2 or MTC He denies personal hx of pancreatitis Will attempt Zepbound  Rx - again  2. Pre-diabetes Lab Results  Component Value Date   HGBA1C 5.8 (H) 03/16/2024   HGBA1C 6.2 06/17/2023   HGBA1C 5.9 (H) 02/24/2023    He is on Metformin  500mg  2 tabs at breakfast- tolerating well  He denies family hx of MENS 2 or MTC He denies personal hx of pancreatitis Will attempt Zepbound  Rx - again  3. Autoimmune hepatitis [facility]) 03/07/2024 [facility]   Subjective  GERMAINE [name] is a 73 y.o. White or Caucasian male who is accompanied by wife, [name]. Patient serves as his own historian. [name] was last seen by me at [facility] on 10/30/2023 and is here today for follow-up.  Patient with AIH.  Previous serologic evaluation notable for a strongly positive ANA and ASMA, negative for hepatitis B and C with no immunity to hepatitis B. No testing for previous hepatitis B exposure or immunity to hepatitis A has been conducted.  Patient underwent liver biopsy on 08/05/2023 showing plasma cells and eosinophils with no interface hepatitis and reported as autoimmune hepatitis versus drug-induced liver injury.  In December 2021 patient had elevated aminotransferases with AST 177 and ALT 305 these had improved to normal limits by April 2022. He had evidence of steatosis on ultrasound in 2021. In June 2024 he again presented with elevated liver chemistries with AST 118 and ALT 181, his liver chemistries returned to normal limits by August 2024 prior to starting intervention. He has had intermittent aminotransferase elevation <2 times upper limits of normal since February 2021. Patient had undergone prostate surgery in March 2024. He received a short course of Ancef  and Bactrim  at the time of his prostatectomy and was treated with Bactrim  again in July 2024 which resulted in a reaction that caused the patient's significant mouth pain. He also started finasteride  in February 2024. We had planned to discontinue his budesonide  however as it  appeared it was more likely that he had a drug-induced liver injury however he had repeat elevation of his liver chemistries at the time of his initial visit.  He is tolerating immunosuppression well and denies any side effects.  Abdominal imaging with increased echogenicity and no other focal abnormalities.  Patient denies hospitalizations for liver disease, history of jaundice,  ascites, GI bleeding or mental status changes.   PLAN: Labs 03/22/2024 Stop budesonide  approximately 03/22/2024 Continue azathioprine  100 mg daily Lifestyle modification reduce risk factors associated with steatotic liver disease  follow up in 6 month(s)   4. ST elevation myocardial infarction (STEMI), unspecified artery (HCC) He continues to exercise at high intensity and denies CP He denies tobacco/vape use  5. HealthCare Mx He and his wife will travel to the [place]  They have plans to travel on speed/sail boats He endorses hx of motion sickness while on sea vessels He denies know hx of QT prolongation Reviewed last several EKGs in system- stable strips  Assessment/Plan:   1. OSA (obstructive sleep apnea) Start - tirzepatide  (ZEPBOUND ) 2.5 MG/0.5ML Pen; Inject 2.5 mg into the skin once a week.  Dispense: 3 mL; Refill: 0  2. Pre-diabetes (Primary) Start - tirzepatide  (ZEPBOUND ) 2.5 MG/0.5ML Pen; Inject 2.5 mg into the skin once a week.  Dispense: 3 mL; Refill: 0 Refill - metFORMIN  (GLUCOPHAGE ) 500 MG tablet; Take 2 tablets (1,000 mg total) by mouth daily with breakfast.  Dispense: 90 tablet; Refill: 0  3. Autoimmune hepatitis (HCC) F/u with Liver Specialist  4. ST elevation myocardial infarction (STEMI), unspecified artery (HCC) Start - tirzepatide  (ZEPBOUND ) 2.5 MG/0.5ML Pen; Inject 2.5 mg into the skin once a week.  Dispense: 3 mL; Refill: 0  5. HealthCare Mx Start  ondansetron  (ZOFRAN ) 4 MG tablet Take 1 tablet (4 mg total) by mouth every 8 (eight) hours as needed for nausea or vomiting. Dispense: 20 tablet, Refills: 0 ordered   6. Obesity, Current BMI 36.98 Start - tirzepatide  (ZEPBOUND ) 2.5 MG/0.5ML Pen; Inject 2.5 mg into the skin once a week.  Dispense: 3 mL; Refill: 0  [name] is currently in the action stage of change. As such, his goal is to continue with weight loss efforts. He has agreed to the Category 3 Plan.   Exercise goals: Older adults should follow  the adult guidelines. When older adults cannot meet the adult guidelines, they should be as physically active as their abilities and conditions will allow.  Older adults should do exercises that maintain or improve balance if they are at risk of falling.  Older adults should determine their level of effort for physical activity relative to their level of fitness.  Older adults with chronic conditions should understand whether and how their conditions affect their ability to do regular physical activity safely.  Behavioral modification strategies: increasing lean protein intake, decreasing simple carbohydrates, increasing vegetables, increasing water  intake, no skipping meals, meal planning and cooking strategies, keeping healthy foods in the home, travel eating strategies, and planning for success.  Andi has agreed to follow-up with our clinic in 4 weeks. He was informed of the importance of frequent follow-up visits to maximize his success with intensive lifestyle modifications for his multiple health conditions.   Check Fasting Labs at next OV   Objective:   Blood pressure 112/73, pulse 61, temperature (!) 97.5 F (36.4 C), height 5\' 11"  (1.803 m), weight 265 lb (120.2 kg), SpO2 96%. Body mass index is 36.96 kg/m.  General: Cooperative, alert, well developed, in no acute distress. HEENT:  Conjunctivae and lids unremarkable. Cardiovascular: Regular rhythm.  Lungs: Normal work of breathing. Neurologic: No focal deficits.   Lab Results  Component Value Date   CREATININE 0.90 03/16/2024   BUN 14 03/16/2024   NA 143 03/16/2024   K 4.8 03/16/2024   CL 107 (H) 03/16/2024   CO2 20 03/16/2024   Lab Results  Component Value Date   ALT 19 03/16/2024   AST 23 03/16/2024   ALKPHOS 78 03/16/2024   BILITOT 0.4 03/16/2024   Lab Results  Component Value Date   HGBA1C 5.8 (H) 03/16/2024   HGBA1C 6.2 06/17/2023   HGBA1C 5.9 (H) 02/24/2023   HGBA1C 5.7 (H) 10/23/2022   HGBA1C 5.8 (H)  06/09/2022   Lab Results  Component Value Date   INSULIN  16.0 03/16/2024   INSULIN  15.6 02/24/2023   INSULIN  19.4 10/23/2022   INSULIN  15.2 06/09/2022   INSULIN  8.4 01/09/2022   Lab Results  Component Value Date   TSH 0.65 06/17/2023   Lab Results  Component Value Date   CHOL 123 06/17/2023   HDL 42.90 06/17/2023   LDLCALC 65 06/17/2023   LDLDIRECT 153.1 06/09/2011   TRIG 75.0 06/17/2023   CHOLHDL 3 06/17/2023   Lab Results  Component Value Date   VD25OH 52.0 03/16/2024   VD25OH 61.69 08/14/2023   VD25OH 46.8 02/24/2023   Lab Results  Component Value Date   WBC 5.0 09/14/2023   HGB 13.3 09/14/2023   HCT 41.0 09/14/2023   MCV 85.4 09/14/2023   PLT 135.0 (L) 09/14/2023   Lab Results  Component Value Date   FERRITIN 20.6 (L) 07/21/2023   Attestation Statements:   Reviewed by clinician on day of visit: allergies, medications, problem list, medical history, surgical history, family history, social history, and previous encounter notes.  I have reviewed the above documentation for accuracy and completeness, and I agree with the above. -  Jaman Aro d. Novali Vollman, NP-C

## 2024-05-17 NOTE — Telephone Encounter (Signed)
 PA for Zepbound  2.5 has been approved. PA is now complete.     Message from Plan Approved. This drug has been approved under the Member's Medicare Part D benefit. Approved quantity: 3 units per 28 day(s). You may fill up to a 90 day supply except for those on Specialty Tier 5, which can be filled up to a 30 day supply. Please call the pharmacy to process the prescription claim.. Authorization Expiration Date: December 28, 2098

## 2024-05-17 NOTE — Telephone Encounter (Signed)
 PA questions for Zepbound 2.5  have been answered and all documentation has been included. Waiting on a determination.

## 2024-05-17 NOTE — Telephone Encounter (Signed)
 PA for Zepbound 2.5 has been submitted, awaiting PA questions.

## 2024-05-18 DIAGNOSIS — R8279 Other abnormal findings on microbiological examination of urine: Secondary | ICD-10-CM | POA: Diagnosis not present

## 2024-05-18 DIAGNOSIS — N453 Epididymo-orchitis: Secondary | ICD-10-CM | POA: Diagnosis not present

## 2024-05-30 ENCOUNTER — Encounter (INDEPENDENT_AMBULATORY_CARE_PROVIDER_SITE_OTHER): Payer: Self-pay | Admitting: Adult Health

## 2024-06-13 ENCOUNTER — Other Ambulatory Visit (INDEPENDENT_AMBULATORY_CARE_PROVIDER_SITE_OTHER): Payer: Self-pay | Admitting: Adult Health

## 2024-06-13 ENCOUNTER — Encounter (INDEPENDENT_AMBULATORY_CARE_PROVIDER_SITE_OTHER): Payer: Self-pay | Admitting: Adult Health

## 2024-06-13 MED ORDER — TIRZEPATIDE-WEIGHT MANAGEMENT 5 MG/0.5ML ~~LOC~~ SOAJ
5.0000 mg | SUBCUTANEOUS | 0 refills | Status: DC
  Filled 2024-06-16: qty 2, 28d supply, fill #0

## 2024-06-15 ENCOUNTER — Other Ambulatory Visit (HOSPITAL_COMMUNITY): Payer: Self-pay

## 2024-06-16 ENCOUNTER — Encounter: Payer: Self-pay | Admitting: Internal Medicine

## 2024-06-16 ENCOUNTER — Other Ambulatory Visit (HOSPITAL_COMMUNITY): Payer: Self-pay

## 2024-06-16 DIAGNOSIS — K754 Autoimmune hepatitis: Secondary | ICD-10-CM | POA: Insufficient documentation

## 2024-06-16 NOTE — Progress Notes (Signed)
 Subjective:    Patient ID: [name], male    DOB: 1951-05-12, 73 y.o.   MRN: [id number]     HPI [name] is here for follow up of his chronic medical problems.  Sleeps 5-6 hrs.  Feels tired in afternoon, then ok.   Exercising regularly  Zepbound  - just finishing 2.5 mg dose -- has  a little GERD  Medications and allergies reviewed with patient and updated if appropriate.  Current Outpatient Medications on File Prior to Visit  Medication Sig Dispense Refill   amLODipine  (NORVASC ) 5 MG tablet Take 1 tablet (5 mg total) by mouth daily. 90 tablet 3   aspirin  81 MG chewable tablet Chew 1 tablet (81 mg total) by mouth daily.     atorvastatin  (LIPITOR ) 80 MG tablet Take 1 tablet (80 mg total) by mouth daily. 90 tablet 3   azaTHIOprine  (IMURAN ) 50 MG tablet Take 2 tablets (100 mg total) by mouth daily. 60 tablet 1   cholecalciferol (VITAMIN D3) 25 MCG (1000 UNIT) tablet Take 1,000 Units by mouth daily.     clopidogrel  (PLAVIX ) 75 MG tablet TAKE 1 TABLET BY MOUTH EVERY DAY 90 tablet 2   finasteride  (PROSCAR ) 5 MG tablet Take 5 mg by mouth daily.     levothyroxine  (SYNTHROID ) 137 MCG tablet Take 1 tablet (137 mcg total) by mouth daily before breakfast. 90 tablet 3   metFORMIN  (GLUCOPHAGE ) 500 MG tablet Take 2 tablets (1,000 mg total) by mouth daily with breakfast. 90 tablet 0   metoprolol  succinate (TOPROL -XL) 25 MG 24 hr tablet TAKE 1 TABLET (25 MG TOTAL) BY MOUTH DAILY. 90 tablet 1   telmisartan  (MICARDIS ) 80 MG tablet TAKE 1 TABLET BY MOUTH EVERY DAY 90 tablet 2   tirzepatide  (ZEPBOUND ) 5 MG/0.5ML Pen Inject 5 mg into the skin once a week. 2 mL 0   nitroGLYCERIN  (NITROSTAT ) 0.4 MG SL tablet Place 1 tablet (0.4 mg total) under the tongue every 5 (five) minutes as needed for chest pain. 25 tablet 3   ondansetron  (ZOFRAN ) 4 MG tablet Take 1 tablet (4 mg total) by mouth every 8 (eight) hours as needed for nausea or vomiting. 20 tablet 0   No current facility-administered  medications on file prior to visit.     Review of Systems  Constitutional:  Negative for fever.  Respiratory:  Negative for cough, shortness of breath and wheezing.   Cardiovascular:  Negative for chest pain, palpitations and leg swelling.  Gastrointestinal:  Negative for abdominal pain and constipation.       Occ GERD  Neurological:  Negative for light-headedness and headaches.       Objective:   Vitals:   06/17/24 1042  BP: 128/78  Pulse: 65  Temp: 98 F (36.7 C)  SpO2: 96%   BP Readings from Last 3 Encounters:  06/17/24 128/78  05/17/24 112/73  04/08/24 128/76   Wt Readings from Last 3 Encounters:  06/17/24 260 lb (117.9 kg)  05/17/24 265 lb (120.2 kg)  04/08/24 271 lb 3.2 oz (123 kg)   Body mass index is 36.26 kg/m.    Physical Exam Constitutional:      General: He is not in acute distress.    Appearance: Normal appearance. He is not ill-appearing.  HENT:     Head: Normocephalic and atraumatic.   Eyes:     Conjunctiva/sclera: Conjunctivae normal.    Cardiovascular:     Rate and Rhythm: Normal rate and regular rhythm.  Heart sounds: Normal heart sounds.  Pulmonary:     Effort: Pulmonary effort is normal. No respiratory distress.     Breath sounds: Normal breath sounds. No wheezing or rales.  Abdominal:     General: There is no distension.     Palpations: Abdomen is soft.     Tenderness: There is no abdominal tenderness.     Comments: obese   Musculoskeletal:     Right lower leg: No edema.     Left lower leg: No edema.   Skin:    General: Skin is warm and dry.     Findings: No rash.   Neurological:     Mental Status: He is alert. Mental status is at baseline.   Psychiatric:        Mood and Affect: Mood normal.        Lab Results  Component Value Date   WBC 5.0 09/14/2023   HGB 13.3 09/14/2023   HCT 41.0 09/14/2023   PLT 135.0 (L) 09/14/2023   GLUCOSE 102 (H) 03/16/2024   CHOL 123 06/17/2023   TRIG 75.0 06/17/2023   HDL 42.90  06/17/2023   LDLDIRECT 153.1 06/09/2011   LDLCALC 65 06/17/2023   ALT 19 03/16/2024   AST 23 03/16/2024   NA 143 03/16/2024   K 4.8 03/16/2024   CL 107 (H) 03/16/2024   CREATININE 0.90 03/16/2024   BUN 14 03/16/2024   CO2 20 03/16/2024   TSH 0.65 06/17/2023   PSA 10 03/13/2022   INR 1.0 08/05/2023   HGBA1C 5.8 (H) 03/16/2024     Assessment & Plan:    See Problem List for Assessment and Plan of chronic medical problems.

## 2024-06-16 NOTE — Assessment & Plan Note (Addendum)
 Chronic [facility] Currently on Imuran 

## 2024-06-16 NOTE — Patient Instructions (Addendum)
      Blood work was ordered.       Medications changes include :   None    A referral was ordered and someone will call you to schedule an appointment.     Return in about 1 year (around 06/17/2025) for follow up.

## 2024-06-17 ENCOUNTER — Ambulatory Visit: Payer: Medicare Other | Admitting: Internal Medicine

## 2024-06-17 VITALS — BP 128/78 | HR 65 | Temp 98.0°F | Ht 71.0 in | Wt 260.0 lb

## 2024-06-17 DIAGNOSIS — K754 Autoimmune hepatitis: Secondary | ICD-10-CM

## 2024-06-17 DIAGNOSIS — I10 Essential (primary) hypertension: Secondary | ICD-10-CM

## 2024-06-17 DIAGNOSIS — K760 Fatty (change of) liver, not elsewhere classified: Secondary | ICD-10-CM

## 2024-06-17 DIAGNOSIS — I2510 Atherosclerotic heart disease of native coronary artery without angina pectoris: Secondary | ICD-10-CM | POA: Diagnosis not present

## 2024-06-17 DIAGNOSIS — E559 Vitamin D deficiency, unspecified: Secondary | ICD-10-CM | POA: Diagnosis not present

## 2024-06-17 DIAGNOSIS — E034 Atrophy of thyroid (acquired): Secondary | ICD-10-CM

## 2024-06-17 DIAGNOSIS — N401 Enlarged prostate with lower urinary tract symptoms: Secondary | ICD-10-CM | POA: Diagnosis not present

## 2024-06-17 DIAGNOSIS — Z9861 Coronary angioplasty status: Secondary | ICD-10-CM | POA: Diagnosis not present

## 2024-06-17 DIAGNOSIS — E785 Hyperlipidemia, unspecified: Secondary | ICD-10-CM | POA: Diagnosis not present

## 2024-06-17 DIAGNOSIS — D696 Thrombocytopenia, unspecified: Secondary | ICD-10-CM

## 2024-06-17 DIAGNOSIS — N138 Other obstructive and reflux uropathy: Secondary | ICD-10-CM

## 2024-06-17 DIAGNOSIS — R7303 Prediabetes: Secondary | ICD-10-CM | POA: Diagnosis not present

## 2024-06-17 DIAGNOSIS — G4733 Obstructive sleep apnea (adult) (pediatric): Secondary | ICD-10-CM

## 2024-06-17 NOTE — Assessment & Plan Note (Signed)
 Chronic Continue vitamin D supplementation

## 2024-06-17 NOTE — Assessment & Plan Note (Signed)
 Chronic Lab Results  Component Value Date   HGBA1C 5.8 (H) 03/16/2024   On metformin  1000 mg with breakfast Also on Zepbound  Working on weight loss

## 2024-06-17 NOTE — Assessment & Plan Note (Signed)
 Chronic  Clinically euthyroid Currently taking levothyroxine  137 mcg daily Needs TSH drawn, but would prefer [facility]-advised that if this is not done there he needs to return to our lab to have it drawn Titrate med dose if needed

## 2024-06-17 NOTE — Assessment & Plan Note (Signed)
Chronic Blood pressure well controlled Continue continue metoprolol XL 25 mg daily, telmisartan 80 mg daily

## 2024-06-17 NOTE — Assessment & Plan Note (Signed)
 Chronic Needs CBC Deferred blood work today will have [facility] done [facility]

## 2024-06-17 NOTE — Assessment & Plan Note (Signed)
 Chronic S/p STEMI Following with cardiology Continue atorvastatin  80 mg daily, aspirin  81 mg daily and Plavix  75 mg daily BP well-controlled

## 2024-06-17 NOTE — Assessment & Plan Note (Signed)
 Chronic [facility]

## 2024-06-17 NOTE — Assessment & Plan Note (Signed)
 Chronic Follow with cardiology Continue atorvastatin  80 mg daily

## 2024-06-17 NOTE — Assessment & Plan Note (Signed)
 Chronic Following with urology

## 2024-06-17 NOTE — Assessment & Plan Note (Signed)
Chronic Using BiPAP nightly

## 2024-06-27 ENCOUNTER — Other Ambulatory Visit (INDEPENDENT_AMBULATORY_CARE_PROVIDER_SITE_OTHER): Payer: Self-pay | Admitting: Adult Health

## 2024-06-27 DIAGNOSIS — R7303 Prediabetes: Secondary | ICD-10-CM

## 2024-07-08 ENCOUNTER — Encounter: Payer: Self-pay | Admitting: Internal Medicine

## 2024-07-11 ENCOUNTER — Encounter (INDEPENDENT_AMBULATORY_CARE_PROVIDER_SITE_OTHER): Payer: Self-pay | Admitting: Adult Health

## 2024-07-11 ENCOUNTER — Other Ambulatory Visit (HOSPITAL_COMMUNITY): Payer: Self-pay

## 2024-07-11 ENCOUNTER — Ambulatory Visit (INDEPENDENT_AMBULATORY_CARE_PROVIDER_SITE_OTHER): Admitting: Adult Health

## 2024-07-11 VITALS — BP 107/66 | HR 60 | Temp 98.1°F | Ht 71.0 in | Wt 252.0 lb

## 2024-07-11 DIAGNOSIS — Z6835 Body mass index (BMI) 35.0-35.9, adult: Secondary | ICD-10-CM

## 2024-07-11 DIAGNOSIS — D696 Thrombocytopenia, unspecified: Secondary | ICD-10-CM | POA: Diagnosis not present

## 2024-07-11 DIAGNOSIS — K760 Fatty (change of) liver, not elsewhere classified: Secondary | ICD-10-CM

## 2024-07-11 DIAGNOSIS — E559 Vitamin D deficiency, unspecified: Secondary | ICD-10-CM

## 2024-07-11 DIAGNOSIS — G4733 Obstructive sleep apnea (adult) (pediatric): Secondary | ICD-10-CM | POA: Diagnosis not present

## 2024-07-11 DIAGNOSIS — E038 Other specified hypothyroidism: Secondary | ICD-10-CM | POA: Diagnosis not present

## 2024-07-11 DIAGNOSIS — R7303 Prediabetes: Secondary | ICD-10-CM

## 2024-07-11 DIAGNOSIS — E669 Obesity, unspecified: Secondary | ICD-10-CM | POA: Diagnosis not present

## 2024-07-11 DIAGNOSIS — I213 ST elevation (STEMI) myocardial infarction of unspecified site: Secondary | ICD-10-CM

## 2024-07-11 MED ORDER — TIRZEPATIDE-WEIGHT MANAGEMENT 7.5 MG/0.5ML ~~LOC~~ SOAJ
7.5000 mg | SUBCUTANEOUS | 0 refills | Status: DC
  Filled 2024-07-11: qty 2, 28d supply, fill #0

## 2024-07-11 MED ORDER — METFORMIN HCL 500 MG PO TABS: 1000.0000 mg | ORAL_TABLET | Freq: Every day | ORAL | 0 refills | Status: DC

## 2024-07-11 NOTE — Progress Notes (Signed)
 WEIGHT SUMMARY AND BIOMETRICS  Vitals Temp: 98.1 F (36.7 C) BP: 107/66 Pulse Rate: 60 SpO2: 98 %   Anthropometric Measurements Height: 5' 11 (1.803 m) Weight: 252 lb (114.3 kg) BMI (Calculated): 35.16 Weight at Last Visit: 265 lb Weight Lost Since Last Visit: 13 lb Weight Gained Since Last Visit: 0 Starting Weight: 266 lb Total Weight Loss (lbs): 14 lb (6.35 kg)   Body Composition  Body Fat %: 35 % Fat Mass (lbs): 88.2 lbs Muscle Mass (lbs): 155.8 lbs Total Body Water  (lbs): 112.4 lbs Visceral Fat Rating : 22   Other Clinical Data Fasting: yes Labs: yes Today's Visit #: 28 Starting Date: 12/29/20    Chief Complaint:   OBESITY [name] is here to discuss his progress with his obesity treatment plan.  He is on the the Category 3 Plan and states he is following his eating plan approximately 75 % of the time.  He states he is exercising Swimming/Strength Training/Yard Work 60 mins/3 hrs 5/7 times per week.  Interim History:  He was started on weekly Zepboound 2.5mg  on/about 05/19/2024 Zepbound  2.5mg  increased to 5mg  end of June 2025 He has had 3 doses- will occasionally experience evening acid reflux that he can treat with OTC tums He reports increased appetite the last 2-3 weeks He administers Zepbound  on Friday  Reviewed Bioimpedance results with pt: Muscle Mass: -3.4 lbs Adipose Mass: -10.2 lbs  Subjective:   1. Low platelet count (HCC) Chronic in nature PCP requests updated CBC   Latest Reference Range & Units 06/17/23 11:28 08/05/23 12:01 09/14/23 08:05  Platelets 150.0 - 400.0 K/uL 183.0 145 (L) 135.0 (L)  (L): Data is abnormally low  2. OSA (obstructive sleep apnea) He estimates to wear nightly CPAP at least 6 hrs He is able to exercise at high intensity and perform yaard work > 3 hrs in duration  3. ST elevation myocardial infarction (STEMI), unspecified artery (HCC) Admit date: 12/30/2018 Discharge date: 01/01/2019   Primary Care  Provider: Geofm Glade PARAS, MD  Primary Cardiologist: Dr shlomo Primary Electrophysiologist:     Discharge Diagnoses    1 status post inferior STEMI-status post PCI of PDA.     2 hypertension-Continue present regimen.  Follow blood pressure at home and we will advance medications as needed.   3 hyperlipidemia-LDL 111 statin added.  Patient will need lipids and liver checked in 8 weeks.   4 obstructive sleep apnea-follow-up Dr. shlomo.  4. NAFLD (nonalcoholic fatty liver disease) 2/89/7975 Narrative & Impression  CLINICAL DATA:  Elevated LFTs   EXAM: ULTRASOUND ABDOMEN LIMITED RIGHT UPPER QUADRANT   COMPARISON:  None Available.   FINDINGS: Gallbladder:   No gallstones or wall thickening visualized. No sonographic Murphy sign noted by sonographer.   Common bile duct:   Diameter: 6 mm, prominent.   Liver:   Increased echogenicity. No focal lesion. Portal vein is patent on color Doppler imaging with normal direction of blood flow towards the liver.   Other: None.   IMPRESSION: 1. Increased hepatic parenchymal echogenicity suggestive of steatosis. 2. No cholelithiasis or sonographic evidence for acute cholecystitis.    5. Vitamin D  deficiency  Latest Reference Range & Units 03/16/24 12:32  Vitamin D , 25-Hydroxy 30.0 - 100.0 ng/mL 52.0   He is taking OTC Vit D 3 1,000 international units  He is taking 3 caps a day= 3,000 international units   6. Other specified hypothyroidism Chronic in nature Clinically euthroid PCP manages daily Levothyroxine  137 mcg PCP would like  updated levels  7. Pre-diabetes Lab Results  Component Value Date   HGBA1C 5.8 (H) 03/16/2024   HGBA1C 6.2 06/17/2023   HGBA1C 5.9 (H) 02/24/2023    He is currently on daily Metformin  500mg  - 2 tabs with breakfast He was started on weekly Zepboound 2.5mg  on/about 05/19/2024 Zepbound  2.5mg  increased to 5mg  end of June 2025 He has had 3 doses- will occasionally experience evening acid reflux  that he can treat with OTC tums He reports increased appetite the last 2-3 weeks He administers Zepbound  on Friday  Assessment/Plan:   1. Low platelet count (HCC) Check Labs - CBC w/Diff/Platelet Forward results to PCP  2. OSA (obstructive sleep apnea) Continue with weight loss efforts Continue nightly CPAP and weekly Zepbound  therapy  3. ST elevation myocardial infarction (STEMI), unspecified artery (HCC) Continue healthy eating and regular exercise  4. NAFLD (nonalcoholic fatty liver disease) Check Labs - Comprehensive metabolic panel with GFR  5. Vitamin D  deficiency Check Labs - VITAMIN D  25 Hydroxy (Vit-D Deficiency, Fractures)  6. Other specified hypothyroidism Check Labs - TSH + free T4 Forward results to PCP  7. Pre-diabetes Check Labs - Hemoglobin A1c - Insulin , random - Vitamin B12 Refill - metFORMIN  (GLUCOPHAGE ) 500 MG tablet; Take 2 tablets (1,000 mg total) by mouth daily with breakfast.  Dispense: 90 tablet; Refill: 0  8. Obesity, Current BMI 35.1 (Primary) Refill and INCREASE Take 4th dose of Zepbound  5mg  this Friday, next week take 7.5mg  strength  tirzepatide  (ZEPBOUND ) 7.5 MG/0.5ML Pen Inject 7.5 mg into the skin once a week. Dispense: 2 mL, Refills: 0 of 0 remaining   Arend is currently in the action stage of change. As such, his goal is to continue with weight loss efforts. He has agreed to the Category 3 Plan.   Exercise goals: Older adults should follow the adult guidelines. When older adults cannot meet the adult guidelines, they should be as physically active as their abilities and conditions will allow.  Older adults should do exercises that maintain or improve balance if they are at risk of falling.  Older adults should determine their level of effort for physical activity relative to their level of fitness.  Older adults with chronic conditions should understand whether and how their conditions affect their ability to do regular physical  activity safely.  Behavioral modification strategies: increasing lean protein intake, decreasing simple carbohydrates, increasing vegetables, increasing water  intake, meal planning and cooking strategies, keeping healthy foods in the home, ways to avoid boredom eating, ways to avoid night time snacking, and planning for success.  Staton has agreed to follow-up with our clinic in 4 weeks. He was informed of the importance of frequent follow-up visits to maximize his success with intensive lifestyle modifications for his multiple health conditions.   Breylen was informed we would discuss his lab results at his next visit unless there is a critical issue that needs to be addressed sooner. Brayant agreed to keep his next visit at the agreed upon time to discuss these results.  Objective:   Blood pressure 107/66, pulse 60, temperature 98.1 F (36.7 C), height 5' 11 (1.803 m), weight 252 lb (114.3 kg), SpO2 98%. Body mass index is 35.15 kg/m.  General: Cooperative, alert, well developed, in no acute distress. HEENT: Conjunctivae and lids unremarkable. Cardiovascular: Regular rhythm.  Lungs: Normal work of breathing. Neurologic: No focal deficits.   Lab Results  Component Value Date   CREATININE 0.90 03/16/2024   BUN 14 03/16/2024   NA 143 03/16/2024  K 4.8 03/16/2024   CL 107 (H) 03/16/2024   CO2 20 03/16/2024   Lab Results  Component Value Date   ALT 19 03/16/2024   AST 23 03/16/2024   ALKPHOS 78 03/16/2024   BILITOT 0.4 03/16/2024   Lab Results  Component Value Date   HGBA1C 5.8 (H) 03/16/2024   HGBA1C 6.2 06/17/2023   HGBA1C 5.9 (H) 02/24/2023   HGBA1C 5.7 (H) 10/23/2022   HGBA1C 5.8 (H) 06/09/2022   Lab Results  Component Value Date   INSULIN  16.0 03/16/2024   INSULIN  15.6 02/24/2023   INSULIN  19.4 10/23/2022   INSULIN  15.2 06/09/2022   INSULIN  8.4 01/09/2022   Lab Results  Component Value Date   TSH 0.65 06/17/2023   Lab Results  Component Value Date    CHOL 123 06/17/2023   HDL 42.90 06/17/2023   LDLCALC 65 06/17/2023   LDLDIRECT 153.1 06/09/2011   TRIG 75.0 06/17/2023   CHOLHDL 3 06/17/2023   Lab Results  Component Value Date   VD25OH 52.0 03/16/2024   VD25OH 61.69 08/14/2023   VD25OH 46.8 02/24/2023   Lab Results  Component Value Date   WBC 5.0 09/14/2023   HGB 13.3 09/14/2023   HCT 41.0 09/14/2023   MCV 85.4 09/14/2023   PLT 135.0 (L) 09/14/2023   Lab Results  Component Value Date   FERRITIN 20.6 (L) 07/21/2023   Attestation Statements:   Reviewed by clinician on day of visit: allergies, medications, problem list, medical history, surgical history, family history, social history, and previous encounter notes.  I have reviewed the above documentation for accuracy and completeness, and I agree with the above. -  Chirstopher Iovino d. Macy Lingenfelter, NP-C

## 2024-07-12 LAB — HEMOGLOBIN A1C
Est. average glucose Bld gHb Est-mCnc: 117 mg/dL
Hgb A1c MFr Bld: 5.7 % — ABNORMAL HIGH (ref 4.8–5.6)

## 2024-07-12 LAB — CBC WITH DIFFERENTIAL/PLATELET
Basophils Absolute: 0 x10E3/uL (ref 0.0–0.2)
Basos: 1 %
EOS (ABSOLUTE): 0.1 x10E3/uL (ref 0.0–0.4)
Eos: 3 %
Hematocrit: 43.3 % (ref 37.5–51.0)
Hemoglobin: 14 g/dL (ref 13.0–17.7)
Immature Grans (Abs): 0 x10E3/uL (ref 0.0–0.1)
Immature Granulocytes: 0 %
Lymphocytes Absolute: 0.8 x10E3/uL (ref 0.7–3.1)
Lymphs: 19 %
MCH: 29.6 pg (ref 26.6–33.0)
MCHC: 32.3 g/dL (ref 31.5–35.7)
MCV: 92 fL (ref 79–97)
Monocytes Absolute: 0.5 x10E3/uL (ref 0.1–0.9)
Monocytes: 12 %
Neutrophils Absolute: 2.7 x10E3/uL (ref 1.4–7.0)
Neutrophils: 65 %
Platelets: 141 x10E3/uL — ABNORMAL LOW (ref 150–450)
RBC: 4.73 x10E6/uL (ref 4.14–5.80)
RDW: 13.2 % (ref 11.6–15.4)
WBC: 4.1 x10E3/uL (ref 3.4–10.8)

## 2024-07-12 LAB — VITAMIN B12: Vitamin B-12: 1068 pg/mL (ref 232–1245)

## 2024-07-12 LAB — COMPREHENSIVE METABOLIC PANEL WITH GFR
ALT: 21 IU/L (ref 0–44)
AST: 26 IU/L (ref 0–40)
Albumin: 4.5 g/dL (ref 3.8–4.8)
Alkaline Phosphatase: 70 IU/L (ref 44–121)
BUN/Creatinine Ratio: 15 (ref 10–24)
BUN: 14 mg/dL (ref 8–27)
Bilirubin Total: 0.7 mg/dL (ref 0.0–1.2)
CO2: 21 mmol/L (ref 20–29)
Calcium: 10.1 mg/dL (ref 8.6–10.2)
Chloride: 104 mmol/L (ref 96–106)
Creatinine, Ser: 0.95 mg/dL (ref 0.76–1.27)
Globulin, Total: 2.4 g/dL (ref 1.5–4.5)
Glucose: 98 mg/dL (ref 70–99)
Potassium: 4.8 mmol/L (ref 3.5–5.2)
Sodium: 139 mmol/L (ref 134–144)
Total Protein: 6.9 g/dL (ref 6.0–8.5)
eGFR: 86 mL/min/1.73 (ref >59)

## 2024-07-12 LAB — INSULIN, RANDOM: INSULIN: 26.7 u[IU]/mL — ABNORMAL HIGH (ref 2.6–24.9)

## 2024-07-12 LAB — TSH+FREE T4
Free T4: 1.37 ng/dL (ref 0.82–1.77)
TSH: 1.83 u[IU]/mL (ref 0.450–4.500)

## 2024-07-12 LAB — VITAMIN D 25 HYDROXY (VIT D DEFICIENCY, FRACTURES): Vit D, 25-Hydroxy: 55.5 ng/mL (ref 30.0–100.0)

## 2024-07-27 ENCOUNTER — Other Ambulatory Visit: Payer: Self-pay | Admitting: Cardiology

## 2024-07-28 ENCOUNTER — Other Ambulatory Visit: Payer: Self-pay | Admitting: Internal Medicine

## 2024-08-08 ENCOUNTER — Other Ambulatory Visit (HOSPITAL_COMMUNITY): Payer: Self-pay

## 2024-08-08 ENCOUNTER — Encounter (INDEPENDENT_AMBULATORY_CARE_PROVIDER_SITE_OTHER): Payer: Self-pay | Admitting: Adult Health

## 2024-08-08 ENCOUNTER — Encounter: Payer: Self-pay | Admitting: Internal Medicine

## 2024-08-08 ENCOUNTER — Ambulatory Visit (INDEPENDENT_AMBULATORY_CARE_PROVIDER_SITE_OTHER): Admitting: Adult Health

## 2024-08-08 VITALS — BP 113/71 | HR 62 | Temp 97.8°F | Ht 71.0 in | Wt 243.0 lb

## 2024-08-08 DIAGNOSIS — Z6834 Body mass index (BMI) 34.0-34.9, adult: Secondary | ICD-10-CM

## 2024-08-08 DIAGNOSIS — G4733 Obstructive sleep apnea (adult) (pediatric): Secondary | ICD-10-CM

## 2024-08-08 DIAGNOSIS — R7303 Prediabetes: Secondary | ICD-10-CM

## 2024-08-08 DIAGNOSIS — D696 Thrombocytopenia, unspecified: Secondary | ICD-10-CM | POA: Diagnosis not present

## 2024-08-08 DIAGNOSIS — E038 Other specified hypothyroidism: Secondary | ICD-10-CM | POA: Diagnosis not present

## 2024-08-08 DIAGNOSIS — E669 Obesity, unspecified: Secondary | ICD-10-CM

## 2024-08-08 DIAGNOSIS — K760 Fatty (change of) liver, not elsewhere classified: Secondary | ICD-10-CM

## 2024-08-08 DIAGNOSIS — E559 Vitamin D deficiency, unspecified: Secondary | ICD-10-CM

## 2024-08-08 DIAGNOSIS — E66812 Obesity, class 2: Secondary | ICD-10-CM

## 2024-08-08 MED ORDER — TIRZEPATIDE-WEIGHT MANAGEMENT 7.5 MG/0.5ML ~~LOC~~ SOAJ
7.5000 mg | SUBCUTANEOUS | 0 refills | Status: DC
  Filled 2024-08-08 (×2): qty 2, 28d supply, fill #0

## 2024-08-08 MED ORDER — METFORMIN HCL 500 MG PO TABS: 1000.0000 mg | ORAL_TABLET | Freq: Every day | ORAL | 0 refills | Status: DC

## 2024-08-08 MED ORDER — TIRZEPATIDE-WEIGHT MANAGEMENT 7.5 MG/0.5ML ~~LOC~~ SOAJ: 7.5000 mg | SUBCUTANEOUS | 0 refills | Status: DC

## 2024-08-08 NOTE — Progress Notes (Addendum)
 WEIGHT SUMMARY AND BIOMETRICS  Vitals Temp: 97.8 F (36.6 C) BP: 113/71 Pulse Rate: 62 SpO2: 98 %   Anthropometric Measurements Height: 5' 11 (1.803 m) Weight: 243 lb (110.2 kg) BMI (Calculated): 33.91 Weight at Last Visit: 252lb Weight Lost Since Last Visit: 9lb Weight Gained Since Last Visit: 0lb Starting Weight: 266lb Total Weight Loss (lbs): 23 lb (10.4 kg)   Body Composition  Body Fat %: 34 % Fat Mass (lbs): 82.8 lbs Muscle Mass (lbs): 152.8 lbs Total Body Water  (lbs): 108.8 lbs Visceral Fat Rating : 21   Other Clinical Data Fasting: No Labs: no Today's Visit #: 29 Starting Date: 12/29/20    Chief Complaint:   OBESITY [name] is here to discuss his progress with his obesity treatment plan.  He is on the the Category 3 Plan and states he is following his eating plan approximately 60 % of the time.  He states he is exercising Swimming 60 minutes 5 times per week.  Interim History:  He was started on weekly Zepboound 2.5mg  on/about 05/19/2024 Zepbound  2.5mg  increased to 5mg  end of June 2025 Zepound 5mg  increased to 7.5mg  on/about 07/11/24 He has had 3 doses SE: Constipation and GERD He has started daily OTC ant acid- [place] Acid Control He is using daily OTC Miralax   TSH/Free T4/CBC results forwarded onto PCP  Subjective:   1. Other specified hypothyroidism Discussed Labs  Latest Reference Range & Units 07/11/24 08:31  TSH 0.450 - 4.500 uIU/mL 1.830  T4,Free(Direct) 0.82 - 1.77 ng/dL 8.62   Thyroid  panel stable PCP manages daily Synthriod 137 mcg  2. OSA (obstructive sleep apnea) Discussed Labs He estimates to uses CPAP at least 6 hrs per night  3. NAFLD (nonalcoholic fatty liver disease) Discussed Labs   Latest Reference Range & Units 07/11/24 08:31  Alkaline Phosphatase 44 - 121 IU/L 70  Albumin 3.8 - 4.8 g/dL 4.5  AST 0 - 40 IU/L 26  ALT 0 - 44 IU/L 21   Live enzymes normalized He denies N/V He is experiencing GERD-  treated with OTC Acid reducer  4. Vitamin D  deficiency Discussed Labs  Latest Reference Range & Units 07/11/24 08:31  Vitamin D , 25-Hydroxy 30.0 - 100.0 ng/mL 55.5   Vit D level stable and at goal He is on OTC Vit D3 1,000 international units   5. Low platelet count (HCC) Discussed Labs  Latest Reference Range & Units 07/11/24 08:31  Hemoglobin 13.0 - 17.7 g/dL 85.9  HCT 62.4 - 48.9 % 43.3    Latest Reference Range & Units 07/11/24 08:31  Platelets 150 - 450 x10E3/uL 141 (L)  (L): Data is abnormally low  H/H stable and with slightly improved Plt He endorses easy bruising He is on Plavix  75mg  and ASA 81 mg daily (antiplt therapy)- recent kidney fx stable He is followed by Cards  Of note- hx of ASCAD s/p inferior STEMI with cath showing 95% PDA that was felt to be the culprit vessel.  This was treated with PCI-DES. Also noted was CTO CFX with collaterals from the PDA on OM1.  There was an unsuccessful attempt at opening the CFX. Also has 70% mid LAD lesion, to be treated medically. EF was 60% with inferobasilar HK. Echo showed EF 65% with no WMA, grade 1 DD.   He also has a hx of OSA on CPAP, obesity, HTN and PAF on anticoagulation.  He is meds and is tolerating meds with no SE.    6. Prediabetes Discussed Labs  Latest Reference Range & Units 07/11/24 08:31  Glucose 70 - 99 mg/dL 98  Hemoglobin J8R 4.8 - 5.6 % 5.7 (H)  Est. average glucose Bld gHb Est-mCnc mg/dL 882  INSULIN  2.6 - 24.9 uIU/mL 26.7 (H)  (H): Data is abnormally high  Latest Reference Range & Units 07/11/24 08:31  Vitamin B12 232 - 1,245 pg/mL 1,068    Latest Reference Range & Units 07/11/24 08:31  eGFR >59 mL/min/1.73 86   CBG at goal A1c improved, still in prediabetic range Insulin  level worsened B12 stable GFR at goal He is on daily Metformin  500mg  (2 tabs with first meal) and weekly Zepbound  7.5mg  Zepound 5mg  increased to 7.5mg  on/about 07/11/24 He has had 3 doses SE: Constipation and GERD He has  started daily OTC ant acid- he is unsure of brand/dosage He is using daily OTC Miralax  Denies mass in neck, dysphagia, persistent hoarseness, abdominal pain, or N/V  Assessment/Plan:   1. Other specified hypothyroidism Results forwarded to PCP  2. OSA (obstructive sleep apnea) (Primary) Refill tirzepatide  (ZEPBOUND ) 7.5 MG/0.5ML Pen Inject 7.5 mg into the skin once a week. Dispense: 2 mL, Refills: 0 ordered   3. NAFLD (nonalcoholic fatty liver disease) Refill tirzepatide  (ZEPBOUND ) 7.5 MG/0.5ML Pen Inject 7.5 mg into the skin once a week. Dispense: 2 mL, Refills: 0 ordered   4. Vitamin D  deficiency Continue OTC supplementation  5. Low platelet count (HCC) Results forwarded to PCP Monitor for worsening bruising or uncontrollable bleeding  6. Prediabetes Refill   metFORMIN  (GLUCOPHAGE ) 500 MG tablet Take 2 tablets (1,000 mg total) by mouth daily with breakfast. Dispense: 90 tablet, Refills: 0 ordered   6. Obesity, Current BMI 34.0 Refill tirzepatide  (ZEPBOUND ) 7.5 MG/0.5ML Pen Inject 7.5 mg into the skin once a week. Dispense: 2 mL, Refills: 0 ordered   [name] is currently in the action stage of change. As such, his goal is to continue with weight loss efforts. He has agreed to the Category 3 Plan.   Exercise goals: Older adults should follow the adult guidelines. When older adults cannot meet the adult guidelines, they should be as physically active as their abilities and conditions will allow.  Older adults should do exercises that maintain or improve balance if they are at risk of falling.  Older adults should determine their level of effort for physical activity relative to their level of fitness.  Older adults with chronic conditions should understand whether and how their conditions affect their ability to do regular physical activity safely.  Behavioral modification strategies: increasing lean protein intake, decreasing simple carbohydrates, increasing vegetables, increasing  water  intake, no skipping meals, meal planning and cooking strategies, keeping healthy foods in the home, and planning for success.  [name] has agreed to follow-up with our clinic in 4 weeks. He was informed of the importance of frequent follow-up visits to maximize his success with intensive lifestyle modifications for his multiple health conditions.   Objective:   Blood pressure 113/71, pulse 62, temperature 97.8 F (36.6 C), height 5' 11 (1.803 m), weight 243 lb (110.2 kg), SpO2 98%. Body mass index is 33.89 kg/m.  General: Cooperative, alert, well developed, in no acute distress. HEENT: Conjunctivae and lids unremarkable. Cardiovascular: Regular rhythm.  Lungs: Normal work of breathing. Neurologic: No focal deficits.   Lab Results  Component Value Date   CREATININE 0.95 07/11/2024   BUN 14 07/11/2024   NA 139 07/11/2024   K 4.8 07/11/2024   CL 104 07/11/2024   CO2 21 07/11/2024   Lab  Results  Component Value Date   ALT 21 07/11/2024   AST 26 07/11/2024   ALKPHOS 70 07/11/2024   BILITOT 0.7 07/11/2024   Lab Results  Component Value Date   HGBA1C 5.7 (H) 07/11/2024   HGBA1C 5.8 (H) 03/16/2024   HGBA1C 6.2 06/17/2023   HGBA1C 5.9 (H) 02/24/2023   HGBA1C 5.7 (H) 10/23/2022   Lab Results  Component Value Date   INSULIN  26.7 (H) 07/11/2024   INSULIN  16.0 03/16/2024   INSULIN  15.6 02/24/2023   INSULIN  19.4 10/23/2022   INSULIN  15.2 06/09/2022   Lab Results  Component Value Date   TSH 1.830 07/11/2024   Lab Results  Component Value Date   CHOL 123 06/17/2023   HDL 42.90 06/17/2023   LDLCALC 65 06/17/2023   LDLDIRECT 153.1 06/09/2011   TRIG 75.0 06/17/2023   CHOLHDL 3 06/17/2023   Lab Results  Component Value Date   VD25OH 55.5 07/11/2024   VD25OH 52.0 03/16/2024   VD25OH 61.69 08/14/2023   Lab Results  Component Value Date   WBC 4.1 07/11/2024   HGB 14.0 07/11/2024   HCT 43.3 07/11/2024   MCV 92 07/11/2024   PLT 141 (L) 07/11/2024   Lab  Results  Component Value Date   FERRITIN 20.6 (L) 07/21/2023   Attestation Statements:   Reviewed by clinician on day of visit: allergies, medications, problem list, medical history, surgical history, family history, social history, and previous encounter notes.  I have reviewed the above documentation for accuracy and completeness, and I agree with the above. -  Dava Rensch d. Nicholl Onstott, NP-C

## 2024-08-09 ENCOUNTER — Telehealth (INDEPENDENT_AMBULATORY_CARE_PROVIDER_SITE_OTHER): Payer: Self-pay

## 2024-08-09 NOTE — Telephone Encounter (Signed)
 Patient left a FYI Audiological scientist )

## 2024-09-05 ENCOUNTER — Encounter (INDEPENDENT_AMBULATORY_CARE_PROVIDER_SITE_OTHER): Payer: Self-pay | Admitting: Adult Health

## 2024-09-06 DIAGNOSIS — K754 Autoimmune hepatitis: Secondary | ICD-10-CM | POA: Diagnosis not present

## 2024-09-06 DIAGNOSIS — K760 Fatty (change of) liver, not elsewhere classified: Secondary | ICD-10-CM | POA: Diagnosis not present

## 2024-09-07 ENCOUNTER — Other Ambulatory Visit: Payer: Self-pay | Admitting: Cardiology

## 2024-09-09 ENCOUNTER — Other Ambulatory Visit: Payer: Self-pay | Admitting: Nurse Practitioner

## 2024-09-09 DIAGNOSIS — K760 Fatty (change of) liver, not elsewhere classified: Secondary | ICD-10-CM

## 2024-09-09 DIAGNOSIS — K7401 Hepatic fibrosis, early fibrosis: Secondary | ICD-10-CM | POA: Diagnosis not present

## 2024-09-09 DIAGNOSIS — K754 Autoimmune hepatitis: Secondary | ICD-10-CM

## 2024-09-12 ENCOUNTER — Other Ambulatory Visit (HOSPITAL_COMMUNITY): Payer: Self-pay

## 2024-09-12 ENCOUNTER — Ambulatory Visit
Admission: RE | Admit: 2024-09-12 | Discharge: 2024-09-12 | Disposition: A | Discharge status: Home / Self Care | Source: Ambulatory Visit | Attending: Nurse Practitioner | Admitting: Nurse Practitioner

## 2024-09-12 ENCOUNTER — Encounter (INDEPENDENT_AMBULATORY_CARE_PROVIDER_SITE_OTHER): Payer: Self-pay | Admitting: Adult Health

## 2024-09-12 ENCOUNTER — Ambulatory Visit (INDEPENDENT_AMBULATORY_CARE_PROVIDER_SITE_OTHER): Admitting: Adult Health

## 2024-09-12 VITALS — BP 111/74 | HR 68 | Temp 97.6°F | Ht 71.0 in | Wt 237.0 lb

## 2024-09-12 DIAGNOSIS — Z6833 Body mass index (BMI) 33.0-33.9, adult: Secondary | ICD-10-CM

## 2024-09-12 DIAGNOSIS — E669 Obesity, unspecified: Secondary | ICD-10-CM

## 2024-09-12 DIAGNOSIS — E66812 Obesity, class 2: Secondary | ICD-10-CM

## 2024-09-12 DIAGNOSIS — K7401 Hepatic fibrosis, early fibrosis: Secondary | ICD-10-CM

## 2024-09-12 DIAGNOSIS — K760 Fatty (change of) liver, not elsewhere classified: Secondary | ICD-10-CM

## 2024-09-12 DIAGNOSIS — R7303 Prediabetes: Secondary | ICD-10-CM | POA: Diagnosis not present

## 2024-09-12 DIAGNOSIS — G4733 Obstructive sleep apnea (adult) (pediatric): Secondary | ICD-10-CM | POA: Diagnosis not present

## 2024-09-12 DIAGNOSIS — K754 Autoimmune hepatitis: Secondary | ICD-10-CM

## 2024-09-12 MED ORDER — METFORMIN HCL 500 MG PO TABS
1000.0000 mg | ORAL_TABLET | Freq: Every day | ORAL | 0 refills | Status: DC
  Filled 2024-09-12: qty 90, 45d supply, fill #0

## 2024-09-12 MED ORDER — TIRZEPATIDE-WEIGHT MANAGEMENT 10 MG/0.5ML ~~LOC~~ SOAJ
10.0000 mg | SUBCUTANEOUS | 0 refills | Status: DC
  Filled 2024-09-12: qty 2, 28d supply, fill #0

## 2024-09-12 NOTE — Progress Notes (Signed)
 WEIGHT SUMMARY AND BIOMETRICS  Vitals Temp: 97.6 F (36.4 C) BP: 111/74 Pulse Rate: 68 SpO2: 98 %   Anthropometric Measurements Height: 5' 11 (1.803 m) Weight: 237 lb (107.5 kg) BMI (Calculated): 33.07 Weight at Last Visit: 243lb Weight Lost Since Last Visit: 6lb Weight Gained Since Last Visit: 0 Starting Weight: 266lb Total Weight Loss (lbs): 29 lb (13.2 kg)   Body Composition  Body Fat %: 34.7 % Fat Mass (lbs): 82.6 lbs Muscle Mass (lbs): 147.6 lbs Total Body Water  (lbs): 108 lbs Visceral Fat Rating : 21   Other Clinical Data Fasting: yes Labs: no Today's Visit #: 30 Starting Date: 12/29/20    Chief Complaint:   OBESITY [name] is here to discuss his progress with his obesity treatment plan.  He is on the the Category 3 Plan and states he is following his eating plan approximately 60 % of the time.  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  Interim History:  He was started on weekly Zepboound 2.5mg  on/about 05/19/2024 Zepbound  2.5mg  increased to 5mg  end of June 2025 07/11/24 Zepbound   5mg  increased to 7.5mg   Hunger/appetite-He reports significant polyphagia in late afternoon.  Stress- life is stable  Exercise-Swimming and Strength Training  Subjective:   1. OSA (obstructive sleep apnea) He endorses consistent use of nightly CPAP He denies daytime fatigue  2. NAFLD (nonalcoholic fatty liver disease) 2/89/7975 Narrative & Impression  CLINICAL DATA:  Elevated LFTs   EXAM: ULTRASOUND ABDOMEN LIMITED RIGHT UPPER QUADRANT   COMPARISON:  None Available.   FINDINGS: Gallbladder:   No gallstones or wall thickening visualized. No sonographic Murphy sign noted by sonographer.   Common bile duct:   Diameter: 6 mm, prominent.   Liver:   Increased echogenicity. No focal lesion. Portal vein is patent on color Doppler imaging with normal direction of blood flow towards the liver.   Other:  None.   IMPRESSION: 1. Increased hepatic parenchymal echogenicity suggestive of steatosis. 2. No cholelithiasis or sonographic evidence for acute cholecystitis.    3. Pre-diabetes Lab Results  Component Value Date   HGBA1C 5.7 (H) 07/11/2024   HGBA1C 5.8 (H) 03/16/2024   HGBA1C 6.2 06/17/2023    He is currently on daily Metformin  500mg - 2 tabs at breakfast He is on weekly Zepbound  7.5mg  He reports significant polyphagia in late afternoon. He reports resolution of GI upset, specifically constipation and worsening GERD He is agreeable to increasing Zepbound  from 7.5mg  to 10 mg  Assessment/Plan:   1. OSA (obstructive sleep apnea) (Primary) Refill and INCREASE - tirzepatide  10 MG/0.5ML injection vial; Inject 10 mg into the skin once a week.  Dispense: 2 mL; Refill: 0  2. NAFLD (nonalcoholic fatty liver disease) Refill and INCREASE - tirzepatide  10 MG/0.5ML injection vial; Inject 10 mg into the skin once a week.  Dispense: 2 mL; Refill: 0  3. Pre-diabetes Refill and INCREASE - tirzepatide  10 MG/0.5ML injection vial; Inject 10 mg into the skin once a week.  Dispense: 2 mL; Refill: 0 Refill  metFORMIN  (GLUCOPHAGE ) 500 MG tablet Take 2 tablets (1,000 mg total) by mouth daily with breakfast. Dispense: 90 tablet, Refills: 0 of 0 remaining   4. Obesity, Current BMI 33.2 Refill and INCREASE - tirzepatide  10 MG/0.5ML injection vial; Inject 10 mg into the skin once a week.  Dispense: 2 mL; Refill: 0  Kosta is currently in the action stage of change. As such, his goal is to continue with weight loss  efforts. He has agreed to the Category 3 Plan.   Exercise goals: Older adults should follow the adult guidelines. When older adults cannot meet the adult guidelines, they should be as physically active as their abilities and conditions will allow.  Older adults should do exercises that maintain or improve balance if they are at risk of falling.  Older adults should determine their level of  effort for physical activity relative to their level of fitness.  Older adults with chronic conditions should understand whether and how their conditions affect their ability to do regular physical activity safely.  Behavioral modification strategies: increasing lean protein intake, decreasing simple carbohydrates, increasing vegetables, increasing water  intake, meal planning and cooking strategies, keeping healthy foods in the home, ways to avoid boredom eating, ways to avoid night time snacking, and planning for success.  Jeramia has agreed to follow-up with our clinic in 4 weeks. He was informed of the importance of frequent follow-up visits to maximize his success with intensive lifestyle modifications for his multiple health conditions.   Objective:   Blood pressure 111/74, pulse 68, temperature 97.6 F (36.4 C), height 5' 11 (1.803 m), weight 237 lb (107.5 kg), SpO2 98%. Body mass index is 33.05 kg/m.  General: Cooperative, alert, well developed, in no acute distress. HEENT: Conjunctivae and lids unremarkable. Cardiovascular: Regular rhythm.  Lungs: Normal work of breathing. Neurologic: No focal deficits.   Lab Results  Component Value Date   CREATININE 0.95 07/11/2024   BUN 14 07/11/2024   NA 139 07/11/2024   K 4.8 07/11/2024   CL 104 07/11/2024   CO2 21 07/11/2024   Lab Results  Component Value Date   ALT 21 07/11/2024   AST 26 07/11/2024   ALKPHOS 70 07/11/2024   BILITOT 0.7 07/11/2024   Lab Results  Component Value Date   HGBA1C 5.7 (H) 07/11/2024   HGBA1C 5.8 (H) 03/16/2024   HGBA1C 6.2 06/17/2023   HGBA1C 5.9 (H) 02/24/2023   HGBA1C 5.7 (H) 10/23/2022   Lab Results  Component Value Date   INSULIN  26.7 (H) 07/11/2024   INSULIN  16.0 03/16/2024   INSULIN  15.6 02/24/2023   INSULIN  19.4 10/23/2022   INSULIN  15.2 06/09/2022   Lab Results  Component Value Date   TSH 1.830 07/11/2024   Lab Results  Component Value Date   CHOL 123 06/17/2023   HDL 42.90  06/17/2023   LDLCALC 65 06/17/2023   LDLDIRECT 153.1 06/09/2011   TRIG 75.0 06/17/2023   CHOLHDL 3 06/17/2023   Lab Results  Component Value Date   VD25OH 55.5 07/11/2024   VD25OH 52.0 03/16/2024   VD25OH 61.69 08/14/2023   Lab Results  Component Value Date   WBC 4.1 07/11/2024   HGB 14.0 07/11/2024   HCT 43.3 07/11/2024   MCV 92 07/11/2024   PLT 141 (L) 07/11/2024   Lab Results  Component Value Date   FERRITIN 20.6 (L) 07/21/2023   Attestation Statements:   Reviewed by clinician on day of visit: allergies, medications, problem list, medical history, surgical history, family history, social history, and previous encounter notes.  I have reviewed the above documentation for accuracy and completeness, and I agree with the above. -  Xsavier Seeley d. Harjas Biggins, NP-C

## 2024-10-01 ENCOUNTER — Other Ambulatory Visit (INDEPENDENT_AMBULATORY_CARE_PROVIDER_SITE_OTHER): Payer: Self-pay | Admitting: Adult Health

## 2024-10-01 DIAGNOSIS — R7303 Prediabetes: Secondary | ICD-10-CM

## 2024-10-06 ENCOUNTER — Other Ambulatory Visit: Payer: Self-pay | Admitting: Cardiology

## 2024-10-06 MED ORDER — AMLODIPINE BESYLATE 5 MG PO TABS: 5.0000 mg | ORAL_TABLET | Freq: Every day | ORAL | 0 refills | Status: AC

## 2024-10-06 MED ORDER — METOPROLOL SUCCINATE ER 25 MG PO TB24: 25.0000 mg | ORAL_TABLET | Freq: Every day | ORAL | 0 refills | Status: DC

## 2024-10-06 MED ORDER — ATORVASTATIN CALCIUM 80 MG PO TABS: 80.0000 mg | ORAL_TABLET | Freq: Every day | ORAL | 0 refills | Status: DC

## 2024-10-10 ENCOUNTER — Other Ambulatory Visit (HOSPITAL_COMMUNITY): Payer: Self-pay

## 2024-10-10 ENCOUNTER — Ambulatory Visit (INDEPENDENT_AMBULATORY_CARE_PROVIDER_SITE_OTHER): Admitting: Adult Health

## 2024-10-10 ENCOUNTER — Encounter (INDEPENDENT_AMBULATORY_CARE_PROVIDER_SITE_OTHER): Payer: Self-pay | Admitting: Adult Health

## 2024-10-10 DIAGNOSIS — E66812 Obesity, class 2: Secondary | ICD-10-CM

## 2024-10-10 DIAGNOSIS — E669 Obesity, unspecified: Secondary | ICD-10-CM | POA: Diagnosis not present

## 2024-10-10 DIAGNOSIS — Z6832 Body mass index (BMI) 32.0-32.9, adult: Secondary | ICD-10-CM | POA: Diagnosis not present

## 2024-10-10 DIAGNOSIS — G4733 Obstructive sleep apnea (adult) (pediatric): Secondary | ICD-10-CM

## 2024-10-10 DIAGNOSIS — R7303 Prediabetes: Secondary | ICD-10-CM | POA: Diagnosis not present

## 2024-10-10 DIAGNOSIS — K760 Fatty (change of) liver, not elsewhere classified: Secondary | ICD-10-CM | POA: Diagnosis not present

## 2024-10-10 MED ORDER — METFORMIN HCL 500 MG PO TABS: 1000.0000 mg | ORAL_TABLET | Freq: Every day | ORAL | 0 refills | Status: DC

## 2024-10-10 MED ORDER — TIRZEPATIDE-WEIGHT MANAGEMENT 10 MG/0.5ML ~~LOC~~ SOAJ
10.0000 mg | SUBCUTANEOUS | 0 refills | Status: DC
Start: 2024-10-10 — End: 2024-11-07
  Filled 2024-10-10: qty 2, 28d supply, fill #0

## 2024-10-10 NOTE — Progress Notes (Signed)
 WEIGHT SUMMARY AND BIOMETRICS  Vitals Temp: 97.7 F (36.5 C) BP: 102/66 Pulse Rate: 62 SpO2: 97 %   Anthropometric Measurements Height: 5' 11 (1.803 m) Weight: 230 lb (104.3 kg) BMI (Calculated): 32.09 Weight at Last Visit: 237 lb Weight Lost Since Last Visit: 7 lb Weight Gained Since Last Visit: 0 Starting Weight: 266 lb Total Weight Loss (lbs): 36 lb (16.3 kg)   Body Composition  Body Fat %: 33 % Fat Mass (lbs): 76.4 lbs Muscle Mass (lbs): 146.8 lbs Total Body Water  (lbs): 104.8 lbs Visceral Fat Rating : 20   Other Clinical Data Fasting: no Labs: no Today's Visit #: 31 Starting Date: 12/29/20    Chief Complaint:   OBESITY [name] is here to discuss his progress with his obesity treatment plan.  He is on the the Category 3 Plan and states he is following his eating plan approximately 65 % of the time.  He states he is exercising Swimming 60 minutes 5 times per week.  Interim History:  He was started on weekly Zepboound 2.5mg  on/about 05/19/2024 09/12/24 Zepbound  7.5mg  increased to 10mg  He has had 4 doses Denies mass in neck, dysphagia, dyspepsia, persistent hoarseness, abdominal pain, or N/V/C   Reviewed Bioimpedance Results with pt: Muscle Mass: -0.8 lb Adipose Mass: -6.2 lbs  He endorses recent increase in energy levels and simply just feeling better.  Subjective:   1. Pre-diabetes Lab Results  Component Value Date   HGBA1C 5.7 (H) 07/11/2024   HGBA1C 5.8 (H) 03/16/2024   HGBA1C 6.2 06/17/2023     Latest Reference Range & Units 03/16/24 12:32 07/11/24 08:31  INSULIN  2.6 - 24.9 uIU/mL 16.0 26.7 (H)  (H): Data is abnormally high  2. OSA (obstructive sleep apnea) He was started on weekly Zepboound 2.5mg  on/about 05/19/2024 09/12/24 Zepbound  7.5mg  increased to 10mg  He has had 4 doses Denies mass in neck, dysphagia, dyspepsia, persistent hoarseness, abdominal pain, or N/V/C   He endorses recent increase in energy levels and simply just  feeling better.  3. NAFLD (nonalcoholic fatty liver disease) He denies RUQ pain 09/12/24 Zepbound  7.5mg  increased to 10mg  He has had 4 doses  Assessment/Plan:   1. Pre-diabetes Continue healthy eating and regular exercise. Refill - metFORMIN  (GLUCOPHAGE ) 500 MG tablet; Take 2 tablets (1,000 mg total) by mouth daily with breakfast.  Dispense: 180 tablet; Refill: 0  2. OSA (obstructive sleep apnea) Continue healthy eating and regular exercise. Refill - tirzepatide  (ZEPBOUND ) 10 MG/0.5ML Pen; Inject 10 mg into the skin once a week.  Dispense: 2 mL; Refill: 0  3. NAFLD (nonalcoholic fatty liver disease) Continue healthy eating and regular exercise. Refill - tirzepatide  (ZEPBOUND ) 10 MG/0.5ML Pen; Inject 10 mg into the skin once a week.  Dispense: 2 mL; Refill: 0  4. Obesity, Current BMI 32.2 Continue healthy eating and regular exercise. Refill - tirzepatide  (ZEPBOUND ) 10 MG/0.5ML Pen; Inject 10 mg into the skin once a week.  Dispense: 2 mL; Refill: 0  [name] is currently in the action stage of change. As such, his goal is to continue with weight loss efforts. He has agreed to the Category 3 Plan.   Exercise goals: Older adults should follow the adult guidelines. When older adults cannot meet the adult guidelines, they should be as physically active as their abilities and conditions will allow.  Older adults should do exercises that maintain or improve balance if they are at risk of falling.  Older adults should determine their level of effort for physical activity relative  to their level of fitness.  Older adults with chronic conditions should understand whether and how their conditions affect their ability to do regular physical activity safely.  Behavioral modification strategies: increasing lean protein intake, decreasing simple carbohydrates, increasing vegetables, increasing water  intake, no skipping meals, meal planning and cooking strategies, keeping healthy foods in the home,  ways to avoid boredom eating, and planning for success.  [name] has agreed to follow-up with our clinic in 4 weeks. He was informed of the importance of frequent follow-up visits to maximize his success with intensive lifestyle modifications for his multiple health conditions.   Check Fasting Labs Fall 2025  Objective:   Blood pressure 102/66, pulse 62, temperature 97.7 F (36.5 C), height 5' 11 (1.803 m), weight 230 lb (104.3 kg), SpO2 97%. Body mass index is 32.08 kg/m.  General: Cooperative, alert, well developed, in no acute distress. HEENT: Conjunctivae and lids unremarkable. Cardiovascular: Regular rhythm.  Lungs: Normal work of breathing. Neurologic: No focal deficits.   Lab Results  Component Value Date   CREATININE 0.95 07/11/2024   BUN 14 07/11/2024   NA 139 07/11/2024   K 4.8 07/11/2024   CL 104 07/11/2024   CO2 21 07/11/2024   Lab Results  Component Value Date   ALT 21 07/11/2024   AST 26 07/11/2024   ALKPHOS 70 07/11/2024   BILITOT 0.7 07/11/2024   Lab Results  Component Value Date   HGBA1C 5.7 (H) 07/11/2024   HGBA1C 5.8 (H) 03/16/2024   HGBA1C 6.2 06/17/2023   HGBA1C 5.9 (H) 02/24/2023   HGBA1C 5.7 (H) 10/23/2022   Lab Results  Component Value Date   INSULIN  26.7 (H) 07/11/2024   INSULIN  16.0 03/16/2024   INSULIN  15.6 02/24/2023   INSULIN  19.4 10/23/2022   INSULIN  15.2 06/09/2022   Lab Results  Component Value Date   TSH 1.830 07/11/2024   Lab Results  Component Value Date   CHOL 123 06/17/2023   HDL 42.90 06/17/2023   LDLCALC 65 06/17/2023   LDLDIRECT 153.1 06/09/2011   TRIG 75.0 06/17/2023   CHOLHDL 3 06/17/2023   Lab Results  Component Value Date   VD25OH 55.5 07/11/2024   VD25OH 52.0 03/16/2024   VD25OH 61.69 08/14/2023   Lab Results  Component Value Date   WBC 4.1 07/11/2024   HGB 14.0 07/11/2024   HCT 43.3 07/11/2024   MCV 92 07/11/2024   PLT 141 (L) 07/11/2024   Lab Results  Component Value Date   FERRITIN 20.6  (L) 07/21/2023   Attestation Statements:   Reviewed by clinician on day of visit: allergies, medications, problem list, medical history, surgical history, family history, social history, and previous encounter notes.  I have reviewed the above documentation for accuracy and completeness, and I agree with the above. -  Greysin Medlen d. Annora Guderian, NP-C

## 2024-10-14 ENCOUNTER — Other Ambulatory Visit: Payer: Self-pay | Admitting: Cardiology

## 2024-10-21 ENCOUNTER — Telehealth: Payer: Self-pay | Admitting: Cardiology

## 2024-10-21 ENCOUNTER — Ambulatory Visit (INDEPENDENT_AMBULATORY_CARE_PROVIDER_SITE_OTHER): Admitting: Internal Medicine

## 2024-10-21 VITALS — BP 124/70 | HR 69 | Temp 98.0°F | Ht 71.0 in | Wt 232.0 lb

## 2024-10-21 DIAGNOSIS — E162 Hypoglycemia, unspecified: Secondary | ICD-10-CM | POA: Insufficient documentation

## 2024-10-21 DIAGNOSIS — R42 Dizziness and giddiness: Secondary | ICD-10-CM

## 2024-10-21 DIAGNOSIS — E034 Atrophy of thyroid (acquired): Secondary | ICD-10-CM | POA: Diagnosis not present

## 2024-10-21 DIAGNOSIS — I10 Essential (primary) hypertension: Secondary | ICD-10-CM | POA: Diagnosis not present

## 2024-10-21 DIAGNOSIS — R7303 Prediabetes: Secondary | ICD-10-CM

## 2024-10-21 NOTE — Telephone Encounter (Signed)
 Spoke with pt regarding his symptoms. Pt stated a couple of weeks ago he turned to move a different direction and became very dizzy and had to sit down and became very sweaty. This episode lasted about 10 minutes. Yesterday the pt had a similar experience where he turned and became dizzy and had to hold himself up against the wall and again became very sweaty. Again the episode lasted about 10 minutes. The pt stated a nurse practitioner was there and took his vitals which were all normal. She told the pt that it may be an inner ear issue. The pt also saw his PCP today who also stated it was most likely and inner ear issue. The pt denies any chest pain, shortness of breath, palpitations of flutters. Pt was told to follow up with his PCP regarding the issue but that Dr. Shlomo would be notified. Pt was given ED precautions. Pt verbalized understanding. All questions if any were answered.

## 2024-10-21 NOTE — Assessment & Plan Note (Signed)
 Chronic Blood pressure well controlled Monitors BP at home has also been well-controlled No evidence of hypotension during these episodes-it was checked and both times Continue continue metoprolol  XL 25 mg daily, telmisartan  80 mg daily Continue to monitor BP

## 2024-10-21 NOTE — Patient Instructions (Addendum)
      Follow up with cardiology - Dr Shlomo.      Medications changes include :   None   Your symptoms are possibly related to vertigo - which are likely to occur head movements.     Return if symptoms worsen or fail to improve.

## 2024-10-21 NOTE — Assessment & Plan Note (Signed)
 Chronic Currently on metformin  and Zepbound  He does not always eat regularly and that could be causing some hypoglycemia He will start eating more regularly which he knows is important Defer glucometer at this time, but will let me know if he thinks he needs 1

## 2024-10-21 NOTE — Progress Notes (Signed)
 Subjective:    Patient ID: [name], male    DOB: Sep 04, 1951, 73 y.o.   MRN: [id number]      HPI [name] is here for  Chief Complaint  Patient presents with   Dizziness    Patient has experienced dizziness and sweating x 2 episode; Yesterday was last episode    Turned to- got dizzy - broke out in sweat  - 10 min no pain, no change in vision.    Yesterday - turning and getting dizzy and soon ashe sat down he started to sweat - BP cugff, Oz, pulse normal.  Lated 10 min .     Yesterday did not eat as much  Cold 6 weeks ago - runny nose every morning and sneeze - goes away   Discussed the use of AI scribe software for clinical note transcription with the patient, who gave verbal consent to proceed.  History of Present Illness [name] is a 73 year old male who presents with episodes of dizziness and sweating.  He has experienced two episodes of dizziness and sweating. The first episode occurred a couple of weeks ago while he was in the kitchen. Upon turning to go into the family room, he felt 'real dizzy' and sat down, continuing to feel dizzy and breaking out in a sweat for about ten minutes. There was no pain, vision changes, or other symptoms. His wife checked his blood pressure at the time, but he did not think much of it afterward.  The second episode occurred yesterday while volunteering at church. He felt dizzy again when turning while talking to some ladies. He walked to a bench, keeping his hand on the wall for support. Upon sitting, he began to sweat again. A nurse practitioner checked his blood pressure, pulse, and oxygen  levels, all of which were normal. The episode lasted about ten minutes, after which he felt normal again, though slightly 'spent'.  No chest pain, heart racing, shortness of breath, nausea, headache, sore throat, nasal congestion, sinus pain, drainage, coughing, wheezing, fever, changes in vision, numbness, or tingling. He notes a  history of a cold about six weeks ago, with residual morning rhinorrhea and sneezing that resolves after an hour.  He is currently on Zepbound  since June, with side effects of constipation and heartburn, which he manages. He also takes metformin , initially prescribed as one in the morning and one at noon, but he now takes both in the morning due to missed doses. He mentions a high fiber diet and has lost about thirty-five pounds.  He describes his physical activity, including weight lifting, leaf blowing, swimming, and sauna use. He notes irregular eating patterns, often skipping meals.  He monitors his blood pressure at home, which has been stable. He mentions a history of heart stent placement. He experiences occasional dizziness when standing up quickly, which he manages by standing still for a moment before moving.     Medications and allergies reviewed with patient and updated if appropriate.  Current Outpatient Medications on File Prior to Visit  Medication Sig Dispense Refill   amLODipine  (NORVASC ) 5 MG tablet Take 1 tablet (5 mg total) by mouth daily. 15 tablet 0   aspirin  81 MG chewable tablet Chew 1 tablet (81 mg total) by mouth daily.     atorvastatin  (LIPITOR ) 80 MG tablet Take 1 tablet (80 mg total) by mouth daily. 15 tablet 0   azaTHIOprine  (IMURAN ) 50 MG tablet Take 2 tablets (100 mg total) by  mouth daily. 60 tablet 1   cholecalciferol (VITAMIN D3) 25 MCG (1000 UNIT) tablet Take 1,000 Units by mouth daily.     clopidogrel  (PLAVIX ) 75 MG tablet TAKE 1 TABLET BY MOUTH EVERY DAY 15 tablet 0   finasteride  (PROSCAR ) 5 MG tablet Take 5 mg by mouth daily.     levothyroxine  (SYNTHROID ) 137 MCG tablet TAKE 1 TABLET BY MOUTH DAILY BEFORE BREAKFAST. 90 tablet 3   metFORMIN  (GLUCOPHAGE ) 500 MG tablet Take 2 tablets (1,000 mg total) by mouth daily with breakfast. 180 tablet 0   metoprolol  succinate (TOPROL -XL) 25 MG 24 hr tablet TAKE 1 TABLET (25 MG TOTAL) BY MOUTH DAILY. 15 tablet 0    nitroGLYCERIN  (NITROSTAT ) 0.4 MG SL tablet Place 1 tablet (0.4 mg total) under the tongue every 5 (five) minutes as needed for chest pain. 25 tablet 3   ondansetron  (ZOFRAN ) 4 MG tablet Take 1 tablet (4 mg total) by mouth every 8 (eight) hours as needed for nausea or vomiting. 20 tablet 0   telmisartan  (MICARDIS ) 80 MG tablet TAKE 1 TABLET BY MOUTH EVERY DAY 90 tablet 2   tirzepatide  (ZEPBOUND ) 10 MG/0.5ML Pen Inject 10 mg into the skin once a week. 2 mL 0   No current facility-administered medications on file prior to visit.    Review of Systems  Constitutional:  Positive for diaphoresis. Negative for fever.  HENT:  Positive for rhinorrhea and sneezing. Negative for congestion, postnasal drip, sinus pain and sore throat.   Eyes:  Negative for visual disturbance.  Respiratory:  Negative for cough, shortness of breath and wheezing.   Cardiovascular:  Negative for chest pain and palpitations.  Gastrointestinal:  Negative for nausea.  Neurological:  Positive for dizziness. Negative for numbness and headaches.       Objective:   Vitals:   10/21/24 1508  BP: 124/70  Pulse: 69  Temp: 98 F (36.7 C)  SpO2: 99%   BP Readings from Last 3 Encounters:  10/21/24 124/70  10/10/24 102/66  09/12/24 111/74   Wt Readings from Last 3 Encounters:  10/21/24 232 lb (105.2 kg)  10/10/24 230 lb (104.3 kg)  09/12/24 237 lb (107.5 kg)   Body mass index is 32.36 kg/m.    Physical Exam Constitutional:      General: He is not in acute distress.    Appearance: Normal appearance. He is not ill-appearing.  HENT:     Head: Normocephalic and atraumatic.     Right Ear: Tympanic membrane, ear canal and external ear normal. There is no impacted cerumen.     Left Ear: Tympanic membrane, ear canal and external ear normal. There is no impacted cerumen.     Mouth/Throat:     Mouth: Mucous membranes are moist.     Pharynx: No oropharyngeal exudate or posterior oropharyngeal erythema.  Eyes:      Conjunctiva/sclera: Conjunctivae normal.  Cardiovascular:     Rate and Rhythm: Normal rate and regular rhythm.     Heart sounds: Normal heart sounds.  Pulmonary:     Effort: Pulmonary effort is normal. No respiratory distress.     Breath sounds: Normal breath sounds. No wheezing or rales.  Musculoskeletal:     Cervical back: Neck supple. No tenderness.     Right lower leg: No edema.     Left lower leg: No edema.  Lymphadenopathy:     Cervical: No cervical adenopathy.  Skin:    General: Skin is warm and dry.     Findings: No rash.  Neurological:     Mental Status: He is alert. Mental status is at baseline.  Psychiatric:        Mood and Affect: Mood normal.            Assessment & Plan:    See Problem List for Assessment and Plan of chronic medical problems.

## 2024-10-21 NOTE — Assessment & Plan Note (Signed)
 Acute The past couple of weeks he has had 2 episodes of dizziness Associated with sweating Episodes last about 10 minutes Both episodes occurred with turning so vertigo-BPPV is possible Blood pressure check after both episodes and was normal Hypoglycemia less likely No symptoms during regular exercise-lifting weights, swimming still unlikely to be ischemia Arrhythmia possible, but seems less likely-overdue for cardiology appointment-he will call to schedule Could be related to recent cold [facility] so we will hold off on blood work today He will monitor closely and let me know if he has another episode Can consider vestibular PT

## 2024-10-21 NOTE — Assessment & Plan Note (Signed)
 Chronic  Clinically euthyroid Unlikely to be related to current symptoms Continue levothyroxine  137 mcg daily

## 2024-10-21 NOTE — Telephone Encounter (Signed)
 STAT if patient feels like he/she is going to faint   1. Are you feeling dizzy, lightheaded, or faint right now?   No  2. Have you passed out?    No   (If yes move to .SYNCOPECHMG)   3. Do you have any other symptoms?   No   4. Have you checked your HR and BP (record if available)?   Wife stated patient's BP and HR were fine yesterday and today.   Wife is concerned patient had and episode of dizziness/vertigo yesterday evening and wants advice on next steps.  Wife stated can call the patient back directly at [phone].

## 2024-10-22 ENCOUNTER — Other Ambulatory Visit: Payer: Self-pay | Admitting: Cardiology

## 2024-10-22 ENCOUNTER — Encounter: Payer: Self-pay | Admitting: Internal Medicine

## 2024-10-22 DIAGNOSIS — R42 Dizziness and giddiness: Secondary | ICD-10-CM

## 2024-10-26 NOTE — Telephone Encounter (Signed)
 Call to patient to advise Dr. Shlomo recommends carotid/vertebral dopplers. Patient agrees to plan and states he also has an PT evaluation Monday for vertigo.

## 2024-10-31 ENCOUNTER — Ambulatory Visit: Attending: Internal Medicine | Admitting: Physical Therapy

## 2024-10-31 ENCOUNTER — Encounter: Payer: Self-pay | Admitting: Physical Therapy

## 2024-10-31 DIAGNOSIS — R42 Dizziness and giddiness: Secondary | ICD-10-CM | POA: Insufficient documentation

## 2024-10-31 NOTE — Therapy (Deleted)
 OUTPATIENT PHYSICAL THERAPY NEURO EVALUATION   Patient Name: [name] MRN: [id number] DOB:[birth year], 73 y.o., male Today's Date: 10/31/2024   PCP: *** REFERRING PROVIDER: ***  END OF SESSION:   Past Medical History:  Diagnosis Date   Back pain    BPH (benign prostatic hypertrophy)    Chest pain    COLONIC POLYPS, HX OF 2007   clear colo w/o polyps 04/2015: 17yr follow up   Constipation    Coronary artery disease    Decreased mobility    due to BL knee and hip replacement   Fatigue    GERD (gastroesophageal reflux disease)    Heartburn    History of kidney stones    passed   Hyperlipidemia    HYPERTENSION    HYPOTHYROIDISM    Joint pain    Myocardial infarction (HCC) 12/2018   Inferior STEMI   OA (osteoarthritis)    Knees   OBESITY    OSA treated with BiPAP    PAF (paroxysmal atrial fibrillation) (HCC)    Pneumonia    Pre-diabetes    Shortness of breath on exertion    Umbilical hernia    Ventral hernia    Past Surgical History:  Procedure Laterality Date   arthroscopic knee surgery     (R) 2003 & (L) 2010   CARDIAC CATHETERIZATION     COLONOSCOPY  04/2015   no polyps ([facility])   CORONARY/GRAFT ACUTE MI REVASCULARIZATION N/A 12/30/2018   Procedure: Coronary/Graft Acute MI Revascularization;  Surgeon: Claudene Victory ORN, MD;  Location: [facility];  Service: Cardiovascular;  Laterality: N/A;   HIP CLOSED REDUCTION Right 03/12/2021   Procedure: CLOSED REDUCTION HIP;  Surgeon: Burnetta Aures, MD;  Location: [facility];  Service: Orthopedics;  Laterality: Right;   LAMINECTOMY  1991   LEFT HEART CATH AND CORONARY ANGIOGRAPHY N/A 12/30/2018   Procedure: LEFT HEART CATH AND CORONARY ANGIOGRAPHY;  Surgeon: Claudene Victory ORN, MD;  Location: [facility];  Service: Cardiovascular;  Laterality: N/A;   TONSILLECTOMY  1990   w/ adenoids and uvula   TONSILLECTOMY     TOTAL HIP ARTHROPLASTY Right 01/2011   [facility]   TOTAL HIP ARTHROPLASTY Left 04/16/2016    Procedure: TOTAL HIP ARTHROPLASTY ANTERIOR APPROACH;  Surgeon: Dempsey Moan, MD;  Location: [facility];  Service: Orthopedics;  Laterality: Left;   TOTAL HIP REVISION Right 01/04/2020   Procedure: Right hip bearing surface;  Surgeon: Moan Dempsey, MD;  Location: [facility];  Service: Orthopedics;  Laterality: Right;    TOTAL HIP REVISION Right 04/15/2021   Procedure: Right hip bearing surface revision;  Surgeon: Moan Dempsey, MD;  Location: [facility];  Service: Orthopedics;  Laterality: Right;    TOTAL KNEE ARTHROPLASTY  12/27/2012   Procedure: TOTAL KNEE BILATERAL;  Surgeon: Dempsey LULLA Moan, MD;  Location: [facility];  Service: Orthopedics;  Laterality: Bilateral;   UMBILICAL HERNIA REPAIR N/A 03/11/2023   Procedure: HERNIA REPAIR UMBILICAL ADULT;  Surgeon: Alvaro Hummer, MD;  Location: [facility];  Service: Urology;  Laterality: N/A;   WISDOM TOOTH EXTRACTION     XI ROBOTIC ASSISTED SIMPLE PROSTATECTOMY N/A 03/11/2023   Procedure: XI ROBOTIC ASSISTED SIMPLE PROSTATECTOMY;  Surgeon: Alvaro Hummer, MD;  Location: [facility];  Service: Urology;  Laterality: N/A;  3 HRS   Patient Active Problem List   Diagnosis Date Noted   Autoimmune hepatitis (HCC) 06/16/2024   Erythema multiforme 07/03/2023   BPH with obstruction/lower urinary tract symptoms 03/11/2023   Thrombocytopenia 12/18/2022  Pneumonia of right upper lobe due to infectious organism 12/18/2022   Acute cough 11/28/2022   Degeneration of lumbar intervertebral disc 08/19/2022   Lumbar spondylosis 08/19/2022   IFG (impaired fasting glucose) 06/11/2022   COVID-19 virus infection 05/28/2021   Recurrent dislocation of right hip 04/15/2021   Hip dislocation, right (HCC) 03/12/2021   Bradycardia 03/12/2021   NAFLD (nonalcoholic fatty liver disease) 98/93/7977   Other fatigue 01/01/2021   Shortness of breath on exertion 01/01/2021   History of ST elevation myocardial infarction (STEMI) 01/01/2021   Vitamin D  deficiency 01/01/2021   Strain  of flexor muscle of left hip 07/12/2020   Pain in left knee 02/21/2020   Pain in right knee 02/21/2020   Pain of left hip joint 02/21/2020   Failed total hip arthroplasty 01/04/2020   Mechanical failure of prosthetic joint 01/04/2020   OSA treated with BiPAP 05/03/2019   CAD S/P percutaneous coronary angioplasty 01/11/2019   Dyslipidemia 01/11/2019   ST elevation myocardial infarction (STEMI) (HCC) 12/30/2018   Prediabetes 05/11/2017   Ventral hernia without obstruction or gangrene 05/11/2017   Umbilical hernia without obstruction or gangrene 05/11/2017   OA (osteoarthritis) of hip 04/16/2016   Dizziness 01/03/2016   Knee joint replacement status 01/12/2013   OA (osteoarthritis) of knee 12/27/2012   Hypothyroidism 09/04/2009   Obesity 09/04/2009   Essential hypertension 09/04/2009   History of colonic polyps 09/04/2009    ONSET DATE: ***  REFERRING DIAG:  Diagnosis  R42 (ICD-10-CM) - Vertigo    THERAPY DIAG:  No diagnosis found.  Rationale for Evaluation and Treatment: Rehabilitation  SUBJECTIVE:                                                                                                                                                                                             SUBJECTIVE STATEMENT: Pt reports he has had 2 episodes of dizziness - 1 in kitchen when he turned and 2nd one at church when he was in kitchen volunteering and he turned; says the episodes he had perspiration and extreme dizziness; says he has intermittent light-headedness and foggy feeling. Pt reports he had been very active the morning of the 2nd episodes Pt accompanied by: {accompnied:27141}  PERTINENT HISTORY: ***  PAIN:  Are you having pain? No  PRECAUTIONS: {Therapy precautions:24002}  RED FLAGS: {PT Red Flags:29287}   WEIGHT BEARING RESTRICTIONS: {Yes ***/No:24003}  FALLS: Has patient fallen in last 6 months? {fallsyesno:27318}  LIVING ENVIRONMENT: Lives with: {OPRC lives  with:25569::lives with their family} Lives in: {Lives in:25570} Stairs: {opstairs:27293} Has following equipment at home: {Assistive devices:23999}  PLOF: {PLOF:24004}  PATIENT GOALS: ***  OBJECTIVE:  Note: Objective measures were completed at Evaluation unless otherwise noted.  DIAGNOSTIC FINDINGS: ***  COGNITION: Overall cognitive status: {cognition:24006}   SENSATION: {sensation:27233}  COORDINATION: ***  EDEMA:  {edema:24020}  MUSCLE TONE: {LE tone:25568}  MUSCLE LENGTH: Hamstrings: Right *** deg; Left *** deg Debby test: Right *** deg; Left *** deg  DTRs:  {DTR SITE:24025}  POSTURE: {posture:25561}  LOWER EXTREMITY ROM:     {AROM/PROM:27142}  Right Eval Left Eval  Hip flexion    Hip extension    Hip abduction    Hip adduction    Hip internal rotation    Hip external rotation    Knee flexion    Knee extension    Ankle dorsiflexion    Ankle plantarflexion    Ankle inversion    Ankle eversion     (Blank rows = not tested)  LOWER EXTREMITY MMT:    MMT Right Eval Left Eval  Hip flexion    Hip extension    Hip abduction    Hip adduction    Hip internal rotation    Hip external rotation    Knee flexion    Knee extension    Ankle dorsiflexion    Ankle plantarflexion    Ankle inversion    Ankle eversion    (Blank rows = not tested)  BED MOBILITY:  {bed mobility:32615:p}  TRANSFERS: {transfers eval:32620}  RAMP:  {ramp eval:32616}  CURB:  {curb eval:32617}  STAIRS: {stairs eval:32618} GAIT: Findings: {GaitneuroPT:32644::Distance walked: ***,Comments: ***}  FUNCTIONAL TESTS:  {Functional tests:24029}  PATIENT SURVEYS:  {rehab surveys:24030}                                                                                                                              TREATMENT DATE: ***    PATIENT EDUCATION: Education details: *** Person educated: {Person educated:25204} Education method: {Education  Method:25205} Education comprehension: {Education Comprehension:25206}  HOME EXERCISE PROGRAM: ***  GOALS: Goals reviewed with patient? {yes/no:20286}  SHORT TERM GOALS: Target date: ***  *** Baseline: Goal status: INITIAL  2.  *** Baseline:  Goal status: INITIAL  3.  *** Baseline:  Goal status: INITIAL  4.  *** Baseline:  Goal status: INITIAL  5.  *** Baseline:  Goal status: INITIAL  6.  *** Baseline:  Goal status: INITIAL  LONG TERM GOALS: Target date: ***  *** Baseline:  Goal status: INITIAL  2.  *** Baseline:  Goal status: INITIAL  3.  *** Baseline:  Goal status: INITIAL  4.  *** Baseline:  Goal status: INITIAL  5.  *** Baseline:  Goal status: INITIAL  6.  *** Baseline:  Goal status: INITIAL  ASSESSMENT:  CLINICAL IMPRESSION: Patient is a *** y.o. *** who was seen today for physical therapy evaluation and treatment for ***.   OBJECTIVE IMPAIRMENTS: {opptimpairments:25111}.   ACTIVITY LIMITATIONS: {activitylimitations:27494}  PARTICIPATION LIMITATIONS: {participationrestrictions:25113}  PERSONAL FACTORS: {Personal factors:25162} are also affecting patient's functional outcome.   REHAB POTENTIAL: {rehabpotential:25112}  CLINICAL DECISION MAKING: {clinical decision making:25114}  EVALUATION COMPLEXITY: {Evaluation complexity:25115}  PLAN:  PT FREQUENCY: {rehab frequency:25116}  PT DURATION: {rehab duration:25117}  PLANNED INTERVENTIONS: {rehab planned interventions:25118::97110-Therapeutic exercises,97530- Therapeutic [phone]- Neuromuscular re-education,97535- Self Rjmz,02859- Manual therapy,Patient/Family education}  PLAN FOR NEXT SESSION: ***   Roxanna [place], PT 10/31/2024, 7:57 AM

## 2024-10-31 NOTE — Therapy (Signed)
 OUTPATIENT PHYSICAL THERAPY VESTIBULAR EVALUATION     Patient Name: [name] MRN: [id number] DOB:1951-06-20, 73 y.o., male Today's Date: 10/31/2024  END OF SESSION:  PT End of Session - 10/31/24 1410     Visit Number 1    Number of Visits 1    Authorization Type Medicare    Authorization Time Period 10-31-24 - 11-30-24    PT Start Time 0800    PT Stop Time 0845    PT Time Calculation (min) 45 min    Activity Tolerance Patient tolerated treatment well    Behavior During Therapy [facility] for tasks assessed/performed          Past Medical History:  Diagnosis Date   Back pain    BPH (benign prostatic hypertrophy)    Chest pain    COLONIC POLYPS, HX OF 2007   clear colo w/o polyps 04/2015: 17yr follow up   Constipation    Coronary artery disease    Decreased mobility    due to BL knee and hip replacement   Fatigue    GERD (gastroesophageal reflux disease)    Heartburn    History of kidney stones    passed   Hyperlipidemia    HYPERTENSION    HYPOTHYROIDISM    Joint pain    Myocardial infarction (HCC) 12/2018   Inferior STEMI   OA (osteoarthritis)    Knees   OBESITY    OSA treated with BiPAP    PAF (paroxysmal atrial fibrillation) (HCC)    Pneumonia    Pre-diabetes    Shortness of breath on exertion    Umbilical hernia    Ventral hernia    Past Surgical History:  Procedure Laterality Date   arthroscopic knee surgery     (R) 2003 & (L) 2010   CARDIAC CATHETERIZATION     COLONOSCOPY  04/2015   no polyps ([facility])   CORONARY/GRAFT ACUTE MI REVASCULARIZATION N/A 12/30/2018   Procedure: Coronary/Graft Acute MI Revascularization;  Surgeon: Claudene Victory ORN, MD;  [facility]: [facility];  Service: Cardiovascular;  Laterality: N/A;   HIP CLOSED REDUCTION Right 03/12/2021   Procedure: CLOSED REDUCTION HIP;  Surgeon: Burnetta Aures, MD;  Location: [facility];  Service: Orthopedics;  Laterality: Right;   LAMINECTOMY  1991   LEFT HEART CATH AND CORONARY ANGIOGRAPHY N/A  12/30/2018   Procedure: LEFT HEART CATH AND CORONARY ANGIOGRAPHY;  Surgeon: Claudene Victory ORN, MD;  Location: [facility];  Service: Cardiovascular;  Laterality: N/A;   TONSILLECTOMY  1990   w/ adenoids and uvula   TONSILLECTOMY     TOTAL HIP ARTHROPLASTY Right 01/2011   [facility]   TOTAL HIP ARTHROPLASTY Left 04/16/2016   Procedure: TOTAL HIP ARTHROPLASTY ANTERIOR APPROACH;  Surgeon: Dempsey Moan, MD;  Location: [facility];  Service: Orthopedics;  Laterality: Left;   TOTAL HIP REVISION Right 01/04/2020   Procedure: Right hip bearing surface;  Surgeon: Moan Dempsey, MD;  Location: [facility];  Service: Orthopedics;  Laterality: Right;    TOTAL HIP REVISION Right 04/15/2021   Procedure: Right hip bearing surface revision;  Surgeon: Moan Dempsey, MD;  Location: [facility];  Service: Orthopedics;  Laterality: Right;    TOTAL KNEE ARTHROPLASTY  12/27/2012   Procedure: TOTAL KNEE BILATERAL;  Surgeon: Dempsey LULLA Moan, MD;  Location: [facility];  Service: Orthopedics;  Laterality: Bilateral;   UMBILICAL HERNIA REPAIR N/A 03/11/2023   Procedure: HERNIA REPAIR UMBILICAL ADULT;  Surgeon: Alvaro Hummer, MD;  Location: [facility];  Service:  Urology;  Laterality: N/A;   WISDOM TOOTH EXTRACTION     XI ROBOTIC ASSISTED SIMPLE PROSTATECTOMY N/A 03/11/2023   Procedure: XI ROBOTIC ASSISTED SIMPLE PROSTATECTOMY;  Surgeon: Alvaro Hummer, MD;  Location: [facility];  Service: Urology;  Laterality: N/A;  3 HRS   Patient Active Problem List   Diagnosis Date Noted   Autoimmune hepatitis (HCC) 06/16/2024   Erythema multiforme 07/03/2023   BPH with obstruction/lower urinary tract symptoms 03/11/2023   Thrombocytopenia 12/18/2022   Pneumonia of right upper lobe due to infectious organism 12/18/2022   Acute cough 11/28/2022   Degeneration of lumbar intervertebral disc 08/19/2022   Lumbar spondylosis 08/19/2022   IFG (impaired fasting glucose) 06/11/2022   COVID-19 virus infection 05/28/2021   Recurrent dislocation  of right hip 04/15/2021   Hip dislocation, right (HCC) 03/12/2021   Bradycardia 03/12/2021   NAFLD (nonalcoholic fatty liver disease) 98/93/7977   Other fatigue 01/01/2021   Shortness of breath on exertion 01/01/2021   History of ST elevation myocardial infarction (STEMI) 01/01/2021   Vitamin D  deficiency 01/01/2021   Strain of flexor muscle of left hip 07/12/2020   Pain in left knee 02/21/2020   Pain in right knee 02/21/2020   Pain of left hip joint 02/21/2020   Failed total hip arthroplasty 01/04/2020   Mechanical failure of prosthetic joint 01/04/2020   OSA treated with BiPAP 05/03/2019   CAD S/P percutaneous coronary angioplasty 01/11/2019   Dyslipidemia 01/11/2019   ST elevation myocardial infarction (STEMI) (HCC) 12/30/2018   Prediabetes 05/11/2017   Ventral hernia without obstruction or gangrene 05/11/2017   Umbilical hernia without obstruction or gangrene 05/11/2017   OA (osteoarthritis) of hip 04/16/2016   Dizziness 01/03/2016   Knee joint replacement status 01/12/2013   OA (osteoarthritis) of knee 12/27/2012   Hypothyroidism 09/04/2009   Obesity 09/04/2009   Essential hypertension 09/04/2009   History of colonic polyps 09/04/2009    PCP: Geofm Glade PARAS, MD REFERRING PROVIDER: Geofm Glade PARAS, MD  REFERRING DIAG:  Diagnosis  R42 (ICD-10-CM) - Vertigo    THERAPY DIAG:  Dizziness and giddiness  ONSET DATE: early Oct. 2025  Rationale for Evaluation and Treatment: Rehabilitation  SUBJECTIVE:   SUBJECTIVE STATEMENT Pt reports he has had 2 episodes of dizziness - 1 in kitchen in early Oct. when he turned around and 2nd one at church about 2 weeks ago when he was in kitchen volunteering and he turned around.  Pt reports with each episode he had extreme perspiration and dizziness.  Says he has intermittent light-headedness and foggy feeling. Pt reports he had been very active the morning of the 2nd episode and possibly had not eaten very much that day.  Pt reports no  dizziness at today's evaluation.   Pt accompanied by: self  PERTINENT HISTORY: h/o MI Jan. 2020; PAF, pre-diabetes, HTN  Per chart note Dreux Mcgroarty is a 73 year old male who presents with episodes of dizziness and sweating.   He has experienced two episodes of dizziness and sweating. The first episode occurred a couple of weeks ago while he was in the kitchen. Upon turning to go into the family room, he felt 'real dizzy' and sat down, continuing to feel dizzy and breaking out in a sweat for about ten minutes. There was no pain, vision changes, or other symptoms. His wife checked his blood pressure at the time, but he did not think much of it afterward.   The second episode occurred yesterday while volunteering at church. He felt dizzy  again when turning while talking to some ladies. He walked to a bench, keeping his hand on the wall for support. Upon sitting, he began to sweat again. A nurse practitioner checked his blood pressure, pulse, and oxygen  levels, all of which were normal. The episode lasted about ten minutes, after which he felt normal again, though slightly 'spent'.   No chest pain, heart racing, shortness of breath, nausea, headache, sore throat, nasal congestion, sinus pain, drainage, coughing, wheezing, fever, changes in vision, numbness, or tingling. He notes a history of a cold about six weeks ago, with residual morning rhinorrhea and sneezing that resolves after an hour.    PAIN:  Are you having pain? No  PRECAUTIONS: None  RED FLAGS: None   WEIGHT BEARING RESTRICTIONS: No  FALLS: Has patient fallen in last 6 months? No  LIVING ENVIRONMENT: Lives with: lives with their spouse Lives in: House/apartment   PLOF: Independent  PATIENT GOALS: resolve the dizziness   OBJECTIVE:  Note: Objective measures were completed at Evaluation unless otherwise noted.  DIAGNOSTIC FINDINGS: N/A   GAIT: Gait pattern: WFL Distance walked: 25' Assistive  device utilized: None Level of assistance: Complete Independence Comments: no dizziness reported at time of eval  VESTIBULAR ASSESSMENT:  GENERAL OBSERVATION: pt is a 73 yr. Old gentleman amb. Independently without device - reports no dizziness at this time   SYMPTOM BEHAVIOR:  Subjective history: see above  Non-Vestibular symptoms: nausea/vomiting and profuse sweating - pt describes diaphoresis during the 2 previous vertiginous episodes  Type of dizziness: Spinning/Vertigo, Unsteady with head/body turns, and Lightheadedness/Faint  Frequency: has occurred during the 2 episodes - 1) in early Oct. 2) in mid-Oct. - has NOT occurred since   Duration: approx. 10   Aggravating factors: Induced by motion: turning body quickly - pt reports he was turning (thinks it was toward his Lt side) when the 2 episodes occurred   Relieving factors: rest, slow movements, and sitting down  Progression of symptoms: better  OCULOMOTOR EXAM:  Ocular Alignment: normal  Ocular ROM: No Limitations  Spontaneous Nystagmus: absent  Gaze-Induced Nystagmus: absent  Smooth Pursuits: intact  Saccades: intact  VESTIBULAR - OCULAR REFLEX:    VOR Cancellation: Normal    POSITIONAL TESTING: Right Dix-Hallpike: no nystagmus Left Dix-Hallpike: no nystagmus  MOTION SENSITIVITY:  Motion Sensitivity Quotient Intensity: 0 = none, 1 = Lightheaded, 2 = Mild, 3 = Moderate, 4 = Severe, 5 = Vomiting  Intensity  1. Sitting to supine   2. Supine to L side   3. Supine to R side   4. Supine to sitting 1  5. L Hallpike-Dix 0  6. Up from L  1  7. R Hallpike-Dix 0  8. Up from R  1  9. Sitting, head tipped to L knee   10. Head up from L knee   11. Sitting, head tipped to R knee   12. Head up from R knee   13. Sitting head turns x5   14.Sitting head nods x5   15. In stance, 180 turn to L  0  16. In stance, 180 turn to R 0    OTHOSTATICS: not done   mCTSIB - 30 secs on all 4 conditions - no LOB occurred on  conditions 3 or 4  TREATMENT DATE: 10-31-24  Self Care - see below; pt's symptom appear to be more consistent with low blood sugar levels (hypoglycemia) rather than of true vestibular system dysfunction.   Recommended to pt that he eat meals on regular basis and stay well hydrated to see if this results in dizziness remaining to be resolved.   PATIENT EDUCATION: Education details: eval results - no dizziness provoked during today's initial eval;  oculomotor tests WNL's;  all positional testing WNL's with no nystagmus noted and no c/o dizziness; mCTSIB WNL's:  explained to pt that no signs noted in today's eval that indicate vestibular  Person educated: Patient Education method: Explanation Education comprehension: verbalized understanding  HOME EXERCISE PROGRAM:  GOALS: Goals reviewed with patient? N/A - eval only  SHORT TERM GOALS:   N/A   LONG TERM GOALS:  N/A  ASSESSMENT:  CLINICAL IMPRESSION: Patient is a 73 y.o. gentleman who was seen today for physical therapy evaluation and treatment for vertigo.  All positional testing was negative with no nystagmus and no c/o vertigo in any test position or with any movement, in attempt to provoke dizziness in today's evaluation.  No signs of BPPV present.  Pt's description of the 2 vertiginous episodes are more consistent with possible hypoglycemia as pt describes spontaneous dizziness with profuse sweating, with episode lasting approx. 10.  Both of these episodes occurred during the day when he was turning, one being in kitchen at his home and the other in kitchen at church.  Pt able to maintain balance on all 4 conditions of mCTSIB - demonstrating normal vestibular input in maintaining balance.  Pt's c/o dizziness are not consistent with vestibular system dysfunction - etiology of dizziness unknown but appears to be  nonvestibular in etiology (possible hypoglycemic episodes?).   OBJECTIVE IMPAIRMENTS: N/A.   ACTIVITY LIMITATIONS: N/A  PARTICIPATION LIMITATIONS: N/A  PERSONAL FACTORS: N/A are also affecting patient's functional outcome.   REHAB POTENTIAL:  N/A - eval only  CLINICAL DECISION MAKING: Evolving/moderate complexity  EVALUATION COMPLEXITY: Moderate   PLAN:  PT FREQUENCY: one time visit  PT DURATION: 1 week  PLANNED INTERVENTIONS: 97535- Self Care and Initial evaluation  PLAN FOR NEXT SESSION: N/A - eval only as pt has no dizziness at this time; chart will be left open - pt instructed to call for appt if vertigo re-occurs   Veleta Yamamoto, [place], PT 10/31/2024, 2:13 PM

## 2024-11-02 ENCOUNTER — Ambulatory Visit (HOSPITAL_COMMUNITY)
Admission: RE | Admit: 2024-11-02 | Discharge: 2024-11-02 | Disposition: A | Discharge status: Home / Self Care | Source: Ambulatory Visit | Attending: Cardiology | Admitting: Cardiology

## 2024-11-02 DIAGNOSIS — R42 Dizziness and giddiness: Secondary | ICD-10-CM | POA: Insufficient documentation

## 2024-11-02 MED ORDER — DEXCOM G7 RECEIVER DEVI: 0 refills | Status: AC

## 2024-11-02 MED ORDER — DEXCOM G7 SENSOR MISC: 5 refills | Status: AC

## 2024-11-02 NOTE — Addendum Note (Signed)
 Addended by: GEOFM GLADE PARAS on: 11/02/2024 09:37 PM   Modules accepted: Orders

## 2024-11-03 ENCOUNTER — Ambulatory Visit: Payer: Self-pay | Admitting: Cardiology

## 2024-11-05 ENCOUNTER — Other Ambulatory Visit: Payer: Self-pay | Admitting: Internal Medicine

## 2024-11-07 ENCOUNTER — Other Ambulatory Visit (INDEPENDENT_AMBULATORY_CARE_PROVIDER_SITE_OTHER): Payer: Self-pay | Admitting: Adult Health

## 2024-11-07 ENCOUNTER — Other Ambulatory Visit (HOSPITAL_COMMUNITY): Payer: Self-pay

## 2024-11-07 ENCOUNTER — Encounter (INDEPENDENT_AMBULATORY_CARE_PROVIDER_SITE_OTHER): Payer: Self-pay | Admitting: Adult Health

## 2024-11-07 ENCOUNTER — Ambulatory Visit (INDEPENDENT_AMBULATORY_CARE_PROVIDER_SITE_OTHER): Payer: Self-pay | Admitting: Adult Health

## 2024-11-07 VITALS — BP 110/67 | HR 63 | Temp 98.0°F | Ht 71.0 in | Wt 226.0 lb

## 2024-11-07 DIAGNOSIS — E669 Obesity, unspecified: Secondary | ICD-10-CM | POA: Diagnosis not present

## 2024-11-07 DIAGNOSIS — Z6837 Body mass index (BMI) 37.0-37.9, adult: Secondary | ICD-10-CM

## 2024-11-07 DIAGNOSIS — K760 Fatty (change of) liver, not elsewhere classified: Secondary | ICD-10-CM

## 2024-11-07 DIAGNOSIS — E559 Vitamin D deficiency, unspecified: Secondary | ICD-10-CM | POA: Diagnosis not present

## 2024-11-07 DIAGNOSIS — Z6831 Body mass index (BMI) 31.0-31.9, adult: Secondary | ICD-10-CM

## 2024-11-07 DIAGNOSIS — R7303 Prediabetes: Secondary | ICD-10-CM | POA: Diagnosis not present

## 2024-11-07 MED ORDER — METFORMIN HCL 500 MG PO TABS: 1000.0000 mg | ORAL_TABLET | Freq: Every day | ORAL | 0 refills | Status: DC

## 2024-11-07 MED ORDER — TIRZEPATIDE-WEIGHT MANAGEMENT 10 MG/0.5ML ~~LOC~~ SOAJ
10.0000 mg | SUBCUTANEOUS | 0 refills | Status: DC
  Filled 2024-11-07: qty 2, 28d supply, fill #0

## 2024-11-07 NOTE — Progress Notes (Addendum)
 WEIGHT SUMMARY AND BIOMETRICS  Vitals Temp: 98 F (36.7 C) BP: 110/67 Pulse Rate: 63 SpO2: 98 %   Anthropometric Measurements Height: 5' 11 (1.803 m) Weight: 226 lb (102.5 kg) BMI (Calculated): 31.53 Weight at Last Visit: 230 lb Weight Lost Since Last Visit: 4 lb Weight Gained Since Last Visit: 0 Starting Weight: 266 lb Total Weight Loss (lbs): 40 lb (18.1 kg)   Body Composition  Body Fat %: 32.6 % Fat Mass (lbs): 73.6 lbs Muscle Mass (lbs): 144.8 lbs Total Body Water  (lbs): 102.2 lbs Visceral Fat Rating : 19   Other Clinical Data Fasting: yes Labs: yes Today's Visit #: 32 Starting Date: 12/29/20    Chief Complaint:   OBESITY [name] is here to discuss his progress with his obesity treatment plan.  He is on the the Category 3 Plan and states he is following his eating plan approximately 75 % of the time.  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   Interim History:  Home BP readings: SBP: 120s DBP: 60-70s He denies CP with exertion He reports two discrete episodes of dizziness and fatigue- both self resolved. Vertigo: Negative Work Up Carotid US : Negative PCP ordered Continuous Blood Glucose Monitoring system   He was started on weekly Zepboound 2.5mg  on/about 05/19/2024 Zepbound  2.5mg  increased to 5mg  end of June 2025 07/11/24 Zep 5mg  increased to 7.5mg  09/12/24 Zep 7.5mg  increased to 10mg  Denies mass in neck, dysphagia, dyspepsia, persistent hoarseness, abdominal pain, or N/V/C  He is also on daily Metformin  500mg - 2 tabs each morning  Subjective:   1. Pre-diabetes Lab Results  Component Value Date   HGBA1C 5.7 (H) 07/11/2024   HGBA1C 5.8 (H) 03/16/2024   HGBA1C 6.2 06/17/2023    He was started on weekly Zepboound 2.5mg  on/about 05/19/2024 Zepbound  2.5mg  increased to 5mg  end of June 2025 07/11/24 Zep 5mg  increased to 7.5mg  09/12/24 Zep 7.5mg  increased to 10mg  Denies mass in neck, dysphagia,  dyspepsia, persistent hoarseness, abdominal pain, or N/V/C  He is also on daily Metformin  500mg - 2 tabs each morning  2. NAFLD (nonalcoholic fatty liver disease) He denies RUQ pain Narrative & Impression  CLINICAL DATA:  Hepatitis with early fibrosis.  MASLD.   EXAM: ULTRASOUND ABDOMEN LIMITED RIGHT UPPER QUADRANT   COMPARISON:  07/08/2023   FINDINGS: Gallbladder:   Gallstones: None   Sludge: None   Gallbladder Wall: Within normal limits   Pericholecystic fluid: None   Sonographic Murphy's Sign: Negative per technologist   Common bile duct:   Diameter: 3 mm   Liver:   Parenchymal echogenicity: Homogeneously increased   Contours: Normal   Lesions: None   Portal vein: Patent.  Hepatopetal flow   Other: None.   IMPRESSION: Diffuse increased echogenicity of the hepatic parenchyma is a nonspecific indicator of hepatocellular dysfunction, most commonly steatosis.   3. Vitamin D  deficiency  Latest Reference Range & Units 08/14/23 10:16 03/16/24 12:32 07/11/24 08:31  Vitamin D , 25-Hydroxy 30.0 - 100.0 ng/mL  52.0 55.5  VITD 30.00 - 100.00 ng/mL 61.69     He reports stable energy levels  Assessment/Plan:   1. Pre-diabetes (Primary) Refill - metFORMIN  (GLUCOPHAGE ) 500 MG tablet; Take 2 tablets (1,000 mg total) by mouth daily with breakfast.  Dispense: 180 tablet; Refill: 0 Check Labs - Magnesium  - Hemoglobin A1c - Insulin , random - Vitamin B12  Start Continuous Blood Glucose Monitoring system per PCP  Eat every few hours  2. NAFLD (nonalcoholic fatty liver  disease) Refill - tirzepatide  (ZEPBOUND ) 10 MG/0.5ML Pen; Inject 10 mg into the skin once a week.  Dispense: 2 mL; Refill: 0 Check Labs - Comprehensive metabolic panel with GFR  3. Vitamin D  deficiency Check Labs - VITAMIN D  25 Hydroxy (Vit-D Deficiency, Fractures)  4. Obesity, Current BMI 31.5 Refill - tirzepatide  (ZEPBOUND ) 10 MG/0.5ML Pen; Inject 10 mg into the skin once a week.  Dispense: 2  mL; Refill: 0  [name] is currently in the action stage of change. As such, his goal is to get back to weightloss efforts . He has agreed to the Category 3 Plan.   Exercise goals: Older adults should follow the adult guidelines. When older adults cannot meet the adult guidelines, they should be as physically active as their abilities and conditions will allow.  Older adults should do exercises that maintain or improve balance if they are at risk of falling.  Older adults should determine their level of effort for physical activity relative to their level of fitness.  Older adults with chronic conditions should understand whether and how their conditions affect their ability to do regular physical activity safely.  Behavioral modification strategies: increasing lean protein intake, decreasing simple carbohydrates, increasing vegetables, increasing water  intake, no skipping meals, meal planning and cooking strategies, keeping healthy foods in the home, ways to avoid boredom eating, and planning for success.  [name] has agreed to follow-up with our clinic in 4 weeks. He was informed of the importance of frequent follow-up visits to maximize his success with intensive lifestyle modifications for his multiple health conditions.   [name] was informed we would discuss his lab results at his next visit unless there is a critical issue that needs to be addressed sooner. [name] agreed to keep his next visit at the agreed upon time to discuss these results.  Objective:   Blood pressure 110/67, pulse 63, temperature 98 F (36.7 C), height 5' 11 (1.803 m), weight 226 lb (102.5 kg), SpO2 98%. Body mass index is 31.52 kg/m.  General: Cooperative, alert, well developed, in no acute distress. HEENT: Conjunctivae and lids unremarkable. Cardiovascular: Regular rhythm.  Lungs: Normal work of breathing. Neurologic: No focal deficits.   Lab Results  Component Value Date   CREATININE 0.95 07/11/2024   BUN  14 07/11/2024   NA 139 07/11/2024   K 4.8 07/11/2024   CL 104 07/11/2024   CO2 21 07/11/2024   Lab Results  Component Value Date   ALT 21 07/11/2024   AST 26 07/11/2024   ALKPHOS 70 07/11/2024   BILITOT 0.7 07/11/2024   Lab Results  Component Value Date   HGBA1C 5.7 (H) 07/11/2024   HGBA1C 5.8 (H) 03/16/2024   HGBA1C 6.2 06/17/2023   HGBA1C 5.9 (H) 02/24/2023   HGBA1C 5.7 (H) 10/23/2022   Lab Results  Component Value Date   INSULIN  26.7 (H) 07/11/2024   INSULIN  16.0 03/16/2024   INSULIN  15.6 02/24/2023   INSULIN  19.4 10/23/2022   INSULIN  15.2 06/09/2022   Lab Results  Component Value Date   TSH 1.830 07/11/2024   Lab Results  Component Value Date   CHOL 123 06/17/2023   HDL 42.90 06/17/2023   LDLCALC 65 06/17/2023   LDLDIRECT 153.1 06/09/2011   TRIG 75.0 06/17/2023   CHOLHDL 3 06/17/2023   Lab Results  Component Value Date   VD25OH 55.5 07/11/2024   VD25OH 52.0 03/16/2024   VD25OH 61.69 08/14/2023   Lab Results  Component Value Date   WBC 4.1 07/11/2024   HGB  14.0 07/11/2024   HCT 43.3 07/11/2024   MCV 92 07/11/2024   PLT 141 (L) 07/11/2024   Lab Results  Component Value Date   FERRITIN 20.6 (L) 07/21/2023   Attestation Statements:   Reviewed by clinician on day of visit: allergies, medications, problem list, medical history, surgical history, family history, social history, and previous encounter notes.  I have reviewed the above documentation for accuracy and completeness, and I agree with the above. -  Haven Pylant d. Lanijah Warzecha, NP-C

## 2024-11-08 LAB — MAGNESIUM: Magnesium: 2 mg/dL (ref 1.6–2.3)

## 2024-11-09 LAB — HEMOGLOBIN A1C
Est. average glucose Bld gHb Est-mCnc: 108 mg/dL
Hgb A1c MFr Bld: 5.4 % (ref 4.8–5.6)

## 2024-11-09 LAB — COMPREHENSIVE METABOLIC PANEL WITH GFR
ALT: 12 IU/L (ref 0–44)
AST: 19 IU/L (ref 0–40)
Albumin: 4.6 g/dL (ref 3.8–4.8)
Alkaline Phosphatase: 73 IU/L (ref 47–123)
BUN/Creatinine Ratio: 15 (ref 10–24)
BUN: 14 mg/dL (ref 8–27)
Bilirubin Total: 0.9 mg/dL (ref 0.0–1.2)
CO2: 20 mmol/L (ref 20–29)
Calcium: 10.1 mg/dL (ref 8.6–10.2)
Chloride: 104 mmol/L (ref 96–106)
Creatinine, Ser: 0.95 mg/dL (ref 0.76–1.27)
Globulin, Total: 2.6 g/dL (ref 1.5–4.5)
Glucose: 91 mg/dL (ref 70–99)
Potassium: 4.7 mmol/L (ref 3.5–5.2)
Sodium: 138 mmol/L (ref 134–144)
Total Protein: 7.2 g/dL (ref 6.0–8.5)
eGFR: 86 mL/min/1.73 (ref >59)

## 2024-11-09 LAB — VITAMIN D 25 HYDROXY (VIT D DEFICIENCY, FRACTURES): Vit D, 25-Hydroxy: 61.5 ng/mL (ref 30.0–100.0)

## 2024-11-09 LAB — INSULIN, RANDOM: INSULIN: 15.3 u[IU]/mL (ref 2.6–24.9)

## 2024-11-09 LAB — VITAMIN B12: Vitamin B-12: 717 pg/mL (ref 232–1245)

## 2024-11-11 ENCOUNTER — Other Ambulatory Visit (HOSPITAL_COMMUNITY): Payer: Self-pay

## 2024-11-11 DIAGNOSIS — H5213 Myopia, bilateral: Secondary | ICD-10-CM | POA: Diagnosis not present

## 2024-11-11 DIAGNOSIS — H31002 Unspecified chorioretinal scars, left eye: Secondary | ICD-10-CM | POA: Diagnosis not present

## 2024-11-11 DIAGNOSIS — H2513 Age-related nuclear cataract, bilateral: Secondary | ICD-10-CM | POA: Diagnosis not present

## 2024-11-16 DIAGNOSIS — Z23 Encounter for immunization: Secondary | ICD-10-CM | POA: Diagnosis not present

## 2024-11-26 ENCOUNTER — Other Ambulatory Visit: Payer: Self-pay | Admitting: Cardiology

## 2024-12-07 ENCOUNTER — Ambulatory Visit (INDEPENDENT_AMBULATORY_CARE_PROVIDER_SITE_OTHER): Admitting: Adult Health

## 2024-12-07 ENCOUNTER — Other Ambulatory Visit (HOSPITAL_COMMUNITY): Payer: Self-pay

## 2024-12-07 ENCOUNTER — Encounter (INDEPENDENT_AMBULATORY_CARE_PROVIDER_SITE_OTHER): Payer: Self-pay | Admitting: Adult Health

## 2024-12-07 VITALS — BP 116/70 | HR 58 | Temp 97.7°F | Ht 71.0 in | Wt 231.0 lb

## 2024-12-07 DIAGNOSIS — I10 Essential (primary) hypertension: Secondary | ICD-10-CM | POA: Diagnosis not present

## 2024-12-07 DIAGNOSIS — Z6832 Body mass index (BMI) 32.0-32.9, adult: Secondary | ICD-10-CM | POA: Diagnosis not present

## 2024-12-07 DIAGNOSIS — R7303 Prediabetes: Secondary | ICD-10-CM | POA: Diagnosis not present

## 2024-12-07 DIAGNOSIS — E559 Vitamin D deficiency, unspecified: Secondary | ICD-10-CM | POA: Diagnosis not present

## 2024-12-07 DIAGNOSIS — E669 Obesity, unspecified: Secondary | ICD-10-CM | POA: Diagnosis not present

## 2024-12-07 DIAGNOSIS — K760 Fatty (change of) liver, not elsewhere classified: Secondary | ICD-10-CM | POA: Diagnosis not present

## 2024-12-07 DIAGNOSIS — Z0000 Encounter for general adult medical examination without abnormal findings: Secondary | ICD-10-CM

## 2024-12-07 DIAGNOSIS — E66812 Obesity, class 2: Secondary | ICD-10-CM

## 2024-12-07 MED ORDER — METFORMIN HCL 500 MG PO TABS: 1000.0000 mg | ORAL_TABLET | Freq: Every day | ORAL | 0 refills | Status: DC

## 2024-12-07 MED ORDER — TIRZEPATIDE-WEIGHT MANAGEMENT 10 MG/0.5ML ~~LOC~~ SOAJ: 10.0000 mg | SUBCUTANEOUS | 0 refills | Status: DC

## 2024-12-07 MED ORDER — TIRZEPATIDE-WEIGHT MANAGEMENT 10 MG/0.5ML ~~LOC~~ SOAJ
10.0000 mg | SUBCUTANEOUS | 0 refills | Status: DC
  Filled 2024-12-07: qty 2, 28d supply, fill #0

## 2024-12-07 MED ORDER — METFORMIN HCL 500 MG PO TABS
1000.0000 mg | ORAL_TABLET | Freq: Every day | ORAL | 0 refills | Status: AC
  Filled 2024-12-07: qty 180, 90d supply, fill #0

## 2024-12-07 NOTE — Progress Notes (Signed)
 WEIGHT SUMMARY AND BIOMETRICS  Vitals Temp: 97.7 F (36.5 C) BP: 116/70 Pulse Rate: (!) 58 SpO2: 99 %   Anthropometric Measurements Height: 5' 11 (1.803 m) Weight: 231 lb (104.8 kg) BMI (Calculated): 32.23 Weight at Last Visit: 226 lb Weight Lost Since Last Visit: 0 Weight Gained Since Last Visit: 5 lb Starting Weight: 266 lb Total Weight Loss (lbs): 35 lb (15.9 kg)   Body Composition  Body Fat %: 34 % Fat Mass (lbs): 78.6 lbs Muscle Mass (lbs): 145.2 lbs Total Body Water  (lbs): 111.6 lbs Visceral Fat Rating : 20   Other Clinical Data Fasting: no Labs: no Today's Visit #: 8 Starting Date: 12/29/20    Chief Complaint:   OBESITY [name] is here to discuss his progress with his obesity treatment plan.  He is on the the Category 3 Plan and states he is following his eating plan approximately 50 % of the time.  He states he is exercising Swimming/Weight Lifting 60/45 minutes 5/2 times per week.  Interim History:  He was started on weekly Zepboound 2.5mg  on/about 05/19/2024 Zepbound  2.5mg  increased to 5mg  end of June 2025 07/11/24 Zep 5mg  increased to 7.5mg  09/12/24 Zep 7.5mg  increased to 10mg  Denies mass in neck, dysphagia, dyspepsia, persistent hoarseness, abdominal pain, or N/V/C   He is unsure if he can financially continue Zepbound  therapy in 2026  Reviewed Labs in EPIC  Subjective:   1. Essential hypertension Discussed Labs 11/07/2024 CMP: Electrolytes, Kidney Fx, Liver Enzymes- normal  2. Vitamin D  deficiency Discussed Labs  Latest Reference Range & Units 11/07/24 11:00  Vitamin D , 25-Hydroxy 30.0 - 100.0 ng/mL 61.5   Vit D Level at goal  3. Pre-diabetes Discussed Labs  Latest Reference Range & Units 11/07/24 11:00  Glucose 70 - 99 mg/dL 91  Hemoglobin J8R 4.8 - 5.6 % 5.4  Est. average glucose Bld gHb Est-mCnc mg/dL 891  INSULIN  2.6 - 24.9 uIU/mL 15.3    Latest Reference Range & Units 11/07/24 11:00  eGFR >59 mL/min/1.73 86   CBG,  A1c at goal GFR normal Insulin  level improved, however insulin  level is still above goal of 5 He is on weekly Zepbound  10mg  Denies mass in neck, dysphagia, dyspepsia, persistent hoarseness, abdominal pain, or N/V/C   4. NAFLD (nonalcoholic fatty liver disease) Discussed Labs  Latest Reference Range & Units 11/07/24 11:00  Alkaline Phosphatase 47 - 123 IU/L 73  Albumin 3.8 - 4.8 g/dL 4.6  AST 0 - 40 IU/L 19  ALT 0 - 44 IU/L 12   Liver Enzymes normalized  5. Healthcare maintenance Discussed Labs  Latest Reference Range & Units 11/07/24 11:00  Vitamin B12 232 - 1,245 pg/mL 717   B12 level at goal He endorses stable energy levels  Assessment/Plan:   1. Essential hypertension Continue healthy eating and regular strength training/cardiovascular exercise Continue  aspirin  81 MG chewable tablet  nitroGLYCERIN  (NITROSTAT ) 0.4 MG SL tablet  atorvastatin  (LIPITOR ) 80 MG tablet  amLODipine  (NORVASC ) 5 MG tablet  telmisartan  (MICARDIS ) 80 MG tablet  metoprolol  succinate (TOPROL -XL) 25 MG 24 hr tablet   2. Vitamin D  deficiency Continue healthy eating and regular strength training/cardiovascular exercise  3. Pre-diabetes (Primary) Continue healthy eating and regular strength training/cardiovascular exercise Refill  metFORMIN  (GLUCOPHAGE ) 500 MG tablet Take 2 tablets (1,000 mg total) by mouth daily with breakfast. Dispense: 180 tablet, Refills: 0 of 0 remaining  Refill  tirzepatide  (ZEPBOUND ) 10 MG/0.5ML Pen Inject 10 mg into the skin once a week. Dispense: 2 mL, Refills:  0 of 0 remaining   4. NAFLD (nonalcoholic fatty liver disease) Continue healthy eating and regular strength training/cardiovascular exercise  5. Healthcare maintenance Monitor Labs  6. Obesity, Current BMI 32.3 Refill  tirzepatide  (ZEPBOUND ) 10 MG/0.5ML Pen Inject 10 mg into the skin once a week. Dispense: 2 mL, Refills: 0 of 0 remaining   [name] is currently in the action stage of change. As such, his  goal is to continue with weight loss efforts. He has agreed to the Category 3 Plan.   Exercise goals: Older adults should follow the adult guidelines. When older adults cannot meet the adult guidelines, they should be as physically active as their abilities and conditions will allow.  Older adults should do exercises that maintain or improve balance if they are at risk of falling.  Older adults should determine their level of effort for physical activity relative to their level of fitness.  Older adults with chronic conditions should understand whether and how their conditions affect their ability to do regular physical activity safely.  Behavioral modification strategies: increasing lean protein intake, decreasing simple carbohydrates, increasing vegetables, increasing water  intake, no skipping meals, meal planning and cooking strategies, keeping healthy foods in the home, ways to avoid boredom eating, travel eating strategies, holiday eating strategies , and planning for success.  [name] has agreed to follow-up with our clinic in 4 weeks. He was informed of the importance of frequent follow-up visits to maximize his success with intensive lifestyle modifications for his multiple health conditions.   Objective:   Blood pressure 116/70, pulse (!) 58, temperature 97.7 F (36.5 C), height 5' 11 (1.803 m), weight 231 lb (104.8 kg), SpO2 99%. Body mass index is 32.22 kg/m.  General: Cooperative, alert, well developed, in no acute distress. HEENT: Conjunctivae and lids unremarkable. Cardiovascular: Regular rhythm.  Lungs: Normal work of breathing. Neurologic: No focal deficits.   Lab Results  Component Value Date   CREATININE 0.95 11/07/2024   BUN 14 11/07/2024   NA 138 11/07/2024   K 4.7 11/07/2024   CL 104 11/07/2024   CO2 20 11/07/2024   Lab Results  Component Value Date   ALT 12 11/07/2024   AST 19 11/07/2024   ALKPHOS 73 11/07/2024   BILITOT 0.9 11/07/2024   Lab Results   Component Value Date   HGBA1C 5.4 11/07/2024   HGBA1C 5.7 (H) 07/11/2024   HGBA1C 5.8 (H) 03/16/2024   HGBA1C 6.2 06/17/2023   HGBA1C 5.9 (H) 02/24/2023   Lab Results  Component Value Date   INSULIN  15.3 11/07/2024   INSULIN  26.7 (H) 07/11/2024   INSULIN  16.0 03/16/2024   INSULIN  15.6 02/24/2023   INSULIN  19.4 10/23/2022   Lab Results  Component Value Date   TSH 1.830 07/11/2024   Lab Results  Component Value Date   CHOL 123 06/17/2023   HDL 42.90 06/17/2023   LDLCALC 65 06/17/2023   LDLDIRECT 153.1 06/09/2011   TRIG 75.0 06/17/2023   CHOLHDL 3 06/17/2023   Lab Results  Component Value Date   VD25OH 61.5 11/07/2024   VD25OH 55.5 07/11/2024   VD25OH 52.0 03/16/2024   Lab Results  Component Value Date   WBC 4.1 07/11/2024   HGB 14.0 07/11/2024   HCT 43.3 07/11/2024   MCV 92 07/11/2024   PLT 141 (L) 07/11/2024   Lab Results  Component Value Date   FERRITIN 20.6 (L) 07/21/2023   Attestation Statements:   Reviewed by clinician on day of visit: allergies, medications, problem list, medical history, surgical history,  family history, social history, and previous encounter notes.  I have reviewed the above documentation for accuracy and completeness, and I agree with the above. -  Tiffney Haughton d. Javeon Macmurray, NP-C

## 2024-12-28 ENCOUNTER — Other Ambulatory Visit: Payer: Self-pay | Admitting: Cardiology

## 2025-01-09 ENCOUNTER — Encounter (INDEPENDENT_AMBULATORY_CARE_PROVIDER_SITE_OTHER): Payer: Self-pay | Admitting: Adult Health

## 2025-01-09 ENCOUNTER — Ambulatory Visit (INDEPENDENT_AMBULATORY_CARE_PROVIDER_SITE_OTHER): Admitting: Adult Health

## 2025-01-09 ENCOUNTER — Other Ambulatory Visit (HOSPITAL_COMMUNITY): Payer: Self-pay

## 2025-01-09 VITALS — BP 106/64 | HR 65 | Temp 97.7°F | Ht 71.0 in | Wt 227.0 lb

## 2025-01-09 DIAGNOSIS — Z6831 Body mass index (BMI) 31.0-31.9, adult: Secondary | ICD-10-CM | POA: Diagnosis not present

## 2025-01-09 DIAGNOSIS — E669 Obesity, unspecified: Secondary | ICD-10-CM

## 2025-01-09 DIAGNOSIS — I10 Essential (primary) hypertension: Secondary | ICD-10-CM | POA: Diagnosis not present

## 2025-01-09 DIAGNOSIS — Z6837 Body mass index (BMI) 37.0-37.9, adult: Secondary | ICD-10-CM

## 2025-01-09 DIAGNOSIS — E559 Vitamin D deficiency, unspecified: Secondary | ICD-10-CM

## 2025-01-09 DIAGNOSIS — K760 Fatty (change of) liver, not elsewhere classified: Secondary | ICD-10-CM | POA: Diagnosis not present

## 2025-01-09 DIAGNOSIS — R7303 Prediabetes: Secondary | ICD-10-CM | POA: Diagnosis not present

## 2025-01-09 MED ORDER — TIRZEPATIDE-WEIGHT MANAGEMENT 10 MG/0.5ML ~~LOC~~ SOAJ
10.0000 mg | SUBCUTANEOUS | 0 refills | Status: AC
  Filled 2025-01-09: qty 2, 28d supply, fill #0

## 2025-01-09 NOTE — Progress Notes (Signed)
 "    WEIGHT SUMMARY AND BIOMETRICS  Vitals Temp: 97.7 F (36.5 C) BP: 106/64 Pulse Rate: 65 SpO2: 100 %   Anthropometric Measurements Height: 5' 11 (1.803 m) Weight: 227 lb (103 kg) BMI (Calculated): 31.67 Weight at Last Visit: 231lb Weight Lost Since Last Visit: 4lb Weight Gained Since Last Visit: 0lb Starting Weight: 266lb Total Weight Loss (lbs): 39 lb (17.7 kg)   Body Composition  Body Fat %: 33.4 % Fat Mass (lbs): 75.8 lbs Muscle Mass (lbs): 143.8 lbs Total Body Water  (lbs): 104.4 lbs Visceral Fat Rating : 20   Other Clinical Data Fasting: yes Labs: No Today's Visit #: 34 Starting Date: 12/29/20    Chief Complaint:   OBESITY [name] is here to discuss his progress with his obesity treatment plan.  He is on the the Category 3 Plan and states he is following his eating plan approximately 60 % of the time.  He states he is exercising Swimming/Walking/Rowing 60 minutes 5 times per week.  Interim History:  He continues to exercise at high intensity. He has three more doses of Zepbound  10mg  at home Denies mass in neck, dysphagia, dyspepsia, persistent hoarseness, abdominal pain, or N/V/C   Zepbound  10mg  refill provided-  he is unsure of affordability and if he can continue therapy this year  Subjective:   1. Vitamin D  deficiency He endorses stable energy levels  2. Pre-diabetes He was started on weekly Zepboound 2.5mg  on/about 05/19/2024 Zepbound  2.5mg  increased to 5mg  end of June 2025 07/11/24 Zep 5mg  increased to 7.5mg  09/12/24 Zep 7.5mg  increased to 10mg  He is also on Metformin  500mg - 2 tabs with first meal Denies mass in neck, dysphagia, dyspepsia, persistent hoarseness, abdominal pain, or N/V/C   3. NAFLD (nonalcoholic fatty liver disease) He was started on weekly Zepboound 2.5mg  on/about 05/19/2024 Zepbound  2.5mg  increased to 5mg  end of June 2025 07/11/24 Zep 5mg  increased to 7.5mg  09/12/24 Zep 7.5mg  increased to 10mg   4. Essential  hypertension BP excellent at OV Home readings: SBP: 120s DBP: 60s He is on aspirin  81 MG chewable tablet  nitroGLYCERIN  (NITROSTAT ) 0.4 MG SL tablet  amLODipine  (NORVASC ) 5 MG tablet  telmisartan  (MICARDIS ) 80 MG tablet  metoprolol  succinate (TOPROL -XL) 25 MG 24 hr tablet  atorvastatin  (LIPITOR ) 80 MG tablet  tirzepatide  (ZEPBOUND ) 10 MG/0.5ML Pen    Assessment/Plan:   1. Vitamin D  deficiency Monitor Labs  2. Pre-diabetes Refill - tirzepatide  (ZEPBOUND ) 10 MG/0.5ML Pen; Inject 10 mg into the skin once a week.  Dispense: 2 mL; Refill: 0  3. NAFLD (nonalcoholic fatty liver disease) Refill - tirzepatide  (ZEPBOUND ) 10 MG/0.5ML Pen; Inject 10 mg into the skin once a week.  Dispense: 2 mL; Refill: 0  4. Essential hypertension (Primary) Refill - tirzepatide  (ZEPBOUND ) 10 MG/0.5ML Pen; Inject 10 mg into the skin once a week.  Dispense: 2 mL; Refill: 0  5. Obesity, Current BMI 31.7 Refill - tirzepatide  (ZEPBOUND ) 10 MG/0.5ML Pen; Inject 10 mg into the skin once a week.  Dispense: 2 mL; Refill: 0 Patient was counseled on the importance of maintaining healthy lifestyle habits, including balanced nutrition, regular physical activity, and behavioral modifications, while taking antiobesity medication.   Patient verbalized understanding that medication is an adjunct to, not a replacement for, lifestyle changes and that the long-term success and weight maintenance depend on continued adherence to these strategies.   [name] is currently in the action stage of change. As such, his goal is to continue with weight loss efforts. He has agreed to the Category 3  Plan.   Exercise goals: Older adults should follow the adult guidelines. When older adults cannot meet the adult guidelines, they should be as physically active as their abilities and conditions will allow.  Older adults should do exercises that maintain or improve balance if they are at risk of falling.  Older adults should determine their  level of effort for physical activity relative to their level of fitness.  Older adults with chronic conditions should understand whether and how their conditions affect their ability to do regular physical activity safely.  Behavioral modification strategies: increasing lean protein intake, decreasing simple carbohydrates, increasing vegetables, increasing water  intake, no skipping meals, meal planning and cooking strategies, keeping healthy foods in the home, ways to avoid boredom eating, and planning for success.  [name] has agreed to follow-up with our clinic in 4 weeks. He was informed of the importance of frequent follow-up visits to maximize his success with intensive lifestyle modifications for his multiple health conditions.   Objective:   Blood pressure 106/64, pulse 65, temperature 97.7 F (36.5 C), height 5' 11 (1.803 m), weight 227 lb (103 kg), SpO2 100%. Body mass index is 31.66 kg/m.  General: Cooperative, alert, well developed, in no acute distress. HEENT: Conjunctivae and lids unremarkable. Cardiovascular: Regular rhythm.  Lungs: Normal work of breathing. Neurologic: No focal deficits.   Lab Results  Component Value Date   CREATININE 0.95 11/07/2024   BUN 14 11/07/2024   NA 138 11/07/2024   K 4.7 11/07/2024   CL 104 11/07/2024   CO2 20 11/07/2024   Lab Results  Component Value Date   ALT 12 11/07/2024   AST 19 11/07/2024   ALKPHOS 73 11/07/2024   BILITOT 0.9 11/07/2024   Lab Results  Component Value Date   HGBA1C 5.4 11/07/2024   HGBA1C 5.7 (H) 07/11/2024   HGBA1C 5.8 (H) 03/16/2024   HGBA1C 6.2 06/17/2023   HGBA1C 5.9 (H) 02/24/2023   Lab Results  Component Value Date   INSULIN  15.3 11/07/2024   INSULIN  26.7 (H) 07/11/2024   INSULIN  16.0 03/16/2024   INSULIN  15.6 02/24/2023   INSULIN  19.4 10/23/2022   Lab Results  Component Value Date   TSH 1.830 07/11/2024   Lab Results  Component Value Date   CHOL 123 06/17/2023   HDL 42.90 06/17/2023    LDLCALC 65 06/17/2023   LDLDIRECT 153.1 06/09/2011   TRIG 75.0 06/17/2023   CHOLHDL 3 06/17/2023   Lab Results  Component Value Date   VD25OH 61.5 11/07/2024   VD25OH 55.5 07/11/2024   VD25OH 52.0 03/16/2024   Lab Results  Component Value Date   WBC 4.1 07/11/2024   HGB 14.0 07/11/2024   HCT 43.3 07/11/2024   MCV 92 07/11/2024   PLT 141 (L) 07/11/2024   Lab Results  Component Value Date   FERRITIN 20.6 (L) 07/21/2023   Attestation Statements:   Reviewed by clinician on day of visit: allergies, medications, problem list, medical history, surgical history, family history, social history, and previous encounter notes.  I have reviewed the above documentation for accuracy and completeness, and I agree with the above. -  Jeaneane Adamec d. Aren Cherne, NP-C "

## 2025-01-10 ENCOUNTER — Other Ambulatory Visit (HOSPITAL_COMMUNITY): Payer: Self-pay

## 2025-01-19 ENCOUNTER — Other Ambulatory Visit: Payer: Self-pay | Admitting: Cardiology

## 2025-01-20 ENCOUNTER — Other Ambulatory Visit: Payer: Self-pay | Admitting: Cardiology

## 2025-01-22 ENCOUNTER — Other Ambulatory Visit: Payer: Self-pay | Admitting: Cardiology

## 2025-01-31 ENCOUNTER — Ambulatory Visit: Admitting: Cardiology

## 2025-01-31 ENCOUNTER — Encounter: Payer: Self-pay | Admitting: Cardiology

## 2025-01-31 VITALS — BP 104/72 | HR 87 | Ht 71.0 in | Wt 231.0 lb

## 2025-01-31 DIAGNOSIS — I2510 Atherosclerotic heart disease of native coronary artery without angina pectoris: Secondary | ICD-10-CM

## 2025-01-31 DIAGNOSIS — I10 Essential (primary) hypertension: Secondary | ICD-10-CM

## 2025-01-31 DIAGNOSIS — G4733 Obstructive sleep apnea (adult) (pediatric): Secondary | ICD-10-CM | POA: Diagnosis not present

## 2025-01-31 DIAGNOSIS — Z79899 Other long term (current) drug therapy: Secondary | ICD-10-CM

## 2025-01-31 DIAGNOSIS — E7800 Pure hypercholesterolemia, unspecified: Secondary | ICD-10-CM

## 2025-01-31 LAB — LIPID PANEL
Chol/HDL Ratio: 2.1 ratio (ref 0.0–5.0)
Cholesterol, Total: 107 mg/dL (ref 100–199)
HDL: 52 mg/dL
LDL Chol Calc (NIH): 41 mg/dL (ref 0–99)
Triglycerides: 61 mg/dL (ref 0–149)
VLDL Cholesterol Cal: 14 mg/dL (ref 5–40)

## 2025-01-31 LAB — ALT: ALT: 14 [IU]/L (ref 0–44)

## 2025-01-31 NOTE — Addendum Note (Signed)
 Addended by: JANIT GENI CROME on: 01/31/2025 08:44 AM   Modules accepted: Orders

## 2025-02-06 ENCOUNTER — Ambulatory Visit (INDEPENDENT_AMBULATORY_CARE_PROVIDER_SITE_OTHER): Admitting: Adult Health

## 2025-03-15 ENCOUNTER — Ambulatory Visit

## 2025-03-16 ENCOUNTER — Ambulatory Visit
